# Patient Record
Sex: Male | Born: 1970 | Race: Black or African American | Hispanic: No | Smoking: Former smoker
Health system: Southern US, Community
[De-identification: ages and names within clinical notes are randomized; demographics above are authoritative.]

## PROBLEM LIST (undated history)

## (undated) DIAGNOSIS — D649 Anemia, unspecified: Secondary | ICD-10-CM

## (undated) DIAGNOSIS — E669 Obesity, unspecified: Secondary | ICD-10-CM

## (undated) DIAGNOSIS — Z87442 Personal history of urinary calculi: Secondary | ICD-10-CM

## (undated) DIAGNOSIS — M25559 Pain in unspecified hip: Secondary | ICD-10-CM

## (undated) DIAGNOSIS — K219 Gastro-esophageal reflux disease without esophagitis: Secondary | ICD-10-CM

## (undated) DIAGNOSIS — R7303 Prediabetes: Secondary | ICD-10-CM

## (undated) DIAGNOSIS — I10 Essential (primary) hypertension: Secondary | ICD-10-CM

## (undated) DIAGNOSIS — G4730 Sleep apnea, unspecified: Secondary | ICD-10-CM

## (undated) HISTORY — DX: Essential (primary) hypertension: I10

## (undated) HISTORY — DX: Pain in unspecified hip: M25.559

## (undated) HISTORY — DX: Obesity, unspecified: E66.9

## (undated) HISTORY — DX: Sleep apnea, unspecified: G47.30

## (undated) HISTORY — PX: FOOT SURGERY: SHX648

---

## 1998-06-24 ENCOUNTER — Emergency Department (HOSPITAL_COMMUNITY): Admission: EM | Admit: 1998-06-24 | Discharge: 1998-06-24 | Payer: Self-pay | Admitting: Emergency Medicine

## 1998-06-24 ENCOUNTER — Encounter: Payer: Self-pay | Admitting: Emergency Medicine

## 1998-10-20 ENCOUNTER — Encounter: Payer: Self-pay | Admitting: Internal Medicine

## 1998-10-20 ENCOUNTER — Emergency Department (HOSPITAL_COMMUNITY): Admission: EM | Admit: 1998-10-20 | Discharge: 1998-10-20 | Payer: Self-pay | Admitting: Internal Medicine

## 1999-03-02 ENCOUNTER — Encounter: Payer: Self-pay | Admitting: Emergency Medicine

## 1999-03-02 ENCOUNTER — Emergency Department (HOSPITAL_COMMUNITY): Admission: EM | Admit: 1999-03-02 | Discharge: 1999-03-02 | Payer: Self-pay | Admitting: Emergency Medicine

## 1999-05-17 ENCOUNTER — Emergency Department (HOSPITAL_COMMUNITY): Admission: EM | Admit: 1999-05-17 | Discharge: 1999-05-17 | Payer: Self-pay | Admitting: Emergency Medicine

## 1999-12-29 ENCOUNTER — Emergency Department (HOSPITAL_COMMUNITY): Admission: EM | Admit: 1999-12-29 | Discharge: 1999-12-29 | Payer: Self-pay | Admitting: Emergency Medicine

## 1999-12-29 ENCOUNTER — Encounter: Payer: Self-pay | Admitting: Emergency Medicine

## 2000-08-28 ENCOUNTER — Emergency Department (HOSPITAL_COMMUNITY): Admission: EM | Admit: 2000-08-28 | Discharge: 2000-08-28 | Payer: Self-pay | Admitting: Emergency Medicine

## 2001-04-03 ENCOUNTER — Emergency Department (HOSPITAL_COMMUNITY): Admission: EM | Admit: 2001-04-03 | Discharge: 2001-04-03 | Payer: Self-pay

## 2001-04-09 ENCOUNTER — Encounter: Payer: Self-pay | Admitting: Orthopedic Surgery

## 2001-04-10 ENCOUNTER — Encounter: Payer: Self-pay | Admitting: Orthopedic Surgery

## 2001-04-10 ENCOUNTER — Ambulatory Visit (HOSPITAL_COMMUNITY): Admission: RE | Admit: 2001-04-10 | Discharge: 2001-04-10 | Payer: Self-pay | Admitting: Orthopedic Surgery

## 2001-08-03 ENCOUNTER — Encounter: Payer: Self-pay | Admitting: Emergency Medicine

## 2001-08-03 ENCOUNTER — Emergency Department (HOSPITAL_COMMUNITY): Admission: EM | Admit: 2001-08-03 | Discharge: 2001-08-03 | Payer: Self-pay | Admitting: Emergency Medicine

## 2010-05-04 ENCOUNTER — Emergency Department (HOSPITAL_COMMUNITY): Admission: EM | Admit: 2010-05-04 | Discharge: 2010-05-04 | Payer: Self-pay | Admitting: Emergency Medicine

## 2010-12-02 LAB — CBC
Platelets: 208 10*3/uL (ref 150–400)
RBC: 4.25 MIL/uL (ref 4.22–5.81)
RDW: 13.5 % (ref 11.5–15.5)
WBC: 4.6 10*3/uL (ref 4.0–10.5)

## 2010-12-02 LAB — URINALYSIS, ROUTINE W REFLEX MICROSCOPIC
Bilirubin Urine: NEGATIVE
Glucose, UA: NEGATIVE mg/dL
Hgb urine dipstick: NEGATIVE
Specific Gravity, Urine: 1.021 (ref 1.005–1.030)

## 2010-12-02 LAB — URINE CULTURE

## 2010-12-02 LAB — DIFFERENTIAL
Basophils Relative: 0 % (ref 0–1)
Eosinophils Absolute: 0.1 10*3/uL (ref 0.0–0.7)
Eosinophils Relative: 3 % (ref 0–5)
Monocytes Absolute: 0.6 10*3/uL (ref 0.1–1.0)
Monocytes Relative: 13 % — ABNORMAL HIGH (ref 3–12)
Neutro Abs: 3 10*3/uL (ref 1.7–7.7)

## 2010-12-02 LAB — COMPREHENSIVE METABOLIC PANEL
ALT: 49 U/L (ref 0–53)
AST: 37 U/L (ref 0–37)
Albumin: 3.8 g/dL (ref 3.5–5.2)
Alkaline Phosphatase: 49 U/L (ref 39–117)
Chloride: 111 mEq/L (ref 96–112)
GFR calc Af Amer: >60 mL/min (ref >60)
Potassium: 3.6 mEq/L (ref 3.5–5.1)
Sodium: 142 mEq/L (ref 135–145)
Total Bilirubin: 0.5 mg/dL (ref 0.3–1.2)
Total Protein: 7 g/dL (ref 6.0–8.3)

## 2010-12-02 LAB — URINE MICROSCOPIC-ADD ON

## 2011-02-04 NOTE — Op Note (Signed)
[place]  Patient:    Michael Payne, Michael Payne                       MRN: [id number] Proc. Date: 04/10/01 Adm. Date:  [id number] Disc. Date: [id number] Attending:  Wende Mott                           Operative Report  PREOPERATIVE DIAGNOSIS: Fractured second, third, and fourth metatarsal heads.  POSTOPERATIVE DIAGNOSIS: Fractured second, third, and fourth metatarsal heads.  OPERATION/PROCEDURE: Attempted closed but open reduction and internal fixation of second, third, and fourth metatarsals, right foot.  SURGEON: Kennieth Rad, M.D.  ANESTHESIA: General.  DESCRIPTION OF PROCEDURE: The patient was taken to the operating room after being given adequate preoperative medication and was given general anesthesia and intubated.  The right foot was prepped with DuraPrep and draped in sterile manner.  A tourniquet was used for hemostasis and Bovie.  Manipulative reduction of the metatarsal heads was attempted but stabilizing them was not possible percutaneously due to the patients severely edematous foot plus large size individual.  Open reduction was then done of the second and fourth metatarsals and were held in good position with K-wires.  The third metatarsal maintained its position once the second and fourth were stabilized.  Copious irrigation with antibiotic solution was then done.  Wound closure was then done with skin staples.  A bulky compressive dressing was applied.  The patient was placed back into a Cam walker.  The patient tolerated the procedure quite well and went to the recovery room in stable and satisfactory condition.  The patient was discharged home on Percocet one q.4h p.r.n. pain, ice packs, elevation, nonweightbearing on the right side with the use of crutches, to return to the office in one week.  The patient was discharged in stable and satisfactory condition. DD:  04/26/01 TD:  04/27/01 Job: [id number]

## 2011-02-04 NOTE — H&P (Signed)
[place]  Patient:    Michael Payne, Michael Payne                       MRN: [id number] Adm. Date:  [id number] Disc. Date: [id number] Attending:  Wende Mott                         History and Physical  CHIEF COMPLAINT: Painful right foot.  HISTORY OF PRESENT ILLNESS: This is a 40 year old who had walked into an object several days ago and sustained injury, stumped his right foot.  The patient developed severe pain and discoloration of the right foot.  The patient had x-rays done at [facility]. [facility] and was found to have fractured several metatarsals.  The patient was seen in the office and found to have displaced metatarsal fractures involving the second, third, and fourth toes of the right foot.  PAST MEDICAL HISTORY: No previous operations.  History of high blood pressure.  ALLERGIES: ASPIRIN causes GI upset.  MEDICATIONS:  1. Vicodin.  2. Zestril.  SOCIAL HISTORY: Habits, the patient drinks two beers a day.  Denies smoking.  FAMILY HISTORY: Noncontributory.  REVIEW OF SYSTEMS: The patient has otherwise been healthy.  No cardiac or respiratory, no urinary or bowel symptoms.  PHYSICAL EXAMINATION:  VITAL SIGNS: Temperature 99.6 degrees, pulse 88, respirations 16, blood pressure 139/76.  Height 6 feet 4 inches.  Weight 437 pounds.  HEENT: Normocephalic.  Eyes, conjunctivae and sclerae clear.  NECK: Supple.  CHEST: Clear.  CARDIAC: S1 and S2, regular.  EXTREMITIES: Right foot diffusely swollen, ecchymotic, and tender.  Nail beds pink and blanch.  LABORATORY DATA: X-rays reveal displaced fracture of second, third, and fourth metatarsal heads.  IMPRESSION: Fractured right second, third, and fourth metatarsals of right foot. DD:  04/26/01 TD:  04/27/01 Job: [id number]

## 2012-02-03 ENCOUNTER — Encounter (HOSPITAL_COMMUNITY): Payer: Self-pay | Admitting: Emergency Medicine

## 2012-02-03 ENCOUNTER — Emergency Department (HOSPITAL_COMMUNITY): Payer: Self-pay

## 2012-02-03 ENCOUNTER — Emergency Department (HOSPITAL_COMMUNITY)
Admission: EM | Admit: 2012-02-03 | Discharge: 2012-02-03 | Disposition: A | Discharge status: Home / Self Care | Payer: Self-pay | Attending: Emergency Medicine | Admitting: Emergency Medicine

## 2012-02-03 DIAGNOSIS — S61419A Laceration without foreign body of unspecified hand, initial encounter: Secondary | ICD-10-CM

## 2012-02-03 DIAGNOSIS — S60229A Contusion of unspecified hand, initial encounter: Secondary | ICD-10-CM | POA: Insufficient documentation

## 2012-02-03 DIAGNOSIS — W540XXA Bitten by dog, initial encounter: Secondary | ICD-10-CM | POA: Insufficient documentation

## 2012-02-03 DIAGNOSIS — Y92009 Unspecified place in unspecified non-institutional (private) residence as the place of occurrence of the external cause: Secondary | ICD-10-CM | POA: Insufficient documentation

## 2012-02-03 MED ORDER — AMOXICILLIN-POT CLAVULANATE 875-125 MG PO TABS: 1.0000 | ORAL_TABLET | Freq: Two times a day (BID) | ORAL | Status: AC

## 2012-02-03 MED ORDER — AMOXICILLIN-POT CLAVULANATE 875-125 MG PO TABS
1.0000 | ORAL_TABLET | Freq: Once | ORAL | Status: AC
  Administered 2012-02-03: 1 via ORAL
  Filled 2012-02-03: qty 1

## 2012-02-03 MED ORDER — HYDROCODONE-ACETAMINOPHEN 5-500 MG PO TABS: 1.0000 | ORAL_TABLET | Freq: Four times a day (QID) | ORAL | Status: AC | PRN

## 2012-02-03 MED ORDER — TETANUS-DIPHTH-ACELL PERTUSSIS 5-2.5-18.5 LF-MCG/0.5 IM SUSP
0.5000 mL | Freq: Once | INTRAMUSCULAR | Status: AC
  Administered 2012-02-03: 0.5 mL via INTRAMUSCULAR
  Filled 2012-02-03: qty 0.5

## 2012-02-03 NOTE — ED Provider Notes (Signed)
History     CSN: [id number]  Arrival date & time 02/03/12  1517   First MD Initiated Contact with Patient 02/03/12 1610      Chief Complaint  Patient presents with  . Animal Bite    (Consider location/radiation/quality/duration/timing/severity/associated sxs/prior treatment) Patient is a 41 y.o. male presenting with animal bite. The history is provided by the patient.  Animal Bite  Pertinent negatives include no numbness and no weakness.  pt states this afternoon was bit by his own dog on his right hand. He was playing w animal, playing fetch, animal got excited and bit him. Animal otherwise has been acting at baseline, healthy, alert, playful. The dog has had all of its shots. Pt unsure of his own tetanus. C/o contusion and small < 1 cm lac to right hand. Pain mild, constant, dull, worse w palpation. No numbness/weakness or loss of use or normal function. No fever or chills. No red streaking. No drainage or bleeding from wounds. Pt is right hand dominant.  No other injury or other associated symptoms.   No past medical history on file.  No past surgical history on file.  No family history on file.  History  Substance Use Topics  . Smoking status: Never Smoker   . Smokeless tobacco: Not on file  . Alcohol Use: Yes     Weekends/beer      Review of Systems  Constitutional: Negative for fever and chills.  Skin: Positive for wound.  Neurological: Negative for weakness and numbness.    Allergies  Aspirin  Home Medications   Current Outpatient Rx  Name Route Sig Dispense Refill  . ACETAMINOPHEN 500 MG PO TABS Oral Take 1,000 mg by mouth every 6 (six) hours as needed. pain      BP 156/103  Pulse 77  Temp(Src) 98.6 F (37 C) (Oral)  Resp 20  SpO2 98%  Physical Exam  Nursing note and vitals reviewed. Constitutional: He is oriented to person, place, and time. He appears well-developed and well-nourished. No distress.  HENT:  Head: Atraumatic.  Eyes: Conjunctivae  are normal.  Neck: Neck supple. No tracheal deviation present.  Cardiovascular: Normal rate.   Pulmonary/Chest: Effort normal. No accessory muscle usage. No respiratory distress.  Musculoskeletal: Normal range of motion.       Normal cap refill distally. Radial pulse 2+  Neurological: He is alert and oriented to person, place, and time.       Right hand nvi. Normal rom, normal tendon fx.   Skin: Skin is warm and dry.    ED Course  Procedures (including critical care time)  Dg Hand Complete Right  02/03/2012  *RADIOLOGY REPORT*  Clinical Data: dog bite rule out foreign body  RIGHT HAND - COMPLETE 3+ VIEW  Comparison: None.  Findings: Three views of the right hand submitted.  No acute fracture or subluxation.  No radiopaque foreign body.  IMPRESSION: No acute fracture or subluxation.  No radiopaque foreign body.  Original Report Authenticated By: Natasha Mead, M.D.      MDM  Wound cleaned, sterile dressing.   Confirmed only allergic to asa. augmentin po. Pt verifies md has had its shots/utd. Pt unsure of his last tetanus.   Tetanus im.         Suzi Roots, MD 02/03/12 [phone]

## 2012-02-03 NOTE — Discharge Instructions (Signed)
Elevate hand as much as possible for the next 2-3 days to help with pain/swelling. Icepack/cold to sore area. Keep wounds very clean - wash with soap and water 2x/day. Take antibiotic (augmentin) as prescribed. Take motrin or aleve as need for pain. You may also take vicodin as need for pain. No driving when taking vicodin. Also, do not take tylenol or acetaminophen containing medication when taking vicodin.  Follow up with primary care doctor, or urgent care, in the next 1-2 days for wound check.  Return to ER right away if worse, worsening or severe pain, increased swelling, spreading redness, fevers, pus from wounds, other concern.   Your blood pressure is high today - follow up with primary care doctor for recheck of blood pressure in coming week.       Animal Bite An animal bite can result in a scratch on the skin, deep open cut, puncture of the skin, crush injury, or tearing away of the skin or a body part. Dogs are responsible for most animal bites. Children are bitten more often than adults. An animal bite can range from very mild to more serious. A small bite from your house pet is no cause for alarm. However, some animal bites can become infected or injure a bone or other tissue. You must seek medical care if:  The skin is broken and bleeding does not slow down or stop after 15 minutes.   The puncture is deep and difficult to clean (such as a cat bite).   Pain, warmth, redness, or pus develops around the wound.   The bite is from a stray animal or rodent. There may be a risk of rabies infection.   The bite is from a snake, raccoon, skunk, fox, coyote, or bat. There may be a risk of rabies infection.   The person bitten has a chronic illness such as diabetes, liver disease, or cancer, or the person takes medicine that lowers the immune system.   There is concern about the location and severity of the bite.  It is important to clean and protect an animal bite wound right away to  prevent infection. Follow these steps:  Clean the wound with plenty of water and soap.   Apply an antibiotic cream.   Apply gentle pressure over the wound with a clean towel or gauze to slow or stop bleeding.   Elevate the affected area above the heart to help stop any bleeding.   Seek medical care. Getting medical care within 8 hours of the animal bite leads to the best possible outcome.  DIAGNOSIS  Your caregiver will most likely:  Take a detailed history of the animal and the bite injury.   Perform a wound exam.   Take your medical history.  Blood tests or X-rays may be performed. Sometimes, infected bite wounds are cultured and sent to a lab to identify the infectious bacteria.  TREATMENT  Medical treatment will depend on the location and type of animal bite as well as the patient's medical history. Treatment may include:  Wound care, such as cleaning and flushing the wound with saline solution, bandaging, and elevating the affected area.   Antibiotics.   Tetanus immunization.   Rabies immunization.   Leaving the wound open to heal. This is often done with animal bites, due to the high risk of infection. However, in certain cases, wound closure with stitches, wound adhesive, skin adhesive strips, or staples may be used.  Infected bites that are left untreated may require  intravenous (IV) antibiotics and surgical treatment in the hospital. HOME CARE INSTRUCTIONS  Follow your caregiver's instructions for wound care.   Take all medicines as directed.   If your caregiver prescribes antibiotics, take them as directed. Finish them even if you start to feel better.   Follow up with your caregiver for further exams or immunizations as directed.  You may need a tetanus shot if:  You cannot remember when you had your last tetanus shot.   You have never had a tetanus shot.   The injury broke your skin.  If you get a tetanus shot, your arm may swell, get red, and feel warm  to the touch. This is common and not a problem. If you need a tetanus shot and you choose not to have one, there is a rare chance of getting tetanus. Sickness from tetanus can be serious. SEEK MEDICAL CARE IF:  You notice warmth, redness, soreness, swelling, pus discharge, or a bad smell coming from the wound.   You have a red line on the skin coming from the wound.   You have a fever, chills, or a general ill feeling.   You have nausea or vomiting.   You have continued or worsening pain.   You have trouble moving the injured part.   You have other questions or concerns.  MAKE SURE YOU:  Understand these instructions.   Will watch your condition.   Will get help right away if you are not doing well or get worse.  Document Released: 05/24/2011 Document Revised: 08/25/2011 Document Reviewed: 05/24/2011 [facility] Patient Information 2012 [place], [place].Animal Bite An animal bite can result in a scratch on the skin, deep open cut, puncture of the skin, crush injury, or tearing away of the skin or a body part. Dogs are responsible for most animal bites. Children are bitten more often than adults. An animal bite can range from very mild to more serious. A small bite from your house pet is no cause for alarm. However, some animal bites can become infected or injure a bone or other tissue. You must seek medical care if:  The skin is broken and bleeding does not slow down or stop after 15 minutes.   The puncture is deep and difficult to clean (such as a cat bite).   Pain, warmth, redness, or pus develops around the wound.   The bite is from a stray animal or rodent. There may be a risk of rabies infection.   The bite is from a snake, raccoon, skunk, fox, coyote, or bat. There may be a risk of rabies infection.   The person bitten has a chronic illness such as diabetes, liver disease, or cancer, or the person takes medicine that lowers the immune system.   There is concern about the  location and severity of the bite.  It is important to clean and protect an animal bite wound right away to prevent infection. Follow these steps:  Clean the wound with plenty of water and soap.   Apply an antibiotic cream.   Apply gentle pressure over the wound with a clean towel or gauze to slow or stop bleeding.   Elevate the affected area above the heart to help stop any bleeding.   Seek medical care. Getting medical care within 8 hours of the animal bite leads to the best possible outcome.  DIAGNOSIS  Your caregiver will most likely:  Take a detailed history of the animal and the bite injury.   Perform a  wound exam.   Take your medical history.  Blood tests or X-rays may be performed. Sometimes, infected bite wounds are cultured and sent to a lab to identify the infectious bacteria.  TREATMENT  Medical treatment will depend on the location and type of animal bite as well as the patient's medical history. Treatment may include:  Wound care, such as cleaning and flushing the wound with saline solution, bandaging, and elevating the affected area.   Antibiotics.   Tetanus immunization.   Rabies immunization.   Leaving the wound open to heal. This is often done with animal bites, due to the high risk of infection. However, in certain cases, wound closure with stitches, wound adhesive, skin adhesive strips, or staples may be used.  Infected bites that are left untreated may require intravenous (IV) antibiotics and surgical treatment in the hospital. HOME CARE INSTRUCTIONS  Follow your caregiver's instructions for wound care.   Take all medicines as directed.   If your caregiver prescribes antibiotics, take them as directed. Finish them even if you start to feel better.   Follow up with your caregiver for further exams or immunizations as directed.  You may need a tetanus shot if:  You cannot remember when you had your last tetanus shot.   You have never had a tetanus  shot.   The injury broke your skin.  If you get a tetanus shot, your arm may swell, get red, and feel warm to the touch. This is common and not a problem. If you need a tetanus shot and you choose not to have one, there is a rare chance of getting tetanus. Sickness from tetanus can be serious. SEEK MEDICAL CARE IF:  You notice warmth, redness, soreness, swelling, pus discharge, or a bad smell coming from the wound.   You have a red line on the skin coming from the wound.   You have a fever, chills, or a general ill feeling.   You have nausea or vomiting.   You have continued or worsening pain.   You have trouble moving the injured part.   You have other questions or concerns.  MAKE SURE YOU:  Understand these instructions.   Will watch your condition.   Will get help right away if you are not doing well or get worse.  Document Released: 05/24/2011 Document Revised: 08/25/2011 Document Reviewed: 05/24/2011 [facility] Patient Information 2012 [place], [place].     Laceration Care, Adult A laceration is a cut or lesion that goes through all layers of the skin and into the tissue just beneath the skin. TREATMENT  Some lacerations may not require closure. Some lacerations may not be able to be closed due to an increased risk of infection. It is important to see your caregiver as soon as possible after an injury to minimize the risk of infection and maximize the opportunity for successful closure. If closure is appropriate, pain medicines may be given, if needed. The wound will be cleaned to help prevent infection. Your caregiver will use stitches (sutures), staples, wound glue (adhesive), or skin adhesive strips to repair the laceration. These tools bring the skin edges together to allow for faster healing and a better cosmetic outcome. However, all wounds will heal with a scar. Once the wound has healed, scarring can be minimized by covering the wound with sunscreen during the day for 1  full year. HOME CARE INSTRUCTIONS  For sutures or staples:  Keep the wound clean and dry.   If you were given a  bandage (dressing), you should change it at least once a day. Also, change the dressing if it becomes wet or dirty, or as directed by your caregiver.   Wash the wound with soap and water 2 times a day. Rinse the wound off with water to remove all soap. Pat the wound dry with a clean towel.   After cleaning, apply a thin layer of the antibiotic ointment as recommended by your caregiver. This will help prevent infection and keep the dressing from sticking.   You may shower as usual after the first 24 hours. Do not soak the wound in water until the sutures are removed.   Only take over-the-counter or prescription medicines for pain, discomfort, or fever as directed by your caregiver.   Get your sutures or staples removed as directed by your caregiver.  For skin adhesive strips:  Keep the wound clean and dry.   Do not get the skin adhesive strips wet. You may bathe carefully, using caution to keep the wound dry.   If the wound gets wet, pat it dry with a clean towel.   Skin adhesive strips will fall off on their own. You may trim the strips as the wound heals. Do not remove skin adhesive strips that are still stuck to the wound. They will fall off in time.  For wound adhesive:  You may briefly wet your wound in the shower or bath. Do not soak or scrub the wound. Do not swim. Avoid periods of heavy perspiration until the skin adhesive has fallen off on its own. After showering or bathing, gently pat the wound dry with a clean towel.   Do not apply liquid medicine, cream medicine, or ointment medicine to your wound while the skin adhesive is in place. This may loosen the film before your wound is healed.   If a dressing is placed over the wound, be careful not to apply tape directly over the skin adhesive. This may cause the adhesive to be pulled off before the wound is healed.     Avoid prolonged exposure to sunlight or tanning lamps while the skin adhesive is in place. Exposure to ultraviolet light in the first year will darken the scar.   The skin adhesive will usually remain in place for 5 to 10 days, then naturally fall off the skin. Do not pick at the adhesive film.  You may need a tetanus shot if:  You cannot remember when you had your last tetanus shot.   You have never had a tetanus shot.  If you get a tetanus shot, your arm may swell, get red, and feel warm to the touch. This is common and not a problem. If you need a tetanus shot and you choose not to have one, there is a rare chance of getting tetanus. Sickness from tetanus can be serious. SEEK MEDICAL CARE IF:   You have redness, swelling, or increasing pain in the wound.   You see a red line that goes away from the wound.   You have yellowish-white fluid (pus) coming from the wound.   You have a fever.   You notice a bad smell coming from the wound or dressing.   Your wound breaks open before or after sutures have been removed.   You notice something coming out of the wound such as wood or glass.   Your wound is on your hand or foot and you cannot move a finger or toe.  SEEK IMMEDIATE MEDICAL CARE IF:  Your pain is not controlled with prescribed medicine.   You have severe swelling around the wound causing pain and numbness or a change in color in your arm, hand, leg, or foot.   Your wound splits open and starts bleeding.   You have worsening numbness, weakness, or loss of function of any joint around or beyond the wound.   You develop painful lumps near the wound or on the skin anywhere on your body.  MAKE SURE YOU:   Understand these instructions.   Will watch your condition.   Will get help right away if you are not doing well or get worse.  Document Released: 09/05/2005 Document Revised: 08/25/2011 Document Reviewed: 03/01/2011 [facility] Patient Information 2012 [place],  [place].    Cryotherapy Cryotherapy means treatment with cold. Ice or gel packs can be used to reduce both pain and swelling. Ice is the most helpful within the first 24 to 48 hours after an injury or flareup from overusing a muscle or joint. Sprains, strains, spasms, burning pain, shooting pain, and aches can all be eased with ice. Ice can also be used when recovering from surgery. Ice is effective, has very few side effects, and is safe for most people to use. PRECAUTIONS  Ice is not a safe treatment option for people with:  Raynaud's phenomenon. This is a condition affecting small blood vessels in the extremities. Exposure to cold may cause your problems to return.   Cold hypersensitivity. There are many forms of cold hypersensitivity, including:   Cold urticaria. Red, itchy hives appear on the skin when the tissues begin to warm after being iced.   Cold erythema. This is a red, itchy rash caused by exposure to cold.   Cold hemoglobinuria. Red blood cells break down when the tissues begin to warm after being iced. The hemoglobin that carry oxygen are passed into the urine because they cannot combine with blood proteins fast enough.   Numbness or altered sensitivity in the area being iced.  If you have any of the following conditions, do not use ice until you have discussed cryotherapy with your caregiver:  Heart conditions, such as arrhythmia, angina, or chronic heart disease.   High blood pressure.   Healing wounds or open skin in the area being iced.   Current infections.   Rheumatoid arthritis.   Poor circulation.   Diabetes.  Ice slows the blood flow in the region it is applied. This is beneficial when trying to stop inflamed tissues from spreading irritating chemicals to surrounding tissues. However, if you expose your skin to cold temperatures for too long or without the proper protection, you can damage your skin or nerves. Watch for signs of skin damage due to cold. HOME  CARE INSTRUCTIONS Follow these tips to use ice and cold packs safely.  Place a dry or damp towel between the ice and skin. A damp towel will cool the skin more quickly, so you may need to shorten the time that the ice is used.   For a more rapid response, add gentle compression to the ice.   Ice for no more than 10 to 20 minutes at a time. The bonier the area you are icing, the less time it will take to get the benefits of ice.   Check your skin after 5 minutes to make sure there are no signs of a poor response to cold or skin damage.   Rest 20 minutes or more in between uses.   Once your skin is  numb, you can end your treatment. You can test numbness by very lightly touching your skin. The touch should be so light that you do not see the skin dimple from the pressure of your fingertip. When using ice, most people will feel these normal sensations in this order: cold, burning, aching, and numbness.   Do not use ice on someone who cannot communicate their responses to pain, such as small children or people with dementia.  HOW TO MAKE AN ICE PACK Ice packs are the most common way to use ice therapy. Other methods include ice massage, ice baths, and cryo-sprays. Muscle creams that cause a cold, tingly feeling do not offer the same benefits that ice offers and should not be used as a substitute unless recommended by your caregiver. To make an ice pack, do one of the following:  Place crushed ice or a bag of frozen vegetables in a sealable plastic bag. Squeeze out the excess air. Place this bag inside another plastic bag. Slide the bag into a pillowcase or place a damp towel between your skin and the bag.   Mix 3 parts water with 1 part rubbing alcohol. Freeze the mixture in a sealable plastic bag. When you remove the mixture from the freezer, it will be slushy. Squeeze out the excess air. Place this bag inside another plastic bag. Slide the bag into a pillowcase or place a damp towel between your  skin and the bag.  SEEK MEDICAL CARE IF:  You develop white spots on your skin. This may give the skin a blotchy (mottled) appearance.   Your skin turns blue or pale.   Your skin becomes waxy or hard.   Your swelling gets worse.  MAKE SURE YOU:   Understand these instructions.   Will watch your condition.   Will get help right away if you are not doing well or get worse.  Document Released: 05/02/2011 Document Revised: 08/25/2011 Document Reviewed: 05/02/2011 [facility] Patient Information 2012 [place], [place].     Hypertension As your heart beats, it forces blood through your arteries. This force is your blood pressure. If the pressure is too high, it is called hypertension (HTN) or high blood pressure. HTN is dangerous because you may have it and not know it. High blood pressure may mean that your heart has to work harder to pump blood. Your arteries may be narrow or stiff. The extra work puts you at risk for heart disease, stroke, and other problems.  Blood pressure consists of two numbers, a higher number over a lower, 110/72, for example. It is stated as "110 over 72." The ideal is below 120 for the top number (systolic) and under 80 for the bottom (diastolic). Write down your blood pressure today. You should pay close attention to your blood pressure if you have certain conditions such as:  Heart failure.   Prior heart attack.   Diabetes   Chronic kidney disease.   Prior stroke.   Multiple risk factors for heart disease.  To see if you have HTN, your blood pressure should be measured while you are seated with your arm held at the level of the heart. It should be measured at least twice. A one-time elevated blood pressure reading (especially in the Emergency Department) does not mean that you need treatment. There may be conditions in which the blood pressure is different between your right and left arms. It is important to see your caregiver soon for a recheck. Most people  have essential hypertension  which means that there is not a specific cause. This type of high blood pressure may be lowered by changing lifestyle factors such as:  Stress.   Smoking.   Lack of exercise.   Excessive weight.   Drug/tobacco/alcohol use.   Eating less salt.  Most people do not have symptoms from high blood pressure until it has caused damage to the body. Effective treatment can often prevent, delay or reduce that damage. TREATMENT  When a cause has been identified, treatment for high blood pressure is directed at the cause. There are a large number of medications to treat HTN. These fall into several categories, and your caregiver will help you select the medicines that are best for you. Medications may have side effects. You should review side effects with your caregiver. If your blood pressure stays high after you have made lifestyle changes or started on medicines,   Your medication(s) may need to be changed.   Other problems may need to be addressed.   Be certain you understand your prescriptions, and know how and when to take your medicine.   Be sure to follow up with your caregiver within the time frame advised (usually within two weeks) to have your blood pressure rechecked and to review your medications.   If you are taking more than one medicine to lower your blood pressure, make sure you know how and at what times they should be taken. Taking two medicines at the same time can result in blood pressure that is too low.  SEEK IMMEDIATE MEDICAL CARE IF:  You develop a severe headache, blurred or changing vision, or confusion.   You have unusual weakness or numbness, or a faint feeling.   You have severe chest or abdominal pain, vomiting, or breathing problems.  MAKE SURE YOU:   Understand these instructions.   Will watch your condition.   Will get help right away if you are not doing well or get worse.  Document Released: 09/05/2005 Document Revised:  08/25/2011 Document Reviewed: 04/25/2008 [facility] Patient Information 2012 [place], [place].

## 2012-02-03 NOTE — ED Notes (Signed)
Right hand soaked in betadine soap and water x 10 min

## 2012-02-03 NOTE — ED Notes (Signed)
Bitten by his own dog today on right hand. Swollen and tender now.

## 2012-07-11 ENCOUNTER — Encounter (HOSPITAL_COMMUNITY): Payer: Self-pay | Admitting: Emergency Medicine

## 2012-07-11 ENCOUNTER — Emergency Department (HOSPITAL_COMMUNITY)
Admission: EM | Admit: 2012-07-11 | Discharge: 2012-07-11 | Disposition: A | Discharge status: Home / Self Care | Payer: Self-pay | Attending: Emergency Medicine | Admitting: Emergency Medicine

## 2012-07-11 ENCOUNTER — Emergency Department (HOSPITAL_COMMUNITY): Payer: Self-pay

## 2012-07-11 DIAGNOSIS — Z79899 Other long term (current) drug therapy: Secondary | ICD-10-CM | POA: Insufficient documentation

## 2012-07-11 DIAGNOSIS — R0789 Other chest pain: Secondary | ICD-10-CM | POA: Insufficient documentation

## 2012-07-11 DIAGNOSIS — I10 Essential (primary) hypertension: Secondary | ICD-10-CM | POA: Insufficient documentation

## 2012-07-11 DIAGNOSIS — Z87891 Personal history of nicotine dependence: Secondary | ICD-10-CM | POA: Insufficient documentation

## 2012-07-11 LAB — CBC WITH DIFFERENTIAL/PLATELET
Basophils Relative: 0 % (ref 0–1)
Eosinophils Absolute: 0.3 10*3/uL (ref 0.0–0.7)
Lymphs Abs: 2.3 10*3/uL (ref 0.7–4.0)
MCH: 31.2 pg (ref 26.0–34.0)
Neutro Abs: 4.1 10*3/uL (ref 1.7–7.7)
Neutrophils Relative %: 57 % (ref 43–77)
Platelets: 200 10*3/uL (ref 150–400)
RBC: 4.26 MIL/uL (ref 4.22–5.81)

## 2012-07-11 LAB — POCT I-STAT, CHEM 8
BUN: 13 mg/dL (ref 6–23)
Creatinine, Ser: 1 mg/dL (ref 0.50–1.35)
Glucose, Bld: 95 mg/dL (ref 70–99)
Hemoglobin: 14.3 g/dL (ref 13.0–17.0)
Potassium: 4.3 mEq/L (ref 3.5–5.1)
Sodium: 144 mEq/L (ref 135–145)

## 2012-07-11 LAB — D-DIMER, QUANTITATIVE: D-Dimer, Quant: <0.27 ug/mL-FEU (ref 0.00–0.48)

## 2012-07-11 MED ORDER — MORPHINE SULFATE 4 MG/ML IJ SOLN
6.0000 mg | Freq: Once | INTRAMUSCULAR | Status: AC
  Administered 2012-07-11: 6 mg via INTRAVENOUS
  Filled 2012-07-11: qty 2

## 2012-07-11 MED ORDER — HYDROCODONE-ACETAMINOPHEN 5-325 MG PO TABS: 2.0000 | ORAL_TABLET | ORAL | Status: DC | PRN

## 2012-07-11 MED ORDER — ASPIRIN EC 325 MG PO TBEC
325.0000 mg | DELAYED_RELEASE_TABLET | Freq: Once | ORAL | Status: DC
  Filled 2012-07-11: qty 1

## 2012-07-11 NOTE — ED Provider Notes (Signed)
History     CSN: [id number]  Arrival date & time 07/11/12  7829   First MD Initiated Contact with Patient 07/11/12 [phone]      Chief Complaint  Patient presents with  . Chest Pain    (Consider location/radiation/quality/duration/timing/severity/associated sxs/prior treatment) HPI Complains of left anterior chest pain pleuritic in quality radiating to left arm for one week. Worse with deep inspiration or changing position not by anything. Admits to shortness of breath and sweatiness no nausea no other complaint . No treatment prior to coming here pain moderate to severe present. History reviewed. No pertinent past medical history. Past medical history hypertension History reviewed. No pertinent past surgical history.  History reviewed. No pertinent family history.  History  Substance Use Topics  . Smoking status: Never Smoker   . Smokeless tobacco: Not on file  . Alcohol Use: Yes     Weekends/beer   Social history ex-smoker quit one year ago, denies alcohol admits to marijuana use denies other drug use   Review of Systems  Constitutional: Negative.   HENT: Negative.   Respiratory: Positive for shortness of breath.   Cardiovascular: Positive for chest pain.  Gastrointestinal: Negative.   Musculoskeletal: Negative.   Skin: Negative.   Neurological: Negative.   Hematological: Negative.   Psychiatric/Behavioral: Negative.   All other systems reviewed and are negative.    Allergies  Aspirin  Home Medications   Current Outpatient Rx  Name Route Sig Dispense Refill  . IBUPROFEN 200 MG PO TABS Oral Take 800 mg by mouth every 6 (six) hours as needed. For pain    . LOPERAMIDE HCL 2 MG PO CAPS Oral Take 2-4 mg by mouth 4 (four) times daily as needed.    Marland Kitchen MSM 1000 MG PO TABS Oral Take 1 tablet by mouth daily as needed. For inflammation of the knee      BP 182/116  Pulse 79  Temp 98.1 F (36.7 C) (Oral)  Resp 24  SpO2 100%  Physical Exam  Nursing note and vitals  reviewed. Constitutional: He appears well-developed and well-nourished.  HENT:  Head: Normocephalic and atraumatic.  Eyes: Conjunctivae normal are normal. Pupils are equal, round, and reactive to light.  Neck: Neck supple. No tracheal deviation present. No thyromegaly present.  Cardiovascular: Normal rate and regular rhythm.   No murmur heard. Pulmonary/Chest: Effort normal and breath sounds normal. He exhibits tenderness.       Left chest wall is tender. Pain is  reproducible by forcible abduction of left shoulder  Abdominal: Soft. Bowel sounds are normal. He exhibits no distension. There is no tenderness.       Morbidly obese  Musculoskeletal: Normal range of motion. He exhibits no edema and no tenderness.  Neurological: He is alert. Coordination normal.  Skin: Skin is warm and dry. No rash noted.  Psychiatric: He has a normal mood and affect.    ED Course  Procedures (including critical care time)   Labs Reviewed  D-DIMER, QUANTITATIVE  CBC WITH DIFFERENTIAL   No results found.   No diagnosis found.   Date: 07/11/2012  Rate: 80  Rhythm: normal sinus rhythm  QRS Axis: normal  Intervals: normal  ST/T Wave abnormalities: normal  Conduction Disutrbances:none  Narrative Interpretation:   Old EKG Reviewed: changes noted and PVCs seen on tracing from 04/09/2001 have resolved. No other changes interpreted by me  Chest x-ray reviewed by me 1:20 PM pain much improved after treatment with intravenous morphine Results for orders placed during the hospital  encounter of 07/11/12  D-DIMER, QUANTITATIVE      Component Value Range   D-Dimer, Quant <0.27  0.00 - 0.48 ug/mL-FEU  CBC WITH DIFFERENTIAL      Component Value Range   WBC 7.3  4.0 - 10.5 K/uL   RBC 4.26  4.22 - 5.81 MIL/uL   Hemoglobin 13.3  13.0 - 17.0 g/dL   HCT 62.1  30.8 - 65.7 %   MCV 94.6  78.0 - 100.0 fL   MCH 31.2  26.0 - 34.0 pg   MCHC 33.0  30.0 - 36.0 g/dL   RDW 84.6  96.2 - 95.2 %   Platelets 200  150 -  400 K/uL   Neutrophils Relative 57  43 - 77 %   Neutro Abs 4.1  1.7 - 7.7 K/uL   Lymphocytes Relative 31  12 - 46 %   Lymphs Abs 2.3  0.7 - 4.0 K/uL   Monocytes Relative 8  3 - 12 %   Monocytes Absolute 0.6  0.1 - 1.0 K/uL   Eosinophils Relative 4  0 - 5 %   Eosinophils Absolute 0.3  0.0 - 0.7 K/uL   Basophils Relative 0  0 - 1 %   Basophils Absolute 0.0  0.0 - 0.1 K/uL  POCT I-STAT TROPONIN I      Component Value Range   Troponin i, poc 0.02  0.00 - 0.08 ng/mL   Comment 3           POCT I-STAT, CHEM 8      Component Value Range   Sodium 144  135 - 145 mEq/L   Potassium 4.3  3.5 - 5.1 mEq/L   Chloride 104  96 - 112 mEq/L   BUN 13  6 - 23 mg/dL   Creatinine, Ser 8.41  0.50 - 1.35 mg/dL   Glucose, Bld 95  70 - 99 mg/dL   Calcium, Ion 3.24  4.01 - 1.23 mmol/L   TCO2 24  0 - 100 mmol/L   Hemoglobin 14.3  13.0 - 17.0 g/dL   HCT 02.7  25.3 - 66.4 %  POCT I-STAT TROPONIN I      Component Value Range   Troponin i, poc 0.03  0.00 - 0.08 ng/mL   Comment 3            Dg Chest 2 View  07/11/2012  *RADIOLOGY REPORT*  Clinical Data: Chest pain and short of breath  CHEST - 2 VIEW  Comparison: None.  Findings: Suboptimal study.  The patient is very large and was breathing on the lateral exposure causing blurring.  Mild bibasilar atelectasis.  Negative for pneumonia.  No heart failure or effusion.  IMPRESSION: Mild bibasilar atelectasis.   Original Report Authenticated By: Camelia Phenes, M.D.     MDM  Cardiac risk factors former smoker, family history mother died of MI age 59, hypertension  Pain is felt to be musculoskeletal in etiology based on today's presenting symptoms and exam Patient requires risk stratification Spoke with Dr.Bensihmon Plan prescription Vicodin [facility] will see the patient as an outpatient within the next 2 or 3 days Diagnosis atypical chest pain        Doug Sou, MD 07/11/12 1328

## 2012-07-11 NOTE — ED Notes (Signed)
Pt c/o left sided CP x 1 week intermittently with SOB; pt sts started again this am; pt sts worse with inspiration and movement

## 2012-07-26 ENCOUNTER — Encounter: Payer: Self-pay | Admitting: Cardiovascular Disease

## 2012-07-26 ENCOUNTER — Ambulatory Visit (INDEPENDENT_AMBULATORY_CARE_PROVIDER_SITE_OTHER): Payer: Self-pay | Admitting: Cardiovascular Disease

## 2012-07-26 VITALS — BP 147/93 | HR 77 | Ht 76.0 in | Wt >= 6400 oz

## 2012-07-26 DIAGNOSIS — R079 Chest pain, unspecified: Secondary | ICD-10-CM

## 2012-07-26 MED ORDER — PANTOPRAZOLE SODIUM 40 MG PO TBEC: 40.0000 mg | DELAYED_RELEASE_TABLET | Freq: Every day | ORAL | Status: DC

## 2012-07-26 NOTE — Patient Instructions (Addendum)
Your physician recommends that you schedule a follow-up appointment in:  4 weeks.   Your physician has requested that you have an echocardiogram. Echocardiography is a painless test that uses sound waves to create images of your heart. It provides your doctor with information about the size and shape of your heart and how well your heart's chambers and valves are working. This procedure takes approximately one hour. There are no restrictions for this procedure.   Your physician has recommended you make the following change in your medication:  Start Protonix 40 mg by mouth daily

## 2012-07-26 NOTE — Progress Notes (Signed)
History of Present Illness: 41 yo morbidly obese AAM with history of HTN who is here today for evaluation of chest pain. He was seen in the ED on 07/11/12 for chest pain and had a normal EKG with negative cardiac markers. He tells me that he had a sharp pain across his left chest wall one month ago. This lasts for three to four hours every time he has it. There is associated dizziness with some SOB. No nausea or diaphoresis. This has occurred several times per week for the last month. Never happens during exertion. The chest pain mostly happens at rest. No prior cardiac disease. He did smoke 1-2 ppd for 20 years but stopped smoking in June 2012.  He had been on BP medications in the past but has stopped.   Primary Care Physician: None  Past Medical History  Diagnosis Date  . HTN (hypertension)     Past Surgical History  Procedure Date  . Foot surgery     Current Outpatient Prescriptions  Medication Sig Dispense Refill  . HYDROcodone-acetaminophen (NORCO/VICODIN) 5-325 MG per tablet Take 2 tablets by mouth every 4 (four) hours as needed for pain.  16 tablet  0  . ibuprofen (ADVIL,MOTRIN) 200 MG tablet Take 800 mg by mouth every 6 (six) hours as needed. For pain      . loperamide (IMODIUM) 2 MG capsule Take 2-4 mg by mouth 4 (four) times daily as needed.      . Methylsulfonylmethane (MSM) 1000 MG TABS Take 1 tablet by mouth daily as needed. For inflammation of the knee        Allergies  Allergen Reactions  . Aspirin Other (See Comments)    Upset stomach    History   Social History  . Marital Status: Single    Spouse Name: N/A    Number of Children: 2  . 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 Smoking status: Former Smoker -- 2.0 packs/day for 22 years    Types: Cigarettes    Quit date: 02/24/2011  . Smokeless tobacco: Not on file  . Alcohol Use: Yes     Comment: Weekends/beer  . Drug Use: Yes    Special:  Marijuana  . Sexually Active: Not on file   Other Topics Concern  . Not on file   Social History Narrative  . No narrative on file    Family History  Problem Relation Age of Onset  . Heart attack Mother 65  . Lung cancer Father   . CAD Maternal Grandfather     Review of Systems:  As stated in the HPI and otherwise negative.   BP 147/93  Pulse 77  Ht 6\' 4"  (1.93 m)  Wt 457 lb (207.294 kg)  BMI 55.63 kg/m2  Physical Examination: General: Obese male  NAD HEENT: OP clear, mucus membranes moist SKIN: warm, dry. No rashes. Neuro: No focal deficits Musculoskeletal: Muscle strength 5/5 all ext Psychiatric: Mood and affect normal Neck: No JVD, no carotid bruits, no thyromegaly, no lymphadenopathy. Lungs:Clear bilaterally, no wheezes, rhonci, crackles Cardiovascular: Regular rate and rhythm. No murmurs, gallops or rubs. Abdomen:Soft. Bowel sounds present. Non-tender.  Extremities: No lower extremity edema. Pulses are 2 + in the bilateral DP/PT.  EKG: NSR, non-specific ST and T wave changes.   Assessment and Plan:   1. Chest pain; His chest pain is atypical, occurring only at rest. He does have risk  factors for CAD including tobacco abuse (40 pack years), obesity, FH of CAD, HTN. I will start by getting an echo to assess LV function. Will start Protonix 40 mg po Qdaily and see if this is related to GERD. If he continues to have chest pain over next few weeks, will need to arrange a cardiac cath. He is too large for a stress test to be beneficial. I could perform cath in lab 5 at [facility] which will support 500 lbs.

## 2012-08-03 ENCOUNTER — Ambulatory Visit (HOSPITAL_COMMUNITY): Payer: Self-pay | Attending: Cardiology

## 2012-08-03 DIAGNOSIS — I517 Cardiomegaly: Secondary | ICD-10-CM | POA: Insufficient documentation

## 2012-08-03 DIAGNOSIS — I10 Essential (primary) hypertension: Secondary | ICD-10-CM | POA: Insufficient documentation

## 2012-08-03 DIAGNOSIS — Z87891 Personal history of nicotine dependence: Secondary | ICD-10-CM | POA: Insufficient documentation

## 2012-08-03 DIAGNOSIS — Z8249 Family history of ischemic heart disease and other diseases of the circulatory system: Secondary | ICD-10-CM | POA: Insufficient documentation

## 2012-08-03 DIAGNOSIS — R079 Chest pain, unspecified: Secondary | ICD-10-CM

## 2012-08-03 DIAGNOSIS — R0989 Other specified symptoms and signs involving the circulatory and respiratory systems: Secondary | ICD-10-CM | POA: Insufficient documentation

## 2012-08-03 DIAGNOSIS — R072 Precordial pain: Secondary | ICD-10-CM | POA: Insufficient documentation

## 2012-08-03 DIAGNOSIS — R0609 Other forms of dyspnea: Secondary | ICD-10-CM | POA: Insufficient documentation

## 2012-08-03 NOTE — Progress Notes (Signed)
Echocardiogram performed.  

## 2012-09-06 ENCOUNTER — Ambulatory Visit: Payer: Self-pay | Admitting: Cardiovascular Disease

## 2012-09-27 ENCOUNTER — Ambulatory Visit: Payer: Self-pay | Admitting: Cardiovascular Disease

## 2012-10-19 ENCOUNTER — Encounter: Payer: Self-pay | Admitting: Cardiovascular Disease

## 2012-10-19 ENCOUNTER — Ambulatory Visit (INDEPENDENT_AMBULATORY_CARE_PROVIDER_SITE_OTHER): Payer: Self-pay | Admitting: Cardiovascular Disease

## 2012-10-19 VITALS — BP 116/73 | HR 92 | Ht 76.0 in | Wt >= 6400 oz

## 2012-10-19 DIAGNOSIS — R609 Edema, unspecified: Secondary | ICD-10-CM

## 2012-10-19 DIAGNOSIS — R079 Chest pain, unspecified: Secondary | ICD-10-CM

## 2012-10-19 DIAGNOSIS — R600 Localized edema: Secondary | ICD-10-CM

## 2012-10-19 LAB — BASIC METABOLIC PANEL
CO2: 30 mEq/L (ref 19–32)
Chloride: 103 mEq/L (ref 96–112)
GFR: 104.65 mL/min (ref >60.00)
Glucose, Bld: 122 mg/dL — ABNORMAL HIGH (ref 70–99)
Potassium: 3.4 mEq/L — ABNORMAL LOW (ref 3.5–5.1)
Sodium: 139 mEq/L (ref 135–145)

## 2012-10-19 MED ORDER — FUROSEMIDE 40 MG PO TABS: 40.0000 mg | ORAL_TABLET | Freq: Every day | ORAL | Status: DC | PRN

## 2012-10-19 MED ORDER — FAMOTIDINE 20 MG PO TABS: 20.0000 mg | ORAL_TABLET | Freq: Every day | ORAL | Status: DC

## 2012-10-19 NOTE — Progress Notes (Signed)
History of Present Illness: 42 yo morbidly obese AAM with history of HTN and chest pain who is here today for cardiac follow up. I saw him in November 2013 for evaluation of chest pain. He was seen in the ED on 07/11/12 for chest pain and had a normal EKG with negative cardiac markers. He told me that he had a sharp pain across his left chest wall one month prior to his first visit here. This lasted for three to four hours. There was associated dizziness with some SOB. No nausea or diaphoresis. This occurred several times over the prior week.  Never happens during exertion. The chest pain mostly happens at rest. No prior cardiac disease. He did smoke 1-2 ppd for 20 years but stopped smoking in June 2012. He had been on BP medications in the past but has stopped. I arranged an echo which showed normal LV function with mild dilation of the aortic root. I also asked him to start Protonix for possible GERD.   He is here today for follow up. He did not take the Protonix. His chest pain has mostly resolved. No SOB. Some lower extremity edema at the end of the day.   Primary Care Physician: None  Past Medical History  Diagnosis Date  . HTN (hypertension)     Past Surgical History  Procedure Date  . Foot surgery     Current Outpatient Prescriptions  Medication Sig Dispense Refill  . HYDROcodone-acetaminophen (NORCO/VICODIN) 5-325 MG per tablet Take 2 tablets by mouth every 4 (four) hours as needed for pain.  16 tablet  0  . ibuprofen (ADVIL,MOTRIN) 200 MG tablet Take 800 mg by mouth every 6 (six) hours as needed. For pain      . loperamide (IMODIUM) 2 MG capsule Take 2-4 mg by mouth 4 (four) times daily as needed.      . Methylsulfonylmethane (MSM) 1000 MG TABS Take 1 tablet by mouth daily as needed. For inflammation of the knee      . pantoprazole (PROTONIX) 40 MG tablet Take 1 tablet (40 mg total) by mouth daily.  30 tablet  6    Allergies  Allergen Reactions  . Aspirin Other (See Comments)      Upset stomach    History   Social History  . Marital Status: Single    Spouse Name: N/A    Number of Children: 2  . 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 Smoking status: Former Smoker -- 2.0 packs/day for 22 years    Types: Cigarettes    Quit date: 02/24/2011  . Smokeless tobacco: Not on file  . Alcohol Use: Yes     Comment: Weekends/beer  . Drug Use: Yes    Special: Marijuana  . Sexually Active: Not on file   Other Topics Concern  . Not on file   Social History Narrative  . No narrative on file    Family History  Problem Relation Age of Onset  . Heart attack Mother 48  . Lung cancer Father   . CAD Maternal Grandfather     Review of Systems:  As stated in the HPI and otherwise negative.   BP 116/73  Pulse 92  Ht 6\' 4"  (1.93 m)  Wt 454 lb (205.933 kg)  BMI 55.26 kg/m2  Physical Examination: General: Well developed, well nourished, NAD HEENT: OP clear, mucus membranes moist SKIN: warm, dry. No rashes.  Neuro: No focal deficits Musculoskeletal: Muscle strength 5/5 all ext Psychiatric: Mood and affect normal Neck: No JVD, no carotid bruits, no thyromegaly, no lymphadenopathy. Lungs:Clear bilaterally, no wheezes, rhonci, crackles Cardiovascular: Regular rate and rhythm. No murmurs, gallops or rubs. Abdomen:Soft. Bowel sounds present. Non-tender.  Extremities: Trace bilateral lower extremity edema. Pulses are 2 + in the bilateral DP/PT.  Echo November 2013: Left ventricle: The cavity size was mildly dilated. Wall thickness was increased in a pattern of mild LVH. Systolic function was normal. The estimated ejection fraction was in the range of 55% to 60%. Wall motion was normal; there were no regional wall motion abnormalities. - Aortic root: The aortic root was mildly to moderately dilated. - Ascending aorta: The ascending aorta was mildly dilated. - Left atrium: The atrium was mildly  dilated.  Assessment and Plan:   1. Chest pain:  His chest pain is atypical, occurring only at rest. Chest pain has mostly resolved. He is asked to start Pepcid 20 mg po Qdaily for possible GERD  2. Lower ext edema: Likely dependent edema. Will start Lasix 40 mg po Qdaily as needed per pt request. Check BMET today.   F/U 6 months unless doing well.    will need to arrange a cardiac cath. He is too large for a stress test to be beneficial. I could perform cath in lab 5 at [facility] which will support 500 lbs.

## 2012-10-19 NOTE — Patient Instructions (Addendum)
Your physician wants you to follow-up in:  6 months. You will receive a reminder letter in the mail two months in advance. If you don't receive a letter, please call our office to schedule the follow-up appointment.  Your physician has recommended you make the following change in your medication:  Start Pepcid 20 mg by mouth daily.  You do not need a prescription for this. It can be bought over the counter. Start furosemide 40 mg by mouth daily as needed for swelling.  Eat a banana on the days you take the furosemide.

## 2013-09-23 ENCOUNTER — Ambulatory Visit (INDEPENDENT_AMBULATORY_CARE_PROVIDER_SITE_OTHER): Payer: BC Managed Care – PPO | Admitting: Physician Assistant

## 2013-09-23 VITALS — BP 154/94 | HR 71 | Temp 98.5°F | Resp 18 | Ht 74.5 in | Wt >= 6400 oz

## 2013-09-23 DIAGNOSIS — Z202 Contact with and (suspected) exposure to infections with a predominantly sexual mode of transmission: Secondary | ICD-10-CM

## 2013-09-23 DIAGNOSIS — I10 Essential (primary) hypertension: Secondary | ICD-10-CM

## 2013-09-23 DIAGNOSIS — R631 Polydipsia: Secondary | ICD-10-CM

## 2013-09-23 DIAGNOSIS — Z2089 Contact with and (suspected) exposure to other communicable diseases: Secondary | ICD-10-CM

## 2013-09-23 LAB — COMPREHENSIVE METABOLIC PANEL
ALBUMIN: 4 g/dL (ref 3.5–5.2)
ALT: 30 U/L (ref 0–53)
AST: 20 U/L (ref 0–37)
Alkaline Phosphatase: 60 U/L (ref 39–117)
BUN: 12 mg/dL (ref 6–23)
CALCIUM: 9 mg/dL (ref 8.4–10.5)
CHLORIDE: 101 meq/L (ref 96–112)
CO2: 31 mEq/L (ref 19–32)
CREATININE: 0.87 mg/dL (ref 0.50–1.35)
GLUCOSE: 89 mg/dL (ref 70–99)
POTASSIUM: 4.4 meq/L (ref 3.5–5.3)
Sodium: 139 mEq/L (ref 135–145)
Total Bilirubin: 0.5 mg/dL (ref 0.3–1.2)
Total Protein: 7.2 g/dL (ref 6.0–8.3)

## 2013-09-23 LAB — RPR

## 2013-09-23 LAB — POCT URINALYSIS DIPSTICK
BILIRUBIN UA: NEGATIVE
GLUCOSE UA: NEGATIVE
Ketones, UA: NEGATIVE
Leukocytes, UA: NEGATIVE
NITRITE UA: NEGATIVE
PH UA: 6
Protein, UA: NEGATIVE
SPEC GRAV UA: 1.02
Urobilinogen, UA: 0.2

## 2013-09-23 LAB — POCT CBC
Granulocyte percent: 54.8 %G (ref 37–80)
HEMATOCRIT: 43.2 % — AB (ref 43.5–53.7)
HEMOGLOBIN: 13.1 g/dL — AB (ref 14.1–18.1)
LYMPH, POC: 1.8 (ref 0.6–3.4)
MCH: 30 pg (ref 27–31.2)
MCHC: 30.3 g/dL — AB (ref 31.8–35.4)
MCV: 99.1 fL — AB (ref 80–97)
MID (cbc): 0.5 (ref 0–0.9)
MPV: 8.8 fL (ref 0–99.8)
POC Granulocyte: 2.9 (ref 2–6.9)
POC LYMPH PERCENT: 34.9 %L (ref 10–50)
POC MID %: 10.3 %M (ref 0–12)
Platelet Count, POC: 199 10*3/uL (ref 142–424)
RBC: 4.36 M/uL — AB (ref 4.69–6.13)
RDW, POC: 13.6 %
WBC: 5.3 10*3/uL (ref 4.6–10.2)

## 2013-09-23 LAB — POCT UA - MICROSCOPIC ONLY
BACTERIA, U MICROSCOPIC: NEGATIVE
CASTS, UR, LPF, POC: NEGATIVE
Crystals, Ur, HPF, POC: NEGATIVE
MUCUS UA: NEGATIVE
RBC, urine, microscopic: NEGATIVE
YEAST UA: NEGATIVE

## 2013-09-23 LAB — HIV ANTIBODY (ROUTINE TESTING W REFLEX): HIV: NONREACTIVE

## 2013-09-23 LAB — LIPID PANEL
CHOL/HDL RATIO: 2.9 ratio
Cholesterol: 145 mg/dL (ref 0–200)
HDL: 50 mg/dL (ref >39)
LDL CALC: 73 mg/dL (ref 0–99)
TRIGLYCERIDES: 108 mg/dL (ref <150)
VLDL: 22 mg/dL (ref 0–40)

## 2013-09-23 LAB — GLUCOSE, POCT (MANUAL RESULT ENTRY): POC GLUCOSE: 84 mg/dL (ref 70–99)

## 2013-09-23 LAB — POCT GLYCOSYLATED HEMOGLOBIN (HGB A1C): HEMOGLOBIN A1C: 5.2

## 2013-09-23 MED ORDER — METRONIDAZOLE 500 MG PO TABS: ORAL_TABLET | ORAL | Status: DC

## 2013-09-23 MED ORDER — HYDROCHLOROTHIAZIDE 12.5 MG PO CAPS: 12.5000 mg | ORAL_CAPSULE | ORAL | Status: DC

## 2013-09-23 NOTE — Progress Notes (Signed)
Subjective:    Patient ID: Michael Payne, male    DOB: [birth year], 43 y.o.   MRN: [id number]   HPI 43 year old male here requesting treatment for trichomonas. His wife was diagnosed with this 1 week ago. Patient in stable relationship with her for 6 years and they have been married for 1.5 years. States he has been faithful. States that she has told him that she has been faithful. He was sexually active with a male prior to this relationship and did not always use protection. He has noted some urinary frequency and urinary urgency the past several days. No dysuria. No penile discharge. No lesions along the penis, testicles, or groin. He did have crabs many years ago.   He was previously on antihypertensives around 8 years ago, but was taken off of them when he lost some weight. He has been under increased amounts of stress lately with the above. Some headaches. No chest pain, visual changes, or focal deficits.    PMH: Past Medical History  Diagnosis Date  . HTN (hypertension)    Allergies: Allergies  Allergen Reactions  . Aspirin Other (See Comments)    Upset stomach   History   Social History  . Marital Status: Married    Spouse Name: N/A    Number of Children: 2  . 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 Smoking status: Former Smoker -- 2.00 packs/day for 22 years    Types: Cigarettes    Quit date: 02/24/2011  . Smokeless tobacco: Not on file  . Alcohol Use: Yes     Comment: Weekends/beer  . Drug Use: Yes    Special: Marijuana  . Sexual Activity: Not on file   Other Topics Concern  . Not on file   Social History Narrative  . No narrative on file   Family History  Problem Relation Age of Onset  . Heart attack Mother 35  . Lung cancer Father   . CAD Maternal Grandfather      Review of Systems  Constitutional: Negative for fever and chills.  Cardiovascular: Negative for chest pain.    Gastrointestinal: Negative for nausea, vomiting, abdominal pain, diarrhea and constipation.  Endocrine: Positive for polydipsia and polyuria.       Positive for nocturia.   Genitourinary: Positive for urgency and frequency. Negative for dysuria, hematuria, flank pain, decreased urine volume, discharge, penile swelling, scrotal swelling, enuresis, difficulty urinating, genital sores, penile pain and testicular pain.  Neurological: Positive for headaches.       Objective:   Physical Exam  Physical Exam: Blood pressure 174/118, pulse 71, temperature 98.5 F (36.9 C), temperature source Oral, resp. rate 18, height 6' 2.5" (1.892 m), weight 456 lb (206.84 kg), SpO2 98.00%., Body mass index is 57.78 kg/(m^2). General: Well developed, well nourished, in no acute distress. Head: Normocephalic, atraumatic, eyes without discharge, sclera non-icteric, nares are without discharge. Bilateral auditory canals clear, increased cerumen right side. TM's are without perforation, pearly grey and translucent with reflective cone of light bilaterally. Oral cavity moist, posterior pharynx without exudate, erythema, peritonsillar abscess, or post nasal drip. Uvula midline.   Neck: Supple. No thyromegaly. Full ROM. No lymphadenopathy. Lungs: Clear bilaterally to auscultation without wheezes, rales, or rhonchi. Breathing is unlabored. Heart: RRR with S1 S2. No murmurs, rubs, or gallops appreciated. Abdomen: Soft, non-tender. No CVA TTP bilaterally.  Msk:  Strength and tone normal  for age. Extremities/Skin: Warm and dry. No clubbing or cyanosis. No edema. No rashes or suspicious lesions. Neuro: Alert and oriented X 3. Moves all extremities spontaneously. Gait is normal. CNII-XII grossly in tact. Psych:  Responds to questions appropriately with a normal affect.    Labs: Results for orders placed in visit on 09/23/13  POCT CBC      Result Value Range   WBC 5.3  4.6 - 10.2 K/uL   Lymph, poc 1.8  0.6 - 3.4   POC  LYMPH PERCENT 34.9  10 - 50 %L   MID (cbc) 0.5  0 - 0.9   POC MID % 10.3  0 - 12 %M   POC Granulocyte 2.9  2 - 6.9   Granulocyte percent 54.8  37 - 80 %G   RBC 4.36 (*) 4.69 - 6.13 M/uL   Hemoglobin 13.1 (*) 14.1 - 18.1 g/dL   HCT, POC 66.4 (*) 40.3 - 53.7 %   MCV 99.1 (*) 80 - 97 fL   MCH, POC 30.0  27 - 31.2 pg   MCHC 30.3 (*) 31.8 - 35.4 g/dL   RDW, POC 47.4     Platelet Count, POC 199  142 - 424 K/uL   MPV 8.8  0 - 99.8 fL  POCT UA - MICROSCOPIC ONLY      Result Value Range   WBC, Ur, HPF, POC 0-2     RBC, urine, microscopic neg     Bacteria, U Microscopic neg     Mucus, UA neg     Epithelial cells, urine per micros 0-1     Crystals, Ur, HPF, POC neg     Casts, Ur, LPF, POC neg     Yeast, UA neg    POCT URINALYSIS DIPSTICK      Result Value Range   Color, UA yellow     Clarity, UA clear     Glucose, UA neg     Bilirubin, UA neg     Ketones, UA neg     Spec Grav, UA 1.020     Blood, UA trace-intact     pH, UA 6.0     Protein, UA neg     Urobilinogen, UA 0.2     Nitrite, UA neg     Leukocytes, UA Negative    GLUCOSE, POCT (MANUAL RESULT ENTRY)      Result Value Range   POC Glucose 84  70 - 99 mg/dl  POCT GLYCOSYLATED HEMOGLOBIN (HGB A1C)      Result Value Range   Hemoglobin A1C 5.2     CMP, lipid, TSH, HIV, RPR, HSV 1&2, and uriprobe all pending     Assessment & Plan:  43 year old male with hypertension, obesity, urinary frequency, urinary urgency, and exposure to STD  1) Hypertension -Start HCTZ 12.5 mg 1 po daily #30 RF 1 -Some of this is likely to be stress induced from the above -Healthy diet -Start a walking program -Weight loss -He has a CPE scheduled on 10/14/13  2) Exposure to STD/Urinary frequency/Urinary urgency  -Flagyl 500 mg Take all 4 po at once #4 no RF, do not drink alcohol with this medication  -Discussed trichomonas in detail  -Await remaining labs -Safe sex practices   3) Anemia -Stable -Follow up at CPE   Eula Listen,  PA-C [facility] [address] [place], [place] [address] [phone] 09/23/2013 10:25 AM

## 2013-09-24 LAB — GC/CHLAMYDIA PROBE AMP
CT PROBE, AMP APTIMA: NEGATIVE
GC PROBE AMP APTIMA: NEGATIVE

## 2013-09-24 LAB — TSH: TSH: 2.563 u[IU]/mL (ref 0.350–4.500)

## 2013-09-25 LAB — HSV(HERPES SIMPLEX VRS) I + II AB-IGG
HSV 1 GLYCOPROTEIN G AB, IGG: 11.68 IV — AB
HSV 2 Glycoprotein G Ab, IgG: 0.12 IV

## 2013-10-14 ENCOUNTER — Ambulatory Visit (INDEPENDENT_AMBULATORY_CARE_PROVIDER_SITE_OTHER): Payer: BC Managed Care – PPO | Admitting: Family Medicine

## 2013-10-14 ENCOUNTER — Encounter: Payer: Self-pay | Admitting: Family Medicine

## 2013-10-14 ENCOUNTER — Ambulatory Visit: Payer: BC Managed Care – PPO

## 2013-10-14 DIAGNOSIS — M25561 Pain in right knee: Secondary | ICD-10-CM

## 2013-10-14 DIAGNOSIS — M25569 Pain in unspecified knee: Secondary | ICD-10-CM

## 2013-10-14 DIAGNOSIS — Z125 Encounter for screening for malignant neoplasm of prostate: Secondary | ICD-10-CM

## 2013-10-14 DIAGNOSIS — I10 Essential (primary) hypertension: Secondary | ICD-10-CM

## 2013-10-14 LAB — BASIC METABOLIC PANEL
BUN: 12 mg/dL (ref 6–23)
CALCIUM: 9.3 mg/dL (ref 8.4–10.5)
CHLORIDE: 104 meq/L (ref 96–112)
CO2: 29 mEq/L (ref 19–32)
CREATININE: 1 mg/dL (ref 0.50–1.35)
Glucose, Bld: 94 mg/dL (ref 70–99)
Potassium: 4.6 mEq/L (ref 3.5–5.3)
Sodium: 141 mEq/L (ref 135–145)

## 2013-10-14 LAB — PSA: PSA: 0.44 ng/mL (ref <=4.00)

## 2013-10-14 NOTE — Patient Instructions (Addendum)
Please look into the bariatric program at [facility].  They offer information seminars frequently.  [email or web address] [phone]  We need to treat your blood pressure.  Your high blood pressure and obesity put you at high risk for a heart attack or stroke.  Please try to take your medication at a regular time each day.  Doing so will minimize frequent urination. Please come and see [place] in about 2 weeks to recheck your BP while you are taking your medication.   We will likely need to increase your dose or add a second medication to control your BP.    I will be in touch with the rest of your labs.

## 2013-10-14 NOTE — Progress Notes (Signed)
[facility] [address] [place] [address] [id number] [phone]  Date:  10/14/2013   Name:  Michael Payne   DOB:  [birth year]   MRN:  [id number]  PCP:  No primary provider on file.    Chief Complaint: Annual Exam   History of Present Illness:  STONE SPIRITO Payne is a 43 y.o. very pleasant male patient who presents with the following:  Here today for a CPE.  Seen a couple of weeks ago by Eula Listen, PA-C and started back on BP medication.   He admits he has not been taking his BP medication- has not taken in the last 2 days.  He did not check his BP while he was on the medication.  He states he did not like taking the HCTZ due to increased urination   He was seen about one year ago at [facility] cardiology.  He was supposed to have a cath per Dr. Weldon Picking last note but this never did happen.  Per Sulayman's report he was told that he did not need to have the cath after all.  His mother did die of a "massive heart attack" when she was 40.    He has not had any more CP.   He is aware that his weight is a very big problem.  Deondray has tried diet and exercise.  However so far these measures have not seemed to help. He would be interested in finding out more about bariatric sugery options  Of note he plans to attend truck driving school.  He is aware that he cannot drive until his BP is controlled  A couple of years ago he fell and hurt his right knee.  It has continued to hurt over the last couple of years. He never had an x-ray performed.    Patient Active Problem List   Diagnosis Date Noted  . Morbid obesity 10/14/2013  . HTN (hypertension) 10/14/2013  . Chest pain 07/26/2012    Past Medical History  Diagnosis Date  . HTN (hypertension)     Past Surgical History  Procedure Laterality Date  . Foot surgery      History  Substance Use Topics  . Smoking status: Former Smoker -- 2.00 packs/day for 22 years    Types: Cigarettes    Quit date: 02/24/2011  .  Smokeless tobacco: Not on file  . Alcohol Use: Yes     Comment: Weekends/beer    Family History  Problem Relation Age of Onset  . Heart attack Mother 30  . Lung cancer Father   . CAD Maternal Grandfather     Allergies  Allergen Reactions  . Aspirin Other (See Comments)    Upset stomach    Medication list has been reviewed and updated.  Current Outpatient Prescriptions on File Prior to Visit  Medication Sig Dispense Refill  . hydrochlorothiazide (MICROZIDE) 12.5 MG capsule Take 1 capsule (12.5 mg total) by mouth every morning.  30 capsule  1  . metroNIDAZOLE (FLAGYL) 500 MG tablet Take all 4 tabs at once  4 tablet  0   No current facility-administered medications on file prior to visit.    Review of Systems:  As per HPI- otherwise negative.   Physical Examination: Filed Vitals:   10/14/13 0913  BP: 162/114  Pulse: 74  Temp: 98 F (36.7 C)  Resp: 16   Filed Vitals:   10/14/13 0913  Height: 6' 1.5" (1.867 m)  Weight: 463 lb (210.015 kg)  Body mass index is 60.25 kg/(m^2). Ideal Body Weight: Weight in (lb) to have BMI = 25: 191.7  GEN: WDWN, NAD, Non-toxic, A & O x 3, morbid obesity HEENT: Atraumatic, Normocephalic. Neck supple. No masses, No LAD.  Bilateral TM wnl, oropharynx normal.  PEERL,EOMI.   Ears and Nose: No external deformity. CV: RRR, No M/G/R. No JVD. No thrill. No extra heart sounds. PULM: CTA B, no wheezes, crackles, rhonchi. No retractions. No resp. distress. No accessory muscle use. ABD: S, NT, ND. No rebound. No HSM. EXTR: No c/c/e NEURO Normal gait.  PSYCH: Normally interactive. Conversant. Not depressed or anxious appearing.  Calm demeanor.  Gu: penis hidden by pannus.  No apparent testicular masses.  Normal rectal exam, prostate exam limited by obesity.   Right knee: no apparent effusion, normal flexion and extension. Stable, no heat, swelling or redness  [facility] reading (PRIMARY) by  Dr. Patsy Lager. Right knee: negative  Assessment and  Plan: Morbid obesity  HTN (hypertension) - Plan: Basic metabolic panel  Right knee pain - Plan: DG Knee Complete 4 Views Right  Screening for prostate cancer - Plan: PSA  Explained that his uncontrolled HTN is dangerous to his health.  Encouraged him to take his medication at around the same time every day to reduce increased urination.  Knee pain likely due to obesity.  He does want to lose weight and we hope this will help  See patient instructions for more details.    Signed Abbe Amsterdam, MD

## 2013-11-30 ENCOUNTER — Other Ambulatory Visit: Payer: Self-pay | Admitting: Physician Assistant

## 2014-06-11 ENCOUNTER — Other Ambulatory Visit: Payer: Self-pay | Admitting: Family Medicine

## 2014-07-20 ENCOUNTER — Other Ambulatory Visit: Payer: Self-pay | Admitting: Physician Assistant

## 2014-09-20 ENCOUNTER — Other Ambulatory Visit: Payer: Self-pay | Admitting: Physician Assistant

## 2014-09-22 NOTE — Telephone Encounter (Signed)
Called pt and advised he is overdue for check-up. 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 I told pt that I will give him 2 more weeks RF if he can get in for OV w/in that time, and pt agreed to try to do that.

## 2014-09-30 ENCOUNTER — Emergency Department (HOSPITAL_COMMUNITY): Payer: 59

## 2014-09-30 ENCOUNTER — Emergency Department (HOSPITAL_COMMUNITY)
Admission: EM | Admit: 2014-09-30 | Discharge: 2014-09-30 | Disposition: A | Discharge status: Home / Self Care | Payer: 59 | Attending: Emergency Medicine | Admitting: Emergency Medicine

## 2014-09-30 ENCOUNTER — Encounter (HOSPITAL_COMMUNITY): Payer: Self-pay | Admitting: *Deleted

## 2014-09-30 DIAGNOSIS — Y9289 Other specified places as the place of occurrence of the external cause: Secondary | ICD-10-CM | POA: Insufficient documentation

## 2014-09-30 DIAGNOSIS — X58XXXA Exposure to other specified factors, initial encounter: Secondary | ICD-10-CM | POA: Insufficient documentation

## 2014-09-30 DIAGNOSIS — Y998 Other external cause status: Secondary | ICD-10-CM | POA: Insufficient documentation

## 2014-09-30 DIAGNOSIS — S8992XA Unspecified injury of left lower leg, initial encounter: Secondary | ICD-10-CM | POA: Diagnosis not present

## 2014-09-30 DIAGNOSIS — Z87891 Personal history of nicotine dependence: Secondary | ICD-10-CM | POA: Diagnosis not present

## 2014-09-30 DIAGNOSIS — I10 Essential (primary) hypertension: Secondary | ICD-10-CM | POA: Insufficient documentation

## 2014-09-30 DIAGNOSIS — Y9339 Activity, other involving climbing, rappelling and jumping off: Secondary | ICD-10-CM | POA: Insufficient documentation

## 2014-09-30 DIAGNOSIS — T1490XA Injury, unspecified, initial encounter: Secondary | ICD-10-CM

## 2014-09-30 DIAGNOSIS — M25562 Pain in left knee: Secondary | ICD-10-CM

## 2014-09-30 DIAGNOSIS — Z79899 Other long term (current) drug therapy: Secondary | ICD-10-CM | POA: Insufficient documentation

## 2014-09-30 MED ORDER — HYDROCODONE-ACETAMINOPHEN 5-325 MG PO TABS: 2.0000 | ORAL_TABLET | ORAL | Status: DC | PRN

## 2014-09-30 MED ORDER — HYDROCODONE-ACETAMINOPHEN 5-325 MG PO TABS
2.0000 | ORAL_TABLET | Freq: Once | ORAL | Status: AC
  Administered 2014-09-30: 2 via ORAL
  Filled 2014-09-30: qty 2

## 2014-09-30 NOTE — Progress Notes (Signed)
Orthopedic Tech Progress Note Patient Details:  Michael Payne [birth year] [id number] Applied ACE bandage to Lt. knee.  Pulses, sensation, motion intact before and after application.  Capillary refill less than 2 seconds before and after application. Ortho Devices Type of Ortho Device: Ace wrap Ortho Device/Splint Location: LLE Ortho Device/Splint Interventions: Application   Lesle Chris 09/30/2014, 10:07 PM

## 2014-09-30 NOTE — ED Provider Notes (Signed)
CSN: [id number]     Arrival date & time 09/30/14  1848 History   None    This chart was scribed for non-physician practitioner, Joycie Peek PA-C working with Toy Cookey, MD by Arlan Organ, ED Scribe. This patient was seen in room TR09C/TR09C and the patient's care was started at 8:06 PM.   Chief Complaint  Patient presents with  . Knee Pain   The history is provided by the patient. No language interpreter was used.    HPI Comments: Michael Payne is a 44 y.o. male with a PMHx of HTN who presents to the Emergency Department complaining of constant, moderate, non-radiating L knee pain with associated swelling onset prior to arrival. Pt states he was climbing into a truck when he heard a "pop" in his L knee. Pt states he is unable to bear weight on the L leg secondary to pain. Pain is described as throbbing. Wife also states R leg typically swells at baseline. Discomfort is exacerbated with movement, weight bearing, and ambulation. No alleviating factors at this time. He has not tried any OTC medications or home remedies to help manage symptoms. No recent fever or chills. Pt with known allergy to Aspirin.   Past Medical History  Diagnosis Date  . HTN (hypertension)    Past Surgical History  Procedure Laterality Date  . Foot surgery     Family History  Problem Relation Age of Onset  . Heart attack Mother 40  . Lung cancer Father   . CAD Maternal Grandfather    History  Substance Use Topics  . Smoking status: Former Smoker -- 2.00 packs/day for 22 years    Types: Cigarettes    Quit date: 02/24/2011  . Smokeless tobacco: Not on file  . Alcohol Use: Yes     Comment: Weekends/beer    Review of Systems  Constitutional: Negative for fever and chills.  Cardiovascular: Positive for leg swelling.  Musculoskeletal: Positive for joint swelling and arthralgias.  All other systems reviewed and are negative.     Allergies  Aspirin  Home Medications   Prior to Admission  medications   Medication Sig Start Date End Date Taking? Authorizing Provider  hydrochlorothiazide (MICROZIDE) 12.5 MG capsule Take 1 capsule (12.5 mg total) by mouth daily. NO MORE REFILLS WITHOUT OFFICE VISIT - FINAL NOTICE 09/22/14   Chelle Tessa Lerner, PA-C  HYDROcodone-acetaminophen (NORCO/VICODIN) 5-325 MG per tablet Take 2 tablets by mouth every 4 (four) hours as needed for moderate pain or severe pain. 09/30/14   Sharlene Motts, PA-C  metroNIDAZOLE (FLAGYL) 500 MG tablet Take all 4 tabs at once 09/23/13   Sondra Barges, PA-C   Triage Vitals: BP 144/88 mmHg  Pulse 84  Temp(Src) 98 F (36.7 C)  Resp 18  Ht 6\' 4"  (1.93 m)  Wt 525 lb (238.138 kg)  BMI 63.93 kg/m2  SpO2 92%   Physical Exam  Constitutional: He appears well-developed and well-nourished. No distress.  Morbidly Obese.  HENT:  Head: Normocephalic and atraumatic.  Neck: Neck supple.  Cardiovascular: Normal rate, regular rhythm and normal heart sounds.   Pulmonary/Chest: Effort normal and breath sounds normal.  Musculoskeletal:  Tenderness to L knee medial and lateral joint lines. No tenderness to patella. No overt erythema, warmth or edema to L knee. Maintains ROM of L hip and ankle without discomfort.. Able to move large joints of lower L extremity. DP intact.   Neurological: He is alert.  Able to flex and extend lower extremity against  resistance.  Skin: He is not diaphoretic.  Nursing note and vitals reviewed.   ED Course  Procedures (including critical care time)  DIAGNOSTIC STUDIES: Oxygen Saturation is 92% on RA, adequate by my interpretation.    COORDINATION OF CARE: 8:11 PM-Discussed treatment plan with pt at bedside and pt agreed to plan.     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  FINDINGS: Four views of the  left knee submitted. No acute fracture or subluxation. Minimal spurring of patella. Small joint effusion.  IMPRESSION: No acute fracture or subluxation.  Small joint effusion.   Electronically Signed   By: Natasha Mead M.D.   On: 09/30/2014 20:09     EKG Interpretation None     Meds given in ED:  Medications  HYDROcodone-acetaminophen (NORCO/VICODIN) 5-325 MG per tablet 2 tablet (2 tablets Oral Given 09/30/14 2130)    Discharge Medication List as of 09/30/2014  8:58 PM    START taking these medications   Details  HYDROcodone-acetaminophen (NORCO/VICODIN) 5-325 MG per tablet Take 2 tablets by mouth every 4 (four) hours as needed for moderate pain or severe pain., Starting 09/30/2014, Until Discontinued, Print       Filed Vitals:   09/30/14 1904 09/30/14 2114  BP: 144/88 162/106  Pulse: 84 78  Temp: 98 F (36.7 C) 98 F (36.7 C)  TempSrc:  Oral  Resp: 18 20  Height: 6\' 4"  (1.93 m)   Weight: 525 lb (238.138 kg)   SpO2: 92% 96%    MDM  Vitals stable - WNL -afebrile Pt resting comfortably in ED. PE --physical exam limited by patient discomfort and body habitus. However, patient is able to actively move all large joints in his lower extremity. Distal pulses are intact. Imaging--x-ray of left knee shows no acute fractures, dislocations or other osseous abnormalities. There is a mild effusion  DDX--given mechanism of twisting, subsequent pop, swelling with knee effusion, patient may have a torn meniscus. Low concern for other fracture, dislocation or vascular pathology. Referral given for orthopedics, Ace wraps applied to knee pain medicine for home, instructions for ice therapy given, resource guide given Will DC with Ace wraps for knee as knee immobilizer will not fit.  I discussed all relevant lab findings and imaging results with pt and they verbalized understanding. Discussed f/u with PCP within 48 hrs and return precautions, pt very amenable to plan.  Final diagnoses:  Left  knee pain      I personally performed the services described in this documentation, which was scribed in my presence. The recorded information has been reviewed and is accurate.    [place], PA-C 10/01/14 1127  10/03/14, MD 10/01/14 1231

## 2014-09-30 NOTE — ED Notes (Signed)
Per ortho knee immobilizer will not fit pt.  PA aware.  Ortho at chairside to put ace wrap x 3 on left knee,

## 2014-09-30 NOTE — Discharge Instructions (Signed)
Arthralgia °Your caregiver has diagnosed you as suffering from an arthralgia. Arthralgia means there is pain in a joint. This can come from many reasons including: °· Bruising the joint which causes soreness (inflammation) in the joint. °· Wear and tear on the joints which occur as we grow older (osteoarthritis). °· Overusing the joint. °· Various forms of arthritis. °· Infections of the joint. °Regardless of the cause of pain in your joint, most of these different pains respond to anti-inflammatory drugs and rest. The exception to this is when a joint is infected, and these cases are treated with antibiotics, if it is a bacterial infection. °HOME CARE INSTRUCTIONS  °· Rest the injured area for as long as directed by your caregiver. Then slowly start using the joint as directed by your caregiver and as the pain allows. Crutches as directed may be useful if the ankles, knees or hips are involved. If the knee was splinted or casted, continue use and care as directed. If an stretchy or elastic wrapping bandage has been applied today, it should be removed and re-applied every 3 to 4 hours. It should not be applied tightly, but firmly enough to keep swelling down. Watch toes and feet for swelling, bluish discoloration, coldness, numbness or excessive pain. If any of these problems (symptoms) occur, remove the ace bandage and re-apply more loosely. If these symptoms persist, contact your caregiver or return to this location. °· For the first 24 hours, keep the injured extremity elevated on pillows while lying down. °· Apply ice for 15-20 minutes to the sore joint every couple hours while awake for the first half day. Then 03-04 times per day for the first 48 hours. Put the ice in a plastic bag and place a towel between the bag of ice and your skin. °· Wear any splinting, casting, elastic bandage applications, or slings as instructed. °· Only take over-the-counter or prescription medicines for pain, discomfort, or fever as  directed by your caregiver. Do not use aspirin immediately after the injury unless instructed by your physician. Aspirin can cause increased bleeding and bruising of the tissues. °· If you were given crutches, continue to use them as instructed and do not resume weight bearing on the sore joint until instructed. °Persistent pain and inability to use the sore joint as directed for more than 2 to 3 days are warning signs indicating that you should see a caregiver for a follow-up visit as soon as possible. Initially, a hairline fracture (break in bone) may not be evident on X-rays. Persistent pain and swelling indicate that further evaluation, non-weight bearing or use of the joint (use of crutches or slings as instructed), or further X-rays are indicated. X-rays may sometimes not show a small fracture until a week or 10 days later. Make a follow-up appointment with your own caregiver or one to whom we have referred you. A radiologist (specialist in reading X-rays) may read your X-rays. Make sure you know how you are to obtain your X-ray results. Do not assume everything is normal if you do not hear from us. °SEEK MEDICAL CARE IF: °Bruising, swelling, or pain increases. °SEEK IMMEDIATE MEDICAL CARE IF:  °· Your fingers or toes are numb or blue. °· The pain is not responding to medications and continues to stay the same or get worse. °· The pain in your joint becomes severe. °· You develop a fever over 102° F (38.9° C). °· It becomes impossible to move or use the joint. °MAKE SURE YOU:  °·   Understand these instructions.  Will watch your condition.  Will get help right away if you are not doing well or get worse. Document Released: 09/05/2005 Document Revised: 11/28/2011 Document Reviewed: 04/23/2008 [facility] Patient Information 2015 [place], [place]. This information is not intended to replace advice given to you by your health care provider. Make sure you discuss any questions you have with your health care  provider.   Emergency Department Resource Guide 1) Find a Doctor and Pay Out of Pocket Although you won't have to find out who is covered by your insurance plan, it is a good idea to ask around and get recommendations. You will then need to call the office and see if the doctor you have chosen will accept you as a new patient and what types of options they offer for patients who are self-pay. Some doctors offer discounts or will set up payment plans for their patients who do not have insurance, but you will need to ask so you aren't surprised when you get to your appointment.  2) Contact Your Local Health Department Not all health departments have doctors that can see patients for sick visits, but many do, so it is worth a call to see if yours does. If you don't know where your local health department is, you can check in your phone book. The CDC also has a tool to help you locate your state's health department, and many state websites also have listings of all of their local health departments.  3) Find a Walk-in Clinic If your illness is not likely to be very severe or complicated, you may want to try a walk in clinic. These are popping up all over the country in pharmacies, drugstores, and shopping centers. They're usually staffed by nurse practitioners or physician assistants that have been trained to treat common illnesses and complaints. They're usually fairly quick and inexpensive. However, if you have serious medical issues or chronic medical problems, these are probably not your best option.  No Primary Care Doctor: - Call Health Connect at  [phone] - they can help you locate a primary care doctor that  accepts your insurance, provides certain services, etc. - Physician Referral Service- [phone]  Chronic Pain Problems: Organization         Address  Phone   Notes  [facility]  [phone] Patients need to be referred by their primary care doctor.    Medication Assistance: Organization         Address  Phone   Notes  [facility] Medication [facility] [address] [place], [place] [address] [phone] --Must be a resident of [facility] -- Must have NO insurance coverage whatsoever (no Medicaid/ Medicare, etc.) -- The pt. MUST have a primary care doctor that directs their care regularly and follows them in the community   [facility]  [phone]   [employer]  [phone]    Agencies that provide inexpensive medical care: Organization         Address  Phone   Notes  [facility]  [phone]   [facility]    [phone]   [facility] [address] [place], [place] [address] [phone]   [facility] [address], [place] [phone]   Planned Parenthood    [phone]   [facility]    [phone]   [facility]  [address], [place] Phone:  ([phone], Fax:  [phone] Hours of Operation:  9 am - 6 pm, M-F.  Also accepts Medicaid/Medicare and self-pay.  °[facility] ° [address], [place] Phone: [phone], Fax: [phone]. Hours of Operation:  8:30 am - 5:30 pm, M-F.  Also accepts Medicaid and self-pay.  °[facility] [place] [address], [place] Phone: ([phone]) [phone]   °[facility] [address], [place], [place] (336)[phone], Ext. 123 Mondays & Thursdays: 7-9 AM.  First 15 patients are seen on a first come, first serve basis. °  ° °Medicaid-accepting [place] Providers: ° °Organization         Address  Phone   Notes  °[facility] [address], [place] ([phone]) [phone] Also accepts self-pay patients.  °[facility] [address], [place] ° (336) [phone]   °[facility] [address], [place] ([phone]) [phone]   °[facility] [address] [place] [address], [place] ([phone]) [phone]   °Veita Bland [address], [place]  ° ([phone]) [phone] Only accepts [place] Access Medicaid patients after they have their name applied to their card.  ° °Self-Pay (no insurance) in [place]: ° °Organization         Address  Phone   Notes  °Sickle Cell Patients, [facility] [address], [place] ([phone]) [phone]   °[facility] [address], [place] ([phone]) [phone]   °[facility] ° [address], [place] (336) [phone]   °[facility]/Dr. Osei-Bonsu ° [address] [place] [address], [place] or [address], [place] ([phone]) [phone] Phone number for both [place] and [place] locations is the same.  °[facility] [address], [place] ([phone]) [phone]   °[facility] [address] [place] [address], [place] or [address] ([phone]) [phone] °(336) [phone]   °[facility] [address], [place] (336) [phone], phone; ([phone]) [phone], fax Sees patients 1st and 3rd Saturday of every month.  Must not qualify for public or private insurance (i.e. Medicaid, Medicare, [place] Health Choice, Veterans' Benefits) • Household income should be no more than 200% of the poverty level •The clinic cannot treat you if you are pregnant or think you are pregnant • Sexually transmitted diseases are not treated at the clinic.  ° ° °Dental Care: °Organization         Address  Phone  Notes  °[place] [facility] [address], [place] ([phone]) [phone] Accepts children up to age 21 who are enrolled in Medicaid or [place] Health Choice; pregnant women with a Medicaid card; and children who have applied for Medicaid or [place] Health Choice, but were declined, whose parents can pay a reduced fee at time of service.    °[place] [facility] [place]  [address], [place] (336) [phone] Accepts children up to age 21 who are enrolled in Medicaid or [place] Health Choice; pregnant women with a Medicaid card; and children who have applied for Medicaid or [place] Health Choice, but were declined, whose parents can pay a reduced fee at time of service.  °[facility] ° [address], [place] ([phone])   [phone] Patients are seen by appointment only. Walk-ins are not accepted. [facility] will see patients 18 years of age and older. °Monday - Tuesday (8am-5pm) °Most Wednesdays (8:30-5pm) °$30 per visit, cash only  °[facility] ° [address], [place] ([phone]) [phone] Patients are seen by appointment only. Walk-ins are not accepted. [facility] will see patients 18 years of age and older. °One Wednesday Evening (Monthly: Volunteer Based).  $30 per visit, cash only  °[facility]  ([phone]) [phone] for adults; Children under age 4, call Graduate Pediatric Dentistry at ([phone]. Children aged 4-14, please call [phone] to request a pediatric application. ° Dental services are provided in all areas of dental care including fillings, crowns and bridges, complete and partial dentures, implants, gum treatment, root canals, and extractions. Preventive care is also provided. Treatment is provided to both adults and children. °Patients are selected via a lottery and there is often a waiting list. °  °[facility] [address], °[place] ° (336) 763-8833 [email or web address] °  °[facility] [address], [place], [place] (336)723-1848, Ext. 123 Second and Fourth Thursday of each month, opens at 6:30 AM; Clinic ends at 9 AM.  Patients are seen on a first-come first-served basis, and a limited number are seen during each clinic.  ° °[facility] ° [address], [place], [place] (336)  [phone]   Eligibility Requirements °You must have lived in [place], [place], or [place] counties for at least the last three months. °  You cannot be eligible for state or federal sponsored healthcare insurance, including Veterans Administration, Medicaid, or Medicare. °  You generally cannot be eligible for healthcare insurance through your employer.  °  How to apply: °Eligibility screenings are held every Tuesday and Wednesday afternoon from 1:00 pm until 4:00 pm. You do not need an appointment for the interview!  °[facility] [address], [place], [place] 336-631-2330   °[facility]  336-342-8273   °[facility]  336-703-3100   ° [facility]  336-570-6415   ° °Behavioral Health Resources in the Community: °Intensive Outpatient Programs °Organization         Address  Phone  Notes  °[facility] [address], [place], [place] 336-878-6098   °[facility] [address], [place], [place] 336-832-9800   °[facility] [address], [place], [place] ° 336-882-2125   °[facility] [address],  °[place], [place] 1-800-853-5163 or 336-641-4981   °Substance Abuse Resources °Organization         Address  Phone  Notes  °[facility]  336-882-2125   °[facility]  336-784-9470   °[facility] [place] [facility]  336-285-9073   °[facility]  336-845-3988   °Residential & Outpatient Substance Abuse Program  1-800-659-3381   °Psychological Services °Organization         Address  Phone  Notes  °[facility]  336- 832-9600   °[facility]  336- 378-7881   °[facility] [address], [place] 1-800-853-5163 or 336-641-4981   ° °Mobile Crisis Teams °Organization         Address  Phone  Notes  °[facility], Mobile Crisis Care Unit  1-877-626-1772   °Assertive °[facility] ° [address], [place] 336-834-9664   °Sharon DeEsch [address] [place] [phone]    Self-Help/Support Groups Organization         Address  Phone             Notes  [facility] - variety of support groups  336- I7437963 Call for more information  Narcotics Anonymous (NA), [facility] [address], [employer] [place]  2 meetings at this location   Statistician         Address  Phone  Notes  [facility] [address],    [place]  [phone]   [facility]  [address] [place] [address], [place], [place] [phone]   [facility] [address] [place], [place] [phone] Admissions: 8am-3pm M-F  [facility] [address],    [place], [place] [phone]   [facility] [address] [place], [place], [place] [phone]   [facility] [address]  [place], [place] [phone]   [facility] - Intensive Outpatient [address], [place], [place] [phone]   [facility] ([facility] [address] [place]  [place], [place] 7-342-876-8115 or ([phone])[phone]   [facility] ([facility] [address] [place], [place] 416-384-5364 Accepts Medicaid  [facility] [address],  [place] 6-803-212-2482 Substance Abuse/Addiction Treatment   [facility] Organization         Address  Phone  Notes  [facility]  860-373-2557   Angie Fava, PhD [address] [place], [place]   5070982595 or (734) [phone]   [facility]   [address] [place], [place] (317)[phone]   [facility] [address] [place], [place] [phone] Insurance/Medicaid/sponsorship through [name] and Families [address]                                    [place], [place] (513)[phone]  Therapy/tele-psych/case  [facility] [address] [place], [place] [phone]    Dr. Lolly Mustache  [phone]   [facility]  [employer] [facility] Dept. 1) [address] [place] 2) [address] [place] 3)  [address], [place] [phone]  513-174-2743   [facility] Child Abuse Hotline 661 392 7928 or 810-294-7900 (After Hours)         It is important for you to follow-up with orthopedics for further evaluation and management of your symptoms. Your evaluation in the ED today did not show any acute broken bones, dislocations or vessel injury. Use your knee immobilizer when you are walking, but take it off during sleep. Return to ED for worsening symptoms. Please take your pain medicine as directed

## 2014-09-30 NOTE — ED Notes (Signed)
lef knee pain; states, "swelling." hurt knee when climbing into a truck.

## 2014-10-08 ENCOUNTER — Encounter: Payer: Self-pay | Admitting: Family Medicine

## 2014-10-08 ENCOUNTER — Ambulatory Visit (INDEPENDENT_AMBULATORY_CARE_PROVIDER_SITE_OTHER): Payer: 59 | Admitting: Family Medicine

## 2014-10-08 VITALS — BP 140/110 | HR 97 | Temp 98.3°F | Resp 16 | Ht 76.0 in | Wt >= 6400 oz

## 2014-10-08 DIAGNOSIS — M25562 Pain in left knee: Secondary | ICD-10-CM

## 2014-10-08 DIAGNOSIS — I10 Essential (primary) hypertension: Secondary | ICD-10-CM

## 2014-10-08 DIAGNOSIS — R631 Polydipsia: Secondary | ICD-10-CM

## 2014-10-08 LAB — COMPREHENSIVE METABOLIC PANEL
ALBUMIN: 4.1 g/dL (ref 3.5–5.2)
ALT: 51 U/L (ref 0–53)
AST: 30 U/L (ref 0–37)
Alkaline Phosphatase: 59 U/L (ref 39–117)
BILIRUBIN TOTAL: 0.4 mg/dL (ref 0.2–1.2)
BUN: 13 mg/dL (ref 6–23)
CO2: 30 meq/L (ref 19–32)
Calcium: 9.5 mg/dL (ref 8.4–10.5)
Chloride: 100 mEq/L (ref 96–112)
Creat: 0.98 mg/dL (ref 0.50–1.35)
GLUCOSE: 87 mg/dL (ref 70–99)
POTASSIUM: 4.3 meq/L (ref 3.5–5.3)
Sodium: 141 mEq/L (ref 135–145)
TOTAL PROTEIN: 7.7 g/dL (ref 6.0–8.3)

## 2014-10-08 MED ORDER — MELOXICAM 15 MG PO TABS: 15.0000 mg | ORAL_TABLET | Freq: Every day | ORAL | Status: DC

## 2014-10-08 NOTE — Patient Instructions (Signed)
Avoid all juice, limit starchy foods- potatoes, bread, rice, pasta, crackers, desserts Eat only at the table Fill up on vegetables (cooked or raw), lean protein (chicken, meat, fish, eggs), nuts/peanut butter in small quantity- check the serving size   Type 2 Diabetes Mellitus Type 2 diabetes mellitus, often simply referred to as type 2 diabetes, is a long-lasting (chronic) disease. In type 2 diabetes, the pancreas does not make enough insulin (a hormone), the cells are less responsive to the insulin that is made (insulin resistance), or both. Normally, insulin moves sugars from food into the tissue cells. The tissue cells use the sugars for energy. The lack of insulin or the lack of normal response to insulin causes excess sugars to build up in the blood instead of going into the tissue cells. As a result, high blood sugar (hyperglycemia) develops. The effect of high sugar (glucose) levels can cause many complications. Type 2 diabetes was also previously called adult-onset diabetes, but it can occur at any age.  RISK FACTORS  A person is predisposed to developing type 2 diabetes if someone in the family has the disease and also has one or more of the following primary risk factors:  Overweight.  An inactive lifestyle.  A history of consistently eating high-calorie foods. Maintaining a normal weight and regular physical activity can reduce the chance of developing type 2 diabetes. SYMPTOMS  A person with type 2 diabetes may not show symptoms initially. The symptoms of type 2 diabetes appear slowly. The symptoms include:  Increased thirst (polydipsia).  Increased urination (polyuria).  Increased urination during the night (nocturia).  Weight loss. This weight loss may be rapid.  Frequent, recurring infections.  Tiredness (fatigue).  Weakness.  Vision changes, such as blurred vision.  Fruity smell to your breath.  Abdominal pain.  Nausea or vomiting.  Cuts or bruises which  are slow to heal.  Tingling or numbness in the hands or feet. DIAGNOSIS Type 2 diabetes is frequently not diagnosed until complications of diabetes are present. Type 2 diabetes is diagnosed when symptoms or complications are present and when blood glucose levels are increased. Your blood glucose level may be checked by one or more of the following blood tests:  A fasting blood glucose test. You will not be allowed to eat for at least 8 hours before a blood sample is taken.  A random blood glucose test. Your blood glucose is checked at any time of the day regardless of when you ate.  A hemoglobin A1c blood glucose test. A hemoglobin A1c test provides information about blood glucose control over the previous 3 months.  An oral glucose tolerance test (OGTT). Your blood glucose is measured after you have not eaten (fasted) for 2 hours and then after you drink a glucose-containing beverage. TREATMENT   You may need to take insulin or diabetes medicine daily to keep blood glucose levels in the desired range.  If you use insulin, you may need to adjust the dosage depending on the carbohydrates that you eat with each meal or snack. The treatment goal is to maintain the before meal blood sugar (preprandial glucose) level at 70-130 mg/dL. HOME CARE INSTRUCTIONS   Have your hemoglobin A1c level checked twice a year.  Perform daily blood glucose monitoring as directed by your health care provider.  Monitor urine ketones when you are ill and as directed by your health care provider.  Take your diabetes medicine or insulin as directed by your health care provider to maintain your blood  glucose levels in the desired range.  Never run out of diabetes medicine or insulin. It is needed every day.  If you are using insulin, you may need to adjust the amount of insulin given based on your intake of carbohydrates. Carbohydrates can raise blood glucose levels but need to be included in your diet.  Carbohydrates provide vitamins, minerals, and fiber which are an essential part of a healthy diet. Carbohydrates are found in fruits, vegetables, whole grains, dairy products, legumes, and foods containing added sugars.  Eat healthy foods. You should make an appointment to see a registered dietitian to help you create an eating plan that is right for you.  Lose weight if you are overweight.  Carry a medical alert card or wear your medical alert jewelry.  Carry a 15-gram carbohydrate snack with you at all times to treat low blood glucose (hypoglycemia). Some examples of 15-gram carbohydrate snacks include:  Glucose tablets, 3 or 4.  Glucose gel, 15-gram tube.  Raisins, 2 tablespoons (24 grams).  Jelly beans, 6.  Animal crackers, 8.  Regular pop, 4 ounces (120 mL).  Gummy treats, 9.  Recognize hypoglycemia. Hypoglycemia occurs with blood glucose levels of 70 mg/dL and below. The risk for hypoglycemia increases when fasting or skipping meals, during or after intense exercise, and during sleep. Hypoglycemia symptoms can include:  Tremors or shakes.  Decreased ability to concentrate.  Sweating.  Increased heart rate.  Headache.  Dry mouth.  Hunger.  Irritability.  Anxiety.  Restless sleep.  Altered speech or coordination.  Confusion.  Treat hypoglycemia promptly. If you are alert and able to safely swallow, follow the 15:15 rule:  Take 15-20 grams of rapid-acting glucose or carbohydrate. Rapid-acting options include glucose gel, glucose tablets, or 4 ounces (120 mL) of fruit juice, regular soda, or low-fat milk.  Check your blood glucose level 15 minutes after taking the glucose.  Take 15-20 grams more of glucose if the repeat blood glucose level is still 70 mg/dL or below.  Eat a meal or snack within 1 hour once blood glucose levels return to normal.  Be alert to feeling very thirsty and urinating more frequently than usual, which are early signs of  hyperglycemia. An early awareness of hyperglycemia allows for prompt treatment. Treat hyperglycemia as directed by your health care provider.  Engage in at least 150 minutes of moderate-intensity physical activity a week, spread over at least 3 days of the week or as directed by your health care provider. In addition, you should engage in resistance exercise at least 2 times a week or as directed by your health care provider. Try to spend no more than 90 minutes at one time inactive.  Adjust your medicine and food intake as needed if you start a new exercise or sport.  Follow your sick-day plan anytime you are unable to eat or drink as usual.  Do not use any tobacco products including cigarettes, chewing tobacco, or electronic cigarettes. If you need help quitting, ask your health care provider.  Limit alcohol intake to no more than 1 drink per day for nonpregnant women and 2 drinks per day for men. You should drink alcohol only when you are also eating food. Talk with your health care provider whether alcohol is safe for you. Tell your health care provider if you drink alcohol several times a week.  Keep all follow-up visits as directed by your health care provider. This is important.  Schedule an eye exam soon after the diagnosis of type  2 diabetes and then annually.  Perform daily skin and foot care. Examine your skin and feet daily for cuts, bruises, redness, nail problems, bleeding, blisters, or sores. A foot exam by a health care provider should be done annually.  Brush your teeth and gums at least twice a day and floss at least once a day. Follow up with your dentist regularly.  Share your diabetes management plan with your workplace or school.  Stay up-to-date with immunizations. It is recommended that people with diabetes who are over 63 years old get the pneumonia vaccine. In some cases, two separate shots may be given. Ask your health care provider if your pneumonia vaccination is  up-to-date.  Learn to manage stress.  Obtain ongoing diabetes education and support as needed.  Participate in or seek rehabilitation as needed to maintain or improve independence and quality of life. Request a physical or occupational therapy referral if you are having foot or hand numbness, or difficulties with grooming, dressing, eating, or physical activity. SEEK MEDICAL CARE IF:   You are unable to eat food or drink fluids for more than 6 hours.  You have nausea and vomiting for more than 6 hours.  Your blood glucose level is over 240 mg/dL.  There is a change in mental status.  You develop an additional serious illness.  You have diarrhea for more than 6 hours.  You have been sick or have had a fever for a couple of days and are not getting better.  You have pain during any physical activity.  SEEK IMMEDIATE MEDICAL CARE IF:  You have difficulty breathing.  You have moderate to large ketone levels. MAKE SURE YOU:  Understand these instructions.  Will watch your condition.  Will get help right away if you are not doing well or get worse. Document Released: 09/05/2005 Document Revised: 01/20/2014 Document Reviewed: 04/03/2012 [facility] Patient Information 2015 [place], [place]. This information is not intended to replace advice given to you by your health care provider. Make sure you discuss any questions you have with your health care provider.

## 2014-10-08 NOTE — Progress Notes (Signed)
Subjective:    Patient ID: Michael Payne, male    DOB: 1971/07/12, 44 y.o.   MRN: [id number]  HPI This is a very pleasant 44 yo male who is accompanied by his wife. The patient presents today for follow up of left knee pain and referral to orthopedics. He was seen in the ER 09/30/2014 following climbing into a semi-truck and hearing a popping sound, then experiencing left knee pain. Xray showed small joint effusion and minimal spurring without acute fracture or subluxation. His knee was wrapped in an ace bandage as his leg is too large for an immobilizer. He was referred to ortho, but requires PCP referral.Has been taking ibuprofen 800 mg every 4 hours with little relief. He was given a small quantity of vicodin which helped some. He reports that he is very sensitive to medication.  He has been out of work since the injury. His pain is worse with ambulation and after he sits for long periods of time.   The patient's wife is very concerned about his obesity. 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 He eats a lot of fast food and drinks approximately 32 oz of fruit juice daily. It is not unusual for him to consume an entire bag of potato chips in one sitting. He is interested in bariatric surgery referral. He desires to lose weigh to be around for his son, who is 73 months old.   Review of Systems  Constitutional: Negative for fever and chills.  Respiratory: Positive for shortness of breath (with activity). Negative for cough and wheezing.   Cardiovascular: Positive for leg swelling. Negative for chest pain and palpitations.  Endocrine: Positive for polydipsia, polyphagia and polyuria.  Genitourinary: Negative for dysuria.  Musculoskeletal: Positive for arthralgias.  Neurological: Negative for headaches.      Objective:   Physical Exam  Constitutional: He is oriented  to person, place, and time. He appears well-developed and well-nourished.  Morbidly obese   Cardiovascular: Normal rate, regular rhythm and normal heart sounds.   Pulmonary/Chest: Effort normal and breath sounds normal.  Musculoskeletal: He exhibits edema (trace pretibial ).  Neurological: He is alert and oriented to person, place, and time.  Skin: Skin is warm and dry.  Psychiatric: He has a normal mood and affect. His behavior is normal. Judgment and thought content normal.  Vitals reviewed.   BP 140/110 mmHg  Pulse 97  Temp(Src) 98.3 F (36.8 C) (Oral)  Resp 16  Ht 6\' 4"  (1.93 m)  Wt 505 lb (229.066 kg)  BMI 61.50 kg/m2  SpO2 96% Wt Readings from Last 3 Encounters:  10/08/14 505 lb (229.066 kg)  09/30/14 525 lb (238.138 kg)  10/14/13 463 lb (210.015 kg)       Assessment & Plan:  1. Left knee pain - Ambulatory referral to Orthopedic Surgery - meloxicam (MOBIC) 15 MG tablet; Take 1 tablet (15 mg total) by mouth daily.  Dispense: 30 tablet; Refill: 0 - continue ace wrap for comfort - OOW until cleared by ortho  2. Morbid obesity - Discussed consequences of his obesity, provided guidance regarding decreased intake, avoiding bread/pasta/potatoes/sweets/sugary beverages/juice. Increase lean proteins, vegetables. - Ambulatory referral to General Surgery- bariatric surgery - Comprehensive metabolic panel - TSH - Hemoglobin A1C  3. Essential hypertension - will await lab results before adjusting medication - CBC - Comprehensive metabolic  panel  4. Polydipsia - Comprehensive metabolic panel - Hemoglobin A1C   Emi Belfast, FNP-BC  [facility], [facility]  10/11/2014 11:40 AM

## 2014-10-09 ENCOUNTER — Other Ambulatory Visit: Payer: Self-pay | Admitting: Family Medicine

## 2014-10-09 DIAGNOSIS — I10 Essential (primary) hypertension: Secondary | ICD-10-CM

## 2014-10-09 LAB — CBC
HCT: 41.7 % (ref 39.0–52.0)
HEMOGLOBIN: 13.6 g/dL (ref 13.0–17.0)
MCH: 30 pg (ref 26.0–34.0)
MCHC: 32.6 g/dL (ref 30.0–36.0)
MCV: 92.1 fL (ref 78.0–100.0)
MPV: 10.7 fL (ref 8.6–12.4)
PLATELETS: 239 10*3/uL (ref 150–400)
RBC: 4.53 MIL/uL (ref 4.22–5.81)
RDW: 13.9 % (ref 11.5–15.5)
WBC: 5.9 10*3/uL (ref 4.0–10.5)

## 2014-10-09 LAB — HEMOGLOBIN A1C
Hgb A1c MFr Bld: 5.7 % — ABNORMAL HIGH (ref <5.7)
Mean Plasma Glucose: 117 mg/dL — ABNORMAL HIGH (ref <117)

## 2014-10-09 LAB — TSH: TSH: 1.805 u[IU]/mL (ref 0.350–4.500)

## 2014-10-09 MED ORDER — LISINOPRIL 5 MG PO TABS: 5.0000 mg | ORAL_TABLET | Freq: Every day | ORAL | Status: DC

## 2014-10-11 ENCOUNTER — Other Ambulatory Visit: Payer: Self-pay | Admitting: Physician Assistant

## 2014-10-30 ENCOUNTER — Telehealth: Payer: Self-pay | Admitting: Family Medicine

## 2014-10-30 NOTE — Telephone Encounter (Signed)
I had a telephone conversation with the patient who asked for something different for pain in his knee. Meloxicam seemed to work for several days, then has not been as effective. Ortho gave him Ultram without significant relief. I have suggested that the patient seek pain management from ortho since they are actively treating him for his torn meniscus.

## 2014-11-04 ENCOUNTER — Telehealth: Payer: Self-pay | Admitting: Family Medicine

## 2014-11-04 NOTE — Telephone Encounter (Signed)
Called patient who had a question regarding the requirement of medically supervised weight loss for potential bariatric surgery. I spoke with the nurse coordinator at [facility] who said that the patient needs to return his paperwork and be seen by nutrition- they can make the referral. The patient can then have regular follow up with me to monitor weight loss. Discussed this with the patient who states that he has paperwork that needs completion by me. Encouraged him to bring it by for me to complete.  Patient has appointment for MRI in [facility] 11/12/14 for knee.

## 2014-11-12 ENCOUNTER — Ambulatory Visit: Payer: Self-pay | Admitting: Sports Medicine

## 2014-11-12 ENCOUNTER — Encounter: Payer: Self-pay | Admitting: Family Medicine

## 2014-11-12 ENCOUNTER — Ambulatory Visit (INDEPENDENT_AMBULATORY_CARE_PROVIDER_SITE_OTHER): Payer: 59 | Admitting: Family Medicine

## 2014-11-12 VITALS — BP 139/84 | HR 95 | Temp 98.5°F | Resp 18

## 2014-11-12 DIAGNOSIS — I10 Essential (primary) hypertension: Secondary | ICD-10-CM

## 2014-11-12 NOTE — Progress Notes (Signed)
   Subjective:    Patient ID: Michael Payne, male    DOB: 1971-06-23, 44 y.o.   MRN: [id number]  HPI Patient presents today for follow up of HTN and obesity. Patient brings in paperwork for possible bariatric surgery. He is accompanied by his wife and toddler son. The patient reports he has been watching his diet more carefully and has not had a soda for at least 3 weeks. He reports that his clothes feel looser and his wife thinks he has lost weight. He has decreased junk food and fast food consumption. He eats 1-2 times a day in front of the television and has erratic sleep and eating schedules. This preceded his inability to work. 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   Patient continues to be unable to work due to left knee injury 1/16. He had MRI done earlier today and is waiting to get results from ortho and for next step to be determined.   He is doing well on lisinopril and HCTZ. Denies side effects.   Review of Systems No chest pain, no SOB, no edema, no falls    Objective:   Physical Exam  Constitutional: He is oriented to person, place, and time. He appears well-developed and well-nourished.  Morbidly obese.   HENT:  Head: Normocephalic and atraumatic.  Eyes: Conjunctivae are normal.  Neck: Normal range of motion. Neck supple.  Cardiovascular: Normal rate.   Pulmonary/Chest: Effort normal.  Neurological: He is alert and oriented to person, place, and time.  Psychiatric: He has a normal mood and affect. His behavior is normal. Judgment and thought content normal.  Vitals reviewed. Unable to obtain weight due to limits of office scale. He was 525 pounds 09/30/14 in the ER. He is 6'4". Bmi 63.9  BP 139/84 mmHg  Pulse 95  Temp(Src) 98.5 F (36.9 C) (Oral)  Resp 18  Wt   SpO2 94%     Assessment & Plan:  1. Essential hypertension -Continue current meds  2. Morbid obesity -Patient is  currently undergoing exploration process for bariatric surgery. He has attended an information session and is in the process of completing his paperwork. I have spoken to the nurse coordinator and will help from a PCP standpoint.  - Reviewed patient's dietary changes since his last visit and discussed portion control, continuing to decrease junk foods such as potato chips, eating meals and snacks at regular intervals, avoiding eating in front of the television.  -The patient will be going for nutrition counseling as part of his bariatric surgery work up -Follow up in 6 weeks- patient to keep log for 1 week of everything he eats and drinks   Emi Belfast, FNP-BC  [facility], [facility]  11/13/2014 9:28 PM

## 2014-11-17 ENCOUNTER — Telehealth: Payer: Self-pay | Admitting: Family Medicine

## 2014-11-17 NOTE — Telephone Encounter (Signed)
Called patient regarding letter of medical necessity for consideration of bariatric surgery. I spoke with Ola Spurr who is the Bariatric Office Coordinator, she said it was better to wait on the letter until the patient was further along in the process. Left patient a message regarding this and encouraged him to complete and return his paper work.

## 2014-11-22 ENCOUNTER — Other Ambulatory Visit: Payer: Self-pay | Admitting: Physician Assistant

## 2014-11-25 ENCOUNTER — Other Ambulatory Visit: Payer: Self-pay | Admitting: Physician Assistant

## 2014-12-08 ENCOUNTER — Encounter (HOSPITAL_COMMUNITY)
Admission: RE | Admit: 2014-12-08 | Discharge: 2014-12-08 | Disposition: A | Discharge status: Home / Self Care | Payer: Worker's Compensation | Source: Ambulatory Visit | Attending: Orthopedic Surgery | Admitting: Orthopedic Surgery

## 2014-12-08 ENCOUNTER — Encounter (HOSPITAL_COMMUNITY): Payer: Self-pay

## 2014-12-08 DIAGNOSIS — M2242 Chondromalacia patellae, left knee: Secondary | ICD-10-CM | POA: Diagnosis not present

## 2014-12-08 DIAGNOSIS — I10 Essential (primary) hypertension: Secondary | ICD-10-CM | POA: Diagnosis not present

## 2014-12-08 DIAGNOSIS — D649 Anemia, unspecified: Secondary | ICD-10-CM | POA: Diagnosis not present

## 2014-12-08 DIAGNOSIS — E669 Obesity, unspecified: Secondary | ICD-10-CM | POA: Diagnosis not present

## 2014-12-08 DIAGNOSIS — K219 Gastro-esophageal reflux disease without esophagitis: Secondary | ICD-10-CM | POA: Diagnosis not present

## 2014-12-08 DIAGNOSIS — M23212 Derangement of anterior horn of medial meniscus due to old tear or injury, left knee: Secondary | ICD-10-CM | POA: Diagnosis present

## 2014-12-08 DIAGNOSIS — M23262 Derangement of other lateral meniscus due to old tear or injury, left knee: Secondary | ICD-10-CM | POA: Diagnosis not present

## 2014-12-08 DIAGNOSIS — Z87891 Personal history of nicotine dependence: Secondary | ICD-10-CM | POA: Diagnosis not present

## 2014-12-08 HISTORY — DX: Anemia, unspecified: D64.9

## 2014-12-08 HISTORY — DX: Gastro-esophageal reflux disease without esophagitis: K21.9

## 2014-12-08 LAB — BASIC METABOLIC PANEL
Anion gap: 4 — ABNORMAL LOW (ref 5–15)
BUN: 9 mg/dL (ref 6–23)
CHLORIDE: 108 mmol/L (ref 96–112)
CO2: 30 mmol/L (ref 19–32)
Calcium: 8.8 mg/dL (ref 8.4–10.5)
Creatinine, Ser: 0.87 mg/dL (ref 0.50–1.35)
Glucose, Bld: 123 mg/dL — ABNORMAL HIGH (ref 70–99)
Potassium: 3.8 mmol/L (ref 3.5–5.1)
SODIUM: 142 mmol/L (ref 135–145)

## 2014-12-08 LAB — CBC
HEMATOCRIT: 41.4 % (ref 39.0–52.0)
Hemoglobin: 13.2 g/dL (ref 13.0–17.0)
MCH: 29.9 pg (ref 26.0–34.0)
MCHC: 31.9 g/dL (ref 30.0–36.0)
MCV: 93.7 fL (ref 78.0–100.0)
PLATELETS: 211 10*3/uL (ref 150–400)
RBC: 4.42 MIL/uL (ref 4.22–5.81)
RDW: 13.1 % (ref 11.5–15.5)
WBC: 5.2 10*3/uL (ref 4.0–10.5)

## 2014-12-08 NOTE — Progress Notes (Signed)
   12/08/14 1314  OBSTRUCTIVE SLEEP APNEA  Have you ever been diagnosed with sleep apnea through a sleep study? No  Do you snore loudly (loud enough to be heard through closed doors)?  1  Do you often feel tired, fatigued, or sleepy during the daytime? 0  Has anyone observed you stop breathing during your sleep? 0  Do you have, or are you being treated for high blood pressure? 1  BMI more than 35 kg/m2? 1  Age over 43 years old? 0  Neck circumference greater than 40 cm/16 inches? 1  Gender: 1

## 2014-12-08 NOTE — Progress Notes (Addendum)
Pt states he went to ER for ?chest pains.  Had echo done in 2013 and saw Dr. Sanjuana Kava.  He states he was told its not a cardiac issue.   DA  Has not been back there since. (repeated bp while @ [facility]--  160/94)   Have also instructed him to take his BP med prior to his arrival DOS.  DA

## 2014-12-08 NOTE — H&P (Signed)
  [facility] [address]   [address] [place], [place] [address] [phone] A Division of [facility]  Loreta Ave, M.D.   Robert A. Thurston Hole, M.D.   Burnell Blanks, M.D.   Eulas Post, M.D.   Lunette Stands, M.D Jewel Baize. Eulah Pont, M.D.  Buford Dresser, M.D.  Estell Harpin, M.D.    Melina Fiddler, M.D. Janalee Dane, PA- C  Mary L. Dub Mikes, PA-C  Kirstin A. Shepperson, PA-C  Josh Chualar, PA-C  [place], [place]   RE: Michael, Payne                                [id number]      DOB: 01-Oct-1970 PROGRESS NOTE: 11-17-14 SUBJECTIVE: Michael Payne returns in follow up.  I had a lengthy discussion with he and his wife as to the results of the MRI, left knee.  Unfortunately it does show a large significant medial meniscus tear.  A paucity of underlying degenerative changes.  His medical history is significant for obesity.  He continues to be profoundly symptomatic with his left knee.  Popping and catching with intermittent swelling.  He is here to discuss MRI results and delineate further therapeutic options available to him based on these MRI findings.   OBJECTIVE: Exam of the left knee again shows a trace effusion.  Discreet tenderness over the medial joint line.  Positive medial McMurray.  Full motion.  Stable knee.  He is otherwise neurovascularly intact distally.  Calf is soft.    X-RAYS: None repeated.  MRI as noted above.    ASSESSMENT: Left knee medial meniscus tear.  PLAN: I had a lengthy discussion with he and his wife as to these MRI findings.  With his continued symptoms and his ongoing level of functional disability, my recommendation is to proceed with left knee arthroscopy.  Surgical consultation with Dr. Eulah Pont.  Diagnosis confirmed.  Left knee arthroscopy will be arranged at his earliest convenience.  Risks, benefits, potential complications, post-op care, recovery and rehab all discussed at length  with him.  He understands.  Post-op follow up with Dr. Eulah Pont.  In the interim he is out of work and will be cleared to return to work by Dr. Eulah Pont.  Estell Harpin, M.D.   Electronically verified by Estell Harpin, M.D. JSK:[id number] Cc: Worker's Comp. D 11-18-14 T 11-19-14

## 2014-12-08 NOTE — Pre-Procedure Instructions (Signed)
Michael Payne  12/08/2014   Your procedure is scheduled on: Tuesday, March 29th   Report to [facility] at 8:15 AM.   Call this number if you have problems the morning of surgery: [phone]   Remember:   Do not eat food or drink liquids after midnight Monday.   Take these medicines the morning of surgery with A SIP OF WATER: pain medication   Do not wear jewelry - no rings or watches.  Do not wear lotions or colognes.  You may NOT wear deodorant the day of surgery.   Men may shave face and neck.   Do not bring valuables to the hospital.  [facility] is not responsible for any belongings or valuables.               Contacts, dentures or bridgework may not be worn into surgery.  Leave suitcase in the car. After surgery it may be brought to your room.  For patients admitted to the hospital, discharge time is determined by your treatment team.               Patients discharged the day of surgery will not be allowed to drive home, and will need someone to stay with you for the first 24 hrs after surgery.   Name and phone number of your driver:    Special Instructions: "Preparing for Surgery' instruction sheet.   Please read over the following fact sheets that you were given: Pain Booklet, Coughing and Deep Breathing and Surgical Site Infection Prevention

## 2014-12-08 NOTE — Progress Notes (Signed)
Anesthesia PAT Evaluation:  Patient is a 44 year old male scheduled for left knee arthroscopy with medial menisectomy on 12/16/14 by Dr. Margarita Rana.  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  Recently he heard a pop in his knee when he was climbing into his truck.    History includes HTN, former smoker, GERD, anemia, foot surgery's '90's, morbid obesity. PCP is Olean Ree, FNP with [facility] (notes in Epic).    He is not routinely followed by cardiology but saw Dr. Clifton James in late 2013 for atypical chest pain. According to  07/26/12 notes, "I will start by getting an echo to assess LV function. Will start Protonix 40 mg po Qdaily and see if this is related to GERD. If he continues to have chest pain over next few weeks, will need to arrange a cardiac cath. He is too large for a stress test to be beneficial. I could perform cath in lab 5 at [facility] which will support 500 lbs."He did an echo (see below) and saw patient in follow-up on 10/19/12 and note indicates that his chest pain has mostly resolved on Protonix.  Six month follow-up was planned. Based on his discharge instructions from that date, I do not see that cardiac cath was planned (only discussed as a possibility on 07/26/12 note if symptoms didn't resolve).  Today, patient denied any further chest pain.  No SOB or edema.  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  No regular exercise.  He has started to initial process at [facility] for bariatric surgery evaluation.  He does have a family history of early CAD in his mother (deceased of MI at age 83; he is not aware of any of her pre-existing risk factors).  His father died of cancer.   Meds: Norco, lisinopril 5 mg daily.  PAT VITALS: BP 171/107 (recheck 160/94; patient reported he had not taken lisinopril yet this morning), RR 22, O2 99%, HR 80. WT 237.231 kg. I told RN to go ahead and have patient take  his lisinopril on the morning of surgery.   12/08/14 EKG: NSR, cannot rule out inferior infarct (age undetermined).  Overall, I think his EKG is stable when compared to 07/11/12.   08/03/12 Echo: - Left ventricle: The cavity size was mildly dilated. Wall thickness was increased in a pattern of mild LVH. Systolic function was normal. The estimated ejection fraction was in the range of 55% to 60%. Wall motion was normal; there were no regional wall motion abnormalities. - Aortic root: The aortic root was mildly to moderately dilated (45 mm). - Ascending aorta: The ascending aorta was mildly dilated. - Left atrium: The atrium was mildly dilated.  Preoperative labs noted.   I discussed with anesthesiologist Dr. Chaney Malling. He reviewed both of Dr. Gibson Ramp notes as well and patient instructions from his 10/19/12 visit that do not indicate any recommendation to proceed with cath.  Patient denies any further chest pain since he last saw cardiology. If no acute changes then it is anticipated that he can proceed as planned.  Patient reports that Dr. Eulah Pont is planning for surgery to be out-patient. He denied OSA history and reports he lives at home with his wife.  I did discuss that depending on how he did in recovery would ultimately determine if he would need an overnight stay.  Shonna Chock, PA-C [facility] Short Stay  Center/Anesthesiology Phone [phone] 12/08/2014 5:15 PM

## 2014-12-15 MED ORDER — DEXTROSE 5 % IV SOLN
3.0000 g | INTRAVENOUS | Status: AC
  Administered 2014-12-16: 3 g via INTRAVENOUS
  Filled 2014-12-15: qty 3000

## 2014-12-15 MED ORDER — CHLORHEXIDINE GLUCONATE 4 % EX LIQD
60.0000 mL | Freq: Once | CUTANEOUS | Status: DC
Start: 2014-12-15 — End: 2014-12-16
  Filled 2014-12-15: qty 60

## 2014-12-15 MED ORDER — ACETAMINOPHEN 500 MG PO TABS: 1000.0000 mg | ORAL_TABLET | Freq: Once | ORAL | Status: DC

## 2014-12-15 MED ORDER — POTASSIUM CHLORIDE IN NACL 20-0.45 MEQ/L-% IV SOLN
INTRAVENOUS | Status: DC
Start: 2014-12-15 — End: 2014-12-16
  Filled 2014-12-15 (×3): qty 1000

## 2014-12-16 ENCOUNTER — Ambulatory Visit (HOSPITAL_COMMUNITY): Payer: Worker's Compensation | Admitting: Vascular Surgery

## 2014-12-16 ENCOUNTER — Ambulatory Visit (HOSPITAL_COMMUNITY): Payer: Worker's Compensation | Admitting: Certified Registered Nurse Anesthetist

## 2014-12-16 ENCOUNTER — Ambulatory Visit (HOSPITAL_COMMUNITY)
Admission: RE | Admit: 2014-12-16 | Discharge: 2014-12-16 | Disposition: A | Discharge status: Home / Self Care | Payer: Worker's Compensation | Source: Ambulatory Visit | Attending: Orthopedic Surgery | Admitting: Orthopedic Surgery

## 2014-12-16 ENCOUNTER — Encounter (HOSPITAL_COMMUNITY)
Admission: RE | Disposition: A | Discharge status: Home / Self Care | Payer: Self-pay | Source: Ambulatory Visit | Attending: Orthopedic Surgery

## 2014-12-16 ENCOUNTER — Encounter (HOSPITAL_COMMUNITY): Payer: Self-pay | Admitting: Certified Registered Nurse Anesthetist

## 2014-12-16 DIAGNOSIS — Z87891 Personal history of nicotine dependence: Secondary | ICD-10-CM | POA: Diagnosis not present

## 2014-12-16 DIAGNOSIS — K219 Gastro-esophageal reflux disease without esophagitis: Secondary | ICD-10-CM | POA: Insufficient documentation

## 2014-12-16 DIAGNOSIS — M23262 Derangement of other lateral meniscus due to old tear or injury, left knee: Secondary | ICD-10-CM | POA: Diagnosis not present

## 2014-12-16 DIAGNOSIS — M2242 Chondromalacia patellae, left knee: Secondary | ICD-10-CM | POA: Insufficient documentation

## 2014-12-16 DIAGNOSIS — M23212 Derangement of anterior horn of medial meniscus due to old tear or injury, left knee: Secondary | ICD-10-CM | POA: Insufficient documentation

## 2014-12-16 DIAGNOSIS — Z9889 Other specified postprocedural states: Secondary | ICD-10-CM

## 2014-12-16 DIAGNOSIS — D649 Anemia, unspecified: Secondary | ICD-10-CM | POA: Insufficient documentation

## 2014-12-16 DIAGNOSIS — E669 Obesity, unspecified: Secondary | ICD-10-CM | POA: Insufficient documentation

## 2014-12-16 DIAGNOSIS — I10 Essential (primary) hypertension: Secondary | ICD-10-CM | POA: Insufficient documentation

## 2014-12-16 HISTORY — PX: KNEE ARTHROSCOPY WITH MEDIAL MENISECTOMY: SHX5651

## 2014-12-16 SURGERY — ARTHROSCOPY, KNEE, WITH MEDIAL MENISCECTOMY
Anesthesia: General | Laterality: Left

## 2014-12-16 MED ORDER — ONDANSETRON HCL 4 MG/2ML IJ SOLN
INTRAMUSCULAR | Status: DC | PRN
  Administered 2014-12-16: 4 mg via INTRAVENOUS

## 2014-12-16 MED ORDER — PROPOFOL 10 MG/ML IV BOLUS
INTRAVENOUS | Status: AC
  Filled 2014-12-16: qty 20

## 2014-12-16 MED ORDER — OXYMETAZOLINE HCL 0.05 % NA SOLN
NASAL | Status: DC | PRN
  Administered 2014-12-16: 2 via NASAL

## 2014-12-16 MED ORDER — BUPIVACAINE-EPINEPHRINE 0.5% -1:200000 IJ SOLN: INTRAMUSCULAR | Status: DC | PRN

## 2014-12-16 MED ORDER — LIDOCAINE HCL (CARDIAC) 20 MG/ML IV SOLN
INTRAVENOUS | Status: DC | PRN
  Administered 2014-12-16: 100 mg via INTRAVENOUS

## 2014-12-16 MED ORDER — MIDAZOLAM HCL 5 MG/5ML IJ SOLN
INTRAMUSCULAR | Status: DC | PRN
  Administered 2014-12-16: 2 mg via INTRAVENOUS

## 2014-12-16 MED ORDER — ARTIFICIAL TEARS OP OINT
TOPICAL_OINTMENT | OPHTHALMIC | Status: DC | PRN
  Administered 2014-12-16: 1 via OPHTHALMIC

## 2014-12-16 MED ORDER — PROPOFOL 10 MG/ML IV BOLUS
INTRAVENOUS | Status: DC | PRN
  Administered 2014-12-16: 50 mg via INTRAVENOUS
  Administered 2014-12-16: 400 mg via INTRAVENOUS

## 2014-12-16 MED ORDER — ONDANSETRON HCL 4 MG/2ML IJ SOLN: 4.0000 mg | Freq: Once | INTRAMUSCULAR | Status: DC | PRN

## 2014-12-16 MED ORDER — LIDOCAINE HCL (CARDIAC) 20 MG/ML IV SOLN
INTRAVENOUS | Status: AC
  Filled 2014-12-16: qty 5

## 2014-12-16 MED ORDER — SODIUM CHLORIDE 0.9 % IR SOLN
Status: DC | PRN
  Administered 2014-12-16 (×2): 3000 mL

## 2014-12-16 MED ORDER — HYDROMORPHONE HCL 1 MG/ML IJ SOLN
0.2500 mg | INTRAMUSCULAR | Status: DC | PRN
  Administered 2014-12-16: 0.5 mg via INTRAVENOUS

## 2014-12-16 MED ORDER — ONDANSETRON HCL 4 MG/2ML IJ SOLN
INTRAMUSCULAR | Status: AC
  Filled 2014-12-16: qty 2

## 2014-12-16 MED ORDER — METHYLPREDNISOLONE ACETATE 80 MG/ML IJ SUSP
INTRAMUSCULAR | Status: AC
  Filled 2014-12-16: qty 1

## 2014-12-16 MED ORDER — FENTANYL CITRATE 0.05 MG/ML IJ SOLN
INTRAMUSCULAR | Status: DC | PRN
  Administered 2014-12-16 (×3): 50 ug via INTRAVENOUS
  Administered 2014-12-16: 100 ug via INTRAVENOUS

## 2014-12-16 MED ORDER — GLYCOPYRROLATE 0.2 MG/ML IJ SOLN
INTRAMUSCULAR | Status: AC
  Filled 2014-12-16: qty 3

## 2014-12-16 MED ORDER — LACTATED RINGERS IV SOLN
INTRAVENOUS | Status: DC | PRN
  Administered 2014-12-16 (×2): via INTRAVENOUS

## 2014-12-16 MED ORDER — ROCURONIUM BROMIDE 50 MG/5ML IV SOLN
INTRAVENOUS | Status: AC
  Filled 2014-12-16: qty 1

## 2014-12-16 MED ORDER — ARTIFICIAL TEARS OP OINT
TOPICAL_OINTMENT | OPHTHALMIC | Status: AC
  Filled 2014-12-16: qty 3.5

## 2014-12-16 MED ORDER — HYDROMORPHONE HCL 1 MG/ML IJ SOLN
INTRAMUSCULAR | Status: AC
  Filled 2014-12-16: qty 1

## 2014-12-16 MED ORDER — DOCUSATE SODIUM 100 MG PO CAPS: 100.0000 mg | ORAL_CAPSULE | Freq: Two times a day (BID) | ORAL | Status: DC

## 2014-12-16 MED ORDER — LACTATED RINGERS IV SOLN
INTRAVENOUS | Status: DC
  Administered 2014-12-16: 09:00:00 via INTRAVENOUS

## 2014-12-16 MED ORDER — ONDANSETRON HCL 4 MG PO TABS: 4.0000 mg | ORAL_TABLET | Freq: Three times a day (TID) | ORAL | Status: DC | PRN

## 2014-12-16 MED ORDER — OXYCODONE-ACETAMINOPHEN 5-325 MG PO TABS: 1.0000 | ORAL_TABLET | ORAL | Status: DC | PRN

## 2014-12-16 MED ORDER — SUCCINYLCHOLINE CHLORIDE 20 MG/ML IJ SOLN
INTRAMUSCULAR | Status: DC | PRN
  Administered 2014-12-16: 140 mg via INTRAVENOUS

## 2014-12-16 MED ORDER — LIDOCAINE HCL 4 % MT SOLN
OROMUCOSAL | Status: DC | PRN
  Administered 2014-12-16: 4 mL via TOPICAL

## 2014-12-16 MED ORDER — METHYLPREDNISOLONE ACETATE 80 MG/ML IJ SUSP: INTRAMUSCULAR | Status: DC | PRN

## 2014-12-16 MED ORDER — ASPIRIN 325 MG PO TABS: 325.0000 mg | ORAL_TABLET | Freq: Every day | ORAL | Status: DC

## 2014-12-16 MED ORDER — FENTANYL CITRATE 0.05 MG/ML IJ SOLN
INTRAMUSCULAR | Status: AC
  Filled 2014-12-16: qty 5

## 2014-12-16 MED ORDER — OXYMETAZOLINE HCL 0.05 % NA SOLN
NASAL | Status: AC
  Filled 2014-12-16: qty 15

## 2014-12-16 MED ORDER — NEOSTIGMINE METHYLSULFATE 10 MG/10ML IV SOLN
INTRAVENOUS | Status: AC
  Filled 2014-12-16: qty 1

## 2014-12-16 MED ORDER — MIDAZOLAM HCL 2 MG/2ML IJ SOLN
INTRAMUSCULAR | Status: AC
  Filled 2014-12-16: qty 2

## 2014-12-16 MED ORDER — 0.9 % SODIUM CHLORIDE (POUR BTL) OPTIME
TOPICAL | Status: DC | PRN
  Administered 2014-12-16: 1000 mL

## 2014-12-16 SURGICAL SUPPLY — 45 items
BANDAGE ELASTIC 6 VELCRO ST LF (GAUZE/BANDAGES/DRESSINGS) IMPLANT
BANDAGE ESMARK 6X9 LF (GAUZE/BANDAGES/DRESSINGS) IMPLANT
BLADE CUTTER GATOR 3.5 (BLADE) ×3 IMPLANT
BLADE SURG 11 STRL SS (BLADE) ×3 IMPLANT
BLADE SURG ROTATE 9660 (MISCELLANEOUS) IMPLANT
BNDG ELASTIC 6X15 VLCR STRL LF (GAUZE/BANDAGES/DRESSINGS) ×3 IMPLANT
BNDG ESMARK 6X9 LF (GAUZE/BANDAGES/DRESSINGS)
BOOTCOVER CLEANROOM LRG (PROTECTIVE WEAR) IMPLANT
COVER SURGICAL LIGHT HANDLE (MISCELLANEOUS) ×3 IMPLANT
CUFF TOURNIQUET SINGLE 34IN LL (TOURNIQUET CUFF) IMPLANT
CUTTER MENISCUS 3.5MM 6/BX (BLADE) IMPLANT
DRAPE ARTHROSCOPY W/POUCH 114 (DRAPES) ×3 IMPLANT
DRAPE U-SHAPE 47X51 STRL (DRAPES) ×3 IMPLANT
DRSG ADAPTIC 3X8 NADH LF (GAUZE/BANDAGES/DRESSINGS) ×3 IMPLANT
DRSG PAD ABDOMINAL 8X10 ST (GAUZE/BANDAGES/DRESSINGS) ×3 IMPLANT
FACESHIELD WRAPAROUND (MASK) IMPLANT
GAUZE SPONGE 4X4 12PLY STRL (GAUZE/BANDAGES/DRESSINGS) ×3 IMPLANT
GAUZE XEROFORM 1X8 LF (GAUZE/BANDAGES/DRESSINGS) ×3 IMPLANT
GLOVE BIO SURGEON STRL SZ7.5 (GLOVE) ×3 IMPLANT
GLOVE BIOGEL PI IND STRL 8 (GLOVE) ×1 IMPLANT
GLOVE BIOGEL PI INDICATOR 8 (GLOVE) ×2
GOWN STRL REUS W/ TWL LRG LVL3 (GOWN DISPOSABLE) ×3 IMPLANT
GOWN STRL REUS W/ TWL XL LVL3 (GOWN DISPOSABLE) ×1 IMPLANT
GOWN STRL REUS W/TWL 2XL LVL3 (GOWN DISPOSABLE) IMPLANT
GOWN STRL REUS W/TWL LRG LVL3 (GOWN DISPOSABLE) ×6
GOWN STRL REUS W/TWL XL LVL3 (GOWN DISPOSABLE) ×2
IMMOBILIZER KNEE 24 THIGH 36 (MISCELLANEOUS) ×1 IMPLANT
IMMOBILIZER KNEE 24 UNIV (MISCELLANEOUS) ×3
KIT ROOM TURNOVER OR (KITS) ×3 IMPLANT
MANIFOLD NEPTUNE II (INSTRUMENTS) IMPLANT
NEEDLE 22X1 1/2 (OR ONLY) (NEEDLE) IMPLANT
NEEDLE SPNL 18GX3.5 QUINCKE PK (NEEDLE) IMPLANT
NS IRRIG 1000ML POUR BTL (IV SOLUTION) ×3 IMPLANT
PACK ARTHROSCOPY DSU (CUSTOM PROCEDURE TRAY) ×3 IMPLANT
PAD ARMBOARD 7.5X6 YLW CONV (MISCELLANEOUS) ×6 IMPLANT
PADDING CAST COTTON 6X4 STRL (CAST SUPPLIES) IMPLANT
SET ARTHROSCOPY TUBING (MISCELLANEOUS) ×2
SET ARTHROSCOPY TUBING LN (MISCELLANEOUS) ×1 IMPLANT
SPONGE LAP 4X18 X RAY DECT (DISPOSABLE) ×3 IMPLANT
SUT ETHILON 2 0 FS 18 (SUTURE) ×3 IMPLANT
TOWEL OR 17X24 6PK STRL BLUE (TOWEL DISPOSABLE) ×3 IMPLANT
TOWEL OR 17X26 10 PK STRL BLUE (TOWEL DISPOSABLE) ×3 IMPLANT
TUBE CONNECTING 12'X1/4 (SUCTIONS) ×1
TUBE CONNECTING 12X1/4 (SUCTIONS) ×2 IMPLANT
WATER STERILE IRR 1000ML POUR (IV SOLUTION) IMPLANT

## 2014-12-16 NOTE — Progress Notes (Signed)
Per [name] case mgr, pt needs to get home crutches or walker from his workman comp. Case Mgr. Per this being a workmans comp case.  Michael Payne has contacted the [employer] comp case mgr. and Is waiting for a call back regarding the crutches or walker. Due to this, the Crutches or walker will be delivered to patients home. Wife and patient verbalize understanding of this. They have no further questions. Hospital case managers number was given to the patient in case any questions or concerns come up once they get home. And patient's information was given to [place] to directly contact them when equipment is approved.

## 2014-12-16 NOTE — Anesthesia Procedure Notes (Signed)
Procedure Name: Intubation Date/Time: 12/16/2014 10:24 AM Performed by: Willeen Cass P Pre-anesthesia Checklist: Patient identified, Timeout performed, Emergency Drugs available, Suction available and Patient being monitored Patient Re-evaluated:Patient Re-evaluated prior to inductionOxygen Delivery Method: Circle system utilized Preoxygenation: Pre-oxygenation with 100% oxygen Intubation Type: IV induction and Rapid sequence Laryngoscope Size: Mac and 4 Grade View: Grade I Tube type: Oral Tube size: 7.5 mm Number of attempts: 1 Airway Equipment and Method: Stylet,  LTA kit utilized and Bite block Placement Confirmation: ETT inserted through vocal cords under direct vision,  breath sounds checked- equal and bilateral and positive ETCO2 Secured at: 23 cm Tube secured with: Tape Dental Injury: Teeth and Oropharynx as per pre-operative assessment  Comments: 10 degrees Rev. Trendelenburg position for intubation.

## 2014-12-16 NOTE — Transfer of Care (Signed)
Immediate Anesthesia Transfer of Care Note  Patient: Michael Payne  Procedure(s) Performed: Procedure(s): KNEE ARTHROSCOPY WITH MEDIAL MENISECTOMY (Left)  Patient Location: PACU  Anesthesia Type:General  Level of Consciousness: awake, alert , oriented and patient cooperative  Airway & Oxygen Therapy: Patient Spontanous Breathing and Patient connected to nasal cannula oxygen  Post-op Assessment: Report given to RN, Post -op Vital signs reviewed and stable and Patient moving all extremities  Post vital signs: Reviewed and stable  Last Vitals:  Filed Vitals:   12/16/14 0823  BP: 170/95  Pulse: 72  Temp: 36.8 C  Resp: 20    Complications: No apparent anesthesia complications

## 2014-12-16 NOTE — Discharge Instructions (Signed)
DVT prophylaxis: continue ASA 325mg  daily for 3 weeks  Dressing:  You may change your dressing 3-5 days after surgery.  Then change the dressing daily with sterile gauze dressing.    Shower:  May shower but keep the wounds dry, use an occlusive plastic wrap, NO SOAKING IN TUB.  If the bandage gets wet, change with a clean dry gauze.  Weight Bearing: weight bearing as tolerated in the left leg, in knee immobilizer  Activity:  Increase activity slowly as tolerated, but follow the weight bearing instructions above.   To prevent constipation: you may use a stool softener such as :  Colace (over the counter) 100 mg by mouth twice a day  Drink plenty of fluids (prune juice may be helpful) and high fiber foods Miralax (over the counter) for constipation as needed.    Precautions:  If you experience chest pain or shortness of breath - call 911 immediately for transfer to the hospital emergency department!!  If you develop a fever greater that 101 F, purulent drainage from wound, increased redness or drainage from wound, or calf pain -- Call the office at [phone]                                                Follow- Up Appointment:  Please call for an appointment to be seen in 2 weeks [place] - [phone]

## 2014-12-16 NOTE — Anesthesia Preprocedure Evaluation (Addendum)
Anesthesia Evaluation  Patient identified by MRN, date of birth, ID band Patient awake    Reviewed: Allergy & Precautions, NPO status , Patient's Chart, lab work & pertinent test results  History of Anesthesia Complications Negative for: history of anesthetic complications  Airway Mallampati: II  TM Distance: >3 FB Neck ROM: Full    Dental  (+) Teeth Intact, Dental Advisory Given, Missing   Pulmonary former smoker,    Pulmonary exam normal       Cardiovascular hypertension, Pt. on medications Rhythm:Regular Rate:Normal     Neuro/Psych    GI/Hepatic negative GI ROS, Neg liver ROS, GERD-  ,  Endo/Other  negative endocrine ROSMorbid obesity  Renal/GU negative Renal ROS     Musculoskeletal   Abdominal   Peds  Hematology negative hematology ROS (+) anemia ,   Anesthesia Other Findings   Reproductive/Obstetrics                          Anesthesia Physical Anesthesia Plan  ASA: III  Anesthesia Plan: General   Post-op Pain Management:    Induction: Intravenous  Airway Management Planned: Oral ETT  Additional Equipment:   Intra-op Plan:   Post-operative Plan: Extubation in OR  Informed Consent: I have reviewed the patients History and Physical, chart, labs and discussed the procedure including the risks, benefits and alternatives for the proposed anesthesia with the patient or authorized representative who has indicated his/her understanding and acceptance.   Dental advisory given  Plan Discussed with: CRNA, Anesthesiologist and Surgeon  Anesthesia Plan Comments:        Anesthesia Quick Evaluation

## 2014-12-16 NOTE — Anesthesia Postprocedure Evaluation (Signed)
Anesthesia Post Note  Patient: Michael Payne  Procedure(s) Performed: Procedure(s) (LRB): KNEE ARTHROSCOPY WITH MEDIAL MENISECTOMY (Left)  Anesthesia type: General  Patient location: PACU  Post pain: Pain level controlled  Post assessment: Post-op Vital signs reviewed  Last Vitals: BP 166/94 mmHg  Pulse 64  Temp(Src) 36.7 C (Oral)  Resp 18  Wt 523 lb (237.231 kg)  SpO2 93%  Post vital signs: Reviewed  Level of consciousness: sedated  Complications: No apparent anesthesia complications

## 2014-12-16 NOTE — Interval H&P Note (Signed)
History and Physical Interval Note:  12/16/2014 7:15 AM  Michael Payne  has presented today for surgery, with the diagnosis of MEDIAL MENISCUS TEAR  The various methods of treatment have been discussed with the patient and family. After consideration of risks, benefits and other options for treatment, the patient has consented to  Procedure(s): KNEE ARTHROSCOPY WITH MEDIAL MENISECTOMY (Left) as a surgical intervention .  The patient's history has been reviewed, patient examined, no change in status, stable for surgery.  I have reviewed the patient's chart and labs.  Questions were answered to the patient's satisfaction.     Parrie Rasco D

## 2014-12-16 NOTE — Op Note (Signed)
12/16/2014  11:07 AM  PATIENT:  Michael Payne    PRE-OPERATIVE DIAGNOSIS:  MEDIAL MENISCUS TEAR  POST-OPERATIVE DIAGNOSIS:  Same  PROCEDURE:  KNEE ARTHROSCOPY WITH MEDIAL MENISECTOMY  SURGEON:  MURPHY, Jewel Baize, MD  ASSISTANT: Janalee Dane, PA-C, She was present and scrubbed throughout the case, critical for completion in a timely fashion, and for retraction, instrumentation, and closure.   ANESTHESIA:   General  BLOOD LOSS: min  COMPLICATIONS: None   PREOPERATIVE INDICATIONS:  [name] is a  44 y.o. male with a diagnosis of MEDIAL MENISCUS TEAR who failed conservative measures and elected for surgical management.    The risks benefits and alternatives were discussed with the patient preoperatively including but not limited to the risks of infection, bleeding, nerve injury, cardiopulmonary complications, the need for revision surgery, among others, and the patient was willing to proceed.  OPERATIVE IMPLANTS: none  OPERATIVE FINDINGS: Examination under anesthesia: stable knee ligament exam Diagnostic Arthroscopy:  articular cartilage:Grade 2 chondromalacia PF, lat and medial Medial meniscus: posterior radial tear with longitudenal component Lateral meniscus:small radial tear Anterior cruciate ligament/PCL: intact Loose bodies: none    OPERATIVE PROCEDURE:  Patient was identified in the preoperative holding area and site was marked by me male was transported to the operating theater and placed on the table in supine position taking care to pad all bony prominences. After a preincinduction time out anesthesia was induced.  male received ancef for preoperative antibiotics. The left lower extremity was prepped and draped in normal sterile fashion and a pre-incision timeout was performed.   A small stab incision was made in the anterolateral portal position. The arthroscope was introduced in the joint. A medial portal was then established under direct visualization just  above the anterior horn of the medial meniscus. Diagnostic arthroscopy was then carried out with findings as described above.  I performed a chondroplasty of the patellofemoral space primarily on the patella.  I turned my attention to the lateral joint space where I performed a chondroplasty here as well and debrided small fraying of the lateral meniscus.  I then turned my attention medially chondroplasty was performed of the tibial plateau cartilage. At the posterior root there was a radial tear with a longitudinal component I debrided all loose and thus it was not displaceable into the not into the joint. The root was stable. Once I debrided it back to a stable rim.  I then removed all arthroscopic equipment expressed the fluid  The arthroscopic equipment was removed from the joint and the portals were closed with 3-0 nylon in an interrupted fashion. The knee was infiltrated with depomedrol 80mg  and 10cc of 1/2 percent marcain.  Sterile dressings were then applied including Xeroform 4 x 4's ABDs an ACE bandage.  The patient was then allowed to awaken from general anesthesia, transferred to the stretcher and taken to the recovery room in stable condition.  POSTOPERATIVE PLAN: The patient will be discharged home today and will followup in one week for suture removal and wound check.  VTE prophylaxis: ambulation and chemical px   This note was generated using a template and dragon dictation system. In light of that, I have reviewed the note and all aspects of it are applicable to this case. Any dictation errors are due to the computerized dictation system.

## 2014-12-16 NOTE — Discharge Planning (Addendum)
NCM consulted to obtain bariatric walker or crutches for pt.  Pt has workman's [employer] and has to go through [employer] representing his claim.   Pt CM for workman's comp: Daisy Lazar [phone]; claim [phone] NCM attempted to reach Birdie Hopes out of office until tomorrow; rerouted to [place] [phone] and left message for her to call me back.  Pt wife, [name] [phone]) aware of awaiting return call from workman's comp CM. Will inform pt of delivery of DME asap.

## 2014-12-16 NOTE — OR Nursing (Signed)
Late entry:  Dressing was applied to operative site before 0.5% Marcaine w/ Epi and 80 mg Depo-Medrol was given.  Medication to be given on floor per Lindwood Qua, PA-C.

## 2014-12-17 ENCOUNTER — Telehealth: Payer: Self-pay | Admitting: *Deleted

## 2014-12-17 ENCOUNTER — Encounter (HOSPITAL_COMMUNITY): Payer: Self-pay | Admitting: Orthopedic Surgery

## 2014-12-17 NOTE — Telephone Encounter (Signed)
NCM obtained number from Workerman's Comp CM Isaiah Serge) to order DME for pt.  Isaiah Serge provided [phone] ([employer]).  NCM called number and spoke with Francisco.  Francisco suggested Workman's Comp CM call because pt case was unclear.  NCM called Sondra back to relay message.  NCM also called pt wife, [name] to update her on progress of case.

## 2014-12-22 ENCOUNTER — Telehealth: Payer: Self-pay | Admitting: *Deleted

## 2014-12-22 NOTE — Telephone Encounter (Signed)
Faxed MD order, ICD code and facesheet to Worker's comp as requested. [phone] Ref [id number]

## 2014-12-22 NOTE — Telephone Encounter (Signed)
Request for physical address and phone number of patient.

## 2014-12-23 ENCOUNTER — Ambulatory Visit: Payer: 59 | Admitting: Family Medicine

## 2014-12-28 ENCOUNTER — Other Ambulatory Visit: Payer: Self-pay | Admitting: Physician Assistant

## 2015-02-18 ENCOUNTER — Encounter: Payer: Self-pay | Admitting: Family Medicine

## 2015-02-18 ENCOUNTER — Ambulatory Visit (INDEPENDENT_AMBULATORY_CARE_PROVIDER_SITE_OTHER): Payer: 59 | Admitting: Family Medicine

## 2015-02-18 VITALS — BP 135/95 | HR 83 | Temp 98.2°F | Resp 18 | Ht 74.0 in | Wt >= 6400 oz

## 2015-02-18 DIAGNOSIS — I10 Essential (primary) hypertension: Secondary | ICD-10-CM | POA: Diagnosis not present

## 2015-02-18 DIAGNOSIS — Z125 Encounter for screening for malignant neoplasm of prostate: Secondary | ICD-10-CM

## 2015-02-18 DIAGNOSIS — Z0000 Encounter for general adult medical examination without abnormal findings: Secondary | ICD-10-CM

## 2015-02-18 LAB — LIPID PANEL
CHOL/HDL RATIO: 3.7 ratio
Cholesterol: 144 mg/dL (ref 0–200)
HDL: 39 mg/dL — AB (ref >=40)
LDL Cholesterol: 87 mg/dL (ref 0–99)
Triglycerides: 90 mg/dL (ref <150)
VLDL: 18 mg/dL (ref 0–40)

## 2015-02-18 LAB — HEMOGLOBIN A1C
Hgb A1c MFr Bld: 5.6 % (ref <5.7)
Mean Plasma Glucose: 114 mg/dL (ref <117)

## 2015-02-18 MED ORDER — LISINOPRIL 5 MG PO TABS: 5.0000 mg | ORAL_TABLET | Freq: Every day | ORAL | Status: DC

## 2015-02-18 NOTE — Progress Notes (Signed)
Subjective:    Patient ID: Michael Payne, male    DOB: 05/29/1971, 44 y.o.   MRN: [id number]  HPI Patient presents today for CPE. He had knee surgery 2 months ago and is still in PT. He is out of work for 4 more weeks. He has been watching his diet, avoiding fried foods, juicing with vegetables and fruit and estimates he has lost 15 pounds. He intends [facility]. Has been busy with knee PT. His wife [name] is pregnant. They also have a 2 yo son.   Last CPE- several years PSA- 1/15 Colonoscopy- NA  Tdap- 5/13 Dental- not for a long time Eye- annual Exercise- some indoor walking along with PT  Past Medical History  Diagnosis Date  . HTN (hypertension)   . GERD (gastroesophageal reflux disease)     lactose intolerance  . Anemia     borderline anemia, was instructed to eat more iron filled foods   Past Surgical History  Procedure Laterality Date  . Foot surgery    . Knee arthroscopy with medial menisectomy Left 12/16/2014    Procedure: KNEE ARTHROSCOPY WITH MEDIAL MENISECTOMY;  Surgeon: Sheral Apley, MD;  Location: [facility] OR;  Service: Orthopedics;  Laterality: Left;   Family History  Problem Relation Age of Onset  . Heart attack Mother 70  . Lung cancer Father   . CAD Maternal Grandfather    History  Substance Use Topics  . Smoking status: Former Smoker -- 2.00 packs/day for 22 years    Types: Cigarettes    Quit date: 02/24/2011  . Smokeless tobacco: Not on file  . Alcohol Use: Yes     Comment: Weekends/beer-- has slowed down  alot   Review of Systems  Constitutional: Negative.   HENT: Negative.   Eyes: Negative.   Respiratory: Negative.   Cardiovascular: Negative.   Gastrointestinal: Negative.   Endocrine: Negative.   Genitourinary: Negative.   Musculoskeletal: Negative.   Skin: Negative.   Allergic/Immunologic: Negative.   Neurological: Negative.   Hematological: Negative.   Psychiatric/Behavioral: Negative.         Objective:   Physical Exam  Constitutional: He is oriented to person, place, and time. He appears well-developed and well-nourished. No distress.  Morbidly obese.   HENT:  Head: Normocephalic and atraumatic.  Right Ear: External ear normal.  Left Ear: External ear normal.  Nose: Nose normal.  Mouth/Throat: Oropharynx is clear and moist. No oropharyngeal exudate.  Eyes: Conjunctivae are normal. Pupils are equal, round, and reactive to light. Right eye exhibits no discharge. Left eye exhibits no discharge. No scleral icterus.  Neck: Normal range of motion. Neck supple.  Cardiovascular: Normal rate, regular rhythm, normal heart sounds and intact distal pulses.   Pulmonary/Chest: Effort normal and breath sounds normal.  Abdominal: Soft. Bowel sounds are normal.  obese  Genitourinary: Rectum normal and penis normal.  Due to size, difficult to reach prostate.   Musculoskeletal: Normal range of motion.  Lymphadenopathy:    He has no cervical adenopathy.  Neurological: He is alert and oriented to person, place, and time.  Skin: Skin is warm and dry. He is not diaphoretic.  Psychiatric: He has a normal mood and affect. His behavior is normal. Judgment and thought content normal.  Vitals reviewed.  BP 135/95 mmHg  Pulse 83  Temp(Src) 98.2 F (36.8 C) (Oral)  Resp 18  Ht 6\' 2"  (1.88 m)  Wt 515 lb (233.602 kg)  BMI 66.09 kg/m2  SpO2 93%     Assessment & Plan:  1. Annual physical exam  2. Morbid obesity - Lipid panel - Hemoglobin A1c - reviewed his intake and discussed recommendations to improve weight loss including avoiding all juice, soda, sweet tea, limiting portions, avoiding fast food, eating regular meals and snacks to avoid becoming overly hungry and over eating.  - encouraged increased exercise with daily walking now that his knee is on the mend.  - He is going to follow up with clinical [facility] 3. Essential hypertension - Lipid panel -  lisinopril (PRINIVIL,ZESTRIL) 5 MG tablet; Take 1 tablet (5 mg total) by mouth daily.  Dispense: 90 tablet; Refill: 1  4. Screening for prostate cancer - PSA   Olean Ree, FNP-BC  [facility], [facility]  02/18/2015 10:52 AM

## 2015-02-19 LAB — PSA: PSA: 0.44 ng/mL (ref <=4.00)

## 2015-03-12 ENCOUNTER — Other Ambulatory Visit: Payer: Self-pay | Admitting: Family Medicine

## 2015-03-16 ENCOUNTER — Ambulatory Visit: Payer: 59 | Admitting: Family Medicine

## 2015-05-27 ENCOUNTER — Encounter: Payer: Self-pay | Admitting: Family Medicine

## 2015-05-27 ENCOUNTER — Ambulatory Visit (INDEPENDENT_AMBULATORY_CARE_PROVIDER_SITE_OTHER): Payer: 59 | Admitting: Family Medicine

## 2015-05-27 VITALS — BP 150/100 | HR 88 | Temp 98.3°F | Resp 18 | Ht 74.25 in

## 2015-05-27 DIAGNOSIS — R39198 Other difficulties with micturition: Secondary | ICD-10-CM

## 2015-05-27 DIAGNOSIS — R3919 Other difficulties with micturition: Secondary | ICD-10-CM

## 2015-05-27 DIAGNOSIS — M25561 Pain in right knee: Secondary | ICD-10-CM

## 2015-05-27 DIAGNOSIS — I10 Essential (primary) hypertension: Secondary | ICD-10-CM

## 2015-05-27 DIAGNOSIS — M25562 Pain in left knee: Secondary | ICD-10-CM | POA: Diagnosis not present

## 2015-05-27 DIAGNOSIS — R350 Frequency of micturition: Secondary | ICD-10-CM

## 2015-05-27 LAB — BASIC METABOLIC PANEL
BUN: 14 mg/dL (ref 7–25)
CHLORIDE: 104 mmol/L (ref 98–110)
CO2: 29 mmol/L (ref 20–31)
Calcium: 8.9 mg/dL (ref 8.6–10.3)
Creat: 0.79 mg/dL (ref 0.60–1.35)
Glucose, Bld: 83 mg/dL (ref 65–99)
Potassium: 4.3 mmol/L (ref 3.5–5.3)
SODIUM: 143 mmol/L (ref 135–146)

## 2015-05-27 MED ORDER — AMLODIPINE BESYLATE 5 MG PO TABS: 5.0000 mg | ORAL_TABLET | Freq: Every day | ORAL | Status: DC

## 2015-05-27 NOTE — Progress Notes (Signed)
   Subjective:    Patient ID: Michael Payne, male    DOB: Aug 18, 1971, 44 y.o.   MRN: [id number]  HPI This is a pleasant 44 yo male who presents today for follow up of HTN. He is accompanied by his wife. He stopped his lisinopril because it caused wheezing. He stopped his HCTZ because of the diuretic effect. He denies chest pain, palpitations or SOB. 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   Sleeps poorly at night. Urinates every 2 hours through the night. Does not feel as though he has excessive urination throughout the day. Denies dysuria, hematuria, flank pain, penile itching/burning/dicharge. He reports several year history of weak urine stream. Labs from 02/18/15- PSA 0.44, HgbA1C 5.6.   He has bilateral knee pain, no relief with tylenol arthritis. He has not been doing his exercises from PT that he was given following his left knee arthroscopy. No falls, no instability.  He attended a seminar on bariatric surgery and started the process with [facility]. He reports that he has called them several times and has not received a return call. He is interested in seeing nutrition services. He and his wife report that it has been more difficult to eat well and stay on track with her pregnancy cravings. He also cites an irregular schedule of work and sleep.   Review of Systems Per HPI    Objective:   Physical Exam  Constitutional: He is oriented to person, place, and time. No distress.  Morbidly obese.   HENT:  Head: Normocephalic and atraumatic.  Eyes: Conjunctivae are normal.  Neck: Normal range of motion. Neck supple.  Cardiovascular: Normal rate and regular rhythm.   Pulmonary/Chest: Effort normal and breath sounds normal.  Musculoskeletal: Normal range of motion. He exhibits no edema or tenderness.    Neurological: He is alert and oriented to person, place, and time.  Skin: Skin is warm and dry. He is not diaphoretic.  Psychiatric: He has a normal mood and affect. His behavior is normal. Judgment and thought content normal.  Vitals reviewed.  Wt Readings from Last 3 Encounters: Unable to obtain weight  02/18/15 515 lb (233.602 kg)  12/16/14 523 lb (237.231 kg)  12/08/14 523 lb (237.231 kg)  BP 150/100 mmHg  Pulse 88  Temp(Src) 98.3 F (36.8 C) (Oral)  Resp 18  Ht 6' 2.25" (1.886 m)  Wt     Assessment & Plan:  1. Decreased urine stream - Ambulatory referral to Urology  2. Morbid obesity - Amb ref to Medical Nutrition Therapy-MNT - Hemoglobin A1c  3. Frequent urination - Hemoglobin A1c  4. Essential hypertension - Basic metabolic panel - amLODipine (NORVASC) 5 MG tablet; Take 1 tablet (5 mg total) by mouth daily.  Dispense: 30 tablet; Refill: 1 - He will come in for BP check in 1-2 weeks  5. Arthralgia of both knees - Encouraged him to resume PT exercises, use slip on braces for support, discussed Tylenol dosage. Can also use ice after being on his feet for long periods of time.  - Discussed effect of his weight on his knees and stressed need for weight loss.   - follow up in 3 months Olean Ree, FNP-BC  [facility], [facility]  05/29/2015 3:08 PM

## 2015-05-27 NOTE — Patient Instructions (Signed)
We will call you about a referral to urology and to a dietician   Amlodipine tablets What is this medicine? AMLODIPINE (am LOE di peen) is a calcium-channel blocker. It affects the amount of calcium found in your heart and muscle cells. This relaxes your blood vessels, which can reduce the amount of work the heart has to do. This medicine is used to lower high blood pressure. It is also used to prevent chest pain. This medicine may be used for other purposes; ask your health care provider or pharmacist if you have questions. COMMON BRAND NAME(S): Norvasc What should I tell my health care provider before I take this medicine? They need to know if you have any of these conditions: -heart problems like heart failure or aortic stenosis -liver disease -an unusual or allergic reaction to amlodipine, other medicines, foods, dyes, or preservatives -pregnant or trying to get pregnant -breast-feeding How should I use this medicine? Take this medicine by mouth with a glass of water. Follow the directions on the prescription label. Take your medicine at regular intervals. Do not take more medicine than directed. Talk to your pediatrician regarding the use of this medicine in children. Special care may be needed. This medicine has been used in children as young as 6. Persons over 56 years old may have a stronger reaction to this medicine and need smaller doses. Overdosage: If you think you have taken too much of this medicine contact a poison control center or emergency room at once. NOTE: This medicine is only for you. Do not share this medicine with others. What if I miss a dose? If you miss a dose, take it as soon as you can. If it is almost time for your next dose, take only that dose. Do not take double or extra doses. What may interact with this medicine? -herbal or dietary supplements -local or general anesthetics -medicines for high blood pressure -medicines for prostate  problems -rifampin This list may not describe all possible interactions. Give your health care provider a list of all the medicines, herbs, non-prescription drugs, or dietary supplements you use. Also tell them if you smoke, drink alcohol, or use illegal drugs. Some items may interact with your medicine. What should I watch for while using this medicine? Visit your doctor or health care professional for regular check ups. Check your blood pressure and pulse rate regularly. Ask your health care professional what your blood pressure and pulse rate should be, and when you should contact him or her. This medicine may make you feel confused, dizzy or lightheaded. Do not drive, use machinery, or do anything that needs mental alertness until you know how this medicine affects you. To reduce the risk of dizzy or fainting spells, do not sit or stand up quickly, especially if you are an older patient. Avoid alcoholic drinks; they can make you more dizzy. Do not suddenly stop taking amlodipine. Ask your doctor or health care professional how you can gradually reduce the dose. What side effects may I notice from receiving this medicine? Side effects that you should report to your doctor or health care professional as soon as possible: -allergic reactions like skin rash, itching or hives, swelling of the face, lips, or tongue -breathing problems -changes in vision or hearing -chest pain -fast, irregular heartbeat -swelling of legs or ankles Side effects that usually do not require medical attention (report to your doctor or health care professional if they continue or are bothersome): -dry mouth -facial flushing -nausea, vomiting -  stomach gas, pain -tired, weak -trouble sleeping This list may not describe all possible side effects. Call your doctor for medical advice about side effects. You may report side effects to FDA at 1-800-FDA-1088. Where should I keep my medicine? Keep out of the reach of  children. Store at room temperature between 59 and 86 degrees F (15 and 30 degrees C). Protect from light. Keep container tightly closed. Throw away any unused medicine after the expiration date. NOTE: This sheet is a summary. It may not cover all possible information. If you have questions about this medicine, talk to your doctor, pharmacist, or health care provider.  2015, Elsevier/Gold Standard. (2012-08-03 11:40:58)

## 2015-05-28 LAB — HEMOGLOBIN A1C
Hgb A1c MFr Bld: 5.8 % — ABNORMAL HIGH (ref <5.7)
Mean Plasma Glucose: 120 mg/dL — ABNORMAL HIGH (ref <117)

## 2015-07-07 ENCOUNTER — Ambulatory Visit: Payer: Self-pay | Admitting: Dietician

## 2015-07-24 ENCOUNTER — Emergency Department (HOSPITAL_COMMUNITY)
Admission: EM | Admit: 2015-07-24 | Discharge: 2015-07-24 | Disposition: A | Discharge status: Home / Self Care | Payer: 59 | Attending: Emergency Medicine | Admitting: Emergency Medicine

## 2015-07-24 ENCOUNTER — Emergency Department (HOSPITAL_COMMUNITY): Payer: 59

## 2015-07-24 ENCOUNTER — Encounter (HOSPITAL_COMMUNITY): Payer: Self-pay | Admitting: Emergency Medicine

## 2015-07-24 ENCOUNTER — Emergency Department (HOSPITAL_BASED_OUTPATIENT_CLINIC_OR_DEPARTMENT_OTHER): Admit: 2015-07-24 | Discharge: 2015-07-24 | Disposition: A | Discharge status: Home / Self Care | Payer: 59

## 2015-07-24 DIAGNOSIS — I10 Essential (primary) hypertension: Secondary | ICD-10-CM | POA: Insufficient documentation

## 2015-07-24 DIAGNOSIS — M79609 Pain in unspecified limb: Secondary | ICD-10-CM | POA: Diagnosis not present

## 2015-07-24 DIAGNOSIS — Z8719 Personal history of other diseases of the digestive system: Secondary | ICD-10-CM | POA: Insufficient documentation

## 2015-07-24 DIAGNOSIS — Z87891 Personal history of nicotine dependence: Secondary | ICD-10-CM | POA: Insufficient documentation

## 2015-07-24 DIAGNOSIS — M7989 Other specified soft tissue disorders: Secondary | ICD-10-CM | POA: Diagnosis not present

## 2015-07-24 DIAGNOSIS — Z79899 Other long term (current) drug therapy: Secondary | ICD-10-CM | POA: Insufficient documentation

## 2015-07-24 DIAGNOSIS — Z862 Personal history of diseases of the blood and blood-forming organs and certain disorders involving the immune mechanism: Secondary | ICD-10-CM | POA: Insufficient documentation

## 2015-07-24 DIAGNOSIS — R091 Pleurisy: Secondary | ICD-10-CM | POA: Insufficient documentation

## 2015-07-24 LAB — BASIC METABOLIC PANEL
Anion gap: 13 (ref 5–15)
BUN: 11 mg/dL (ref 6–20)
CALCIUM: 9.5 mg/dL (ref 8.9–10.3)
CHLORIDE: 103 mmol/L (ref 101–111)
CO2: 28 mmol/L (ref 22–32)
CREATININE: 1.17 mg/dL (ref 0.61–1.24)
GFR calc Af Amer: >60 mL/min (ref >60)
GFR calc non Af Amer: >60 mL/min (ref >60)
Glucose, Bld: 136 mg/dL — ABNORMAL HIGH (ref 65–99)
Potassium: 4.2 mmol/L (ref 3.5–5.1)
SODIUM: 144 mmol/L (ref 135–145)

## 2015-07-24 LAB — D-DIMER, QUANTITATIVE: D-Dimer, Quant: 0.4 ug/mL-FEU (ref 0.00–0.48)

## 2015-07-24 LAB — I-STAT TROPONIN, ED: TROPONIN I, POC: 0 ng/mL (ref 0.00–0.08)

## 2015-07-24 LAB — CBC
HCT: 43 % (ref 39.0–52.0)
Hemoglobin: 13.5 g/dL (ref 13.0–17.0)
MCH: 30.1 pg (ref 26.0–34.0)
MCHC: 31.4 g/dL (ref 30.0–36.0)
MCV: 96 fL (ref 78.0–100.0)
PLATELETS: 218 10*3/uL (ref 150–400)
RBC: 4.48 MIL/uL (ref 4.22–5.81)
RDW: 13.6 % (ref 11.5–15.5)
WBC: 6 10*3/uL (ref 4.0–10.5)

## 2015-07-24 MED ORDER — ONDANSETRON 4 MG PO TBDP
4.0000 mg | ORAL_TABLET | Freq: Once | ORAL | Status: AC
  Administered 2015-07-24: 4 mg via ORAL
  Filled 2015-07-24: qty 1

## 2015-07-24 MED ORDER — OXYCODONE-ACETAMINOPHEN 5-325 MG PO TABS
2.0000 | ORAL_TABLET | Freq: Once | ORAL | Status: AC
  Administered 2015-07-24: 2 via ORAL
  Filled 2015-07-24: qty 2

## 2015-07-24 MED ORDER — TRAMADOL HCL 50 MG PO TABS: 50.0000 mg | ORAL_TABLET | Freq: Four times a day (QID) | ORAL | Status: DC | PRN

## 2015-07-24 NOTE — ED Provider Notes (Signed)
CSN: [id number]     Arrival date & time 07/24/15  1512 History   First MD Initiated Contact with Patient 07/24/15 1649     Chief Complaint  Patient presents with  . Chest Pain      HPI  Patient presents for anterior right-sided chest pain. States he's had symptoms for about one week and was admitted first, and now constant. Sharp versus take a deep breath. Does not feel short of breath now. No fevers. Occasional cough. Does not offer spontaneously. However when asked he does state his left leg is sore. No GI complaints. No history of heart disease. Current nonsmoker. No family history of heart disease.  No prolonged immobilization cast splints fractures surgeries malignancies or other DVT or PE risks.  Past Medical History  Diagnosis Date  . HTN (hypertension)   . GERD (gastroesophageal reflux disease)     lactose intolerance  . Anemia     borderline anemia, was instructed to eat more iron filled foods   Past Surgical History  Procedure Laterality Date  . Foot surgery    . Knee arthroscopy with medial menisectomy Left 12/16/2014    Procedure: KNEE ARTHROSCOPY WITH MEDIAL MENISECTOMY;  Surgeon: Sheral Apley, MD;  Location: [facility] OR;  Service: Orthopedics;  Laterality: Left;   Family History  Problem Relation Age of Onset  . Heart attack Mother 23  . Lung cancer Father   . CAD Maternal Grandfather    Social History  Substance Use Topics  . Smoking status: Former Smoker -- 2.00 packs/day for 22 years    Types: Cigarettes    Quit date: 02/24/2011  . Smokeless tobacco: None  . Alcohol Use: Yes     Comment: Weekends/beer-- has slowed down  alot    Review of Systems  Constitutional: Negative for fever, chills, diaphoresis, appetite change and fatigue.  HENT: Negative for mouth sores, sore throat and trouble swallowing.   Eyes: Negative for visual disturbance.  Respiratory: Negative for cough, chest tightness, shortness of breath and wheezing.   Cardiovascular: Positive for  chest pain.  Gastrointestinal: Negative for nausea, vomiting, abdominal pain, diarrhea and abdominal distention.  Endocrine: Negative for polydipsia, polyphagia and polyuria.  Genitourinary: Negative for dysuria, frequency and hematuria.  Musculoskeletal: Negative for gait problem.       Left leg pain  Skin: Negative for color change, pallor and rash.  Neurological: Negative for dizziness, syncope, light-headedness and headaches.  Hematological: Does not bruise/bleed easily.  Psychiatric/Behavioral: Negative for behavioral problems and confusion.      Allergies  Aspirin  Home Medications   Prior to Admission medications   Medication Sig Start Date End Date Taking? Authorizing Provider  amLODipine (NORVASC) 5 MG tablet Take 1 tablet (5 mg total) by mouth daily. 05/27/15  Yes Emi Belfast, FNP  ibuprofen (ADVIL,MOTRIN) 200 MG tablet Take 800 mg by mouth every 6 (six) hours as needed (pain).   Yes Historical Provider, MD  traMADol (ULTRAM) 50 MG tablet Take 1 tablet (50 mg total) by mouth every 6 (six) hours as needed. 07/24/15   Rolland Porter, MD   BP 147/76 mmHg  Pulse 82  Temp(Src) 98.8 F (37.1 C) (Oral)  Resp 20  SpO2 96% Physical Exam  Constitutional: He is oriented to person, place, and time. He appears well-developed and well-nourished. No distress.  Morbid obesity, 562 lbs.   HENT:  Head: Normocephalic.  Eyes: Conjunctivae are normal. Pupils are equal, round, and reactive to light. No scleral icterus.  Neck: Normal  range of motion. Neck supple. No thyromegaly present.  Cardiovascular: Normal rate and regular rhythm.  Exam reveals no gallop and no friction rub.   No murmur heard. Pulmonary/Chest: Effort normal and breath sounds normal. No respiratory distress. He has no wheezes. He has no rales.  Abdominal: Soft. Bowel sounds are normal. He exhibits no distension. There is no tenderness. There is no rebound.  Musculoskeletal: Normal range of motion.  Neurological: He  is alert and oriented to person, place, and time.  Skin: Skin is warm and dry. No rash noted.  Psychiatric: He has a normal mood and affect. His behavior is normal.    ED Course  Procedures (including critical care time) Labs Review Labs Reviewed  BASIC METABOLIC PANEL - Abnormal; Notable for the following:    Glucose, Bld 136 (*)    All other components within normal limits  CBC  D-DIMER, QUANTITATIVE (NOT AT [facility])  Rosezena Sensor, ED    Imaging Review Dg Chest 2 View  07/24/2015  CLINICAL DATA:  Chest pain and shortness of breath EXAM: CHEST  2 VIEW COMPARISON:  07/11/2012 FINDINGS: The heart size and mediastinal contours are within normal limits. Both lungs are clear. The visualized skeletal structures are unremarkable. IMPRESSION: No active cardiopulmonary disease. Electronically Signed   By: Judie Petit.  Shick M.D.   On: 07/24/2015 15:50   I have personally reviewed and evaluated these images and lab results as part of my medical decision-making.   EKG Interpretation   Date/Time:  Friday July 24 2015 15:13:50 EDT Ventricular Rate:  101 PR Interval:  170 QRS Duration: 88 QT Interval:  346 QTC Calculation: 448 R Axis:   68 Text Interpretation:  Sinus tachycardia Otherwise normal ECG Confirmed by  Fayrene Fearing  MD, Lindsay Soulliere ([id number]) on 07/24/2015 3:19:44 PM      MDM   Final diagnoses:  Pleurisy    Normal EKG. Normal CXR. Normal exam.  Doppler without clot.  Normal D dimer.  Plan tx with Pain meds for pleurisy.    Rolland Porter, MD 07/24/15 [phone]

## 2015-07-24 NOTE — Discharge Instructions (Signed)
Pleurisy  Pleurisy is an inflammation and swelling of the lining of the lungs (pleura). Because of this inflammation, it hurts to breathe. It can be aggravated by coughing, laughing, or deep breathing. Pleurisy is often caused by an underlying infection or disease.   HOME CARE INSTRUCTIONS   Monitor your pleurisy for any changes. The following actions may help to alleviate any discomfort you are experiencing:  · Medicine may help with pain. Only take over-the-counter or prescription medicines for pain, discomfort, or fever as directed by your health care provider.  · Only take antibiotic medicine as directed. Make sure to finish it even if you start to feel better.  SEEK MEDICAL CARE IF:   · Your pain is not controlled with medicine or is increasing.  · You have an increase in pus-like (purulent) secretions brought up with coughing.  SEEK IMMEDIATE MEDICAL CARE IF:   · You have blue or dark lips, fingernails, or toenails.  · You are coughing up blood.  · You have increased difficulty breathing.  · You have continuing pain unrelieved by medicine or pain lasting more than 1 week.  · You have pain that radiates into your neck, arms, or jaw.  · You develop increased shortness of breath or wheezing.  · You develop a fever, rash, vomiting, fainting, or other serious symptoms.  MAKE SURE YOU:  · Understand these instructions.    · Will watch your condition.    · Will get help right away if you are not doing well or get worse.        This information is not intended to replace advice given to you by your health care provider. Make sure you discuss any questions you have with your health care provider.     Document Released: 09/05/2005 Document Revised: 05/08/2013 Document Reviewed: 02/17/2013  Elsevier Interactive Patient Education ©2016 Elsevier Inc.

## 2015-07-24 NOTE — Progress Notes (Signed)
*  PRELIMINARY RESULTS* Vascular Ultrasound Left lower extremity venous duplex has been completed.  Preliminary findings: No evidence of DVT or baker's cyst.   Farrel Demark, RDMS, RVT  07/24/2015, 5:30 PM

## 2015-07-24 NOTE — ED Notes (Signed)
Pt sts generalized CP worse with inspiration x 8 days; pt labored at present

## 2015-08-25 ENCOUNTER — Ambulatory Visit: Payer: 59 | Admitting: Family Medicine

## 2015-08-26 ENCOUNTER — Ambulatory Visit: Payer: 59 | Admitting: Family Medicine

## 2015-10-07 DIAGNOSIS — Z0271 Encounter for disability determination: Secondary | ICD-10-CM

## 2015-10-20 ENCOUNTER — Other Ambulatory Visit: Payer: Self-pay | Admitting: Family Medicine

## 2015-11-09 ENCOUNTER — Ambulatory Visit (INDEPENDENT_AMBULATORY_CARE_PROVIDER_SITE_OTHER): Payer: Self-pay | Admitting: Family Medicine

## 2015-11-09 ENCOUNTER — Encounter: Payer: Self-pay | Admitting: Family Medicine

## 2015-11-09 VITALS — BP 140/98 | HR 77 | Temp 98.3°F | Resp 18 | Ht 76.0 in

## 2015-11-09 DIAGNOSIS — I10 Essential (primary) hypertension: Secondary | ICD-10-CM

## 2015-11-09 DIAGNOSIS — Z024 Encounter for examination for driving license: Secondary | ICD-10-CM

## 2015-11-09 DIAGNOSIS — Z021 Encounter for pre-employment examination: Secondary | ICD-10-CM

## 2015-11-09 MED ORDER — AMLODIPINE BESYLATE 10 MG PO TABS: 10.0000 mg | ORAL_TABLET | Freq: Every day | ORAL | Status: DC

## 2015-11-09 MED ORDER — LISINOPRIL-HYDROCHLOROTHIAZIDE 10-12.5 MG PO TABS
1.0000 | ORAL_TABLET | Freq: Every day | ORAL | Status: DC
Start: 2015-11-09 — End: 2018-04-11

## 2015-11-09 NOTE — Progress Notes (Signed)
Patient ID: Michael Payne, male    DOB: 06/09/1971  Age: 45 y.o. MRN: [id number]  Chief Complaint  Patient presents with  . Employment Physical    DOT    Subjective:   Patient is here for his DOT physical examination. Last year he had knee surgery, and is just now getting back to work. He was sedentary at home and cannot wait. He got up to what he says was about 570, has been working at it and is lost about 35 pounds. He is still too large to weigh on our scales. He otherwise is been healthy with no major complaints except for his activity still limited by bad knees and his obesity.  Past medical history: Medical illnesses: History of hypertension Surgeries: Knee arthroscopy; pins in right foot Medications: Amlodipine 5 mg daily Medication allergies: None  Social history: Is married has 7 children, including 2 young ones. Is a truck driver by trade.  Review of systems: Essentially unremarkable except for aches and pains    Current allergies, medications, problem list, past/family and social histories reviewed.  Objective:  BP 158/106 mmHg  Pulse 77  Temp(Src) 98.3 F (36.8 C)  Resp 18  Ht 6\' 4"  (1.93 m)  Wt   SpO2 95%  Physical exam: Morbidly obese man alert and oriented in no acute distress. He wears glasses. HEENT normal with TMs normal. Throat clear. Neck supple without nodes. Eyes PERRLA. No cord paresis. Chest clear. Heart rate without murmurs. Himself without mass or tenderness. Normal male external genitalia with no hernias. Extremities unremarkable. Skin unremarkable.  Assessment & Plan:   Assessment: No diagnosis found.    Plan: 3 month card pending better control of his blood pressure. Talked to him about the need to get very very serious with the weight loss.  No orders of the defined types were placed in this encounter.    No orders of the defined types were placed in this encounter.         There are no Patient Instructions on file for this  visit.   No Follow-up on file.   HOPPER,DAVID, MD 11/09/2015

## 2015-11-09 NOTE — Patient Instructions (Signed)
Continue amlodipine but increase to 10 mg daily  Add lisinopril HCT 10/12.5  Return in less than 90 days for follow-up check. I would recommend you return in about 6 weeks rather than waiting until the last minute in case her pressure is not well enough controlled yet.  Work hard on weight loss Cut out snacks Drink water Decrease meal portions Desserts on special occasions only Be extremely cautious if eating out Get your body moving

## 2016-12-13 IMAGING — MR MRI OF THE LEFT KNEE WITHOUT CONTRAST
1 series · 19 of 38 positions shown · non-contrast
Comparison: Plain films left knee 09/30/2014.

CLINICAL DATA: Left knee injury 09/30/2014 when climbing into a
truck. The patient's left knee popped.

EXAM:
MRI OF THE LEFT KNEE WITHOUT CONTRAST
TECHNIQUE: Multiplanar, multisequence MR imaging of the knee was performed. No
intravenous contrast was administered.

[Series 5: PD fat-sat · axial · 3.0mm · 0.31mm/px · z∈[-13,+102]mm · 19 of 38 slices shown]
[im 1/38]
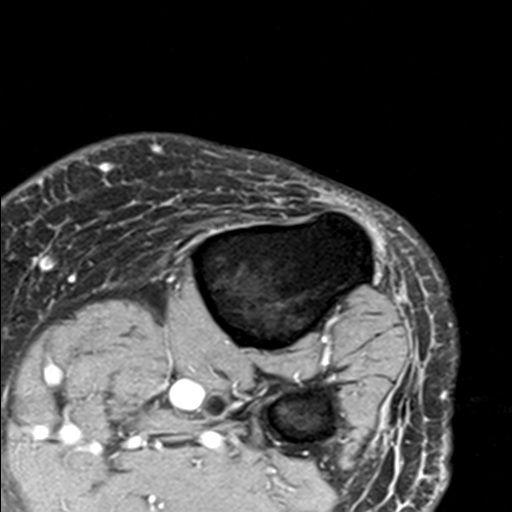
[im 2/38]
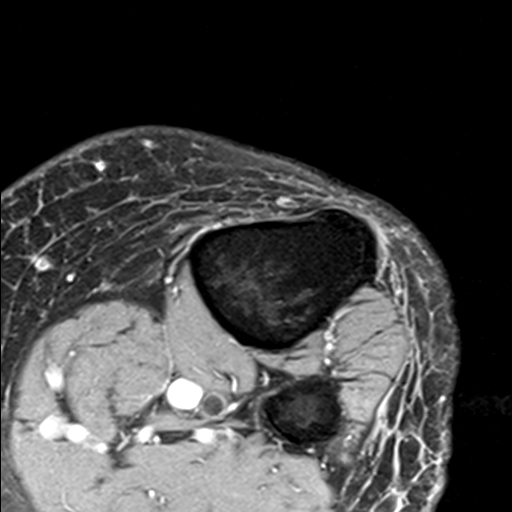
[im 3/38]
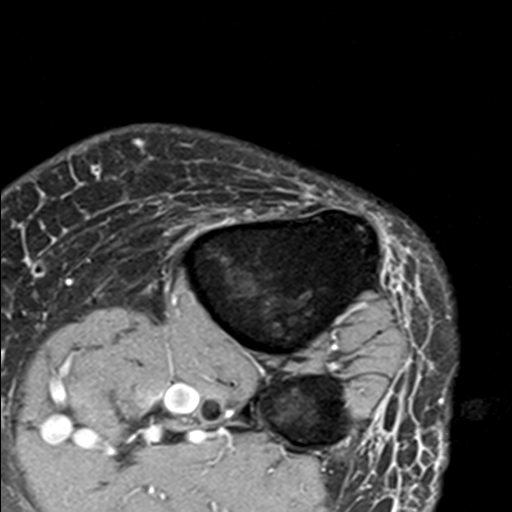
[im 4/38]
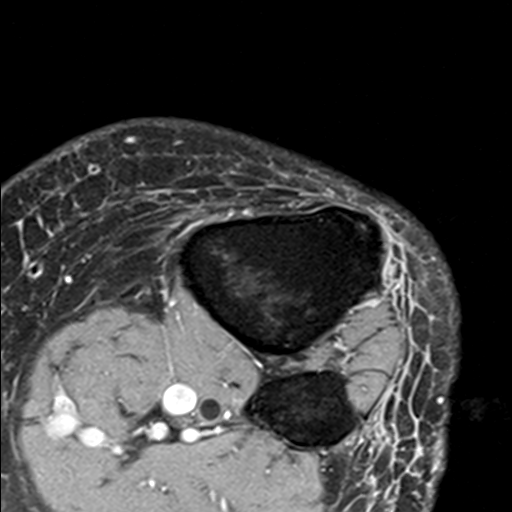
[im 5/38]
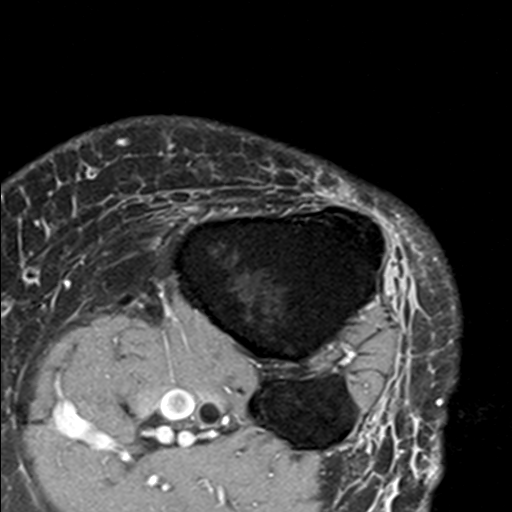
[im 6/38]
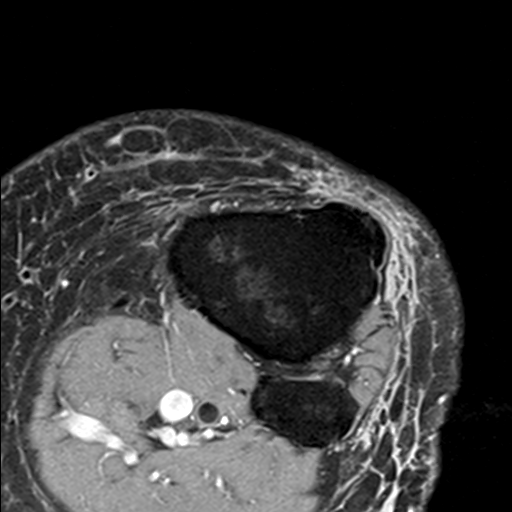
[im 7/38]
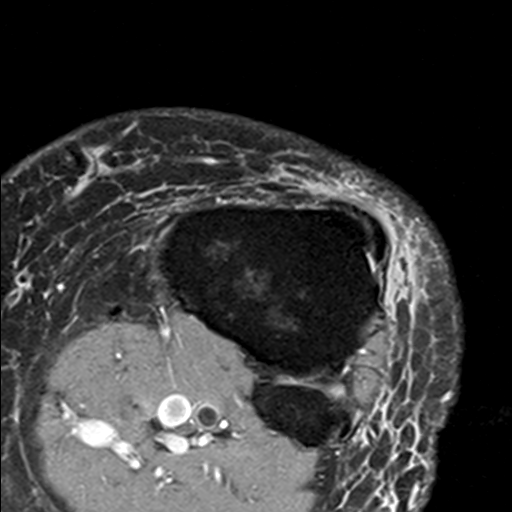
[im 8/38]
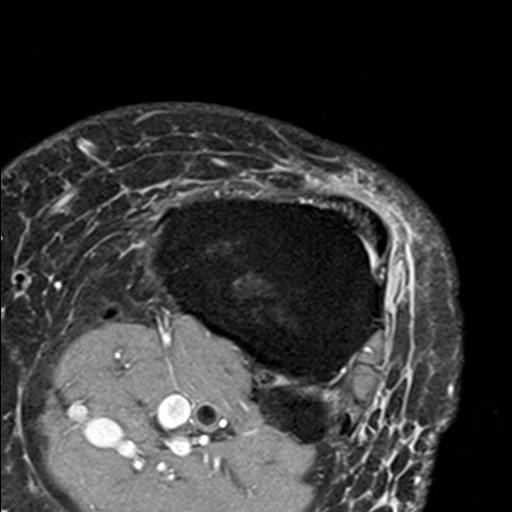
[im 9/38]
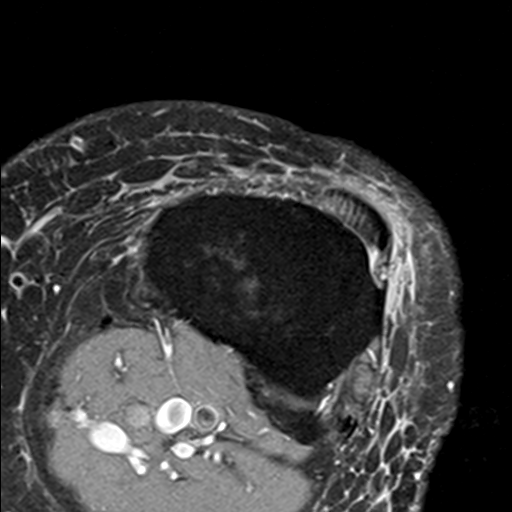
[im 10/38]
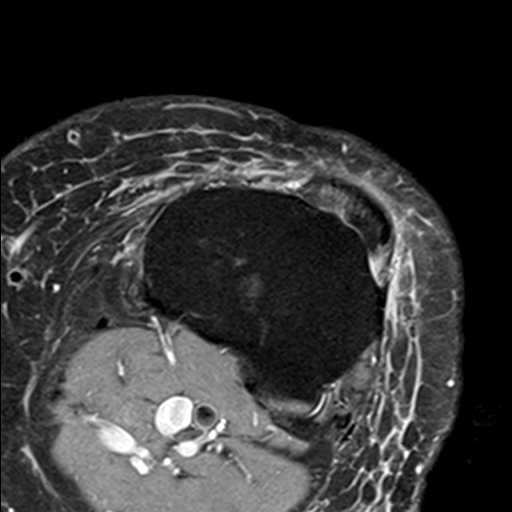
[im 11/38]
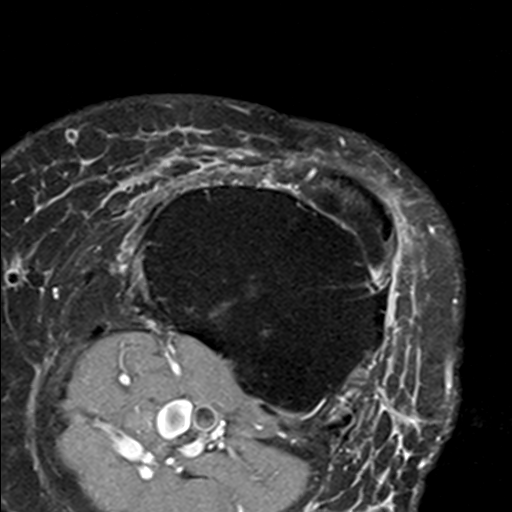
[im 12/38]
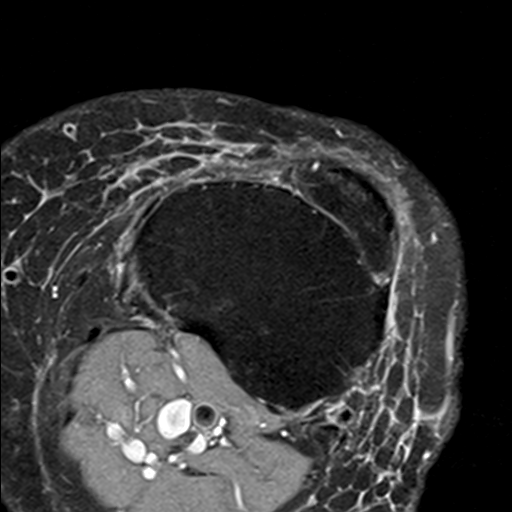
[im 17/38]
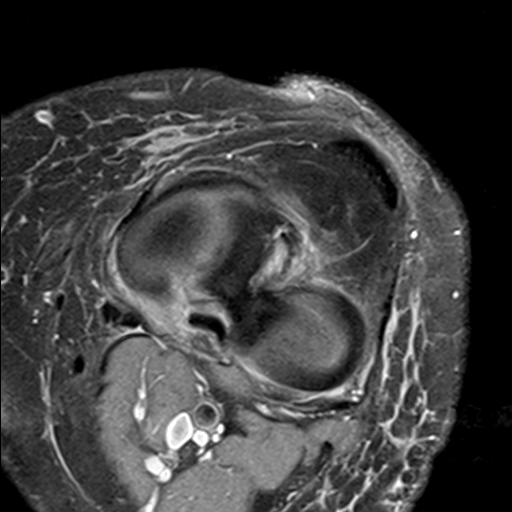
[im 20/38]
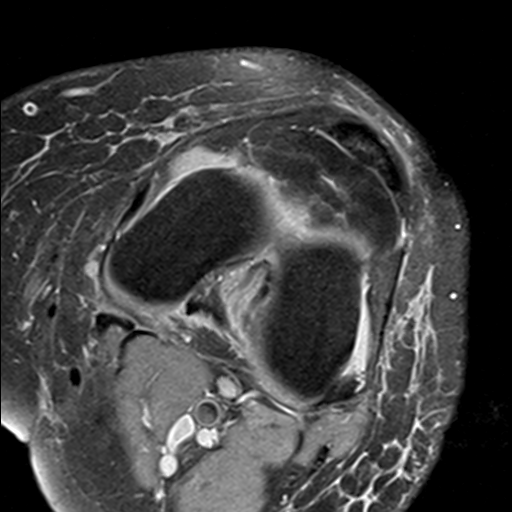
[im 22/38]
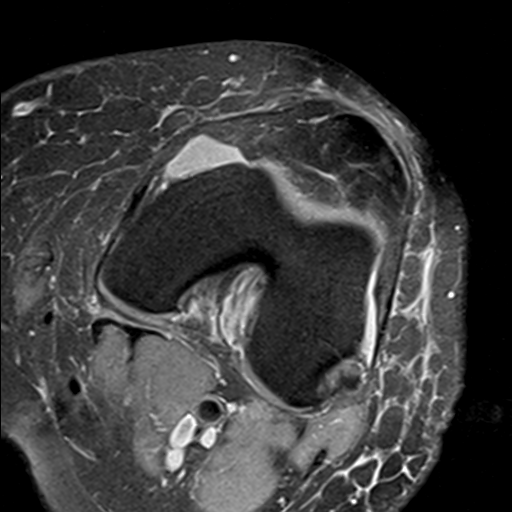
[im 27/38]
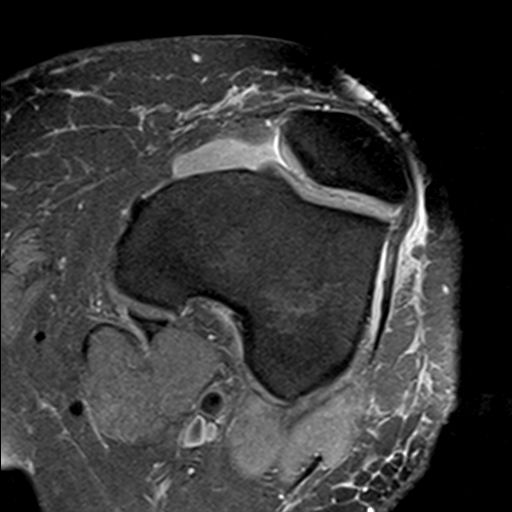
[im 31/38]
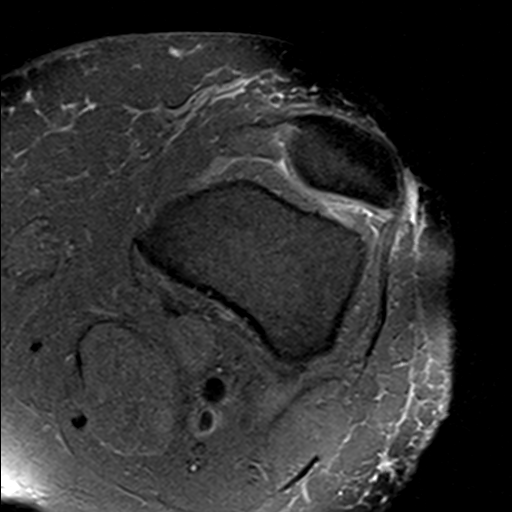
[im 32/38]
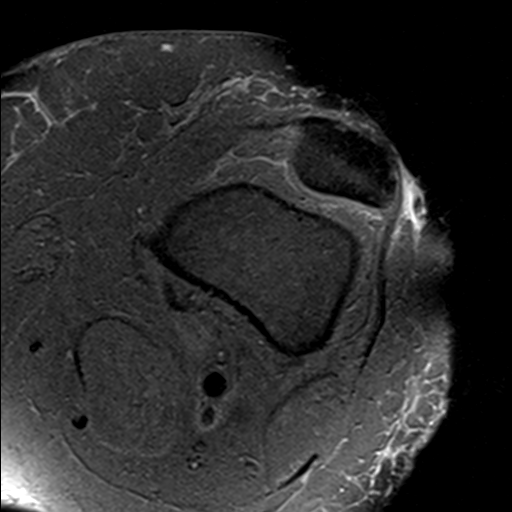
[im 36/38]
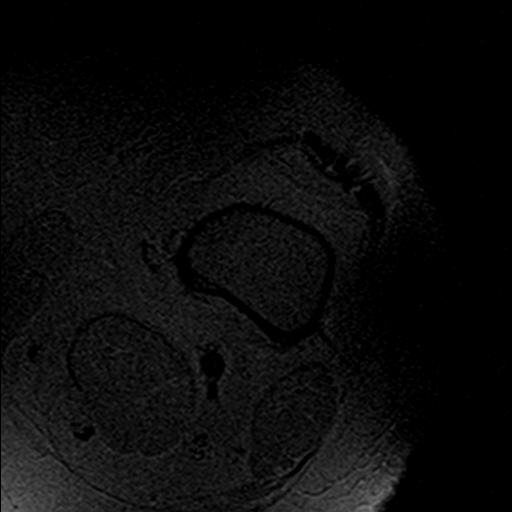

[19 of 38 positions shown; findings below may reference images not displayed]

FINDINGS: MENISCI

Medial meniscus: There is a large radial tear at and peripheral
through the root of the posterior horn of the medial meniscus. The
body of the medial meniscus is displaced peripherally is somewhat.
No centrally displaced fragment.

Lateral meniscus:  Intact.

LIGAMENTS

Cruciates: Intact. There is marked mucoid degeneration of the
anterior cruciate ligament which is thickened with intrasubstance
increased T2 signal.

Collaterals:  Intact.

CARTILAGE

Patellofemoral: Thinning and irregularity of hyaline cartilage about
the patella is most notable along the lateral facet. No
full-thickness defect or underlying reactive marrow signal change is
seen.

Medial:  Intact.

Lateral:  Intact.

Joint:  Small joint effusion is seen.

Popliteal Fossa:  No Baker's cyst.

Extensor Mechanism:  Intact.

Bones: Normal marrow signal throughout. Small osteophytes are
present about the medial compartment.
IMPRESSION: Large radial tear at and peripheral to the root of the posterior
horn of the medial meniscus.

Marked mucoid degeneration of the anterior cruciate ligament without
tear.

Chondromalacia patella.

## 2017-04-24 ENCOUNTER — Ambulatory Visit: Payer: Self-pay | Admitting: Registered"

## 2017-05-08 ENCOUNTER — Ambulatory Visit: Payer: Self-pay | Admitting: Registered"

## 2017-08-24 DIAGNOSIS — E8881 Metabolic syndrome: Secondary | ICD-10-CM | POA: Insufficient documentation

## 2017-08-24 DIAGNOSIS — E786 Lipoprotein deficiency: Secondary | ICD-10-CM | POA: Insufficient documentation

## 2017-08-24 DIAGNOSIS — E781 Pure hyperglyceridemia: Secondary | ICD-10-CM | POA: Insufficient documentation

## 2018-02-22 DIAGNOSIS — G4733 Obstructive sleep apnea (adult) (pediatric): Secondary | ICD-10-CM | POA: Insufficient documentation

## 2018-03-27 DIAGNOSIS — Z0100 Encounter for examination of eyes and vision without abnormal findings: Secondary | ICD-10-CM | POA: Diagnosis not present

## 2018-03-27 DIAGNOSIS — H524 Presbyopia: Secondary | ICD-10-CM | POA: Diagnosis not present

## 2018-04-02 DIAGNOSIS — M25559 Pain in unspecified hip: Secondary | ICD-10-CM | POA: Diagnosis not present

## 2018-04-02 DIAGNOSIS — G894 Chronic pain syndrome: Secondary | ICD-10-CM | POA: Diagnosis not present

## 2018-04-02 DIAGNOSIS — M25569 Pain in unspecified knee: Secondary | ICD-10-CM | POA: Diagnosis not present

## 2018-04-06 DIAGNOSIS — G4733 Obstructive sleep apnea (adult) (pediatric): Secondary | ICD-10-CM | POA: Diagnosis not present

## 2018-04-10 DIAGNOSIS — G4733 Obstructive sleep apnea (adult) (pediatric): Secondary | ICD-10-CM | POA: Diagnosis not present

## 2018-04-10 DIAGNOSIS — Z6841 Body Mass Index (BMI) 40.0 and over, adult: Secondary | ICD-10-CM | POA: Diagnosis not present

## 2018-04-10 DIAGNOSIS — Z9989 Dependence on other enabling machines and devices: Secondary | ICD-10-CM | POA: Diagnosis not present

## 2018-04-10 DIAGNOSIS — I10 Essential (primary) hypertension: Secondary | ICD-10-CM | POA: Diagnosis not present

## 2018-04-11 ENCOUNTER — Encounter: Payer: Self-pay | Admitting: Family Medicine

## 2018-04-11 ENCOUNTER — Ambulatory Visit (INDEPENDENT_AMBULATORY_CARE_PROVIDER_SITE_OTHER): Payer: Medicare HMO | Admitting: Family Medicine

## 2018-04-11 VITALS — BP 126/78 | HR 84 | Temp 98.4°F | Ht 76.0 in | Wt >= 6400 oz

## 2018-04-11 DIAGNOSIS — G4733 Obstructive sleep apnea (adult) (pediatric): Secondary | ICD-10-CM

## 2018-04-11 DIAGNOSIS — Z9989 Dependence on other enabling machines and devices: Secondary | ICD-10-CM

## 2018-04-11 DIAGNOSIS — I10 Essential (primary) hypertension: Secondary | ICD-10-CM | POA: Diagnosis not present

## 2018-04-11 DIAGNOSIS — Z7689 Persons encountering health services in other specified circumstances: Secondary | ICD-10-CM

## 2018-04-11 DIAGNOSIS — M1A09X0 Idiopathic chronic gout, multiple sites, without tophus (tophi): Secondary | ICD-10-CM | POA: Diagnosis not present

## 2018-04-11 MED ORDER — ALLOPURINOL 300 MG PO TABS: 300.0000 mg | ORAL_TABLET | Freq: Two times a day (BID) | ORAL | 1 refills | Status: DC | PRN

## 2018-04-11 NOTE — Progress Notes (Signed)
Subjective:    Patient ID: Michael Payne, male    DOB: 1971-05-14, 47 y.o.   MRN: [id number]  HPI This is a 47 yo male who presents today to reestablish care. Has not been seen in over 3 years by me. Is current going through [facility] program and is trying to loose enough weight to get sleeve. Continues to have multiple joint pain, hoping that weight loss will help and he can avoid surgery. Has lost 80 pounds. Has older children and 15,2 yo. Driving to [place] tomorrow to try to reconcile with his 34 yo daughter who he hasn't seen in 3 years. 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    Last CPE- 02/18/15 Colonoscopy- never Tdap- 02/03/12 Flu- not usuallly Eye- within last year Exercise- doing water aerobics 6 days a week Mood- up and down, generally improved   OSA- using CPAP  Gout- occasionally uses Allopurinol, requests refill today. Mostly avoids his triggers. Has stopped drinking.  Pain management- sees Dr. [name]    Past Medical History:  Diagnosis Date  . Anemia    borderline anemia, was instructed to eat more iron filled foods  . GERD (gastroesophageal reflux disease)    lactose intolerance  . HTN (hypertension)    Past Surgical History:  Procedure Laterality Date  . FOOT SURGERY    . KNEE ARTHROSCOPY WITH MEDIAL MENISECTOMY Left 12/16/2014   Procedure: KNEE ARTHROSCOPY WITH MEDIAL MENISECTOMY;  Surgeon: Sheral Apley, MD;  Location: [facility] OR;  Service: Orthopedics;  Laterality: Left;   Family History  Problem Relation Age of Onset  . Heart attack Mother 61  . Lung cancer Father   . Cancer Father   . CAD Maternal Grandfather    Social History   Tobacco Use  . Smoking status: Former Smoker    Packs/day: 2.00    Years: 22.00    Pack years: 44.00    Types: Cigarettes    Last attempt to quit: 02/24/2011    Years since quitting: 7.1  . Smokeless tobacco: Never Used  Substance Use Topics  . Alcohol use: Yes    Comment: Weekends/beer-- has slowed down   alot  . Drug use: Not Currently    Types: Marijuana    Comment: former -  quit in 2012      Review of Systems Per HPI    Objective:   Physical Exam Physical Exam  Constitutional: Oriented to person, place, and time. He appears well-developed and well-nourished. Morbidly obese.  HENT:  Head: Normocephalic and atraumatic.  Eyes: Conjunctivae are normal.  Neck:  Neck supple.  Cardiovascular: Normal rate, regular rhythm and normal heart sounds.   Pulmonary/Chest: Effort normal and breath sounds normal.  Musculoskeletal: Trace pretibial edema  Neurological: Alert and oriented to person, place, and time.  Skin: Skin is warm and dry.  Psychiatric: Normal mood and affect. Behavior is normal. Judgment and thought content normal.  Vitals reviewed.  BP 126/78 (BP Location: Right Arm, Patient Position: Sitting, Cuff Size: Large)   Pulse 84   Temp 98.4 F (36.9 C) (Oral)   Ht 6\' 4"  (1.93 m)   Wt (!) 512 lb (232.2 kg)   SpO2 94%   BMI 62.32 kg/m  Wt Readings from Last 3 Encounters:  04/11/18 (!) 512 lb (232.2 kg)  02/18/15 (!) 515 lb (233.6 kg)  12/16/14 (!) 523 lb (237.2 kg)       Assessment & Plan:  1. Encounter to establish care - Discussed  and encouraged healthy lifestyle choices- adequate sleep, regular exercise, stress management and healthy food choices.  - follow up for CPE in 6 months  2. Idiopathic chronic gout of multiple sites without tophus - allopurinol (ZYLOPRIM) 300 MG tablet; Take 1 tablet (300 mg total) by mouth 2 (two) times daily as needed.  Dispense: 60 tablet; Refill: 1  3. Morbid obesity (HCC) - continued follow up at [facility] bariatric center - encouraged patient's efforts to make healthy food choices and exercise  4. Essential hypertension - well controlled  5. OSA on CPAP - continue cpap   Olean Ree, FNP-BC  [facility], [facility]  04/11/2018 5:29 PM

## 2018-04-11 NOTE — Patient Instructions (Addendum)
Please follow up in 6 months for complete physical exam  Good to see you toay!

## 2018-04-26 DIAGNOSIS — G4733 Obstructive sleep apnea (adult) (pediatric): Secondary | ICD-10-CM | POA: Diagnosis not present

## 2018-04-30 DIAGNOSIS — G894 Chronic pain syndrome: Secondary | ICD-10-CM | POA: Diagnosis not present

## 2018-04-30 DIAGNOSIS — M25569 Pain in unspecified knee: Secondary | ICD-10-CM | POA: Diagnosis not present

## 2018-04-30 DIAGNOSIS — M25559 Pain in unspecified hip: Secondary | ICD-10-CM | POA: Diagnosis not present

## 2018-05-01 DIAGNOSIS — Z9989 Dependence on other enabling machines and devices: Secondary | ICD-10-CM | POA: Diagnosis not present

## 2018-05-01 DIAGNOSIS — G4733 Obstructive sleep apnea (adult) (pediatric): Secondary | ICD-10-CM | POA: Diagnosis not present

## 2018-05-01 DIAGNOSIS — Z6841 Body Mass Index (BMI) 40.0 and over, adult: Secondary | ICD-10-CM | POA: Diagnosis not present

## 2018-05-01 DIAGNOSIS — I10 Essential (primary) hypertension: Secondary | ICD-10-CM | POA: Diagnosis not present

## 2018-05-07 DIAGNOSIS — G4733 Obstructive sleep apnea (adult) (pediatric): Secondary | ICD-10-CM | POA: Diagnosis not present

## 2018-05-30 DIAGNOSIS — G894 Chronic pain syndrome: Secondary | ICD-10-CM | POA: Diagnosis not present

## 2018-05-30 DIAGNOSIS — M25559 Pain in unspecified hip: Secondary | ICD-10-CM | POA: Diagnosis not present

## 2018-05-30 DIAGNOSIS — M25569 Pain in unspecified knee: Secondary | ICD-10-CM | POA: Diagnosis not present

## 2018-06-07 DIAGNOSIS — G4733 Obstructive sleep apnea (adult) (pediatric): Secondary | ICD-10-CM | POA: Diagnosis not present

## 2018-06-11 ENCOUNTER — Ambulatory Visit (INDEPENDENT_AMBULATORY_CARE_PROVIDER_SITE_OTHER): Payer: Medicare HMO | Admitting: Family Medicine

## 2018-06-11 ENCOUNTER — Telehealth: Payer: Self-pay

## 2018-06-11 VITALS — HR 94 | Temp 98.6°F | Ht 76.0 in | Wt >= 6400 oz

## 2018-06-11 DIAGNOSIS — J069 Acute upper respiratory infection, unspecified: Secondary | ICD-10-CM

## 2018-06-11 DIAGNOSIS — L608 Other nail disorders: Secondary | ICD-10-CM | POA: Diagnosis not present

## 2018-06-11 DIAGNOSIS — M25551 Pain in right hip: Secondary | ICD-10-CM | POA: Diagnosis not present

## 2018-06-11 MED ORDER — CELECOXIB 100 MG PO CAPS: 100.0000 mg | ORAL_CAPSULE | Freq: Two times a day (BID) | ORAL | 1 refills | Status: DC

## 2018-06-11 NOTE — Telephone Encounter (Signed)
PLEASE NOTE: All timestamps contained within this report are represented as [place] Standard Time. CONFIDENTIALTY NOTICE: This fax transmission is intended only for the addressee. It contains information that is legally privileged, confidential or otherwise protected from use or disclosure. If you are not the intended recipient, you are strictly prohibited from reviewing, disclosing, copying using or disseminating any of this information or taking any action in reliance on or regarding this information. If you have received this fax in error, please notify us immediately by telephone so that we can arrange for its return to [place]. Phone: [phone], Toll-Free: [phone], Fax: [phone] Page: 1 of 2 Call Id: [id number] [facility] Night - Client TELEPHONE ADVICE RECORD [facility] Medical Call Center Patient Name: Michael Payne Gender: Male DOB: April 11, 1971 Age: 47 Y 8 M 19 D Return Phone Number: [phone] (Primary) Address: City/State/Zip: [facility] [place] [address] Client [facility] Night - Client Client Site [facility] - Night Contact Type Call Who Is Calling Patient / Member / Family / Caregiver Call Type Triage / Clinical Caller Name Syrus Nakama Relationship To Patient Self Return Phone Number [phone] (Primary) Chief Complaint Hip pain Reason for Call Symptomatic / Request for Health Information Initial Comment Caller states, he ran out of one of his prescription. He is currently out of town. His hip pain artheritis/ Translation No Nurse Assessment Nurse: Mayford Knife, RN, Whitney Date/Time (Eastern Time): 06/09/2018 11:39:28 AM Please select the assessment type ---Refill Additional Documentation ---states he ran out a week ago, thought he could make it until next week, but cant. Does the patient have enough medication to last until the office opens? ---No Additional Documentation ---[facility] [phone] Nurse:  Mayford Knife, RN, Whitney Date/Time (Eastern Time): 06/09/2018 11:45:51 AM Confirm and document reason for call. If symptomatic, describe symptoms. ---having hip pain for about 3 days. Does the patient have any new or worsening symptoms? ---Yes Will a triage be completed? ---Yes Related visit to physician within the last 2 weeks? ---No Does the PT have any chronic conditions? (i.e. diabetes, asthma, this includes High risk factors for pregnancy, etc.) ---No Is this a behavioral health or substance abuse call? ---No Guidelines Guideline Title Affirmed Question Affirmed Notes Nurse Date/Time (Eastern Time) Hip Pain [1] SEVERE pain (e.g., excruciating, unable to do any normal activities) AND [2] not improved after 2 hours of pain medicine Mayford Knife, RN, Whitney 06/09/2018 11:46:03 AM PLEASE NOTE: All timestamps contained within this report are represented as [place] Standard Time. CONFIDENTIALTY NOTICE: This fax transmission is intended only for the addressee. It contains information that is legally privileged, confidential or otherwise protected from use or disclosure. If you are not the intended recipient, you are strictly prohibited from reviewing, disclosing, copying using or disseminating any of this information or taking any action in reliance on or regarding this information. If you have received this fax in error, please notify us immediately by telephone so that we can arrange for its return to [place]. Phone: [phone], Toll-Free: [phone], Fax: [phone] Page: 2 of 2 Call Id: [id number] Disp. Time Lamount Cohen Time) Disposition Final User 06/09/2018 11:44:38 AM Pharmacy Call Mayford Knife, RN, Whitney Reason: Pharmacist states that he has not had celbrex filled since july and it was only a one month supply 06/09/2018 11:48:53 AM See HCP within 4 Hours (or PCP triage) Yes Mayford Knife, RN, Alphonzo Lemmings Caller Disagree/Comply Disagree Caller Understands Yes PreDisposition  InappropriateToAsk Care Advice Given Per Guideline SEE HCP WITHIN 4 HOURS (OR PCP TRIAGE): * IF OFFICE  WILL BE CLOSED AND NO PCP (PRIMARY CARE PROVIDER) SECOND-LEVEL TRIAGE: You need to be seen within the next 3 or 4 hours. A nearby Urgent Care Center [facility]) is often a good source of care. Another choice is to go to the ED. Go sooner if you become worse. PAIN MEDICINES: IBUPROFEN (E.G., MOTRIN, ADVIL): * Take 400 mg (two 200 mg pills) by mouth every 6 hours as needed. * Another choice is to take 600 mg (three 200 mg pills) by mouth every 8 hours as needed. Referrals GO TO FACILITY REFUSED

## 2018-06-11 NOTE — Telephone Encounter (Signed)
Noted  

## 2018-06-11 NOTE — Progress Notes (Signed)
Subjective:    Patient ID: Michael Payne, male    DOB: 04-Jul-1971, 47 y.o.   MRN: [id number]  HPI This is a 47 yo male who presents today with right hip pain. Started 4 days ago, a day after riding to [place]. Took ibuprofen 800 mg with little relief. No weakness or falls. Has had similar pain in past, good relief with celecoxib. Requests RX for heavy duty cane. Can't find his.   Has had some cold symptoms x 2 days, stuffy nose, nasal congestion. No fever. Running through the family.   Morbid obesity- continues to follow up with bariatrics, trying to get weight under 500 pounds to qualify for surgery. Seems to have plateaued. Admits to overeating on recent vacation. Asking about seat belt use. Very uncomfortable and constricting to use, requesting note to allow him to not wear. Mobility very difficult, requesting handicap placard.   Needs referral for podiatry, thickened toenails, he is unable to cut.     Past Medical History:  Diagnosis Date  . Anemia    borderline anemia, was instructed to eat more iron filled foods  . GERD (gastroesophageal reflux disease)    lactose intolerance  . HTN (hypertension)    Past Surgical History:  Procedure Laterality Date  . FOOT SURGERY    . KNEE ARTHROSCOPY WITH MEDIAL MENISECTOMY Left 12/16/2014   Procedure: KNEE ARTHROSCOPY WITH MEDIAL MENISECTOMY;  Surgeon: Sheral Apley, MD;  Location: [facility] OR;  Service: Orthopedics;  Laterality: Left;   Family History  Problem Relation Age of Onset  . Heart attack Mother 2  . Lung cancer Father   . Cancer Father   . CAD Maternal Grandfather    Social History   Tobacco Use  . Smoking status: Former Smoker    Packs/day: 2.00    Years: 22.00    Pack years: 44.00    Types: Cigarettes    Last attempt to quit: 02/24/2011    Years since quitting: 7.2  . Smokeless tobacco: Never Used  Substance Use Topics  . Alcohol use: Yes    Comment: Weekends/beer-- has slowed down  alot  . Drug use: Not  Currently    Types: Marijuana    Comment: former -  quit in 2012      Review of Systems Per HPI    Objective:   Physical Exam  Constitutional: He is oriented to person, place, and time. He appears well-developed and well-nourished.  HENT:  Head: Normocephalic and atraumatic.  Right Ear: External ear normal.  Left Ear: External ear normal.  Nose: Mucosal edema and rhinorrhea present.  Mouth/Throat: Oropharynx is clear and moist.  Eyes: Conjunctivae are normal.  Cardiovascular: Normal rate, regular rhythm and normal heart sounds.  Pulmonary/Chest: Effort normal. No respiratory distress.  Lung sounds distant.   Musculoskeletal: He exhibits no edema (no pedal).       Right hip: He exhibits decreased range of motion (decreased flexion) and tenderness (right glute). He exhibits normal strength and no bony tenderness.  Neurological: He is alert and oriented to person, place, and time.  Skin: Skin is warm and dry.  Psychiatric: He has a normal mood and affect. His behavior is normal. Judgment and thought content normal.  Vitals reviewed.    Pulse 94   Temp 98.6 F (37 C) (Oral)   Ht 6\' 4"  (1.93 m)   Wt (!) 513 lb 12.8 oz (233.1 kg)   SpO2 96%   BMI 62.54 kg/m  Wt Readings from Last  3 Encounters:  06/11/18 (!) 513 lb 12.8 oz (233.1 kg)  04/11/18 (!) 512 lb (232.2 kg)  02/18/15 (!) 515 lb (233.6 kg)        Assessment & Plan:  1. Acute right hip pain - heat, stretches, get back to water exercises - celecoxib (CELEBREX) 100 MG capsule; Take 1 capsule (100 mg total) by mouth 2 (two) times daily.  Dispense: 60 capsule; Refill: 1  2. Acquired dysmorphic toenail - Ambulatory referral to Podiatry  3. Viral URI - continue symptomatic treatment, RTC precautions reviewed  4. Morbid obesity (HCC) - written prescription for heavy duty cane provided - discussed seatbelt extender and showed him pictures- don't want him not wearing seat belt for safety reasons - handicap placard  for 6 months- discussed that this is temporary measure until weight loss surgery recovery and improved mobilitiy   Olean Ree, FNP-BC  [facility] [place] [facility], [facility]  06/12/2018 8:32 AM

## 2018-06-11 NOTE — Patient Instructions (Signed)
Good to see you today  I have sent some generic Celebrex to your pharmacy for your hip. You can take 1 to 2 tablets daily as needed  Do hip stretches daily  Keep up the exercising and watching diet- I know you can reach your goal to get your surgery!

## 2018-06-11 NOTE — Telephone Encounter (Signed)
I spoke with pt; pt request med for rt hip; no injury just had hip pain for awhile. Pt said had taken hydrocodone without relief. Pt scheduled appt with Harlin Heys FNP 06/11/18 at 4PM.

## 2018-06-12 ENCOUNTER — Encounter: Payer: Self-pay | Admitting: Family Medicine

## 2018-06-26 ENCOUNTER — Ambulatory Visit: Payer: Medicare HMO | Admitting: Sports Medicine

## 2018-07-04 ENCOUNTER — Other Ambulatory Visit: Payer: Self-pay | Admitting: Family Medicine

## 2018-07-04 ENCOUNTER — Telehealth: Payer: Self-pay | Admitting: Family Medicine

## 2018-07-04 DIAGNOSIS — M25551 Pain in right hip: Secondary | ICD-10-CM

## 2018-07-04 MED ORDER — CELECOXIB 200 MG PO CAPS: 200.0000 mg | ORAL_CAPSULE | Freq: Two times a day (BID) | ORAL | 1 refills | Status: DC | PRN

## 2018-07-04 NOTE — Telephone Encounter (Signed)
Copied from CRM [id number]. Topic: Quick Communication - See Telephone Encounter >> Jul 04, 2018 10:45 AM Luanna Cole wrote: CRM for notification. See Telephone encounter for: 07/04/18. Pt called and stated that he has been taking 400 mg of celecoxib (CELEBREX) 100 MG capsule [[id number]] . Pt states that the RX should be 200 mg twice a day. Pt states that he has no more medication left. Please advise CB# [phone]

## 2018-07-04 NOTE — Telephone Encounter (Signed)
Pt last seen 06/11/18.Please advise.  

## 2018-07-04 NOTE — Telephone Encounter (Signed)
Chart reviewed and 200 mg bid prn dose sent to patient's pharmacy.

## 2018-07-07 DIAGNOSIS — G4733 Obstructive sleep apnea (adult) (pediatric): Secondary | ICD-10-CM | POA: Diagnosis not present

## 2018-07-10 ENCOUNTER — Ambulatory Visit (INDEPENDENT_AMBULATORY_CARE_PROVIDER_SITE_OTHER): Payer: Medicare HMO | Admitting: Sports Medicine

## 2018-07-10 ENCOUNTER — Encounter: Payer: Self-pay | Admitting: Sports Medicine

## 2018-07-10 VITALS — BP 142/88 | HR 80 | Resp 16 | Ht 76.0 in | Wt >= 6400 oz

## 2018-07-10 DIAGNOSIS — M79675 Pain in left toe(s): Secondary | ICD-10-CM | POA: Diagnosis not present

## 2018-07-10 DIAGNOSIS — E8881 Metabolic syndrome: Secondary | ICD-10-CM

## 2018-07-10 DIAGNOSIS — M79674 Pain in right toe(s): Secondary | ICD-10-CM | POA: Diagnosis not present

## 2018-07-10 DIAGNOSIS — B351 Tinea unguium: Secondary | ICD-10-CM

## 2018-07-10 DIAGNOSIS — L608 Other nail disorders: Secondary | ICD-10-CM | POA: Diagnosis not present

## 2018-07-10 NOTE — Progress Notes (Signed)
Subjective: Michael Payne is a 47 y.o. male patient seen today in office with complaint of mildly painful thickened and discolored nails however most pain that increases when his toenails grows out to the skin on both big toes which makes the pain more moderate. Patient is desiring treatment for nail changes; has tried OTC topicals/Medication and pedicures in the past with no improvement. Reports that nails are becoming difficult to manage because of the thickness and how the first toes are growing. Patient has no other pedal complaints at this time.   Review of Systems  Skin:       Nail pain  All other systems reviewed and are negative.    Patient Active Problem List   Diagnosis Date Noted  . Severe obstructive sleep apnea 02/22/2018  . Hypertriglyceridemia 08/24/2017  . Low HDL (under 40) 08/24/2017  . Metabolic syndrome 08/24/2017  . Morbid obesity (HCC) 10/14/2013  . HTN (hypertension) 10/14/2013  . Chest pain 07/26/2012    Current Outpatient Medications on File Prior to Visit  Medication Sig Dispense Refill  . allopurinol (ZYLOPRIM) 300 MG tablet Take 1 tablet (300 mg total) by mouth 2 (two) times daily as needed. 60 tablet 1  . AMITIZA 24 MCG capsule TAKE 1 CAPSULE BY MOUTH TWICE A DAY WITH FOOD AND WATER  5  . amLODipine (NORVASC) 5 MG tablet TAKE 1 TABLET BY MOUTH EVERY DAY    . celecoxib (CELEBREX) 200 MG capsule Take 1 capsule (200 mg total) by mouth 2 (two) times daily as needed for moderate pain. 60 capsule 1  . ibuprofen (ADVIL,MOTRIN) 200 MG tablet Take 800 mg by mouth every 6 (six) hours as needed (pain).    . Melatonin 5 MG TABS Take by mouth.    . triamterene-hydrochlorothiazide (MAXZIDE-25) 37.5-25 MG tablet Take 1 tablet by mouth daily.  4   No current facility-administered medications on file prior to visit.     Allergies  Allergen Reactions  . Aspirin Other (See Comments)    Upset stomach    Objective: Physical Exam  General: Well developed,  nourished, no acute distress, awake, alert and oriented x 3 obese habitus  Vascular: Dorsalis pedis artery 2/4 bilateral, Posterior tibial artery 1/4 bilateral, skin temperature warm to warm proximal to distal bilateral lower extremities, no varicosities, pedal hair present bilateral.  Neurological: Gross sensation present via light touch bilateral.   Dermatological: Skin is warm, dry, and supple bilateral, Nails 1-10 are tender, short thick, and discolored with mild subungal debris and pincer deformity bilateral hallux, no webspace macerations present bilateral, no open lesions present bilateral, no callus/corns/hyperkeratotic tissue present bilateral. No signs of infection bilateral.  Musculoskeletal: Asymptomatic hammertoe boney deformities noted bilateral. Muscular strength within normal limits without painon range of motion. No pain with calf compression bilateral.  Assessment and Plan:  Problem List Items Addressed This Visit      Other   Morbid obesity (HCC)   Metabolic syndrome    Other Visit Diagnoses    Pain due to onychomycosis of toenails of both feet    -  Primary   Nail fungus       Relevant Orders   Culture, fungus without smear     -Examined patient -Discussed treatment options for painful dystrophic nails  -Fungal culture was obtained by removing a portion of the hard nail itself from each of the involved toenails using a sterile nail nipper and sent to [facility] lab. Patient tolerated the biopsy procedure well without discomfort or need  for anesthesia.  -Patient to return in 4 weeks for follow up evaluation and discussion of fungal culture results or sooner if symptoms worsen.  Asencion Islam, DPM

## 2018-07-24 DIAGNOSIS — M25561 Pain in right knee: Secondary | ICD-10-CM | POA: Diagnosis not present

## 2018-07-24 DIAGNOSIS — G894 Chronic pain syndrome: Secondary | ICD-10-CM | POA: Diagnosis not present

## 2018-07-24 DIAGNOSIS — G4733 Obstructive sleep apnea (adult) (pediatric): Secondary | ICD-10-CM | POA: Diagnosis not present

## 2018-07-24 DIAGNOSIS — M25562 Pain in left knee: Secondary | ICD-10-CM | POA: Diagnosis not present

## 2018-07-27 ENCOUNTER — Ambulatory Visit: Payer: Medicare HMO | Admitting: Family Medicine

## 2018-07-27 ENCOUNTER — Ambulatory Visit: Payer: Self-pay | Admitting: Family Medicine

## 2018-07-27 DIAGNOSIS — Z0289 Encounter for other administrative examinations: Secondary | ICD-10-CM

## 2018-07-28 ENCOUNTER — Other Ambulatory Visit: Payer: Self-pay | Admitting: Family Medicine

## 2018-07-28 DIAGNOSIS — M25551 Pain in right hip: Secondary | ICD-10-CM

## 2018-08-01 DIAGNOSIS — G4733 Obstructive sleep apnea (adult) (pediatric): Secondary | ICD-10-CM | POA: Diagnosis not present

## 2018-08-07 DIAGNOSIS — G4733 Obstructive sleep apnea (adult) (pediatric): Secondary | ICD-10-CM | POA: Diagnosis not present

## 2018-08-13 ENCOUNTER — Other Ambulatory Visit: Payer: Self-pay | Admitting: Family Medicine

## 2018-08-13 DIAGNOSIS — M25551 Pain in right hip: Secondary | ICD-10-CM

## 2018-08-14 ENCOUNTER — Other Ambulatory Visit: Payer: Self-pay | Admitting: Family Medicine

## 2018-08-14 ENCOUNTER — Encounter: Payer: Self-pay | Admitting: Sports Medicine

## 2018-08-14 ENCOUNTER — Ambulatory Visit (INDEPENDENT_AMBULATORY_CARE_PROVIDER_SITE_OTHER): Payer: Medicare HMO | Admitting: Sports Medicine

## 2018-08-14 DIAGNOSIS — M25551 Pain in right hip: Secondary | ICD-10-CM

## 2018-08-14 DIAGNOSIS — B351 Tinea unguium: Secondary | ICD-10-CM

## 2018-08-14 DIAGNOSIS — E8881 Metabolic syndrome: Secondary | ICD-10-CM | POA: Diagnosis not present

## 2018-08-14 DIAGNOSIS — M79674 Pain in right toe(s): Secondary | ICD-10-CM

## 2018-08-14 DIAGNOSIS — M79675 Pain in left toe(s): Secondary | ICD-10-CM

## 2018-08-14 NOTE — Progress Notes (Signed)
Subjective: Michael Payne is a 47 y.o. male patient seen today in office for fungal culture results. Patient has no other pedal complaints at this time.   Patient Active Problem List   Diagnosis Date Noted  . Severe obstructive sleep apnea 02/22/2018  . Hypertriglyceridemia 08/24/2017  . Low HDL (under 40) 08/24/2017  . Metabolic syndrome 08/24/2017  . Morbid obesity (HCC) 10/14/2013  . HTN (hypertension) 10/14/2013  . Chest pain 07/26/2012    Current Outpatient Medications on File Prior to Visit  Medication Sig Dispense Refill  . Acetaminophen 500 MG coapsule     . allopurinol (ZYLOPRIM) 300 MG tablet Take 1 tablet (300 mg total) by mouth 2 (two) times daily as needed. 60 tablet 1  . AMITIZA 24 MCG capsule TAKE 1 CAPSULE BY MOUTH TWICE A DAY WITH FOOD AND WATER  5  . amLODipine (NORVASC) 5 MG tablet TAKE 1 TABLET BY MOUTH EVERY DAY    . benzocaine-menthol (CHLORAEPTIC) 6-10 MG lozenge     . Calcium-Vitamin D-Vitamin K (CHEWABLE CALCIUM PO)     . CALCIUM/VITAMIN D3/ADULT GUMMY 250-100-500 MG-MG-UNIT CHEW     . celecoxib (CELEBREX) 200 MG capsule TAKE 1 CAPSULE (200 MG TOTAL) BY MOUTH 2 (TWO) TIMES DAILY AS NEEDED FOR MODERATE PAIN. 60 capsule 1  . ibuprofen (ADVIL,MOTRIN) 200 MG tablet Take 800 mg by mouth every 6 (six) hours as needed (pain).    . Melatonin 5 MG TABS Take by mouth.    . Melatonin 5 MG TABS     . tiZANidine (ZANAFLEX) 2 MG tablet Take 2 mg by mouth 3 (three) times daily as needed.  1  . triamterene-hydrochlorothiazide (MAXZIDE-25) 37.5-25 MG tablet Take 1 tablet by mouth daily.  4   No current facility-administered medications on file prior to visit.     Allergies  Allergen Reactions  . Aspirin Other (See Comments)    Upset stomach    Objective: Physical Exam  General: Well developed, nourished, no acute distress, awake, alert and oriented x 3  Vascular: Dorsalis pedis artery 2/4 bilateral, Posterior tibial artery 1/4 bilateral, skin temperature warm to  warm proximal to distal bilateral lower extremities, no varicosities, pedal hair present bilateral.  Neurological: Gross sensation present via light touch bilateral.   Dermatological: Skin is warm, dry, and supple bilateral, Nails 1-10 are tender, short thick, and discolored with mild subungal debris, no webspace macerations present bilateral, no open lesions present bilateral, no callus/corns/hyperkeratotic tissue present bilateral. No signs of infection bilateral.  Musculoskeletal: Asymptomatic hammertoe deformities noted bilateral. Muscular strength within normal limits without painon range of motion. No pain with calf compression bilateral.  Fungal culture suggestive of microtrauma  Assessment and Plan:  Problem List Items Addressed This Visit      Other   Metabolic syndrome    Other Visit Diagnoses    Pain due to onychomycosis of toenails of both feet    -  Primary      -Examined patient -Discussed treatment options for painful mycotic nails which fungal culture is negative and suggestive of microtrauma -Advised patient to refrain from wearing shoes that are ill fitting and to try to treat or to help nourish the nails -Advised good hygiene habits -Patient to return as needed or sooner if symptoms worsen.  Asencion Islam, DPM

## 2018-08-27 ENCOUNTER — Encounter: Payer: Self-pay | Admitting: Family Medicine

## 2018-08-27 ENCOUNTER — Ambulatory Visit (INDEPENDENT_AMBULATORY_CARE_PROVIDER_SITE_OTHER): Payer: Medicare HMO | Admitting: Family Medicine

## 2018-08-27 VITALS — BP 147/107 | HR 87 | Temp 98.7°F | Ht 76.0 in | Wt >= 6400 oz

## 2018-08-27 DIAGNOSIS — M25551 Pain in right hip: Secondary | ICD-10-CM | POA: Diagnosis not present

## 2018-08-27 MED ORDER — HYDROCODONE-ACETAMINOPHEN 10-325 MG PO TABS: 1.0000 | ORAL_TABLET | Freq: Three times a day (TID) | ORAL | 0 refills | Status: AC | PRN

## 2018-08-27 NOTE — Progress Notes (Signed)
Subjective:    Patient ID: Michael Payne, male    DOB: 1971/03/28, 47 y.o.   MRN: [id number]  HPI This is a 47 yo male who presents today with continued right hip pain. Was doing better until he fell on it about 3 weeks ago. Has had to use his cane. No bruising, some swelling, improved with ice. Pain all day and some at night depending on how he lays on it. Pain is aching and sharp, not bad in the afternoon. Can't work out due to pain.  Currently taking celebrex 200 mg twice a day and acetaminophen without relief. Was seeing pain management in [place] Roselie Awkward, MD), last visit 2 months ago. Was getting hydrocodone 10 mg to take prn. Had problems with his insurance coverage and has not been able to go back to Dr. Tollie Eth, but would prefer to. Went to new provider but was told he wouldn't prescribe opioids due to weight and gave him tizanidine which only makes him sleepy.  Weight has plateaued, hasn't been very compliant with diet. Increased stress at home, wife had recent surgery.   Past Medical History:  Diagnosis Date  . Anemia    borderline anemia, was instructed to eat more iron filled foods  . GERD (gastroesophageal reflux disease)    lactose intolerance  . HTN (hypertension)    Past Surgical History:  Procedure Laterality Date  . FOOT SURGERY    . KNEE ARTHROSCOPY WITH MEDIAL MENISECTOMY Left 12/16/2014   Procedure: KNEE ARTHROSCOPY WITH MEDIAL MENISECTOMY;  Surgeon: Sheral Apley, MD;  Location: [facility] OR;  Service: Orthopedics;  Laterality: Left;   Family History  Problem Relation Age of Onset  . Heart attack Mother 63  . Lung cancer Father   . Cancer Father   . CAD Maternal Grandfather    Social History   Tobacco Use  . Smoking status: Former Smoker    Packs/day: 2.00    Years: 22.00    Pack years: 44.00    Types: Cigarettes    Last attempt to quit: 02/24/2011    Years since quitting: 7.5  . Smokeless tobacco: Never Used  Substance Use Topics  . Alcohol use:  Yes    Comment: Weekends/beer-- has slowed down  alot  . Drug use: Not Currently    Types: Marijuana    Comment: former -  quit in 2012      Review of Systems Per HPI    Objective:   Physical Exam  Constitutional: He is oriented to person, place, and time. He appears well-developed and well-nourished. No distress.  Morbidly obese.   HENT:  Head: Normocephalic and atraumatic.  Cardiovascular: Normal rate.  Pulmonary/Chest: Effort normal.  Musculoskeletal:  Gait stable. ROM evaluation limited by body habitus- unable to fully exam on table. Right hip with tenderness over gluteus. Pain with flexion.   Neurological: He is alert and oriented to person, place, and time.  Skin: Skin is warm and dry. He is not diaphoretic.  Psychiatric: He has a normal mood and affect. His behavior is normal. Judgment and thought content normal.      BP (!) 147/107 (BP Location: Right Arm, Patient Position: Sitting, Cuff Size: Large)   Pulse 87   Temp 98.7 F (37.1 C) (Oral)   Ht 6\' 4"  (1.93 m)   Wt (!) 513 lb 1.9 oz (232.7 kg)   SpO2 96%   BMI 62.46 kg/m  Wt Readings from Last 3 Encounters:  08/27/18 (!) 513 lb 1.9 oz (  232.7 kg)  07/10/18 (!) 513 lb (232.7 kg)  06/11/18 (!) 513 lb 12.8 oz (233.1 kg)   BP Readings from Last 3 Encounters:  08/27/18 (!) 147/107  07/10/18 (!) 142/88  04/11/18 126/78       Assessment & Plan:  1. Acute right hip pain - acute on chronic pain - I have given him a 5 day supply of hydrocodone acetaminophen after reviewing the NCCSD and finding no red flags.  - Encouraged him to schedule with pain management - HYDROcodone-acetaminophen (NORCO) 10-325 MG tablet; Take 1 tablet by mouth every 8 (eight) hours as needed for up to 5 days.  Dispense: 15 tablet; Refill: 0  2. Morbid obesity (HCC) - he has had some barriers to continued weight loss and we discussed these. I encouraged him to follow up with bariatrics, work on healthy food choices, continue to do what he  can at the gym   Olean Ree, FNP-BC  [place] [facility], [facility]  08/27/2018 1:36 PM

## 2018-08-27 NOTE — Patient Instructions (Signed)
I have sent in a 5 day supply of hydrocodone-acetaminophen 10-325 mg to your pharmacy  This is a short term supply to get you through until you can get back in with Dr. Tollie Eth  Continue to stretch, walk in pool, do arm exercises

## 2018-09-04 ENCOUNTER — Other Ambulatory Visit: Payer: Self-pay

## 2018-09-04 DIAGNOSIS — M25551 Pain in right hip: Secondary | ICD-10-CM

## 2018-09-04 NOTE — Telephone Encounter (Signed)
Name of Medication: Hydrocodone apap 10-325 Name of Pharmacy: [facility]. Last [place] or Written Date and Quantity: # 15 on 08/27/18 Last Office Visit and Type: 08/27/18 hip pain Next Office Visit and Type: 10/19/18 annual exam Last Controlled Substance Agreement Date: none Last QAS:TMHD  Pt said the first available appt could get 09/26/17. Pt is out of pain med and request refill of Hydrocodone apap until pt can see pain mgt. In 21 days.Please advise. Pt is aware D Leone Payor FNP is out of office today.

## 2018-09-05 NOTE — Telephone Encounter (Signed)
Please call patient and tell him that I am unable to refill his pain medication. I only provided a one time prescription. He did not have any pain medication for several months before I gave him the last prescription.

## 2018-09-06 DIAGNOSIS — G4733 Obstructive sleep apnea (adult) (pediatric): Secondary | ICD-10-CM | POA: Diagnosis not present

## 2018-09-06 NOTE — Telephone Encounter (Signed)
Pt called and notified as instructed and pt voiced understanding.. Pt said he could not be seen at pain mgt any sooner than 09/26/17. Pt said he might try UC or ED for pain med.

## 2018-09-25 DIAGNOSIS — Z6841 Body Mass Index (BMI) 40.0 and over, adult: Secondary | ICD-10-CM | POA: Diagnosis not present

## 2018-09-25 DIAGNOSIS — I10 Essential (primary) hypertension: Secondary | ICD-10-CM | POA: Diagnosis not present

## 2018-09-25 DIAGNOSIS — R635 Abnormal weight gain: Secondary | ICD-10-CM | POA: Diagnosis not present

## 2018-09-25 DIAGNOSIS — E669 Obesity, unspecified: Secondary | ICD-10-CM | POA: Diagnosis not present

## 2018-09-25 DIAGNOSIS — E785 Hyperlipidemia, unspecified: Secondary | ICD-10-CM | POA: Diagnosis not present

## 2018-09-25 DIAGNOSIS — R799 Abnormal finding of blood chemistry, unspecified: Secondary | ICD-10-CM | POA: Diagnosis not present

## 2018-09-26 DIAGNOSIS — G894 Chronic pain syndrome: Secondary | ICD-10-CM | POA: Diagnosis not present

## 2018-09-26 DIAGNOSIS — M25559 Pain in unspecified hip: Secondary | ICD-10-CM | POA: Diagnosis not present

## 2018-09-26 DIAGNOSIS — M25569 Pain in unspecified knee: Secondary | ICD-10-CM | POA: Diagnosis not present

## 2018-10-03 DIAGNOSIS — Z6841 Body Mass Index (BMI) 40.0 and over, adult: Secondary | ICD-10-CM | POA: Diagnosis not present

## 2018-10-03 DIAGNOSIS — G4733 Obstructive sleep apnea (adult) (pediatric): Secondary | ICD-10-CM | POA: Diagnosis not present

## 2018-10-03 DIAGNOSIS — Z9989 Dependence on other enabling machines and devices: Secondary | ICD-10-CM | POA: Diagnosis not present

## 2018-10-03 DIAGNOSIS — I10 Essential (primary) hypertension: Secondary | ICD-10-CM | POA: Diagnosis not present

## 2018-10-07 DIAGNOSIS — G4733 Obstructive sleep apnea (adult) (pediatric): Secondary | ICD-10-CM | POA: Diagnosis not present

## 2018-10-10 ENCOUNTER — Ambulatory Visit: Payer: Self-pay

## 2018-10-12 ENCOUNTER — Other Ambulatory Visit: Payer: Self-pay | Admitting: Primary Care

## 2018-10-12 DIAGNOSIS — M1A9XX0 Chronic gout, unspecified, without tophus (tophi): Secondary | ICD-10-CM

## 2018-10-12 DIAGNOSIS — E781 Pure hyperglyceridemia: Secondary | ICD-10-CM

## 2018-10-12 DIAGNOSIS — I10 Essential (primary) hypertension: Secondary | ICD-10-CM

## 2018-10-12 DIAGNOSIS — R7303 Prediabetes: Secondary | ICD-10-CM

## 2018-10-13 ENCOUNTER — Other Ambulatory Visit: Payer: Self-pay | Admitting: Family Medicine

## 2018-10-13 DIAGNOSIS — M25551 Pain in right hip: Secondary | ICD-10-CM

## 2018-10-15 NOTE — Telephone Encounter (Signed)
Electronic refill request Celebrex Last office visit 08/27/18 Last refill 07/30/18 #60/1 See allergy/contraindication

## 2018-10-16 ENCOUNTER — Telehealth: Payer: Self-pay | Admitting: *Deleted

## 2018-10-16 MED ORDER — COLCHICINE 0.6 MG PO TABS: 0.6000 mg | ORAL_TABLET | Freq: Every day | ORAL | 0 refills | Status: DC | PRN

## 2018-10-16 NOTE — Telephone Encounter (Signed)
Patient advised.

## 2018-10-16 NOTE — Telephone Encounter (Signed)
Sent.  Don't take with other nsaids.  He has f/u labs pending.

## 2018-10-16 NOTE — Telephone Encounter (Signed)
Take colchicine daily as needed for flare.  If feeling worse then needs recheck, potentially prior to scheduled OV.  Thanks.

## 2018-10-16 NOTE — Telephone Encounter (Signed)
Patient phoned in saying that he is having a flare up with his gout and the Allopurinol is not helping.  Patient asks if something could be sent in to help relieve the pain.  Patient has lab work scheduled 10/17/2018 and a CPE on Friday 10/19/2018 but requests medication before that time.

## 2018-10-16 NOTE — Addendum Note (Signed)
Addended by: Joaquim NamUNCAN, Arkie Tagliaferro S on: 10/16/2018 02:03 PM   Modules accepted: Orders

## 2018-10-17 ENCOUNTER — Encounter: Payer: Self-pay | Admitting: Family Medicine

## 2018-10-17 ENCOUNTER — Ambulatory Visit: Payer: Self-pay

## 2018-10-19 ENCOUNTER — Encounter: Payer: Self-pay | Admitting: Family Medicine

## 2018-10-24 DIAGNOSIS — M25559 Pain in unspecified hip: Secondary | ICD-10-CM | POA: Diagnosis not present

## 2018-10-24 DIAGNOSIS — M25569 Pain in unspecified knee: Secondary | ICD-10-CM | POA: Diagnosis not present

## 2018-10-24 DIAGNOSIS — G894 Chronic pain syndrome: Secondary | ICD-10-CM | POA: Diagnosis not present

## 2018-10-24 DIAGNOSIS — Z79891 Long term (current) use of opiate analgesic: Secondary | ICD-10-CM | POA: Diagnosis not present

## 2018-10-29 ENCOUNTER — Ambulatory Visit (INDEPENDENT_AMBULATORY_CARE_PROVIDER_SITE_OTHER): Payer: Medicare HMO

## 2018-10-29 DIAGNOSIS — I10 Essential (primary) hypertension: Secondary | ICD-10-CM

## 2018-10-29 DIAGNOSIS — E781 Pure hyperglyceridemia: Secondary | ICD-10-CM

## 2018-10-29 DIAGNOSIS — R7303 Prediabetes: Secondary | ICD-10-CM | POA: Diagnosis not present

## 2018-10-29 DIAGNOSIS — M1A9XX0 Chronic gout, unspecified, without tophus (tophi): Secondary | ICD-10-CM | POA: Diagnosis not present

## 2018-10-29 LAB — LIPID PANEL
Cholesterol: 139 mg/dL (ref 0–200)
HDL: 35.6 mg/dL — ABNORMAL LOW (ref >39.00)
NonHDL: 103.55
Total CHOL/HDL Ratio: 4
Triglycerides: 225 mg/dL — ABNORMAL HIGH (ref 0.0–149.0)
VLDL: 45 mg/dL — ABNORMAL HIGH (ref 0.0–40.0)

## 2018-10-29 LAB — COMPREHENSIVE METABOLIC PANEL
ALT: 23 U/L (ref 0–53)
AST: 17 U/L (ref 0–37)
Albumin: 4.1 g/dL (ref 3.5–5.2)
Alkaline Phosphatase: 66 U/L (ref 39–117)
BUN: 18 mg/dL (ref 6–23)
CALCIUM: 10.1 mg/dL (ref 8.4–10.5)
CO2: 32 meq/L (ref 19–32)
Chloride: 102 mEq/L (ref 96–112)
Creatinine, Ser: 1.02 mg/dL (ref 0.40–1.50)
GFR: 94.68 mL/min (ref >60.00)
Glucose, Bld: 89 mg/dL (ref 70–99)
Potassium: 4.9 mEq/L (ref 3.5–5.1)
Sodium: 140 mEq/L (ref 135–145)
Total Bilirubin: 0.3 mg/dL (ref 0.2–1.2)
Total Protein: 7.7 g/dL (ref 6.0–8.3)

## 2018-10-29 LAB — HEMOGLOBIN A1C: Hgb A1c MFr Bld: 5.6 % (ref 4.6–6.5)

## 2018-10-29 LAB — LDL CHOLESTEROL, DIRECT: Direct LDL: 76 mg/dL

## 2018-10-29 LAB — URIC ACID: Uric Acid, Serum: 5.4 mg/dL (ref 4.0–7.8)

## 2018-10-29 NOTE — Progress Notes (Signed)
Labs only visit. No charge for AWV. Medicare Part B benefits began 02/2018 per insurance card.

## 2018-10-30 DIAGNOSIS — G4733 Obstructive sleep apnea (adult) (pediatric): Secondary | ICD-10-CM | POA: Diagnosis not present

## 2018-10-30 DIAGNOSIS — I10 Essential (primary) hypertension: Secondary | ICD-10-CM | POA: Diagnosis not present

## 2018-10-30 DIAGNOSIS — Z9989 Dependence on other enabling machines and devices: Secondary | ICD-10-CM | POA: Diagnosis not present

## 2018-10-30 DIAGNOSIS — E8881 Metabolic syndrome: Secondary | ICD-10-CM | POA: Diagnosis not present

## 2018-10-30 DIAGNOSIS — Z713 Dietary counseling and surveillance: Secondary | ICD-10-CM | POA: Diagnosis not present

## 2018-10-30 DIAGNOSIS — E781 Pure hyperglyceridemia: Secondary | ICD-10-CM | POA: Diagnosis not present

## 2018-10-30 DIAGNOSIS — Z6841 Body Mass Index (BMI) 40.0 and over, adult: Secondary | ICD-10-CM | POA: Diagnosis not present

## 2018-10-30 DIAGNOSIS — E786 Lipoprotein deficiency: Secondary | ICD-10-CM | POA: Diagnosis not present

## 2018-11-05 ENCOUNTER — Ambulatory Visit (INDEPENDENT_AMBULATORY_CARE_PROVIDER_SITE_OTHER): Payer: Medicare HMO | Admitting: Family Medicine

## 2018-11-05 DIAGNOSIS — M2550 Pain in unspecified joint: Secondary | ICD-10-CM | POA: Diagnosis not present

## 2018-11-05 DIAGNOSIS — I10 Essential (primary) hypertension: Secondary | ICD-10-CM

## 2018-11-05 NOTE — Progress Notes (Signed)
Pre visit review using our clinic review tool, if applicable. No additional management support is needed unless otherwise documented below in the visit note. 

## 2018-11-05 NOTE — Progress Notes (Deleted)
Subjective:   Michael Payne is a 48 y.o. male who presents for a Welcome to Medicare exam.   Review of Systems: Dry cough, occasional phlegm production. Thought it was his medication but lingered after he stopped it.  Joint pain continues, sees ortho Some weight loss, has re-established with Bariatrics for surgery consideration this year       Objective:      Medications Outpatient Encounter Medications as of 11/05/2018  Medication Sig  . Acetaminophen 500 MG coapsule   . allopurinol (ZYLOPRIM) 300 MG tablet Take 1 tablet (300 mg total) by mouth 2 (two) times daily as needed.  . AMITIZA 24 MCG capsule TAKE 1 CAPSULE BY MOUTH TWICE A DAY WITH FOOD AND WATER  . amLODipine (NORVASC) 5 MG tablet TAKE 1 TABLET BY MOUTH EVERY DAY  . benzocaine-menthol (CHLORAEPTIC) 6-10 MG lozenge   . Calcium-Vitamin D-Vitamin K (CHEWABLE CALCIUM PO)   . CALCIUM/VITAMIN D3/ADULT GUMMY 250-100-500 MG-MG-UNIT CHEW   . celecoxib (CELEBREX) 200 MG capsule TAKE 1 CAPSULE (200 MG TOTAL) BY MOUTH 2 (TWO) TIMES DAILY AS NEEDED FOR MODERATE PAIN.  Marland Kitchen colchicine 0.6 MG tablet Take 1 tablet (0.6 mg total) by mouth daily as needed (okay to fill with colchicine/colcrys/mitigare).  . Melatonin 5 MG TABS Take by mouth.  . Melatonin 5 MG TABS   . tiZANidine (ZANAFLEX) 2 MG tablet Take 2 mg by mouth 3 (three) times daily as needed.  . triamterene-hydrochlorothiazide (MAXZIDE-25) 37.5-25 MG tablet Take 1 tablet by mouth daily.   No facility-administered encounter medications on file as of 11/05/2018.      History: Past Medical History:  Diagnosis Date  . Anemia    borderline anemia, was instructed to eat more iron filled foods  . GERD (gastroesophageal reflux disease)    lactose intolerance  . HTN (hypertension)    Past Surgical History:  Procedure Laterality Date  . FOOT SURGERY    . KNEE ARTHROSCOPY WITH MEDIAL MENISECTOMY Left 12/16/2014   Procedure: KNEE ARTHROSCOPY WITH MEDIAL MENISECTOMY;  Surgeon:  Sheral Apley, MD;  Location: [facility];  Service: Orthopedics;  Laterality: Left;    Family History  Problem Relation Age of Onset  . Heart attack Mother 86  . Lung cancer Father   . Cancer Father   . CAD Maternal Grandfather    Social History   Occupational History  . Occupation: Disabled    Employer:   Tobacco Use  . Smoking status: Former Smoker    Packs/day: 2.00    Years: 22.00    Pack years: 44.00    Types: Cigarettes    Last attempt to quit: 02/24/2011    Years since quitting: 7.7  . Smokeless tobacco: Never Used  Substance and Sexual Activity  . Alcohol use: Yes    Comment: Weekends/beer-- has slowed down  alot  . Drug use: Not Currently    Types: Marijuana    Comment: former -  quit in 2012  . Sexual activity: Yes    Partners: Female   Tobacco Counseling Counseling given: Not Answered   Immunizations and Health Maintenance Immunization History  Administered Date(s) Administered  . Tdap 02/03/2012   There are no preventive care reminders to display for this patient.  Activities of Daily Living No flowsheet data found.  Physical Exam   Physical Exam  Constitutional: Oriented to person, place, and time. He appears well-developed and well-nourished. Morbidly obese.  HENT:  Head: Normocephalic and atraumatic.  Eyes: Conjunctivae are normal.  Neck: Normal  range of motion. Neck supple.  Cardiovascular: Normal rate, regular rhythm and normal heart sounds.   Pulmonary/Chest: Effort normal and breath sounds normal.  Musculoskeletal: Normal range of motion.  Neurological: Alert and oriented to person, place, and time.  Skin: Skin is warm and dry.  Psychiatric: Normal mood and affect. Behavior is normal. Judgment and thought content normal.  Vitals reviewed.   Advanced Directives:      Assessment:    This is a routine wellness  examination for this patient .  Vision/Hearing screen  Dietary issues and exercise activities discussed:   Continue water  exercise at least 3 times a week and continue supervised weight loss with [facility]  Goals   None     Depression Screen Depression screen [facility] 2/9 05/27/2015 02/18/2015 11/12/2014  Decreased Interest 0 0 0  Down, Depressed, Hopeless 0 0 0  PHQ - 2 Score 0 0 0      Fall Risk Fall Risk  05/27/2015  Falls in the past year? No    Cognitive Function        Patient Care Team: Emi Belfast, FNP as PCP - General (Nurse Practitioner)     Plan:   ***  I have personally reviewed and noted the following in the patient's chart:   . Medical and social history . Use of alcohol, tobacco or illicit drugs  . Current medications and supplements . Functional ability and status . Nutritional status . Physical activity . Advanced directives . List of other physicians . Hospitalizations, surgeries, and ER visits in previous 12 months . Vitals . Screenings to include cognitive, depression, and falls . Referrals and appointments  In addition, I have reviewed and discussed with patient certain preventive protocols, quality metrics, and best practice recommendations. A written personalized care plan for preventive services as well as general preventive health recommendations were provided to patient.    Emi Belfast, FNP 11/05/2018

## 2018-11-05 NOTE — Patient Instructions (Signed)
Good to see you today   Health Maintenance, Male A healthy lifestyle and preventive care is important for your health and wellness. Ask your health care provider about what schedule of regular examinations is right for you. What should I know about weight and diet? Eat a Healthy Diet  Eat plenty of vegetables, fruits, whole grains, low-fat dairy products, and lean protein.  Do not eat a lot of foods high in solid fats, added sugars, or salt.  Maintain a Healthy Weight Regular exercise can help you achieve or maintain a healthy weight. You should:  Do at least 150 minutes of exercise each week. The exercise should increase your heart rate and make you sweat (moderate-intensity exercise).  Do strength-training exercises at least twice a week. Watch Your Levels of Cholesterol and Blood Lipids  Have your blood tested for lipids and cholesterol every 5 years starting at 47 years of age. If you are at high risk for heart disease, you should start having your blood tested when you are 47 years old. You may need to have your cholesterol levels checked more often if: ? Your lipid or cholesterol levels are high. ? You are older than 47 years of age. ? You are at high risk for heart disease. What should I know about cancer screening? Many types of cancers can be detected early and may often be prevented. Lung Cancer  You should be screened every year for lung cancer if: ? You are a current smoker who has smoked for at least 30 years. ? You are a former smoker who has quit within the past 15 years.  Talk to your health care provider about your screening options, when you should start screening, and how often you should be screened. Colorectal Cancer  Routine colorectal cancer screening usually begins at 47 years of age and should be repeated every 5-10 years until you are 47 years old. You may need to be screened more often if early forms of precancerous polyps or small growths are found. Your  health care provider may recommend screening at an earlier age if you have risk factors for colon cancer.  Your health care provider may recommend using home test kits to check for hidden blood in the stool.  A small camera at the end of a tube can be used to examine your colon (sigmoidoscopy or colonoscopy). This checks for the earliest forms of colorectal cancer. Prostate and Testicular Cancer  Depending on your age and overall health, your health care provider may do certain tests to screen for prostate and testicular cancer.  Talk to your health care provider about any symptoms or concerns you have about testicular or prostate cancer. Skin Cancer  Check your skin from head to toe regularly.  Tell your health care provider about any new moles or changes in moles, especially if: ? There is a change in a mole's size, shape, or color. ? You have a mole that is larger than a pencil eraser.  Always use sunscreen. Apply sunscreen liberally and repeat throughout the day.  Protect yourself by wearing long sleeves, pants, a wide-brimmed hat, and sunglasses when outside. What should I know about heart disease, diabetes, and high blood pressure?  If you are 82-26 years of age, have your blood pressure checked every 3-5 years. If you are 36 years of age or older, have your blood pressure checked every year. You should have your blood pressure measured twice-once when you are at a hospital or clinic, and once  when you are not at a hospital or clinic. Record the average of the two measurements. To check your blood pressure when you are not at a hospital or clinic, you can use: ? An automated blood pressure machine at a pharmacy. ? A home blood pressure monitor.  Talk to your health care provider about your target blood pressure.  If you are between 41-41 years old, ask your health care provider if you should take aspirin to prevent heart disease.  Have regular diabetes screenings by checking your  fasting blood sugar level. ? If you are at a normal weight and have a low risk for diabetes, have this test once every three years after the age of 103. ? If you are overweight and have a high risk for diabetes, consider being tested at a younger age or more often.  A one-time screening for abdominal aortic aneurysm (AAA) by ultrasound is recommended for men aged 65-75 years who are current or former smokers. What should I know about preventing infection? Hepatitis B If you have a higher risk for hepatitis B, you should be screened for this virus. Talk with your health care provider to find out if you are at risk for hepatitis B infection. Hepatitis C Blood testing is recommended for:  Everyone born from [birth year] through 1964.  Anyone with known risk factors for hepatitis C. Sexually Transmitted Diseases (STDs)  You should be screened each year for STDs including gonorrhea and chlamydia if: ? You are sexually active and are younger than 47 years of age. ? You are older than 47 years of age and your health care provider tells you that you are at risk for this type of infection. ? Your sexual activity has changed since you were last screened and you are at an increased risk for chlamydia or gonorrhea. Ask your health care provider if you are at risk.  Talk with your health care provider about whether you are at high risk of being infected with HIV. Your health care provider may recommend a prescription medicine to help prevent HIV infection. What else can I do?  Schedule regular health, dental, and eye exams.  Stay current with your vaccines (immunizations).  Do not use any tobacco products, such as cigarettes, chewing tobacco, and e-cigarettes. If you need help quitting, ask your health care provider.  Limit alcohol intake to no more than 2 drinks per day. One drink equals 12 ounces of beer, 5 ounces of wine, or 1 ounces of hard liquor.  Do not use street drugs.  Do not share  needles.  Ask your health care provider for help if you need support or information about quitting drugs.  Tell your health care provider if you often feel depressed.  Tell your health care provider if you have ever been abused or do not feel safe at home. This information is not intended to replace advice given to you by your health care provider. Make sure you discuss any questions you have with your health care provider. Document Released: 03/03/2008 Document Revised: 05/04/2016 Document Reviewed: 06/09/2015 Elsevier Interactive Patient Education  2019 [employer].

## 2018-11-07 ENCOUNTER — Other Ambulatory Visit: Payer: Self-pay | Admitting: Family Medicine

## 2018-11-07 DIAGNOSIS — G4733 Obstructive sleep apnea (adult) (pediatric): Secondary | ICD-10-CM | POA: Diagnosis not present

## 2018-11-09 ENCOUNTER — Other Ambulatory Visit: Payer: Self-pay | Admitting: Family Medicine

## 2018-11-09 DIAGNOSIS — M25551 Pain in right hip: Secondary | ICD-10-CM

## 2018-11-09 DIAGNOSIS — G4733 Obstructive sleep apnea (adult) (pediatric): Secondary | ICD-10-CM | POA: Diagnosis not present

## 2018-11-09 NOTE — Telephone Encounter (Signed)
Electronic refill request. Celebrex 200 mg Last office visit:   11/05/2018 Last Filled:    60 capsule 0 10/16/2018  Please advise.

## 2018-11-15 ENCOUNTER — Encounter: Payer: Self-pay | Admitting: Family Medicine

## 2018-11-21 DIAGNOSIS — Z79891 Long term (current) use of opiate analgesic: Secondary | ICD-10-CM | POA: Diagnosis not present

## 2018-11-21 DIAGNOSIS — G894 Chronic pain syndrome: Secondary | ICD-10-CM | POA: Diagnosis not present

## 2018-11-21 DIAGNOSIS — M25559 Pain in unspecified hip: Secondary | ICD-10-CM | POA: Diagnosis not present

## 2018-11-21 DIAGNOSIS — M25569 Pain in unspecified knee: Secondary | ICD-10-CM | POA: Diagnosis not present

## 2018-12-01 ENCOUNTER — Other Ambulatory Visit: Payer: Self-pay | Admitting: Family Medicine

## 2018-12-01 DIAGNOSIS — M1A09X0 Idiopathic chronic gout, multiple sites, without tophus (tophi): Secondary | ICD-10-CM

## 2018-12-03 ENCOUNTER — Other Ambulatory Visit: Payer: Self-pay

## 2018-12-03 NOTE — Telephone Encounter (Signed)
Debbie, am I ok to take this off pt's med list as it looks like you may have switched him to colcrys

## 2018-12-03 NOTE — Telephone Encounter (Signed)
Yes, thanks

## 2018-12-05 ENCOUNTER — Encounter: Payer: Self-pay | Admitting: Family Medicine

## 2018-12-05 NOTE — Progress Notes (Signed)
Subjective:    Patient ID: Michael Payne, male    DOB: 07/23/1971, 48 y.o.   MRN: [id number]  HPI This is a 48 yo male who presents today for follow up of chronic medical conditions.   Joint pain- continues to have right hip, left knee, occasional back pain. He is seeing orthopedics. Hoping that future weight loss surgery will help with his joint pain. Has been going to water aerobics a couple of times a week. Wife had recent surgery so he has been off his routine some.   Morbid obesity- has re-established with [facility] bariatrics. Hoping to be able to meet weight requirements for surgery later this year.   Dry cough- stopped ACE, no improvement. Rarely productive. ? Post nasal drainage.   Past Medical History:  Diagnosis Date  . Anemia    borderline anemia, was instructed to eat more iron filled foods  . GERD (gastroesophageal reflux disease)    lactose intolerance  . HTN (hypertension)    Past Surgical History:  Procedure Laterality Date  . FOOT SURGERY    . KNEE ARTHROSCOPY WITH MEDIAL MENISECTOMY Left 12/16/2014   Procedure: KNEE ARTHROSCOPY WITH MEDIAL MENISECTOMY;  Surgeon: Sheral Apley, MD;  Location: [facility] OR;  Service: Orthopedics;  Laterality: Left;   Family History  Problem Relation Age of Onset  . Heart attack Mother 93  . Lung cancer Father   . Cancer Father   . CAD Maternal Grandfather    Social History   Tobacco Use  . Smoking status: Former Smoker    Packs/day: 2.00    Years: 22.00    Pack years: 44.00    Types: Cigarettes    Last attempt to quit: 02/24/2011    Years since quitting: 7.7  . Smokeless tobacco: Never Used  Substance Use Topics  . Alcohol use: Yes    Comment: Weekends/beer-- has slowed down  alot  . Drug use: Not Currently    Types: Marijuana    Comment: former -  quit in 2012      Review of Systems Per HPI    Objective:   Physical Exam Constitutional:      General: He is not in acute distress.    Appearance: Normal appearance. He  is obese. He is not ill-appearing, toxic-appearing or diaphoretic.  HENT:     Head: Normocephalic and atraumatic.     Mouth/Throat:     Mouth: Mucous membranes are moist.     Pharynx: Oropharynx is clear.  Eyes:     Conjunctiva/sclera: Conjunctivae normal.  Cardiovascular:     Rate and Rhythm: Normal rate and regular rhythm.  Pulmonary:     Effort: Pulmonary effort is normal.     Breath sounds: Normal breath sounds.  Musculoskeletal:     Right lower leg: No edema.     Left lower leg: No edema.  Skin:    General: Skin is warm and dry.  Neurological:     Mental Status: He is alert and oriented to person, place, and time.  Psychiatric:        Mood and Affect: Mood normal.        Behavior: Behavior normal.        Thought Content: Thought content normal.        Judgment: Judgment normal.       BP 126/72   Pulse 99   Temp 98.6 F (37 C) (Oral)   Ht 6\' 4"  (1.93 m)   Wt (!) 507 lb 1.6 oz (  230 kg)   SpO2 93%   BMI 61.73 kg/m  Wt Readings from Last 3 Encounters:  11/05/18 (!) 507 lb 1.6 oz (230 kg)  08/27/18 (!) 513 lb 1.9 oz (232.7 kg)  07/10/18 (!) 513 lb (232.7 kg)       Assessment & Plan:  1. Morbid obesity (HCC) - encouraged continued efforts of weight loss and follow up with [facility]/bariatrics  2. Essential hypertension - well controlled on current medicaitons  3. Arthralgia, unspecified joint - multiple joints with pain, continue water aerobics as much as possible, daily stretching, acetaminophen prn, weigh loss - follow up in 6 months   Olean Ree, FNP-BC  [facility], [facility]  12/05/2018 9:39 AM

## 2018-12-06 DIAGNOSIS — G4733 Obstructive sleep apnea (adult) (pediatric): Secondary | ICD-10-CM | POA: Diagnosis not present

## 2018-12-13 ENCOUNTER — Other Ambulatory Visit: Payer: Self-pay | Admitting: Family Medicine

## 2018-12-13 DIAGNOSIS — M25551 Pain in right hip: Secondary | ICD-10-CM

## 2018-12-14 ENCOUNTER — Other Ambulatory Visit: Payer: Self-pay | Admitting: Family Medicine

## 2018-12-14 DIAGNOSIS — M1A09X0 Idiopathic chronic gout, multiple sites, without tophus (tophi): Secondary | ICD-10-CM

## 2018-12-19 DIAGNOSIS — M25569 Pain in unspecified knee: Secondary | ICD-10-CM | POA: Diagnosis not present

## 2018-12-19 DIAGNOSIS — G894 Chronic pain syndrome: Secondary | ICD-10-CM | POA: Diagnosis not present

## 2018-12-19 DIAGNOSIS — Z79891 Long term (current) use of opiate analgesic: Secondary | ICD-10-CM | POA: Diagnosis not present

## 2018-12-19 DIAGNOSIS — M25559 Pain in unspecified hip: Secondary | ICD-10-CM | POA: Diagnosis not present

## 2019-01-06 DIAGNOSIS — G4733 Obstructive sleep apnea (adult) (pediatric): Secondary | ICD-10-CM | POA: Diagnosis not present

## 2019-01-07 ENCOUNTER — Other Ambulatory Visit: Payer: Self-pay | Admitting: Family Medicine

## 2019-01-07 DIAGNOSIS — M25551 Pain in right hip: Secondary | ICD-10-CM

## 2019-01-16 DIAGNOSIS — Z79891 Long term (current) use of opiate analgesic: Secondary | ICD-10-CM | POA: Diagnosis not present

## 2019-01-16 DIAGNOSIS — M25569 Pain in unspecified knee: Secondary | ICD-10-CM | POA: Diagnosis not present

## 2019-01-16 DIAGNOSIS — G894 Chronic pain syndrome: Secondary | ICD-10-CM | POA: Diagnosis not present

## 2019-01-16 DIAGNOSIS — M25559 Pain in unspecified hip: Secondary | ICD-10-CM | POA: Diagnosis not present

## 2019-02-05 DIAGNOSIS — G4733 Obstructive sleep apnea (adult) (pediatric): Secondary | ICD-10-CM | POA: Diagnosis not present

## 2019-02-08 ENCOUNTER — Other Ambulatory Visit: Payer: Self-pay | Admitting: Family Medicine

## 2019-02-08 DIAGNOSIS — M1A09X0 Idiopathic chronic gout, multiple sites, without tophus (tophi): Secondary | ICD-10-CM

## 2019-02-08 MED ORDER — ALLOPURINOL 300 MG PO TABS: 300.0000 mg | ORAL_TABLET | Freq: Two times a day (BID) | ORAL | 1 refills | Status: DC

## 2019-02-08 NOTE — Telephone Encounter (Signed)
Refill request for Allopurinol came in. Last refilled on 12/14/2018 for 60 tablets with 0 refill. Patient states he takes Colchicine as needed but takes Allopurinol 300 mg 1 tablet twice daily. OK to refill? [facility] would like [facility] to [facility] on [address] instead of [address].

## 2019-02-13 DIAGNOSIS — M25559 Pain in unspecified hip: Secondary | ICD-10-CM | POA: Diagnosis not present

## 2019-02-13 DIAGNOSIS — G894 Chronic pain syndrome: Secondary | ICD-10-CM | POA: Diagnosis not present

## 2019-02-13 DIAGNOSIS — Z79891 Long term (current) use of opiate analgesic: Secondary | ICD-10-CM | POA: Diagnosis not present

## 2019-02-13 DIAGNOSIS — M25569 Pain in unspecified knee: Secondary | ICD-10-CM | POA: Diagnosis not present

## 2019-02-15 DIAGNOSIS — G4733 Obstructive sleep apnea (adult) (pediatric): Secondary | ICD-10-CM | POA: Diagnosis not present

## 2019-03-08 DIAGNOSIS — G4733 Obstructive sleep apnea (adult) (pediatric): Secondary | ICD-10-CM | POA: Diagnosis not present

## 2019-03-11 DIAGNOSIS — G894 Chronic pain syndrome: Secondary | ICD-10-CM | POA: Diagnosis not present

## 2019-03-11 DIAGNOSIS — M25569 Pain in unspecified knee: Secondary | ICD-10-CM | POA: Diagnosis not present

## 2019-03-11 DIAGNOSIS — Z79891 Long term (current) use of opiate analgesic: Secondary | ICD-10-CM | POA: Diagnosis not present

## 2019-03-11 DIAGNOSIS — M25559 Pain in unspecified hip: Secondary | ICD-10-CM | POA: Diagnosis not present

## 2019-03-20 ENCOUNTER — Other Ambulatory Visit: Payer: Self-pay | Admitting: Family Medicine

## 2019-04-07 DIAGNOSIS — G4733 Obstructive sleep apnea (adult) (pediatric): Secondary | ICD-10-CM | POA: Diagnosis not present

## 2019-04-08 DIAGNOSIS — G894 Chronic pain syndrome: Secondary | ICD-10-CM | POA: Diagnosis not present

## 2019-04-08 DIAGNOSIS — Z79891 Long term (current) use of opiate analgesic: Secondary | ICD-10-CM | POA: Diagnosis not present

## 2019-04-08 DIAGNOSIS — M25569 Pain in unspecified knee: Secondary | ICD-10-CM | POA: Diagnosis not present

## 2019-04-08 DIAGNOSIS — M25559 Pain in unspecified hip: Secondary | ICD-10-CM | POA: Diagnosis not present

## 2019-05-05 ENCOUNTER — Other Ambulatory Visit: Payer: Self-pay | Admitting: Family Medicine

## 2019-05-05 DIAGNOSIS — M25551 Pain in right hip: Secondary | ICD-10-CM

## 2019-05-06 ENCOUNTER — Encounter: Payer: Self-pay | Admitting: Family Medicine

## 2019-05-06 ENCOUNTER — Other Ambulatory Visit: Payer: Self-pay

## 2019-05-06 ENCOUNTER — Ambulatory Visit (INDEPENDENT_AMBULATORY_CARE_PROVIDER_SITE_OTHER): Payer: Medicare HMO | Admitting: Family Medicine

## 2019-05-06 VITALS — BP 142/96 | HR 105 | Temp 98.2°F | Ht 76.0 in | Wt >= 6400 oz

## 2019-05-06 DIAGNOSIS — K429 Umbilical hernia without obstruction or gangrene: Secondary | ICD-10-CM

## 2019-05-06 DIAGNOSIS — M25559 Pain in unspecified hip: Secondary | ICD-10-CM | POA: Diagnosis not present

## 2019-05-06 DIAGNOSIS — Z79891 Long term (current) use of opiate analgesic: Secondary | ICD-10-CM | POA: Diagnosis not present

## 2019-05-06 DIAGNOSIS — G894 Chronic pain syndrome: Secondary | ICD-10-CM | POA: Diagnosis not present

## 2019-05-06 DIAGNOSIS — E8881 Metabolic syndrome: Secondary | ICD-10-CM | POA: Diagnosis not present

## 2019-05-06 DIAGNOSIS — M25569 Pain in unspecified knee: Secondary | ICD-10-CM | POA: Diagnosis not present

## 2019-05-06 LAB — POCT GLYCOSYLATED HEMOGLOBIN (HGB A1C): Hemoglobin A1C: 5.6 % (ref 4.0–5.6)

## 2019-05-06 NOTE — Progress Notes (Signed)
Subjective:    Patient ID: Michael Payne, male    DOB: 1971-06-30, 48 y.o.   MRN: [id number]  HPI This is a 48 yo male who presents today for routine follow up of chronic medical conditions.  Chronic pain- worse with gym being closed, has recently resumed swimming. Having to use cane again.   Morbid obesity- no longer being seen at [facility]. Would like referral to [facility]. Per patient, they stopped follow up due to [facility] virus. Per notes, patient was non compliant, missed appointments and did not follow through with drug screen in allotted amount of time. The patient reports that he was recently very non compliant with eating plan and that along with inability to exercise resulted in significant weight gain. He and his wife have resumed healthy eating and stopped buying junk food and soda and has lost some weight. He is interested in pursing weight loss surgery.   Hernia- he has abdominal hernia that is getting bigger. This was supposed to be repaired at same time as bariatric surgery.   Chronic hip and knee pain- seeing pain management and had to have increase in frequency of pain medication. He reports that this "takes the edge off," Some improvement with increased ability to exercise in the pool.   ROS- denies chest pain, SOB, no recent gout flares, occasional LE edema.    Past Medical History:  Diagnosis Date  . Anemia    borderline anemia, was instructed to eat more iron filled foods  . GERD (gastroesophageal reflux disease)    lactose intolerance  . HTN (hypertension)    Past Surgical History:  Procedure Laterality Date  . FOOT SURGERY    . KNEE ARTHROSCOPY WITH MEDIAL MENISECTOMY Left 12/16/2014   Procedure: KNEE ARTHROSCOPY WITH MEDIAL MENISECTOMY;  Surgeon: Sheral Apley, MD;  Location: [facility] OR;  Service: Orthopedics;  Laterality: Left;   Family History  Problem Relation Age of Onset  . Heart attack Mother 77  . Lung cancer Father   . Cancer Father   . CAD  Maternal Grandfather    Social History   Tobacco Use  . Smoking status: Former Smoker    Packs/day: 2.00    Years: 22.00    Pack years: 44.00    Types: Cigarettes    Quit date: 02/24/2011    Years since quitting: 8.2  . Smokeless tobacco: Never Used  Substance Use Topics  . Alcohol use: Yes    Comment: Weekends/beer-- has slowed down  alot  . Drug use: Not Currently    Types: Marijuana    Comment: former -  quit in 2012      Review of Systems Per HPI    Objective:   Physical Exam Vitals signs reviewed.  Constitutional:      General: He is not in acute distress.    Appearance: Normal appearance. He is obese. He is not ill-appearing, toxic-appearing or diaphoretic.  HENT:     Head: Normocephalic and atraumatic.  Cardiovascular:     Rate and Rhythm: Normal rate and regular rhythm.     Heart sounds: Normal heart sounds.     Comments: HR on auscultation 88 Pulmonary:     Effort: Pulmonary effort is normal.     Breath sounds: Normal breath sounds.  Abdominal:     General: There is no distension.     Palpations: Abdomen is soft.     Tenderness: There is no abdominal tenderness. There is no guarding.     Hernia:  A hernia (umbilical, soft, reducible. ) is present.  Musculoskeletal:     Right lower leg: No edema.     Left lower leg: No edema.     Comments: Ataxic gate.   Neurological:     Mental Status: He is alert and oriented to person, place, and time.  Psychiatric:        Mood and Affect: Mood normal.        Behavior: Behavior normal.        Thought Content: Thought content normal.        Judgment: Judgment normal.       BP (!) 152/104   Pulse (!) 105   Temp 98.2 F (36.8 C) (Temporal)   Ht 6\' 4"  (1.93 m)   Wt (!) 518 lb 12 oz (235.3 kg)   SpO2 95%   BMI 63.14 kg/m  Wt Readings from Last 3 Encounters:  05/06/19 (!) 518 lb 12 oz (235.3 kg)  11/05/18 (!) 507 lb 1.6 oz (230 kg)  08/27/18 (!) 513 lb 1.9 oz (232.7 kg)   BP Readings from Last 3  Encounters:  05/06/19 (!) 152/104  11/05/18 126/72  08/27/18 (!) 147/107  BP recheck-  BP: (!) 142/96        Assessment & Plan:  1. Metabolic syndrome - discussed referral to [facility] for consideration of bariatric surgery and hernia repair - patient provided information regarding online information that needs to be completed - Amb Referral to Bariatric Surgery - POCT glycosylated hemoglobin (Hb A1C) - encouraged him to eat balanced diet, including adequate protein (25-30 grams) and fiber at each meal (3-4 times a day).   2. Morbid obesity ([place]) - Amb Referral to Bariatric Surgery - POCT glycosylated hemoglobin (Hb V3X)  3. Umbilical hernia without obstruction and without gangrene - Reviewed warning signs- unable to reduce, pain - Ambulatory referral to General Surgery  - follow up in 6 months for CPE Clarene Reamer, FNP-BC  [facility], [place]  05/07/2019 6:53 AM

## 2019-05-06 NOTE — Patient Instructions (Addendum)
Bariatric Surgery Information Do one of the following- Attend an in person seminar by calling [phone] to find out schedule Go online for seminar at [email or web address] [facility] will send you a packet)   Follow up in 6 months for complete physical and labs

## 2019-05-06 NOTE — Telephone Encounter (Signed)
Electronic refill request. Celebrex Last office visit:   05/06/2019 Last Filled:    60 capsule 3 01/07/2019  Please advise.

## 2019-05-07 ENCOUNTER — Encounter: Payer: Self-pay | Admitting: Family Medicine

## 2019-05-13 ENCOUNTER — Telehealth: Payer: Self-pay | Admitting: *Deleted

## 2019-05-13 NOTE — Telephone Encounter (Signed)
Please call patient's pharmacy and get PA number and follow up with patient to let him know that it was submitted. Patient with long standing, chronic gout.

## 2019-05-13 NOTE — Telephone Encounter (Signed)
Marketing executive with [facility] left a voicemail that patient was given Colchicine 0.6 mg previously for gout. Cruty stated that a 30 day supply cost patient $141 and he can not afford that. Cruty is requesting that a PA be done to advise why the patient needs this medicaiton or a generic sent in. Please contact patient to let him know what is being done or call Cruty back if you have any questions.

## 2019-05-16 DIAGNOSIS — G4733 Obstructive sleep apnea (adult) (pediatric): Secondary | ICD-10-CM | POA: Diagnosis not present

## 2019-05-16 NOTE — Telephone Encounter (Signed)
[id number] [address], [place] [phone] to initiate a PA for Colchicine 0.6 mg. Pt has been on this long term for chronic gout (Dx M10.9).  First prescribed 11/08/2018 #30 x 5 refills. Pt tolerates well and has controlled symptoms.  Medication is $294/mo (30 day supply)  Medication is covered by insurance it is just a higher copay - no alternatives listed since medication is "covered"  LM for Cruty, RN to return call for next steps/options for getting lower copay or alternative medication.

## 2019-05-23 NOTE — Telephone Encounter (Signed)
Best number [phone] Cherie Ouch returned your call

## 2019-05-24 NOTE — Telephone Encounter (Signed)
Called [facility], left message to return call

## 2019-05-29 ENCOUNTER — Other Ambulatory Visit: Payer: Self-pay | Admitting: Family Medicine

## 2019-05-31 NOTE — Telephone Encounter (Signed)
Left detailed message on Krutis confidential voicemail number about this and relayed information from Asht[place] listed below.

## 2019-06-03 DIAGNOSIS — M25559 Pain in unspecified hip: Secondary | ICD-10-CM | POA: Diagnosis not present

## 2019-06-03 DIAGNOSIS — M25569 Pain in unspecified knee: Secondary | ICD-10-CM | POA: Diagnosis not present

## 2019-06-03 DIAGNOSIS — G894 Chronic pain syndrome: Secondary | ICD-10-CM | POA: Diagnosis not present

## 2019-06-03 DIAGNOSIS — Z79891 Long term (current) use of opiate analgesic: Secondary | ICD-10-CM | POA: Diagnosis not present

## 2019-06-07 NOTE — Telephone Encounter (Signed)
Called and LM another message for [facility] Will close this encounter. If anything further needed a new TE can be created when she calls back

## 2019-07-01 ENCOUNTER — Other Ambulatory Visit: Payer: Self-pay | Admitting: Family Medicine

## 2019-07-01 DIAGNOSIS — G894 Chronic pain syndrome: Secondary | ICD-10-CM | POA: Diagnosis not present

## 2019-07-01 DIAGNOSIS — Z79891 Long term (current) use of opiate analgesic: Secondary | ICD-10-CM | POA: Diagnosis not present

## 2019-07-01 DIAGNOSIS — M25559 Pain in unspecified hip: Secondary | ICD-10-CM | POA: Diagnosis not present

## 2019-07-01 DIAGNOSIS — M25569 Pain in unspecified knee: Secondary | ICD-10-CM | POA: Diagnosis not present

## 2019-07-19 ENCOUNTER — Other Ambulatory Visit: Payer: Self-pay

## 2019-07-19 DIAGNOSIS — M25551 Pain in right hip: Secondary | ICD-10-CM

## 2019-07-19 NOTE — Telephone Encounter (Signed)
Left message for patient to call back. Receiving refill requests for patient's medications from [facility]. [facility] and want to verify that patient does want this switched.

## 2019-07-23 NOTE — Telephone Encounter (Signed)
Spoke with patient and he does want to use [facility]. Sending refill requests to PCP

## 2019-07-24 MED ORDER — CELECOXIB 200 MG PO CAPS: ORAL_CAPSULE | ORAL | 0 refills | Status: DC

## 2019-07-24 MED ORDER — ALLOPURINOL 300 MG PO TABS: 300.0000 mg | ORAL_TABLET | Freq: Two times a day (BID) | ORAL | 3 refills | Status: DC

## 2019-07-24 MED ORDER — AMLODIPINE BESYLATE 10 MG PO TABS: 10.0000 mg | ORAL_TABLET | Freq: Every day | ORAL | 3 refills | Status: DC

## 2019-07-29 DIAGNOSIS — M25569 Pain in unspecified knee: Secondary | ICD-10-CM | POA: Diagnosis not present

## 2019-07-29 DIAGNOSIS — M25559 Pain in unspecified hip: Secondary | ICD-10-CM | POA: Diagnosis not present

## 2019-07-29 DIAGNOSIS — G894 Chronic pain syndrome: Secondary | ICD-10-CM | POA: Diagnosis not present

## 2019-07-29 DIAGNOSIS — Z79891 Long term (current) use of opiate analgesic: Secondary | ICD-10-CM | POA: Diagnosis not present

## 2019-08-21 DIAGNOSIS — G4733 Obstructive sleep apnea (adult) (pediatric): Secondary | ICD-10-CM | POA: Diagnosis not present

## 2019-08-26 DIAGNOSIS — M25569 Pain in unspecified knee: Secondary | ICD-10-CM | POA: Diagnosis not present

## 2019-08-26 DIAGNOSIS — M069 Rheumatoid arthritis, unspecified: Secondary | ICD-10-CM | POA: Diagnosis not present

## 2019-08-26 DIAGNOSIS — Z79891 Long term (current) use of opiate analgesic: Secondary | ICD-10-CM | POA: Diagnosis not present

## 2019-08-26 DIAGNOSIS — G894 Chronic pain syndrome: Secondary | ICD-10-CM | POA: Diagnosis not present

## 2019-08-26 DIAGNOSIS — M25559 Pain in unspecified hip: Secondary | ICD-10-CM | POA: Diagnosis not present

## 2019-09-15 ENCOUNTER — Other Ambulatory Visit: Payer: Self-pay | Admitting: Family Medicine

## 2019-09-15 DIAGNOSIS — M25551 Pain in right hip: Secondary | ICD-10-CM

## 2019-09-25 DIAGNOSIS — G894 Chronic pain syndrome: Secondary | ICD-10-CM | POA: Diagnosis not present

## 2019-09-25 DIAGNOSIS — M25559 Pain in unspecified hip: Secondary | ICD-10-CM | POA: Diagnosis not present

## 2019-09-25 DIAGNOSIS — Z79891 Long term (current) use of opiate analgesic: Secondary | ICD-10-CM | POA: Diagnosis not present

## 2019-09-25 DIAGNOSIS — M25569 Pain in unspecified knee: Secondary | ICD-10-CM | POA: Diagnosis not present

## 2019-09-30 DIAGNOSIS — M25511 Pain in right shoulder: Secondary | ICD-10-CM | POA: Diagnosis not present

## 2019-09-30 DIAGNOSIS — M25512 Pain in left shoulder: Secondary | ICD-10-CM | POA: Diagnosis not present

## 2019-09-30 DIAGNOSIS — M79604 Pain in right leg: Secondary | ICD-10-CM | POA: Diagnosis not present

## 2019-09-30 DIAGNOSIS — M79605 Pain in left leg: Secondary | ICD-10-CM | POA: Diagnosis not present

## 2019-10-03 DIAGNOSIS — M79604 Pain in right leg: Secondary | ICD-10-CM | POA: Diagnosis not present

## 2019-10-03 DIAGNOSIS — M25512 Pain in left shoulder: Secondary | ICD-10-CM | POA: Diagnosis not present

## 2019-10-03 DIAGNOSIS — M79605 Pain in left leg: Secondary | ICD-10-CM | POA: Diagnosis not present

## 2019-10-03 DIAGNOSIS — M25511 Pain in right shoulder: Secondary | ICD-10-CM | POA: Diagnosis not present

## 2019-10-07 DIAGNOSIS — M25512 Pain in left shoulder: Secondary | ICD-10-CM | POA: Diagnosis not present

## 2019-10-07 DIAGNOSIS — M25511 Pain in right shoulder: Secondary | ICD-10-CM | POA: Diagnosis not present

## 2019-10-07 DIAGNOSIS — M79605 Pain in left leg: Secondary | ICD-10-CM | POA: Diagnosis not present

## 2019-10-07 DIAGNOSIS — M79604 Pain in right leg: Secondary | ICD-10-CM | POA: Diagnosis not present

## 2019-10-10 ENCOUNTER — Other Ambulatory Visit: Payer: Self-pay

## 2019-10-10 ENCOUNTER — Ambulatory Visit (INDEPENDENT_AMBULATORY_CARE_PROVIDER_SITE_OTHER): Payer: Medicare HMO | Admitting: Internal Medicine

## 2019-10-10 ENCOUNTER — Encounter: Payer: Self-pay | Admitting: Internal Medicine

## 2019-10-10 VITALS — BP 148/100 | HR 99 | Temp 97.8°F | Wt >= 6400 oz

## 2019-10-10 DIAGNOSIS — M25561 Pain in right knee: Secondary | ICD-10-CM

## 2019-10-10 DIAGNOSIS — M79604 Pain in right leg: Secondary | ICD-10-CM | POA: Diagnosis not present

## 2019-10-10 DIAGNOSIS — G8929 Other chronic pain: Secondary | ICD-10-CM

## 2019-10-10 DIAGNOSIS — M25551 Pain in right hip: Secondary | ICD-10-CM | POA: Diagnosis not present

## 2019-10-10 DIAGNOSIS — M25512 Pain in left shoulder: Secondary | ICD-10-CM | POA: Diagnosis not present

## 2019-10-10 DIAGNOSIS — M25511 Pain in right shoulder: Secondary | ICD-10-CM

## 2019-10-10 DIAGNOSIS — M79605 Pain in left leg: Secondary | ICD-10-CM | POA: Diagnosis not present

## 2019-10-10 NOTE — Progress Notes (Signed)
Subjective:    Patient ID: Michael Payne, male    DOB: [birth year], 49 y.o.   MRN: [id number]  HPI  Pt presents to the clinic today with c/o right shoulder, right hip and right knee pain. This has been an ongoing issue since a fall at the [facility] in October. He describes the pain as achy and throbbing with intermittent sharp pains. He reports the shoulder pain radiates into his right hand. He denies numbness, tingling or weakness in his right upper or lower extremity. He has seen pain management that has him on Celebrex and Oxycodone which has not provided any relief. He has been referred to PT, and has gone to sessions already. He would like xrays as no one has gotten xrays at this point.   Review of Systems  Past Medical History:  Diagnosis Date  . Anemia    borderline anemia, was instructed to eat more iron filled foods  . GERD (gastroesophageal reflux disease)    lactose intolerance  . HTN (hypertension)     Current Outpatient Medications  Medication Sig Dispense Refill  . Acetaminophen 500 MG coapsule     . allopurinol (ZYLOPRIM) 300 MG tablet Take 1 tablet (300 mg total) by mouth 2 (two) times daily. 180 tablet 3  . AMITIZA 24 MCG capsule TAKE 1 CAPSULE BY MOUTH TWICE A DAY WITH FOOD AND WATER  5  . amLODipine (NORVASC) 10 MG tablet Take 1 tablet (10 mg total) by mouth daily. 90 tablet 3  . benzocaine-menthol (CHLORAEPTIC) 6-10 MG lozenge     . Calcium-Vitamin D-Vitamin K (CHEWABLE CALCIUM PO)     . CALCIUM/VITAMIN D3/ADULT GUMMY 250-100-500 MG-MG-UNIT CHEW     . celecoxib (CELEBREX) 200 MG capsule TAKE 1 CAPSULE BY MOUTH 2 TIMES DAILY AS NEEDED FOR MODERATE PAIN. 180 capsule 0  . COLCRYS 0.6 MG tablet TAKE 1 TABLET BY MOUTH DAILY AS NEEDED 30 tablet 5  . Melatonin 5 MG TABS Take by mouth.    . Melatonin 5 MG TABS     . tiZANidine (ZANAFLEX) 2 MG tablet Take 2 mg by mouth 3 (three) times daily as needed.  1  . triamterene-hydrochlorothiazide (MAXZIDE-25) 37.5-25 MG tablet Take 1  tablet by mouth daily.  4   No current facility-administered medications for this visit.    Allergies  Allergen Reactions  . Aspirin Other (See Comments)    Upset stomach    Family History  Problem Relation Age of Onset  . Heart attack Mother 29  . Lung cancer Father   . Cancer Father   . CAD Maternal Grandfather     Social History   Socioeconomic History  . Marital status: Married    Spouse name: Not on file  . Number of children: 2  . Years of education: Not on file  . Highest education level: Not on file  Occupational History  . Occupation: disabled  Tobacco Use  . Smoking status: Former Smoker    Packs/day: 2.00    Years: 22.00    Pack years: 44.00    Types: Cigarettes    Quit date: 02/24/2011    Years since quitting: 8.6  . Smokeless tobacco: Never Used  Substance and Sexual Activity  . Alcohol use: Yes    Comment: Weekends/beer-- has slowed down  alot  . Drug use: Not Currently    Types: Marijuana    Comment: former -  quit in 2012  . Sexual activity: Yes    Partners: Female  Other  Topics Concern  . Not on file  Social History Narrative  . Not on file   Social Determinants of Health   Financial Resource Strain:   . Difficulty of Paying Living Expenses: Not on file  Food Insecurity:   . Worried About Programme researcher, broadcasting/film/video in the Last Year: Not on file  . Ran Out of Food in the Last Year: Not on file  Transportation Needs:   . Lack of Transportation (Medical): Not on file  . Lack of Transportation (Non-Medical): Not on file  Physical Activity:   . Days of Exercise per Week: Not on file  . Minutes of Exercise per Session: Not on file  Stress:   . Feeling of Stress : Not on file  Social Connections:   . Frequency of Communication with Friends and Family: Not on file  . Frequency of Social Gatherings with Friends and Family: Not on file  . Attends Religious Services: Not on file  . Active Member of Clubs or Organizations: Not on file  . Attends Occupational hygienist Meetings: Not on file  . Marital Status: Not on file  Intimate Partner Violence:   . Fear of Current or Ex-Partner: Not on file  . Emotionally Abused: Not on file  . Physically Abused: Not on file  . Sexually Abused: Not on file     Constitutional: Denies fever, malaise, fatigue, headache or abrupt weight changes.  Respiratory: Denies difficulty breathing, shortness of breath, cough or sputum production.   Cardiovascular: Denies chest pain, chest tightness, palpitations or swelling in the hands or feet.  Musculoskeletal: Pt reports right shoulder, right hip and right knee pain. Denies decrease in range of motion, difficulty with gait, muscle pain or joint swelling.  Neurological: Denies numbness, tingling, weakness or problems with balance and coordination.    No other specific complaints in a complete review of systems (except as listed in HPI above).     Objective:   Physical Exam   BP (!) 148/100   Pulse 99   Temp 97.8 F (36.6 C) (Temporal)   Wt (!) 508 lb (230.4 kg)   SpO2 96%   BMI 61.84 kg/m   Wt Readings from Last 3 Encounters:  05/06/19 (!) 506 lb (229.5 kg)  11/05/18 (!) 507 lb 1.6 oz (230 kg)  08/27/18 (!) 513 lb 1.9 oz (232.7 kg)    General: Appears his stated age, obese, in NAD. Cardiovascular: Normal rate and rhythm.  Pulmonary/Chest: Normal effort and positive vesicular breath sounds. No respiratory distress. No wheezes, rales or ronchi noted.  Musculoskeletal: Normal flexion, extension and rotation of the cervical spine. No bony tenderness noted over the spine but pain with palpation of the right paracervical muscles. Normal abduction, adduction, internal and external rotation of the right shoulder shoulder. No pain with palpation of the shoulder. Strength 5/5 BUE, hand grips equal. Normal abduction, adduction and internal rotation of the right hip. No pain with palpation of the right hip. Normal flexion and extension of the right knee.  Crepitus noted with ROM of the right knee. No joint swelling noted. Using cane for assistance with gait. Neurological: Alert and oriented. Coordination normal.    BMET    Component Value Date/Time   NA 140 10/29/2018 1249   K 4.9 10/29/2018 1249   CL 102 10/29/2018 1249   CO2 32 10/29/2018 1249   GLUCOSE 89 10/29/2018 1249   BUN 18 10/29/2018 1249   CREATININE 1.02 10/29/2018 1249   CREATININE 0.79 05/27/2015 1246  CALCIUM 10.1 10/29/2018 1249   GFRNONAA >60 07/24/2015 1532   GFRAA >60 07/24/2015 1532    Lipid Panel     Component Value Date/Time   CHOL 139 10/29/2018 1249   TRIG 225.0 (H) 10/29/2018 1249   HDL 35.60 (L) 10/29/2018 1249   CHOLHDL 4 10/29/2018 1249   VLDL 45.0 (H) 10/29/2018 1249   LDLCALC 87 02/18/2015 1106    CBC    Component Value Date/Time   WBC 6.0 07/24/2015 1532   RBC 4.48 07/24/2015 1532   HGB 13.5 07/24/2015 1532   HCT 43.0 07/24/2015 1532   PLT 218 07/24/2015 1532   MCV 96.0 07/24/2015 1532   MCV 99.1 (A) 09/23/2013 1116   MCH 30.1 07/24/2015 1532   MCHC 31.4 07/24/2015 1532   RDW 13.6 07/24/2015 1532   LYMPHSABS 2.3 07/11/2012 0858   MONOABS 0.6 07/11/2012 0858   EOSABS 0.3 07/11/2012 0858   BASOSABS 0.0 07/11/2012 0858    Hgb A1C Lab Results  Component Value Date   HGBA1C 5.6 05/06/2019           Assessment & Plan:   Chronic Right Shoulder, Right Hip and Right Knee Pain, s/p Fall:  Will obtain xray of right shoulder, right hip and right knee Encouraged stretching Continue to work with PT Continue Celebrex and Oxycodone per pain management  Will follow up after xrays, return precautions discussed  Nicki Reaper, NP This visit occurred during the SARS-CoV-2 public health emergency.  Safety protocols were in place, including screening questions prior to the visit, additional usage of staff PPE, and extensive cleaning of exam room while observing appropriate contact time as indicated for disinfecting solutions.

## 2019-10-13 ENCOUNTER — Encounter: Payer: Self-pay | Admitting: Internal Medicine

## 2019-10-13 NOTE — Patient Instructions (Signed)
Hip Exercises Ask your health care provider which exercises are safe for you. Do exercises exactly as told by your health care provider and adjust them as directed. It is normal to feel mild stretching, pulling, tightness, or discomfort as you do these exercises. Stop right away if you feel sudden pain or your pain gets worse. Do not begin these exercises until told by your health care provider. Stretching and range-of-motion exercises These exercises warm up your muscles and joints and improve the movement and flexibility of your hip. These exercises also help to relieve pain, numbness, and tingling. You may be asked to limit your range of motion if you had a hip replacement. Talk to your health care provider about these restrictions. Hamstrings, supine  1. Lie on your back (supine position). 2. Loop a belt or towel over the ball of your left / right foot. The ball of your foot is on the walking surface, right under your toes. 3. Straighten your left / right knee and slowly pull on the belt or towel to raise your leg until you feel a gentle stretch behind your knee (hamstring). ? Do not let your knee bend while you do this. ? Keep your other leg flat on the floor. 4. Hold this position for __________ seconds. 5. Slowly return your leg to the starting position. Repeat __________ times. Complete this exercise __________ times a day. Hip rotation  1. Lie on your back on a firm surface. 2. With your left / right hand, gently pull your left / right knee toward the shoulder that is on the same side of the body. Stop when your knee is pointing toward the ceiling. 3. Hold your left / right ankle with your other hand. 4. Keeping your knee steady, gently pull your left / right ankle toward your other shoulder until you feel a stretch in your buttocks. ? Keep your hips and shoulders firmly planted while you do this stretch. 5. Hold this position for __________ seconds. Repeat __________ times. Complete  this exercise __________ times a day. Seated stretch This exercise is sometimes called hamstrings and adductors stretch. 1. Sit on the floor with your legs stretched wide. Keep your knees straight during this exercise. 2. Keeping your head and back in a straight line, bend at your waist to reach for your left foot (position A). You should feel a stretch in your right inner thigh (adductors). 3. Hold this position for __________ seconds. Then slowly return to the upright position. 4. Keeping your head and back in a straight line, bend at your waist to reach forward (position B). You should feel a stretch behind both of your thighs and knees (hamstrings). 5. Hold this position for __________ seconds. Then slowly return to the upright position. 6. Keeping your head and back in a straight line, bend at your waist to reach for your right foot (position C). You should feel a stretch in your left inner thigh (adductors). 7. Hold this position for __________ seconds. Then slowly return to the upright position. Repeat __________ times. Complete this exercise __________ times a day. Lunge This exercise stretches the muscles of the hip (hip flexors). 1. Place your left / right knee on the floor and bend your other knee so that is directly over your ankle. You should be half-kneeling. 2. Keep good posture with your head over your shoulders. 3. Tighten your buttocks to point your tailbone downward. This will prevent your back from arching too much. 4. You should feel a   gentle stretch in the front of your left / right thigh and hip. If you do not feel a stretch, slide your other foot forward slightly and then slowly lunge forward with your chest up until your knee once again lines up over your ankle. ? Make sure your tailbone continues to point downward. 5. Hold this position for __________ seconds. 6. Slowly return to the starting position. Repeat __________ times. Complete this exercise __________ times a  day. Strengthening exercises These exercises build strength and endurance in your hip. Endurance is the ability to use your muscles for a long time, even after they get tired. Bridge This exercise strengthens the muscles of your hip (hip extensors). 1. Lie on your back on a firm surface with your knees bent and your feet flat on the floor. 2. Tighten your buttocks muscles and lift your bottom off the floor until the trunk of your body and your hips are level with your thighs. ? Do not arch your back. ? You should feel the muscles working in your buttocks and the back of your thighs. If you do not feel these muscles, slide your feet 1-2 inches (2.5-5 cm) farther away from your buttocks. 3. Hold this position for __________ seconds. 4. Slowly lower your hips to the starting position. 5. Let your muscles relax completely between repetitions. Repeat __________ times. Complete this exercise __________ times a day. Straight leg raises, side-lying This exercise strengthens the muscles that move the hip joint away from the center of the body (hip abductors). 1. Lie on your side with your left / right leg in the top position. Lie so your head, shoulder, hip, and knee line up. You may bend your bottom knee slightly to help you balance. 2. Roll your hips slightly forward, so your hips are stacked directly over each other and your left / right knee is facing forward. 3. Leading with your heel, lift your top leg 4-6 inches (10-15 cm). You should feel the muscles in your top hip lifting. ? Do not let your foot drift forward. ? Do not let your knee roll toward the ceiling. 4. Hold this position for __________ seconds. 5. Slowly return to the starting position. 6. Let your muscles relax completely between repetitions. Repeat __________ times. Complete this exercise __________ times a day. Straight leg raises, side-lying This exercise strengthens the muscles that move the hip joint toward the center of the  body (hip adductors). 1. Lie on your side with your left / right leg in the bottom position. Lie so your head, shoulder, hip, and knee line up. You may place your upper foot in front to help you balance. 2. Roll your hips slightly forward, so your hips are stacked directly over each other and your left / right knee is facing forward. 3. Tense the muscles in your inner thigh and lift your bottom leg 4-6 inches (10-15 cm). 4. Hold this position for __________ seconds. 5. Slowly return to the starting position. 6. Let your muscles relax completely between repetitions. Repeat __________ times. Complete this exercise __________ times a day. Straight leg raises, supine This exercise strengthens the muscles in the front of your thigh (quadriceps). 1. Lie on your back (supine position) with your left / right leg extended and your other knee bent. 2. Tense the muscles in the front of your left / right thigh. You should see your kneecap slide up or see increased dimpling just above your knee. 3. Keep these muscles tight as you raise your   leg 4-6 inches (10-15 cm) off the floor. Do not let your knee bend. 4. Hold this position for __________ seconds. 5. Keep these muscles tense as you lower your leg. 6. Relax the muscles slowly and completely between repetitions. Repeat __________ times. Complete this exercise __________ times a day. Hip abductors, standing This exercise strengthens the muscles that move the leg and hip joint away from the center of the body (hip abductors). 1. Tie one end of a rubber exercise band or tubing to a secure surface, such as a chair, table, or pole. 2. Loop the other end of the band or tubing around your left / right ankle. 3. Keeping your ankle with the band or tubing directly opposite the secured end, step away until there is tension in the tubing or band. Hold on to a chair, table, or pole as needed for balance. 4. Lift your left / right leg out to your side. While you do  this: ? Keep your back upright. ? Keep your shoulders over your hips. ? Keep your toes pointing forward. ? Make sure to use your hip muscles to slowly lift your leg. Do not tip your body or forcefully lift your leg. 5. Hold this position for __________ seconds. 6. Slowly return to the starting position. Repeat __________ times. Complete this exercise __________ times a day. Squats This exercise strengthens the muscles in the front of your thigh (quadriceps). 1. Stand in a door frame so your feet and knees are in line with the frame. You may place your hands on the frame for balance. 2. Slowly bend your knees and lower your hips like you are going to sit in a chair. ? Keep your lower legs in a straight-up-and-down position. ? Do not let your hips go lower than your knees. ? Do not bend your knees lower than told by your health care provider. ? If your hip pain increases, do not bend as low. 3. Hold this position for ___________ seconds. 4. Slowly push with your legs to return to standing. Do not use your hands to pull yourself to standing. Repeat __________ times. Complete this exercise __________ times a day. This information is not intended to replace advice given to you by your health care provider. Make sure you discuss any questions you have with your health care provider. Document Revised: 04/11/2019 Document Reviewed: 07/17/2018 Elsevier Patient Education  2020 [employer]  

## 2019-10-14 DIAGNOSIS — M25511 Pain in right shoulder: Secondary | ICD-10-CM | POA: Diagnosis not present

## 2019-10-14 DIAGNOSIS — M25512 Pain in left shoulder: Secondary | ICD-10-CM | POA: Diagnosis not present

## 2019-10-14 DIAGNOSIS — M79605 Pain in left leg: Secondary | ICD-10-CM | POA: Diagnosis not present

## 2019-10-14 DIAGNOSIS — M79604 Pain in right leg: Secondary | ICD-10-CM | POA: Diagnosis not present

## 2019-10-16 DIAGNOSIS — M79604 Pain in right leg: Secondary | ICD-10-CM | POA: Diagnosis not present

## 2019-10-16 DIAGNOSIS — M25511 Pain in right shoulder: Secondary | ICD-10-CM | POA: Diagnosis not present

## 2019-10-16 DIAGNOSIS — M25512 Pain in left shoulder: Secondary | ICD-10-CM | POA: Diagnosis not present

## 2019-10-16 DIAGNOSIS — M79605 Pain in left leg: Secondary | ICD-10-CM | POA: Diagnosis not present

## 2019-10-21 DIAGNOSIS — M25511 Pain in right shoulder: Secondary | ICD-10-CM | POA: Diagnosis not present

## 2019-10-21 DIAGNOSIS — M79604 Pain in right leg: Secondary | ICD-10-CM | POA: Diagnosis not present

## 2019-10-21 DIAGNOSIS — M25512 Pain in left shoulder: Secondary | ICD-10-CM | POA: Diagnosis not present

## 2019-10-21 DIAGNOSIS — M79605 Pain in left leg: Secondary | ICD-10-CM | POA: Diagnosis not present

## 2019-10-25 DIAGNOSIS — M25559 Pain in unspecified hip: Secondary | ICD-10-CM | POA: Diagnosis not present

## 2019-10-25 DIAGNOSIS — G894 Chronic pain syndrome: Secondary | ICD-10-CM | POA: Diagnosis not present

## 2019-10-25 DIAGNOSIS — M25569 Pain in unspecified knee: Secondary | ICD-10-CM | POA: Diagnosis not present

## 2019-10-25 DIAGNOSIS — Z79891 Long term (current) use of opiate analgesic: Secondary | ICD-10-CM | POA: Diagnosis not present

## 2019-10-28 DIAGNOSIS — M25512 Pain in left shoulder: Secondary | ICD-10-CM | POA: Diagnosis not present

## 2019-10-28 DIAGNOSIS — M79605 Pain in left leg: Secondary | ICD-10-CM | POA: Diagnosis not present

## 2019-10-28 DIAGNOSIS — M79604 Pain in right leg: Secondary | ICD-10-CM | POA: Diagnosis not present

## 2019-10-28 DIAGNOSIS — M25511 Pain in right shoulder: Secondary | ICD-10-CM | POA: Diagnosis not present

## 2019-10-31 DIAGNOSIS — M79605 Pain in left leg: Secondary | ICD-10-CM | POA: Diagnosis not present

## 2019-10-31 DIAGNOSIS — M25512 Pain in left shoulder: Secondary | ICD-10-CM | POA: Diagnosis not present

## 2019-10-31 DIAGNOSIS — M25511 Pain in right shoulder: Secondary | ICD-10-CM | POA: Diagnosis not present

## 2019-10-31 DIAGNOSIS — M79604 Pain in right leg: Secondary | ICD-10-CM | POA: Diagnosis not present

## 2019-11-22 DIAGNOSIS — G894 Chronic pain syndrome: Secondary | ICD-10-CM | POA: Diagnosis not present

## 2019-11-22 DIAGNOSIS — Z79891 Long term (current) use of opiate analgesic: Secondary | ICD-10-CM | POA: Diagnosis not present

## 2019-11-22 DIAGNOSIS — M25559 Pain in unspecified hip: Secondary | ICD-10-CM | POA: Diagnosis not present

## 2019-11-22 DIAGNOSIS — M25569 Pain in unspecified knee: Secondary | ICD-10-CM | POA: Diagnosis not present

## 2019-12-16 ENCOUNTER — Telehealth: Payer: Self-pay | Admitting: Family Medicine

## 2019-12-16 NOTE — Telephone Encounter (Signed)
Please call patient and let him know that I need to see him since I have not seen him since 8/20. Also, need to get records from pain management and PT to evaluate next steps.

## 2019-12-16 NOTE — Telephone Encounter (Signed)
Tried calling pt mail box full 

## 2019-12-16 NOTE — Telephone Encounter (Signed)
Patient scheduled appointment on 12/18/19.

## 2019-12-16 NOTE — Telephone Encounter (Signed)
Debbie, please advise.  

## 2019-12-16 NOTE — Telephone Encounter (Signed)
Pt called wanting to see if he could get a referral for right  hip and knee pain.  He stated he talked to you about this at 1/21 appointment.  Offered appointment pt declined wanted to see if he could just get referral   Pt would like to go to [facility]

## 2019-12-18 ENCOUNTER — Other Ambulatory Visit: Payer: Self-pay

## 2019-12-18 ENCOUNTER — Ambulatory Visit (INDEPENDENT_AMBULATORY_CARE_PROVIDER_SITE_OTHER): Payer: Medicare HMO | Admitting: Family Medicine

## 2019-12-18 ENCOUNTER — Encounter: Payer: Self-pay | Admitting: Family Medicine

## 2019-12-18 DIAGNOSIS — M25551 Pain in right hip: Secondary | ICD-10-CM | POA: Diagnosis not present

## 2019-12-18 DIAGNOSIS — M25561 Pain in right knee: Secondary | ICD-10-CM | POA: Diagnosis not present

## 2019-12-18 DIAGNOSIS — I10 Essential (primary) hypertension: Secondary | ICD-10-CM | POA: Diagnosis not present

## 2019-12-18 DIAGNOSIS — N3946 Mixed incontinence: Secondary | ICD-10-CM | POA: Diagnosis not present

## 2019-12-18 DIAGNOSIS — G8929 Other chronic pain: Secondary | ICD-10-CM | POA: Diagnosis not present

## 2019-12-18 DIAGNOSIS — E781 Pure hyperglyceridemia: Secondary | ICD-10-CM | POA: Diagnosis not present

## 2019-12-18 LAB — POC URINALSYSI DIPSTICK (AUTOMATED)
Bilirubin, UA: NEGATIVE
Blood, UA: NEGATIVE
Glucose, UA: NEGATIVE
Ketones, UA: NEGATIVE
Leukocytes, UA: NEGATIVE
Nitrite, UA: NEGATIVE
Protein, UA: POSITIVE — AB
Spec Grav, UA: 1.025 (ref 1.010–1.025)
Urobilinogen, UA: 0.2 E.U./dL
pH, UA: 6 (ref 5.0–8.0)

## 2019-12-18 NOTE — Progress Notes (Signed)
Subjective:    Patient ID: Michael Payne, male    DOB: [birth year], 49 y.o.   MRN: [id number]  HPI Chief Complaint  Patient presents with  . Leg Pain    right    This is a 49 yo male, accompanied by his wife, who presents today for cc of right leg pain.   Had been seeing pain medication in [place], was going to PT for a couple of sessions until February 2021. He had a fall at home and did not go back to PT. Had difficulty with wife's work schedule. He can no longer get in and out of car without assistance, therefore can not drive. Has had chronic right hip pain, has gotten worse since 10/20. Using walker at home, last fall February.Chronic right hip pain and right knee pain.  Pain is constant, worse at night. Sometimes crying with pain. Has tried heat, ice, oxy 5/10, celebrex, ibuprofen-without significant relief. Marijuana 2 x 2x/ day, last smoked about 38 days ago.  Was seeing pain management who had prescribed physical therapy.  He has not continued with home exercises, feels very limited in what he is able to do.  His wife reports that she has tried to help him with his physical therapy exercises but they are too painful.  He was seen a couple of months ago for chronic shoulder pain and x-rays were ordered.  He was instructed to have those done at [facility] where he could be accommodated.  He did not get these done because he did not know where to go.     Morbid obesity- Tried vegetarian diet for 3 months- was eating vegetables, pasta, bread, etc. Some diarrhea when he reintroduced meats. Not currently following any eating plan. Was working with [facility] bariatric clinic and lost an insignificant amount of weight in an attempt to prepare for bariatric surgery.  As he got closer to being able to have surgery, unfortunately his wife required surgery and he was unable to continue follow-up.  He had requested referral to [facility] surgery last year for consideration for bariatric surgery with them  as well as for an abdominal hernia.  The referral was placed and unfortunately there was a lack of follow-through on patient's part.  He is still interested in the services.  According to the patient's wife, it is not uncommon for him to be very hungry including in the middle of the night and have large meals.  Patient reports that he primarily eats seafood and vegetables with occasional splurges.  He says he was given information through [facility] regarding what to eat and counting macros and that he "knows what to do."    Urinary frequency, frequent nocturia. Stress incontinence, urge incontinence- chronic problems.   [facility]- has not had follow up with neurology, can't get new supplies, has been in touch with them regarding follow up.   Review of Systems No chest pain, dyspnea on exertion unchanged.  Per HPI    Objective:   Physical Exam Vitals reviewed.  Constitutional:      General: He is not in acute distress.    Appearance: He is obese. He is not ill-appearing, toxic-appearing or diaphoretic.  HENT:     Head: Normocephalic and atraumatic.  Eyes:     Conjunctiva/sclera: Conjunctivae normal.  Cardiovascular:     Rate and Rhythm: Normal rate and regular rhythm.  Pulmonary:     Effort: Pulmonary effort is normal.     Breath sounds: Normal breath sounds.  Musculoskeletal:     Right lower leg: Edema (trace) present.     Left lower leg: Edema (trace) present.     Comments: Physical exam limited due to patient's inability to move to exam table.  No obvious swelling of right knee.  Unable to examine right hip.  He has a very short gait when using his walker, right foot turned out slightly.  Skin:    General: Skin is warm and dry.  Neurological:     Mental Status: He is alert and oriented to person, place, and time.  Psychiatric:        Mood and Affect: Mood normal.        Behavior: Behavior normal.        Thought Content: Thought content normal.        Judgment: Judgment normal.        BP 132/82 (BP Location: Left Arm, Patient Position: Sitting, Cuff Size: Normal) Comment: Unable to weigh  Pulse 82   Temp 98.4 F (36.9 C) (Temporal)   SpO2 94%  Wt Readings from Last 3 Encounters:  12/18/19 (!) 501 lb (227.3 kg)  10/10/19 (!) 508 lb (230.4 kg)  05/06/19 (!) 506 lb (229.5 kg)   Results for orders placed or performed in visit on 12/18/19  POCT Urinalysis Dipstick (Automated)  Result Value Ref Range   Color, UA Yellow    Clarity, UA Clear    Glucose, UA Negative Negative   Bilirubin, UA Neg    Ketones, UA Neg    Spec Grav, UA 1.025 1.010 - 1.025   Blood, UA Neg    pH, UA 6.0 5.0 - 8.0   Protein, UA Positive (A) Negative   Urobilinogen, UA 0.2 0.2 or 1.0 E.U./dL   Nitrite, UA Neg    Leukocytes, UA Negative Negative       Assessment & Plan:  1. Morbid obesity ([place]) -Patient at the same weight as he was when I met him 5 years ago.  Discussed this with him and next steps.  Provided him some information and resources.  His referral is still good to [facility] surgery and he was given the number to call and schedule an appointment with them. - TSH - Vitamin D, 25-hydroxy - CBC with Differential - Comprehensive metabolic panel  2. Essential hypertension -Well-controlled on current medications - TSH - Vitamin D, 25-hydroxy - CBC with Differential - Comprehensive metabolic panel  3. Hypertriglyceridemia - Lipid Panel  4. Mixed stress and urge urinary incontinence -Urinalysis unremarkable other than some protein, consider referral to urology if persistent symptoms - POCT Urinalysis Dipstick (Automated)  5. Chronic pain of right knee -Discussed necessity of weight loss, increasing activity as tolerated including exercises while supine and sitting in chair. - AMB referral to orthopedics  6. Right hip pain -See #5 - AMB referral to orthopedics -Follow-up for complete physical exam in August  This visit occurred during the SARS-CoV-2 public health  emergency.  Safety protocols were in place, including screening questions prior to the visit, additional usage of staff PPE, and extensive cleaning of exam room while observing appropriate contact time as indicated for disinfecting solutions.   Over 45 minutes were spent face-to-face with the patient during this encounter and >50% of that time was spent on counseling and coordination of care   Clarene Reamer, FNP-BC  [facility] at [facility], [place]  12/18/2019 6:04 PM

## 2019-12-18 NOTE — Patient Instructions (Signed)
I have put in a referral to [facility], you should get a call soon  Please call [facility] at [phone]  for an appointment for your hernia/ bariatric surgery appointment.   I will call you about your lab results   Please schedule your complete physical for 6 months  Please look at Dr. Shanda Howells, Dr. Wylene Simmer, Dr. Gery Pray- all on You tube

## 2019-12-19 ENCOUNTER — Other Ambulatory Visit: Payer: Self-pay | Admitting: Family Medicine

## 2019-12-19 DIAGNOSIS — M25551 Pain in right hip: Secondary | ICD-10-CM

## 2019-12-19 LAB — COMPREHENSIVE METABOLIC PANEL
ALT: 34 U/L (ref 0–53)
AST: 26 U/L (ref 0–37)
Albumin: 4.2 g/dL (ref 3.5–5.2)
Alkaline Phosphatase: 67 U/L (ref 39–117)
BUN: 14 mg/dL (ref 6–23)
CO2: 31 mEq/L (ref 19–32)
Calcium: 9.2 mg/dL (ref 8.4–10.5)
Chloride: 103 mEq/L (ref 96–112)
Creatinine, Ser: 0.85 mg/dL (ref 0.40–1.50)
GFR: 116.29 mL/min (ref >60.00)
Glucose, Bld: 86 mg/dL (ref 70–99)
Potassium: 4.6 mEq/L (ref 3.5–5.1)
Sodium: 141 mEq/L (ref 135–145)
Total Bilirubin: 0.3 mg/dL (ref 0.2–1.2)
Total Protein: 7.5 g/dL (ref 6.0–8.3)

## 2019-12-19 LAB — LIPID PANEL
Cholesterol: 153 mg/dL (ref 0–200)
HDL: 36.9 mg/dL — ABNORMAL LOW (ref >39.00)
LDL Cholesterol: 83 mg/dL (ref 0–99)
NonHDL: 116.27
Total CHOL/HDL Ratio: 4
Triglycerides: 168 mg/dL — ABNORMAL HIGH (ref 0.0–149.0)
VLDL: 33.6 mg/dL (ref 0.0–40.0)

## 2019-12-19 LAB — CBC WITH DIFFERENTIAL/PLATELET
Basophils Absolute: 0.1 10*3/uL (ref 0.0–0.1)
Basophils Relative: 1 % (ref 0.0–3.0)
Eosinophils Absolute: 0.2 10*3/uL (ref 0.0–0.7)
Eosinophils Relative: 3.1 % (ref 0.0–5.0)
HCT: 39.3 % (ref 39.0–52.0)
Hemoglobin: 12.9 g/dL — ABNORMAL LOW (ref 13.0–17.0)
Lymphocytes Relative: 24.2 % (ref 12.0–46.0)
Lymphs Abs: 1.6 10*3/uL (ref 0.7–4.0)
MCHC: 32.8 g/dL (ref 30.0–36.0)
MCV: 94 fl (ref 78.0–100.0)
Monocytes Absolute: 0.6 10*3/uL (ref 0.1–1.0)
Monocytes Relative: 9.7 % (ref 3.0–12.0)
Neutro Abs: 4.1 10*3/uL (ref 1.4–7.7)
Neutrophils Relative %: 62 % (ref 43.0–77.0)
Platelets: 258 10*3/uL (ref 150.0–400.0)
RBC: 4.18 Mil/uL — ABNORMAL LOW (ref 4.22–5.81)
RDW: 14 % (ref 11.5–15.5)
WBC: 6.7 10*3/uL (ref 4.0–10.5)

## 2019-12-19 LAB — VITAMIN D 25 HYDROXY (VIT D DEFICIENCY, FRACTURES): VITD: 10.08 ng/mL — ABNORMAL LOW (ref 30.00–100.00)

## 2019-12-19 LAB — TSH: TSH: 2.63 u[IU]/mL (ref 0.35–4.50)

## 2019-12-23 DIAGNOSIS — G894 Chronic pain syndrome: Secondary | ICD-10-CM | POA: Diagnosis not present

## 2019-12-23 DIAGNOSIS — M069 Rheumatoid arthritis, unspecified: Secondary | ICD-10-CM | POA: Diagnosis not present

## 2019-12-23 DIAGNOSIS — Z79891 Long term (current) use of opiate analgesic: Secondary | ICD-10-CM | POA: Diagnosis not present

## 2019-12-23 DIAGNOSIS — M25569 Pain in unspecified knee: Secondary | ICD-10-CM | POA: Diagnosis not present

## 2019-12-23 DIAGNOSIS — M25559 Pain in unspecified hip: Secondary | ICD-10-CM | POA: Diagnosis not present

## 2019-12-27 ENCOUNTER — Emergency Department (HOSPITAL_COMMUNITY): Payer: Medicare HMO

## 2019-12-27 ENCOUNTER — Inpatient Hospital Stay (HOSPITAL_COMMUNITY)
Admission: EM | Admit: 2019-12-27 | Discharge: 2020-01-03 | DRG: 554 | Disposition: A | Discharge status: Skilled Nursing Facility | Payer: Medicare HMO | Attending: Internal Medicine | Admitting: Internal Medicine

## 2019-12-27 ENCOUNTER — Other Ambulatory Visit: Payer: Self-pay

## 2019-12-27 DIAGNOSIS — Z20822 Contact with and (suspected) exposure to covid-19: Secondary | ICD-10-CM | POA: Diagnosis not present

## 2019-12-27 DIAGNOSIS — M62838 Other muscle spasm: Secondary | ICD-10-CM | POA: Diagnosis not present

## 2019-12-27 DIAGNOSIS — K219 Gastro-esophageal reflux disease without esophagitis: Secondary | ICD-10-CM | POA: Diagnosis present

## 2019-12-27 DIAGNOSIS — M2550 Pain in unspecified joint: Secondary | ICD-10-CM | POA: Diagnosis not present

## 2019-12-27 DIAGNOSIS — G4733 Obstructive sleep apnea (adult) (pediatric): Secondary | ICD-10-CM | POA: Diagnosis not present

## 2019-12-27 DIAGNOSIS — R202 Paresthesia of skin: Secondary | ICD-10-CM | POA: Diagnosis not present

## 2019-12-27 DIAGNOSIS — I10 Essential (primary) hypertension: Secondary | ICD-10-CM | POA: Diagnosis not present

## 2019-12-27 DIAGNOSIS — K5900 Constipation, unspecified: Secondary | ICD-10-CM | POA: Diagnosis not present

## 2019-12-27 DIAGNOSIS — Z886 Allergy status to analgesic agent status: Secondary | ICD-10-CM | POA: Diagnosis not present

## 2019-12-27 DIAGNOSIS — W19XXXA Unspecified fall, initial encounter: Secondary | ICD-10-CM | POA: Diagnosis not present

## 2019-12-27 DIAGNOSIS — Z6841 Body Mass Index (BMI) 40.0 and over, adult: Secondary | ICD-10-CM | POA: Diagnosis not present

## 2019-12-27 DIAGNOSIS — M1990 Unspecified osteoarthritis, unspecified site: Secondary | ICD-10-CM | POA: Diagnosis not present

## 2019-12-27 DIAGNOSIS — M25551 Pain in right hip: Secondary | ICD-10-CM | POA: Diagnosis not present

## 2019-12-27 DIAGNOSIS — R5381 Other malaise: Secondary | ICD-10-CM | POA: Diagnosis not present

## 2019-12-27 DIAGNOSIS — M1610 Unilateral primary osteoarthritis, unspecified hip: Secondary | ICD-10-CM

## 2019-12-27 DIAGNOSIS — R739 Hyperglycemia, unspecified: Secondary | ICD-10-CM | POA: Diagnosis not present

## 2019-12-27 DIAGNOSIS — Z7401 Bed confinement status: Secondary | ICD-10-CM | POA: Diagnosis not present

## 2019-12-27 DIAGNOSIS — E781 Pure hyperglyceridemia: Secondary | ICD-10-CM | POA: Diagnosis present

## 2019-12-27 DIAGNOSIS — R262 Difficulty in walking, not elsewhere classified: Secondary | ICD-10-CM | POA: Diagnosis not present

## 2019-12-27 DIAGNOSIS — M6281 Muscle weakness (generalized): Secondary | ICD-10-CM | POA: Diagnosis not present

## 2019-12-27 DIAGNOSIS — M25561 Pain in right knee: Secondary | ICD-10-CM | POA: Diagnosis not present

## 2019-12-27 DIAGNOSIS — Z801 Family history of malignant neoplasm of trachea, bronchus and lung: Secondary | ICD-10-CM | POA: Diagnosis not present

## 2019-12-27 DIAGNOSIS — Z8249 Family history of ischemic heart disease and other diseases of the circulatory system: Secondary | ICD-10-CM | POA: Diagnosis not present

## 2019-12-27 DIAGNOSIS — R52 Pain, unspecified: Secondary | ICD-10-CM

## 2019-12-27 DIAGNOSIS — M79604 Pain in right leg: Secondary | ICD-10-CM | POA: Diagnosis not present

## 2019-12-27 DIAGNOSIS — S79911A Unspecified injury of right hip, initial encounter: Secondary | ICD-10-CM | POA: Diagnosis not present

## 2019-12-27 DIAGNOSIS — Z87891 Personal history of nicotine dependence: Secondary | ICD-10-CM | POA: Diagnosis not present

## 2019-12-27 DIAGNOSIS — R2689 Other abnormalities of gait and mobility: Secondary | ICD-10-CM | POA: Diagnosis not present

## 2019-12-27 DIAGNOSIS — M1611 Unilateral primary osteoarthritis, right hip: Secondary | ICD-10-CM | POA: Diagnosis not present

## 2019-12-27 DIAGNOSIS — R2681 Unsteadiness on feet: Secondary | ICD-10-CM | POA: Diagnosis not present

## 2019-12-27 LAB — URINALYSIS, ROUTINE W REFLEX MICROSCOPIC
Bilirubin Urine: NEGATIVE
Glucose, UA: NEGATIVE mg/dL
Hgb urine dipstick: NEGATIVE
Ketones, ur: NEGATIVE mg/dL
Nitrite: NEGATIVE
Protein, ur: NEGATIVE mg/dL
Specific Gravity, Urine: 1.025 (ref 1.005–1.030)
pH: 5 (ref 5.0–8.0)

## 2019-12-27 LAB — COMPREHENSIVE METABOLIC PANEL
ALT: 42 U/L (ref 0–44)
AST: 32 U/L (ref 15–41)
Albumin: 3.7 g/dL (ref 3.5–5.0)
Alkaline Phosphatase: 67 U/L (ref 38–126)
Anion gap: 11 (ref 5–15)
BUN: 11 mg/dL (ref 6–20)
CO2: 25 mmol/L (ref 22–32)
Calcium: 9.2 mg/dL (ref 8.9–10.3)
Chloride: 104 mmol/L (ref 98–111)
Creatinine, Ser: 0.82 mg/dL (ref 0.61–1.24)
GFR calc Af Amer: >60 mL/min (ref >60)
GFR calc non Af Amer: >60 mL/min (ref >60)
Glucose, Bld: 119 mg/dL — ABNORMAL HIGH (ref 70–99)
Potassium: 4 mmol/L (ref 3.5–5.1)
Sodium: 140 mmol/L (ref 135–145)
Total Bilirubin: 0.4 mg/dL (ref 0.3–1.2)
Total Protein: 7.6 g/dL (ref 6.5–8.1)

## 2019-12-27 LAB — CBC WITH DIFFERENTIAL/PLATELET
Abs Immature Granulocytes: 0.03 10*3/uL (ref 0.00–0.07)
Basophils Absolute: 0 10*3/uL (ref 0.0–0.1)
Basophils Relative: 1 %
Eosinophils Absolute: 0.2 10*3/uL (ref 0.0–0.5)
Eosinophils Relative: 3 %
HCT: 44.1 % (ref 39.0–52.0)
Hemoglobin: 13.9 g/dL (ref 13.0–17.0)
Immature Granulocytes: 0 %
Lymphocytes Relative: 24 %
Lymphs Abs: 1.9 10*3/uL (ref 0.7–4.0)
MCH: 30.5 pg (ref 26.0–34.0)
MCHC: 31.5 g/dL (ref 30.0–36.0)
MCV: 96.9 fL (ref 80.0–100.0)
Monocytes Absolute: 0.7 10*3/uL (ref 0.1–1.0)
Monocytes Relative: 8 %
Neutro Abs: 5.1 10*3/uL (ref 1.7–7.7)
Neutrophils Relative %: 64 %
Platelets: 248 10*3/uL (ref 150–400)
RBC: 4.55 MIL/uL (ref 4.22–5.81)
RDW: 13.2 % (ref 11.5–15.5)
WBC: 8 10*3/uL (ref 4.0–10.5)
nRBC: 0 % (ref 0.0–0.2)

## 2019-12-27 MED ORDER — KETOROLAC TROMETHAMINE 30 MG/ML IJ SOLN: 30.0000 mg | Freq: Four times a day (QID) | INTRAMUSCULAR | Status: DC | PRN

## 2019-12-27 MED ORDER — MORPHINE SULFATE (PF) 4 MG/ML IV SOLN
4.0000 mg | Freq: Once | INTRAVENOUS | Status: AC
  Administered 2019-12-27: 4 mg via INTRAMUSCULAR
  Filled 2019-12-27: qty 1

## 2019-12-27 MED ORDER — ALLOPURINOL 300 MG PO TABS
300.0000 mg | ORAL_TABLET | Freq: Two times a day (BID) | ORAL | Status: DC
  Administered 2019-12-28 – 2020-01-03 (×14): 300 mg via ORAL
  Filled 2019-12-27 (×13): qty 1

## 2019-12-27 MED ORDER — VITAMIN D 25 MCG (1000 UNIT) PO TABS
1000.0000 [IU] | ORAL_TABLET | Freq: Every day | ORAL | Status: DC
  Administered 2019-12-28 – 2020-01-03 (×7): 1000 [IU] via ORAL
  Filled 2019-12-27 (×7): qty 1

## 2019-12-27 MED ORDER — OXYCODONE-ACETAMINOPHEN 10-325 MG PO TABS: 1.0000 | ORAL_TABLET | ORAL | Status: DC | PRN

## 2019-12-27 MED ORDER — SODIUM CHLORIDE 0.9 % IV SOLN: INTRAVENOUS | Status: DC

## 2019-12-27 MED ORDER — OXYCODONE HCL 5 MG PO TABS
5.0000 mg | ORAL_TABLET | ORAL | Status: DC | PRN
  Administered 2019-12-28 – 2020-01-02 (×11): 5 mg via ORAL
  Filled 2019-12-27 (×11): qty 1

## 2019-12-27 MED ORDER — MORPHINE SULFATE (PF) 4 MG/ML IV SOLN
4.0000 mg | INTRAVENOUS | Status: DC | PRN
  Administered 2019-12-27 – 2019-12-30 (×3): 4 mg via INTRAVENOUS
  Filled 2019-12-27 (×3): qty 1

## 2019-12-27 MED ORDER — OXYCODONE-ACETAMINOPHEN 5-325 MG PO TABS
1.0000 | ORAL_TABLET | ORAL | Status: DC | PRN
  Administered 2019-12-28 – 2020-01-02 (×14): 1 via ORAL
  Filled 2019-12-27 (×14): qty 1

## 2019-12-27 MED ORDER — TIZANIDINE HCL 2 MG PO TABS
2.0000 mg | ORAL_TABLET | Freq: Three times a day (TID) | ORAL | Status: DC | PRN
  Administered 2019-12-28 – 2020-01-02 (×11): 2 mg via ORAL
  Filled 2019-12-27 (×11): qty 1

## 2019-12-27 MED ORDER — ONDANSETRON HCL 4 MG/2ML IJ SOLN: 4.0000 mg | Freq: Four times a day (QID) | INTRAMUSCULAR | Status: DC | PRN

## 2019-12-27 MED ORDER — COLCHICINE 0.6 MG PO TABS
0.6000 mg | ORAL_TABLET | Freq: Two times a day (BID) | ORAL | Status: DC
  Administered 2019-12-28 – 2020-01-03 (×14): 0.6 mg via ORAL
  Filled 2019-12-27 (×13): qty 1

## 2019-12-27 MED ORDER — ENOXAPARIN SODIUM 120 MG/0.8ML ~~LOC~~ SOLN
110.0000 mg | Freq: Every day | SUBCUTANEOUS | Status: AC
  Administered 2019-12-28 – 2019-12-29 (×2): 110 mg via SUBCUTANEOUS
  Filled 2019-12-27 (×2): qty 0.73

## 2019-12-27 MED ORDER — AMLODIPINE BESYLATE 10 MG PO TABS
10.0000 mg | ORAL_TABLET | Freq: Every day | ORAL | Status: DC
  Administered 2019-12-28 – 2020-01-03 (×7): 10 mg via ORAL
  Filled 2019-12-27 (×7): qty 1

## 2019-12-27 MED ORDER — ONDANSETRON HCL 4 MG PO TABS: 4.0000 mg | ORAL_TABLET | Freq: Four times a day (QID) | ORAL | Status: DC | PRN

## 2019-12-27 NOTE — ED Provider Notes (Signed)
[facility] EMERGENCY DEPARTMENT Provider Note   CSN: [id number] Arrival date & time: 12/27/19  1428     History No chief complaint on file.   Michael Payne is a 49 y.o. male.  HPI Patient reports he fell 4 days ago.  He describes a mechanical fall.  He at baseline uses a walker.  He reports he lost his balance and fell backwards landing on his right hip and lower back.  He could not get up by himself.  He called EMS and they helped him get back up.  He reports he used his walker to walk back to his bed and has not been able to get up out of bed since then.  Patient reports he also fell about 2 weeks earlier.  He reports since that time he has been experiencing some numbness in his legs.  He reports the numbness is greater in the right leg.  He describes it is moving up and down the leg.  He reports most of his pain seems to be around the right hip and groin region.  He also however experiences pain down into the knee.  Patient is not on any anticoagulants.  He has distant histories of a left knee surgery and a right ankle surgery.    Past Medical History:  Diagnosis Date  . Anemia    borderline anemia, was instructed to eat more iron filled foods  . GERD (gastroesophageal reflux disease)    lactose intolerance  . HTN (hypertension)     Patient Active Problem List   Diagnosis Date Noted  . Fall 12/27/2019  . Severe obstructive sleep apnea 02/22/2018  . Hypertriglyceridemia 08/24/2017  . Low HDL (under 40) 08/24/2017  . Metabolic syndrome 72/53/6644  . Morbid obesity ([place]) 10/14/2013  . HTN (hypertension) 10/14/2013  . Chest pain 07/26/2012    Past Surgical History:  Procedure Laterality Date  . FOOT SURGERY    . KNEE ARTHROSCOPY WITH MEDIAL MENISECTOMY Left 12/16/2014   Procedure: KNEE ARTHROSCOPY WITH MEDIAL MENISECTOMY;  Surgeon: Renette Butters, MD;  Location: [facility];  Service: Orthopedics;  Laterality: Left;       Family History  Problem Relation  Age of Onset  . Heart attack Mother 71  . Lung cancer Father   . Cancer Father   . CAD Maternal Grandfather     Social History   Tobacco Use  . Smoking status: Former Smoker    Packs/day: 2.00    Years: 22.00    Pack years: 44.00    Types: Cigarettes    Quit date: 02/24/2011    Years since quitting: 8.8  . Smokeless tobacco: Never Used  Substance Use Topics  . Alcohol use: Yes    Comment: Weekends/beer-- has slowed down  alot  . Drug use: Not Currently    Types: Marijuana    Comment: former -  quit in 2012    Home Medications Prior to Admission medications   Medication Sig Start Date End Date Taking? Authorizing Provider  Acetaminophen 500 MG coapsule  07/24/18   [provider]  allopurinol (ZYLOPRIM) 300 MG tablet Take 1 tablet (300 mg total) by mouth 2 (two) times daily. 07/24/19   Elby Beck, FNP  AMITIZA 24 MCG capsule TAKE 1 CAPSULE BY MOUTH TWICE A DAY WITH FOOD AND WATER 03/26/18   [provider]  amLODipine (NORVASC) 10 MG tablet Take 1 tablet (10 mg total) by mouth daily. 07/24/19   Elby Beck, FNP  benzocaine-menthol (CHLORAEPTIC) 6-10 MG lozenge  07/24/18   [provider]  Calcium-Vitamin D-Vitamin K (CHEWABLE CALCIUM PO)  07/24/18   [provider]  CALCIUM/VITAMIN D3/ADULT GUMMY 250-100-500 MG-MG-UNIT CHEW  07/24/18   [provider]  celecoxib (CELEBREX) 200 MG capsule TAKE 1 CAPSULE BY MOUTH 2 TIMES DAILY AS NEEDED FOR MODERATE PAIN. 12/20/19   Emi Belfast, FNP  COLCRYS 0.6 MG tablet TAKE 1 TABLET BY MOUTH DAILY AS NEEDED 11/08/18   Emi Belfast, FNP  Melatonin 5 MG TABS Take by mouth. 04/25/18   [provider]  Melatonin 5 MG TABS  07/24/18   [provider]  oxyCODONE-acetaminophen (PERCOCET) 10-325 MG tablet Take 1 tablet by mouth 4 (four) times daily as needed. 09/25/19   [provider]  oxyCODONE-acetaminophen (PERCOCET/ROXICET) 5-325 MG tablet  11/22/19   [provider]  tiZANidine (ZANAFLEX) 2 MG tablet Take 2 mg by mouth 3 (three) times daily as needed. 07/24/18   [provider]  triamterene-hydrochlorothiazide (MAXZIDE-25) 37.5-25 MG tablet Take 1 tablet by mouth daily. 03/26/18   [provider]    Allergies    Aspirin  Review of Systems   Review of Systems 10 Systems reviewed and are negative for acute change except as noted in the HPI.  Physical Exam Updated Vital Signs BP (!) 149/80   Pulse 78   Temp 98.3 F (36.8 C) (Oral)   Resp 18   Ht 6\' 4"  (1.93 m)   Wt (!) 227.3 kg   SpO2 99%   BMI 60.98 kg/m   Physical Exam Constitutional:      Comments: Patient is alert nontoxic.  No respiratory distress.  Status clear.  Morbid obesity.  Patient is uncomfortable and winces in pain with certain movements of the legs.  HENT:     Head: Normocephalic and atraumatic.  Eyes:     Extraocular Movements: Extraocular movements intact.  Cardiovascular:     Rate and Rhythm: Normal rate and regular rhythm.  Pulmonary:     Effort: Pulmonary effort is normal.     Breath sounds: Normal breath sounds.  Abdominal:     General: There is no distension.     Palpations: Abdomen is soft.     Tenderness: There is no abdominal tenderness. There is no guarding.  Musculoskeletal:     Comments: Bilateral lower extremities are symmetric.  Patient does not have any peripheral edema.  Calves are soft and pliable.  Patient has morbid central obesity but the lower extremities are in good condition without severe obesity from the knee down.  Dorsalis pedis pulses are 2+ strong and symmetric.  Feet and ankles are in good condition.  Knees do not have any effusion or deformity.  No erythema.  Patient does have significant obesity of the lower abdomen and around the hips.  He does endorse some discomfort with significant pressure over the greater trochanter region on the right hip.  He also experiences pain with movement of the right knee and hip.   He identifies that is concentrating more towards the lateral right hip and groin region.  He has intact neurologic function patient has good function to move his toes and feet.  Skin:    General: Skin is warm and dry.  Neurological:     General: No focal deficit present.     Mental Status: He is oriented to person, place, and time.  Psychiatric:        Mood and Affect: Mood normal.  ED Results / Procedures / Treatments   Labs (all labs ordered are listed, but only abnormal results are displayed) Labs Reviewed  COMPREHENSIVE METABOLIC PANEL - Abnormal; Notable for the following components:      Result Value   Glucose, Bld 119 (*)    All other components within normal limits  URINALYSIS, ROUTINE W REFLEX MICROSCOPIC - Abnormal; Notable for the following components:   Leukocytes,Ua TRACE (*)    Bacteria, UA RARE (*)    All other components within normal limits  SARS CORONAVIRUS 2 (TAT 6-24 HRS)  CBC WITH DIFFERENTIAL/PLATELET    EKG None  Radiology DG Knee 2 Views Right  Result Date: 12/27/2019 CLINICAL DATA:  Right hip pain for 6 months. EXAM: RIGHT KNEE - 1-2 VIEW COMPARISON:  10/14/2013 FINDINGS: AP and lateral views of the right knee without signs of fracture or dislocation. Tricompartmental degenerative changes. Possible small joint effusion. Suggestion of edema about the calf. IMPRESSION: Mild tricompartmental degenerative changes with possible small joint effusion. Question lower extremity edema. Electronically Signed   By: Donzetta Kohut M.D.   On: 12/27/2019 16:34   CT Hip Right Wo Contrast  Result Date: 12/27/2019 CLINICAL DATA:  Hip trauma, fracture suspected. Right leg pain for 6 months, with inability to bear weight for 2 days. EXAM: CT OF THE RIGHT HIP WITHOUT CONTRAST TECHNIQUE: Multidetector CT imaging of the right hip was performed according to the standard protocol. Multiplanar CT image reconstructions were also generated. COMPARISON:  Radiographs 12/27/2019  FINDINGS: Bones/Joint/Cartilage Severe right hip osteoarthritis with joint space narrowing, osteophytes, subchondral sclerosis and cyst formation. No evidence of acute fracture or dislocation. No large joint effusion seen. Additional degenerative changes are present within the lower lumbar spine and right sacroiliac joint. Ligaments Suboptimally assessed by CT. Muscles and Tendons No focal muscular abnormality. Soft tissues No focal periarticular hematoma or other fluid collection. The visualized internal pelvic contents are unremarkable. IMPRESSION: Severe right hip osteoarthritis. No evidence of acute fracture or dislocation. Electronically Signed   By: Carey Bullocks M.D.   On: 12/27/2019 18:57   DG Hip Unilat With Pelvis 2-3 Views Right  Result Date: 12/27/2019 CLINICAL DATA:  Fall. RIGHT hip pain for 6 months. EXAM: DG HIP (WITH OR WITHOUT PELVIS) 2-3V RIGHT COMPARISON:  None. FINDINGS: There are significant degenerative changes in the RIGHT hip. The LATERAL view is nondiagnostic secondary to technique/patient body habitus. Although no displaced fracture is identified, minimally displaced fracture would be difficult to exclude given the technique. Degenerative changes are seen in the LEFT hip, to a lesser degree. Visualized portion of the pelvis is unremarkable. IMPRESSION: 1. Significant degenerative changes in the RIGHT hip. 2. No evidence for acute abnormality. 3. Consider further imaging if there is strong clinical concern for fracture. Electronically Signed   By: Norva Pavlov M.D.   On: 12/27/2019 16:32    Procedures Procedures (including critical care time)  Medications Ordered in ED Medications  morphine 4 MG/ML injection 4 mg (has no administration in time range)  morphine 4 MG/ML injection 4 mg (4 mg Intramuscular Given 12/27/19 1531)    ED Course  I have reviewed the triage vital signs and the nursing notes.  Pertinent labs & imaging results that were available during my care of  the patient were reviewed by me and considered in my medical decision making (see chart for details).  Clinical Course as of Dec 26 2140  Fri Dec 27, 2019  2025 Consult: Reviewed with Dr. Eulah Pont.  Advises patient can  be admitted to medical service for PT OT and he will consult in the morning to evaluate for intra-articular hip injection to try to facilitate ambulation.   [MP]  2141 Consult: Dr. Mikeal Hawthorne for admission   [MP]    Clinical Course User Index [MP] Arby Barrette, MD   MDM Rules/Calculators/A&P                     Patient presents as outlined above.  He had a fall 3 days ago and has not been able to weight-bear since.  CT does not show occult fracture but severe degenerative and osteophyte destruction of the right hip.  Patient has been bedbound for 3 days due to pain.  He is taking Percocet 10 mg tablets at home with no relief.  His wife has had to help him to toilet in the bed and care for him completely in bed.  He is unable to ambulate to his bathroom which is only a few feet away in their bedroom.  At this time patient will be admitted for ambulatory dysfunction with severe degenerative disease of the right hip and morbid obesity.  Dr. Eulah Pont consulted.  Will try intra-articular injections.  Patient may require inpatient rehab therapy if he cannot be ambulatory with pain control. Final Clinical Impression(s) / ED Diagnoses Final diagnoses:  Osteoarthritis of right hip, unspecified osteoarthritis type  Ambulatory dysfunction  Morbid obesity [facility])    Rx / DC Orders ED Discharge Orders    None       Arby Barrette, MD 12/27/19 2144

## 2019-12-27 NOTE — H&P (Signed)
History and Physical   Michael Payne:[id number] DOB: [birth year] DOA: 12/27/2019  Referring MD/NP/PA: Dr. Donnald Garre  PCP: Michael Belfast, FNP   Outpatient Specialists: Dr. Eulah Pont orthopedics  Patient coming from: Home  Chief Complaint: Fall with right hip pain  HPI: Michael Payne is a 49 y.o. male with medical history significant of morbid obesity, hypertension, obstructive sleep apnea, severe osteoarthritis of the right hip who apparently had been trying to get to orthopedics and scheduled for outpatient visit due to severe right hip osteoarthritis.  Patient sustained a fall about a week ago and since then has been unable to walk on his right lower extremity.  He has literally been bedbound with his wife who is primary caregiver his condition is deteriorating due to pain at 10 out of 10 in the hip.  No other site of injury.  Patient has obstructive sleep apnea uses CPAP.  Due to worsening symptoms he was brought to the ER where he was evaluated.  Orthopedics consulted and Dr. Eulah Pont recommends admission to the hospital for pain management and he will consider steroid injections in the hips followed by physical therapy and outpatient therapy.  Patient has had mild relief from IV pain medications in the ER..  ED Course: Temperature is 98.3 blood pressure 149/80 pulse 93 respirate 23 oxygen sat 98% on room air.  Patient's weight is 227.  CBC and chemistry appear to all be within normal.  CT of the right hip shows severe osteoarthritis of the right hip joint.  X-ray of the right hip shows similar findings.  EKG showed no significant changes.  Patient has been treated with pain medicine.  Orthopedic consult and he is being admitted to the hospital for treatment.  Review of Systems: As per HPI otherwise 10 point review of systems negative.    Past Medical History:  Diagnosis Date  . Anemia    borderline anemia, was instructed to eat more iron filled foods  . GERD (gastroesophageal reflux  disease)    lactose intolerance  . HTN (hypertension)     Past Surgical History:  Procedure Laterality Date  . FOOT SURGERY    . KNEE ARTHROSCOPY WITH MEDIAL MENISECTOMY Left 12/16/2014   Procedure: KNEE ARTHROSCOPY WITH MEDIAL MENISECTOMY;  Surgeon: Michael Apley, MD;  Location: [facility] OR;  Service: Orthopedics;  Laterality: Left;     reports that he quit smoking about 8 years ago. His smoking use included cigarettes. He has a 44.00 pack-year smoking history. He has never used smokeless tobacco. He reports current alcohol use. He reports previous drug use. Drug: Marijuana.  Allergies  Allergen Reactions  . Aspirin Other (See Comments)    Upset stomach    Family History  Problem Relation Age of Onset  . Heart attack Mother 48  . Lung cancer Father   . Cancer Father   . CAD Maternal Grandfather      Prior to Admission medications   Medication Sig Start Date End Date Taking? Authorizing Provider  Acetaminophen 500 MG coapsule  07/24/18   [provider]  allopurinol (ZYLOPRIM) 300 MG tablet Take 1 tablet (300 mg total) by mouth 2 (two) times daily. 07/24/19   Michael Belfast, FNP  AMITIZA 24 MCG capsule TAKE 1 CAPSULE BY MOUTH TWICE A DAY WITH FOOD AND WATER 03/26/18   [provider]  amLODipine (NORVASC) 10 MG tablet Take 1 tablet (10 mg total) by mouth daily. 07/24/19   Michael Belfast, FNP  benzocaine-menthol (CHLORAEPTIC) 6-10  MG lozenge  07/24/18   [provider]  Calcium-Vitamin D-Vitamin K (CHEWABLE CALCIUM PO)  07/24/18   [provider]  CALCIUM/VITAMIN D3/ADULT GUMMY 250-100-500 MG-MG-UNIT CHEW  07/24/18   [provider]  celecoxib (CELEBREX) 200 MG capsule TAKE 1 CAPSULE BY MOUTH 2 TIMES DAILY AS NEEDED FOR MODERATE PAIN. 12/20/19   Elby Beck, FNP  COLCRYS 0.6 MG tablet TAKE 1 TABLET BY MOUTH DAILY AS NEEDED 11/08/18   Elby Beck, FNP  Melatonin 5 MG TABS Take by mouth. 04/25/18   [provider]    Melatonin 5 MG TABS  07/24/18   [provider]  oxyCODONE-acetaminophen (PERCOCET) 10-325 MG tablet Take 1 tablet by mouth 4 (four) times daily as needed. 09/25/19   [provider]  oxyCODONE-acetaminophen (PERCOCET/ROXICET) 5-325 MG tablet  11/22/19   [provider]  tiZANidine (ZANAFLEX) 2 MG tablet Take 2 mg by mouth 3 (three) times daily as needed. 07/24/18   [provider]  triamterene-hydrochlorothiazide (MAXZIDE-25) 37.5-25 MG tablet Take 1 tablet by mouth daily. 03/26/18   [provider]    Physical Exam: Vitals:   12/27/19 1920 12/27/19 1930 12/27/19 1945 12/27/19 2000  BP: 122/62 130/75 136/65 (!) 149/80  Pulse: 76 72 74 78  Resp: 18 19 18 18   Temp:      TempSrc:      SpO2: 99% 98% 99% 99%  Weight:      Height:          Constitutional: Morbidly obese, in obvious distress due to pain Vitals:   12/27/19 1920 12/27/19 1930 12/27/19 1945 12/27/19 2000  BP: 122/62 130/75 136/65 (!) 149/80  Pulse: 76 72 74 78  Resp: 18 19 18 18   Temp:      TempSrc:      SpO2: 99% 98% 99% 99%  Weight:      Height:       Eyes: PERRL, lids and conjunctivae normal ENMT: Mucous membranes are moist. Posterior pharynx clear of any exudate or lesions.Normal dentition.  Neck: normal, supple, no masses, no thyromegaly Respiratory: clear to auscultation bilaterally, no wheezing, no crackles. Normal respiratory effort. No accessory muscle use.  Cardiovascular: Regular rate and rhythm, no murmurs / rubs / gallops. No extremity edema. 2+ pedal pulses. No carotid bruits.  Abdomen: Obese, no tenderness, no masses palpated. No hepatosplenomegaly. Bowel sounds positive.  Musculoskeletal: no clubbing / cyanosis. No joint deformity upper and lower extremities.  Tender with motion and decreased range of motion on the right, no contractures. Normal muscle tone.  Skin: no rashes, lesions, ulcers. No induration Neurologic: CN 2-12 grossly intact. Sensation intact, DTR  normal. Strength 5/5 in all 4.  Psychiatric: Normal judgment and insight. Alert and oriented x 3. Normal mood.     Labs on Admission: I have personally reviewed following labs and imaging studies  CBC: Recent Labs  Lab 12/27/19 2026  WBC 8.0  NEUTROABS 5.1  HGB 13.9  HCT 44.1  MCV 96.9  PLT 678   Basic Metabolic Panel: Recent Labs  Lab 12/27/19 2026  NA 140  K 4.0  CL 104  CO2 25  GLUCOSE 119*  BUN 11  CREATININE 0.82  CALCIUM 9.2   GFR: Estimated Creatinine Clearance: 222.8 mL/min (by C-G formula based on SCr of 0.82 mg/dL). Liver Function Tests: Recent Labs  Lab 12/27/19 2026  AST 32  ALT 42  ALKPHOS 67  BILITOT 0.4  PROT 7.6  ALBUMIN 3.7   No results for input(s):  LIPASE, AMYLASE in the last 168 hours. No results for input(s): AMMONIA in the last 168 hours. Coagulation Profile: No results for input(s): INR, PROTIME in the last 168 hours. Cardiac Enzymes: No results for input(s): CKTOTAL, CKMB, CKMBINDEX, TROPONINI in the last 168 hours. BNP (last 3 results) No results for input(s): PROBNP in the last 8760 hours. HbA1C: No results for input(s): HGBA1C in the last 72 hours. CBG: No results for input(s): GLUCAP in the last 168 hours. Lipid Profile: No results for input(s): CHOL, HDL, LDLCALC, TRIG, CHOLHDL, LDLDIRECT in the last 72 hours. Thyroid Function Tests: No results for input(s): TSH, T4TOTAL, FREET4, T3FREE, THYROIDAB in the last 72 hours. Anemia Panel: No results for input(s): VITAMINB12, FOLATE, FERRITIN, TIBC, IRON, RETICCTPCT in the last 72 hours. Urine analysis:    Component Value Date/Time   COLORURINE YELLOW 12/27/2019 2026   APPEARANCEUR CLEAR 12/27/2019 2026   LABSPEC 1.025 12/27/2019 2026   PHURINE 5.0 12/27/2019 2026   GLUCOSEU NEGATIVE 12/27/2019 2026   HGBUR NEGATIVE 12/27/2019 2026   BILIRUBINUR NEGATIVE 12/27/2019 2026   BILIRUBINUR Neg 12/18/2019 1728   KETONESUR NEGATIVE 12/27/2019 2026   PROTEINUR NEGATIVE 12/27/2019  2026   UROBILINOGEN 0.2 12/18/2019 1728   UROBILINOGEN 0.2 05/04/2010 1412   NITRITE NEGATIVE 12/27/2019 2026   LEUKOCYTESUR TRACE (A) 12/27/2019 2026   Sepsis Labs: @LABRCNTIP (procalcitonin:4,lacticidven:4) )No results found for this or any previous visit (from the past 240 hour(s)).   Radiological Exams on Admission: DG Knee 2 Views Right  Result Date: 12/27/2019 CLINICAL DATA:  Right hip pain for 6 months. EXAM: RIGHT KNEE - 1-2 VIEW COMPARISON:  10/14/2013 FINDINGS: AP and lateral views of the right knee without signs of fracture or dislocation. Tricompartmental degenerative changes. Possible small joint effusion. Suggestion of edema about the calf. IMPRESSION: Mild tricompartmental degenerative changes with possible small joint effusion. Question lower extremity edema. Electronically Signed   By: 10/16/2013 M.D.   On: 12/27/2019 16:34   CT Hip Right Wo Contrast  Result Date: 12/27/2019 CLINICAL DATA:  Hip trauma, fracture suspected. Right leg pain for 6 months, with inability to bear weight for 2 days. EXAM: CT OF THE RIGHT HIP WITHOUT CONTRAST TECHNIQUE: Multidetector CT imaging of the right hip was performed according to the standard protocol. Multiplanar CT image reconstructions were also generated. COMPARISON:  Radiographs 12/27/2019 FINDINGS: Bones/Joint/Cartilage Severe right hip osteoarthritis with joint space narrowing, osteophytes, subchondral sclerosis and cyst formation. No evidence of acute fracture or dislocation. No large joint effusion seen. Additional degenerative changes are present within the lower lumbar spine and right sacroiliac joint. Ligaments Suboptimally assessed by CT. Muscles and Tendons No focal muscular abnormality. Soft tissues No focal periarticular hematoma or other fluid collection. The visualized internal pelvic contents are unremarkable. IMPRESSION: Severe right hip osteoarthritis. No evidence of acute fracture or dislocation. Electronically Signed   By:  02/26/2020 M.D.   On: 12/27/2019 18:57   DG Hip Unilat With Pelvis 2-3 Views Right  Result Date: 12/27/2019 CLINICAL DATA:  Fall. RIGHT hip pain for 6 months. EXAM: DG HIP (WITH OR WITHOUT PELVIS) 2-3V RIGHT COMPARISON:  None. FINDINGS: There are significant degenerative changes in the RIGHT hip. The LATERAL view is nondiagnostic secondary to technique/patient body habitus. Although no displaced fracture is identified, minimally displaced fracture would be difficult to exclude given the technique. Degenerative changes are seen in the LEFT hip, to a lesser degree. Visualized portion of the pelvis is unremarkable. IMPRESSION: 1. Significant degenerative changes in the RIGHT hip. 2.  No evidence for acute abnormality. 3. Consider further imaging if there is strong clinical concern for fracture. Electronically Signed   By: Norva Pavlov M.D.   On: 12/27/2019 16:32    EKG: Independently reviewed.  Normal sinus rhythm.  No significant ST changes  Assessment/Plan Principal Problem:   Fall Active Problems:   Morbid obesity (HCC)   HTN (hypertension)   Hypertriglyceridemia   Severe obstructive sleep apnea   Unilateral osteoarthritis of hip, right     #1 status post fall: Mechanical in nature.  May have been worsened due to patient's osteoarthritis.  Patient will be admitted and initiated on physical therapy once his pain is controlled.  Also when orthopedics recommendation is obtained.  If no improvement may require skilled nursing placement.  #2 right hip osteoarthritis with decreased mobility: Secondary to progressive worsening of his osteoarthritis.  Probably made worse by his body habitus with high BMI.  Will defer to orthopedics for evaluation and treatment.  Currently being considered for possible steroid injections.  #3 morbid obesity: Dietary counseling.  #4 obstructive sleep apnea: On CPAP at home.  Resume  #5 hypertension: Resume home regimen.   DVT prophylaxis: Lovenox Code  Status: Full code Family Communication: Wife at bedside Disposition Plan: To be determined Consults called: Dr. Truddie Crumble orthopedics Admission status: Inpatient  Severity of Illness: The appropriate patient status for this patient is INPATIENT. Inpatient status is judged to be reasonable and necessary in order to provide the required intensity of service to ensure the patient's safety. The patient's presenting symptoms, physical exam findings, and initial radiographic and laboratory data in the context of their chronic comorbidities is felt to place them at high risk for further clinical deterioration. Furthermore, it is not anticipated that the patient will be medically stable for discharge from the hospital within 2 midnights of admission. The following factors support the patient status of inpatient.   " The patient's presenting symptoms include fall and pain in the right hip. " The worrisome physical exam findings include decreased range of motion the right hip. " The initial radiographic and laboratory data are worrisome because of x-ray and CT showing severe osteoarthritis. " The chronic co-morbidities include morbid obesity and hypertension.   * I certify that at the point of admission it is my clinical judgment that the patient will require inpatient hospital care spanning beyond 2 midnights from the point of admission due to high intensity of service, high risk for further deterioration and high frequency of surveillance required.Lonia Blood MD [facility] Pager [phone]  If 7PM-7AM, please contact night-coverage [email or web address] Password [facility]  12/27/2019, 9:43 PM

## 2019-12-27 NOTE — ED Notes (Signed)
Pt hollering out in pain, want to be rotated off his hip. Pt given pain medication as staff members assisted pt in rotating off of right hip. Pt verbalized he felt more comfortable

## 2019-12-27 NOTE — ED Triage Notes (Signed)
Pt arrives via EMS with complaints of right leg pain X6 months. Last 2 days pt unable to bare any weight. Strong Pulse noted in foot during triage.  154/115 P: 92 98% RA 18 RR

## 2019-12-27 NOTE — ED Notes (Signed)
Attempted report 

## 2019-12-28 LAB — COMPREHENSIVE METABOLIC PANEL
ALT: 40 U/L (ref 0–44)
AST: 27 U/L (ref 15–41)
Albumin: 3.4 g/dL — ABNORMAL LOW (ref 3.5–5.0)
Alkaline Phosphatase: 67 U/L (ref 38–126)
Anion gap: 9 (ref 5–15)
BUN: 11 mg/dL (ref 6–20)
CO2: 28 mmol/L (ref 22–32)
Calcium: 8.9 mg/dL (ref 8.9–10.3)
Chloride: 102 mmol/L (ref 98–111)
Creatinine, Ser: 0.78 mg/dL (ref 0.61–1.24)
GFR calc Af Amer: >60 mL/min (ref >60)
GFR calc non Af Amer: >60 mL/min (ref >60)
Glucose, Bld: 106 mg/dL — ABNORMAL HIGH (ref 70–99)
Potassium: 3.8 mmol/L (ref 3.5–5.1)
Sodium: 139 mmol/L (ref 135–145)
Total Bilirubin: 0.7 mg/dL (ref 0.3–1.2)
Total Protein: 6.9 g/dL (ref 6.5–8.1)

## 2019-12-28 LAB — CBC
HCT: 42.2 % (ref 39.0–52.0)
Hemoglobin: 13.2 g/dL (ref 13.0–17.0)
MCH: 30.3 pg (ref 26.0–34.0)
MCHC: 31.3 g/dL (ref 30.0–36.0)
MCV: 97 fL (ref 80.0–100.0)
Platelets: 210 10*3/uL (ref 150–400)
RBC: 4.35 MIL/uL (ref 4.22–5.81)
RDW: 13.2 % (ref 11.5–15.5)
WBC: 7.6 10*3/uL (ref 4.0–10.5)
nRBC: 0 % (ref 0.0–0.2)

## 2019-12-28 LAB — SARS CORONAVIRUS 2 (TAT 6-24 HRS): SARS Coronavirus 2: NEGATIVE

## 2019-12-28 LAB — HIV ANTIBODY (ROUTINE TESTING W REFLEX): HIV Screen 4th Generation wRfx: NONREACTIVE

## 2019-12-28 MED ORDER — KETOROLAC TROMETHAMINE 30 MG/ML IJ SOLN
30.0000 mg | Freq: Four times a day (QID) | INTRAMUSCULAR | Status: AC
  Administered 2019-12-28 – 2019-12-29 (×5): 30 mg via INTRAVENOUS
  Filled 2019-12-28 (×5): qty 1

## 2019-12-28 NOTE — Progress Notes (Addendum)
Team requesting a right hip steroid injection. Due to patient habitus procedure will need to be performed in IR.  Case has been reviewed and procedure approved by Dr. Deanne Coffer.  Patient tentatively scheduled for 4.12.21.  Team instructed to: Hold prophylactic anticoagulation 24 hours prior to scheduled procedure.   IR will call patient when ready.  Risks and benefits discussed with the patient including bleeding, infection, damage to adjacent structures,   All of the patient's questions were answered, patient is agreeable to proceed.  Consent signed and in chart.

## 2019-12-28 NOTE — Evaluation (Signed)
Occupational Therapy Evaluation Patient Details Name: Michael Payne MRN: [id number] DOB: Sep 06, 1971 Today's Date: 12/28/2019    History of Present Illness Michael Payne is a 49 y.o. male with medical history significant of morbid obesity, hypertension, obstructive sleep apnea, severe osteoarthritis of the right hip who apparently had been trying to get to orthopedics and scheduled for outpatient visit due to severe right hip osteoarthritis.  Patient sustained a fall about a week ago and since then has been unable to walk on his right lower extremity. CT and x-ray positive for advanced OA R hip but no fx.    Clinical Impression   Pt admitted with the above diagnoses and presents with below problem list. Pt will benefit from continued acute OT to address the below listed deficits and maximize independence with basic ADLs prior to d/c to venue below. At baseline, pt is independent to mod I with basic ADLs. Pt very motivated to work with OT/PT. Completed 3x sit<>stands with min A +2. Pt is currently min A with UB ADLs, mod A +2 with LB ADLs. Currently pt will need dependent lift equipment for OOB transfer. Sat EOB to eat lunch then returned to supine with min A +2.      Follow Up Recommendations  SNF    Equipment Recommendations  Other (comment)(defer to next venue)    Recommendations for Other Services       Precautions / Restrictions Precautions Precautions: Fall Restrictions Weight Bearing Restrictions: No      Mobility Bed Mobility Overal bed mobility: Needs Assistance Bed Mobility: Supine to Sit;Sit to Supine     Supine to sit: Mod assist;HOB elevated;+2 for safety/equipment;+2 for physical assistance Sit to supine: Mod assist;+2 for safety/equipment   General bed mobility comments: some assist to advance BLE across mattress. assist to powerup trunk. Pt on air mattress which created a bit of a roadblock in bed mobility "I'm stuck in a hole."  Transfers Overall transfer  level: Needs assistance Equipment used: Rolling walker (2 wheeled) Transfers: Sit to/from Stand Sit to Stand: Min assist;+2 physical assistance         General transfer comment: to/from elevated bed height. assist to stabilize rw and to steady.     Balance Overall balance assessment: Needs assistance Sitting-balance support: Bilateral upper extremity supported;Feet supported Sitting balance-Leahy Scale: Poor Sitting balance - Comments: difficulty initially with sitting EOB due to nature of air mattress "I'm stuck in a hole." Initially needing mod to max A but progressed to close min guard once mattress at the right amount of firmness/support.    Standing balance support: Bilateral upper extremity supported Standing balance-Leahy Scale: Poor Standing balance comment: briefly stood 3x with min A +2 and rw.                            ADL either performed or assessed with clinical judgement   ADL Overall ADL's : Needs assistance/impaired Eating/Feeding: Set up;Sitting;Supervision/ safety Eating/Feeding Details (indicate cue type and reason): sat EOB long enough to eat lunch then fatigued and needing to return to supine Grooming: Sitting;Min guard   Upper Body Bathing: Minimal assistance;Sitting   Lower Body Bathing: Sitting/lateral leans;Sit to/from stand;Moderate assistance;+2 for safety/equipment   Upper Body Dressing : Minimal assistance;Sitting   Lower Body Dressing: Moderate assistance;+2 for safety/equipment;Sit to/from stand;Sitting/lateral leans                 General ADL Comments: Pt completed bed mobility and  3x sit<>stands. Pt really wanting to transfer to chair but ultimately limited by R hip pain, fatigue, generalized weakness, and decreased activity tolerance     Vision         Perception     Praxis      Pertinent Vitals/Pain Pain Assessment: Faces Faces Pain Scale: Hurts even more Pain Location: R hip Pain Descriptors / Indicators:  Grimacing;Constant Pain Intervention(s): Limited activity within patient's tolerance;Monitored during session;Repositioned     Hand Dominance     Extremity/Trunk Assessment Upper Extremity Assessment Upper Extremity Assessment: Generalized weakness;Overall [facility] for tasks assessed   Lower Extremity Assessment Lower Extremity Assessment: Defer to PT evaluation   Cervical / Trunk Assessment Cervical / Trunk Assessment: (large body habitus)   Communication Communication Communication: No difficulties   Cognition Arousal/Alertness: Awake/alert Behavior During Therapy: WFL for tasks assessed/performed Overall Cognitive Status: Within Functional Limits for tasks assessed                                     General Comments       Exercises     Shoulder Instructions      Home Living Family/patient expects to be discharged to:: Private residence Living Arrangements: Spouse/significant other;Children Available Help at Discharge: Family Type of Home: House Home Access: Stairs to enter Secretary/administrator of Steps: 4 Entrance Stairs-Rails: Left("it's loose") Home Layout: One level     Bathroom Shower/Tub: Walk-in shower;Tub/shower unit   Bathroom Toilet: Standard                Prior Functioning/Environment Level of Independence: Independent;Independent with assistive device(s)        Comments: recently started using cane vs walker due to falls        OT Problem List: Decreased strength;Decreased activity tolerance;Impaired balance (sitting and/or standing);Decreased knowledge of use of DME or AE;Decreased knowledge of precautions;Obesity;Pain      OT Treatment/Interventions: Self-care/ADL training;Therapeutic exercise;Energy conservation;DME and/or AE instruction;Therapeutic activities;Patient/family education;Balance training    OT Goals(Current goals can be found in the care plan section) Acute Rehab OT Goals Patient Stated Goal: get out of  bed and sit up in the chair OT Goal Formulation: With patient/family Time For Goal Achievement: 01/11/20 Potential to Achieve Goals: Good ADL Goals Pt Will Perform Grooming: with set-up;sitting Pt Will Perform Upper Body Bathing: with set-up;sitting Pt Will Perform Lower Body Bathing: sit to/from stand;with mod assist Pt Will Transfer to Toilet: with mod assist;ambulating;stand pivot transfer Pt Will Perform Toileting - Clothing Manipulation and hygiene: with modified independence;sitting/lateral leans;sit to/from stand Pt/caregiver will Perform Home Exercise Program: Increased strength;Both right and left upper extremity;With theraband;With written HEP provided;Independently Additional ADL Goal #1: Pt will complete bed mobility at min guard level to prepare for OOB/EOB ADLs  OT Frequency: Min 2X/week   Barriers to D/C:    currently needs +2 assist       Co-evaluation PT/OT/SLP Co-Evaluation/Treatment: Yes Reason for Co-Treatment: For patient/therapist safety;To address functional/ADL transfers   OT goals addressed during session: ADL's and self-care      AM-PAC OT "6 Clicks" Daily Activity     Outcome Measure Help from another person eating meals?: None Help from another person taking care of personal grooming?: A Little Help from another person toileting, which includes using toliet, bedpan, or urinal?: Total Help from another person bathing (including washing, rinsing, drying)?: Total Help from another person to put on and taking off regular  upper body clothing?: A Little Help from another person to put on and taking off regular lower body clothing?: A Lot 6 Click Score: 14   End of Session Equipment Utilized During Treatment: Rolling walker Nurse Communication: Mobility status;Need for lift equipment  Activity Tolerance: Patient tolerated treatment well;Patient limited by pain;Patient limited by fatigue Patient left: in bed;with call bell/phone within reach;with  family/visitor present  OT Visit Diagnosis: Unsteadiness on feet (R26.81);Other abnormalities of gait and mobility (R26.89);Muscle weakness (generalized) (M62.81);Pain                Time: 5146-0479 OT Time Calculation (min): 44 min Charges:  OT General Charges $OT Visit: 1 Visit OT Evaluation $OT Eval Moderate Complexity: 1 Mod  Raynald Kemp, OT Acute Rehabilitation Services Pager: [phone] Office: [phone]   Pilar Grammes 12/28/2019, 2:48 PM

## 2019-12-28 NOTE — Evaluation (Signed)
Physical Therapy Evaluation Patient Details Name: Michael Payne MRN: [id number] DOB: Aug 03, 1971 Today's Date: 12/28/2019   History of Present Illness  Michael Payne is a 49 y.o. male with medical history significant of morbid obesity, hypertension, obstructive sleep apnea, severe osteoarthritis of the right hip who apparently had been trying to get to orthopedics and scheduled for outpatient visit due to severe right hip osteoarthritis.  Patient sustained a fall about a week ago and since then has been unable to walk on his right lower extremity. CT and x-ray positive for advanced OA R hip but no fx.   Clinical Impression  Pt admitted with above diagnosis. Pt able to come to EOB with +2 min A. Posterior lean with initial sitting and pt limited by body habitus. Stood EOB 3x with RW and +2 assist. Pt really wanted to get to chair but unable to step feet and after 3 stands, LE's were shaking and very weak. Pt would be unable to get into home at this point and wife works. Recommend SNF at d/c, pt and wife agreeable. Pt currently with functional limitations due to the deficits listed below (see PT Problem List). Pt will benefit from skilled PT to increase their independence and safety with mobility to allow discharge to the venue listed below.       Follow Up Recommendations SNF;Supervision for mobility/OOB    Equipment Recommendations  None recommended by PT    Recommendations for Other Services       Precautions / Restrictions Precautions Precautions: Fall Precaution Comments: has fallen 3x Restrictions Weight Bearing Restrictions: No      Mobility  Bed Mobility Overal bed mobility: Needs Assistance Bed Mobility: Supine to Sit;Sit to Supine     Supine to sit: Mod assist;HOB elevated;+2 for safety/equipment;+2 for physical assistance Sit to supine: Mod assist;+2 for safety/equipment   General bed mobility comments: some assist to advance BLE across mattress. assist to powerup trunk.  Pt on air mattress which created a bit of a roadblock in bed mobility "I'm stuck in a hole."  Transfers Overall transfer level: Needs assistance Equipment used: Rolling walker (2 wheeled) Transfers: Sit to/from Stand Sit to Stand: Min assist;+2 physical assistance         General transfer comment: to/from elevated bed height. assist to stabilize rw and to steady. Pt really wanted to get into chair but after 3 sit<>stand with inability to step feet, pt's LE's both fatigued and unsafe to continue to chair  Ambulation/Gait             General Gait Details: unable  Stairs            Wheelchair Mobility    Modified Rankin (Stroke Patients Only)       Balance Overall balance assessment: Needs assistance Sitting-balance support: Bilateral upper extremity supported;Feet supported Sitting balance-Leahy Scale: Poor Sitting balance - Comments: difficulty initially with sitting EOB due to nature of air mattress "I'm stuck in a hole." Initially needing mod to max A but progressed to close min guard once mattress at the right amount of firmness/support.  Postural control: Posterior lean Standing balance support: Bilateral upper extremity supported Standing balance-Leahy Scale: Poor Standing balance comment: briefly stood 3x with min A +2 and rw.                              Pertinent Vitals/Pain Pain Assessment: Faces Faces Pain Scale: Hurts even more Pain Location: R  hip Pain Descriptors / Indicators: Grimacing;Constant Pain Intervention(s): Limited activity within patient's tolerance;Monitored during session;Premedicated before session    Home Living Family/patient expects to be discharged to:: Private residence Living Arrangements: Spouse/significant other;Children Available Help at Discharge: Family Type of Home: House Home Access: Stairs to enter Entrance Stairs-Rails: Left Entrance Stairs-Number of Steps: 4 Home Layout: One level Home Equipment:  Walker - 2 wheels Additional Comments: pt reports railing on home is loose. He has fallen 3x due to LE's giving out.     Prior Function Level of Independence: Independent with assistive device(s)         Comments: recently began using AD     Hand Dominance        Extremity/Trunk Assessment   Upper Extremity Assessment Upper Extremity Assessment: Defer to OT evaluation    Lower Extremity Assessment Lower Extremity Assessment: RLE deficits/detail RLE Deficits / Details: hip flex 2-/5, pt with limited knee ROM since falling in 6/20 and experiences knee buckling since that time. Quad weakness noted.  RLE: Unable to fully assess due to pain RLE Sensation: WNL RLE Coordination: WNL    Cervical / Trunk Assessment Cervical / Trunk Assessment: Other exceptions Cervical / Trunk Exceptions: morbidly obese  Communication   Communication: No difficulties  Cognition Arousal/Alertness: Awake/alert Behavior During Therapy: WFL for tasks assessed/performed Overall Cognitive Status: Within Functional Limits for tasks assessed                                        General Comments General comments (skin integrity, edema, etc.): discussed options with wife and she is agreeable to SNF and used to work at [employer] - Lower Extremity Ankle Circles/Pumps: AROM;Both;10 reps;Supine Quad Sets: AROM;Right;10 reps;Supine Gluteal Sets: AROM;10 reps;Supine   Assessment/Plan    PT Assessment Patient needs continued PT services  PT Problem List Decreased strength;Decreased activity tolerance;Decreased range of motion;Decreased balance;Decreased mobility;Decreased knowledge of use of DME;Decreased knowledge of precautions;Pain;Obesity       PT Treatment Interventions DME instruction;Gait training;Stair training;Functional mobility training;Therapeutic activities;Therapeutic exercise;Balance training;Neuromuscular re-education;Patient/family  education    PT Goals (Current goals can be found in the Care Plan section)  Acute Rehab PT Goals Patient Stated Goal: get out of bed and sit up in the chair PT Goal Formulation: With patient Time For Goal Achievement: 01/11/20 Potential to Achieve Goals: Good    Frequency Min 3X/week   Barriers to discharge Decreased caregiver support;Inaccessible home environment STE home and wife works, 2 small children at home    Co-evaluation PT/OT/SLP Co-Evaluation/Treatment: Yes Reason for Co-Treatment: For patient/therapist safety PT goals addressed during session: Mobility/safety with mobility;Balance;Proper use of DME OT goals addressed during session: ADL's and self-care       AM-PAC PT "6 Clicks" Mobility  Outcome Measure Help needed turning from your back to your side while in a flat bed without using bedrails?: None Help needed moving from lying on your back to sitting on the side of a flat bed without using bedrails?: A Little Help needed moving to and from a bed to a chair (including a wheelchair)?: A Lot Help needed standing up from a chair using your arms (e.g., wheelchair or bedside chair)?: A Lot Help needed to walk in hospital room?: Total Help needed climbing 3-5 steps with a railing? : Total 6 Click Score: 13    End of Session   Activity  Tolerance: Patient limited by pain Patient left: in bed;with call bell/phone within reach;with family/visitor present Nurse Communication: Mobility status PT Visit Diagnosis: Unsteadiness on feet (R26.81);Muscle weakness (generalized) (M62.81);Difficulty in walking, not elsewhere classified (R26.2);History of falling (Z91.81)    Time: 4967-5916 PT Time Calculation (min) (ACUTE ONLY): 54 min   Charges:   PT Evaluation $PT Eval Moderate Complexity: 1 Mod PT Treatments $Therapeutic Activity: 8-22 mins        Leighton Roach, [place]  Pager [phone] Office [place] 12/28/2019,  2:57 PM

## 2019-12-28 NOTE — Plan of Care (Signed)
  Problem: Education: Goal: Knowledge of General Education information will improve Description: Including pain rating scale, medication(s)/side effects and non-pharmacologic comfort measures Outcome: Progressing   Problem: Clinical Measurements: Goal: Ability to maintain clinical measurements within normal limits will improve Outcome: Progressing Goal: Will remain free from infection Outcome: Progressing Goal: Diagnostic test results will improve Outcome: Progressing Goal: Respiratory complications will improve Outcome: Progressing Goal: Cardiovascular complication will be avoided Outcome: Progressing   Problem: Nutrition: Goal: Adequate nutrition will be maintained Outcome: Progressing   Problem: Coping: Goal: Level of anxiety will decrease Outcome: Progressing   Problem: Elimination: Goal: Will not experience complications related to bowel motility Outcome: Progressing Goal: Will not experience complications related to urinary retention Outcome: Progressing   

## 2019-12-28 NOTE — Consult Note (Signed)
ORTHOPAEDIC CONSULTATION  REQUESTING PHYSICIAN: Lavina Hamman, MD  Chief Complaint: Fall, right hip pain, known severe hip OA  HPI: Michael Payne is a 49 y.o. male who complains of increased pain and decreased ability to ambulate after a fall 3 days ago.  He has known severe right hip arthritis.  He is severely morbidly obese which has prevented definitive treatment/hip replacement.  He has been in touch with [facility] bariatric team and was in discussions about surgery, but states he has not been in touch with them for a year or so due to Covid.  Prior to his fall 3 days ago he reports being able to ambulate with the aid of a walker or cane.  He presented to the emergency department where x-rays and CT scan showed no obvious acute osseous abnormality.  He has severe right hip O.A.   Past Medical History:  Diagnosis Date  . Anemia    borderline anemia, was instructed to eat more iron filled foods  . GERD (gastroesophageal reflux disease)    lactose intolerance  . HTN (hypertension)    Past Surgical History:  Procedure Laterality Date  . FOOT SURGERY    . KNEE ARTHROSCOPY WITH MEDIAL MENISECTOMY Left 12/16/2014   Procedure: KNEE ARTHROSCOPY WITH MEDIAL MENISECTOMY;  Surgeon: Renette Butters, MD;  Location: [facility];  Service: Orthopedics;  Laterality: Left;   Social History   Socioeconomic History  . Marital status: Married    Spouse name: Not on file  . Number of children: 2  . Years of education: Not on file  . Highest education level: Not on file  Occupational History  . Occupation: disabled  Tobacco Use  . Smoking status: Former Smoker    Packs/day: 2.00    Years: 22.00    Pack years: 44.00    Types: Cigarettes    Quit date: 02/24/2011    Years since quitting: 8.8  . Smokeless tobacco: Never Used  Substance and Sexual Activity  . Alcohol use: Yes    Comment: Weekends/beer-- has slowed down  alot  . Drug use: Not Currently    Types: Marijuana    Comment:  former -  quit in 2012  . Sexual activity: Yes    Partners: Female  Other Topics Concern  . Not on file  Social History Narrative  . Not on file   Social Determinants of Health   Financial Resource Strain:   . Difficulty of Paying Living Expenses:   Food Insecurity:   . Worried About Charity fundraiser in the Last Year:   . Arboriculturist in the Last Year:   Transportation Needs:   . Film/video editor (Medical):   Marland Kitchen Lack of Transportation (Non-Medical):   Physical Activity:   . Days of Exercise per Week:   . Minutes of Exercise per Session:   Stress:   . Feeling of Stress :   Social Connections:   . Frequency of Communication with Friends and Family:   . Frequency of Social Gatherings with Friends and Family:   . Attends Religious Services:   . Active Member of Clubs or Organizations:   . Attends Archivist Meetings:   Marland Kitchen Marital Status:    Family History  Problem Relation Age of Onset  . Heart attack Mother 58  . Lung cancer Father   . Cancer Father   . CAD Maternal Grandfather    Allergies  Allergen Reactions  . Aspirin Other (  See Comments)    Upset stomach  . Lactose Intolerance (Gi)    Prior to Admission medications   Medication Sig Start Date End Date Taking? Authorizing Provider  acetaminophen (TYLENOL) 500 MG tablet Take 1,000 mg by mouth daily as needed for moderate pain.   Yes [provider]  allopurinol (ZYLOPRIM) 300 MG tablet Take 1 tablet (300 mg total) by mouth 2 (two) times daily. 07/24/19  Yes Elby Beck, FNP  amLODipine (NORVASC) 10 MG tablet Take 1 tablet (10 mg total) by mouth daily. 07/24/19  Yes Elby Beck, FNP  celecoxib (CELEBREX) 200 MG capsule TAKE 1 CAPSULE BY MOUTH 2 TIMES DAILY AS NEEDED FOR MODERATE PAIN. Patient taking differently: Take 200 mg by mouth 2 (two) times daily as needed for moderate pain.  12/20/19  Yes Elby Beck, FNP  Cholecalciferol (VITAMIN D3 PO) Take 1 tablet by mouth  daily.   Yes [provider]  oxyCODONE-acetaminophen (PERCOCET) 10-325 MG tablet Take 1 tablet by mouth 4 (four) times daily as needed for pain.  09/25/19  Yes [provider]  tiZANidine (ZANAFLEX) 2 MG tablet Take 2 mg by mouth 3 (three) times daily as needed for muscle spasms.  07/24/18  Yes [provider]  COLCRYS 0.6 MG tablet TAKE 1 TABLET BY MOUTH DAILY AS NEEDED Patient not taking: Reported on 12/27/2019 11/08/18   Elby Beck, FNP   DG Knee 2 Views Right  Result Date: 12/27/2019 CLINICAL DATA:  Right hip pain for 6 months. EXAM: RIGHT KNEE - 1-2 VIEW COMPARISON:  10/14/2013 FINDINGS: AP and lateral views of the right knee without signs of fracture or dislocation. Tricompartmental degenerative changes. Possible small joint effusion. Suggestion of edema about the calf. IMPRESSION: Mild tricompartmental degenerative changes with possible small joint effusion. Question lower extremity edema. Electronically Signed   By: Zetta Bills M.D.   On: 12/27/2019 16:34   CT Hip Right Wo Contrast  Result Date: 12/27/2019 CLINICAL DATA:  Hip trauma, fracture suspected. Right leg pain for 6 months, with inability to bear weight for 2 days. EXAM: CT OF THE RIGHT HIP WITHOUT CONTRAST TECHNIQUE: Multidetector CT imaging of the right hip was performed according to the standard protocol. Multiplanar CT image reconstructions were also generated. COMPARISON:  Radiographs 12/27/2019 FINDINGS: Bones/Joint/Cartilage Severe right hip osteoarthritis with joint space narrowing, osteophytes, subchondral sclerosis and cyst formation. No evidence of acute fracture or dislocation. No large joint effusion seen. Additional degenerative changes are present within the lower lumbar spine and right sacroiliac joint. Ligaments Suboptimally assessed by CT. Muscles and Tendons No focal muscular abnormality. Soft tissues No focal periarticular hematoma or other fluid collection. The visualized internal pelvic  contents are unremarkable. IMPRESSION: Severe right hip osteoarthritis. No evidence of acute fracture or dislocation. Electronically Signed   By: Richardean Sale M.D.   On: 12/27/2019 18:57   DG Hip Unilat With Pelvis 2-3 Views Right  Result Date: 12/27/2019 CLINICAL DATA:  Fall. RIGHT hip pain for 6 months. EXAM: DG HIP (WITH OR WITHOUT PELVIS) 2-3V RIGHT COMPARISON:  None. FINDINGS: There are significant degenerative changes in the RIGHT hip. The LATERAL view is nondiagnostic secondary to technique/patient body habitus. Although no displaced fracture is identified, minimally displaced fracture would be difficult to exclude given the technique. Degenerative changes are seen in the LEFT hip, to a lesser degree. Visualized portion of the pelvis is unremarkable. IMPRESSION: 1. Significant degenerative changes in the RIGHT hip. 2. No evidence for acute abnormality. 3. Consider further  imaging if there is strong clinical concern for fracture. Electronically Signed   By: Nolon Nations M.D.   On: 12/27/2019 16:32    Positive ROS: All other systems have been reviewed and were otherwise negative with the exception of those mentioned in the HPI and as above.  Objective: Labs cbc Recent Labs    12/27/19 2026 12/28/19 0403  WBC 8.0 7.6  HGB 13.9 13.2  HCT 44.1 42.2  PLT 248 210    Labs inflam No results for input(s): CRP in the last 72 hours.  Invalid input(s): ESR  Labs coag No results for input(s): INR, PTT in the last 72 hours.  Invalid input(s): PT  Recent Labs    12/27/19 2026 12/28/19 0403  NA 140 139  K 4.0 3.8  CL 104 102  CO2 25 28  GLUCOSE 119* 106*  BUN 11 11  CREATININE 0.82 0.78  CALCIUM 9.2 8.9    Physical Exam: Vitals:   12/28/19 0233 12/28/19 0631  BP: (!) 145/85 (!) 142/98  Pulse: 82 79  Resp: 17 16  Temp: 97.8 F (36.6 C) 97.7 F (36.5 C)  SpO2: 98% 97%   General: Alert, no acute distress.  Supine in air bed.  Calm, conversant. Mental status: Alert  and Oriented x3 Neurologic: Speech Clear and organized, no gross focal findings or movement disorder appreciated. Respiratory: No cyanosis, no use of accessory musculature Cardiovascular: No pedal edema GI: Abdomen is protuberant soft and non-tender. Skin: Warm and dry.  No lesions in the area of chief complaint. Extremities: Warm and well perfused w/o edema Psychiatric: Patient is competent for consent with normal mood and affect  MUSCULOSKELETAL:  Pain with active motion right hip.  No pain to palpation at the knee.  Dorsiflexion, plantarflexion EHL, FHL intact distally.  Foot warm.  Sensation intact distally. Other extremities are atraumatic with painless ROM and NVI.  Assessment / Plan: Principal Problem:   Fall Active Problems:   Morbid obesity (HCC)   HTN (hypertension)   Hypertriglyceridemia   Severe obstructive sleep apnea   Unilateral osteoarthritis of hip, right   Severe right hip OA in a severely morbidly obese 49 year old male This is a difficult situation.  Definitive treatment/joint replacement placement not possible due to his body habitus.  I discussed IR guided right hip cortisone injection and he is amenable to this.  Also recommend PT/OT.    IR guided intra-articular right hip injection PT OT evaluations/mobilization Long term definitive treatment will depend on his success reducing his peri-operative risk factors - primarily weight loss. Please call with questions  Contact information:  Edmonia Lynch MD, Roxan Hockey PA-C  Prudencio Burly III PA-C 12/28/2019 6:53 AM

## 2019-12-28 NOTE — Plan of Care (Signed)

## 2019-12-28 NOTE — Progress Notes (Signed)
[facility] Progress Note  Patient: Michael Payne    XBD:[id number]  DOA: 12/27/2019     Date of Service: the patient was seen and examined on 12/28/2019  Chief complaint Hip pain.  Brief hospital course: Morbid obesity, HTN, OSA, osteoarthritis of right hip presents with severe right hip pain and a fall.  Work-up was negative for any acute fracture.  Orthopedic was consulted who felt that the patient does have severe osteoarthritis but currently limited in option given his morbid obesity for surgical intervention.  Recommended IR guided intervention for pain control which is currently scheduled on 12/30/2019. Patient lives at home with his wife does not have any resources to take care of himself and is at risk for frequent fall.  With his OSA at risk for respiratory suppression with opioids therefore currently unsafe to go home until pain is controlled with IR injection.  PT recommends SNF.  Currently further plan is continue pain control with Toradol and monitor response.  Assessment and Plan: #1 status post fall: Mechanical in nature.  May have been worsened due to patient's osteoarthritis.  Patient will be admitted and initiated on physical therapy once his pain is controlled.  Also when orthopedics recommendation is obtained.  If no improvement may require skilled nursing placement.  #2 right hip osteoarthritis with decreased mobility: Secondary to progressive worsening of his osteoarthritis.  Probably made worse by his body habitus with high BMI.  Will defer to orthopedics for evaluation and treatment.  Currently being considered for possible steroid injections.  #3 morbid obesity: Dietary counseling. Body mass index is 61 kg/m.   #4 obstructive sleep apnea: On CPAP at home.  Resume  #5 hypertension: Resume home regimen.  Diet: Cardiac diet DVT Prophylaxis: Subcutaneous Lovenox stop Lovenox 24 hours before intervention that is tomorrow  Advance goals of care discussion:  Full code  Family Communication: no family was present at bedside, at the time of interview.   Disposition:  Pt is from home, admitted with fall, still has uncontrolled hip pain and unsafe discharge plan, which precludes a safe discharge. Discharge to SNF, when medically stable.  Subjective: Reports pain 10 out of 10, in her right hip.  No nausea no vomiting.  Current pain control is not adequate.  No fever no chills.  Unable to sit or stand in the bed due to pain.  Physical Exam: General:  alert oriented to time, place, and person.  Appear in moderate distress, affect appropriate Eyes: PERRL ENT: Oral Mucosa Clear, moist  Neck: difficult to assess  JVD,  Cardiovascular: S1 and S2 Present, no Murmur,  Respiratory: good respiratory effort, Bilateral Air entry equal and Decreased, no Crackles, no wheezes Abdomen: Bowel Sound present, Soft and no tenderness,  Skin: no rash Extremities: no Pedal edema, no calf tenderness Neurologic: without any new focal findings Gait not checked due to patient safety concerns  Vitals:   12/27/19 2329 12/28/19 0233 12/28/19 0631 12/28/19 1700  BP: (!) 150/82 (!) 145/85 (!) 142/98 114/81  Pulse: 87 82 79 80  Resp: 18 17 16 17   Temp: 98 F (36.7 C) 97.8 F (36.6 C) 97.7 F (36.5 C) 98.2 F (36.8 C)  TempSrc: Oral Oral Oral Oral  SpO2: 98% 98% 97% 99%  Weight: (!) 227.3 kg     Height: 6\' 4"  (1.93 m)       Intake/Output Summary (Last 24 hours) at 12/28/2019 1923 Last data filed at 12/28/2019 1756 Gross per 24 hour  Intake 1369.49 ml  Output 1000 ml  Net 369.49 ml   Filed Weights   12/27/19 1432 12/27/19 2329  Weight: (!) 227.3 kg (!) 227.3 kg    Data Reviewed: I have personally reviewed and interpreted daily labs, tele strips, imagings as discussed above. I reviewed all nursing notes, pharmacy notes, vitals, pertinent old records I have discussed plan of care as described above with RN and patient/family.  CBC: Recent Labs  Lab  12/27/19 2026 12/28/19 0403  WBC 8.0 7.6  NEUTROABS 5.1  --   HGB 13.9 13.2  HCT 44.1 42.2  MCV 96.9 97.0  PLT 248 233   Basic Metabolic Panel: Recent Labs  Lab 12/27/19 2026 12/28/19 0403  NA 140 139  K 4.0 3.8  CL 104 102  CO2 25 28  GLUCOSE 119* 106*  BUN 11 11  CREATININE 0.82 0.78  CALCIUM 9.2 8.9    Studies: No results found.  Scheduled Meds: . allopurinol  300 mg Oral BID  . amLODipine  10 mg Oral Daily  . cholecalciferol  1,000 Units Oral Daily  . colchicine  0.6 mg Oral BID  . enoxaparin (LOVENOX) injection  110 mg Subcutaneous Daily  . ketorolac  30 mg Intravenous Q6H   Continuous Infusions: PRN Meds: morphine injection, ondansetron **OR** ondansetron (ZOFRAN) IV, oxyCODONE-acetaminophen **AND** oxyCODONE, tiZANidine  Time spent: 35 minutes  Author: Berle Mull, MD [facility] 12/28/2019 7:23 PM  To reach On-call, see care teams to locate the attending and reach out to them via [email or web address]. If 7PM-7AM, please contact night-coverage If you still have difficulty reaching the attending provider, please page the [facility] (Director on Call) for [facility] on amion for assistance.

## 2019-12-29 ENCOUNTER — Encounter (HOSPITAL_COMMUNITY): Payer: Self-pay | Admitting: Internal Medicine

## 2019-12-29 MED ORDER — POLYETHYLENE GLYCOL 3350 17 G PO PACK
17.0000 g | PACK | Freq: Every day | ORAL | Status: DC
  Administered 2019-12-29: 17 g via ORAL
  Filled 2019-12-29 (×5): qty 1

## 2019-12-29 MED ORDER — SENNOSIDES-DOCUSATE SODIUM 8.6-50 MG PO TABS
1.0000 | ORAL_TABLET | Freq: Two times a day (BID) | ORAL | Status: DC
  Administered 2019-12-29 – 2020-01-02 (×3): 1 via ORAL
  Filled 2019-12-29 (×10): qty 1

## 2019-12-29 MED ORDER — ACETAMINOPHEN 325 MG PO TABS
650.0000 mg | ORAL_TABLET | Freq: Four times a day (QID) | ORAL | Status: DC | PRN
  Administered 2020-01-02 – 2020-01-03 (×2): 650 mg via ORAL
  Filled 2019-12-29 (×3): qty 2

## 2019-12-29 NOTE — Progress Notes (Signed)
     ORTHOPAEDIC CONSULTATION  REQUESTING PHYSICIAN: Rolly Salter, MD  Chief Complaint: Fall, right hip pain, known severe hip OA  S: Pain a little better today.  IR Guided injection planned for tomorrow.     Objective: Labs cbc Recent Labs    12/27/19 2026 12/28/19 0403  WBC 8.0 7.6  HGB 13.9 13.2  HCT 44.1 42.2  PLT 248 210    Recent Labs    12/27/19 2026 12/28/19 0403  NA 140 139  K 4.0 3.8  CL 104 102  CO2 25 28  GLUCOSE 119* 106*  BUN 11 11  CREATININE 0.82 0.78  CALCIUM 9.2 8.9    Physical Exam: Vitals:   12/28/19 2054 12/29/19 0545  BP: 124/71 137/78  Pulse: 77 67  Resp: 16 18  Temp: 98 F (36.7 C) 97.8 F (36.6 C)  SpO2: 98% 99%   General: Alert, no acute distress.  Supine in air bed.  Calm, conversant.  Wife at bedside.  MUSCULOSKELETAL:  Pain with active motion right hip. Dorsiflexion, plantarflexion EHL, FHL intact distally.  Foot warm.   Other extremities are atraumatic with painless ROM and NVI.  Assessment / Plan: Principal Problem:   Fall Active Problems:   Morbid obesity (HCC)   HTN (hypertension)   Hypertriglyceridemia   Severe obstructive sleep apnea   Unilateral osteoarthritis of hip, right   Severe right hip OA in a severely morbidly obese 49 year old male This is a difficult situation.  Definitive treatment/joint replacement placement not possible due to his body habitus.    IR guided intra-articular right hip injection  PT OT evaluations/mobilization  Long term definitive treatment will depend on his success reducing his peri-operative risk factors - primarily weight loss.  He will work to re-establish connection to [facility].  Short term dispo will depend on ability to mobilize after injection.  May need SNF.  Please call with questions  Contact information:  Margarita Rana MD, Aquilla Hacker PA-C  Albina Billet III PA-C 12/29/2019 3:04 PM

## 2019-12-29 NOTE — Progress Notes (Signed)
[facility] Progress Note  Patient: Michael Payne    KDX:[id number]  DOA: 12/27/2019     Date of Service: the patient was seen and examined on 12/29/2019  Chief complaint Hip pain.  Brief hospital course: Morbid obesity, HTN, OSA, osteoarthritis of right hip presents with severe right hip pain and a fall.  Work-up was negative for any acute fracture.  Orthopedic was consulted who felt that the patient does have severe osteoarthritis but currently limited in option given his morbid obesity for surgical intervention.  Recommended IR guided intervention for pain control which is currently scheduled on 12/30/2019. Patient lives at home with his wife does not have any resources to take care of himself and is at risk for frequent fall.  With his OSA at risk for respiratory suppression with opioids therefore currently unsafe to go home until pain is controlled with IR injection.  PT recommends SNF.  Currently further plan is continue pain control with Toradol and monitor response.  Assessment and Plan: 1 status post fall: Mechanical in nature.   May have been worsened due to patient's osteoarthritis.   Also when orthopedics recommendation is obtained.   If no improvement may require skilled nursing placement.  2 right hip osteoarthritis with decreased mobility:  Secondary to progressive worsening of his osteoarthritis.   Probably made worse by his body habitus with high BMI.   Will defer to orthopedics for evaluation and treatment.   Currently being considered for possible steroid injections.  3 morbid obesity:  Dietary counseling. Body mass index is 61 kg/m.   4 obstructive sleep apnea:  On CPAP at home.  Resume  5 hypertension:  Resume home regimen  Diet: Cardiac diet DVT Prophylaxis: SCD  Advance goals of care discussion: Full code  Family Communication: no family was present at bedside, at the time of interview.   Disposition:  Pt is from home, admitted with fall,  still has uncontrolled hip pain and unsafe discharge plan, which precludes a safe discharge. Discharge to SNF, when medically stable.  Subjective: Reports intermittent pain. No nausea no vomiting. No fever no chills. Unable to ambulate at all secondary to pain.  Physical Exam: General:  alert oriented to time, place, and person.  Appear in moderate distress, affect appropriate Eyes: PERRL ENT: Oral Mucosa Clear, moist  Neck: difficult to assess  JVD,  Cardiovascular: S1 and S2 Present, no Murmur,  Respiratory: good respiratory effort, Bilateral Air entry equal and Decreased, no Crackles, no wheezes Abdomen: Bowel Sound present, Soft and no tenderness,  Skin: no rash Extremities: no Pedal edema, no calf tenderness Neurologic: without any new focal findings Gait not checked due to patient safety concerns  Vitals:   12/28/19 0631 12/28/19 1700 12/28/19 2054 12/29/19 0545  BP: (!) 142/98 114/81 124/71 137/78  Pulse: 79 80 77 67  Resp: 16 17 16 18   Temp: 97.7 F (36.5 C) 98.2 F (36.8 C) 98 F (36.7 C) 97.8 F (36.6 C)  TempSrc: Oral Oral Oral Oral  SpO2: 97% 99% 98% 99%  Weight:      Height:        Intake/Output Summary (Last 24 hours) at 12/29/2019 1814 Last data filed at 12/29/2019 1620 Gross per 24 hour  Intake 1180 ml  Output 1600 ml  Net -420 ml   Filed Weights   12/27/19 1432 12/27/19 2329  Weight: (!) 227.3 kg (!) 227.3 kg    Data Reviewed: I have personally reviewed and interpreted daily labs, tele strips, imagings as  discussed above. I reviewed all nursing notes, pharmacy notes, vitals, pertinent old records I have discussed plan of care as described above with RN and patient/family.  CBC: Recent Labs  Lab 12/27/19 2026 12/28/19 0403  WBC 8.0 7.6  NEUTROABS 5.1  --   HGB 13.9 13.2  HCT 44.1 42.2  MCV 96.9 97.0  PLT 248 681   Basic Metabolic Panel: Recent Labs  Lab 12/27/19 2026 12/28/19 0403  NA 140 139  K 4.0 3.8  CL 104 102  CO2 25 28    GLUCOSE 119* 106*  BUN 11 11  CREATININE 0.82 0.78  CALCIUM 9.2 8.9    Studies: No results found.  Scheduled Meds: . allopurinol  300 mg Oral BID  . amLODipine  10 mg Oral Daily  . cholecalciferol  1,000 Units Oral Daily  . colchicine  0.6 mg Oral BID  . polyethylene glycol  17 g Oral Daily  . senna-docusate  1 tablet Oral BID   Continuous Infusions: PRN Meds: acetaminophen, morphine injection, ondansetron **OR** ondansetron (ZOFRAN) IV, oxyCODONE-acetaminophen **AND** oxyCODONE, tiZANidine  Time spent: 35 minutes  Author: Berle Mull, MD [facility] 12/29/2019 6:14 PM  To reach On-call, see care teams to locate the attending and reach out to them via [email or web address]. If 7PM-7AM, please contact night-coverage If you still have difficulty reaching the attending provider, please page the [facility] (Director on Call) for [facility] on amion for assistance.

## 2019-12-29 NOTE — Progress Notes (Signed)
Pt states he does not want to wear CPAP tonight. I encouraged pt if he changes his mind to call and I would help assist with placing the CPAP on.

## 2019-12-30 ENCOUNTER — Inpatient Hospital Stay (HOSPITAL_COMMUNITY): Payer: Medicare HMO

## 2019-12-30 LAB — BASIC METABOLIC PANEL
Anion gap: 12 (ref 5–15)
BUN: 12 mg/dL (ref 6–20)
CO2: 26 mmol/L (ref 22–32)
Calcium: 8.9 mg/dL (ref 8.9–10.3)
Chloride: 102 mmol/L (ref 98–111)
Creatinine, Ser: 0.79 mg/dL (ref 0.61–1.24)
GFR calc Af Amer: >60 mL/min (ref >60)
GFR calc non Af Amer: >60 mL/min (ref >60)
Glucose, Bld: 101 mg/dL — ABNORMAL HIGH (ref 70–99)
Potassium: 4.4 mmol/L (ref 3.5–5.1)
Sodium: 140 mmol/L (ref 135–145)

## 2019-12-30 MED ORDER — METHYLPREDNISOLONE ACETATE 80 MG/ML IJ SUSP
INTRAMUSCULAR | Status: AC
  Filled 2019-12-30: qty 1

## 2019-12-30 MED ORDER — KETOROLAC TROMETHAMINE 30 MG/ML IJ SOLN
30.0000 mg | Freq: Four times a day (QID) | INTRAMUSCULAR | Status: DC
  Administered 2019-12-30 – 2020-01-03 (×15): 30 mg via INTRAVENOUS
  Filled 2019-12-30 (×15): qty 1

## 2019-12-30 MED ORDER — BUPIVACAINE HCL (PF) 0.5 % IJ SOLN
INTRAMUSCULAR | Status: AC
  Filled 2019-12-30: qty 30

## 2019-12-30 MED ORDER — LIDOCAINE HCL 1 % IJ SOLN
INTRAMUSCULAR | Status: AC
  Filled 2019-12-30: qty 20

## 2019-12-30 MED ORDER — METHYLPREDNISOLONE ACETATE 40 MG/ML IJ SUSP
INTRAMUSCULAR | Status: AC
  Filled 2019-12-30: qty 1

## 2019-12-30 NOTE — Progress Notes (Signed)
Physical Therapy Treatment Patient Details Name: Michael Payne MRN: [id number] DOB: 1970/11/22 Today's Date: 12/30/2019    History of Present Illness Michael Payne is a 49 y.o. Payne with medical history significant of morbid obesity, hypertension, obstructive sleep apnea, severe osteoarthritis of the right hip who apparently had been trying to get to orthopedics and scheduled for outpatient visit due to severe right hip osteoarthritis.  Patient sustained a fall about a week ago and since then has been unable to walk on his right lower extremity. CT and x-ray positive for advanced OA R hip but no fx.     PT Comments    Pt improving slowly. Tolerated maximove lift to recliner yesterday and today was able to pivot to recliner from bed with RW and +2 mod A. Took 4 standing attempts before pt able to shift wt to L to get into chair and was unable to step feet in standing due to pain and weakness. Continue to recommend SNF as pt will be unable to get up 3 steps into home or mobilize within home at this point. PT will continue to follow.    Follow Up Recommendations  SNF;Supervision for mobility/OOB     Equipment Recommendations  Other (comment)(bariatric RW)    Recommendations for Other Services       Precautions / Restrictions Precautions Precautions: Fall Precaution Comments: fell 3x PTA Restrictions Weight Bearing Restrictions: No    Mobility  Bed Mobility Overal bed mobility: Needs Assistance Bed Mobility: Supine to Sit     Supine to sit: Mod assist;+2 for safety/equipment;+2 for physical assistance     General bed mobility comments: pt able to move self laterally in bed and get LE's off bed without assist. Mod HHA and from behind for sitting straight up.   Transfers Overall transfer level: Needs assistance Equipment used: Rolling walker (2 wheeled) Transfers: Sit to/from [facility] Sit to Stand: +2 physical assistance;Mod assist;+2 safety/equipment Stand  pivot transfers: Mod assist;+2 physical assistance;+2 safety/equipment       General transfer comment: pt stood from elevated bed 4x in preparation for transfer to chair. Transferred L with use of RW, mod A to stabilize it. Decreased control with sit to chair.   Ambulation/Gait             General Gait Details: unable due to pain and LE weakness   Stairs             Wheelchair Mobility    Modified Rankin (Stroke Patients Only)       Balance Overall balance assessment: Needs assistance Sitting-balance support: Bilateral upper extremity supported;Feet supported Sitting balance-Leahy Scale: Fair Sitting balance - Comments: able to wt shift fwd today and sit EOB with supervision Postural control: Posterior lean Standing balance support: Bilateral upper extremity supported Standing balance-Leahy Scale: Poor Standing balance comment: UE support and external assist needed to achieve and maintain standing                            Cognition Arousal/Alertness: Awake/alert Behavior During Therapy: WFL for tasks assessed/performed Overall Cognitive Status: Within Functional Limits for tasks assessed                                 General Comments: pt very frustrated this morning because he was taken down to CT for injection but had to use bathroom and procedure was aborted  and now he is in increased pain from ordeal and hasn't had injection      Exercises General Exercises - Lower Extremity Ankle Circles/Pumps: AROM;Both;10 reps;Seated Quad Sets: AROM;Right;10 reps;Seated Gluteal Sets: AROM;10 reps;Seated Long Arc Quad: AROM;Right;10 reps;Seated    General Comments General comments (skin integrity, edema, etc.): lift to be used for return to bed      Pertinent Vitals/Pain Pain Assessment: Faces Faces Pain Scale: Hurts whole lot Pain Location: R hip Pain Descriptors / Indicators: Grimacing;Constant Pain Intervention(s): Limited  activity within patient's tolerance;Monitored during session;Premedicated before session    Home Living                      Prior Function            PT Goals (current goals can now be found in the care plan section) Acute Rehab PT Goals Patient Stated Goal: get out of bed and sit up in the chair PT Goal Formulation: With patient Time For Goal Achievement: 01/11/20 Potential to Achieve Goals: Good Progress towards PT goals: Progressing toward goals    Frequency    Min 3X/week      PT Plan Current plan remains appropriate    Co-evaluation              AM-PAC PT "6 Clicks" Mobility   Outcome Measure  Help needed turning from your back to your side while in a flat bed without using bedrails?: None Help needed moving from lying on your back to sitting on the side of a flat bed without using bedrails?: A Little Help needed moving to and from a bed to a chair (including a wheelchair)?: A Lot Help needed standing up from a chair using your arms (e.g., wheelchair or bedside chair)?: A Lot Help needed to walk in hospital room?: Total Help needed climbing 3-5 steps with a railing? : Total 6 Click Score: 13    End of Session   Activity Tolerance: Patient limited by pain Patient left: with call bell/phone within reach;with family/visitor present;in chair Nurse Communication: Mobility status;Need for lift equipment PT Visit Diagnosis: Unsteadiness on feet (R26.81);Muscle weakness (generalized) (M62.81);Difficulty in walking, not elsewhere classified (R26.2);History of falling (Z91.81)     Time: 7654-6503 PT Time Calculation (min) (ACUTE ONLY): 33 min  Charges:  $Therapeutic Activity: 23-37 mins                     Lyanne Co, PT  Acute Rehab Services  Pager [phone] Office [phone]    Michael Payne 12/30/2019, 1:23 PM

## 2019-12-30 NOTE — TOC Initial Note (Addendum)
Transition of Care [facility]) - Initial/Assessment Note    Patient Details  Name: Michael Payne MRN: [id number] Date of Birth: 1971-08-13  Transition of Care [facility]) CM/SW Contact:    Marilu Favre, RN Phone Number: 12/30/2019, 11:36 AM  Clinical Narrative:   Update [facility] offered a bed however they did not realize patient's weight , their weight limit is 375 pounds. [facility] also offered a bed however they did not realize patient's weight their limit is 450 pounds. NCM discussed with patient. NCM will extend bed search beyond [place]. Patient will discuss with his wife.  [place] with [facility] returned call, their DON will be in office tomorrow and can confirm if they can accept or not .  [facility] and [facility] weight limit is 400 pounds                  Patient from home with wife and children ( 9 and 48 year old).   Wife had back surgery a year ago. Patient has been using wife's walker which is to small for him. Patient has an elevated commode seat at home but "it does not work".   Patient has CPAP at home.   Patient wanting SNF for short term rehab.  Explained SNF process, FL2 completed and faxed, may be difficult to place due to weight and so far unable to tolerate knee injection.   Will continue to follow.  Expected Discharge Plan: Skilled Nursing Facility Barriers to Discharge: Continued Medical Work up   Patient Goals and CMS Choice Patient states their goals for this hospitalization and ongoing recovery are:: to get stronger CMS Medicare.gov Compare Post Acute Care list provided to:: Patient Choice offered to / list presented to : Patient  Expected Discharge Plan and Services Expected Discharge Plan: [place]   Discharge Planning Services: CM Consult   Living arrangements for the past 2 months: Single Family Home                 DME Arranged: N/A         HH Arranged: NA           Prior Living Arrangements/Services Living arrangements for the past 2 months: Single Family Home Lives with:: Spouse Patient language and need for interpreter reviewed:: Yes Do you feel safe going back to the place where you live?: Yes(after rehab)      Need for Family Participation in Patient Care: Yes (Comment) Care giver support system in place?: Yes (comment) Current home services: DME Criminal Activity/Legal Involvement Pertinent to Current Situation/Hospitalization: No - Comment as needed  Activities of Daily Living Home Assistive Devices/Equipment: Gilford Rile (specify type) ADL Screening (condition at time of admission) Patient's cognitive ability adequate to safely complete daily activities?: No Is the patient deaf or have difficulty hearing?: No Does the patient have difficulty seeing, even when wearing glasses/contacts?: No Does the patient have difficulty concentrating, remembering, or making decisions?: No Patient able to express need for assistance with ADLs?: Yes Does the patient have difficulty dressing or bathing?: No Independently performs ADLs?: Yes (appropriate for developmental age) Does the patient have difficulty walking or climbing stairs?: Yes Weakness of Legs: Right Weakness of Arms/Hands: None  Permission Sought/Granted   Permission granted to share information with : No              Emotional Assessment Appearance:: Appears stated age Attitude/Demeanor/Rapport: Engaged Affect (typically observed): Accepting Orientation: : Oriented to Self,  Oriented to Place, Oriented to  Time, Oriented to Situation Alcohol / Substance Use: Not Applicable Psych Involvement: No (comment)  Admission diagnosis:  Morbid obesity (HCC) [E66.01] Fall [W19.XXXA] Ambulatory dysfunction [R26.2] Osteoarthritis of right hip, unspecified osteoarthritis type [M16.11] Patient Active Problem List   Diagnosis Date Noted  . Fall 12/27/2019  . Unilateral osteoarthritis of hip,  right 12/27/2019  . Severe obstructive sleep apnea 02/22/2018  . Hypertriglyceridemia 08/24/2017  . Low HDL (under 40) 08/24/2017  . Metabolic syndrome 08/24/2017  . Morbid obesity (HCC) 10/14/2013  . HTN (hypertension) 10/14/2013  . Chest pain 07/26/2012   PCP:  Emi Belfast, FNP Pharmacy:   [facility] [address], [place] - [address] [place] [address] Phone: [phone] Fax: [phone]  [facility] - [place], [place] - [address] [place] [address] Phone: [phone] Fax: [phone]     Social Determinants of Health (SDOH) Interventions    Readmission Risk Interventions No flowsheet data found.

## 2019-12-30 NOTE — [place] FL2 (Signed)
[place] MEDICAID FL2 LEVEL OF CARE SCREENING TOOL     IDENTIFICATION  Patient Name: Michael Payne Birthdate: 03-Dec-1970 Sex: male Admission Date (Current Location): 12/27/2019  [facility] and [place] Number:  Herbalist and Address:  [facility], [place] [address], [place], [place] [address]      Provider Number: [id number]  Attending Physician Name and Address:  Lavina Hamman, MD  Relative Name and Phone Number:  Dago Jungwirth [phone]    Current Level of Care: Hospital Recommended Level of Care: [place] Prior Approval Number:    Date Approved/Denied:   PASRR Number: [id number]  Discharge Plan: SNF    Current Diagnoses: Patient Active Problem List   Diagnosis Date Noted  . Fall 12/27/2019  . Unilateral osteoarthritis of hip, right 12/27/2019  . Severe obstructive sleep apnea 02/22/2018  . Hypertriglyceridemia 08/24/2017  . Low HDL (under 40) 08/24/2017  . Metabolic syndrome 64/40/3474  . Morbid obesity ([place]) 10/14/2013  . HTN (hypertension) 10/14/2013  . Chest pain 07/26/2012    Orientation RESPIRATION BLADDER Height & Weight     Self, Time, Situation  Normal Continent Weight: (!) 227.3 kg Height:  6\' 4"  (193 cm)  BEHAVIORAL SYMPTOMS/MOOD NEUROLOGICAL BOWEL NUTRITION STATUS      Continent Diet  AMBULATORY STATUS COMMUNICATION OF NEEDS Skin   Extensive Assist Verbally Normal                       Personal Care Assistance Level of Assistance  Bathing, Feeding, Dressing Bathing Assistance: Limited assistance Feeding assistance: Limited assistance Dressing Assistance: Limited assistance     Functional Limitations Info             SPECIAL CARE FACTORS FREQUENCY  PT (By licensed PT), OT (By licensed OT)     PT Frequency: five times a week OT Frequency: five times a week            Contractures Contractures Info: Not present    Additional Factors Info  Code Status, Allergies Code  Status Info: full Allergies Info: Lactose intolerance , aspirin           Current Medications (12/30/2019):  This is the current hospital active medication list Current Facility-Administered Medications  Medication Dose Route Frequency Provider Last Rate Last Admin  . acetaminophen (TYLENOL) tablet 650 mg  650 mg Oral Q6H PRN Lavina Hamman, MD      . allopurinol (ZYLOPRIM) tablet 300 mg  300 mg Oral BID Gala Romney L, MD   300 mg at 12/30/19 1114  . amLODipine (NORVASC) tablet 10 mg  10 mg Oral Daily Elwyn Reach, MD   10 mg at 12/30/19 1115  . cholecalciferol (VITAMIN D3) tablet 1,000 Units  1,000 Units Oral Daily Elwyn Reach, MD   1,000 Units at 12/30/19 1114  . colchicine tablet 0.6 mg  0.6 mg Oral BID Gala Romney L, MD   0.6 mg at 12/30/19 1114  . lidocaine (XYLOCAINE) 1 % (with pres) injection           . methylPREDNISolone acetate (DEPO-MEDROL) 80 MG/ML injection           . morphine 4 MG/ML injection 4 mg  4 mg Intravenous Q3H PRN Charlesetta Shanks, MD   4 mg at 12/30/19 1115  . ondansetron (ZOFRAN) tablet 4 mg  4 mg Oral Q6H PRN Elwyn Reach, MD       Or  .  ondansetron (ZOFRAN) injection 4 mg  4 mg Intravenous Q6H PRN Earlie Lou L, MD      . oxyCODONE-acetaminophen (PERCOCET/ROXICET) 5-325 MG per tablet 1 tablet  1 tablet Oral Q4H PRN Rometta Emery, MD   1 tablet at 12/30/19 0804   And  . oxyCODONE (Oxy IR/ROXICODONE) immediate release tablet 5 mg  5 mg Oral Q4H PRN Rometta Emery, MD   5 mg at 12/30/19 0804  . polyethylene glycol (MIRALAX / GLYCOLAX) packet 17 g  17 g Oral Daily Rolly Salter, MD   17 g at 12/29/19 1204  . senna-docusate (Senokot-S) tablet 1 tablet  1 tablet Oral BID Rolly Salter, MD   1 tablet at 12/29/19 1204  . tiZANidine (ZANAFLEX) tablet 2 mg  2 mg Oral TID PRN Rometta Emery, MD   2 mg at 12/30/19 0804     Discharge Medications: Please see discharge summary for a list of discharge medications.  Relevant  Imaging Results:  Relevant Lab Results:   Additional Information SSI [id number] , uses CPAP at night , Right hip osteoarthritis, wt 227.3 kg, ht 6\' 4" , obstructive sleep apnea  , RN

## 2019-12-30 NOTE — Plan of Care (Signed)

## 2019-12-30 NOTE — Progress Notes (Signed)
Patient ID: Michael Payne, male   DOB: 1971-04-02, 49 y.o.   MRN: [id number]    Pt was scheduled for right hip steroid injection today in IR  Unfortunately, pt was on table and unable to withstand lying on table any longer He refused procedure and wanted to be taken back to room.  He was returned to room  IR can re attempt procedure maybe tomorrow if pt is agreeable.  We will call pts RN in am

## 2019-12-30 NOTE — Progress Notes (Signed)
[facility] Progress Note  Patient: Michael Payne    TKZ:[id number]  DOA: 12/27/2019     Date of Service: the patient was seen and examined on 12/30/2019  Chief complaint Hip pain.  Brief hospital course: Morbid obesity, HTN, OSA, osteoarthritis of right hip presents with severe right hip pain and a fall.  Work-up was negative for any acute fracture.  Orthopedic was consulted who felt that the patient does have severe osteoarthritis but currently limited in option given his morbid obesity for surgical intervention.  Recommended IR guided intervention for pain control which is currently scheduled on 12/30/2019. Patient lives at home with his wife does not have any resources to take care of himself and is at risk for frequent fall.  With his OSA at risk for respiratory suppression with opioids therefore currently unsafe to go home until pain is controlled with IR injection.  PT recommends SNF.  Currently further plan is continue pain control with Toradol and monitor response.  Assessment and Plan: 1 status post fall: Mechanical in nature.   May have been worsened due to patient's osteoarthritis.   Also when orthopedics recommendation is obtained.   If no improvement may require skilled nursing placement.  2 right hip osteoarthritis with decreased mobility:  Secondary to progressive worsening of his osteoarthritis.   Probably made worse by his body habitus with high BMI.   Will defer to orthopedics for evaluation and treatment.   Currently being considered for steroid injections. Unable to tolerate the injection on 12/30/2019 due to pain.  Reattempt tomorrow.  3 morbid obesity:  Dietary counseling. Body mass index is 61 kg/m.   4 obstructive sleep apnea:  On CPAP at home.  Resume  5 hypertension:  Resume home regimen  6.  Constipation. Resolved.  Diet: Cardiac diet DVT Prophylaxis: SCD  Advance goals of care discussion: Full code  Family Communication: family was  present at bedside, at the time of interview.   Disposition:  Pt is from home, admitted with fall, still has uncontrolled hip pain and unsafe discharge plan, which precludes a safe discharge. Discharge to SNF, when medically stable.  Subjective: Unable to lay on the bed for IR after staying there for 30 minutes.  No nausea no vomiting.  No fever no chills.  No chest pain.  Constipation resolved.  Physical Exam: General:  alert oriented to time, place, and person.  Appear in moderate distress, affect appropriate Eyes: PERRL ENT: Oral Mucosa Clear, moist  Neck: difficult to assess  JVD,  Cardiovascular: S1 and S2 Present, no Murmur,  Respiratory: good respiratory effort, Bilateral Air entry equal and Decreased, no Crackles, no wheezes Abdomen: Bowel Sound present, Soft and no tenderness,  Skin: no rash Extremities: no Pedal edema, no calf tenderness Neurologic: without any new focal findings Gait not checked due to patient safety concerns  Vitals:   12/29/19 1853 12/29/19 2102 12/30/19 0449 12/30/19 1119  BP: 114/69 128/86 126/72 (!) 148/91  Pulse: 74 77 77 80  Resp: 16 18 16 17   Temp: 98.2 F (36.8 C) 98.4 F (36.9 C) 98 F (36.7 C) 98.4 F (36.9 C)  TempSrc: Axillary Oral Oral Oral  SpO2: 95% 100% 93% 94%  Weight:      Height:        Intake/Output Summary (Last 24 hours) at 12/30/2019 2018 Last data filed at 12/30/2019 0036 Gross per 24 hour  Intake --  Output 300 ml  Net -300 ml   Filed Weights   12/27/19 1432  12/27/19 2329  Weight: (!) 227.3 kg (!) 227.3 kg    Data Reviewed: I have personally reviewed and interpreted daily labs, tele strips, imagings as discussed above. I reviewed all nursing notes, pharmacy notes, vitals, pertinent old records I have discussed plan of care as described above with RN and patient/family.  CBC: Recent Labs  Lab 12/27/19 2026 12/28/19 0403  WBC 8.0 7.6  NEUTROABS 5.1  --   HGB 13.9 13.2  HCT 44.1 42.2  MCV 96.9 97.0  PLT  248 037   Basic Metabolic Panel: Recent Labs  Lab 12/27/19 2026 12/28/19 0403 12/30/19 0723  NA 140 139 140  K 4.0 3.8 4.4  CL 104 102 102  CO2 25 28 26   GLUCOSE 119* 106* 101*  BUN 11 11 12   CREATININE 0.82 0.78 0.79  CALCIUM 9.2 8.9 8.9    Studies: No results found.  Scheduled Meds: . allopurinol  300 mg Oral BID  . amLODipine  10 mg Oral Daily  . cholecalciferol  1,000 Units Oral Daily  . colchicine  0.6 mg Oral BID  . ketorolac  30 mg Intravenous Q6H  . lidocaine      . methylPREDNISolone acetate      . polyethylene glycol  17 g Oral Daily  . senna-docusate  1 tablet Oral BID   Continuous Infusions: PRN Meds: acetaminophen, morphine injection, ondansetron **OR** ondansetron (ZOFRAN) IV, oxyCODONE-acetaminophen **AND** oxyCODONE, tiZANidine  Time spent: 35 minutes  Author: Berle Mull, MD [facility] 12/30/2019 8:18 PM  To reach On-call, see care teams to locate the attending and reach out to them via [email or web address]. If 7PM-7AM, please contact night-coverage If you still have difficulty reaching the attending provider, please page the [facility] (Director on Call) for [facility] on amion for assistance.

## 2019-12-31 NOTE — TOC Progression Note (Signed)
Transition of Care [facility]) - Progression Note    Patient Details  Name: Michael Payne MRN: [id number] Date of Birth: 1971/09/10  Transition of Care [facility] CM/SW Contact  Doy Hutching, [place] Phone Number: 12/31/2019, 12:35 PM  Clinical Narrative:    Pt currently without any confirmed SNF offers. This is due to barriers posed by insurance coverage (SNFs must be in network), mobility limitations and weight. All facilities that [facility] team has f/u with are maxed out at 450lbs for their staffing/equipment.   TOC team RNCM Heather had expressed this to pt yesterday. Continue to follow and arrange disposition as able given limitations.    Expected Discharge Plan: Skilled Nursing Facility Barriers to Discharge: Continued Medical Work up  Expected Discharge Plan and Services Expected Discharge Plan: Skilled Nursing Facility Discharge Planning Services: CM Consult Living arrangements for the past 2 months: Single Family Home             DME Arranged: N/A HH Arranged: NA  Readmission Risk Interventions No flowsheet data found.

## 2019-12-31 NOTE — Progress Notes (Signed)
Occupational Therapy Treatment Patient Details Name: Michael Payne MRN: [id number] DOB: Nov 17, 1970 Today's Date: 12/31/2019    History of present illness Michael Payne is a 49 y.o. male with medical history significant of morbid obesity, hypertension, obstructive sleep apnea, severe osteoarthritis of the right hip who apparently had been trying to get to orthopedics and scheduled for outpatient visit due to severe right hip osteoarthritis.  Patient sustained a fall about a week ago and since then has been unable to walk on his right lower extremity. CT and x-ray positive for advanced OA R hip but no fx.    OT comments  Patient progressing slowly towards goals.  Bed level toileting, total assist after +BM, total assist for LB ADLs.  He transitioned from supine to EOB with min assist +2 for safety, several attempts for sit to stand from EOB with min assist +2 for support/safety before pivoting to recliner using RW with cueing for safety, technique, RW mgmt; noted poor control with descend into chair. Remains limited by weakness and pain. Educated on UE "chair pushup" exercises to increase strength for transfers and ADLs.  Will follow, SNF remains appropriate.    Follow Up Recommendations  SNF    Equipment Recommendations  Other (comment)(defer to next venue of care)    Recommendations for Other Services      Precautions / Restrictions Precautions Precautions: Fall Precaution Comments: fell 3x PTA Restrictions Weight Bearing Restrictions: No       Mobility Bed Mobility Overal bed mobility: Needs Assistance Bed Mobility: Supine to Sit     Supine to sit: Min assist;+2 for physical assistance;+2 for safety/equipment;HOB elevated     General bed mobility comments: patient managing transition from supine to EOB with min assist +2 for safety, increased time and effort   Transfers Overall transfer level: Needs assistance Equipment used: Rolling walker (2 wheeled) Transfers: Sit  to/from [facility] Sit to Stand: Min assist;+2 physical assistance;+2 safety/equipment Stand pivot transfers: Min assist;+2 physical assistance;+2 safety/equipment       General transfer comment: pt stood from elevated EOB, several attempts to "feel ready"; once standing pivoted to recliner using RW with min assist +2 for stabilization of walker; poor control with descend into chair     Balance Overall balance assessment: Needs assistance Sitting-balance support: Feet supported;Single extremity supported Sitting balance-Leahy Scale: Fair Sitting balance - Comments: min guard to safety    Standing balance support: Bilateral upper extremity supported;During functional activity Standing balance-Leahy Scale: Poor Standing balance comment: relaint on BUE and external support for safety                            ADL either performed or assessed with clinical judgement   ADL Overall ADL's : Needs assistance/impaired                     Lower Body Dressing: Total assistance;Sit to/from stand Lower Body Dressing Details (indicate cue type and reason): assist to don socks, min assist +2 to stand but heavy reliance on BUE support  Toilet Transfer: +2 for physical assistance;+2 for safety/equipment;RW;Minimal assistance Toilet Transfer Details (indicate cue type and reason): simulated to recliner  Toileting- Clothing Manipulation and Hygiene: Total assistance;Bed level Toileting - Clothing Manipulation Details (indicate cue type and reason): + BM, total assist for hygiene bed level      Functional mobility during ADLs: Minimal assistance;+2 for physical assistance;+2 for safety/equipment;Rolling walker;Cueing for safety  Vision       Perception     Praxis      Cognition Arousal/Alertness: Awake/alert Behavior During Therapy: WFL for tasks assessed/performed Overall Cognitive Status: Within Functional Limits for tasks assessed                                           Exercises Exercises: Other exercises Other Exercises Other Exercises: chair pushups x 10 reps, 2-3 second hold in extension, for UB strength and transfers   Shoulder Instructions       General Comments lift pad under chair to return to bed    Pertinent Vitals/ Pain       Pain Assessment: Faces Faces Pain Scale: Hurts even more Pain Location: R hip Pain Descriptors / Indicators: Grimacing;Constant Pain Intervention(s): Monitored during session;Repositioned;Limited activity within patient's tolerance  Home Living                                          Prior Functioning/Environment              Frequency  Min 2X/week        Progress Toward Goals  OT Goals(current goals can now be found in the care plan section)  Progress towards OT goals: Progressing toward goals  Acute Rehab OT Goals Patient Stated Goal: get out of bed and sit up in the chair OT Goal Formulation: With patient  Plan Discharge plan remains appropriate;Frequency remains appropriate    Co-evaluation                 AM-PAC OT "6 Clicks" Daily Activity     Outcome Measure   Help from another person eating meals?: None Help from another person taking care of personal grooming?: A Little Help from another person toileting, which includes using toliet, bedpan, or urinal?: Total Help from another person bathing (including washing, rinsing, drying)?: A Lot Help from another person to put on and taking off regular upper body clothing?: A Little Help from another person to put on and taking off regular lower body clothing?: Total 6 Click Score: 14    End of Session Equipment Utilized During Treatment: Gait belt;Rolling walker  OT Visit Diagnosis: Unsteadiness on feet (R26.81);Other abnormalities of gait and mobility (R26.89);Muscle weakness (generalized) (M62.81);Pain Pain - Right/Left: Right Pain - part of body: Hip   Activity  Tolerance Patient tolerated treatment well;Patient limited by pain;Patient limited by fatigue   Patient Left in chair;with call bell/phone within reach   Nurse Communication Mobility status;Need for lift equipment        Time: 1610-9604 OT Time Calculation (min): 39 min  Charges: OT General Charges $OT Visit: 1 Visit OT Treatments $Self Care/Home Management : 38-52 mins  Barry Brunner, OT Acute Rehabilitation Services Pager [phone] Office [phone]    Chancy Milroy 12/31/2019, 3:33 PM

## 2019-12-31 NOTE — Progress Notes (Signed)
[facility] Progress Note  Patient: Michael Payne    JKK:[id number]  DOA: 12/27/2019     Date of Service: the patient was seen and examined on 12/31/2019  Chief complaint Hip pain.  Brief hospital course: Morbid obesity, HTN, OSA, osteoarthritis of right hip presents with severe right hip pain and a fall.  Work-up was negative for any acute fracture.  Orthopedic was consulted who felt that the patient does have severe osteoarthritis but currently limited in option given his morbid obesity for surgical intervention.  Recommended IR guided intervention for pain control which is currently scheduled on 12/30/2019. Patient lives at home with his wife does not have any resources to take care of himself and is at risk for frequent fall.  With his OSA at risk for respiratory suppression with opioids therefore currently unsafe to go home until pain is controlled with IR injection.  PT recommends SNF.  Currently further plan is continue pain control with Toradol and monitor response and also arrange for CT-guided needle placement for hip injection  Assessment and Plan: 1 status post fall: Mechanical in nature.   May have been worsened due to patient's osteoarthritis.   Orthopedic consulted. Recommend conservative measures and pain control.  2 right hip osteoarthritis with decreased mobility:  Secondary to progressive worsening of his osteoarthritis.   Probably made worse by his body habitus with high BMI.   Orthopedic recommends IR guided injection.  Patient unable to tolerate the procedure on 01/07/2020.  Currently awaiting reattempt by IR with CT-guided placement. Also pain control. Patient's mobility is significantly limited due to pain and body habitus and unsafe to go home.  3 morbid obesity:  Dietary counseling. Body mass index is 61 kg/m.  Limiting significant patient's options for placement.  4 obstructive sleep apnea:  On CPAP at home.  Resume  5 hypertension:  Resume home  regimen  6.  Constipation. Resolved.  Diet: Cardiac diet DVT Prophylaxis: SCD  Advance goals of care discussion: Full code  Family Communication: family was present at bedside, at the time of interview.   Disposition:  Pt is from home, admitted with fall, still has uncontrolled hip pain and unsafe discharge plan, which precludes a safe discharge. Discharge to SNF, when medically stable.  Subjective: No nausea no vomiting no fever no chills.  No chest pain abdominal pain.  Physical Exam: General:  alert oriented to time, place, and person.  Appear in moderate distress, affect appropriate Eyes: PERRL ENT: Oral Mucosa Clear, moist  Neck: difficult to assess  JVD,  Cardiovascular: S1 and S2 Present, no Murmur,  Respiratory: good respiratory effort, Bilateral Air entry equal and Decreased, no Crackles, no wheezes Abdomen: Bowel Sound present, Soft and no tenderness,  Skin: no rash Extremities: no Pedal edema, no calf tenderness Neurologic: without any new focal findings Gait not checked due to patient safety concerns  Vitals:   12/30/19 0449 12/30/19 1119 12/30/19 2216 12/31/19 0431  BP: 126/72 (!) 148/91 135/76 110/81  Pulse: 77 80 100 90  Resp: 16 17 18 18   Temp: 98 F (36.7 C) 98.4 F (36.9 C) 97.8 F (36.6 C) 97.6 F (36.4 C)  TempSrc: Oral Oral Oral Oral  SpO2: 93% 94% 98% 92%  Weight:      Height:        Intake/Output Summary (Last 24 hours) at 12/31/2019 1803 Last data filed at 12/31/2019 0819 Gross per 24 hour  Intake 240 ml  Output 1500 ml  Net -1260 ml   Danley Danker  Weights   12/27/19 1432 12/27/19 2329  Weight: (!) 227.3 kg (!) 227.3 kg    Data Reviewed: I have personally reviewed and interpreted daily labs, tele strips, imagings as discussed above. I reviewed all nursing notes, pharmacy notes, vitals, pertinent old records I have discussed plan of care as described above with RN and patient/family.  CBC: Recent Labs  Lab 12/27/19 2026 12/28/19 0403   WBC 8.0 7.6  NEUTROABS 5.1  --   HGB 13.9 13.2  HCT 44.1 42.2  MCV 96.9 97.0  PLT 248 210   Basic Metabolic Panel: Recent Labs  Lab 12/27/19 2026 12/28/19 0403 12/30/19 0723  NA 140 139 140  K 4.0 3.8 4.4  CL 104 102 102  CO2 25 28 26   GLUCOSE 119* 106* 101*  BUN 11 11 12   CREATININE 0.82 0.78 0.79  CALCIUM 9.2 8.9 8.9    Studies: No results found.  Scheduled Meds: . allopurinol  300 mg Oral BID  . amLODipine  10 mg Oral Daily  . cholecalciferol  1,000 Units Oral Daily  . colchicine  0.6 mg Oral BID  . ketorolac  30 mg Intravenous Q6H  . polyethylene glycol  17 g Oral Daily  . senna-docusate  1 tablet Oral BID   Continuous Infusions: PRN Meds: acetaminophen, morphine injection, ondansetron **OR** ondansetron (ZOFRAN) IV, oxyCODONE-acetaminophen **AND** oxyCODONE, tiZANidine  Time spent: 35 minutes  Author: , MD [facility] 12/31/2019 6:03 PM  To reach On-call, see care teams to locate the attending and reach out to them via [email or web address]. If 7PM-7AM, please contact night-coverage If you still have difficulty reaching the attending provider, please page the [facility] (Director on Call) for [facility] on amion for assistance.

## 2019-12-31 NOTE — Progress Notes (Signed)
Patient refuse CPAP for the night. Patient does not wish to wear it

## 2019-12-31 NOTE — Care Management (Addendum)
Called Sierra at [facility] to follow up, left voicemail awaiting call back.   [facility] declined patient.  Updated patient regarding no bed offers.   Ronny Flurry RN

## 2020-01-01 ENCOUNTER — Inpatient Hospital Stay (HOSPITAL_COMMUNITY): Payer: Medicare HMO

## 2020-01-01 LAB — COMPREHENSIVE METABOLIC PANEL
ALT: 60 U/L — ABNORMAL HIGH (ref 0–44)
AST: 43 U/L — ABNORMAL HIGH (ref 15–41)
Albumin: 3.3 g/dL — ABNORMAL LOW (ref 3.5–5.0)
Alkaline Phosphatase: 60 U/L (ref 38–126)
Anion gap: 9 (ref 5–15)
BUN: 13 mg/dL (ref 6–20)
CO2: 29 mmol/L (ref 22–32)
Calcium: 8.9 mg/dL (ref 8.9–10.3)
Chloride: 100 mmol/L (ref 98–111)
Creatinine, Ser: 0.89 mg/dL (ref 0.61–1.24)
GFR calc Af Amer: >60 mL/min (ref >60)
GFR calc non Af Amer: >60 mL/min (ref >60)
Glucose, Bld: 122 mg/dL — ABNORMAL HIGH (ref 70–99)
Potassium: 4 mmol/L (ref 3.5–5.1)
Sodium: 138 mmol/L (ref 135–145)
Total Bilirubin: 0.6 mg/dL (ref 0.3–1.2)
Total Protein: 7.1 g/dL (ref 6.5–8.1)

## 2020-01-01 LAB — CBC WITH DIFFERENTIAL/PLATELET
Abs Immature Granulocytes: 0.02 10*3/uL (ref 0.00–0.07)
Basophils Absolute: 0.1 10*3/uL (ref 0.0–0.1)
Basophils Relative: 1 %
Eosinophils Absolute: 0.5 10*3/uL (ref 0.0–0.5)
Eosinophils Relative: 7 %
HCT: 43.4 % (ref 39.0–52.0)
Hemoglobin: 13.6 g/dL (ref 13.0–17.0)
Immature Granulocytes: 0 %
Lymphocytes Relative: 32 %
Lymphs Abs: 2.1 10*3/uL (ref 0.7–4.0)
MCH: 30.3 pg (ref 26.0–34.0)
MCHC: 31.3 g/dL (ref 30.0–36.0)
MCV: 96.7 fL (ref 80.0–100.0)
Monocytes Absolute: 0.7 10*3/uL (ref 0.1–1.0)
Monocytes Relative: 10 %
Neutro Abs: 3.3 10*3/uL (ref 1.7–7.7)
Neutrophils Relative %: 50 %
Platelets: 246 10*3/uL (ref 150–400)
RBC: 4.49 MIL/uL (ref 4.22–5.81)
RDW: 13 % (ref 11.5–15.5)
WBC: 6.6 10*3/uL (ref 4.0–10.5)
nRBC: 0 % (ref 0.0–0.2)

## 2020-01-01 LAB — MAGNESIUM: Magnesium: 2.1 mg/dL (ref 1.7–2.4)

## 2020-01-01 MED ORDER — BUPIVACAINE HCL (PF) 0.5 % IJ SOLN
INTRAMUSCULAR | Status: AC
  Filled 2020-01-01: qty 30

## 2020-01-01 MED ORDER — DIAZEPAM 2 MG PO TABS
20.0000 mg | ORAL_TABLET | ORAL | Status: AC
  Administered 2020-01-01: 20 mg via ORAL
  Filled 2020-01-01: qty 10

## 2020-01-01 MED ORDER — FENTANYL CITRATE (PF) 100 MCG/2ML IJ SOLN: 50.0000 ug | Freq: Once | INTRAMUSCULAR | Status: AC

## 2020-01-01 MED ORDER — METHYLPREDNISOLONE ACETATE 80 MG/ML IJ SUSP
INTRAMUSCULAR | Status: AC
  Filled 2020-01-01: qty 1

## 2020-01-01 MED ORDER — FENTANYL CITRATE (PF) 100 MCG/2ML IJ SOLN
INTRAMUSCULAR | Status: AC
  Administered 2020-01-01: 15:00:00 50 ug via INTRAVENOUS
  Filled 2020-01-01: qty 2

## 2020-01-01 MED ORDER — METHYLPREDNISOLONE ACETATE 40 MG/ML IJ SUSP
INTRAMUSCULAR | Status: AC
  Filled 2020-01-01: qty 1

## 2020-01-01 NOTE — Progress Notes (Signed)
Called for pt to come down to radiology for procedure. Nurse states that he is ready. Called transporter to pick up pt at 1322. Transporter called me back at 1340 and states that pt is not able to come down at this time, she has been waiting approx. 10 mins for pt. I asked the transporter to have RN call me when pt is ready to come down to radiology.

## 2020-01-01 NOTE — Procedures (Signed)
  Procedure: CT guided R hip injection 120mg  depomedrol, 20ml bupivicaine 0.5% EBL:   minimal Complications:  none immediate  See full dictation in 4m.  [employer] MD Main # [phone] Pager  [phone]

## 2020-01-01 NOTE — Progress Notes (Signed)
Verified with PA that Valium 20 mg to be given pre procedure.

## 2020-01-01 NOTE — Progress Notes (Signed)
Physical Therapy Treatment Patient Details Name: Michael Payne MRN: [id number] DOB: 1971-01-22 Today's Date: 01/01/2020    History of Present Illness Michael Payne is a 49 y.o. male with medical history significant of morbid obesity, hypertension, obstructive sleep apnea, severe osteoarthritis of the right hip who apparently had been trying to get to orthopedics and scheduled for outpatient visit due to severe right hip osteoarthritis.  Patient sustained a fall about a week ago and since then has been unable to walk on his right lower extremity. CT and x-ray positive for advanced OA R hip but no fx. CT guided R hip injection 4/14.    PT Comments    Pt tolerated treatment well. Pt with improved ROM and use of RLE, denies pain after injection today. PT continues to endorse weakness and does continue to require physical assistance to steady during all OOB mobility this session. PT recommends continued use of lift for transfers with nursing staff at this time due to high falls risk. Pt will continue to benefit from aggressive PT POC to improve mobility and reduce falls risk. PT increasing PT POC to aide in improvement of mobility.  Follow Up Recommendations  SNF;Supervision for mobility/OOB     Equipment Recommendations  (bariatric RW, mechanical lift (500+lbs))    Recommendations for Other Services       Precautions / Restrictions Precautions Precautions: Fall Precaution Comments: fell 3x PTA Restrictions Weight Bearing Restrictions: No    Mobility  Bed Mobility Overal bed mobility: Needs Assistance Bed Mobility: Supine to Sit     Supine to sit: Min assist;HOB elevated        Transfers Overall transfer level: Needs assistance Equipment used: (bariatric RW) Transfers: Sit to/from [facility] Sit to Stand: Min assist;From elevated surface Stand pivot transfers: Min assist;From elevated surface       General transfer comment: pt performs 4 sit to stands  from elevated bed with use of RW, Stand Pivot with BUE support of PT  Ambulation/Gait Ambulation/Gait assistance: Mod assist Gait Distance (Feet): 2 Feet Assistive device: 1 person hand held assist Gait Pattern/deviations: Decreased step length - right;Decreased step length - left Gait velocity: reduced Gait velocity interpretation: <1.31 ft/sec, indicative of household ambulator General Gait Details: pt with short steps to turn from bed to recliner. PT supporting anteriorly to allow for forward lean over LE's and maintain balance as pt reaching for armrest of recliner for support   Stairs             Wheelchair Mobility    Modified Rankin (Stroke Patients Only)       Balance Overall balance assessment: Needs assistance Sitting-balance support: Single extremity supported;Feet supported Sitting balance-Leahy Scale: Fair Sitting balance - Comments: minG at edge of bed   Standing balance support: Bilateral upper extremity supported Standing balance-Leahy Scale: Fair Standing balance comment: minG with BUE of bariatric RW                            Cognition Arousal/Alertness: Awake/alert Behavior During Therapy: WFL for tasks assessed/performed Overall Cognitive Status: Within Functional Limits for tasks assessed                                 General Comments: pt making jokes, reports he feels high from valium and fentanyl he received before procedure      Exercises General Exercises - Lower  Extremity Long Arc Quad: AROM;Both;10 reps    General Comments General comments (skin integrity, edema, etc.): lift pad left under patient for use of mechanical lift to transfer back to bed      Pertinent Vitals/Pain Pain Assessment: No/denies pain    Home Living                      Prior Function            PT Goals (current goals can now be found in the care plan section) Acute Rehab PT Goals Patient Stated Goal: get out of bed  and sit up in the chair Progress towards PT goals: Progressing toward goals    Frequency    Min 3X/week      PT Plan Current plan remains appropriate    Co-evaluation              AM-PAC PT "6 Clicks" Mobility   Outcome Measure  Help needed turning from your back to your side while in a flat bed without using bedrails?: None Help needed moving from lying on your back to sitting on the side of a flat bed without using bedrails?: A Little Help needed moving to and from a bed to a chair (including a wheelchair)?: A Lot Help needed standing up from a chair using your arms (e.g., wheelchair or bedside chair)?: A Little Help needed to walk in hospital room?: Total Help needed climbing 3-5 steps with a railing? : Total 6 Click Score: 14    End of Session   Activity Tolerance: Patient tolerated treatment well Patient left: in chair;with call bell/phone within reach Nurse Communication: Mobility status;Need for lift equipment PT Visit Diagnosis: Unsteadiness on feet (R26.81);Muscle weakness (generalized) (M62.81);Difficulty in walking, not elsewhere classified (R26.2);History of falling (Z91.81)     Time: 1962-2297 PT Time Calculation (min) (ACUTE ONLY): 25 min  Charges:  $Therapeutic Activity: 23-37 mins                     Arlyss Gandy, PT, DPT Acute Rehabilitation Pager: [phone]    Arlyss Gandy 01/01/2020, 4:58 PM

## 2020-01-01 NOTE — Progress Notes (Signed)
Patient refused CPAP HS. Patient stated he does not want to wear it tonight

## 2020-01-01 NOTE — Progress Notes (Signed)
[facility] Progress Note  Patient: Michael Payne    WER:[id number]  DOA: 12/27/2019     Date of Service: the patient was seen and examined on 01/01/2020  Chief complaint Hip pain.  Brief hospital course: Morbid obesity, HTN, OSA, osteoarthritis of right hip presents with severe right hip pain and a fall.  Work-up was negative for any acute fracture.  Orthopedic was consulted who felt that the patient does have severe osteoarthritis but currently limited in option given his morbid obesity for surgical intervention.  Recommended IR guided intervention for pain control. Patient lives at home with his wife does not have any resources to take care of himself and is at risk for frequent fall.  With his OSA at risk for respiratory suppression with opioids therefore currently unsafe to go home until pain is controlled with IR injection.  PT recommends SNF.  Assessment and Plan: status post fall: Mechanical in nature.   May have been worsened due to patient's osteoarthritis.   Orthopedic consulted. Recommend conservative measures and pain control.  right hip severe osteoarthritis with decreased mobility:  Secondary to progressive worsening of his osteoarthritis.   Probably made worse by his body habitus with high BMI.   Orthopedic recommends IR guided injection.  Patient unable to tolerate the procedure on 12/30/2019.  Underwent Ct guided joint injection today with bupivacaine and steroids.  Continue adequate pain control.  Continue attempted mobility.   morbid obesity:  Dietary counseling. Body mass index is 61 kg/m.  Limiting significant patient's options for placement.  obstructive sleep apnea:  On CPAP at home.  Resumed  hypertension:  Resumed home regimen.  Stable.   Diet: Cardiac diet DVT Prophylaxis: SCD  Advance goals of care discussion: Full code  Family Communication: Patient.  Disposition:  Pt is from home, admitted with fall, still has uncontrolled hip pain and  unsafe discharge plan, which precludes a safe discharge. Discharge to SNF, when medically stable.  Subjective: Patient seen and examined.  Going for procedure in the morning.  Somehow control of the right hip pain.  Physical Exam: General:  alert oriented to time, place, and person.  Appear in moderate distress, affect appropriate Eyes: PERRL ENT: Oral Mucosa Clear, moist  Neck: difficult to assess  JVD,  Cardiovascular: S1 and S2 Present, no Murmur,  Respiratory: good respiratory effort, Bilateral Air entry equal and Decreased, no Crackles, no wheezes Abdomen: Bowel Sound present, Soft and no tenderness, obese and pendulous. Skin: no rash Extremities: no Pedal edema, no calf tenderness Neurologic: without any new focal findings   Vitals:   12/31/19 0431 12/31/19 2113 01/01/20 0409 01/01/20 1410  BP: 110/81 109/80 (!) 145/93 118/81  Pulse: 90 76 60 90  Resp: 18 20 18 16   Temp: 97.6 F (36.4 C) 97.8 F (36.6 C) 97.7 F (36.5 C) 98.1 F (36.7 C)  TempSrc: Oral Oral Oral Oral  SpO2: 92% 99% 98% 100%  Weight:      Height:        Intake/Output Summary (Last 24 hours) at 01/01/2020 1657 Last data filed at 01/01/2020 1300 Gross per 24 hour  Intake 472 ml  Output 1000 ml  Net -528 ml   Filed Weights   12/27/19 1432 12/27/19 2329  Weight: (!) 227.3 kg (!) 227.3 kg    Data Reviewed: I have personally reviewed and interpreted daily labs, tele strips, imagings as discussed above. I reviewed all nursing notes, pharmacy notes, vitals, pertinent old records I have discussed plan of care as  described above with RN and patient/family.  CBC: Recent Labs  Lab 12/27/19 2026 12/28/19 0403 01/01/20 0402  WBC 8.0 7.6 6.6  NEUTROABS 5.1  --  3.3  HGB 13.9 13.2 13.6  HCT 44.1 42.2 43.4  MCV 96.9 97.0 96.7  PLT 248 210 400   Basic Metabolic Panel: Recent Labs  Lab 12/27/19 2026 12/28/19 0403 12/30/19 0723 01/01/20 0402  NA 140 139 140 138  K 4.0 3.8 4.4 4.0  CL 104 102  102 100  CO2 25 28 26 29   GLUCOSE 119* 106* 101* 122*  BUN 11 11 12 13   CREATININE 0.82 0.78 0.79 0.89  CALCIUM 9.2 8.9 8.9 8.9  MG  --   --   --  2.1    Studies: CT GUIDED NEEDLE PLACEMENT  Result Date: 01/01/2020 CLINICAL DATA:  Severe right hip osteoarthritis and pain. Injection requested. EXAM: CT GUIDED RIGHT HIP INJECTION PROCEDURE: After a thorough discussion of risks and benefits of the procedure, including bleeding, infection, injury to nerves, blood vessels, and adjacent structures, verbal and written consent was obtained. Specific risks of the procedure included nontherapeutic injection and non target injection. The patient was placed supine on the CT table and localization was performed . Target site marked on the skin. The skin was prepped and draped in the usual sterile fashion using chlorhexidine. After local anesthesia with 1% lidocaine without epinephrine , a 5 inch 20 gauge spinal needle was advanced into right hip joint under intermittent CT guidance. Once the needle was in satisfactory position, representative image was captured with the needle demonstrated in the anterior aspect right hip joint. Subsequently, 158mL Depo-Medrol and 5 mL bupivacaine 0.5% was injected into the right hip joint. Needle removed and a sterile dressing applied. No complications were observed. IMPRESSION: Successful CT-guided right hip joint injection. Electronically Signed   By: Lucrezia Europe M.D.   On: 01/01/2020 16:03    Scheduled Meds:  allopurinol  300 mg Oral BID   amLODipine  10 mg Oral Daily   cholecalciferol  1,000 Units Oral Daily   colchicine  0.6 mg Oral BID   ketorolac  30 mg Intravenous Q6H   polyethylene glycol  17 g Oral Daily   senna-docusate  1 tablet Oral BID   Continuous Infusions: PRN Meds: acetaminophen, morphine injection, ondansetron **OR** ondansetron (ZOFRAN) IV, oxyCODONE-acetaminophen **AND** oxyCODONE, tiZANidine  Time spent: 25 minutes

## 2020-01-01 NOTE — Progress Notes (Signed)
Interventional Radiology Brief Note:  Patient brought to CT Monday for attempt at right hip injection.  He was unable to undergo procedure due to anxiety with prolonged position on CT table.  Reattempt planned today with 20mg  valium per Dr. .   Discussed with patient who is agreeable.  Consent signed and in chart.   RN aware to give valium on call to radiology.   Deanne Coffer, MS RD PA-C

## 2020-01-02 NOTE — Progress Notes (Signed)
[facility] Progress Note  Patient: Michael Payne    QQI:[id number]  DOA: 12/27/2019     Date of Service: the patient was seen and examined on 01/02/2020  Chief complaint Hip pain.  Brief hospital course: Morbid obesity, HTN, OSA, osteoarthritis of right hip presents with severe right hip pain and a fall.  Work-up was negative for any acute fracture.  Orthopedic was consulted who felt that the patient does have severe osteoarthritis but currently limited in option given his morbid obesity for surgical intervention.  Recommended IR guided intervention for pain control. Patient lives at home with his wife does not have any resources to take care of himself and is at risk for frequent fall.  With his OSA at risk for respiratory suppression with opioids therefore currently unsafe to go home until pain is controlled with IR injection.  PT recommends SNF.  Unable to find a SNF beds due to unavailability of bariatric support.  Assessment and Plan: status post fall: Mechanical in nature.   May have been worsened due to patient's osteoarthritis.   Orthopedic consulted. Recommend conservative measures and pain control.  right hip severe osteoarthritis with decreased mobility:  Secondary to progressive worsening of his osteoarthritis.   Probably made worse by his body habitus with high BMI.   Orthopedic recommends IR guided injection.  Patient unable to tolerate the procedure on 12/30/2019.  Underwent Ct guided joint injection 4/14 with bupivacaine and steroids.  Continue adequate pain control.  Continue attempted mobility.  Patient has better pain control now.  morbid obesity:  Dietary counseling. Body mass index is 61 kg/m.  Limiting significant patient's options for placement.  obstructive sleep apnea:  On CPAP at home.  Resumed.  Wears some times.  hypertension:  Resumed home regimen.  Stable.   Diet: Cardiac diet DVT Prophylaxis: SCD  Advance goals of care discussion: Full  code  Family Communication: Patient.  Disposition:  Pt is from home, admitted with fall, unable to ambulate safely to go home.   Medically stable, skilled nursing facility unavailable due to unavailability of bariatric care bed.    Subjective: Patient seen and examined.  His hip pain is better, however he still not confident that he can walk without falling.  Waiting to work with PT.  Physical Exam  Constitutional: He is well-developed, well-nourished, and in no distress.  Obese gentleman not in any distress.  On room air.  HENT:  Head: Normocephalic.  Eyes: Pupils are equal, round, and reactive to light.  Cardiovascular: Normal rate.  Pulmonary/Chest: Effort normal.  Musculoskeletal:     Comments: No localized tenderness.  Some limited mobility of the right hip.     Vitals:   01/01/20 1410 01/01/20 2027 01/02/20 0527 01/02/20 0800  BP: 118/81 122/89 (!) 141/95 128/72  Pulse: 90 84 84 72  Resp: 16 18 18    Temp: 98.1 F (36.7 C) 98 F (36.7 C) (!) 97.5 F (36.4 C) 98 F (36.7 C)  TempSrc: Oral Oral Oral Oral  SpO2: 100% 96% 97% 98%  Weight:      Height:        Intake/Output Summary (Last 24 hours) at 01/02/2020 1157 Last data filed at 01/02/2020 0500 Gross per 24 hour  Intake 268 ml  Output 1500 ml  Net -1232 ml   Filed Weights   12/27/19 1432 12/27/19 2329  Weight: (!) 227.3 kg (!) 227.3 kg    Data Reviewed: I have personally reviewed and interpreted daily labs, tele strips, imagings as  discussed above. I reviewed all nursing notes, pharmacy notes, vitals, pertinent old records I have discussed plan of care as described above with RN and patient/family.  CBC: Recent Labs  Lab 12/27/19 2026 12/28/19 0403 01/01/20 0402  WBC 8.0 7.6 6.6  NEUTROABS 5.1  --  3.3  HGB 13.9 13.2 13.6  HCT 44.1 42.2 43.4  MCV 96.9 97.0 96.7  PLT 248 210 246   Basic Metabolic Panel: Recent Labs  Lab 12/27/19 2026 12/28/19 0403 12/30/19 0723 01/01/20 0402  NA 140 139  140 138  K 4.0 3.8 4.4 4.0  CL 104 102 102 100  CO2 25 28 26 29   GLUCOSE 119* 106* 101* 122*  BUN 11 11 12 13   CREATININE 0.82 0.78 0.79 0.89  CALCIUM 9.2 8.9 8.9 8.9  MG  --   --   --  2.1    Studies: CT GUIDED NEEDLE PLACEMENT  Result Date: 01/01/2020 CLINICAL DATA:  Severe right hip osteoarthritis and pain. Injection requested. EXAM: CT GUIDED RIGHT HIP INJECTION PROCEDURE: After a thorough discussion of risks and benefits of the procedure, including bleeding, infection, injury to nerves, blood vessels, and adjacent structures, verbal and written consent was obtained. Specific risks of the procedure included nontherapeutic injection and non target injection. The patient was placed supine on the CT table and localization was performed . Target site marked on the skin. The skin was prepped and draped in the usual sterile fashion using chlorhexidine. After local anesthesia with 1% lidocaine without epinephrine , a 5 inch 20 gauge spinal needle was advanced into right hip joint under intermittent CT guidance. Once the needle was in satisfactory position, representative image was captured with the needle demonstrated in the anterior aspect right hip joint. Subsequently, Depo-Medrol and 5 mL bupivacaine 0.5% was injected into the right hip joint. Needle removed and a sterile dressing applied. No complications were observed. IMPRESSION: Successful CT-guided right hip joint injection. Electronically Signed   By: 01/03/2020 M.D.   On: 01/01/2020 16:03    Scheduled Meds: . allopurinol  300 mg Oral BID  . amLODipine  10 mg Oral Daily  . cholecalciferol  1,000 Units Oral Daily  . colchicine  0.6 mg Oral BID  . ketorolac  30 mg Intravenous Q6H  . polyethylene glycol  17 g Oral Daily  . senna-docusate  1 tablet Oral BID   Continuous Infusions: PRN Meds: acetaminophen, morphine injection, ondansetron **OR** ondansetron (ZOFRAN) IV, oxyCODONE-acetaminophen **AND** oxyCODONE, tiZANidine  Time  spent: 25 minutes

## 2020-01-02 NOTE — TOC Progression Note (Addendum)
Transition of Care [facility]) - Progression Note    Patient Details  Name: Michael Payne MRN: [id number] Date of Birth: 1971-03-04  Transition [facility] CM/SW Contact  Nadene Rubins Adria Devon, RN Phone Number: 01/02/2020, 3:38 PM  Clinical Narrative:     Patient has received a bed offer from [facility] at [facility] [address] [place] [address]   Patient and wife [name] ( at bedside) has accepted bed offer.   Messaged MD for new covid   Confirmed with admissions Christine Owsley [phone] cell [phone] Fax [phone] . Faxed clinicals to [facility].  Called [employer] [phone] to  start insurance authorization .   [facility] referred NCM to [facility] for insurance authorization, then NCM referred to [facility] [phone]. [facility] will not be handling authorization.   [employer] is handling authorization .  Phone number [phone], fax [phone] , case number [id number] Expected Discharge Plan: Skilled Nursing Facility Barriers to Discharge: Continued Medical Work up  Expected Discharge Plan and Services Expected Discharge Plan: Skilled Nursing Facility   Discharge Planning Services: CM Consult Post Acute Care Choice: Skilled Nursing Facility Living arrangements for the past 2 months: Single Family Home                 DME Arranged: N/A DME Agency: NA       HH Arranged: NA           Social Determinants of Health (SDOH) Interventions    Readmission Risk Interventions No flowsheet data found.

## 2020-01-02 NOTE — Progress Notes (Signed)
Physical Therapy Treatment Patient Details Name: Michael Payne MRN: [id number] DOB: [birth year] Today's Date: 01/02/2020    History of Present Illness Michael Payne is a 49 y.o. male with medical history significant of morbid obesity, hypertension, obstructive sleep apnea, severe osteoarthritis of the right hip who apparently had been trying to get to orthopedics and scheduled for outpatient visit due to severe right hip osteoarthritis.  Patient sustained a fall about a week ago and since then has been unable to walk on his right lower extremity. CT and x-ray positive for advanced OA R hip but no fx. CT guided R hip injection 4/14.    PT Comments    Pt tolerated treatment well with some improvement noted in ability to take steps. Gait does remain with short shuffling steps and with significantly reduced tolerance, however pt is able to obtain some foot clearance bilaterally during functional transfers. Pt remains at a high falls risk due to hip pain and significant LE weakness. PT encouraging LE exercise to improve R knee ROM in hopes of increasing knee flexion ROM and strength to aide in standing from lower surfaces. PT continues to recommend use of lift to return to bed at this time as pt unable to stand from low surface of recliner.  Follow Up Recommendations  SNF;Supervision for mobility/OOB     Equipment Recommendations  (bariatric RW)    Recommendations for Other Services       Precautions / Restrictions Precautions Precautions: Fall Precaution Comments: fell 3x PTA Restrictions Weight Bearing Restrictions: No    Mobility  Bed Mobility Overal bed mobility: Needs Assistance Bed Mobility: Supine to Sit     Supine to sit: Supervision;HOB elevated     General bed mobility comments: considerable use of bed rails  Transfers Overall transfer level: Needs assistance Equipment used: (bariatric RW) Transfers: Sit to/from Stand Sit to Stand: Min guard;From elevated  surface Stand pivot transfers: Min assist;From elevated surface       General transfer comment: pt performs 4 sit to stands at edge of bed during session  Ambulation/Gait Ambulation/Gait assistance: Min assist Gait Distance (Feet): 3 Feet Assistive device: (bariatric RW) Gait Pattern/deviations: Decreased step length - right;Decreased step length - left;Shuffle;Trunk flexed Gait velocity: reduced Gait velocity interpretation: <1.31 ft/sec, indicative of household ambulator General Gait Details: pt with short shuffling steps from bed to recliner, forward flexed posture over RW, reduced foot clearance and single leg stance time bilaterally   Stairs             Wheelchair Mobility    Modified Rankin (Stroke Patients Only)       Balance Overall balance assessment: Needs assistance Sitting-balance support: Single extremity supported;Feet supported Sitting balance-Leahy Scale: Good Sitting balance - Comments: supervision   Standing balance support: Bilateral upper extremity supported Standing balance-Leahy Scale: Fair Standing balance comment: minG with BUE support of bariatric RW                            Cognition Arousal/Alertness: Awake/alert Behavior During Therapy: WFL for tasks assessed/performed Overall Cognitive Status: Within Functional Limits for tasks assessed                                        Exercises Other Exercises Other Exercises: press ups in chair for pressure relief, UE and core strengthening Other Exercises: Heels slides- PT  encouraging heel slides in chair and in bed to improve ROM in R knee    General Comments General comments (skin integrity, edema, etc.): lift pad left under patient for transfer back to bed      Pertinent Vitals/Pain Pain Assessment: Faces Faces Pain Scale: Hurts even more Pain Location: R hip Pain Descriptors / Indicators: Grimacing Pain Intervention(s): Monitored during session     Home Living                      Prior Function            PT Goals (current goals can now be found in the care plan section) Acute Rehab PT Goals Patient Stated Goal: get out of bed and sit up in the chair Progress towards PT goals: Progressing toward goals(slowly)    Frequency    Min 3X/week      PT Plan Current plan remains appropriate    Co-evaluation              AM-PAC PT "6 Clicks" Mobility   Outcome Measure  Help needed turning from your back to your side while in a flat bed without using bedrails?: None Help needed moving from lying on your back to sitting on the side of a flat bed without using bedrails?: A Little Help needed moving to and from a bed to a chair (including a wheelchair)?: A Little Help needed standing up from a chair using your arms (e.g., wheelchair or bedside chair)?: A Little Help needed to walk in hospital room?: Total Help needed climbing 3-5 steps with a railing? : Total 6 Click Score: 15    End of Session   Activity Tolerance: Patient tolerated treatment well Patient left: in chair;with call bell/phone within reach;with nursing/sitter in room Nurse Communication: Mobility status;Need for lift equipment PT Visit Diagnosis: Unsteadiness on feet (R26.81);Muscle weakness (generalized) (M62.81);Difficulty in walking, not elsewhere classified (R26.2);History of falling (Z91.81)     Time: 9147-8295 PT Time Calculation (min) (ACUTE ONLY): 23 min  Charges:  $Therapeutic Activity: 23-37 mins                     Zenaida Niece, PT, DPT Acute Rehabilitation Pager: [phone]    Zenaida Niece 01/02/2020, 5:45 PM

## 2020-01-02 NOTE — Progress Notes (Signed)
Pt continues to refuse CPAP QHS. Pt states our machine isn't comfortable for him. Machine removed from room per pt request, and order discontinued. Pt aware that he can notify for RT if he decides he wants to try use machine again.

## 2020-01-02 NOTE — Care Management (Addendum)
TOC team unable to find SNF bed for patient. SNF max weight 450 LBS for staff and equipment. NCM called Rhonda in PT, PT will try to see patient frequently to increase strength to go home.   Explained above to patient. Patient still hoping for a SNF. Again unfortunately, TOC unable to find a SNF that can accommodate patient.   Patient agreeable to work with PT.  Patient wanted NCM to call [facility] and ask if they know of a SNF for bariatric patients. NCM called same and was told they do not know of SNF for patient greater than 500 lbs. Patient aware.   NCM discussed home health with patient. Explained can arrange HHPT/OT , however they will not visit daily or for long periods of time. Patient will discuss with his wife.   1220 Allison from [facility] coming this afternoon to see patient for possible admission to one of her [facility]F's . Patient aware and agrees.   Ronny Flurry RN

## 2020-01-03 DIAGNOSIS — W19XXXA Unspecified fall, initial encounter: Secondary | ICD-10-CM | POA: Diagnosis not present

## 2020-01-03 DIAGNOSIS — K529 Noninfective gastroenteritis and colitis, unspecified: Secondary | ICD-10-CM | POA: Diagnosis not present

## 2020-01-03 DIAGNOSIS — R531 Weakness: Secondary | ICD-10-CM | POA: Diagnosis not present

## 2020-01-03 DIAGNOSIS — M109 Gout, unspecified: Secondary | ICD-10-CM | POA: Diagnosis not present

## 2020-01-03 DIAGNOSIS — R457 State of emotional shock and stress, unspecified: Secondary | ICD-10-CM | POA: Diagnosis not present

## 2020-01-03 DIAGNOSIS — R2681 Unsteadiness on feet: Secondary | ICD-10-CM | POA: Diagnosis not present

## 2020-01-03 DIAGNOSIS — K5901 Slow transit constipation: Secondary | ICD-10-CM | POA: Diagnosis not present

## 2020-01-03 DIAGNOSIS — E559 Vitamin D deficiency, unspecified: Secondary | ICD-10-CM | POA: Diagnosis not present

## 2020-01-03 DIAGNOSIS — M25551 Pain in right hip: Secondary | ICD-10-CM | POA: Diagnosis not present

## 2020-01-03 DIAGNOSIS — K5909 Other constipation: Secondary | ICD-10-CM | POA: Diagnosis not present

## 2020-01-03 DIAGNOSIS — G4733 Obstructive sleep apnea (adult) (pediatric): Secondary | ICD-10-CM

## 2020-01-03 DIAGNOSIS — D649 Anemia, unspecified: Secondary | ICD-10-CM | POA: Diagnosis not present

## 2020-01-03 DIAGNOSIS — R2689 Other abnormalities of gait and mobility: Secondary | ICD-10-CM | POA: Diagnosis not present

## 2020-01-03 DIAGNOSIS — M160 Bilateral primary osteoarthritis of hip: Secondary | ICD-10-CM | POA: Diagnosis not present

## 2020-01-03 DIAGNOSIS — S30810A Abrasion of lower back and pelvis, initial encounter: Secondary | ICD-10-CM | POA: Diagnosis not present

## 2020-01-03 DIAGNOSIS — I10 Essential (primary) hypertension: Secondary | ICD-10-CM | POA: Diagnosis not present

## 2020-01-03 DIAGNOSIS — R52 Pain, unspecified: Secondary | ICD-10-CM | POA: Diagnosis not present

## 2020-01-03 DIAGNOSIS — Z7401 Bed confinement status: Secondary | ICD-10-CM | POA: Diagnosis not present

## 2020-01-03 DIAGNOSIS — D519 Vitamin B12 deficiency anemia, unspecified: Secondary | ICD-10-CM | POA: Diagnosis not present

## 2020-01-03 DIAGNOSIS — M170 Bilateral primary osteoarthritis of knee: Secondary | ICD-10-CM | POA: Diagnosis not present

## 2020-01-03 DIAGNOSIS — M6281 Muscle weakness (generalized): Secondary | ICD-10-CM | POA: Diagnosis not present

## 2020-01-03 DIAGNOSIS — M2550 Pain in unspecified joint: Secondary | ICD-10-CM | POA: Diagnosis not present

## 2020-01-03 DIAGNOSIS — Z743 Need for continuous supervision: Secondary | ICD-10-CM | POA: Diagnosis not present

## 2020-01-03 DIAGNOSIS — K219 Gastro-esophageal reflux disease without esophagitis: Secondary | ICD-10-CM | POA: Diagnosis not present

## 2020-01-03 DIAGNOSIS — R0602 Shortness of breath: Secondary | ICD-10-CM | POA: Diagnosis not present

## 2020-01-03 DIAGNOSIS — Z79899 Other long term (current) drug therapy: Secondary | ICD-10-CM | POA: Diagnosis not present

## 2020-01-03 DIAGNOSIS — Z0000 Encounter for general adult medical examination without abnormal findings: Secondary | ICD-10-CM | POA: Diagnosis not present

## 2020-01-03 DIAGNOSIS — R279 Unspecified lack of coordination: Secondary | ICD-10-CM | POA: Diagnosis not present

## 2020-01-03 DIAGNOSIS — R262 Difficulty in walking, not elsewhere classified: Secondary | ICD-10-CM | POA: Diagnosis not present

## 2020-01-03 DIAGNOSIS — M1990 Unspecified osteoarthritis, unspecified site: Secondary | ICD-10-CM | POA: Diagnosis not present

## 2020-01-03 DIAGNOSIS — Y998 Other external cause status: Secondary | ICD-10-CM | POA: Diagnosis not present

## 2020-01-03 DIAGNOSIS — K5900 Constipation, unspecified: Secondary | ICD-10-CM | POA: Diagnosis not present

## 2020-01-03 DIAGNOSIS — R739 Hyperglycemia, unspecified: Secondary | ICD-10-CM | POA: Diagnosis not present

## 2020-01-03 DIAGNOSIS — R0902 Hypoxemia: Secondary | ICD-10-CM | POA: Diagnosis not present

## 2020-01-03 DIAGNOSIS — M62838 Other muscle spasm: Secondary | ICD-10-CM | POA: Diagnosis not present

## 2020-01-03 DIAGNOSIS — R069 Unspecified abnormalities of breathing: Secondary | ICD-10-CM | POA: Diagnosis not present

## 2020-01-03 DIAGNOSIS — R5381 Other malaise: Secondary | ICD-10-CM | POA: Diagnosis not present

## 2020-01-03 DIAGNOSIS — M25569 Pain in unspecified knee: Secondary | ICD-10-CM | POA: Diagnosis not present

## 2020-01-03 DIAGNOSIS — M1611 Unilateral primary osteoarthritis, right hip: Secondary | ICD-10-CM | POA: Diagnosis not present

## 2020-01-03 LAB — SARS CORONAVIRUS 2 (TAT 6-24 HRS): SARS Coronavirus 2: NEGATIVE

## 2020-01-03 LAB — CREATININE, SERUM
Creatinine, Ser: 0.9 mg/dL (ref 0.61–1.24)
GFR calc Af Amer: >60 mL/min (ref >60)
GFR calc non Af Amer: >60 mL/min (ref >60)

## 2020-01-03 MED ORDER — POLYETHYLENE GLYCOL 3350 17 G PO PACK: 17.0000 g | PACK | Freq: Every day | ORAL | 0 refills | Status: DC

## 2020-01-03 MED ORDER — OXYCODONE-ACETAMINOPHEN 10-325 MG PO TABS: 1.0000 | ORAL_TABLET | Freq: Four times a day (QID) | ORAL | 0 refills | Status: AC | PRN

## 2020-01-03 MED ORDER — ACETAMINOPHEN 325 MG PO TABS: 650.0000 mg | ORAL_TABLET | Freq: Four times a day (QID) | ORAL | Status: DC | PRN

## 2020-01-03 MED ORDER — SENNOSIDES-DOCUSATE SODIUM 8.6-50 MG PO TABS: 1.0000 | ORAL_TABLET | Freq: Two times a day (BID) | ORAL | Status: DC

## 2020-01-03 NOTE — Care Management Important Message (Signed)
Important Message  Patient Details  Name: Michael Payne MRN: [id number] Date of Birth: 1971/04/11   Medicare Important Message Given:  Yes     Kacy Hegna Stefan Church 01/03/2020, 4:23 PM

## 2020-01-03 NOTE — Progress Notes (Signed)
Pt is discharged to SNF [facility].  Attempted to give report x 2.  Discharge instructions given to patient.

## 2020-01-03 NOTE — Discharge Summary (Signed)
Physician Discharge Summary  Michael Payne IWP:[id number] DOB: 01/18/1971 DOA: 12/27/2019  PCP: Emi Belfast, FNP  Admit date: 12/27/2019 Discharge date: 01/03/2020  Admitted From: Home Disposition: Skilled nursing rehab  Recommendations for Outpatient Follow-up:  1. To plan after discharge from rehab.   Discharge Condition: Stable CODE STATUS: Full code Diet recommendation: Low-salt diet, low carbohydrate.  Discharge summary: Morbid obesity, HTN, OSA, osteoarthritis of right hip presents with severe right hip pain and a fall. Work-up was negative for any acute fracture.  Orthopedic was consulted who felt that the patient does have severe osteoarthritis but currently limited in option given his morbid obesity for surgical intervention.  Recommended IR guided intervention for pain control. Patient lives at home with his wife does not have any resources to take care of himself and is at risk for frequent falls.  # right hip severe osteoarthritis with decreased mobility: Secondary to progressive worsening of his osteoarthritis that is Probably made worse by his body habitus with high BMI.  Orthopedic recommends IR guided injection.   Underwent Ct guided joint injection 4/14 with bupivacaine and steroids.  Continue adequate pain control.  Continue attempted mobility.  Patient is under pain management and has been prescribed Percocet 10/325 every 4 hours by his pain management provider.  Records were reviewed.  He is going to rehab and will need prescriptions.  Short-term prescriptions are provided.  Advised to use less and less opiate medications.  Also use laxatives and stool softener along with opiates.   # Obstructive sleep apnea: On CPAP at home. Resumed.  Wears some times.  He can bring his own CPAP.  # hypertension: Resumed home regimen.  Stable.  #Morbid obesity, BMI more than 60.  He will definitely benefit with weight loss programs.  Encouraged him to follow-up  outpatient.  Patient is medically stable.  He will benefit with continue to work with PT OT and is able to transfer to skilled level of care today.   Discharge Diagnoses:  Principal Problem:   Fall Active Problems:   Morbid obesity (HCC)   HTN (hypertension)   Hypertriglyceridemia   Severe obstructive sleep apnea   Unilateral osteoarthritis of hip, right    Discharge Instructions  Discharge Instructions    Diet - low sodium heart healthy   Complete by: As directed    Increase activity slowly   Complete by: As directed      Allergies as of 01/03/2020      Reactions   Aspirin Other (See Comments)   Upset stomach   Lactose Intolerance (gi)       Medication List    TAKE these medications   acetaminophen 325 MG tablet Commonly known as: TYLENOL Take 2 tablets (650 mg total) by mouth every 6 (six) hours as needed for mild pain, fever or headache. What changed:   medication strength  how much to take  when to take this  reasons to take this   allopurinol 300 MG tablet Commonly known as: ZYLOPRIM Take 1 tablet (300 mg total) by mouth 2 (two) times daily.   amLODipine 10 MG tablet Commonly known as: NORVASC Take 1 tablet (10 mg total) by mouth daily.   celecoxib 200 MG capsule Commonly known as: CELEBREX TAKE 1 CAPSULE BY MOUTH 2 TIMES DAILY AS NEEDED FOR MODERATE PAIN. What changed: See the new instructions.   Colcrys 0.6 MG tablet Generic drug: colchicine TAKE 1 TABLET BY MOUTH DAILY AS NEEDED   oxyCODONE-acetaminophen 10-325 MG tablet Commonly known  as: PERCOCET Take 1 tablet by mouth every 6 (six) hours as needed for up to 5 days for pain. What changed: when to take this   polyethylene glycol 17 g packet Commonly known as: MIRALAX / GLYCOLAX Take 17 g by mouth daily. Start taking on: January 04, 2020   senna-docusate 8.6-50 MG tablet Commonly known as: Senokot-S Take 1 tablet by mouth 2 (two) times daily.   tiZANidine 2 MG tablet Commonly  known as: ZANAFLEX Take 2 mg by mouth 3 (three) times daily as needed for muscle spasms.   VITAMIN D3 PO Take 1 tablet by mouth daily.       Allergies  Allergen Reactions  . Aspirin Other (See Comments)    Upset stomach  . Lactose Intolerance (Gi)     Consultations:  Orthopedics  Interventional radiology   Procedures/Studies: DG Knee 2 Views Right  Result Date: 12/27/2019 CLINICAL DATA:  Right hip pain for 6 months. EXAM: RIGHT KNEE - 1-2 VIEW COMPARISON:  10/14/2013 FINDINGS: AP and lateral views of the right knee without signs of fracture or dislocation. Tricompartmental degenerative changes. Possible small joint effusion. Suggestion of edema about the calf. IMPRESSION: Mild tricompartmental degenerative changes with possible small joint effusion. Question lower extremity edema. Electronically Signed   By: Donzetta Kohut M.D.   On: 12/27/2019 16:34   CT GUIDED NEEDLE PLACEMENT  Result Date: 01/01/2020 CLINICAL DATA:  Severe right hip osteoarthritis and pain. Injection requested. EXAM: CT GUIDED RIGHT HIP INJECTION PROCEDURE: After a thorough discussion of risks and benefits of the procedure, including bleeding, infection, injury to nerves, blood vessels, and adjacent structures, verbal and written consent was obtained. Specific risks of the procedure included nontherapeutic injection and non target injection. The patient was placed supine on the CT table and localization was performed . Target site marked on the skin. The skin was prepped and draped in the usual sterile fashion using chlorhexidine. After local anesthesia with 1% lidocaine without epinephrine , a 5 inch 20 gauge spinal needle was advanced into right hip joint under intermittent CT guidance. Once the needle was in satisfactory position, representative image was captured with the needle demonstrated in the anterior aspect right hip joint. Subsequently, Depo-Medrol and 5 mL bupivacaine 0.5% was injected into the  right hip joint. Needle removed and a sterile dressing applied. No complications were observed. IMPRESSION: Successful CT-guided right hip joint injection. Electronically Signed   By: Corlis Leak M.D.   On: 01/01/2020 16:03   CT Hip Right Wo Contrast  Result Date: 12/27/2019 CLINICAL DATA:  Hip trauma, fracture suspected. Right leg pain for 6 months, with inability to bear weight for 2 days. EXAM: CT OF THE RIGHT HIP WITHOUT CONTRAST TECHNIQUE: Multidetector CT imaging of the right hip was performed according to the standard protocol. Multiplanar CT image reconstructions were also generated. COMPARISON:  Radiographs 12/27/2019 FINDINGS: Bones/Joint/Cartilage Severe right hip osteoarthritis with joint space narrowing, osteophytes, subchondral sclerosis and cyst formation. No evidence of acute fracture or dislocation. No large joint effusion seen. Additional degenerative changes are present within the lower lumbar spine and right sacroiliac joint. Ligaments Suboptimally assessed by CT. Muscles and Tendons No focal muscular abnormality. Soft tissues No focal periarticular hematoma or other fluid collection. The visualized internal pelvic contents are unremarkable. IMPRESSION: Severe right hip osteoarthritis. No evidence of acute fracture or dislocation. Electronically Signed   By: Carey Bullocks M.D.   On: 12/27/2019 18:57   DG Hip Unilat With Pelvis 2-3 Views Right  Result  Date: 12/27/2019 CLINICAL DATA:  Fall. RIGHT hip pain for 6 months. EXAM: DG HIP (WITH OR WITHOUT PELVIS) 2-3V RIGHT COMPARISON:  None. FINDINGS: There are significant degenerative changes in the RIGHT hip. The LATERAL view is nondiagnostic secondary to technique/patient body habitus. Although no displaced fracture is identified, minimally displaced fracture would be difficult to exclude given the technique. Degenerative changes are seen in the LEFT hip, to a lesser degree. Visualized portion of the pelvis is unremarkable. IMPRESSION: 1.  Significant degenerative changes in the RIGHT hip. 2. No evidence for acute abnormality. 3. Consider further imaging if there is strong clinical concern for fracture. Electronically Signed   By: Norva Pavlov M.D.   On: 12/27/2019 16:32     Subjective: Patient was seen and examined.  No overnight events.  Not much pain on the right hip while sleeping.  Excited to go to rehab and getting his strength and balance.   Discharge Exam: Vitals:   01/02/20 2248 01/03/20 0506  BP: (!) 148/80 (!) 145/87  Pulse: 84 73  Resp: 18 19  Temp: 98.6 F (37 C) 97.6 F (36.4 C)  SpO2: 100% 96%   Vitals:   01/02/20 1341 01/02/20 1400 01/02/20 2248 01/03/20 0506  BP: (!) 162/99 (!) 151/82 (!) 148/80 (!) 145/87  Pulse: 87  84 73  Resp:   18 19  Temp:   98.6 F (37 C) 97.6 F (36.4 C)  TempSrc:   Oral Oral  SpO2: 100%  100% 96%  Weight:      Height:        General: Pt is alert, awake, not in acute distress Cardiovascular: RRR, S1/S2 +, no rubs, no gallops Respiratory: CTA bilaterally, no wheezing, no rhonchi Abdominal: Soft, NT, ND, bowel sounds +, obese and pendulous. Extremities: no edema, no cyanosis, no localized tenderness.    The results of significant diagnostics from this hospitalization (including imaging, microbiology, ancillary and laboratory) are listed below for reference.     Microbiology: Recent Results (from the past 240 hour(s))  SARS CORONAVIRUS 2 (TAT 6-24 HRS) Nasopharyngeal Nasopharyngeal Swab     Status: None   Collection Time: 12/27/19  8:26 PM   Specimen: Nasopharyngeal Swab  Result Value Ref Range Status   SARS Coronavirus 2 NEGATIVE NEGATIVE Final    Comment: (NOTE) SARS-CoV-2 target nucleic acids are NOT DETECTED. The SARS-CoV-2 RNA is generally detectable in upper and lower respiratory specimens during the acute phase of infection. Negative results do not preclude SARS-CoV-2 infection, do not rule out co-infections with other pathogens, and should not be  used as the sole basis for treatment or other patient management decisions. Negative results must be combined with clinical observations, patient history, and epidemiological information. The expected result is Negative. Fact Sheet for Patients: [employer]Slick[employer]no Fact Sheet for Healthcare Providers: quierodirigir.com This test is not yet approved or cleared by the [place] FDA and  has been authorized for detection and/or diagnosis of SARS-CoV-2 by FDA under an Emergency Use Authorization (EUA). This EUA will remain  in effect (meaning this test can be used) for the duration of the COVID-19 declaration under Section 56 4(b)(1) of the Act, 21 U.S.C. section 360bbb-3(b)(1), unless the authorization is terminated or revoked sooner. Performed at [facility], [address], [place], [place] [address]      Labs: BNP (last 3 results) No results for input(s): BNP in the last 8760 hours. Basic Metabolic Panel: Recent Labs  Lab 12/27/19 2026 12/28/19 0403 12/30/19 8469 01/01/20 0402  01/03/20 0233  NA 140 139 140 138  --   K 4.0 3.8 4.4 4.0  --   CL 104 102 102 100  --   CO2 25 28 26 29   --   GLUCOSE 119* 106* 101* 122*  --   BUN 11 11 12 13   --   CREATININE 0.82 0.78 0.79 0.89 0.90  CALCIUM 9.2 8.9 8.9 8.9  --   MG  --   --   --  2.1  --    Liver Function Tests: Recent Labs  Lab 12/27/19 2026 12/28/19 0403 01/01/20 0402  AST 32 27 43*  ALT 42 40 60*  ALKPHOS 67 67 60  BILITOT 0.4 0.7 0.6  PROT 7.6 6.9 7.1  ALBUMIN 3.7 3.4* 3.3*   No results for input(s): LIPASE, AMYLASE in the last 168 hours. No results for input(s): AMMONIA in the last 168 hours. CBC: Recent Labs  Lab 12/27/19 2026 12/28/19 0403 01/01/20 0402  WBC 8.0 7.6 6.6  NEUTROABS 5.1  --  3.3  HGB 13.9 13.2 13.6  HCT 44.1 42.2 43.4  MCV 96.9 97.0 96.7  PLT 248 210 246   Cardiac Enzymes: No results for input(s): CKTOTAL, CKMB, CKMBINDEX,  TROPONINI in the last 168 hours. BNP: Invalid input(s): POCBNP CBG: No results for input(s): GLUCAP in the last 168 hours. D-Dimer No results for input(s): DDIMER in the last 72 hours. Hgb A1c No results for input(s): HGBA1C in the last 72 hours. Lipid Profile No results for input(s): CHOL, HDL, LDLCALC, TRIG, CHOLHDL, LDLDIRECT in the last 72 hours. Thyroid function studies No results for input(s): TSH, T4TOTAL, T3FREE, THYROIDAB in the last 72 hours.  Invalid input(s): FREET3 Anemia work up No results for input(s): VITAMINB12, FOLATE, FERRITIN, TIBC, IRON, RETICCTPCT in the last 72 hours. Urinalysis    Component Value Date/Time   COLORURINE YELLOW 12/27/2019 2026   APPEARANCEUR CLEAR 12/27/2019 2026   LABSPEC 1.025 12/27/2019 2026   PHURINE 5.0 12/27/2019 2026   GLUCOSEU NEGATIVE 12/27/2019 2026   HGBUR NEGATIVE 12/27/2019 2026   BILIRUBINUR NEGATIVE 12/27/2019 2026   BILIRUBINUR Neg 12/18/2019 1728   KETONESUR NEGATIVE 12/27/2019 2026   PROTEINUR NEGATIVE 12/27/2019 2026   UROBILINOGEN 0.2 12/18/2019 1728   UROBILINOGEN 0.2 05/04/2010 1412   NITRITE NEGATIVE 12/27/2019 2026   LEUKOCYTESUR TRACE (A) 12/27/2019 2026   Sepsis Labs Invalid input(s): PROCALCITONIN,  WBC,  LACTICIDVEN Microbiology Recent Results (from the past 240 hour(s))  SARS CORONAVIRUS 2 (TAT 6-24 HRS) Nasopharyngeal Nasopharyngeal Swab     Status: None   Collection Time: 12/27/19  8:26 PM   Specimen: Nasopharyngeal Swab  Result Value Ref Range Status   SARS Coronavirus 2 NEGATIVE NEGATIVE Final    Comment: (NOTE) SARS-CoV-2 target nucleic acids are NOT DETECTED. The SARS-CoV-2 RNA is generally detectable in upper and lower respiratory specimens during the acute phase of infection. Negative results do not preclude SARS-CoV-2 infection, do not rule out co-infections with other pathogens, and should not be used as the sole basis for treatment or other patient management decisions. Negative results  must be combined with clinical observations, patient history, and epidemiological information. The expected result is Negative. Fact Sheet for Patients: 2027 Fact Sheet for Healthcare Providers: 02/26/20 This test is not yet approved or cleared by the [employer] FDA and  has been authorized for detection and/or diagnosis of SARS-CoV-2 by FDA under an Emergency Use Authorization (EUA). This EUA will remain  in effect (meaning this test can be used) for  the duration of the COVID-19 declaration under Section 56 4(b)(1) of the Act, 21 U.S.C. section 360bbb-3(b)(1), unless the authorization is terminated or revoked sooner. Performed at [facility], [place] [address], [place], [place] [address]      Time coordinating discharge:  32 minutes  SIGNED:   Barb Merino, MD  [facility] 01/03/2020, 11:16 AM

## 2020-01-03 NOTE — Care Management (Addendum)
1210 PTAR arranged for 2 pm. Patient aware, Christine at [facility] aware .   1130 Discharge summary faxed to Christine at [facility]. Patient room number will be 118 , number to call report is [phone].   1100 Auth information given to [place] at [facility]. Covid test not back. [facility] can still accept patient today. They will do a rapid covid test when patient arrives. Patient aware. 1055 update : Approved for SNF auth [id number], for 5 days continued clinicals need to be faxed to  [phone] by five pm April 21 , progress notes MD and PT, OT notes   Contact persom at [facility] for SNF is Amedeo Kinsman 1 [phone] ext [phone]  Spoke to [place] with [employer]. She has received the request and clinicals. Determination has not been made as of yet. Bradly Chris will review and call NCM back.   Rene [phone] ext [phone].  Ronny Flurry RN

## 2020-01-08 ENCOUNTER — Telehealth: Payer: Self-pay

## 2020-01-08 ENCOUNTER — Ambulatory Visit: Payer: Medicare HMO

## 2020-01-08 NOTE — Telephone Encounter (Signed)
Called patient to complete his Medicare visit and he stated that he was about to go to physical therapy in about 5 minutes. Appointment was cancelled. He states he will call back and reschedule.

## 2020-01-17 ENCOUNTER — Telehealth: Payer: Self-pay | Admitting: Family Medicine

## 2020-01-17 NOTE — Telephone Encounter (Signed)
Patient was recently in hospital and discharged to skilled nursing facility. Please call patient and check on him. Is he still in SNF? Schedule him for follow up if he is agreeable.

## 2020-01-17 NOTE — Telephone Encounter (Signed)
Patient states that he is not happy at all with the care he is receiving at the SNF - He is at [facility] in [place]. He states that the workers there are very rude and he doesn't feel like he is getting adequate care, but he states he is hoping to be discharged next week. Patient states he has been unable to walk for 20 days, but he is still at the SNF and they have had him up walking for the past 2 days, and had him doing leg exercises -  and he states he feels like he is making progress slowly but surely. Patient states he has been feeling better day by day, but is still not at 100%.

## 2020-01-17 NOTE — Telephone Encounter (Signed)
I left message for patient to return phone call.   

## 2020-01-20 ENCOUNTER — Telehealth: Payer: Self-pay | Admitting: Family Medicine

## 2020-01-20 NOTE — Telephone Encounter (Signed)
Noted  

## 2020-01-20 NOTE — Telephone Encounter (Signed)
Pt called in to see if there is a way to find out his blood type. I told him that I know if he donates, he can find out but I was unsure if there was another way. Is there another way to find out his blood type?

## 2020-01-21 NOTE — Telephone Encounter (Signed)
Spoke with patient, he states that he has donated blood in the past, he has not reached out to the [employer] yet to see if they still have record of this. He will call back if no answers calling them.   Nothing further needed.

## 2020-01-24 DIAGNOSIS — G4733 Obstructive sleep apnea (adult) (pediatric): Secondary | ICD-10-CM | POA: Diagnosis not present

## 2020-01-24 DIAGNOSIS — I10 Essential (primary) hypertension: Secondary | ICD-10-CM | POA: Diagnosis not present

## 2020-01-24 DIAGNOSIS — M1611 Unilateral primary osteoarthritis, right hip: Secondary | ICD-10-CM | POA: Diagnosis not present

## 2020-01-24 DIAGNOSIS — M62838 Other muscle spasm: Secondary | ICD-10-CM | POA: Diagnosis not present

## 2020-01-28 DIAGNOSIS — I10 Essential (primary) hypertension: Secondary | ICD-10-CM | POA: Diagnosis not present

## 2020-01-28 DIAGNOSIS — M1611 Unilateral primary osteoarthritis, right hip: Secondary | ICD-10-CM | POA: Diagnosis not present

## 2020-01-28 DIAGNOSIS — G4733 Obstructive sleep apnea (adult) (pediatric): Secondary | ICD-10-CM | POA: Diagnosis not present

## 2020-02-03 DIAGNOSIS — K529 Noninfective gastroenteritis and colitis, unspecified: Secondary | ICD-10-CM | POA: Diagnosis not present

## 2020-02-03 DIAGNOSIS — K5900 Constipation, unspecified: Secondary | ICD-10-CM | POA: Diagnosis not present

## 2020-02-03 DIAGNOSIS — M1611 Unilateral primary osteoarthritis, right hip: Secondary | ICD-10-CM | POA: Diagnosis not present

## 2020-02-07 DIAGNOSIS — K5901 Slow transit constipation: Secondary | ICD-10-CM | POA: Diagnosis not present

## 2020-02-07 DIAGNOSIS — I10 Essential (primary) hypertension: Secondary | ICD-10-CM | POA: Diagnosis not present

## 2020-02-07 DIAGNOSIS — M109 Gout, unspecified: Secondary | ICD-10-CM | POA: Diagnosis not present

## 2020-02-19 DIAGNOSIS — I10 Essential (primary) hypertension: Secondary | ICD-10-CM | POA: Diagnosis not present

## 2020-02-19 DIAGNOSIS — S30810A Abrasion of lower back and pelvis, initial encounter: Secondary | ICD-10-CM | POA: Diagnosis not present

## 2020-02-19 DIAGNOSIS — M25569 Pain in unspecified knee: Secondary | ICD-10-CM | POA: Diagnosis not present

## 2020-02-19 DIAGNOSIS — M109 Gout, unspecified: Secondary | ICD-10-CM | POA: Diagnosis not present

## 2020-02-21 DIAGNOSIS — M170 Bilateral primary osteoarthritis of knee: Secondary | ICD-10-CM | POA: Diagnosis not present

## 2020-02-21 DIAGNOSIS — I10 Essential (primary) hypertension: Secondary | ICD-10-CM | POA: Diagnosis not present

## 2020-02-24 DIAGNOSIS — M25569 Pain in unspecified knee: Secondary | ICD-10-CM | POA: Diagnosis not present

## 2020-02-26 DIAGNOSIS — Z79899 Other long term (current) drug therapy: Secondary | ICD-10-CM | POA: Diagnosis not present

## 2020-02-26 DIAGNOSIS — M1611 Unilateral primary osteoarthritis, right hip: Secondary | ICD-10-CM | POA: Diagnosis not present

## 2020-02-28 NOTE — Telephone Encounter (Signed)
Pt left v/m that pt has not been properly cared for at [facility] in [place]. Pt said " he has been there 2 months and is worse than when he first got there. Pt wants to know what is the next thing to do." I called pt back and he said he was on the phone with [employer] trying to figure out what pt can do.I did not want to keep pt on phone to get more info since he was talking with [employer]. I advised pt to talk with [facility] and if still needed call [facility] back. Pt voiced understanding. FYI to Harlin Heys FNP.

## 2020-03-01 NOTE — Telephone Encounter (Signed)
Noted  

## 2020-03-08 DIAGNOSIS — M170 Bilateral primary osteoarthritis of knee: Secondary | ICD-10-CM | POA: Diagnosis not present

## 2020-03-09 ENCOUNTER — Telehealth: Payer: Self-pay

## 2020-03-09 ENCOUNTER — Other Ambulatory Visit: Payer: Self-pay

## 2020-03-09 ENCOUNTER — Ambulatory Visit: Payer: Medicare HMO

## 2020-03-09 DIAGNOSIS — M1611 Unilateral primary osteoarthritis, right hip: Secondary | ICD-10-CM | POA: Diagnosis not present

## 2020-03-09 DIAGNOSIS — M160 Bilateral primary osteoarthritis of hip: Secondary | ICD-10-CM | POA: Diagnosis not present

## 2020-03-09 DIAGNOSIS — M25551 Pain in right hip: Secondary | ICD-10-CM | POA: Diagnosis not present

## 2020-03-09 DIAGNOSIS — Y998 Other external cause status: Secondary | ICD-10-CM | POA: Diagnosis not present

## 2020-03-09 DIAGNOSIS — W19XXXA Unspecified fall, initial encounter: Secondary | ICD-10-CM | POA: Diagnosis not present

## 2020-03-09 NOTE — Telephone Encounter (Signed)
Called patient 3 times trying to complete his Medicare visit. Patient never answered. Left message on voicemail notifying patient that appointment was cancelled and to call and reschedule at earliest convenience.

## 2020-03-19 DIAGNOSIS — K5909 Other constipation: Secondary | ICD-10-CM | POA: Diagnosis not present

## 2020-03-19 DIAGNOSIS — M1990 Unspecified osteoarthritis, unspecified site: Secondary | ICD-10-CM | POA: Diagnosis not present

## 2020-03-19 DIAGNOSIS — I10 Essential (primary) hypertension: Secondary | ICD-10-CM | POA: Diagnosis not present

## 2020-03-24 ENCOUNTER — Ambulatory Visit (INDEPENDENT_AMBULATORY_CARE_PROVIDER_SITE_OTHER): Payer: Medicare HMO

## 2020-03-24 ENCOUNTER — Other Ambulatory Visit: Payer: Self-pay

## 2020-03-24 DIAGNOSIS — Z0000 Encounter for general adult medical examination without abnormal findings: Secondary | ICD-10-CM

## 2020-03-24 NOTE — Progress Notes (Signed)
Subjective:   Michael Payne is a 49 y.o. male who presents for an Initial Medicare Annual Wellness Visit.  Review of Systems: N/A      I connected with the patient today by telephone and verified that I am speaking with the correct person using two identifiers. Location patient: home Location nurse: work Persons participating in the virtual visit: patient, Engineer, civil (consulting).   I discussed the limitations, risks, security and privacy concerns of performing an evaluation and management service by telephone and the availability of in person appointments. I also discussed with the patient that there may be a patient responsible charge related to this service. The patient expressed understanding and verbally consented to this telephonic visit.    Interactive audio and video telecommunications were attempted between this nurse and patient, however failed, due to patient having technical difficulties OR patient did not have access to video capability.  We continued and completed visit with audio only.     Cardiac Risk Factors include: advanced age (>34men, >73 women);male gender;hypertension     Objective:    Today's Vitals   03/24/20 2010  PainSc: 10-Worst pain ever   There is no height or weight on file to calculate BMI.  Advanced Directives 03/24/2020 12/27/2019 07/24/2015 12/08/2014 09/30/2014  Does Patient Have a Medical Advance Directive? No No No No No  Would patient like information on creating a medical advance directive? No - Patient declined No - Patient declined No - patient declined information Yes - Educational materials given -    Current Medications (verified) Outpatient Encounter Medications as of 03/24/2020  Medication Sig  . acetaminophen (TYLENOL) 325 MG tablet Take 2 tablets (650 mg total) by mouth every 6 (six) hours as needed for mild pain, fever or headache.  . allopurinol (ZYLOPRIM) 300 MG tablet Take 1 tablet (300 mg total) by mouth 2 (two) times daily.  Marland Kitchen amLODipine (NORVASC)  10 MG tablet Take 1 tablet (10 mg total) by mouth daily.  . celecoxib (CELEBREX) 200 MG capsule TAKE 1 CAPSULE BY MOUTH 2 TIMES DAILY AS NEEDED FOR MODERATE PAIN. (Patient taking differently: Take 200 mg by mouth 2 (two) times daily as needed for moderate pain. )  . Cholecalciferol (VITAMIN D3 PO) Take 1 tablet by mouth daily.  Marland Kitchen COLCRYS 0.6 MG tablet TAKE 1 TABLET BY MOUTH DAILY AS NEEDED  . polyethylene glycol (MIRALAX / GLYCOLAX) 17 g packet Take 17 g by mouth daily.  Marland Kitchen senna-docusate (SENOKOT-S) 8.6-50 MG tablet Take 1 tablet by mouth 2 (two) times daily.  Marland Kitchen tiZANidine (ZANAFLEX) 2 MG tablet Take 2 mg by mouth 3 (three) times daily as needed for muscle spasms.    No facility-administered encounter medications on file as of 03/24/2020.    Allergies (verified) Aspirin and Lactose intolerance (gi)   History: Past Medical History:  Diagnosis Date  . Anemia    borderline anemia, was instructed to eat more iron filled foods  . GERD (gastroesophageal reflux disease)    lactose intolerance  . HTN (hypertension)    Past Surgical History:  Procedure Laterality Date  . FOOT SURGERY    . KNEE ARTHROSCOPY WITH MEDIAL MENISECTOMY Left 12/16/2014   Procedure: KNEE ARTHROSCOPY WITH MEDIAL MENISECTOMY;  Surgeon: Sheral Apley, MD;  Location: [facility] OR;  Service: Orthopedics;  Laterality: Left;   Family History  Problem Relation Age of Onset  . Heart attack Mother 68  . Lung cancer Father   . Cancer Father   . CAD Maternal Grandfather  Social History   Socioeconomic History  . Marital status: Married    Spouse name: Not on file  . Number of children: 2  . Years of education: Not on file  . Highest education level: Not on file  Occupational History  . Occupation: disabled  Tobacco Use  . Smoking status: Former Smoker    Packs/day: 2.00    Years: 22.00    Pack years: 44.00    Types: Cigarettes    Quit date: 02/24/2011    Years since quitting: 9.0  . Smokeless tobacco: Never Used    Substance and Sexual Activity  . Alcohol use: Not Currently    Comment: Weekends/beer-- has slowed down  alot  . Drug use: Not Currently    Types: Marijuana    Comment: former -  quit in 2012  . Sexual activity: Yes    Partners: Female  Other Topics Concern  . Not on file  Social History Narrative  . Not on file   Social Determinants of Health   Financial Resource Strain: Low Risk   . Difficulty of Paying Living Expenses: Not hard at all  Food Insecurity: No Food Insecurity  . Worried About Programme researcher, broadcasting/film/video in the Last Year: Never true  . Ran Out of Food in the Last Year: Never true  Transportation Needs: No Transportation Needs  . Lack of Transportation (Medical): No  . Lack of Transportation (Non-Medical): No  Physical Activity: Inactive  . Days of Exercise per Week: 0 days  . Minutes of Exercise per Session: 0 min  Stress: No Stress Concern Present  . Feeling of Stress : Not at all  Social Connections:   . Frequency of Communication with Friends and Family:   . Frequency of Social Gatherings with Friends and Family:   . Attends Religious Services:   . Active Member of Clubs or Organizations:   . Attends Banker Meetings:   Marland Kitchen Marital Status:     Tobacco Counseling Counseling given: Not Answered   Clinical Intake:  Pre-visit preparation completed: Yes  Pain : 0-10 Pain Score: 10-Worst pain ever Pain Type: Chronic pain Pain Location: Hip Pain Orientation: Right Pain Descriptors / Indicators: Aching Pain Onset: More than a month ago Pain Frequency: Intermittent     Nutritional Risks: None Diabetes: No  How often do you need to have someone help you when you read instructions, pamphlets, or other written materials from your doctor or pharmacy?: 1 - Never What is the last grade level you completed in school?: 12th  Diabetic: No Nutrition Risk Assessment:  Has the patient had any N/V/D within the last 2 months?  No  Does the patient have  any non-healing wounds?  No  Has the patient had any unintentional weight loss or weight gain?  No   Diabetes:  Is the patient diabetic?  No  If diabetic, was a CBG obtained today?  No  Did the patient bring in their glucometer from home?  No  How often do you monitor your CBG's? N/A.   Financial Strains and Diabetes Management:  Are you having any financial strains with the device, your supplies or your medication? No .  Does the patient want to be seen by Chronic Care Management for management of their diabetes?  No  Would the patient like to be referred to a Nutritionist or for Diabetic Management?  No      Interpreter Needed?: No  Information entered by :: CJohnson, LPN   Activities  of Daily Living In your present state of health, do you have any difficulty performing the following activities: 03/24/2020 12/27/2019  Hearing? N N  Vision? N N  Difficulty concentrating or making decisions? N N  Walking or climbing stairs? Y Y  Dressing or bathing? Y N  Doing errands, shopping? Y N  Preparing Food and eating ? Y -  Using the Toilet? Y -  In the past six months, have you accidently leaked urine? N -  Do you have problems with loss of bowel control? N -  Managing your Medications? Y -  Managing your Finances? Y -  Housekeeping or managing your Housekeeping? Y -  Some recent data might be hidden    Patient Care Team: Emi Belfast, FNP as PCP - General (Nurse Practitioner)  Indicate any recent Medical Services you may have received from other than [facility] providers in the past year (date may be approximate).     Assessment:   This is a routine wellness examination for [place].  Hearing/Vision screen  Hearing Screening   125Hz  250Hz  500Hz  1000Hz  2000Hz  3000Hz  4000Hz  6000Hz  8000Hz   Right ear:           Left ear:           Vision Screening Comments: Patient gets annual eye exams  Dietary issues and exercise activities discussed: Current Exercise Habits: The patient  does not participate in regular exercise at present, Exercise limited by: None identified  Goals    . Patient Stated     03/24/2020, I will maintain and continue medications as prescribed.       Depression Screen PHQ 2/9 Scores 03/24/2020 05/27/2015 02/18/2015 11/12/2014  PHQ - 2 Score 0 0 0 0  PHQ- 9 Score 0 - - -    Fall Risk Fall Risk  03/24/2020 11/09/2018 05/27/2015 11/12/2014  Falls in the past year? 1 0 No No  Number falls in past yr: 1 - - -  Injury with Fall? 0 - - -  Risk for fall due to : History of fall(s);Impaired balance/gait;Medication side effect;Impaired mobility - - -  Follow up Falls evaluation completed;Falls prevention discussed - - -    Any stairs in or around the home? Yes  If so, are there any without handrails? No  Home free of loose throw rugs in walkways, pet beds, electrical cords, etc? Yes  Adequate lighting in your home to reduce risk of falls? Yes   ASSISTIVE DEVICES UTILIZED TO PREVENT FALLS:  Life alert? No  Use of a cane, walker or w/c? yes Grab bars in the bathroom? No  Shower chair or bench in shower? No  Elevated toilet seat or a handicapped toilet? No   TIMED UP AND GO:  Was the test performed? N/A, telephonic visit.    Cognitive Function: MMSE - Mini Mental State Exam 03/24/2020  Not completed: Refused       Mini Cog  Mini-Cog screen was not completed. Patient refused. Maximum score is 22. A value of 0 denotes this part of the MMSE was not completed or the patient failed this part of the Mini-Cog screening.  Immunizations Immunization History  Administered Date(s) Administered  . Tdap 02/03/2012    TDAP status: Up to date Flu Vaccine status: due 04/2020 Pneumococcal vaccine status: N/A, due at age 17 Covid-19 vaccine status: Declined, Education has been provided regarding the importance of this vaccine but patient still declined. Advised may receive this vaccine at local pharmacy or Health Dept.or vaccine clinic.  Aware to provide a copy  of the vaccination record if obtained from local pharmacy or Health Dept. Verbalized acceptance and understanding.  Qualifies for Shingles Vaccine? No   Zostavax completed No   Shingrix Completed?: No   Screening Tests Health Maintenance  Topic Date Due  . Hepatitis C Screening  Never done  . COVID-19 Vaccine (1) 04/09/2020 (Originally 09/21/1982)  . INFLUENZA VACCINE  04/19/2020  . TETANUS/TDAP  02/02/2022  . HIV Screening  Completed    Health Maintenance  Health Maintenance Due  Topic Date Due  . Hepatitis C Screening  Never done    Colorectal cancer screening: N/A, due at age 5   Lung Cancer Screening: (Low Dose CT Chest recommended if Age 39-80 years, 30 pack-year currently smoking OR have quit w/in 15years.) does not qualify.     Additional Screening:  Hepatitis C Screening: does qualify; Completed due  Vision Screening: Recommended annual ophthalmology exams for early detection of glaucoma and other disorders of the eye. Is the patient up to date with their annual eye exam?  Yes  Who is the provider or what is the name of the office in which the patient attends annual eye exams? Doesn't remember If pt is not established with a provider, would they like to be referred to a provider to establish care? No .   Dental Screening: Recommended annual dental exams for proper oral hygiene  Community Resource Referral / Chronic Care Management: CRR required this visit?  No   CCM required this visit?  No      Plan:     I have personally reviewed and noted the following in the patient's chart:   . Medical and social history . Use of alcohol, tobacco or illicit drugs  . Current medications and supplements . Functional ability and status . Nutritional status . Physical activity . Advanced directives . List of other physicians . Hospitalizations, surgeries, and ER visits in previous 12 months . Vitals . Screenings to include cognitive, depression, and  falls . Referrals and appointments  In addition, I have reviewed and discussed with patient certain preventive protocols, quality metrics, and best practice recommendations. A written personalized care plan for preventive services as well as general preventive health recommendations were provided to patient.   Due to this being a telephonic visit, the after visit summary with patients personalized plan was offered to patient via mail or my-chart. Patient preferred to pick up at office at next visit.   Janalyn Shy, LPN   0/03/1218

## 2020-03-24 NOTE — Progress Notes (Signed)
PCP notes:  Health Maintenance: COVID- declined   Abnormal Screenings: none   Patient concerns: None   Nurse concerns: none   Next PCP appt.: none

## 2020-03-24 NOTE — Patient Instructions (Signed)
[name] , Thank you for taking time to come for your Medicare Wellness Visit. I appreciate your ongoing commitment to your health goals. Please review the following plan we discussed and let me know if I can assist you in the future.   Screening recommendations/referrals: Colonoscopy: at ge 50 Recommended yearly ophthalmology/optometry visit for glaucoma screening and checkup Recommended yearly dental visit for hygiene and checkup  Vaccinations: Influenza vaccine: due 04/2020 Pneumococcal vaccine: at age 24 Tdap vaccine: Up to date, completed 02/03/2012, due 01/2022 Shingles vaccine: at age 4   COVID vaccine: declined   Advanced directives: Advance directive discussed with you today. Even though you declined this today please call our office should you change your mind and we can give you the proper paperwork for you to fill out.   Conditions/risks identified: hypertension  Next appointment: schedule next AWV in 1 year   Preventive Care 40-64 Years, Male Preventive care refers to lifestyle choices and visits with your health care provider that can promote health and wellness. What does preventive care include?  A yearly physical exam. This is also called an annual well check.  Dental exams once or twice a year.  Routine eye exams. Ask your health care provider how often you should have your eyes checked.  Personal lifestyle choices, including:  Daily care of your teeth and gums.  Regular physical activity.  Eating a healthy diet.  Avoiding tobacco and drug use.  Limiting alcohol use.  Practicing safe sex.  Taking low-dose aspirin every day starting at age 29. What happens during an annual well check? The services and screenings done by your health care provider during your annual well check will depend on your age, overall health, lifestyle risk factors, and family history of disease. Counseling  Your health care provider may ask you questions about your:  Alcohol  use.  Tobacco use.  Drug use.  Emotional well-being.  Home and relationship well-being.  Sexual activity.  Eating habits.  Work and work Astronomer. Screening  You may have the following tests or measurements:  Height, weight, and BMI.  Blood pressure.  Lipid and cholesterol levels. These may be checked every 5 years, or more frequently if you are over 71 years old.  Skin check.  Lung cancer screening. You may have this screening every year starting at age 67 if you have a 30-pack-year history of smoking and currently smoke or have quit within the past 15 years.  Fecal occult blood test (FOBT) of the stool. You may have this test every year starting at age 73.  Flexible sigmoidoscopy or colonoscopy. You may have a sigmoidoscopy every 5 years or a colonoscopy every 10 years starting at age 22.  Prostate cancer screening. Recommendations will vary depending on your family history and other risks.  Hepatitis C blood test.  Hepatitis B blood test.  Sexually transmitted disease (STD) testing.  Diabetes screening. This is done by checking your blood sugar (glucose) after you have not eaten for a while (fasting). You may have this done every 1-3 years. Discuss your test results, treatment options, and if necessary, the need for more tests with your health care provider. Vaccines  Your health care provider may recommend certain vaccines, such as:  Influenza vaccine. This is recommended every year.  Tetanus, diphtheria, and acellular pertussis (Tdap, Td) vaccine. You may need a Td booster every 10 years.  Zoster vaccine. You may need this after age 50.  Pneumococcal 13-valent conjugate (PCV13) vaccine. You may need this  if you have certain conditions and have not been vaccinated.  Pneumococcal polysaccharide (PPSV23) vaccine. You may need one or two doses if you smoke cigarettes or if you have certain conditions. Talk to your health care provider about which screenings  and vaccines you need and how often you need them. This information is not intended to replace advice given to you by your health care provider. Make sure you discuss any questions you have with your health care provider. Document Released: 10/02/2015 Document Revised: 05/25/2016 Document Reviewed: 07/07/2015 Elsevier Interactive Patient Education  2017 [employer].  Fall Prevention in the Home Falls can cause injuries. They can happen to people of all ages. There are many things you can do to make your home safe and to help prevent falls. What can I do on the outside of my home?  Regularly fix the edges of walkways and driveways and fix any cracks.  Remove anything that might make you trip as you walk through a door, such as a raised step or threshold.  Trim any bushes or trees on the path to your home.  Use bright outdoor lighting.  Clear any walking paths of anything that might make someone trip, such as rocks or tools.  Regularly check to see if handrails are loose or broken. Make sure that both sides of any steps have handrails.  Any raised decks and porches should have guardrails on the edges.  Have any leaves, snow, or ice cleared regularly.  Use sand or salt on walking paths during winter.  Clean up any spills in your garage right away. This includes oil or grease spills. What can I do in the bathroom?  Use night lights.  Install grab bars by the toilet and in the tub and shower. Do not use towel bars as grab bars.  Use non-skid mats or decals in the tub or shower.  If you need to sit down in the shower, use a plastic, non-slip stool.  Keep the floor dry. Clean up any water that spills on the floor as soon as it happens.  Remove soap buildup in the tub or shower regularly.  Attach bath mats securely with double-sided non-slip rug tape.  Do not have throw rugs and other things on the floor that can make you trip. What can I do in the bedroom?  Use night  lights.  Make sure that you have a light by your bed that is easy to reach.  Do not use any sheets or blankets that are too big for your bed. They should not hang down onto the floor.  Have a firm chair that has side arms. You can use this for support while you get dressed.  Do not have throw rugs and other things on the floor that can make you trip. What can I do in the kitchen?  Clean up any spills right away.  Avoid walking on wet floors.  Keep items that you use a lot in easy-to-reach places.  If you need to reach something above you, use a strong step stool that has a grab bar.  Keep electrical cords out of the way.  Do not use floor polish or wax that makes floors slippery. If you must use wax, use non-skid floor wax.  Do not have throw rugs and other things on the floor that can make you trip. What can I do with my stairs?  Do not leave any items on the stairs.  Make sure that there are handrails on  both sides of the stairs and use them. Fix handrails that are broken or loose. Make sure that handrails are as long as the stairways.  Check any carpeting to make sure that it is firmly attached to the stairs. Fix any carpet that is loose or worn.  Avoid having throw rugs at the top or bottom of the stairs. If you do have throw rugs, attach them to the floor with carpet tape.  Make sure that you have a light switch at the top of the stairs and the bottom of the stairs. If you do not have them, ask someone to add them for you. What else can I do to help prevent falls?  Wear shoes that:  Do not have high heels.  Have rubber bottoms.  Are comfortable and fit you well.  Are closed at the toe. Do not wear sandals.  If you use a stepladder:  Make sure that it is fully opened. Do not climb a closed stepladder.  Make sure that both sides of the stepladder are locked into place.  Ask someone to hold it for you, if possible.  Clearly mark and make sure that you can  see:  Any grab bars or handrails.  First and last steps.  Where the edge of each step is.  Use tools that help you move around (mobility aids) if they are needed. These include:  Canes.  Walkers.  Scooters.  Crutches.  Turn on the lights when you go into a dark area. Replace any light bulbs as soon as they burn out.  Set up your furniture so you have a clear path. Avoid moving your furniture around.  If any of your floors are uneven, fix them.  If there are any pets around you, be aware of where they are.  Review your medicines with your doctor. Some medicines can make you feel dizzy. This can increase your chance of falling. Ask your doctor what other things that you can do to help prevent falls. This information is not intended to replace advice given to you by your health care provider. Make sure you discuss any questions you have with your health care provider. Document Released: 07/02/2009 Document Revised: 02/11/2016 Document Reviewed: 10/10/2014 Elsevier Interactive Patient Education  2017 [employer].

## 2020-03-25 DIAGNOSIS — R0602 Shortness of breath: Secondary | ICD-10-CM | POA: Diagnosis not present

## 2020-03-25 DIAGNOSIS — M25551 Pain in right hip: Secondary | ICD-10-CM | POA: Diagnosis not present

## 2020-03-25 DIAGNOSIS — M2550 Pain in unspecified joint: Secondary | ICD-10-CM | POA: Diagnosis not present

## 2020-03-25 DIAGNOSIS — R531 Weakness: Secondary | ICD-10-CM | POA: Diagnosis not present

## 2020-03-25 DIAGNOSIS — Z7401 Bed confinement status: Secondary | ICD-10-CM | POA: Diagnosis not present

## 2020-04-06 DIAGNOSIS — K219 Gastro-esophageal reflux disease without esophagitis: Secondary | ICD-10-CM | POA: Diagnosis not present

## 2020-04-06 DIAGNOSIS — I10 Essential (primary) hypertension: Secondary | ICD-10-CM | POA: Diagnosis not present

## 2020-04-06 DIAGNOSIS — M1611 Unilateral primary osteoarthritis, right hip: Secondary | ICD-10-CM | POA: Diagnosis not present

## 2020-04-06 DIAGNOSIS — D649 Anemia, unspecified: Secondary | ICD-10-CM | POA: Diagnosis not present

## 2020-04-08 DIAGNOSIS — M1611 Unilateral primary osteoarthritis, right hip: Secondary | ICD-10-CM | POA: Diagnosis not present

## 2020-04-08 DIAGNOSIS — D649 Anemia, unspecified: Secondary | ICD-10-CM | POA: Diagnosis not present

## 2020-04-09 ENCOUNTER — Other Ambulatory Visit: Payer: Self-pay | Admitting: Family Medicine

## 2020-04-09 DIAGNOSIS — M62838 Other muscle spasm: Secondary | ICD-10-CM | POA: Diagnosis not present

## 2020-04-09 DIAGNOSIS — M1611 Unilateral primary osteoarthritis, right hip: Secondary | ICD-10-CM | POA: Diagnosis not present

## 2020-04-09 DIAGNOSIS — G4733 Obstructive sleep apnea (adult) (pediatric): Secondary | ICD-10-CM | POA: Diagnosis not present

## 2020-04-09 DIAGNOSIS — R5381 Other malaise: Secondary | ICD-10-CM | POA: Diagnosis not present

## 2020-04-10 DIAGNOSIS — D649 Anemia, unspecified: Secondary | ICD-10-CM | POA: Diagnosis not present

## 2020-04-10 DIAGNOSIS — M150 Primary generalized (osteo)arthritis: Secondary | ICD-10-CM | POA: Diagnosis not present

## 2020-04-10 DIAGNOSIS — G4733 Obstructive sleep apnea (adult) (pediatric): Secondary | ICD-10-CM | POA: Diagnosis not present

## 2020-04-10 DIAGNOSIS — K219 Gastro-esophageal reflux disease without esophagitis: Secondary | ICD-10-CM | POA: Diagnosis not present

## 2020-04-10 DIAGNOSIS — Z9181 History of falling: Secondary | ICD-10-CM | POA: Diagnosis not present

## 2020-04-10 DIAGNOSIS — Z6841 Body Mass Index (BMI) 40.0 and over, adult: Secondary | ICD-10-CM | POA: Diagnosis not present

## 2020-04-10 DIAGNOSIS — M6281 Muscle weakness (generalized): Secondary | ICD-10-CM | POA: Diagnosis not present

## 2020-04-10 DIAGNOSIS — I10 Essential (primary) hypertension: Secondary | ICD-10-CM | POA: Diagnosis not present

## 2020-04-10 DIAGNOSIS — Z87891 Personal history of nicotine dependence: Secondary | ICD-10-CM | POA: Diagnosis not present

## 2020-04-13 ENCOUNTER — Telehealth: Payer: Self-pay | Admitting: Family Medicine

## 2020-04-13 NOTE — Telephone Encounter (Signed)
Okay to give verbal orders?  ?

## 2020-04-13 NOTE — Telephone Encounter (Signed)
Flor @ [facility] PT Wanted to get verbal order  1 x week for 4 weeks  PT  [place] aid service 1 x week for 4 weeks   Referral to their social work for eval

## 2020-04-14 NOTE — Telephone Encounter (Signed)
Ok for verbal orders for PT.

## 2020-04-14 NOTE — Telephone Encounter (Signed)
Verbal orders given to [facility] via telephone for  PT 1 x week for 4 weeks   Heath aid service 1 x week for 4 weeks   Referral to their social work for evaluation per Dr. Selena Batten.

## 2020-04-16 DIAGNOSIS — G4733 Obstructive sleep apnea (adult) (pediatric): Secondary | ICD-10-CM | POA: Diagnosis not present

## 2020-04-16 DIAGNOSIS — D649 Anemia, unspecified: Secondary | ICD-10-CM | POA: Diagnosis not present

## 2020-04-16 DIAGNOSIS — M150 Primary generalized (osteo)arthritis: Secondary | ICD-10-CM | POA: Diagnosis not present

## 2020-04-16 DIAGNOSIS — Z6841 Body Mass Index (BMI) 40.0 and over, adult: Secondary | ICD-10-CM | POA: Diagnosis not present

## 2020-04-16 DIAGNOSIS — I10 Essential (primary) hypertension: Secondary | ICD-10-CM | POA: Diagnosis not present

## 2020-04-16 DIAGNOSIS — Z87891 Personal history of nicotine dependence: Secondary | ICD-10-CM | POA: Diagnosis not present

## 2020-04-16 DIAGNOSIS — K219 Gastro-esophageal reflux disease without esophagitis: Secondary | ICD-10-CM | POA: Diagnosis not present

## 2020-04-16 DIAGNOSIS — Z9181 History of falling: Secondary | ICD-10-CM | POA: Diagnosis not present

## 2020-04-17 ENCOUNTER — Telehealth: Payer: Self-pay

## 2020-04-17 DIAGNOSIS — K219 Gastro-esophageal reflux disease without esophagitis: Secondary | ICD-10-CM | POA: Diagnosis not present

## 2020-04-17 DIAGNOSIS — M150 Primary generalized (osteo)arthritis: Secondary | ICD-10-CM | POA: Diagnosis not present

## 2020-04-17 DIAGNOSIS — G4733 Obstructive sleep apnea (adult) (pediatric): Secondary | ICD-10-CM | POA: Diagnosis not present

## 2020-04-17 DIAGNOSIS — I10 Essential (primary) hypertension: Secondary | ICD-10-CM | POA: Diagnosis not present

## 2020-04-17 DIAGNOSIS — Z87891 Personal history of nicotine dependence: Secondary | ICD-10-CM | POA: Diagnosis not present

## 2020-04-17 DIAGNOSIS — Z9181 History of falling: Secondary | ICD-10-CM | POA: Diagnosis not present

## 2020-04-17 DIAGNOSIS — D649 Anemia, unspecified: Secondary | ICD-10-CM | POA: Diagnosis not present

## 2020-04-17 DIAGNOSIS — Z6841 Body Mass Index (BMI) 40.0 and over, adult: Secondary | ICD-10-CM | POA: Diagnosis not present

## 2020-04-17 NOTE — Telephone Encounter (Signed)
Gar Gibbon Trammel social worker with [facility] left v/m as Lorain Childes that the social worker eval will be delayed until mid next wk. FYI only and no cb needed.

## 2020-04-18 ENCOUNTER — Other Ambulatory Visit: Payer: Self-pay | Admitting: Family Medicine

## 2020-04-18 DIAGNOSIS — M25551 Pain in right hip: Secondary | ICD-10-CM

## 2020-04-20 DIAGNOSIS — K219 Gastro-esophageal reflux disease without esophagitis: Secondary | ICD-10-CM | POA: Diagnosis not present

## 2020-04-20 DIAGNOSIS — Z87891 Personal history of nicotine dependence: Secondary | ICD-10-CM | POA: Diagnosis not present

## 2020-04-20 DIAGNOSIS — M150 Primary generalized (osteo)arthritis: Secondary | ICD-10-CM | POA: Diagnosis not present

## 2020-04-20 DIAGNOSIS — D649 Anemia, unspecified: Secondary | ICD-10-CM | POA: Diagnosis not present

## 2020-04-20 DIAGNOSIS — Z9181 History of falling: Secondary | ICD-10-CM | POA: Diagnosis not present

## 2020-04-20 DIAGNOSIS — Z6841 Body Mass Index (BMI) 40.0 and over, adult: Secondary | ICD-10-CM | POA: Diagnosis not present

## 2020-04-20 DIAGNOSIS — I10 Essential (primary) hypertension: Secondary | ICD-10-CM | POA: Diagnosis not present

## 2020-04-20 DIAGNOSIS — G4733 Obstructive sleep apnea (adult) (pediatric): Secondary | ICD-10-CM | POA: Diagnosis not present

## 2020-04-20 NOTE — Telephone Encounter (Signed)
Noted  

## 2020-04-21 DIAGNOSIS — G4733 Obstructive sleep apnea (adult) (pediatric): Secondary | ICD-10-CM | POA: Diagnosis not present

## 2020-04-21 DIAGNOSIS — K219 Gastro-esophageal reflux disease without esophagitis: Secondary | ICD-10-CM | POA: Diagnosis not present

## 2020-04-21 DIAGNOSIS — Z87891 Personal history of nicotine dependence: Secondary | ICD-10-CM | POA: Diagnosis not present

## 2020-04-21 DIAGNOSIS — Z9181 History of falling: Secondary | ICD-10-CM | POA: Diagnosis not present

## 2020-04-21 DIAGNOSIS — Z6841 Body Mass Index (BMI) 40.0 and over, adult: Secondary | ICD-10-CM | POA: Diagnosis not present

## 2020-04-21 DIAGNOSIS — I10 Essential (primary) hypertension: Secondary | ICD-10-CM | POA: Diagnosis not present

## 2020-04-21 DIAGNOSIS — D649 Anemia, unspecified: Secondary | ICD-10-CM | POA: Diagnosis not present

## 2020-04-21 DIAGNOSIS — M150 Primary generalized (osteo)arthritis: Secondary | ICD-10-CM | POA: Diagnosis not present

## 2020-04-22 ENCOUNTER — Other Ambulatory Visit: Payer: Self-pay

## 2020-04-22 ENCOUNTER — Telehealth: Payer: Self-pay | Admitting: *Deleted

## 2020-04-22 ENCOUNTER — Telehealth (INDEPENDENT_AMBULATORY_CARE_PROVIDER_SITE_OTHER): Payer: Medicare HMO | Admitting: Family Medicine

## 2020-04-22 ENCOUNTER — Telehealth: Payer: Self-pay | Admitting: Family Medicine

## 2020-04-22 ENCOUNTER — Encounter: Payer: Self-pay | Admitting: Family Medicine

## 2020-04-22 VITALS — Ht 76.0 in | Wt >= 6400 oz

## 2020-04-22 DIAGNOSIS — Z7401 Bed confinement status: Secondary | ICD-10-CM

## 2020-04-22 DIAGNOSIS — G8929 Other chronic pain: Secondary | ICD-10-CM

## 2020-04-22 DIAGNOSIS — M25551 Pain in right hip: Secondary | ICD-10-CM

## 2020-04-22 DIAGNOSIS — M150 Primary generalized (osteo)arthritis: Secondary | ICD-10-CM | POA: Diagnosis not present

## 2020-04-22 DIAGNOSIS — Z6841 Body Mass Index (BMI) 40.0 and over, adult: Secondary | ICD-10-CM | POA: Diagnosis not present

## 2020-04-22 DIAGNOSIS — D649 Anemia, unspecified: Secondary | ICD-10-CM | POA: Diagnosis not present

## 2020-04-22 DIAGNOSIS — Z9181 History of falling: Secondary | ICD-10-CM | POA: Diagnosis not present

## 2020-04-22 DIAGNOSIS — K219 Gastro-esophageal reflux disease without esophagitis: Secondary | ICD-10-CM | POA: Diagnosis not present

## 2020-04-22 DIAGNOSIS — Z87891 Personal history of nicotine dependence: Secondary | ICD-10-CM | POA: Diagnosis not present

## 2020-04-22 DIAGNOSIS — G4733 Obstructive sleep apnea (adult) (pediatric): Secondary | ICD-10-CM | POA: Diagnosis not present

## 2020-04-22 DIAGNOSIS — I10 Essential (primary) hypertension: Secondary | ICD-10-CM | POA: Diagnosis not present

## 2020-04-22 MED ORDER — DICLOFENAC SODIUM 75 MG PO TBEC: 75.0000 mg | DELAYED_RELEASE_TABLET | Freq: Two times a day (BID) | ORAL | 1 refills | Status: DC | PRN

## 2020-04-22 MED ORDER — LIDOCAINE 5 % EX PTCH: 1.0000 | MEDICATED_PATCH | CUTANEOUS | 2 refills | Status: DC

## 2020-04-22 NOTE — Telephone Encounter (Signed)
Called and spoke with [facility] social worker Kandace Blitz at [phone] regarding patient.  I had virtual visit with patient earlier today and to there were some concerns.  Discussed with Annmarie and she is currently trying to coordinate ambulance transportation for patient to go to appointments.  She will also reach out to PT and OT regarding visits allowed.  She is working to get a care aide in to help with ADLs.  She did not know of any place where he would be able to do intensive inpatient rehabilitation as she thinks that those can only be accessed while in the hospital.  She is also going to discuss his hospital bed with occupational therapist.

## 2020-04-22 NOTE — Telephone Encounter (Signed)
Received fax from [facility] requesting PA for Lidocaine 5% patches.  PA completed on CoverMyMeds and sent to [facility] for review.  Can take up to 72 hours for a decision.

## 2020-04-22 NOTE — Progress Notes (Signed)
Virtual Visit via Video Note  I connected with Michael Payne on 04/22/20 at 11:00 AM EDT by a video enabled telemedicine application and verified that I am speaking with the correct person using two identifiers.  Location: Patient: In his home Provider: [facility] Persons participating in virtual visit: Tirth Cothron, his wife Foday [facility] and provider, Olean Ree.   I discussed the limitations of evaluation and management by telemedicine and the availability of in person appointments. The patient expressed understanding and agreed to proceed.  History of Present Illness: Chief Complaint  Patient presents with  . Follow-up    2 week    This is a 49 year old male who presents today for video visit for follow-up of discharge from skilled nursing facility.  The patient was admitted to the hospital from 12/27/2019 through 01/03/2020.  He was then discharged charge to skilled nursing facility where he stayed until 04/08/2020.  He reports that when he went into the facility currently, he was able to walk about 60 feet with his walker.  His mobility has decreased significantly and he is currently bedbound, unable to get out of bed.  While he was in the hospital, he was seen by orthopedist and found to have severe osteoarthritis.  His morbid obesity prohibits him from having surgery.  He had an injection with some improvement of pain.  Prior to his hospital admission, he was being seen at a pain clinic for management of his chronic hip and knee pain. He is currently having [facility] services [facility].  Physical therapy and Occupational Therapy are seeing him daily and he has occasional visits from CNA to help with ADLs.  The social worker on his case, Mirian Capuchin, is working to get more assistance and to help with ADLs. The patient reports that he is having a lot of muscle spasms.  Pain controlled for short period of time on oxycodone 10 mg tablet that he is able to take every 6  hours.  He is also been taking Celebrex twice a day.  He does not think this helps with his hip pain but does think it helps with his chronic back and knee pain.  He does not feel like he is getting relief from his muscle spasms with the tizanidine.  Morbid obesity-there is no recent weight available.  He reports that he ate very little while in the skilled nursing facility, eating oatmeal for breakfast and salads for dinner and lunch.  He reports that currently he is eating instant oatmeal in the morning, chicken, vegetables and salads for lunch and dinner without snacking.  He has been through [facility] health bariatric program in the past but was never able to get his weight low enough for surgery.   Observations/Objective: The patient is lying in bed.  He is alert and answers questions appropriately.  Visible skin is unremarkable.  Respirations are even and unlabored.  Mood and affect are appropriate.  Assessment and Plan: 1. Chronic right hip pain - unfortunately, there are not many options with patient in current state- he has an appointment with orthopedics later this month. I discussed with HH social worker who is working on getting him transportation - lidocaine (LIDODERM) 5 %; Place 1 patch onto the skin daily. Remove & Discard patch within 12 hours  Dispense: 30 patch; Refill: 2 - diclofenac (VOLTAREN) 75 MG EC tablet; Take 1 tablet (75 mg total) by mouth 2 (two) times daily as needed. Take instead of  celecoxib  Dispense: 60 tablet; Refill: 1  2. Bedbound - having problems with his bed, can't raise head, will have OT/PT take a look, important for him to be able to raise his head adequately for position changes and to facilitate sitting up on side of bed and eventually getting out of bed - discussed with social worker regarding need for more intensive PT and she is looking into this with care team - there do not seem to be more options as far as inpatient rehab since he can not  get admitted from home and would have to be out of SNF 60 days prior to readmission  3. Morbid obesity (HCC) - per 2/20 notes from bariatrics at [facility], patient was permanently dismissed from practice following refusal to have random drug testing.  - he continues to consume foods not conducive to weight loss, discussed diet with he and his wife, no calories consumed in beverages unless protein drinks, no refined carbs, encouraged proteins, non starchy vegetables  - follow up in 8 weeks  Over 40 minutes were spent face-to-face with the patient during this encounter and >50% of that time was spent on counseling and coordination of care  Olean Ree, FNP-BC  [facility], [facility]  04/22/2020 5:29 PM   Follow Up Instructions:    I discussed the assessment and treatment plan with the patient. The patient was provided an opportunity to questions and all were answered. The patient agreed with the plan and demonstrated an understanding of the instructions.   The patient was advised to call back or seek an in-person evaluation if the symptoms worsen or if the condition fails to improve as anticipated.   Emi Belfast, FNP

## 2020-04-23 DIAGNOSIS — G4733 Obstructive sleep apnea (adult) (pediatric): Secondary | ICD-10-CM | POA: Diagnosis not present

## 2020-04-23 DIAGNOSIS — D649 Anemia, unspecified: Secondary | ICD-10-CM | POA: Diagnosis not present

## 2020-04-23 DIAGNOSIS — Z87891 Personal history of nicotine dependence: Secondary | ICD-10-CM | POA: Diagnosis not present

## 2020-04-23 DIAGNOSIS — Z9181 History of falling: Secondary | ICD-10-CM | POA: Diagnosis not present

## 2020-04-23 DIAGNOSIS — M150 Primary generalized (osteo)arthritis: Secondary | ICD-10-CM | POA: Diagnosis not present

## 2020-04-23 DIAGNOSIS — K219 Gastro-esophageal reflux disease without esophagitis: Secondary | ICD-10-CM | POA: Diagnosis not present

## 2020-04-23 DIAGNOSIS — Z6841 Body Mass Index (BMI) 40.0 and over, adult: Secondary | ICD-10-CM | POA: Diagnosis not present

## 2020-04-23 DIAGNOSIS — I10 Essential (primary) hypertension: Secondary | ICD-10-CM | POA: Diagnosis not present

## 2020-04-23 NOTE — Telephone Encounter (Signed)
Fax from [facility] reports PA for Lidocaine 5% patch has been approved and is good until 09/18/2020.  Fax has been placed in the scan folder.

## 2020-04-27 ENCOUNTER — Telehealth: Payer: Self-pay | Admitting: *Deleted

## 2020-04-27 NOTE — Telephone Encounter (Signed)
Please call patient and find out what medication he is needing to be refilled? I am not going to take over pain medication. If he needs a referral to new pain management provider, let me know. Please ask him if he was able to get his hospital bed fixed?

## 2020-04-27 NOTE — Telephone Encounter (Signed)
Patient left a voicemail wanting to know if Deboraha Sprang NP is going to be refilling all of his medications for him.

## 2020-04-27 NOTE — Telephone Encounter (Signed)
Please call patient and tell him that I do not see a record of him receiving tramadol. I recommend that he call his prior pain provider, Dr. Tollie Eth at [phone] as he is still open in [place].  Ask him if he knows the company who delivered his hospital bed?

## 2020-04-27 NOTE — Telephone Encounter (Signed)
Patient contacted the office and states that he needs a refill on his Oxycodone and Tramadol. I did advise that we would need to place a referral to a new pain management provider, and he verbalized understanding.   Patient states that he has not has his hospital bed fixed. He states that the facility he was just discharged from (he could not recall the name of the facility) set something up for someone to come in and fix it for him, but no one has showed up.  He does not know who to reach out to to help him with this. Please advise.

## 2020-04-28 DIAGNOSIS — Z87891 Personal history of nicotine dependence: Secondary | ICD-10-CM | POA: Diagnosis not present

## 2020-04-28 DIAGNOSIS — Z9181 History of falling: Secondary | ICD-10-CM | POA: Diagnosis not present

## 2020-04-28 DIAGNOSIS — K219 Gastro-esophageal reflux disease without esophagitis: Secondary | ICD-10-CM | POA: Diagnosis not present

## 2020-04-28 DIAGNOSIS — D649 Anemia, unspecified: Secondary | ICD-10-CM | POA: Diagnosis not present

## 2020-04-28 DIAGNOSIS — G4733 Obstructive sleep apnea (adult) (pediatric): Secondary | ICD-10-CM | POA: Diagnosis not present

## 2020-04-28 DIAGNOSIS — Z6841 Body Mass Index (BMI) 40.0 and over, adult: Secondary | ICD-10-CM | POA: Diagnosis not present

## 2020-04-28 DIAGNOSIS — I10 Essential (primary) hypertension: Secondary | ICD-10-CM | POA: Diagnosis not present

## 2020-04-28 DIAGNOSIS — M150 Primary generalized (osteo)arthritis: Secondary | ICD-10-CM | POA: Diagnosis not present

## 2020-04-28 NOTE — Telephone Encounter (Signed)
I left VM for patient to return phone call.   

## 2020-04-29 ENCOUNTER — Other Ambulatory Visit: Payer: Self-pay | Admitting: Family Medicine

## 2020-04-29 DIAGNOSIS — D649 Anemia, unspecified: Secondary | ICD-10-CM | POA: Diagnosis not present

## 2020-04-29 DIAGNOSIS — Z9181 History of falling: Secondary | ICD-10-CM | POA: Diagnosis not present

## 2020-04-29 DIAGNOSIS — K219 Gastro-esophageal reflux disease without esophagitis: Secondary | ICD-10-CM | POA: Diagnosis not present

## 2020-04-29 DIAGNOSIS — Z87891 Personal history of nicotine dependence: Secondary | ICD-10-CM | POA: Diagnosis not present

## 2020-04-29 DIAGNOSIS — Z6841 Body Mass Index (BMI) 40.0 and over, adult: Secondary | ICD-10-CM | POA: Diagnosis not present

## 2020-04-29 DIAGNOSIS — M150 Primary generalized (osteo)arthritis: Secondary | ICD-10-CM | POA: Diagnosis not present

## 2020-04-29 DIAGNOSIS — I10 Essential (primary) hypertension: Secondary | ICD-10-CM | POA: Diagnosis not present

## 2020-04-29 DIAGNOSIS — G4733 Obstructive sleep apnea (adult) (pediatric): Secondary | ICD-10-CM | POA: Diagnosis not present

## 2020-04-29 MED ORDER — TIZANIDINE HCL 2 MG PO TABS: 2.0000 mg | ORAL_TABLET | Freq: Two times a day (BID) | ORAL | 1 refills | Status: DC | PRN

## 2020-04-29 NOTE — Telephone Encounter (Signed)
Please call patient and let him know that I have sent in a refill of his tizanidine to his pharmacy. I will not take over his narcotic pain medication prescribing. Dr. Tollie Eth is still open and his number is [phone]. Has the occupational or physical therapist looked at his bed? I am not sure how to help him with this. He needs to try to figure out where it came from or see if home health is able to help him.

## 2020-04-29 NOTE — Telephone Encounter (Signed)
Notified pt Rx Tizanidine been sent to the pharmacy  Pt stated --will try to call Dr. Tollie Eth [phone] and tell his wife to find the paper information for the bed and will call the office back.

## 2020-04-29 NOTE — Telephone Encounter (Signed)
Pt stated-- Dr. Tollie Eth office closed down and cannot remember company who delivered his hospital bed. Pt requesting Rx Tizanadine and Oxycodone 10 mg. Please advise

## 2020-04-30 ENCOUNTER — Other Ambulatory Visit: Payer: Self-pay | Admitting: Family Medicine

## 2020-04-30 DIAGNOSIS — M1611 Unilateral primary osteoarthritis, right hip: Secondary | ICD-10-CM

## 2020-04-30 DIAGNOSIS — M25551 Pain in right hip: Secondary | ICD-10-CM

## 2020-04-30 DIAGNOSIS — F4321 Adjustment disorder with depressed mood: Secondary | ICD-10-CM | POA: Diagnosis not present

## 2020-04-30 DIAGNOSIS — G8929 Other chronic pain: Secondary | ICD-10-CM

## 2020-04-30 MED ORDER — OXYCODONE-ACETAMINOPHEN 10-325 MG PO TABS: 1.0000 | ORAL_TABLET | Freq: Four times a day (QID) | ORAL | 0 refills | Status: DC | PRN

## 2020-04-30 NOTE — Telephone Encounter (Signed)
Pt said that he had contacted Dr Plummer's office and they are no longer taking care of pain mgt pts. Pt has one more day of oxycodone 10 mg left/ pt is requesting a referral to another pain mgt clinic; pt request refill of oxycodone 10 mg and pt taking one tab q 6 h prn for pain. Pt had video visit on 04/22/20. Pt request cb after review from Harlin Heys FNP. [facility] [address]

## 2020-04-30 NOTE — Telephone Encounter (Signed)
Please call patient and tell him that I did verify that Dr. Tollie Eth is no longer doing pain management.  I have sent him in a 1 month supply of pain medication to his pharmacy and put in a referral to pain management.  I would like him to continue to alternate medications and work to decrease use of oxycodone from 4 times a day to 3 times a day.  Please ask him if physical therapy is still coming out?  Has he made any progress on getting his bed fixed?

## 2020-05-01 DIAGNOSIS — Z6841 Body Mass Index (BMI) 40.0 and over, adult: Secondary | ICD-10-CM | POA: Diagnosis not present

## 2020-05-01 DIAGNOSIS — Z87891 Personal history of nicotine dependence: Secondary | ICD-10-CM | POA: Diagnosis not present

## 2020-05-01 DIAGNOSIS — K219 Gastro-esophageal reflux disease without esophagitis: Secondary | ICD-10-CM | POA: Diagnosis not present

## 2020-05-01 DIAGNOSIS — Z9181 History of falling: Secondary | ICD-10-CM | POA: Diagnosis not present

## 2020-05-01 DIAGNOSIS — M150 Primary generalized (osteo)arthritis: Secondary | ICD-10-CM | POA: Diagnosis not present

## 2020-05-01 DIAGNOSIS — D649 Anemia, unspecified: Secondary | ICD-10-CM | POA: Diagnosis not present

## 2020-05-01 DIAGNOSIS — I10 Essential (primary) hypertension: Secondary | ICD-10-CM | POA: Diagnosis not present

## 2020-05-01 DIAGNOSIS — G4733 Obstructive sleep apnea (adult) (pediatric): Secondary | ICD-10-CM | POA: Diagnosis not present

## 2020-05-01 NOTE — Telephone Encounter (Signed)
NOted

## 2020-05-01 NOTE — Telephone Encounter (Signed)
Notified pt ---Oxycodone been sent to the pharmacy with the  instructions. Pt mention Physical therapy come to his home 1x a week and pt bed is not fix yet but willing to look for the paper work and will call the office back with the info.

## 2020-05-04 ENCOUNTER — Telehealth: Payer: Self-pay | Admitting: *Deleted

## 2020-05-04 DIAGNOSIS — M150 Primary generalized (osteo)arthritis: Secondary | ICD-10-CM | POA: Diagnosis not present

## 2020-05-04 DIAGNOSIS — Z6841 Body Mass Index (BMI) 40.0 and over, adult: Secondary | ICD-10-CM | POA: Diagnosis not present

## 2020-05-04 DIAGNOSIS — I10 Essential (primary) hypertension: Secondary | ICD-10-CM | POA: Diagnosis not present

## 2020-05-04 DIAGNOSIS — Z9181 History of falling: Secondary | ICD-10-CM | POA: Diagnosis not present

## 2020-05-04 DIAGNOSIS — D649 Anemia, unspecified: Secondary | ICD-10-CM | POA: Diagnosis not present

## 2020-05-04 DIAGNOSIS — Z87891 Personal history of nicotine dependence: Secondary | ICD-10-CM | POA: Diagnosis not present

## 2020-05-04 DIAGNOSIS — G4733 Obstructive sleep apnea (adult) (pediatric): Secondary | ICD-10-CM | POA: Diagnosis not present

## 2020-05-04 DIAGNOSIS — K219 Gastro-esophageal reflux disease without esophagitis: Secondary | ICD-10-CM | POA: Diagnosis not present

## 2020-05-04 NOTE — Telephone Encounter (Signed)
Pended prescription cancelled.

## 2020-05-04 NOTE — Telephone Encounter (Signed)
Michael Payne with [facility] left a voicemail requesting to extend PT for once a week for 5 weeks. Orders can be left on her secured voicemail.

## 2020-05-04 NOTE — Telephone Encounter (Signed)
Called home health physical therapist and left okay for verbal orders for extension of physical therapy.  I have also asked her to give me a call to discuss patient's progress and left her my cell phone number.

## 2020-05-04 NOTE — Telephone Encounter (Signed)
Please address the pending prescription. If script has already been sent to the pharmacy it will need to be refused and signed.

## 2020-05-05 DIAGNOSIS — Z9181 History of falling: Secondary | ICD-10-CM | POA: Diagnosis not present

## 2020-05-05 DIAGNOSIS — G4733 Obstructive sleep apnea (adult) (pediatric): Secondary | ICD-10-CM | POA: Diagnosis not present

## 2020-05-05 DIAGNOSIS — M150 Primary generalized (osteo)arthritis: Secondary | ICD-10-CM | POA: Diagnosis not present

## 2020-05-05 DIAGNOSIS — Z87891 Personal history of nicotine dependence: Secondary | ICD-10-CM | POA: Diagnosis not present

## 2020-05-05 DIAGNOSIS — D649 Anemia, unspecified: Secondary | ICD-10-CM | POA: Diagnosis not present

## 2020-05-05 DIAGNOSIS — Z6841 Body Mass Index (BMI) 40.0 and over, adult: Secondary | ICD-10-CM | POA: Diagnosis not present

## 2020-05-05 DIAGNOSIS — I10 Essential (primary) hypertension: Secondary | ICD-10-CM | POA: Diagnosis not present

## 2020-05-05 DIAGNOSIS — K219 Gastro-esophageal reflux disease without esophagitis: Secondary | ICD-10-CM | POA: Diagnosis not present

## 2020-05-05 NOTE — Telephone Encounter (Signed)
Spoke with home health physical therapist, Jae Dire.  She reports that patient has been able to sit on the edge of the bed with physical therapy assistant.  She has some concerned about difficulties with his right leg and inability to move it very much.  The plan is for physical therapy as well as self exercise over the next 4 weeks to see if he gains any strength.  We discussed home equipment including wheelchair, not sure if he will fit in it or if it will fit through the door.  Also awaiting sliding board but this will require at least 2 people to use.  The same is true for a whole year or standing lift.  Discussed also moving his bed to another room as of right now, he cannot be transported in a stretcher through the door.

## 2020-05-06 DIAGNOSIS — K219 Gastro-esophageal reflux disease without esophagitis: Secondary | ICD-10-CM | POA: Diagnosis not present

## 2020-05-06 DIAGNOSIS — M150 Primary generalized (osteo)arthritis: Secondary | ICD-10-CM | POA: Diagnosis not present

## 2020-05-06 DIAGNOSIS — Z6841 Body Mass Index (BMI) 40.0 and over, adult: Secondary | ICD-10-CM | POA: Diagnosis not present

## 2020-05-06 DIAGNOSIS — I10 Essential (primary) hypertension: Secondary | ICD-10-CM | POA: Diagnosis not present

## 2020-05-06 DIAGNOSIS — G4733 Obstructive sleep apnea (adult) (pediatric): Secondary | ICD-10-CM | POA: Diagnosis not present

## 2020-05-06 DIAGNOSIS — Z9181 History of falling: Secondary | ICD-10-CM | POA: Diagnosis not present

## 2020-05-06 DIAGNOSIS — Z87891 Personal history of nicotine dependence: Secondary | ICD-10-CM | POA: Diagnosis not present

## 2020-05-06 DIAGNOSIS — D649 Anemia, unspecified: Secondary | ICD-10-CM | POA: Diagnosis not present

## 2020-05-10 DIAGNOSIS — M150 Primary generalized (osteo)arthritis: Secondary | ICD-10-CM | POA: Diagnosis not present

## 2020-05-10 DIAGNOSIS — Z6841 Body Mass Index (BMI) 40.0 and over, adult: Secondary | ICD-10-CM | POA: Diagnosis not present

## 2020-05-10 DIAGNOSIS — I10 Essential (primary) hypertension: Secondary | ICD-10-CM | POA: Diagnosis not present

## 2020-05-10 DIAGNOSIS — D649 Anemia, unspecified: Secondary | ICD-10-CM | POA: Diagnosis not present

## 2020-05-10 DIAGNOSIS — Z9181 History of falling: Secondary | ICD-10-CM | POA: Diagnosis not present

## 2020-05-10 DIAGNOSIS — K219 Gastro-esophageal reflux disease without esophagitis: Secondary | ICD-10-CM | POA: Diagnosis not present

## 2020-05-10 DIAGNOSIS — G4733 Obstructive sleep apnea (adult) (pediatric): Secondary | ICD-10-CM | POA: Diagnosis not present

## 2020-05-10 DIAGNOSIS — Z87891 Personal history of nicotine dependence: Secondary | ICD-10-CM | POA: Diagnosis not present

## 2020-05-11 DIAGNOSIS — M6281 Muscle weakness (generalized): Secondary | ICD-10-CM | POA: Diagnosis not present

## 2020-05-13 DIAGNOSIS — Z87891 Personal history of nicotine dependence: Secondary | ICD-10-CM | POA: Diagnosis not present

## 2020-05-13 DIAGNOSIS — Z9181 History of falling: Secondary | ICD-10-CM | POA: Diagnosis not present

## 2020-05-13 DIAGNOSIS — M150 Primary generalized (osteo)arthritis: Secondary | ICD-10-CM | POA: Diagnosis not present

## 2020-05-13 DIAGNOSIS — D649 Anemia, unspecified: Secondary | ICD-10-CM | POA: Diagnosis not present

## 2020-05-13 DIAGNOSIS — G4733 Obstructive sleep apnea (adult) (pediatric): Secondary | ICD-10-CM | POA: Diagnosis not present

## 2020-05-13 DIAGNOSIS — K219 Gastro-esophageal reflux disease without esophagitis: Secondary | ICD-10-CM | POA: Diagnosis not present

## 2020-05-13 DIAGNOSIS — I10 Essential (primary) hypertension: Secondary | ICD-10-CM | POA: Diagnosis not present

## 2020-05-13 DIAGNOSIS — Z6841 Body Mass Index (BMI) 40.0 and over, adult: Secondary | ICD-10-CM | POA: Diagnosis not present

## 2020-05-14 ENCOUNTER — Telehealth: Payer: Self-pay

## 2020-05-14 DIAGNOSIS — Z9181 History of falling: Secondary | ICD-10-CM | POA: Diagnosis not present

## 2020-05-14 DIAGNOSIS — Z6841 Body Mass Index (BMI) 40.0 and over, adult: Secondary | ICD-10-CM | POA: Diagnosis not present

## 2020-05-14 DIAGNOSIS — Z87891 Personal history of nicotine dependence: Secondary | ICD-10-CM | POA: Diagnosis not present

## 2020-05-14 DIAGNOSIS — D649 Anemia, unspecified: Secondary | ICD-10-CM | POA: Diagnosis not present

## 2020-05-14 DIAGNOSIS — M150 Primary generalized (osteo)arthritis: Secondary | ICD-10-CM | POA: Diagnosis not present

## 2020-05-14 DIAGNOSIS — K219 Gastro-esophageal reflux disease without esophagitis: Secondary | ICD-10-CM | POA: Diagnosis not present

## 2020-05-14 DIAGNOSIS — I10 Essential (primary) hypertension: Secondary | ICD-10-CM | POA: Diagnosis not present

## 2020-05-14 DIAGNOSIS — G4733 Obstructive sleep apnea (adult) (pediatric): Secondary | ICD-10-CM | POA: Diagnosis not present

## 2020-05-14 NOTE — Telephone Encounter (Signed)
Harriett Sine, an OT with [facility]... she wanted to let you know that before starting pt's visit, his BP was checked and BP was 161/110, current rule is to notify you if pt has BP above 14/90... Harriett Sine states pt's BP has been 140s/80s.... This is as an Financial planner as pt has an upcoming appt but also to make sure that you do not want to make changes to the criteria they need to follow in reference to when they should call you with update on BP

## 2020-05-15 ENCOUNTER — Encounter: Payer: Self-pay | Admitting: Family Medicine

## 2020-05-15 ENCOUNTER — Telehealth (INDEPENDENT_AMBULATORY_CARE_PROVIDER_SITE_OTHER): Payer: Medicare HMO | Admitting: Family Medicine

## 2020-05-15 ENCOUNTER — Other Ambulatory Visit: Payer: Self-pay

## 2020-05-15 VITALS — Ht 76.0 in

## 2020-05-15 DIAGNOSIS — Z7401 Bed confinement status: Secondary | ICD-10-CM

## 2020-05-15 DIAGNOSIS — M1611 Unilateral primary osteoarthritis, right hip: Secondary | ICD-10-CM

## 2020-05-15 DIAGNOSIS — R030 Elevated blood-pressure reading, without diagnosis of hypertension: Secondary | ICD-10-CM | POA: Diagnosis not present

## 2020-05-15 NOTE — Progress Notes (Signed)
Virtual Visit via Video Note  I connected with Michael Payne on 05/15/20 at 11:00 AM EDT by a video enabled telemedicine application and verified that I am speaking with the correct person using two identifiers.  Location: Patient: In his home Provider: [facility] Persons participating in virtual visit: Patient, provider, patient's wife Michael Payne   I discussed the limitations of evaluation and management by telemedicine and the availability of in person appointments. The patient expressed understanding and agreed to proceed.  History of Present Illness: Chief Complaint  Patient presents with  . Follow-up   This is a 49 year old male who presents today for virtual visit for follow-up of right hip pain, bilateral knee pain, morbid obesity.  Pain-he has constant right hip and bilateral knee pain.  He was previously managed by pain management on oxycodone acetaminophen 10-325 mg every 6 hours regimen.  His pain management provider is no longer providing pain management.  I have provided recent prescription for patient to continue current medication.  He reports short-term relief with current regimen.  He is also having increased spasms of his right hip and leg.  He has a prescription for tizanidine which he takes with little relief and increased lethargy.  He tried the lidocaine 5% patch but this caused increased spasms.  He has been on gabapentin in the past but it caused stomach upset.  He is getting weekly visits from PT and OT.  He reports that with assistance of 2 persons he is able to sit on the side of the bed.  He is unable to do this on his own yet.  He has a bariatric bed the head of which she does not go to a full 90 degrees.  I spoke with his occupational therapist who reports that they are waiting on a sliding board which has to be made due to his size.  She does not think that the head of the bed elevates any further.  She does not think that a trapeze would be helpful.  They  have discussed using a bad letter.  Occupational therapist reports that he continues to have little strength in his right leg.  He has significant pain even with sitting on side of bed.  I had previously signed orders for patient to have home health assistance several times a week to help with bathing and personal care as the patient's wife works outside of the home.  According to the patient, this assistance has not materialized.  I have left a message for the social worker to check on this.   Observations/Objective: Patient is alert and answers questions appropriately.  Visible skin is unremarkable.  Respirations are even and unlabored. Ht 6\' 4"  (1.93 m)   BMI 60.38 kg/m    Assessment and Plan: 1. Unilateral osteoarthritis of hip, right -Discussed pain management with patient.  Ideally, he would not need to take oxycodone- acetaminophen 4 times a day due to potential side effects.  Not getting much relief from spasm with tizanidine.  I am going to reach out to physical medicine and rehabilitation and see if anyone in their office may be able to work with him virtually. -Needs a hip replacement but this cannot occur until he has lost significant weight.  2. Morbid obesity (HCC) -This is significantly limiting his mobility and he and his wife report that he is being very careful about what is he is eating.  We have discussed in the past avoiding sugars and carbs.  Eating adequate protein  for healing.  3. Bedbound -Left a message for social worker regarding aide to help with bathing and personal care.  4. Elevated blood pressure reading -Reviewed notification parameters with occupational therapist.  She reports that elevated reading was during a time patient was having significant pain   Michael Ree, FNP-BC  [facility], [facility]  05/18/2020 6:23 PM   Follow Up Instructions:    I discussed the assessment and treatment plan with the  patient. The patient was provided an opportunity to ask questions and all were answered. The patient agreed with the plan and demonstrated an understanding of the instructions.   The patient was advised to call back or seek an in-person evaluation if the symptoms worsen or if the condition fails to improve as anticipated.   Michael Belfast, FNP

## 2020-05-15 NOTE — Telephone Encounter (Signed)
Attempted to call OT. No answer, will try again later today.

## 2020-05-15 NOTE — Telephone Encounter (Signed)
Left message for OT with my cell number.

## 2020-05-15 NOTE — Telephone Encounter (Signed)
Spoke with Michael Payne, OT with [facility].  Patient has been able to sit on side of the bed with minimal assistance.  She is not sure why his head of bed does not elevate more, she thinks that this is related to it being a bariatric bed. I have sent a community message to [employer]. Michael Payne did not think trapeze would be beneficial. Patient would benefit from more intensive therapy but can only have PT/ OT q week due to insurance. New parameters for reporting blood pressure given, >150/100.

## 2020-05-18 ENCOUNTER — Encounter: Payer: Self-pay | Admitting: Family Medicine

## 2020-05-19 DIAGNOSIS — Z87891 Personal history of nicotine dependence: Secondary | ICD-10-CM | POA: Diagnosis not present

## 2020-05-19 DIAGNOSIS — Z6841 Body Mass Index (BMI) 40.0 and over, adult: Secondary | ICD-10-CM | POA: Diagnosis not present

## 2020-05-19 DIAGNOSIS — Z9181 History of falling: Secondary | ICD-10-CM | POA: Diagnosis not present

## 2020-05-19 DIAGNOSIS — D649 Anemia, unspecified: Secondary | ICD-10-CM | POA: Diagnosis not present

## 2020-05-19 DIAGNOSIS — M150 Primary generalized (osteo)arthritis: Secondary | ICD-10-CM | POA: Diagnosis not present

## 2020-05-19 DIAGNOSIS — G4733 Obstructive sleep apnea (adult) (pediatric): Secondary | ICD-10-CM | POA: Diagnosis not present

## 2020-05-19 DIAGNOSIS — K219 Gastro-esophageal reflux disease without esophagitis: Secondary | ICD-10-CM | POA: Diagnosis not present

## 2020-05-19 DIAGNOSIS — I10 Essential (primary) hypertension: Secondary | ICD-10-CM | POA: Diagnosis not present

## 2020-05-20 ENCOUNTER — Other Ambulatory Visit: Payer: Self-pay | Admitting: Family Medicine

## 2020-05-20 ENCOUNTER — Telehealth: Payer: Self-pay | Admitting: Family Medicine

## 2020-05-20 DIAGNOSIS — G8929 Other chronic pain: Secondary | ICD-10-CM

## 2020-05-20 DIAGNOSIS — D649 Anemia, unspecified: Secondary | ICD-10-CM | POA: Diagnosis not present

## 2020-05-20 DIAGNOSIS — M1611 Unilateral primary osteoarthritis, right hip: Secondary | ICD-10-CM

## 2020-05-20 DIAGNOSIS — Z9181 History of falling: Secondary | ICD-10-CM | POA: Diagnosis not present

## 2020-05-20 DIAGNOSIS — M25562 Pain in left knee: Secondary | ICD-10-CM

## 2020-05-20 DIAGNOSIS — Z7401 Bed confinement status: Secondary | ICD-10-CM

## 2020-05-20 DIAGNOSIS — K219 Gastro-esophageal reflux disease without esophagitis: Secondary | ICD-10-CM | POA: Diagnosis not present

## 2020-05-20 DIAGNOSIS — M150 Primary generalized (osteo)arthritis: Secondary | ICD-10-CM | POA: Diagnosis not present

## 2020-05-20 DIAGNOSIS — Z6841 Body Mass Index (BMI) 40.0 and over, adult: Secondary | ICD-10-CM | POA: Diagnosis not present

## 2020-05-20 DIAGNOSIS — G4733 Obstructive sleep apnea (adult) (pediatric): Secondary | ICD-10-CM | POA: Diagnosis not present

## 2020-05-20 DIAGNOSIS — I10 Essential (primary) hypertension: Secondary | ICD-10-CM | POA: Diagnosis not present

## 2020-05-20 DIAGNOSIS — Z87891 Personal history of nicotine dependence: Secondary | ICD-10-CM | POA: Diagnosis not present

## 2020-05-20 NOTE — Telephone Encounter (Signed)
Please call patient and let him know that I have been in touch with one of the physical medicine and rehabilitation physicians affiliated with [facility].  I have put in a referral and they will contact him to do a virtual visit.

## 2020-05-20 NOTE — Progress Notes (Signed)
I contacted Harold Hedge, MD, physical medicine and rehabilitation regarding this patient.  He said that someone in his office could do a virtual visit with the patient.  Order placed in telephone encounter created to notify patient.

## 2020-05-22 ENCOUNTER — Encounter: Payer: Self-pay | Admitting: Family Medicine

## 2020-05-22 ENCOUNTER — Telehealth: Payer: Self-pay

## 2020-05-22 DIAGNOSIS — Z7401 Bed confinement status: Secondary | ICD-10-CM | POA: Insufficient documentation

## 2020-05-22 NOTE — Telephone Encounter (Signed)
Called and spoke with Michael Payne, informed him of message from Deboraha Sprang NP.  Pt verbalized understanding..  Pt wants to know if we received the paper work from his Child psychotherapist.  I didn't see paper work in [employer] and front office is closed for the day.  I advised pt that I will contact him on Tuesday,  I will check back with front office.

## 2020-05-22 NOTE — Telephone Encounter (Signed)
Form received. Placed on Robin's desk. Please stamp, copy and fax. Place copy in my box and send copy to scan.

## 2020-05-22 NOTE — Telephone Encounter (Signed)
Redmond Baseman social worker with [facility] left v/m that they are trying to get pt home health aide services thru medicaid with [facility] who is in charge of this program and [facility] has not received referral form yet. Gar Gibbon request to refax the referral form to [facility] (Fax # is on form) see 04/22/20 phone note. FYI to Harlin Heys FNP and Asht[facility]  [facility].

## 2020-05-22 NOTE — Telephone Encounter (Signed)
Unable to locate form.  Not in scanned media documents.  I remember filling it out.  I have asked Annmarie to resend.  She is going to send it to me today and I will complete as soon as I can.

## 2020-05-26 DIAGNOSIS — Z87891 Personal history of nicotine dependence: Secondary | ICD-10-CM | POA: Diagnosis not present

## 2020-05-26 DIAGNOSIS — I10 Essential (primary) hypertension: Secondary | ICD-10-CM | POA: Diagnosis not present

## 2020-05-26 DIAGNOSIS — D649 Anemia, unspecified: Secondary | ICD-10-CM | POA: Diagnosis not present

## 2020-05-26 DIAGNOSIS — Z6841 Body Mass Index (BMI) 40.0 and over, adult: Secondary | ICD-10-CM | POA: Diagnosis not present

## 2020-05-26 DIAGNOSIS — G4733 Obstructive sleep apnea (adult) (pediatric): Secondary | ICD-10-CM | POA: Diagnosis not present

## 2020-05-26 DIAGNOSIS — K219 Gastro-esophageal reflux disease without esophagitis: Secondary | ICD-10-CM | POA: Diagnosis not present

## 2020-05-26 DIAGNOSIS — M150 Primary generalized (osteo)arthritis: Secondary | ICD-10-CM | POA: Diagnosis not present

## 2020-05-26 DIAGNOSIS — Z9181 History of falling: Secondary | ICD-10-CM | POA: Diagnosis not present

## 2020-05-26 NOTE — Telephone Encounter (Signed)
Paperwork faxed Copy in debbies in box

## 2020-05-27 DIAGNOSIS — D649 Anemia, unspecified: Secondary | ICD-10-CM | POA: Diagnosis not present

## 2020-05-27 DIAGNOSIS — Z6841 Body Mass Index (BMI) 40.0 and over, adult: Secondary | ICD-10-CM | POA: Diagnosis not present

## 2020-05-27 DIAGNOSIS — G4733 Obstructive sleep apnea (adult) (pediatric): Secondary | ICD-10-CM | POA: Diagnosis not present

## 2020-05-27 DIAGNOSIS — I10 Essential (primary) hypertension: Secondary | ICD-10-CM | POA: Diagnosis not present

## 2020-05-27 DIAGNOSIS — K219 Gastro-esophageal reflux disease without esophagitis: Secondary | ICD-10-CM | POA: Diagnosis not present

## 2020-05-27 DIAGNOSIS — M150 Primary generalized (osteo)arthritis: Secondary | ICD-10-CM | POA: Diagnosis not present

## 2020-05-27 DIAGNOSIS — Z87891 Personal history of nicotine dependence: Secondary | ICD-10-CM | POA: Diagnosis not present

## 2020-05-27 DIAGNOSIS — Z9181 History of falling: Secondary | ICD-10-CM | POA: Diagnosis not present

## 2020-05-30 ENCOUNTER — Other Ambulatory Visit: Payer: Self-pay | Admitting: Family Medicine

## 2020-06-01 NOTE — Telephone Encounter (Signed)
Last office visit 05/15/2020 for follow up.  Last refilled 04/29/2020 for #60 with 1 refill.  No future appointments with PCP.

## 2020-06-02 ENCOUNTER — Encounter: Payer: Self-pay | Admitting: Physical Medicine & Rehabilitation

## 2020-06-04 DIAGNOSIS — Z6841 Body Mass Index (BMI) 40.0 and over, adult: Secondary | ICD-10-CM | POA: Diagnosis not present

## 2020-06-04 DIAGNOSIS — M150 Primary generalized (osteo)arthritis: Secondary | ICD-10-CM | POA: Diagnosis not present

## 2020-06-04 DIAGNOSIS — Z9181 History of falling: Secondary | ICD-10-CM | POA: Diagnosis not present

## 2020-06-04 DIAGNOSIS — I10 Essential (primary) hypertension: Secondary | ICD-10-CM | POA: Diagnosis not present

## 2020-06-04 DIAGNOSIS — G4733 Obstructive sleep apnea (adult) (pediatric): Secondary | ICD-10-CM | POA: Diagnosis not present

## 2020-06-04 DIAGNOSIS — K219 Gastro-esophageal reflux disease without esophagitis: Secondary | ICD-10-CM | POA: Diagnosis not present

## 2020-06-04 DIAGNOSIS — Z87891 Personal history of nicotine dependence: Secondary | ICD-10-CM | POA: Diagnosis not present

## 2020-06-04 DIAGNOSIS — D649 Anemia, unspecified: Secondary | ICD-10-CM | POA: Diagnosis not present

## 2020-06-05 ENCOUNTER — Telehealth: Payer: Self-pay

## 2020-06-05 ENCOUNTER — Other Ambulatory Visit: Payer: Self-pay | Admitting: Family Medicine

## 2020-06-05 DIAGNOSIS — M150 Primary generalized (osteo)arthritis: Secondary | ICD-10-CM | POA: Diagnosis not present

## 2020-06-05 DIAGNOSIS — Z7401 Bed confinement status: Secondary | ICD-10-CM

## 2020-06-05 DIAGNOSIS — D649 Anemia, unspecified: Secondary | ICD-10-CM | POA: Diagnosis not present

## 2020-06-05 DIAGNOSIS — I10 Essential (primary) hypertension: Secondary | ICD-10-CM | POA: Diagnosis not present

## 2020-06-05 DIAGNOSIS — Z9181 History of falling: Secondary | ICD-10-CM | POA: Diagnosis not present

## 2020-06-05 DIAGNOSIS — G4733 Obstructive sleep apnea (adult) (pediatric): Secondary | ICD-10-CM | POA: Diagnosis not present

## 2020-06-05 DIAGNOSIS — Z87891 Personal history of nicotine dependence: Secondary | ICD-10-CM | POA: Diagnosis not present

## 2020-06-05 DIAGNOSIS — G8929 Other chronic pain: Secondary | ICD-10-CM

## 2020-06-05 DIAGNOSIS — K219 Gastro-esophageal reflux disease without esophagitis: Secondary | ICD-10-CM | POA: Diagnosis not present

## 2020-06-05 DIAGNOSIS — M1611 Unilateral primary osteoarthritis, right hip: Secondary | ICD-10-CM

## 2020-06-05 DIAGNOSIS — Z6841 Body Mass Index (BMI) 40.0 and over, adult: Secondary | ICD-10-CM | POA: Diagnosis not present

## 2020-06-05 NOTE — Telephone Encounter (Signed)
Please call and give verbal orders for OT. I have placed order for trapeze bar and sent message to [employer].

## 2020-06-05 NOTE — Telephone Encounter (Signed)
Michael Payne PT with [facility] needs verbal PT orders  1 x week for 9 weeks. Call orders to [phone].

## 2020-06-05 NOTE — Telephone Encounter (Signed)
Please call with ok for verbal orders for PT weekly x 9 weeks.

## 2020-06-05 NOTE — Telephone Encounter (Signed)
Left message on vmail for Harriett Sine giving verbal orders per Deboraha Sprang, FNP

## 2020-06-05 NOTE — Telephone Encounter (Signed)
Requesting verbal order OT 1 x week for 9 weeks for upper body strengthening to work toward out of bed activities. Call orders to [phone]  Talked about getting trapeze bar attachment. Would like to try it. Please send rx to [employer]. She said we should have the fax for that.

## 2020-06-08 NOTE — Telephone Encounter (Signed)
Called pt with verbal orders per Deboraha Sprang, FNP

## 2020-06-09 DIAGNOSIS — M1611 Unilateral primary osteoarthritis, right hip: Secondary | ICD-10-CM | POA: Diagnosis not present

## 2020-06-09 DIAGNOSIS — K219 Gastro-esophageal reflux disease without esophagitis: Secondary | ICD-10-CM | POA: Diagnosis not present

## 2020-06-09 DIAGNOSIS — Z6841 Body Mass Index (BMI) 40.0 and over, adult: Secondary | ICD-10-CM | POA: Diagnosis not present

## 2020-06-09 DIAGNOSIS — G4733 Obstructive sleep apnea (adult) (pediatric): Secondary | ICD-10-CM | POA: Diagnosis not present

## 2020-06-09 DIAGNOSIS — Z87891 Personal history of nicotine dependence: Secondary | ICD-10-CM | POA: Diagnosis not present

## 2020-06-09 DIAGNOSIS — D649 Anemia, unspecified: Secondary | ICD-10-CM | POA: Diagnosis not present

## 2020-06-09 DIAGNOSIS — Z9181 History of falling: Secondary | ICD-10-CM | POA: Diagnosis not present

## 2020-06-09 DIAGNOSIS — I10 Essential (primary) hypertension: Secondary | ICD-10-CM | POA: Diagnosis not present

## 2020-06-09 DIAGNOSIS — M150 Primary generalized (osteo)arthritis: Secondary | ICD-10-CM | POA: Diagnosis not present

## 2020-06-10 DIAGNOSIS — I10 Essential (primary) hypertension: Secondary | ICD-10-CM | POA: Diagnosis not present

## 2020-06-10 DIAGNOSIS — G4733 Obstructive sleep apnea (adult) (pediatric): Secondary | ICD-10-CM | POA: Diagnosis not present

## 2020-06-10 DIAGNOSIS — Z87891 Personal history of nicotine dependence: Secondary | ICD-10-CM | POA: Diagnosis not present

## 2020-06-10 DIAGNOSIS — D649 Anemia, unspecified: Secondary | ICD-10-CM | POA: Diagnosis not present

## 2020-06-10 DIAGNOSIS — K219 Gastro-esophageal reflux disease without esophagitis: Secondary | ICD-10-CM | POA: Diagnosis not present

## 2020-06-10 DIAGNOSIS — M150 Primary generalized (osteo)arthritis: Secondary | ICD-10-CM | POA: Diagnosis not present

## 2020-06-10 DIAGNOSIS — Z6841 Body Mass Index (BMI) 40.0 and over, adult: Secondary | ICD-10-CM | POA: Diagnosis not present

## 2020-06-10 DIAGNOSIS — Z9181 History of falling: Secondary | ICD-10-CM | POA: Diagnosis not present

## 2020-06-11 DIAGNOSIS — M6281 Muscle weakness (generalized): Secondary | ICD-10-CM | POA: Diagnosis not present

## 2020-06-17 ENCOUNTER — Telehealth: Payer: Self-pay | Admitting: Family Medicine

## 2020-06-17 DIAGNOSIS — Z9181 History of falling: Secondary | ICD-10-CM | POA: Diagnosis not present

## 2020-06-17 DIAGNOSIS — Z87891 Personal history of nicotine dependence: Secondary | ICD-10-CM | POA: Diagnosis not present

## 2020-06-17 DIAGNOSIS — K219 Gastro-esophageal reflux disease without esophagitis: Secondary | ICD-10-CM | POA: Diagnosis not present

## 2020-06-17 DIAGNOSIS — G4733 Obstructive sleep apnea (adult) (pediatric): Secondary | ICD-10-CM | POA: Diagnosis not present

## 2020-06-17 DIAGNOSIS — I10 Essential (primary) hypertension: Secondary | ICD-10-CM | POA: Diagnosis not present

## 2020-06-17 DIAGNOSIS — D649 Anemia, unspecified: Secondary | ICD-10-CM | POA: Diagnosis not present

## 2020-06-17 DIAGNOSIS — Z6841 Body Mass Index (BMI) 40.0 and over, adult: Secondary | ICD-10-CM | POA: Diagnosis not present

## 2020-06-17 DIAGNOSIS — M150 Primary generalized (osteo)arthritis: Secondary | ICD-10-CM | POA: Diagnosis not present

## 2020-06-17 NOTE — Telephone Encounter (Signed)
Called and spoke to patient regarding referral. He states that surgeon was suggested to him by PT because he will operate on bariatric patients. I plan to discuss with PT. Want to ensure as much as possible that orthopedic surgeon is good fit for patient prior to referral. Want to see where he operates since patient is in [place]. I will try to talk with PT tomorrow.

## 2020-06-17 NOTE — Telephone Encounter (Signed)
Pt called wanting to get a referral to dr Domenic Schwab  Regarding hip pain pt stated md Rosita Kea is a IT trainer. [phone] phone  Pt stated debbie is aware of problem

## 2020-06-19 NOTE — Telephone Encounter (Signed)
Called [facility] and left message for PT to return my call.

## 2020-06-21 ENCOUNTER — Other Ambulatory Visit: Payer: Self-pay | Admitting: Family Medicine

## 2020-06-21 DIAGNOSIS — G8929 Other chronic pain: Secondary | ICD-10-CM

## 2020-06-22 ENCOUNTER — Other Ambulatory Visit: Payer: Self-pay | Admitting: Family Medicine

## 2020-06-22 DIAGNOSIS — M1611 Unilateral primary osteoarthritis, right hip: Secondary | ICD-10-CM

## 2020-06-22 NOTE — Telephone Encounter (Signed)
Last office visit 05/15/2020.  Last  Refill 05/10/2020. Next upcoming appt 04/09/2021

## 2020-06-23 NOTE — Telephone Encounter (Signed)
Spoke with [facility] PT, Thomas Hoff on 06/22/2020. Discussed patient's progress, upcoming appointment with physical medicine and rehabilitation. Patient interested in consult with orthopedic surgeon, Dr. Rosita Kea. Referral placed.

## 2020-06-24 DIAGNOSIS — Z6841 Body Mass Index (BMI) 40.0 and over, adult: Secondary | ICD-10-CM | POA: Diagnosis not present

## 2020-06-24 DIAGNOSIS — K219 Gastro-esophageal reflux disease without esophagitis: Secondary | ICD-10-CM | POA: Diagnosis not present

## 2020-06-24 DIAGNOSIS — I10 Essential (primary) hypertension: Secondary | ICD-10-CM | POA: Diagnosis not present

## 2020-06-24 DIAGNOSIS — D649 Anemia, unspecified: Secondary | ICD-10-CM | POA: Diagnosis not present

## 2020-06-24 DIAGNOSIS — M150 Primary generalized (osteo)arthritis: Secondary | ICD-10-CM | POA: Diagnosis not present

## 2020-06-24 DIAGNOSIS — Z87891 Personal history of nicotine dependence: Secondary | ICD-10-CM | POA: Diagnosis not present

## 2020-06-24 DIAGNOSIS — G4733 Obstructive sleep apnea (adult) (pediatric): Secondary | ICD-10-CM | POA: Diagnosis not present

## 2020-06-24 DIAGNOSIS — Z9181 History of falling: Secondary | ICD-10-CM | POA: Diagnosis not present

## 2020-06-24 NOTE — Telephone Encounter (Signed)
Pt said he still haven't heard anything regarding home health aide. He would like to know what he needs to do?

## 2020-06-24 NOTE — Telephone Encounter (Signed)
Pt is calling back and said social worker Ammi said please send fax [phone] or [phone]. He would like a call when this is sent.

## 2020-06-24 NOTE — Telephone Encounter (Addendum)
PCS forms refaxed to [phone] as requested.  Mr. Tellefsen notified by telephone.

## 2020-06-25 ENCOUNTER — Other Ambulatory Visit: Payer: Self-pay

## 2020-06-25 ENCOUNTER — Other Ambulatory Visit: Payer: Self-pay | Admitting: Family Medicine

## 2020-06-25 DIAGNOSIS — M25551 Pain in right hip: Secondary | ICD-10-CM

## 2020-06-25 MED ORDER — OXYCODONE-ACETAMINOPHEN 10-325 MG PO TABS: 1.0000 | ORAL_TABLET | Freq: Four times a day (QID) | ORAL | 0 refills | Status: DC | PRN

## 2020-06-25 NOTE — Telephone Encounter (Signed)
Pt called for refill on Oxycodone   LAST APPOINTMENT DATE: 05/15/2020  NEXT APPOINTMENT DATE: Visit date not found    LAST REFILL: 05/09/2020 QTY:#120 no rf

## 2020-06-27 ENCOUNTER — Encounter: Payer: Self-pay | Admitting: Family Medicine

## 2020-06-27 ENCOUNTER — Telehealth: Payer: Self-pay

## 2020-06-27 ENCOUNTER — Telehealth (INDEPENDENT_AMBULATORY_CARE_PROVIDER_SITE_OTHER): Payer: Medicare HMO | Admitting: Family Medicine

## 2020-06-27 VITALS — BP 144/83

## 2020-06-27 DIAGNOSIS — Z7401 Bed confinement status: Secondary | ICD-10-CM

## 2020-06-27 DIAGNOSIS — M1611 Unilateral primary osteoarthritis, right hip: Secondary | ICD-10-CM

## 2020-06-27 DIAGNOSIS — K149 Disease of tongue, unspecified: Secondary | ICD-10-CM

## 2020-06-27 DIAGNOSIS — E559 Vitamin D deficiency, unspecified: Secondary | ICD-10-CM | POA: Diagnosis not present

## 2020-06-27 MED ORDER — DICLOFENAC SODIUM 75 MG PO TBEC: 75.0000 mg | DELAYED_RELEASE_TABLET | Freq: Two times a day (BID) | ORAL | 2 refills | Status: DC | PRN

## 2020-06-27 NOTE — Progress Notes (Signed)
Virtual Visit via Video Note  I connected with Michael Payne on 06/27/20 at  9:00 AM EDT by a video enabled telemedicine application and verified that I am speaking with the correct person using two identifiers.  Location: Patient: In his home Provider: [facility] Persons participating in virtual visit: Patient, provider   I discussed the limitations of evaluation and management by telemedicine and the availability of in person appointments. The patient expressed understanding and agreed to proceed.  History of Present Illness: Chief Complaint  Patient presents with  . Medication Refill    pharmacy denied diclofenac. Also concern about discoloration on tongue.    This is a 49 year old male who presents today for acute visit with a couple of questions.  He is currently bedbound with severe right hip osteoarthritis and morbid obesity.  He is getting home health OT, PT and social work services.  He has an appointment with orthopedics in about 3 weeks and is working to obtain transportation to his appointment.  Social worker is helping him to coordinate this.  He does not feel like he is making very significant progress with his mobility.  He reports increased upper extremity strength but is disappointed in his mobility.  Severe right hip pain which is currently managed with oxycodone-acetaminophen 10-3 25 every 6 hours as needed, diclofenac 75 mg twice daily as needed.  He reports that pharmacy has denied diclofenac, needing prior authorization.  Earlier this week he had a bright red tongue with some sensitivity to sauces.  No known trigger.  The redness has improved and sensitivity has improved.   Vitamin D deficiency-he has a history of low vitamin D and is currently prescribed high-dose weekly supplement.  He reports that he has not been taking it as it makes him urinate more.  Observations/Objective: Patient is supine in bed.  He is alert and answers questions appropriately.   Visible skin is unremarkable.  I do not see any gross abnormalities of tongue on the video screen.  Respirations are even and unlabored without increased work of breathing with conversation.  No audible wheeze or witnessed cough.  Mood and affect are appropriate.  Assessment and Plan: 1. Unilateral osteoarthritis of hip, right -He has upcoming appointment with orthopedic surgeon.  He also has an appointment with physical medicine and rehabilitation next month.  I have encouraged him to call their office to get on a cancellation list to see if he could get a sooner appointment.  He agrees to do this on Monday. - diclofenac (VOLTAREN) 75 MG EC tablet; Take 1 tablet (75 mg total) by mouth 2 (two) times daily as needed.  Dispense: 60 tablet; Refill: 2  2. Bedbound -Continue work with home OT, PT, social work  3. Red tongue -Unclear etiology, seems to be improving.  Follow-up if worsening symptoms  4. Vitamin D deficiency -Discussed importance of supplementation given his bone health and severe osteoarthritis   Olean Ree, FNP-BC  [facility], [facility]  06/27/2020 9:49 AM   Follow Up Instructions:    I discussed the assessment and treatment plan with the patient. The patient was provided an opportunity to ask questions and all were answered. The patient agreed with the plan and demonstrated an understanding of the instructions.   The patient was advised to call back or seek an in-person evaluation if the symptoms worsen or if the condition fails to improve as anticipated.   Emi Belfast, FNP

## 2020-06-27 NOTE — Telephone Encounter (Signed)
[facility]ed [facility] about pt's medication diclofenac which he said was denied by pharmacy. Spoke with pharmacy rep Jonn Shingles and she stated that the medication was covered by medicare part D and it will be ready in 30 minutes.  Called pt and left message on vmail.

## 2020-07-01 ENCOUNTER — Telehealth: Payer: Self-pay

## 2020-07-01 DIAGNOSIS — I10 Essential (primary) hypertension: Secondary | ICD-10-CM | POA: Diagnosis not present

## 2020-07-01 DIAGNOSIS — Z6841 Body Mass Index (BMI) 40.0 and over, adult: Secondary | ICD-10-CM | POA: Diagnosis not present

## 2020-07-01 DIAGNOSIS — Z9181 History of falling: Secondary | ICD-10-CM | POA: Diagnosis not present

## 2020-07-01 DIAGNOSIS — K219 Gastro-esophageal reflux disease without esophagitis: Secondary | ICD-10-CM | POA: Diagnosis not present

## 2020-07-01 DIAGNOSIS — D649 Anemia, unspecified: Secondary | ICD-10-CM | POA: Diagnosis not present

## 2020-07-01 DIAGNOSIS — Z87891 Personal history of nicotine dependence: Secondary | ICD-10-CM | POA: Diagnosis not present

## 2020-07-01 DIAGNOSIS — G4733 Obstructive sleep apnea (adult) (pediatric): Secondary | ICD-10-CM | POA: Diagnosis not present

## 2020-07-01 DIAGNOSIS — M150 Primary generalized (osteo)arthritis: Secondary | ICD-10-CM | POA: Diagnosis not present

## 2020-07-01 NOTE — Telephone Encounter (Signed)
Michael Payne OT with [facility] left v/m requesting a prescription for a bariatric w/c cushion be faxed to [facility](no fax # left). The trapeze that was previously ordered is helping but the slide board previously ordered and received is not working yet due to the w/c is lower than the bed and that is the need for the bariatric w/c cushion.

## 2020-07-02 ENCOUNTER — Telehealth: Payer: Self-pay

## 2020-07-02 NOTE — Telephone Encounter (Signed)
Received vm from [place], [place] with [facility], requesting a call back at [phone] from [place].  States she wants to update Debbie concerning the home visit yesterday.  Attempted to call Harriett Sine back but got vm.

## 2020-07-02 NOTE — Telephone Encounter (Signed)
Left message for OT, Harriett Sine to let her know it has been ordered.

## 2020-07-02 NOTE — Telephone Encounter (Signed)
Request sent through [employer].

## 2020-07-03 ENCOUNTER — Other Ambulatory Visit: Payer: Self-pay | Admitting: Family Medicine

## 2020-07-03 DIAGNOSIS — M1611 Unilateral primary osteoarthritis, right hip: Secondary | ICD-10-CM

## 2020-07-03 DIAGNOSIS — Z7401 Bed confinement status: Secondary | ICD-10-CM

## 2020-07-08 DIAGNOSIS — Z87891 Personal history of nicotine dependence: Secondary | ICD-10-CM | POA: Diagnosis not present

## 2020-07-08 DIAGNOSIS — K219 Gastro-esophageal reflux disease without esophagitis: Secondary | ICD-10-CM | POA: Diagnosis not present

## 2020-07-08 DIAGNOSIS — D649 Anemia, unspecified: Secondary | ICD-10-CM | POA: Diagnosis not present

## 2020-07-08 DIAGNOSIS — G4733 Obstructive sleep apnea (adult) (pediatric): Secondary | ICD-10-CM | POA: Diagnosis not present

## 2020-07-08 DIAGNOSIS — Z6841 Body Mass Index (BMI) 40.0 and over, adult: Secondary | ICD-10-CM | POA: Diagnosis not present

## 2020-07-08 DIAGNOSIS — I10 Essential (primary) hypertension: Secondary | ICD-10-CM | POA: Diagnosis not present

## 2020-07-08 DIAGNOSIS — M150 Primary generalized (osteo)arthritis: Secondary | ICD-10-CM | POA: Diagnosis not present

## 2020-07-08 DIAGNOSIS — Z9181 History of falling: Secondary | ICD-10-CM | POA: Diagnosis not present

## 2020-07-09 DIAGNOSIS — Z6841 Body Mass Index (BMI) 40.0 and over, adult: Secondary | ICD-10-CM | POA: Diagnosis not present

## 2020-07-09 DIAGNOSIS — K219 Gastro-esophageal reflux disease without esophagitis: Secondary | ICD-10-CM | POA: Diagnosis not present

## 2020-07-09 DIAGNOSIS — I10 Essential (primary) hypertension: Secondary | ICD-10-CM | POA: Diagnosis not present

## 2020-07-09 DIAGNOSIS — M150 Primary generalized (osteo)arthritis: Secondary | ICD-10-CM | POA: Diagnosis not present

## 2020-07-09 DIAGNOSIS — G4733 Obstructive sleep apnea (adult) (pediatric): Secondary | ICD-10-CM | POA: Diagnosis not present

## 2020-07-09 DIAGNOSIS — D649 Anemia, unspecified: Secondary | ICD-10-CM | POA: Diagnosis not present

## 2020-07-09 DIAGNOSIS — Z9181 History of falling: Secondary | ICD-10-CM | POA: Diagnosis not present

## 2020-07-09 DIAGNOSIS — Z87891 Personal history of nicotine dependence: Secondary | ICD-10-CM | POA: Diagnosis not present

## 2020-07-09 NOTE — Telephone Encounter (Signed)
Called and left message for OT. I am out of office today but left her my cell phone number for her to reach me.

## 2020-07-11 DIAGNOSIS — M6281 Muscle weakness (generalized): Secondary | ICD-10-CM | POA: Diagnosis not present

## 2020-07-14 ENCOUNTER — Other Ambulatory Visit: Payer: Self-pay | Admitting: Family Medicine

## 2020-07-16 DIAGNOSIS — D649 Anemia, unspecified: Secondary | ICD-10-CM | POA: Diagnosis not present

## 2020-07-16 DIAGNOSIS — I10 Essential (primary) hypertension: Secondary | ICD-10-CM | POA: Diagnosis not present

## 2020-07-16 DIAGNOSIS — Z87891 Personal history of nicotine dependence: Secondary | ICD-10-CM | POA: Diagnosis not present

## 2020-07-16 DIAGNOSIS — G4733 Obstructive sleep apnea (adult) (pediatric): Secondary | ICD-10-CM | POA: Diagnosis not present

## 2020-07-16 DIAGNOSIS — M150 Primary generalized (osteo)arthritis: Secondary | ICD-10-CM | POA: Diagnosis not present

## 2020-07-16 DIAGNOSIS — K219 Gastro-esophageal reflux disease without esophagitis: Secondary | ICD-10-CM | POA: Diagnosis not present

## 2020-07-16 DIAGNOSIS — Z6841 Body Mass Index (BMI) 40.0 and over, adult: Secondary | ICD-10-CM | POA: Diagnosis not present

## 2020-07-16 DIAGNOSIS — Z9181 History of falling: Secondary | ICD-10-CM | POA: Diagnosis not present

## 2020-07-19 ENCOUNTER — Other Ambulatory Visit: Payer: Self-pay | Admitting: Family Medicine

## 2020-07-19 DIAGNOSIS — M25551 Pain in right hip: Secondary | ICD-10-CM

## 2020-07-19 DIAGNOSIS — G8929 Other chronic pain: Secondary | ICD-10-CM

## 2020-07-20 DIAGNOSIS — M1611 Unilateral primary osteoarthritis, right hip: Secondary | ICD-10-CM | POA: Diagnosis not present

## 2020-07-20 NOTE — Telephone Encounter (Signed)
Refill request Lidocaine patch Last office visit 06/27/20 video Medication is no longer on list

## 2020-07-22 DIAGNOSIS — Z6841 Body Mass Index (BMI) 40.0 and over, adult: Secondary | ICD-10-CM | POA: Diagnosis not present

## 2020-07-22 DIAGNOSIS — M150 Primary generalized (osteo)arthritis: Secondary | ICD-10-CM | POA: Diagnosis not present

## 2020-07-22 DIAGNOSIS — Z9181 History of falling: Secondary | ICD-10-CM | POA: Diagnosis not present

## 2020-07-22 DIAGNOSIS — I10 Essential (primary) hypertension: Secondary | ICD-10-CM | POA: Diagnosis not present

## 2020-07-22 DIAGNOSIS — K219 Gastro-esophageal reflux disease without esophagitis: Secondary | ICD-10-CM | POA: Diagnosis not present

## 2020-07-22 DIAGNOSIS — Z87891 Personal history of nicotine dependence: Secondary | ICD-10-CM | POA: Diagnosis not present

## 2020-07-22 DIAGNOSIS — D649 Anemia, unspecified: Secondary | ICD-10-CM | POA: Diagnosis not present

## 2020-07-22 DIAGNOSIS — G4733 Obstructive sleep apnea (adult) (pediatric): Secondary | ICD-10-CM | POA: Diagnosis not present

## 2020-07-23 ENCOUNTER — Other Ambulatory Visit: Payer: Self-pay | Admitting: Family Medicine

## 2020-07-23 NOTE — Telephone Encounter (Signed)
Pt called needing to get a refill on  Oxycodone  Pt will be out in next couple days  [facility]  [address]

## 2020-07-24 ENCOUNTER — Other Ambulatory Visit: Payer: Self-pay | Admitting: Family Medicine

## 2020-07-24 DIAGNOSIS — M1611 Unilateral primary osteoarthritis, right hip: Secondary | ICD-10-CM

## 2020-07-24 MED ORDER — OXYCODONE-ACETAMINOPHEN 10-325 MG PO TABS: 1.0000 | ORAL_TABLET | Freq: Four times a day (QID) | ORAL | 0 refills | Status: DC | PRN

## 2020-07-24 NOTE — Telephone Encounter (Signed)
Pt called checking on rx He will be out of meds on Sunday  Please advise when this has been sent in

## 2020-07-24 NOTE — Telephone Encounter (Signed)
Advised pt. Pt verbalized understanding.

## 2020-07-24 NOTE — Telephone Encounter (Signed)
Please call patient and let him know that his medication has been sent to his pharmacy.

## 2020-07-29 ENCOUNTER — Other Ambulatory Visit: Payer: Self-pay

## 2020-07-29 ENCOUNTER — Encounter: Payer: Self-pay | Admitting: Physical Medicine & Rehabilitation

## 2020-07-29 ENCOUNTER — Encounter: Payer: Medicare HMO | Attending: Physical Medicine & Rehabilitation | Admitting: Physical Medicine & Rehabilitation

## 2020-07-29 VITALS — Ht 76.0 in | Wt >= 6400 oz

## 2020-07-29 DIAGNOSIS — M62838 Other muscle spasm: Secondary | ICD-10-CM | POA: Diagnosis not present

## 2020-07-29 DIAGNOSIS — M1611 Unilateral primary osteoarthritis, right hip: Secondary | ICD-10-CM | POA: Diagnosis not present

## 2020-07-29 DIAGNOSIS — G4733 Obstructive sleep apnea (adult) (pediatric): Secondary | ICD-10-CM | POA: Diagnosis not present

## 2020-07-29 DIAGNOSIS — Z6841 Body Mass Index (BMI) 40.0 and over, adult: Secondary | ICD-10-CM | POA: Diagnosis not present

## 2020-07-29 DIAGNOSIS — I10 Essential (primary) hypertension: Secondary | ICD-10-CM | POA: Diagnosis not present

## 2020-07-29 DIAGNOSIS — K219 Gastro-esophageal reflux disease without esophagitis: Secondary | ICD-10-CM | POA: Diagnosis not present

## 2020-07-29 DIAGNOSIS — M150 Primary generalized (osteo)arthritis: Secondary | ICD-10-CM | POA: Diagnosis not present

## 2020-07-29 DIAGNOSIS — D649 Anemia, unspecified: Secondary | ICD-10-CM | POA: Diagnosis not present

## 2020-07-29 DIAGNOSIS — Z9181 History of falling: Secondary | ICD-10-CM | POA: Diagnosis not present

## 2020-07-29 DIAGNOSIS — Z87891 Personal history of nicotine dependence: Secondary | ICD-10-CM | POA: Diagnosis not present

## 2020-07-29 MED ORDER — TIZANIDINE HCL 4 MG PO TABS: 4.0000 mg | ORAL_TABLET | Freq: Three times a day (TID) | ORAL | 3 refills | Status: DC | PRN

## 2020-07-29 NOTE — Progress Notes (Signed)
Subjective:    Patient ID: Michael Payne, male    DOB: 10/26/1970, 49 y.o.   MRN: [id number]  HPI   The patient has consented to a tele-health/telephone encounter with [facility]. The patient is at home and the provider is at the office. Two separate patient identifiers were utilized to confirm the identity of the patient.    This is an initial visit with [name].  We are meeting via televisit today as he is unable physically to come to this office.  He is a 49 year old African-American male with morbid obesity,( most recent weight on record is 225 kg).  He has been increasingly degenerative right hip.  Most recent hip CT is from December 27, 2019 which demonstrates severe right hip osteoarthritis with joint space narrowing, osteophytes, subchondral sclerosis and cyst formation.  No fracture was seen.  Due to his increased pain he is unable to get out of bed very much.  Currently he is receiving physical therapy once per week.  They were there yesterday.  I asked him what they were doing, and they were addressing bed mobility and range of motion exercises for his lower extremities.  He states that when he is assisted to the edge of the bed he can maintain seated position.  I asked him how he feels after moving his legs or working with therapy and he states that "I typically pay for it afterwards for a few days".  He has been followed chronically by nurse practitioner Olean Ree at  [facility].  Currently he is using Percocet 10/325 1 every 6 hours.  He takes it at 6 and 12 regularly.  He also takes tizanidine 2 mg twice daily.  Uses lidocaine patch over his hip daily.  He is also on diclofenac 75 mg twice daily as needed and Tylenol 650 mg every 6 hours as needed.  He tells me that the tizanidine does help with his spasms.  The Percocet gives him about 4 hours of relief after which she will take a Tylenol to get through to the next dose of Percocet.  I  asked him if he did use any long-acting medications in the past he did recall using a fentanyl patch at some point.  He does not recall the dose.  Patient does understand that he needs to see orthopedics ultimately to assess him for surgical intervention but has difficulty due to transportation and his size.  Pain Inventory Average Pain 8 Pain Right Now 8 My pain is sharp, dull, stabbing and tingling  In the last 24 hours, has pain interfered with the following? General activity 1 Relation with others 1 Enjoyment of life 0 What TIME of day is your pain at its worst? night Sleep (in general) Poor  Pain is worse with: sitting and inactivity Pain improves with: rest and therapy/exercise Relief from Meds: 6  how many minutes can you walk? 0 ability to climb steps?  no do you drive?  no  disabled: date disabled 6 years I need assistance with the following:  dressing, bathing, toileting, meal prep, household duties and shopping  numbness  Any changes since last visit?  no  Primary care Olean Ree FNP    Family History  Problem Relation Age of Onset  . Heart attack Mother 67  . Lung cancer Father   . Cancer Father   . CAD Maternal Grandfather    Social History   Socioeconomic History  . Marital status: Married  Spouse name: Not on file  . Number of children: 2  . Years of education: Not on file  . Highest education level: Not on file  Occupational History  . Occupation: disabled  Tobacco Use  . Smoking status: Former Smoker    Packs/day: 2.00    Years: 22.00    Pack years: 44.00    Types: Cigarettes    Quit date: 02/24/2011    Years since quitting: 9.4  . Smokeless tobacco: Never Used  Substance and Sexual Activity  . Alcohol use: Not Currently    Comment: Weekends/beer-- has slowed down  alot  . Drug use: Not Currently    Types: Marijuana    Comment: former -  quit in 2012  . Sexual activity: Yes    Partners: Female  Other Topics Concern  . Not on  file  Social History Narrative  . Not on file   Social Determinants of Health   Financial Resource Strain: Low Risk   . Difficulty of Paying Living Expenses: Not hard at all  Food Insecurity: No Food Insecurity  . Worried About Programme researcher, broadcasting/film/video in the Last Year: Never true  . Ran Out of Food in the Last Year: Never true  Transportation Needs: No Transportation Needs  . Lack of Transportation (Medical): No  . Lack of Transportation (Non-Medical): No  Physical Activity: Inactive  . Days of Exercise per Week: 0 days  . Minutes of Exercise per Session: 0 min  Stress: No Stress Concern Present  . Feeling of Stress : Not at all  Social Connections:   . Frequency of Communication with Friends and Family: Not on file  . Frequency of Social Gatherings with Friends and Family: Not on file  . Attends Religious Services: Not on file  . Active Member of Clubs or Organizations: Not on file  . Attends Banker Meetings: Not on file  . Marital Status: Not on file   Past Surgical History:  Procedure Laterality Date  . FOOT SURGERY    . KNEE ARTHROSCOPY WITH MEDIAL MENISECTOMY Left 12/16/2014   Procedure: KNEE ARTHROSCOPY WITH MEDIAL MENISECTOMY;  Surgeon: Sheral Apley, MD;  Location: [facility] OR;  Service: Orthopedics;  Laterality: Left;   Past Medical History:  Diagnosis Date  . Anemia    borderline anemia, was instructed to eat more iron filled foods  . GERD (gastroesophageal reflux disease)    lactose intolerance  . HTN (hypertension)    Ht 6\' 4"  (1.93 m) Comment: last recorded  Wt (!) 496 lb (225 kg) Comment: last recorded -pt is bedbound  BMI 60.38 kg/m   Opioid Risk Score:   Fall Risk Score:  `1  Depression screen PHQ 2/9  Depression screen [facility] 2/9 07/29/2020 03/24/2020 05/27/2015 02/18/2015 11/12/2014  Decreased Interest 2 0 0 0 0  Down, Depressed, Hopeless 0 0 0 0 0  PHQ - 2 Score 2 0 0 0 0  Altered sleeping 3 0 - - -  Tired, decreased energy 1 0 - - -  Change in  appetite 0 0 - - -  Feeling bad or failure about yourself  0 0 - - -  Trouble concentrating 0 0 - - -  Moving slowly or fidgety/restless 0 0 - - -  Suicidal thoughts 0 0 - - -  PHQ-9 Score 6 0 - - -  Difficult doing work/chores - Not difficult at all - - -   Review of Systems  Constitutional: Negative.   HENT: Negative.  Eyes: Negative.   Respiratory: Negative.   Cardiovascular: Negative.   Gastrointestinal: Negative.   Endocrine: Negative.   Genitourinary: Negative.   Musculoskeletal: Positive for arthralgias and gait problem.  Skin: Negative.   Allergic/Immunologic: Negative.   Neurological: Positive for numbness.  Hematological: Negative.   Psychiatric/Behavioral: Positive for dysphoric mood.       Mild  All other systems reviewed and are negative.      Objective:   Physical Exam n/a       Assessment & Plan:  1.  End-stage osteoarthritis of the right hip 2.  Morbid obesity    Plan: 1.  Increased tizanidine to 4 mg every 8 hours as needed as this may help a bit more with spasm around the hip joint. 2.  Encouraged him to stay as active as possible even on his own at home even if he is a bit sore after activity. 3 continue Voltaren and lidocaine as prescribed. 4.  I think he might benefit from another trial of a long-acting opioid.  Since he does well with the oxycodone we might want to try Xtampza 13.5 mg every 12 hours to start.  He could then attempt to reduce the as needed Percocet to every 8 hours.  I did not prescribe this medication as his nurse practitioner has been his controlled substance provider to this point.  I have reached out to Olean Ree regarding these recommendations. 5.  Ultimately a surgical intervention would be the most pain relieving solution for him.  However he is likely not a surgical candidate given his size.  He would ultimately need to discuss surgical outcome and safety considerations with any surgeon who would consider his  case.  Otherwise it was a pleasure to meet Mr. Cotterman.  I hope our meeting provided him some benefit.  I did not schedule a follow-up at this time.  I will review the case further with his nurse practitioner Olean Ree first.  25 minutes of face to face patient care time were spent during this visit. All questions were encouraged and answered.

## 2020-08-03 ENCOUNTER — Other Ambulatory Visit: Payer: Self-pay | Admitting: Family Medicine

## 2020-08-03 DIAGNOSIS — Z9181 History of falling: Secondary | ICD-10-CM | POA: Diagnosis not present

## 2020-08-03 DIAGNOSIS — M150 Primary generalized (osteo)arthritis: Secondary | ICD-10-CM | POA: Diagnosis not present

## 2020-08-03 DIAGNOSIS — I10 Essential (primary) hypertension: Secondary | ICD-10-CM | POA: Diagnosis not present

## 2020-08-03 DIAGNOSIS — D649 Anemia, unspecified: Secondary | ICD-10-CM | POA: Diagnosis not present

## 2020-08-03 DIAGNOSIS — K219 Gastro-esophageal reflux disease without esophagitis: Secondary | ICD-10-CM | POA: Diagnosis not present

## 2020-08-03 DIAGNOSIS — G4733 Obstructive sleep apnea (adult) (pediatric): Secondary | ICD-10-CM | POA: Diagnosis not present

## 2020-08-03 DIAGNOSIS — Z87891 Personal history of nicotine dependence: Secondary | ICD-10-CM | POA: Diagnosis not present

## 2020-08-03 DIAGNOSIS — Z6841 Body Mass Index (BMI) 40.0 and over, adult: Secondary | ICD-10-CM | POA: Diagnosis not present

## 2020-08-04 ENCOUNTER — Telehealth: Payer: Self-pay

## 2020-08-04 NOTE — Telephone Encounter (Signed)
Michael Payne PT with [facility] left v/m requesting verbal orders to continue [name] PT 1 x a wk for 8 wks to continue focusing on strength so pt can get out of bed; pts is making slow progress.

## 2020-08-04 NOTE — Telephone Encounter (Signed)
Called and left message to [place] request for extending PT.

## 2020-08-06 DIAGNOSIS — G4733 Obstructive sleep apnea (adult) (pediatric): Secondary | ICD-10-CM | POA: Diagnosis not present

## 2020-08-06 DIAGNOSIS — D649 Anemia, unspecified: Secondary | ICD-10-CM | POA: Diagnosis not present

## 2020-08-06 DIAGNOSIS — I10 Essential (primary) hypertension: Secondary | ICD-10-CM | POA: Diagnosis not present

## 2020-08-06 DIAGNOSIS — Z6841 Body Mass Index (BMI) 40.0 and over, adult: Secondary | ICD-10-CM | POA: Diagnosis not present

## 2020-08-06 DIAGNOSIS — Z9181 History of falling: Secondary | ICD-10-CM | POA: Diagnosis not present

## 2020-08-06 DIAGNOSIS — K219 Gastro-esophageal reflux disease without esophagitis: Secondary | ICD-10-CM | POA: Diagnosis not present

## 2020-08-06 DIAGNOSIS — M150 Primary generalized (osteo)arthritis: Secondary | ICD-10-CM | POA: Diagnosis not present

## 2020-08-06 DIAGNOSIS — Z87891 Personal history of nicotine dependence: Secondary | ICD-10-CM | POA: Diagnosis not present

## 2020-08-07 ENCOUNTER — Telehealth: Payer: Self-pay

## 2020-08-07 NOTE — Telephone Encounter (Signed)
[facility] with [facility] left v/m requesting verbal orders for [facility] OT 1 x a wk for 8 wks;  Due to over compensation of upper body due to pt being off of rt hip HH OT is going to be focusing on pain mgt for Lt shoulder.

## 2020-08-08 DIAGNOSIS — D649 Anemia, unspecified: Secondary | ICD-10-CM | POA: Diagnosis not present

## 2020-08-08 DIAGNOSIS — K219 Gastro-esophageal reflux disease without esophagitis: Secondary | ICD-10-CM | POA: Diagnosis not present

## 2020-08-08 DIAGNOSIS — I10 Essential (primary) hypertension: Secondary | ICD-10-CM | POA: Diagnosis not present

## 2020-08-08 DIAGNOSIS — Z6841 Body Mass Index (BMI) 40.0 and over, adult: Secondary | ICD-10-CM | POA: Diagnosis not present

## 2020-08-08 DIAGNOSIS — Z9181 History of falling: Secondary | ICD-10-CM | POA: Diagnosis not present

## 2020-08-08 DIAGNOSIS — M150 Primary generalized (osteo)arthritis: Secondary | ICD-10-CM | POA: Diagnosis not present

## 2020-08-08 DIAGNOSIS — Z87891 Personal history of nicotine dependence: Secondary | ICD-10-CM | POA: Diagnosis not present

## 2020-08-08 DIAGNOSIS — G4733 Obstructive sleep apnea (adult) (pediatric): Secondary | ICD-10-CM | POA: Diagnosis not present

## 2020-08-10 NOTE — Telephone Encounter (Signed)
Called and spoke with Harriett Sine, provided verbal orders. Received update on patient's condition. He is awaiting telephone consultation with Dr. Rosita Kea (orthopedic surgery).

## 2020-08-11 DIAGNOSIS — M6281 Muscle weakness (generalized): Secondary | ICD-10-CM | POA: Diagnosis not present

## 2020-08-12 DIAGNOSIS — D649 Anemia, unspecified: Secondary | ICD-10-CM | POA: Diagnosis not present

## 2020-08-12 DIAGNOSIS — M150 Primary generalized (osteo)arthritis: Secondary | ICD-10-CM | POA: Diagnosis not present

## 2020-08-12 DIAGNOSIS — Z6841 Body Mass Index (BMI) 40.0 and over, adult: Secondary | ICD-10-CM | POA: Diagnosis not present

## 2020-08-12 DIAGNOSIS — Z87891 Personal history of nicotine dependence: Secondary | ICD-10-CM | POA: Diagnosis not present

## 2020-08-12 DIAGNOSIS — I10 Essential (primary) hypertension: Secondary | ICD-10-CM | POA: Diagnosis not present

## 2020-08-12 DIAGNOSIS — K219 Gastro-esophageal reflux disease without esophagitis: Secondary | ICD-10-CM | POA: Diagnosis not present

## 2020-08-12 DIAGNOSIS — Z9181 History of falling: Secondary | ICD-10-CM | POA: Diagnosis not present

## 2020-08-12 DIAGNOSIS — G4733 Obstructive sleep apnea (adult) (pediatric): Secondary | ICD-10-CM | POA: Diagnosis not present

## 2020-08-14 DIAGNOSIS — K219 Gastro-esophageal reflux disease without esophagitis: Secondary | ICD-10-CM | POA: Diagnosis not present

## 2020-08-14 DIAGNOSIS — Z9181 History of falling: Secondary | ICD-10-CM | POA: Diagnosis not present

## 2020-08-14 DIAGNOSIS — Z6841 Body Mass Index (BMI) 40.0 and over, adult: Secondary | ICD-10-CM | POA: Diagnosis not present

## 2020-08-14 DIAGNOSIS — M150 Primary generalized (osteo)arthritis: Secondary | ICD-10-CM | POA: Diagnosis not present

## 2020-08-14 DIAGNOSIS — Z87891 Personal history of nicotine dependence: Secondary | ICD-10-CM | POA: Diagnosis not present

## 2020-08-14 DIAGNOSIS — G4733 Obstructive sleep apnea (adult) (pediatric): Secondary | ICD-10-CM | POA: Diagnosis not present

## 2020-08-14 DIAGNOSIS — D649 Anemia, unspecified: Secondary | ICD-10-CM | POA: Diagnosis not present

## 2020-08-14 DIAGNOSIS — I10 Essential (primary) hypertension: Secondary | ICD-10-CM | POA: Diagnosis not present

## 2020-08-18 DIAGNOSIS — I10 Essential (primary) hypertension: Secondary | ICD-10-CM | POA: Diagnosis not present

## 2020-08-18 DIAGNOSIS — Z87891 Personal history of nicotine dependence: Secondary | ICD-10-CM | POA: Diagnosis not present

## 2020-08-18 DIAGNOSIS — M150 Primary generalized (osteo)arthritis: Secondary | ICD-10-CM | POA: Diagnosis not present

## 2020-08-18 DIAGNOSIS — K219 Gastro-esophageal reflux disease without esophagitis: Secondary | ICD-10-CM | POA: Diagnosis not present

## 2020-08-18 DIAGNOSIS — G4733 Obstructive sleep apnea (adult) (pediatric): Secondary | ICD-10-CM | POA: Diagnosis not present

## 2020-08-18 DIAGNOSIS — Z9181 History of falling: Secondary | ICD-10-CM | POA: Diagnosis not present

## 2020-08-18 DIAGNOSIS — D649 Anemia, unspecified: Secondary | ICD-10-CM | POA: Diagnosis not present

## 2020-08-18 DIAGNOSIS — Z6841 Body Mass Index (BMI) 40.0 and over, adult: Secondary | ICD-10-CM | POA: Diagnosis not present

## 2020-08-20 ENCOUNTER — Telehealth: Payer: Self-pay | Admitting: Family Medicine

## 2020-08-20 NOTE — Telephone Encounter (Signed)
Pt called in wanted to get a refill oin his oxycodone.  [facility] [address] [place], [place], [place] [address]  Contact number [phone]

## 2020-08-21 ENCOUNTER — Other Ambulatory Visit: Payer: Self-pay | Admitting: Family Medicine

## 2020-08-21 ENCOUNTER — Telehealth: Payer: Self-pay | Admitting: *Deleted

## 2020-08-21 DIAGNOSIS — G4733 Obstructive sleep apnea (adult) (pediatric): Secondary | ICD-10-CM | POA: Diagnosis not present

## 2020-08-21 DIAGNOSIS — D649 Anemia, unspecified: Secondary | ICD-10-CM | POA: Diagnosis not present

## 2020-08-21 DIAGNOSIS — K219 Gastro-esophageal reflux disease without esophagitis: Secondary | ICD-10-CM | POA: Diagnosis not present

## 2020-08-21 DIAGNOSIS — Z87891 Personal history of nicotine dependence: Secondary | ICD-10-CM | POA: Diagnosis not present

## 2020-08-21 DIAGNOSIS — Z9181 History of falling: Secondary | ICD-10-CM | POA: Diagnosis not present

## 2020-08-21 DIAGNOSIS — M150 Primary generalized (osteo)arthritis: Secondary | ICD-10-CM | POA: Diagnosis not present

## 2020-08-21 DIAGNOSIS — Z6841 Body Mass Index (BMI) 40.0 and over, adult: Secondary | ICD-10-CM | POA: Diagnosis not present

## 2020-08-21 DIAGNOSIS — I10 Essential (primary) hypertension: Secondary | ICD-10-CM | POA: Diagnosis not present

## 2020-08-21 DIAGNOSIS — M1611 Unilateral primary osteoarthritis, right hip: Secondary | ICD-10-CM

## 2020-08-21 NOTE — Telephone Encounter (Signed)
I spoke to his NP, Deboraha Sprang about my medication recommendations after his evaluation. She should still be writing his pain medications.

## 2020-08-21 NOTE — Telephone Encounter (Signed)
The best contact number is [phone]

## 2020-08-21 NOTE — Telephone Encounter (Signed)
Michael Payne called about his oxycodone.  It was not clear who will be prescribing by his last visit note.

## 2020-08-22 ENCOUNTER — Other Ambulatory Visit: Payer: Self-pay | Admitting: Family Medicine

## 2020-08-22 ENCOUNTER — Encounter: Payer: Self-pay | Admitting: Family Medicine

## 2020-08-22 DIAGNOSIS — M1611 Unilateral primary osteoarthritis, right hip: Secondary | ICD-10-CM

## 2020-08-22 MED ORDER — OXYCODONE-ACETAMINOPHEN 10-325 MG PO TABS
1.0000 | ORAL_TABLET | Freq: Four times a day (QID) | ORAL | 0 refills | Status: DC | PRN
Start: 2020-08-22 — End: 2020-09-02

## 2020-08-22 NOTE — Telephone Encounter (Signed)
Refill sent to patient's pharmacy and a mychart message was sent and he was instructed to call and schedule a follow-up appointment for pain management prior to my last day.

## 2020-08-24 NOTE — Telephone Encounter (Signed)
[facility] Night - Client TELEPHONE ADVICE RECORD [facility] Patient Name: Michael Payne Gender: Male DOB: [birth year] Age: 49 Y 11 M 2 D Return Phone Number: [phone] (Primary) Address: City/State/Zip: [place] [address] Client [facility] Night - Client Client Site [facility] - Night Physician Deboraha Sprang- NP Contact Type Call Who Is Calling Patient / Member / Family / Caregiver Call Type Triage / Clinical Relationship To Patient Self Return Phone Number [phone] (Primary) Chief Complaint Prescription Refill or Medication Request (non symptomatic) Reason for Call Medication Question / Request Initial Comment Caller states called on thursday for a refill, and has not received a call back. Translation No Nurse Assessment Nurse: Lucretia Roers, RN, Fuller Song Date/Time Lamount Cohen Time): 08/22/2020 12:07:25 PM Confirm and document reason for call. If symptomatic, describe symptoms. ---Caller states called on Thursday for a refill, and had not received a call back. During return call, patient stated that someone had just called him back and taken care of it. Does the patient have any new or worsening symptoms? ---No Please document clinical information provided and list any resource used. ---Caller stated that it had already been taken care of. Disp. Time Lamount Cohen Time) Disposition Final User 08/22/2020 12:09:03 PM Clinical Call Yes Lucretia Roers, RN, Fuller Song

## 2020-08-25 DIAGNOSIS — K219 Gastro-esophageal reflux disease without esophagitis: Secondary | ICD-10-CM | POA: Diagnosis not present

## 2020-08-25 DIAGNOSIS — I10 Essential (primary) hypertension: Secondary | ICD-10-CM | POA: Diagnosis not present

## 2020-08-25 DIAGNOSIS — Z6841 Body Mass Index (BMI) 40.0 and over, adult: Secondary | ICD-10-CM | POA: Diagnosis not present

## 2020-08-25 DIAGNOSIS — G4733 Obstructive sleep apnea (adult) (pediatric): Secondary | ICD-10-CM | POA: Diagnosis not present

## 2020-08-25 DIAGNOSIS — Z9181 History of falling: Secondary | ICD-10-CM | POA: Diagnosis not present

## 2020-08-25 DIAGNOSIS — Z87891 Personal history of nicotine dependence: Secondary | ICD-10-CM | POA: Diagnosis not present

## 2020-08-25 DIAGNOSIS — M150 Primary generalized (osteo)arthritis: Secondary | ICD-10-CM | POA: Diagnosis not present

## 2020-08-25 DIAGNOSIS — D649 Anemia, unspecified: Secondary | ICD-10-CM | POA: Diagnosis not present

## 2020-08-26 DIAGNOSIS — D649 Anemia, unspecified: Secondary | ICD-10-CM | POA: Diagnosis not present

## 2020-08-26 DIAGNOSIS — M150 Primary generalized (osteo)arthritis: Secondary | ICD-10-CM | POA: Diagnosis not present

## 2020-08-26 DIAGNOSIS — G4733 Obstructive sleep apnea (adult) (pediatric): Secondary | ICD-10-CM | POA: Diagnosis not present

## 2020-08-26 DIAGNOSIS — Z6841 Body Mass Index (BMI) 40.0 and over, adult: Secondary | ICD-10-CM | POA: Diagnosis not present

## 2020-08-26 DIAGNOSIS — I10 Essential (primary) hypertension: Secondary | ICD-10-CM | POA: Diagnosis not present

## 2020-08-26 DIAGNOSIS — Z9181 History of falling: Secondary | ICD-10-CM | POA: Diagnosis not present

## 2020-08-26 DIAGNOSIS — K219 Gastro-esophageal reflux disease without esophagitis: Secondary | ICD-10-CM | POA: Diagnosis not present

## 2020-08-26 DIAGNOSIS — Z87891 Personal history of nicotine dependence: Secondary | ICD-10-CM | POA: Diagnosis not present

## 2020-08-27 DIAGNOSIS — K219 Gastro-esophageal reflux disease without esophagitis: Secondary | ICD-10-CM | POA: Diagnosis not present

## 2020-08-27 DIAGNOSIS — I10 Essential (primary) hypertension: Secondary | ICD-10-CM | POA: Diagnosis not present

## 2020-08-27 DIAGNOSIS — Z6841 Body Mass Index (BMI) 40.0 and over, adult: Secondary | ICD-10-CM | POA: Diagnosis not present

## 2020-08-27 DIAGNOSIS — M150 Primary generalized (osteo)arthritis: Secondary | ICD-10-CM | POA: Diagnosis not present

## 2020-08-27 DIAGNOSIS — G4733 Obstructive sleep apnea (adult) (pediatric): Secondary | ICD-10-CM | POA: Diagnosis not present

## 2020-08-27 DIAGNOSIS — Z9181 History of falling: Secondary | ICD-10-CM | POA: Diagnosis not present

## 2020-08-27 DIAGNOSIS — D649 Anemia, unspecified: Secondary | ICD-10-CM | POA: Diagnosis not present

## 2020-08-27 DIAGNOSIS — Z87891 Personal history of nicotine dependence: Secondary | ICD-10-CM | POA: Diagnosis not present

## 2020-09-01 DIAGNOSIS — Z87891 Personal history of nicotine dependence: Secondary | ICD-10-CM | POA: Diagnosis not present

## 2020-09-01 DIAGNOSIS — G4733 Obstructive sleep apnea (adult) (pediatric): Secondary | ICD-10-CM | POA: Diagnosis not present

## 2020-09-01 DIAGNOSIS — D649 Anemia, unspecified: Secondary | ICD-10-CM | POA: Diagnosis not present

## 2020-09-01 DIAGNOSIS — Z9181 History of falling: Secondary | ICD-10-CM | POA: Diagnosis not present

## 2020-09-01 DIAGNOSIS — Z6841 Body Mass Index (BMI) 40.0 and over, adult: Secondary | ICD-10-CM | POA: Diagnosis not present

## 2020-09-01 DIAGNOSIS — K219 Gastro-esophageal reflux disease without esophagitis: Secondary | ICD-10-CM | POA: Diagnosis not present

## 2020-09-01 DIAGNOSIS — M150 Primary generalized (osteo)arthritis: Secondary | ICD-10-CM | POA: Diagnosis not present

## 2020-09-01 DIAGNOSIS — I10 Essential (primary) hypertension: Secondary | ICD-10-CM | POA: Diagnosis not present

## 2020-09-02 ENCOUNTER — Encounter: Payer: Self-pay | Admitting: Family Medicine

## 2020-09-02 ENCOUNTER — Other Ambulatory Visit: Payer: Self-pay

## 2020-09-02 ENCOUNTER — Telehealth (INDEPENDENT_AMBULATORY_CARE_PROVIDER_SITE_OTHER): Payer: Medicare HMO | Admitting: Family Medicine

## 2020-09-02 VITALS — Ht 76.0 in | Wt >= 6400 oz

## 2020-09-02 DIAGNOSIS — Z6841 Body Mass Index (BMI) 40.0 and over, adult: Secondary | ICD-10-CM | POA: Diagnosis not present

## 2020-09-02 DIAGNOSIS — F1190 Opioid use, unspecified, uncomplicated: Secondary | ICD-10-CM | POA: Diagnosis not present

## 2020-09-02 DIAGNOSIS — D649 Anemia, unspecified: Secondary | ICD-10-CM | POA: Diagnosis not present

## 2020-09-02 DIAGNOSIS — R262 Difficulty in walking, not elsewhere classified: Secondary | ICD-10-CM

## 2020-09-02 DIAGNOSIS — Z9181 History of falling: Secondary | ICD-10-CM | POA: Diagnosis not present

## 2020-09-02 DIAGNOSIS — M1611 Unilateral primary osteoarthritis, right hip: Secondary | ICD-10-CM

## 2020-09-02 DIAGNOSIS — K219 Gastro-esophageal reflux disease without esophagitis: Secondary | ICD-10-CM | POA: Diagnosis not present

## 2020-09-02 DIAGNOSIS — I10 Essential (primary) hypertension: Secondary | ICD-10-CM | POA: Diagnosis not present

## 2020-09-02 DIAGNOSIS — G4733 Obstructive sleep apnea (adult) (pediatric): Secondary | ICD-10-CM | POA: Diagnosis not present

## 2020-09-02 DIAGNOSIS — M62838 Other muscle spasm: Secondary | ICD-10-CM

## 2020-09-02 DIAGNOSIS — M150 Primary generalized (osteo)arthritis: Secondary | ICD-10-CM | POA: Diagnosis not present

## 2020-09-02 DIAGNOSIS — Z87891 Personal history of nicotine dependence: Secondary | ICD-10-CM | POA: Diagnosis not present

## 2020-09-02 MED ORDER — NALOXONE HCL 4 MG/0.1ML NA LIQD: NASAL | 1 refills | Status: AC

## 2020-09-02 MED ORDER — OXYCODONE-ACETAMINOPHEN 5-325 MG PO TABS: 1.0000 | ORAL_TABLET | Freq: Two times a day (BID) | ORAL | 0 refills | Status: DC | PRN

## 2020-09-02 MED ORDER — OXYCODONE ER 13.5 MG PO C12A
13.5000 mg | EXTENDED_RELEASE_CAPSULE | Freq: Two times a day (BID) | ORAL | 0 refills | Status: DC
Start: 2020-09-02 — End: 2020-11-05

## 2020-09-02 MED ORDER — OXYCODONE-ACETAMINOPHEN 5-325 MG PO TABS
1.0000 | ORAL_TABLET | Freq: Two times a day (BID) | ORAL | 0 refills | Status: DC | PRN
Start: 2020-09-02 — End: 2020-11-05

## 2020-09-02 MED ORDER — TIZANIDINE HCL 4 MG PO TABS: 4.0000 mg | ORAL_TABLET | Freq: Three times a day (TID) | ORAL | 3 refills | Status: DC | PRN

## 2020-09-02 NOTE — Patient Instructions (Addendum)
Naloxone nasal spray What is this medicine? NALOXONE (nal OX one) is a narcotic blocker. It is used to treat narcotic drug overdose. This medicine may be used for other purposes; ask your health care provider or pharmacist if you have questions. COMMON BRAND NAME(S): Narcan What should I tell my health care provider before I take this medicine? They need to know if you have any of these conditions:  drug abuse or addiction  heart disease  an unusual or allergic reaction to naloxone, other medicines, foods, dyes, or preservatives  pregnant or trying to get pregnant  breast-feeding How should I use this medicine? This medicine is for use in the nose. Lay the person on their back. Support their neck with your hand and allow the head to tilt back before giving the medicine. The nasal spray should be given into 1 nostril. After giving the medicine, move the person onto their side. Do not remove or test the nasal spray until ready to use. Get emergency medical help right away after giving the first dose of this medicine, even if the person wakes up. You should be familiar with how to recognize the signs and symptoms of a narcotic overdose. If more doses are needed, give the additional dose in the other nostril. Talk to your pediatrician regarding the use of this medicine in children. While this drug may be prescribed for children as young as newborns for selected conditions, precautions do apply. Overdosage: If you think you have taken too much of this medicine contact a poison control center or emergency room at once. NOTE: This medicine is only for you. Do not share this medicine with others. What if I miss a dose? This does not apply. What may interact with this medicine? This medicine is only used during an emergency. No interactions are expected during emergency use. This list may not describe all possible interactions. Give your health care provider a list of all the medicines, herbs,  non-prescription drugs, or dietary supplements you use. Also tell them if you smoke, drink alcohol, or use illegal drugs. Some items may interact with your medicine. What should I watch for while using this medicine? Keep this medicine ready for use in the case of a narcotic overdose. Make sure that you have the phone number of your doctor or health care professional and local hospital ready. You may need to have additional doses of this medicine. Each nasal spray contains a single dose. Some emergencies may require additional doses. After use, bring the treated person to the nearest hospital or call 911. Make sure the treating health care professional knows that the person has received an injection of this medicine. You will receive additional instructions on what to do during and after use of this medicine before an emergency occurs. What side effects may I notice from receiving this medicine? Side effects that you should report to your doctor or health care professional as soon as possible:  allergic reactions like skin rash, itching or hives, swelling of the face, lips, or tongue  breathing problems  fast, irregular heartbeat  high blood pressure  pain that was controlled by narcotic pain medicine  seizures Side effects that usually do not require medical attention (report to your doctor or health care professional if they continue or are bothersome):  anxious  chills  diarrhea  fever  headache  muscle pain  nausea, vomiting  nose irritation like dryness, congestion, and swelling  sweating This list may not describe all possible side  effects. Call your doctor for medical advice about side effects. You may report side effects to FDA at 1-800-FDA-1088. Where should I keep my medicine? Keep out of the reach of children. Store between 4 and 40 degrees C (39 and 104 degrees F). Do not freeze. Throw away any unused medicine after the expiration date. Keep in original box until  ready to use. NOTE: This sheet is a summary. It may not cover all possible information. If you have questions about this medicine, talk to your doctor, pharmacist, or health care provider.  2020 Elsevier/Gold Standard (2015-10-09 13:55:10)

## 2020-09-02 NOTE — Progress Notes (Signed)
Virtual Visit via Video Note  I connected with Michael Payne on 09/02/20 at 10:15 AM EST by a video enabled telemedicine application and verified that I am speaking with the correct person using two identifiers.  Due to technical difficulties, we were unable to establish a video connection and visit was completed with audio only.  Location: Patient: In his home Provider: [facility] Persons participating in virtual visit: Patient, provider   I discussed the limitations of evaluation and management by telemedicine and the availability of in person appointments. The patient expressed understanding and agreed to proceed.  History of Present Illness: Chief Complaint  Patient presents with  . Pain Management   This is a 49 yo male who presents today for virtual visit for chronic medical conditions.   Chronic hip pain-he has severe right hip osteoarthritis.  He is currently taking Percocet 10-3 25 every 6 hours.  He did a virtual visit with physical medicine rehabilitation specialist, Dr. Harold Hedge who made some recommendations regarding changing him to a long-acting opioid with as needed Percocet.  He is also taking tizanidine 4 mg every 8 hours as needed.  The patient reports that he has been out of his tizanidine although there is an active prescription from Dr. Hermelinda Medicus from last month.  The patient does feel that it provides him with some relief.  He also uses lidocaine patches.  The patient denies constipation, reporting that he is having a daily bowel movement.  The goal is for him to lose enough weight to be able to have a hip replacement.  He requests prescription for new bariatric walker.  He reports that his was lost during hospitalization.  Immobility-patient was admitted to a skilled nursing facility following a fall earlier this year.  Since that time he has been immobile due to severe pain and weakness.  He is currently being followed by home health PT/OT/personal care.   The patient feels like he is making good progress.  He is able to sit on the side of the bed twice a week in the PT and OT aids are able to assist him.  He feels that he is getting a little bit of increased leg mobility.  He has an over the bed trapeze which is helping him with upper extremity strength.  Mood-the patient reports that his mood is okay that he is very hopeful and motivated that with continued work he will be able to become mobile again.   Observations/Objective: Patient is alert and answers questions appropriately.  He is normally conversive without increased work of breathing.  Mood and affect are appropriate. Ht 6\' 4"  (1.93 m)   Wt (!) 490 lb (222.3 kg)   BMI 59.64 kg/m  Wt Readings from Last 3 Encounters:  09/02/20 (!) 490 lb (222.3 kg)  07/29/20 (!) 496 lb (225 kg)  04/22/20 (!) 496 lb (225 kg)    Assessment and Plan: .1. Unilateral osteoarthritis of hip, right -Discussed with Dr. 06/22/20 who will be taking over patient's care. -Discussed change in pain medication with the patient, will be going to long acting oxycodone that he is to take every 12 hours and will decrease his as needed short acting oxycodone for approximate equivalent milligram, 37 mg/day begin (prior 40 mg) - prescription for Narcan sent for patient to have in the home and I have mailed him information regarding use and advised him to share with his wife - oxyCODONE ER 13.5 MG C12A; Take 13.5 mg by mouth  every 12 (twelve) hours.  Dispense: 60 capsule; Refill: 0 - oxyCODONE ER 13.5 MG C12A; Take 13.5 mg by mouth every 12 (twelve) hours.  Dispense: 60 capsule; Refill: 0 - oxyCODONE ER 13.5 MG C12A; Take 13.5 mg by mouth every 12 (twelve) hours.  Dispense: 60 capsule; Refill: 0 - oxyCODONE-acetaminophen (PERCOCET/ROXICET) 5-325 MG tablet; Take 1 tablet by mouth 2 (two) times daily as needed for severe pain.  Dispense: 60 tablet; Refill: 0 - oxyCODONE-acetaminophen (PERCOCET/ROXICET) 5-325 MG tablet;  Take 1 tablet by mouth 2 (two) times daily as needed for severe pain.  Dispense: 60 tablet; Refill: 0 - oxyCODONE-acetaminophen (PERCOCET/ROXICET) 5-325 MG tablet; Take 1 tablet by mouth 2 (two) times daily as needed for severe pain.  Dispense: 60 tablet; Refill: 0 - For home use only [facility] - tiZANidine (ZANAFLEX) 4 MG tablet; Take 1 tablet (4 mg total) by mouth every 8 (eight) hours as needed for muscle spasms.  Dispense: 90 tablet; Refill: 3 -Follow-up in 3 months with Dr. Para March  2. Chronic, continuous use of opioids - naloxone (NARCAN) nasal spray 4 mg/0.1 mL; Spray nostril if needed for opioid overuse- decreased consciousness, decreased respirations  Dispense: 2 each; Refill: 1  3. Morbid obesity (HCC) -He is going to be seen by home health nutritionist later this month.  He reports that he is very motivated to make changes in his diet - For home use only [facility]ME [facility]  4. Impaired ambulation - For home use only [facility]ME [facility]  5. Muscle spasms of both lower extremities - tiZANidine (ZANAFLEX) 4 MG tablet; Take 1 tablet (4 mg total) by mouth every 8 (eight) hours as needed for muscle spasms.  Dispense: 90 tablet; Refill: 3   Olean Ree, FNP-BC  [facility], [facility]  09/05/2020 8:26 AM   Follow Up Instructions:    I discussed the assessment and treatment plan with the patient. The patient was provided an opportunity to ask questions and all were answered. The patient agreed with the plan and demonstrated an understanding of the instructions.   The patient was advised to call back or seek an in-person evaluation if the symptoms worsen or if the condition fails to improve as anticipated.    Emi Belfast, FNP

## 2020-09-07 DIAGNOSIS — Z87891 Personal history of nicotine dependence: Secondary | ICD-10-CM | POA: Diagnosis not present

## 2020-09-07 DIAGNOSIS — D649 Anemia, unspecified: Secondary | ICD-10-CM | POA: Diagnosis not present

## 2020-09-07 DIAGNOSIS — Z6841 Body Mass Index (BMI) 40.0 and over, adult: Secondary | ICD-10-CM | POA: Diagnosis not present

## 2020-09-07 DIAGNOSIS — K219 Gastro-esophageal reflux disease without esophagitis: Secondary | ICD-10-CM | POA: Diagnosis not present

## 2020-09-07 DIAGNOSIS — M150 Primary generalized (osteo)arthritis: Secondary | ICD-10-CM | POA: Diagnosis not present

## 2020-09-07 DIAGNOSIS — G4733 Obstructive sleep apnea (adult) (pediatric): Secondary | ICD-10-CM | POA: Diagnosis not present

## 2020-09-07 DIAGNOSIS — Z9181 History of falling: Secondary | ICD-10-CM | POA: Diagnosis not present

## 2020-09-07 DIAGNOSIS — I10 Essential (primary) hypertension: Secondary | ICD-10-CM | POA: Diagnosis not present

## 2020-09-08 DIAGNOSIS — G4733 Obstructive sleep apnea (adult) (pediatric): Secondary | ICD-10-CM | POA: Diagnosis not present

## 2020-09-08 DIAGNOSIS — M1611 Unilateral primary osteoarthritis, right hip: Secondary | ICD-10-CM | POA: Diagnosis not present

## 2020-09-08 DIAGNOSIS — K219 Gastro-esophageal reflux disease without esophagitis: Secondary | ICD-10-CM | POA: Diagnosis not present

## 2020-09-08 DIAGNOSIS — I10 Essential (primary) hypertension: Secondary | ICD-10-CM | POA: Diagnosis not present

## 2020-09-08 DIAGNOSIS — Z87891 Personal history of nicotine dependence: Secondary | ICD-10-CM | POA: Diagnosis not present

## 2020-09-08 DIAGNOSIS — Z6841 Body Mass Index (BMI) 40.0 and over, adult: Secondary | ICD-10-CM | POA: Diagnosis not present

## 2020-09-08 DIAGNOSIS — D649 Anemia, unspecified: Secondary | ICD-10-CM | POA: Diagnosis not present

## 2020-09-08 DIAGNOSIS — M150 Primary generalized (osteo)arthritis: Secondary | ICD-10-CM | POA: Diagnosis not present

## 2020-09-08 DIAGNOSIS — Z9181 History of falling: Secondary | ICD-10-CM | POA: Diagnosis not present

## 2020-09-09 ENCOUNTER — Other Ambulatory Visit: Payer: Self-pay | Admitting: Family Medicine

## 2020-09-09 ENCOUNTER — Telehealth: Payer: Self-pay

## 2020-09-09 DIAGNOSIS — M1611 Unilateral primary osteoarthritis, right hip: Secondary | ICD-10-CM

## 2020-09-09 DIAGNOSIS — R262 Difficulty in walking, not elsewhere classified: Secondary | ICD-10-CM

## 2020-09-09 NOTE — Progress Notes (Signed)
RX for walker

## 2020-09-09 NOTE — Telephone Encounter (Signed)
Spoke with Michael Payne and patient. Patient needs a bariatric rolling walker. [employer] [facility] did not have this available. I called several places in [place] and [place]. Sending RX order for walker, office note, demographics/insurance information to [employer] in [place] and they will process request and see if insurance will approve. Their fax # [phone]. Patient aware of the process and that it is not a guarantee that insurance would pay at 100%.

## 2020-09-10 DIAGNOSIS — M6281 Muscle weakness (generalized): Secondary | ICD-10-CM | POA: Diagnosis not present

## 2020-09-10 NOTE — Telephone Encounter (Signed)
Last office visit 09/02/2020 for pain management.  Last refilled 06/27/2020 for #60 with 2 refills.

## 2020-09-14 DIAGNOSIS — I10 Essential (primary) hypertension: Secondary | ICD-10-CM | POA: Diagnosis not present

## 2020-09-14 DIAGNOSIS — G4733 Obstructive sleep apnea (adult) (pediatric): Secondary | ICD-10-CM | POA: Diagnosis not present

## 2020-09-14 DIAGNOSIS — Z6841 Body Mass Index (BMI) 40.0 and over, adult: Secondary | ICD-10-CM | POA: Diagnosis not present

## 2020-09-14 DIAGNOSIS — D649 Anemia, unspecified: Secondary | ICD-10-CM | POA: Diagnosis not present

## 2020-09-14 DIAGNOSIS — Z87891 Personal history of nicotine dependence: Secondary | ICD-10-CM | POA: Diagnosis not present

## 2020-09-14 DIAGNOSIS — K219 Gastro-esophageal reflux disease without esophagitis: Secondary | ICD-10-CM | POA: Diagnosis not present

## 2020-09-14 DIAGNOSIS — M150 Primary generalized (osteo)arthritis: Secondary | ICD-10-CM | POA: Diagnosis not present

## 2020-09-14 DIAGNOSIS — Z9181 History of falling: Secondary | ICD-10-CM | POA: Diagnosis not present

## 2020-09-15 ENCOUNTER — Telehealth: Payer: Self-pay | Admitting: General Practice

## 2020-09-15 ENCOUNTER — Telehealth: Payer: Self-pay

## 2020-09-15 DIAGNOSIS — I10 Essential (primary) hypertension: Secondary | ICD-10-CM | POA: Diagnosis not present

## 2020-09-15 DIAGNOSIS — Z6841 Body Mass Index (BMI) 40.0 and over, adult: Secondary | ICD-10-CM | POA: Diagnosis not present

## 2020-09-15 DIAGNOSIS — Z9181 History of falling: Secondary | ICD-10-CM | POA: Diagnosis not present

## 2020-09-15 DIAGNOSIS — Z87891 Personal history of nicotine dependence: Secondary | ICD-10-CM | POA: Diagnosis not present

## 2020-09-15 DIAGNOSIS — K219 Gastro-esophageal reflux disease without esophagitis: Secondary | ICD-10-CM | POA: Diagnosis not present

## 2020-09-15 DIAGNOSIS — M150 Primary generalized (osteo)arthritis: Secondary | ICD-10-CM | POA: Diagnosis not present

## 2020-09-15 DIAGNOSIS — D649 Anemia, unspecified: Secondary | ICD-10-CM | POA: Diagnosis not present

## 2020-09-15 DIAGNOSIS — G4733 Obstructive sleep apnea (adult) (pediatric): Secondary | ICD-10-CM | POA: Diagnosis not present

## 2020-09-15 NOTE — Telephone Encounter (Signed)
Pt called in wanted to know about getting a new company for a  new aide to come out to help him due to the one he is with does not have people to come out to help him .

## 2020-09-15 NOTE — Telephone Encounter (Signed)
Appreciate support finding care providers at home.

## 2020-09-15 NOTE — Telephone Encounter (Signed)
Spoke with patient. Patient does have PT HH with [facility] and for home aid he has been using [facility] services but they have been short on CNA staff and wanted to see if there is another agency that can help. Called [employer] and they do not have availablilty right now. Called [facility] and spoke with Dale Marion she took down the information and will call me back to let me know if theu can help. Patient was getting 7 days a week and 2 hours and 15 to 30 minutes a day of CNA assist- 73 hours a month.

## 2020-09-15 NOTE — Telephone Encounter (Signed)
Michael Payne received an email from the OT assistant stating the wife voiced several concerns about the patient to him today: 1. She wants a Child psychotherapist to help with options for care moving forward. 2. He has swelling in his left lower leg. Wants to rule out a blood clot and have blood work done if possible. Would like nursing added.  Wife said she is afraid he is going to die in bed waiting for the next step.  [name] is asking for verbal orders for skilled nursing and social work or does he need to come to the office to be evaluated. Please call her at [phone].

## 2020-09-16 NOTE — Telephone Encounter (Signed)
Agree. Thanks

## 2020-09-16 NOTE — Telephone Encounter (Signed)
Ok for skilled nursing and social work as requested.   Will also route to Dr. Para March. Pt will need an appointment to evaluate the leg.

## 2020-09-16 NOTE — Telephone Encounter (Signed)
Does not look like that this got routed yesterday.

## 2020-09-16 NOTE — Telephone Encounter (Signed)
Followed up with Michael Payne at [facility] and called [facility] in [place], they do not have the staffing right now either but will keep patient on their list to keep trying and will update me if they have something open up. I spoke with patient and let him know I am trying but so far not able to find someone, I asked patient to let me know if he hears from an agency or from [facility] and gets CNA help so I can let these agencies know not to look for help.

## 2020-09-16 NOTE — Telephone Encounter (Signed)
Agreed.  Please give the orders and he needs eval sooner rather than later for his leg.  That could be at our clinic or at [facility].

## 2020-09-16 NOTE — Telephone Encounter (Signed)
Darlene clinical mgr [facility] notified as instructed giving VO for [facility] skilled nursing and social work. [name] will let family know he needs to get lt lower leg cked at [facility] or ED. Darlene does not know any other symptoms than lt lower leg swelling. I called pt because no DPR signed.I spoke with pt; advised pt per Dr Lianne Bushy order and pt voiced understanding. Pt said he did not know his lt leg was swelling and no pain in lt lower leg; pt said no redness and leg does not feel warm. Pt is not having CP or SOB, pt said he would have his wife look at his leg just to verify what he is saying. UC & ED precautions given and pt voiced understanding.I also notified Darlene clinic mgr at [facility]. [name] is not sure when pt will have visit from skilled nursing visits but is going to try to get nurse out to see pt this weekend. Sending note to Dr Para March.

## 2020-09-17 DIAGNOSIS — R269 Unspecified abnormalities of gait and mobility: Secondary | ICD-10-CM | POA: Diagnosis not present

## 2020-09-17 DIAGNOSIS — M1631 Unilateral osteoarthritis resulting from hip dysplasia, right hip: Secondary | ICD-10-CM | POA: Diagnosis not present

## 2020-09-20 DIAGNOSIS — K219 Gastro-esophageal reflux disease without esophagitis: Secondary | ICD-10-CM | POA: Diagnosis not present

## 2020-09-20 DIAGNOSIS — Z87891 Personal history of nicotine dependence: Secondary | ICD-10-CM | POA: Diagnosis not present

## 2020-09-20 DIAGNOSIS — I10 Essential (primary) hypertension: Secondary | ICD-10-CM | POA: Diagnosis not present

## 2020-09-20 DIAGNOSIS — Z9181 History of falling: Secondary | ICD-10-CM | POA: Diagnosis not present

## 2020-09-20 DIAGNOSIS — G4733 Obstructive sleep apnea (adult) (pediatric): Secondary | ICD-10-CM | POA: Diagnosis not present

## 2020-09-20 DIAGNOSIS — M150 Primary generalized (osteo)arthritis: Secondary | ICD-10-CM | POA: Diagnosis not present

## 2020-09-20 DIAGNOSIS — Z6841 Body Mass Index (BMI) 40.0 and over, adult: Secondary | ICD-10-CM | POA: Diagnosis not present

## 2020-09-20 DIAGNOSIS — D649 Anemia, unspecified: Secondary | ICD-10-CM | POA: Diagnosis not present

## 2020-09-21 ENCOUNTER — Telehealth: Payer: Self-pay | Admitting: Family Medicine

## 2020-09-21 NOTE — Telephone Encounter (Signed)
-----   Message from Joaquim Nam, MD sent at 09/16/2020  8:37 PM EST ----- Please call patient in January and schedule f/u with me in Feb 2022.  Routine pain med f/u and to est with me.  Thanks.

## 2020-09-21 NOTE — Telephone Encounter (Signed)
Left a message to call office

## 2020-09-22 ENCOUNTER — Telehealth: Payer: Self-pay | Admitting: Family Medicine

## 2020-09-22 NOTE — Telephone Encounter (Signed)
Tonya from [facility] is calling in stating they need orders for In Home Nursing for once a week for two weeks for Disease Management. Please Advise EM

## 2020-09-22 NOTE — Telephone Encounter (Signed)
They need a order for a Incentive spirometer Can they use it for pneumonia prevention

## 2020-09-23 DIAGNOSIS — G4733 Obstructive sleep apnea (adult) (pediatric): Secondary | ICD-10-CM | POA: Diagnosis not present

## 2020-09-23 DIAGNOSIS — Z87891 Personal history of nicotine dependence: Secondary | ICD-10-CM | POA: Diagnosis not present

## 2020-09-23 DIAGNOSIS — I10 Essential (primary) hypertension: Secondary | ICD-10-CM | POA: Diagnosis not present

## 2020-09-23 DIAGNOSIS — Z6841 Body Mass Index (BMI) 40.0 and over, adult: Secondary | ICD-10-CM | POA: Diagnosis not present

## 2020-09-23 DIAGNOSIS — D649 Anemia, unspecified: Secondary | ICD-10-CM | POA: Diagnosis not present

## 2020-09-23 DIAGNOSIS — K219 Gastro-esophageal reflux disease without esophagitis: Secondary | ICD-10-CM | POA: Diagnosis not present

## 2020-09-23 DIAGNOSIS — Z9181 History of falling: Secondary | ICD-10-CM | POA: Diagnosis not present

## 2020-09-23 DIAGNOSIS — M150 Primary generalized (osteo)arthritis: Secondary | ICD-10-CM | POA: Diagnosis not present

## 2020-09-23 NOTE — Telephone Encounter (Signed)
Approved in home nursing and for incentive spirometry use for PNA prevention.

## 2020-09-25 NOTE — Telephone Encounter (Signed)
Called Archie Patten with [facility] to give verbal orders per Mayra Reel, NP. Per Archie Patten, patient also tested positive for Covid this week. Attempted to call patient to get an update on his current status. Left voicemail for patient to return call to office.

## 2020-09-25 NOTE — Telephone Encounter (Signed)
Left message for Darlene with [facility] to see if they have been able to help patient find a CNA, not sure if patient has spoken with them about this. I am not able to find anyone to help with places I called.

## 2020-09-28 ENCOUNTER — Telehealth: Payer: Self-pay | Admitting: Family Medicine

## 2020-09-28 NOTE — Telephone Encounter (Signed)
Please call patient and have him schedule f/u visit with new pcp.   SW delay noted

## 2020-09-28 NOTE — Telephone Encounter (Signed)
SW with [facility] called to notify PCP that there will be delay in social work visit as patient has covid.

## 2020-10-02 ENCOUNTER — Telehealth: Payer: Self-pay

## 2020-10-02 NOTE — Telephone Encounter (Signed)
Michael Payne with [facility] called requesting verbal orders giving permission to move home visit next week as patient is still in quarantine following positive covid...Marland Kitchen please advise

## 2020-10-02 NOTE — Telephone Encounter (Signed)
Called patient to schedule TOC with new PCP now that Debbie left. LVM to call back.

## 2020-10-02 NOTE — Telephone Encounter (Signed)
Ok to move to next week. Also strongly encourage that pt schedule TOC with new provider

## 2020-10-06 DIAGNOSIS — M150 Primary generalized (osteo)arthritis: Secondary | ICD-10-CM | POA: Diagnosis not present

## 2020-10-06 DIAGNOSIS — K219 Gastro-esophageal reflux disease without esophagitis: Secondary | ICD-10-CM | POA: Diagnosis not present

## 2020-10-06 DIAGNOSIS — D649 Anemia, unspecified: Secondary | ICD-10-CM | POA: Diagnosis not present

## 2020-10-06 DIAGNOSIS — G4733 Obstructive sleep apnea (adult) (pediatric): Secondary | ICD-10-CM | POA: Diagnosis not present

## 2020-10-06 DIAGNOSIS — I10 Essential (primary) hypertension: Secondary | ICD-10-CM | POA: Diagnosis not present

## 2020-10-06 DIAGNOSIS — Z87891 Personal history of nicotine dependence: Secondary | ICD-10-CM | POA: Diagnosis not present

## 2020-10-06 DIAGNOSIS — Z6841 Body Mass Index (BMI) 40.0 and over, adult: Secondary | ICD-10-CM | POA: Diagnosis not present

## 2020-10-06 DIAGNOSIS — Z9181 History of falling: Secondary | ICD-10-CM | POA: Diagnosis not present

## 2020-10-06 NOTE — Telephone Encounter (Signed)
Cara left a voicemail requesting a call back today because patient's date for re certification will expire today. Called Cara back and gave her the message from Dr. Selena Batten and she verbalized understanding

## 2020-10-07 DIAGNOSIS — K219 Gastro-esophageal reflux disease without esophagitis: Secondary | ICD-10-CM | POA: Diagnosis not present

## 2020-10-07 DIAGNOSIS — Z87891 Personal history of nicotine dependence: Secondary | ICD-10-CM | POA: Diagnosis not present

## 2020-10-07 DIAGNOSIS — D649 Anemia, unspecified: Secondary | ICD-10-CM | POA: Diagnosis not present

## 2020-10-07 DIAGNOSIS — M150 Primary generalized (osteo)arthritis: Secondary | ICD-10-CM | POA: Diagnosis not present

## 2020-10-07 DIAGNOSIS — Z9181 History of falling: Secondary | ICD-10-CM | POA: Diagnosis not present

## 2020-10-07 DIAGNOSIS — G4733 Obstructive sleep apnea (adult) (pediatric): Secondary | ICD-10-CM | POA: Diagnosis not present

## 2020-10-07 DIAGNOSIS — Z6841 Body Mass Index (BMI) 40.0 and over, adult: Secondary | ICD-10-CM | POA: Diagnosis not present

## 2020-10-07 DIAGNOSIS — I10 Essential (primary) hypertension: Secondary | ICD-10-CM | POA: Diagnosis not present

## 2020-10-07 NOTE — Telephone Encounter (Signed)
See other telephone note. Patient has social work assessment through [facility] scheduled but has been delayed due to patient is positive for COVID

## 2020-10-09 DIAGNOSIS — M1611 Unilateral primary osteoarthritis, right hip: Secondary | ICD-10-CM | POA: Diagnosis not present

## 2020-10-13 DIAGNOSIS — M150 Primary generalized (osteo)arthritis: Secondary | ICD-10-CM | POA: Diagnosis not present

## 2020-10-13 DIAGNOSIS — I10 Essential (primary) hypertension: Secondary | ICD-10-CM | POA: Diagnosis not present

## 2020-10-13 DIAGNOSIS — K219 Gastro-esophageal reflux disease without esophagitis: Secondary | ICD-10-CM | POA: Diagnosis not present

## 2020-10-13 DIAGNOSIS — Z9181 History of falling: Secondary | ICD-10-CM | POA: Diagnosis not present

## 2020-10-13 DIAGNOSIS — G4733 Obstructive sleep apnea (adult) (pediatric): Secondary | ICD-10-CM | POA: Diagnosis not present

## 2020-10-13 DIAGNOSIS — Z6841 Body Mass Index (BMI) 40.0 and over, adult: Secondary | ICD-10-CM | POA: Diagnosis not present

## 2020-10-13 DIAGNOSIS — Z87891 Personal history of nicotine dependence: Secondary | ICD-10-CM | POA: Diagnosis not present

## 2020-10-13 DIAGNOSIS — D649 Anemia, unspecified: Secondary | ICD-10-CM | POA: Diagnosis not present

## 2020-10-15 ENCOUNTER — Telehealth: Payer: Self-pay

## 2020-10-15 DIAGNOSIS — K219 Gastro-esophageal reflux disease without esophagitis: Secondary | ICD-10-CM | POA: Diagnosis not present

## 2020-10-15 DIAGNOSIS — Z6841 Body Mass Index (BMI) 40.0 and over, adult: Secondary | ICD-10-CM | POA: Diagnosis not present

## 2020-10-15 DIAGNOSIS — Z87891 Personal history of nicotine dependence: Secondary | ICD-10-CM | POA: Diagnosis not present

## 2020-10-15 DIAGNOSIS — M150 Primary generalized (osteo)arthritis: Secondary | ICD-10-CM | POA: Diagnosis not present

## 2020-10-15 DIAGNOSIS — Z9181 History of falling: Secondary | ICD-10-CM | POA: Diagnosis not present

## 2020-10-15 DIAGNOSIS — G4733 Obstructive sleep apnea (adult) (pediatric): Secondary | ICD-10-CM | POA: Diagnosis not present

## 2020-10-15 DIAGNOSIS — I10 Essential (primary) hypertension: Secondary | ICD-10-CM | POA: Diagnosis not present

## 2020-10-15 DIAGNOSIS — D649 Anemia, unspecified: Secondary | ICD-10-CM | POA: Diagnosis not present

## 2020-10-15 MED ORDER — AMLODIPINE BESYLATE 10 MG PO TABS: 10.0000 mg | ORAL_TABLET | Freq: Every day | ORAL | 1 refills | Status: DC

## 2020-10-15 NOTE — Telephone Encounter (Signed)
Pharmacy requests refill on: Amlodipine 10 mg   LAST REFILL: 04/10/2020 (Q-90, R-1) LAST OV: 09/02/2020 NEXT OV: 10/30/2020 PHARMACY: [facility]

## 2020-10-16 DIAGNOSIS — K219 Gastro-esophageal reflux disease without esophagitis: Secondary | ICD-10-CM | POA: Diagnosis not present

## 2020-10-16 DIAGNOSIS — D649 Anemia, unspecified: Secondary | ICD-10-CM | POA: Diagnosis not present

## 2020-10-16 DIAGNOSIS — I10 Essential (primary) hypertension: Secondary | ICD-10-CM | POA: Diagnosis not present

## 2020-10-16 DIAGNOSIS — Z87891 Personal history of nicotine dependence: Secondary | ICD-10-CM | POA: Diagnosis not present

## 2020-10-16 DIAGNOSIS — Z6841 Body Mass Index (BMI) 40.0 and over, adult: Secondary | ICD-10-CM | POA: Diagnosis not present

## 2020-10-16 DIAGNOSIS — G4733 Obstructive sleep apnea (adult) (pediatric): Secondary | ICD-10-CM | POA: Diagnosis not present

## 2020-10-16 DIAGNOSIS — M150 Primary generalized (osteo)arthritis: Secondary | ICD-10-CM | POA: Diagnosis not present

## 2020-10-16 DIAGNOSIS — Z9181 History of falling: Secondary | ICD-10-CM | POA: Diagnosis not present

## 2020-10-18 DIAGNOSIS — M1631 Unilateral osteoarthritis resulting from hip dysplasia, right hip: Secondary | ICD-10-CM | POA: Diagnosis not present

## 2020-10-18 DIAGNOSIS — R269 Unspecified abnormalities of gait and mobility: Secondary | ICD-10-CM | POA: Diagnosis not present

## 2020-10-19 ENCOUNTER — Telehealth: Payer: Self-pay | Admitting: *Deleted

## 2020-10-19 NOTE — Telephone Encounter (Signed)
Jae Dire PT with [facility] left a voicemail stating that they need additional orders for PT. Jae Dire is requesting additional PT orders for once a week for 8 weeks.

## 2020-10-20 ENCOUNTER — Telehealth: Payer: Self-pay | Admitting: Family Medicine

## 2020-10-20 DIAGNOSIS — Z87891 Personal history of nicotine dependence: Secondary | ICD-10-CM | POA: Diagnosis not present

## 2020-10-20 DIAGNOSIS — G4733 Obstructive sleep apnea (adult) (pediatric): Secondary | ICD-10-CM | POA: Diagnosis not present

## 2020-10-20 DIAGNOSIS — K219 Gastro-esophageal reflux disease without esophagitis: Secondary | ICD-10-CM | POA: Diagnosis not present

## 2020-10-20 DIAGNOSIS — D649 Anemia, unspecified: Secondary | ICD-10-CM | POA: Diagnosis not present

## 2020-10-20 DIAGNOSIS — Z9181 History of falling: Secondary | ICD-10-CM | POA: Diagnosis not present

## 2020-10-20 DIAGNOSIS — Z6841 Body Mass Index (BMI) 40.0 and over, adult: Secondary | ICD-10-CM | POA: Diagnosis not present

## 2020-10-20 DIAGNOSIS — M150 Primary generalized (osteo)arthritis: Secondary | ICD-10-CM | POA: Diagnosis not present

## 2020-10-20 DIAGNOSIS — I10 Essential (primary) hypertension: Secondary | ICD-10-CM | POA: Diagnosis not present

## 2020-10-20 NOTE — Telephone Encounter (Signed)
Called pt to reschedule it rung and then hung up

## 2020-10-20 NOTE — Telephone Encounter (Signed)
Patient called in stating that he is need of a refill on the following: Oxycodone 5-325 mg  Amlodipine  [facility] [address]

## 2020-10-20 NOTE — Telephone Encounter (Signed)
Please give the order.  Thanks.   

## 2020-10-21 MED ORDER — AMLODIPINE BESYLATE 10 MG PO TABS: 10.0000 mg | ORAL_TABLET | Freq: Every day | ORAL | 1 refills | Status: DC

## 2020-10-21 NOTE — Telephone Encounter (Signed)
Name of Medication: oxycodone     [facility] [address] Last Fill or Written Date and Quantity: #60, BID 09/02/20 Last Office Visit and Type: 05/15/20, VV osteoarthritis Next Office Visit and Type: 10/30/20, TOC  Last Controlled Substance Agreement Date: unable to find Last [id number] to find  Amlodipine was sent on 10/15/20 but sent to [facility]

## 2020-10-21 NOTE — Telephone Encounter (Signed)
Please check with pharmacy.  He should have an oxycodone prescription on file to fill for this month.  Thanks.

## 2020-10-21 NOTE — Addendum Note (Signed)
Addended by: Sherrie George on: 10/21/2020 07:40 AM   Modules accepted: Orders

## 2020-10-21 NOTE — Telephone Encounter (Signed)
Left message on voicemail for Kate/PT to call the office back.

## 2020-10-21 NOTE — Telephone Encounter (Signed)
Contacted pharmacy and they report pt does still have a refill for both oxycodone scripts.   Tried to contact the pt 3 times but each time someone answered the phone and hung up without saying anything. When pt calls back let him know he already has refills at the pharmacy.

## 2020-10-22 NOTE — Telephone Encounter (Signed)
Michael Payne was given verbal orders for patients PT

## 2020-10-27 DIAGNOSIS — Z9181 History of falling: Secondary | ICD-10-CM | POA: Diagnosis not present

## 2020-10-27 DIAGNOSIS — M150 Primary generalized (osteo)arthritis: Secondary | ICD-10-CM | POA: Diagnosis not present

## 2020-10-27 DIAGNOSIS — K219 Gastro-esophageal reflux disease without esophagitis: Secondary | ICD-10-CM | POA: Diagnosis not present

## 2020-10-27 DIAGNOSIS — I10 Essential (primary) hypertension: Secondary | ICD-10-CM | POA: Diagnosis not present

## 2020-10-27 DIAGNOSIS — D649 Anemia, unspecified: Secondary | ICD-10-CM | POA: Diagnosis not present

## 2020-10-27 DIAGNOSIS — Z6841 Body Mass Index (BMI) 40.0 and over, adult: Secondary | ICD-10-CM | POA: Diagnosis not present

## 2020-10-27 DIAGNOSIS — G4733 Obstructive sleep apnea (adult) (pediatric): Secondary | ICD-10-CM | POA: Diagnosis not present

## 2020-10-27 DIAGNOSIS — Z87891 Personal history of nicotine dependence: Secondary | ICD-10-CM | POA: Diagnosis not present

## 2020-10-30 ENCOUNTER — Telehealth: Payer: Medicare HMO | Admitting: Family Medicine

## 2020-10-30 ENCOUNTER — Other Ambulatory Visit: Payer: Self-pay

## 2020-10-30 DIAGNOSIS — K219 Gastro-esophageal reflux disease without esophagitis: Secondary | ICD-10-CM | POA: Diagnosis not present

## 2020-10-30 DIAGNOSIS — M150 Primary generalized (osteo)arthritis: Secondary | ICD-10-CM | POA: Diagnosis not present

## 2020-10-30 DIAGNOSIS — Z6841 Body Mass Index (BMI) 40.0 and over, adult: Secondary | ICD-10-CM | POA: Diagnosis not present

## 2020-10-30 DIAGNOSIS — Z9181 History of falling: Secondary | ICD-10-CM | POA: Diagnosis not present

## 2020-10-30 DIAGNOSIS — I10 Essential (primary) hypertension: Secondary | ICD-10-CM | POA: Diagnosis not present

## 2020-10-30 DIAGNOSIS — Z87891 Personal history of nicotine dependence: Secondary | ICD-10-CM | POA: Diagnosis not present

## 2020-10-30 DIAGNOSIS — G4733 Obstructive sleep apnea (adult) (pediatric): Secondary | ICD-10-CM | POA: Diagnosis not present

## 2020-10-30 DIAGNOSIS — D649 Anemia, unspecified: Secondary | ICD-10-CM | POA: Diagnosis not present

## 2020-10-30 NOTE — Telephone Encounter (Signed)
Called patient to rsc appt. Rsc'd for asap due to patient stating he is needing to have approval for PT as he has not had it in a week+. Patient is asking if new PCP would be able to approve and sign off.

## 2020-10-30 NOTE — Telephone Encounter (Signed)
Don't know what happened here with patients PT; Jae Dire from [facility] called 10/19/20 asking for verbal orders for PT once a week x 8 weeks which was approved. We were finally able to speak with Jae Dire on 10/22/20 and gave the verbal orders for PT. I spoke with patient and advised him they do have the orders for PT and to have them call if there is something else they are needing from our office. He is going to call to get his PT set back up.

## 2020-10-30 NOTE — Telephone Encounter (Signed)
Please give the verbal order, especially since he is rescheduled for the near future.  Thanks.

## 2020-11-02 ENCOUNTER — Telehealth: Payer: Self-pay

## 2020-11-02 DIAGNOSIS — M150 Primary generalized (osteo)arthritis: Secondary | ICD-10-CM | POA: Diagnosis not present

## 2020-11-02 DIAGNOSIS — I10 Essential (primary) hypertension: Secondary | ICD-10-CM | POA: Diagnosis not present

## 2020-11-02 DIAGNOSIS — Z87891 Personal history of nicotine dependence: Secondary | ICD-10-CM | POA: Diagnosis not present

## 2020-11-02 DIAGNOSIS — Z6841 Body Mass Index (BMI) 40.0 and over, adult: Secondary | ICD-10-CM | POA: Diagnosis not present

## 2020-11-02 DIAGNOSIS — Z9181 History of falling: Secondary | ICD-10-CM | POA: Diagnosis not present

## 2020-11-02 DIAGNOSIS — D649 Anemia, unspecified: Secondary | ICD-10-CM | POA: Diagnosis not present

## 2020-11-02 DIAGNOSIS — G4733 Obstructive sleep apnea (adult) (pediatric): Secondary | ICD-10-CM | POA: Diagnosis not present

## 2020-11-02 DIAGNOSIS — K219 Gastro-esophageal reflux disease without esophagitis: Secondary | ICD-10-CM | POA: Diagnosis not present

## 2020-11-02 NOTE — Telephone Encounter (Signed)
Harriett Sine at [facility] called requesting verbal orders for the social worker to come out to the house. They would like to trying to discuss getting patient into skilled nursing if possible.   Callback number for nancy is [phone]

## 2020-11-03 NOTE — Telephone Encounter (Signed)
Please give the order.  Thanks.   

## 2020-11-03 NOTE — Telephone Encounter (Signed)
Left message for Michael Payne to okay social worker to come in to see patient.

## 2020-11-04 DIAGNOSIS — Z6841 Body Mass Index (BMI) 40.0 and over, adult: Secondary | ICD-10-CM | POA: Diagnosis not present

## 2020-11-04 DIAGNOSIS — D649 Anemia, unspecified: Secondary | ICD-10-CM | POA: Diagnosis not present

## 2020-11-04 DIAGNOSIS — Z9181 History of falling: Secondary | ICD-10-CM | POA: Diagnosis not present

## 2020-11-04 DIAGNOSIS — G4733 Obstructive sleep apnea (adult) (pediatric): Secondary | ICD-10-CM | POA: Diagnosis not present

## 2020-11-04 DIAGNOSIS — I10 Essential (primary) hypertension: Secondary | ICD-10-CM | POA: Diagnosis not present

## 2020-11-04 DIAGNOSIS — M150 Primary generalized (osteo)arthritis: Secondary | ICD-10-CM | POA: Diagnosis not present

## 2020-11-04 DIAGNOSIS — K219 Gastro-esophageal reflux disease without esophagitis: Secondary | ICD-10-CM | POA: Diagnosis not present

## 2020-11-04 DIAGNOSIS — Z87891 Personal history of nicotine dependence: Secondary | ICD-10-CM | POA: Diagnosis not present

## 2020-11-05 ENCOUNTER — Other Ambulatory Visit: Payer: Self-pay

## 2020-11-05 ENCOUNTER — Encounter: Payer: Self-pay | Admitting: Family Medicine

## 2020-11-05 ENCOUNTER — Telehealth (INDEPENDENT_AMBULATORY_CARE_PROVIDER_SITE_OTHER): Payer: Medicare HMO | Admitting: Family Medicine

## 2020-11-05 VITALS — BP 127/86 | HR 97 | Ht 76.0 in | Wt >= 6400 oz

## 2020-11-05 DIAGNOSIS — Z87891 Personal history of nicotine dependence: Secondary | ICD-10-CM | POA: Diagnosis not present

## 2020-11-05 DIAGNOSIS — Z9181 History of falling: Secondary | ICD-10-CM | POA: Diagnosis not present

## 2020-11-05 DIAGNOSIS — Z7401 Bed confinement status: Secondary | ICD-10-CM

## 2020-11-05 DIAGNOSIS — D649 Anemia, unspecified: Secondary | ICD-10-CM | POA: Diagnosis not present

## 2020-11-05 DIAGNOSIS — M1611 Unilateral primary osteoarthritis, right hip: Secondary | ICD-10-CM

## 2020-11-05 DIAGNOSIS — I10 Essential (primary) hypertension: Secondary | ICD-10-CM | POA: Diagnosis not present

## 2020-11-05 DIAGNOSIS — K219 Gastro-esophageal reflux disease without esophagitis: Secondary | ICD-10-CM | POA: Diagnosis not present

## 2020-11-05 DIAGNOSIS — G4733 Obstructive sleep apnea (adult) (pediatric): Secondary | ICD-10-CM

## 2020-11-05 DIAGNOSIS — Z6841 Body Mass Index (BMI) 40.0 and over, adult: Secondary | ICD-10-CM | POA: Diagnosis not present

## 2020-11-05 DIAGNOSIS — M150 Primary generalized (osteo)arthritis: Secondary | ICD-10-CM | POA: Diagnosis not present

## 2020-11-05 NOTE — Progress Notes (Signed)
Interactive audio and video telecommunications were attempted between this provider and patient, however failed, due to patient having technical difficulties OR patient did not have access to video capability.  We continued and completed visit with audio only.   Virtual Visit via Telephone Note  I connected with patient on 11/05/20  at 330 pm  by telephone and verified that I am speaking with the correct person using two identifiers.  Location of patient:  Home  Location of MD: [facility] Name of referring provider (if blank then none associated): Names per persons and role in encounter:  MD: Ferd Hibbs, Patient: name listed above.    I discussed the limitations, risks, security and privacy concerns of performing an evaluation and management service by telephone and the availability of in person appointments. I also discussed with the patient that there may be a patient responsible charge related to this service. The patient expressed understanding and agreed to proceed.  CC: Transfer care  History of Present Illness:   Hip pain, right.  Currently taking xtampza BID and 5mg  oxycodone BID.  Pain is all day, worse with weather changes.  Clearly worse on waking.   He wanted to go back to 10mg  oxycodone.  That seemed to have better effect for the patient.  He reported having more sedation with the change to xtampza.   He is still bedbound, at home since 03/2020.  He is asking if you be able to get extra help from a home health aide.    He is off CPAP, and he may need to be set up with nasal cpap.  Would need APAP if possible to account for pressure leaks.  He has not had lab set up/done recently.  We may be able to get that done via home health.  Meds reviewed.  No recent gout flares.  He is homebound due to hip pain and obesity.  He is bedbound.  My understanding is that he is not a candidate for orthopedic surgery for his hip given his weight.  He does not have transport to central  surgery for consideration of bariatric surgery.  I told him I will check on options.  Observations/Objective: No apparent distress Speech normal.  Assessment and Plan:  Chronic pain, not well controlled on xtampza.  I will update Dr. 04/2020 prior to changing his medications.  He has had some mild sedation but no emergent symptoms.  The main issue is that his pain control is not as good with the current regimen.  I will check on possible bariatric options given that he is not currently a candidate for orthopedic surgery.  We will see about getting labs set up via home health and I will check on options regarding CPAP versus APAP settings/supplies.  Follow Up Instructions: see above.    I discussed the assessment and treatment plan with the patient. The patient was provided an opportunity to ask questions and all were answered. The patient agreed with the plan and demonstrated an understanding of the instructions.   The patient was advised to call back or seek an in-person evaluation if the symptoms worsen or if the condition fails to improve as anticipated.  I provided 24 minutes of non-face-to-face time during this encounter.  [place], MD

## 2020-11-05 NOTE — Progress Notes (Signed)
Patient is transferring care to Dr. Para March from [facility]. Patient has no concerns to discuss in today visit.

## 2020-11-06 ENCOUNTER — Telehealth: Payer: Self-pay

## 2020-11-06 DIAGNOSIS — Z6841 Body Mass Index (BMI) 40.0 and over, adult: Secondary | ICD-10-CM | POA: Diagnosis not present

## 2020-11-06 DIAGNOSIS — G4733 Obstructive sleep apnea (adult) (pediatric): Secondary | ICD-10-CM | POA: Diagnosis not present

## 2020-11-06 DIAGNOSIS — Z87891 Personal history of nicotine dependence: Secondary | ICD-10-CM | POA: Diagnosis not present

## 2020-11-06 DIAGNOSIS — D649 Anemia, unspecified: Secondary | ICD-10-CM | POA: Diagnosis not present

## 2020-11-06 DIAGNOSIS — K219 Gastro-esophageal reflux disease without esophagitis: Secondary | ICD-10-CM | POA: Diagnosis not present

## 2020-11-06 DIAGNOSIS — I10 Essential (primary) hypertension: Secondary | ICD-10-CM | POA: Diagnosis not present

## 2020-11-06 DIAGNOSIS — Z9181 History of falling: Secondary | ICD-10-CM | POA: Diagnosis not present

## 2020-11-06 DIAGNOSIS — M150 Primary generalized (osteo)arthritis: Secondary | ICD-10-CM | POA: Diagnosis not present

## 2020-11-06 NOTE — Telephone Encounter (Signed)
Darlene, Consulting civil engineer with [facility] left message on triage VM stating he is having left side skin irritation and breakdown. He does not have a pressure reducing mattress. Wanted to see if Dr Para March would be willing to order one for him. Fax order to [facility] [phone]. You can call Darlene at [phone] with any questions.

## 2020-11-08 ENCOUNTER — Encounter: Payer: Self-pay | Admitting: Family Medicine

## 2020-11-08 ENCOUNTER — Other Ambulatory Visit: Payer: Self-pay | Admitting: Family Medicine

## 2020-11-08 DIAGNOSIS — M1611 Unilateral primary osteoarthritis, right hip: Secondary | ICD-10-CM

## 2020-11-08 MED ORDER — OXYCODONE ER 13.5 MG PO C12A: 13.5000 mg | EXTENDED_RELEASE_CAPSULE | Freq: Two times a day (BID) | ORAL | Status: DC

## 2020-11-08 NOTE — Assessment & Plan Note (Signed)
See above

## 2020-11-08 NOTE — Telephone Encounter (Signed)
Please give the order for pressure reducing mattress.  Other issues to address:   I will update Dr. [name] about his pain medications and situation in general.  He found out that he could get extra help from an aide.  Please check with home health to see what else we need to do about that, if they need an extra order.  Needs labs to be done at home via home health.  Please send an order for c-Met/CBC/TSH/lipid/A1c.  Dx. R 73.9, E 78.5.  Please have them fax the results to me.  Please call central [place] surgery and see what options are for a patient potentially needing bariatric surgery who is currently bedbound and does not have transport to the clinic.  Please contact [facility] at [phone].  He is not using CPAP currently and he may need repeat titration versus AutoPap set up.  Please see what details you can get.  Thanks.

## 2020-11-08 NOTE — Telephone Encounter (Signed)
This patient transferred care to me after his previous nurse practitioner transferred out of our clinic.  I wanted to update you about his situation.  We are trying to see if he can get bariatric surgery evaluation.  We are working on getting labs done via home health and trying to get him extra help with a home health aide.  In the meantime we may need to change his pain medication back to oxycodone 10 mg 4 times daily.  He had some mild sedation with longer acting oxycodone but more importantly his pain control was not as good.  I did not change his medications yet since I wanted to update you first.  I would like your thoughts and I did not want to duplicate any intervention.  Many thanks.

## 2020-11-09 DIAGNOSIS — M1611 Unilateral primary osteoarthritis, right hip: Secondary | ICD-10-CM | POA: Diagnosis not present

## 2020-11-09 NOTE — Telephone Encounter (Signed)
Hey there. Here's a bullet from the A/P from our one televisit last year:   "4.  I think he might benefit from another trial of a long-acting opioid.  Since he does well with the oxycodone we might want to try Xtampza 13.5 mg every 12 hours to start.  He could then attempt to reduce the as needed Percocet to every 8 hours.  I did not prescribe this medication as his nurse practitioner has been his controlled substance provider to this point.  I have reached out to Olean Ree regarding these recommendations."  So from reviewing his current MAR, he's not really on what I had suggested above. Perhaps the 10mg  Oxy IR was tried but not tolerated with Xtampza? That would explain why his pain hasn't been controlled as well.   If you're going back to the oxycodone IR and he feels more comfortable with that, consider increasing to 15mg  IR 4 x daily in the short term.  Hope that helps,  Sincerely,  Zach.

## 2020-11-10 DIAGNOSIS — K219 Gastro-esophageal reflux disease without esophagitis: Secondary | ICD-10-CM | POA: Diagnosis not present

## 2020-11-10 DIAGNOSIS — I10 Essential (primary) hypertension: Secondary | ICD-10-CM | POA: Diagnosis not present

## 2020-11-10 DIAGNOSIS — Z9181 History of falling: Secondary | ICD-10-CM | POA: Diagnosis not present

## 2020-11-10 DIAGNOSIS — G4733 Obstructive sleep apnea (adult) (pediatric): Secondary | ICD-10-CM | POA: Diagnosis not present

## 2020-11-10 DIAGNOSIS — Z87891 Personal history of nicotine dependence: Secondary | ICD-10-CM | POA: Diagnosis not present

## 2020-11-10 DIAGNOSIS — M150 Primary generalized (osteo)arthritis: Secondary | ICD-10-CM | POA: Diagnosis not present

## 2020-11-10 DIAGNOSIS — D649 Anemia, unspecified: Secondary | ICD-10-CM | POA: Diagnosis not present

## 2020-11-10 DIAGNOSIS — Z6841 Body Mass Index (BMI) 40.0 and over, adult: Secondary | ICD-10-CM | POA: Diagnosis not present

## 2020-11-11 ENCOUNTER — Other Ambulatory Visit: Payer: Self-pay

## 2020-11-11 DIAGNOSIS — G8929 Other chronic pain: Secondary | ICD-10-CM

## 2020-11-11 DIAGNOSIS — M25551 Pain in right hip: Secondary | ICD-10-CM

## 2020-11-11 NOTE — Telephone Encounter (Signed)
Pharmacy requests refill on: Lidocaine 5% Patch   LAST REFILL: 07/20/2020 (Q-30, R-2) LAST OV: 11/05/2020 NEXT OV: Not Scheduled  [id number] [place] [place]

## 2020-11-12 DIAGNOSIS — D649 Anemia, unspecified: Secondary | ICD-10-CM | POA: Diagnosis not present

## 2020-11-12 DIAGNOSIS — Z9181 History of falling: Secondary | ICD-10-CM | POA: Diagnosis not present

## 2020-11-12 DIAGNOSIS — Z6841 Body Mass Index (BMI) 40.0 and over, adult: Secondary | ICD-10-CM | POA: Diagnosis not present

## 2020-11-12 DIAGNOSIS — Z87891 Personal history of nicotine dependence: Secondary | ICD-10-CM | POA: Diagnosis not present

## 2020-11-12 DIAGNOSIS — G4733 Obstructive sleep apnea (adult) (pediatric): Secondary | ICD-10-CM | POA: Diagnosis not present

## 2020-11-12 DIAGNOSIS — M150 Primary generalized (osteo)arthritis: Secondary | ICD-10-CM | POA: Diagnosis not present

## 2020-11-12 DIAGNOSIS — K219 Gastro-esophageal reflux disease without esophagitis: Secondary | ICD-10-CM | POA: Diagnosis not present

## 2020-11-12 DIAGNOSIS — I10 Essential (primary) hypertension: Secondary | ICD-10-CM | POA: Diagnosis not present

## 2020-11-12 MED ORDER — LIDOCAINE 5 % EX PTCH: 1.0000 | MEDICATED_PATCH | CUTANEOUS | 2 refills | Status: DC

## 2020-11-12 NOTE — Addendum Note (Signed)
Addended by: Karenann Cai on: 11/12/2020 03:12 PM   Modules accepted: Orders

## 2020-11-12 NOTE — Telephone Encounter (Signed)
Spoke with patient who stated that he is ok with trying that medication regimen. Patient also requested a refill on Diclofenac 75 mg [facility] [address] Please advise.   Pharmacy requests refill on: Diclofenac 75 mg   LAST REFILL: 09/10/2020 (Q-60, R-1) LAST OV: 11/05/2020 NEXT OV: Not Scheduled  [facility] [address]

## 2020-11-12 NOTE — Telephone Encounter (Signed)
Sent. Thanks.   

## 2020-11-12 NOTE — Telephone Encounter (Signed)
Dr. Riley Kill- Thank you for your input.  Since he wasn't getting good pain relief and he was apparently more tired on the longer acting oxycodone/xtampza, I am going to suggest to him to go back on the 10mg  tabs.  I get your point about the transition with 15mg  dosing initially but I will ask patient to limit that to limit sedation possibility.    - please check with patient.  See above and below.  I think it makes sense to transition back to 10mg  oxycodone/325 tylenol 4 times a day.  If he is will to try that, then let me know.  Thanks.

## 2020-11-12 NOTE — Telephone Encounter (Signed)
Called and LVM for patient to call office back.  

## 2020-11-13 MED ORDER — DICLOFENAC SODIUM 75 MG PO TBEC
DELAYED_RELEASE_TABLET | ORAL | 1 refills | Status: DC
Start: 2020-11-13 — End: 2021-01-06

## 2020-11-13 MED ORDER — OXYCODONE-ACETAMINOPHEN 10-325 MG PO TABS: 1.0000 | ORAL_TABLET | Freq: Four times a day (QID) | ORAL | 0 refills | Status: DC | PRN

## 2020-11-13 NOTE — Addendum Note (Signed)
Addended by: Joaquim Nam on: 11/13/2020 05:50 AM   Modules accepted: Orders

## 2020-11-13 NOTE — Telephone Encounter (Signed)
Called and LVM for Darlene, RN with [facility] to call office back at speak with Ileene Rubens, RN. Awaiting call back.   As far as [facility], I spoke with nurse who is in charge of bariatric surgery and she stated that patient would be required to make multiple in office visits. D/T patients bed bound status and lack of transportation, this would make it difficult. Nurse with [facility] stated that she has not run into this situation before and she wasn't exactly sure what to do.  [facility]: LVM for [place] Newcomer [phone], to return call to office.

## 2020-11-13 NOTE — Telephone Encounter (Signed)
I sent the prescription for oxycodone/acetaminophen 10/325 mg.  He can start taking the prescription the first morning after he gets it filled.  He will need to stop other oxycodone/Xtampza ER pills when he starts the new prescription.  Please have him update me in a few days about his overall pain control.  Thanks.

## 2020-11-13 NOTE — Telephone Encounter (Signed)
Spoke with Melissa with [employer] [facility] who states that unfortunately patient will have to start the process over again and will need to repeat the titration.

## 2020-11-15 DIAGNOSIS — K219 Gastro-esophageal reflux disease without esophagitis: Secondary | ICD-10-CM | POA: Diagnosis not present

## 2020-11-15 DIAGNOSIS — Z9181 History of falling: Secondary | ICD-10-CM | POA: Diagnosis not present

## 2020-11-15 DIAGNOSIS — I10 Essential (primary) hypertension: Secondary | ICD-10-CM | POA: Diagnosis not present

## 2020-11-15 DIAGNOSIS — D649 Anemia, unspecified: Secondary | ICD-10-CM | POA: Diagnosis not present

## 2020-11-15 DIAGNOSIS — M150 Primary generalized (osteo)arthritis: Secondary | ICD-10-CM | POA: Diagnosis not present

## 2020-11-15 DIAGNOSIS — Z87891 Personal history of nicotine dependence: Secondary | ICD-10-CM | POA: Diagnosis not present

## 2020-11-15 DIAGNOSIS — Z6841 Body Mass Index (BMI) 40.0 and over, adult: Secondary | ICD-10-CM | POA: Diagnosis not present

## 2020-11-15 DIAGNOSIS — G4733 Obstructive sleep apnea (adult) (pediatric): Secondary | ICD-10-CM | POA: Diagnosis not present

## 2020-11-15 NOTE — Telephone Encounter (Addendum)
Please let patient know about the situation with [facility].  What we need to do to restart the titration process for CPAP?

## 2020-11-16 DIAGNOSIS — M1631 Unilateral osteoarthritis resulting from hip dysplasia, right hip: Secondary | ICD-10-CM | POA: Diagnosis not present

## 2020-11-16 DIAGNOSIS — R269 Unspecified abnormalities of gait and mobility: Secondary | ICD-10-CM | POA: Diagnosis not present

## 2020-11-16 NOTE — Telephone Encounter (Signed)
[facility] Night - Client Nonclinical Telephone Record  [facility] Client [facility] Night - Client Client Site [facility] - Night Physician Raechel Ache - MD Contact Type Call Who Is Calling Patient / Member / Family / Caregiver Caller Name Zayvian Mcmurtry Caller Phone Number [phone] Call Type Message Only Information Provided Reason for Call Returning a Call from the Office Initial Comment Darlene with [facility] is returning a call from the office. Disp. Time Disposition Final User 11/13/2020 5:02:58 PM General Information Provided Yes Wisdom, Melynda Call Closed By: Rodena Piety Transaction Date/Time: 11/13/2020 5:01:00 PM (ET)

## 2020-11-16 NOTE — Telephone Encounter (Signed)
Pt will need a referral placed for a titration sleep study but not sure how this will be done if pt is bed bound in his home. May need to get EMS to transport him to the sleep center for the study. There is no way to do a titration sleep study at home. Maybe place the referral and let referrals do some research on facilities that actually perform the sleep studies to see what is available.    Contacted Darlene at [phone] and advised the pressure reducing mattress order has been faxed. Also gave verbal orders for lab orders w/diagnosis codes. Inquired if she knew anything about pt getting extra help form an aide. She said she sees in the chart that a Child psychotherapist has been working with the pt and they have signed him up for the CAP program (community alternative program) with Medicaid which would allow someone to come to the house a few hours a day. She believes there is a wait list for this though. The social worker is currently taking care of this. Advised if anything is needed to contact office.  Darlene verbalized understanding.

## 2020-11-17 DIAGNOSIS — G4733 Obstructive sleep apnea (adult) (pediatric): Secondary | ICD-10-CM | POA: Diagnosis not present

## 2020-11-17 DIAGNOSIS — I10 Essential (primary) hypertension: Secondary | ICD-10-CM | POA: Diagnosis not present

## 2020-11-17 DIAGNOSIS — K219 Gastro-esophageal reflux disease without esophagitis: Secondary | ICD-10-CM | POA: Diagnosis not present

## 2020-11-17 DIAGNOSIS — M150 Primary generalized (osteo)arthritis: Secondary | ICD-10-CM | POA: Diagnosis not present

## 2020-11-17 DIAGNOSIS — Z87891 Personal history of nicotine dependence: Secondary | ICD-10-CM | POA: Diagnosis not present

## 2020-11-17 DIAGNOSIS — Z6841 Body Mass Index (BMI) 40.0 and over, adult: Secondary | ICD-10-CM | POA: Diagnosis not present

## 2020-11-17 DIAGNOSIS — Z9181 History of falling: Secondary | ICD-10-CM | POA: Diagnosis not present

## 2020-11-17 DIAGNOSIS — D649 Anemia, unspecified: Secondary | ICD-10-CM | POA: Diagnosis not present

## 2020-11-17 NOTE — Telephone Encounter (Addendum)
Please check with pulmonary and see if they have any ideas about how to manage CPAP/set up a study at home for this patient.  I can refer to pulmonary if needed.  Please let me know what you hear.  Thanks.

## 2020-11-17 NOTE — Telephone Encounter (Signed)
Left message for patient to call back  

## 2020-11-18 DIAGNOSIS — Z6841 Body Mass Index (BMI) 40.0 and over, adult: Secondary | ICD-10-CM | POA: Diagnosis not present

## 2020-11-18 DIAGNOSIS — K219 Gastro-esophageal reflux disease without esophagitis: Secondary | ICD-10-CM | POA: Diagnosis not present

## 2020-11-18 DIAGNOSIS — Z87891 Personal history of nicotine dependence: Secondary | ICD-10-CM | POA: Diagnosis not present

## 2020-11-18 DIAGNOSIS — M150 Primary generalized (osteo)arthritis: Secondary | ICD-10-CM | POA: Diagnosis not present

## 2020-11-18 DIAGNOSIS — D649 Anemia, unspecified: Secondary | ICD-10-CM | POA: Diagnosis not present

## 2020-11-18 DIAGNOSIS — G4733 Obstructive sleep apnea (adult) (pediatric): Secondary | ICD-10-CM | POA: Diagnosis not present

## 2020-11-18 DIAGNOSIS — Z9181 History of falling: Secondary | ICD-10-CM | POA: Diagnosis not present

## 2020-11-18 DIAGNOSIS — I10 Essential (primary) hypertension: Secondary | ICD-10-CM | POA: Diagnosis not present

## 2020-11-19 ENCOUNTER — Telehealth: Payer: Self-pay | Admitting: *Deleted

## 2020-11-19 DIAGNOSIS — Z9181 History of falling: Secondary | ICD-10-CM | POA: Diagnosis not present

## 2020-11-19 DIAGNOSIS — K219 Gastro-esophageal reflux disease without esophagitis: Secondary | ICD-10-CM | POA: Diagnosis not present

## 2020-11-19 DIAGNOSIS — G4733 Obstructive sleep apnea (adult) (pediatric): Secondary | ICD-10-CM | POA: Diagnosis not present

## 2020-11-19 DIAGNOSIS — I10 Essential (primary) hypertension: Secondary | ICD-10-CM | POA: Diagnosis not present

## 2020-11-19 DIAGNOSIS — D649 Anemia, unspecified: Secondary | ICD-10-CM | POA: Diagnosis not present

## 2020-11-19 DIAGNOSIS — Z87891 Personal history of nicotine dependence: Secondary | ICD-10-CM | POA: Diagnosis not present

## 2020-11-19 DIAGNOSIS — M150 Primary generalized (osteo)arthritis: Secondary | ICD-10-CM | POA: Diagnosis not present

## 2020-11-19 DIAGNOSIS — Z6841 Body Mass Index (BMI) 40.0 and over, adult: Secondary | ICD-10-CM | POA: Diagnosis not present

## 2020-11-19 NOTE — Telephone Encounter (Signed)
Anner Crete, RN, at [facility] and reported pt needs titration sleep but is immobile and also would want to refer pt if providers are willing to see pt with a VV. Nedra Hai reports a titration study will need to be done in the facility and they would need to make sure they had equipment that could accomodate the pt. Nedra Hai will f/u with that and also ask the providers if they could see the pt by VV. She will return call tomorrow.

## 2020-11-19 NOTE — Telephone Encounter (Signed)
Carollee Herter, RN called from [facility] and stated that they would like  Refer the pt to our practice and the pt would need titration sleep study and virtual visit only.  Pt is obese and bed bound.    Carollee Herter wanted to see if this would be an accomodation that our office would be able to make for the pt?  VS please advise. Thanks  Will need to call Carollee Herter back at [phone] to let her know if this is able to be done.

## 2020-11-20 NOTE — Addendum Note (Signed)
Addended by: Sherrie George on: 11/20/2020 11:53 AM   Modules accepted: Orders

## 2020-11-20 NOTE — Telephone Encounter (Signed)
LMTCB

## 2020-11-20 NOTE — Telephone Encounter (Signed)
Michael Hai, RN at [facility] reports Dr. Craige Cotta agreed to have VV with pt. She said Dr. Craige Cotta says pt will need to go to sleep lab for titration study and he is not sure how that can be accomplished. There will also need to be research to make sure the sleep lab can accommodate him.  Michael Payne reports to place the referral and add to the referral and that the referral is only for Dr. Craige Cotta and that Dr. Craige Cotta agreed to VV with pt. Michael Payne for her help and advised if anything is needed from this office to call this nurse. Lee verbalized understanding.

## 2020-11-20 NOTE — Telephone Encounter (Signed)
I am fine doing virtual visit.  I am not sure how he will have titration study done if he is bed bound and can't travel.

## 2020-11-20 NOTE — Telephone Encounter (Signed)
I called and spoke with Michael Payne this morning and she is aware of VS recs. She will place the referral over to [place] for the pt to see VS.

## 2020-11-21 NOTE — Telephone Encounter (Signed)
I signed the referral order and I thank all involved.

## 2020-11-21 NOTE — Addendum Note (Signed)
Addended by: Joaquim Nam on: 11/21/2020 11:27 AM   Modules accepted: Orders

## 2020-11-24 DIAGNOSIS — G4733 Obstructive sleep apnea (adult) (pediatric): Secondary | ICD-10-CM | POA: Diagnosis not present

## 2020-11-24 DIAGNOSIS — Z87891 Personal history of nicotine dependence: Secondary | ICD-10-CM | POA: Diagnosis not present

## 2020-11-24 DIAGNOSIS — I10 Essential (primary) hypertension: Secondary | ICD-10-CM | POA: Diagnosis not present

## 2020-11-24 DIAGNOSIS — K219 Gastro-esophageal reflux disease without esophagitis: Secondary | ICD-10-CM | POA: Diagnosis not present

## 2020-11-24 DIAGNOSIS — Z9181 History of falling: Secondary | ICD-10-CM | POA: Diagnosis not present

## 2020-11-24 DIAGNOSIS — D649 Anemia, unspecified: Secondary | ICD-10-CM | POA: Diagnosis not present

## 2020-11-24 DIAGNOSIS — Z6841 Body Mass Index (BMI) 40.0 and over, adult: Secondary | ICD-10-CM | POA: Diagnosis not present

## 2020-11-24 DIAGNOSIS — M150 Primary generalized (osteo)arthritis: Secondary | ICD-10-CM | POA: Diagnosis not present

## 2020-11-25 DIAGNOSIS — I10 Essential (primary) hypertension: Secondary | ICD-10-CM | POA: Diagnosis not present

## 2020-11-25 DIAGNOSIS — M150 Primary generalized (osteo)arthritis: Secondary | ICD-10-CM | POA: Diagnosis not present

## 2020-11-25 DIAGNOSIS — D649 Anemia, unspecified: Secondary | ICD-10-CM | POA: Diagnosis not present

## 2020-11-25 DIAGNOSIS — K219 Gastro-esophageal reflux disease without esophagitis: Secondary | ICD-10-CM | POA: Diagnosis not present

## 2020-11-25 DIAGNOSIS — Z87891 Personal history of nicotine dependence: Secondary | ICD-10-CM | POA: Diagnosis not present

## 2020-11-25 DIAGNOSIS — Z6841 Body Mass Index (BMI) 40.0 and over, adult: Secondary | ICD-10-CM | POA: Diagnosis not present

## 2020-11-25 DIAGNOSIS — Z9181 History of falling: Secondary | ICD-10-CM | POA: Diagnosis not present

## 2020-11-25 DIAGNOSIS — G4733 Obstructive sleep apnea (adult) (pediatric): Secondary | ICD-10-CM | POA: Diagnosis not present

## 2020-11-26 DIAGNOSIS — G4733 Obstructive sleep apnea (adult) (pediatric): Secondary | ICD-10-CM | POA: Diagnosis not present

## 2020-12-01 ENCOUNTER — Telehealth: Payer: Self-pay | Admitting: *Deleted

## 2020-12-01 DIAGNOSIS — G4733 Obstructive sleep apnea (adult) (pediatric): Secondary | ICD-10-CM | POA: Diagnosis not present

## 2020-12-01 DIAGNOSIS — M150 Primary generalized (osteo)arthritis: Secondary | ICD-10-CM | POA: Diagnosis not present

## 2020-12-01 DIAGNOSIS — Z87891 Personal history of nicotine dependence: Secondary | ICD-10-CM | POA: Diagnosis not present

## 2020-12-01 DIAGNOSIS — I10 Essential (primary) hypertension: Secondary | ICD-10-CM | POA: Diagnosis not present

## 2020-12-01 DIAGNOSIS — Z9181 History of falling: Secondary | ICD-10-CM | POA: Diagnosis not present

## 2020-12-01 DIAGNOSIS — Z6841 Body Mass Index (BMI) 40.0 and over, adult: Secondary | ICD-10-CM | POA: Diagnosis not present

## 2020-12-01 DIAGNOSIS — D649 Anemia, unspecified: Secondary | ICD-10-CM | POA: Diagnosis not present

## 2020-12-01 DIAGNOSIS — K219 Gastro-esophageal reflux disease without esophagitis: Secondary | ICD-10-CM | POA: Diagnosis not present

## 2020-12-01 NOTE — Telephone Encounter (Signed)
Kreg Shropshire PT with [facility] left a voicemail stating that she would like verbal orders for PT home health for patient for once a week for nine weeks.

## 2020-12-01 NOTE — Telephone Encounter (Signed)
Please give the order.  Thanks.   

## 2020-12-01 NOTE — Telephone Encounter (Signed)
Verbal order given to [facility] by telephone per Dr. Para March.

## 2020-12-03 DIAGNOSIS — Z87891 Personal history of nicotine dependence: Secondary | ICD-10-CM | POA: Diagnosis not present

## 2020-12-03 DIAGNOSIS — D649 Anemia, unspecified: Secondary | ICD-10-CM | POA: Diagnosis not present

## 2020-12-03 DIAGNOSIS — I10 Essential (primary) hypertension: Secondary | ICD-10-CM | POA: Diagnosis not present

## 2020-12-03 DIAGNOSIS — K219 Gastro-esophageal reflux disease without esophagitis: Secondary | ICD-10-CM | POA: Diagnosis not present

## 2020-12-03 DIAGNOSIS — M150 Primary generalized (osteo)arthritis: Secondary | ICD-10-CM | POA: Diagnosis not present

## 2020-12-03 DIAGNOSIS — Z9181 History of falling: Secondary | ICD-10-CM | POA: Diagnosis not present

## 2020-12-03 DIAGNOSIS — Z6841 Body Mass Index (BMI) 40.0 and over, adult: Secondary | ICD-10-CM | POA: Diagnosis not present

## 2020-12-03 DIAGNOSIS — G4733 Obstructive sleep apnea (adult) (pediatric): Secondary | ICD-10-CM | POA: Diagnosis not present

## 2020-12-06 DIAGNOSIS — Z6841 Body Mass Index (BMI) 40.0 and over, adult: Secondary | ICD-10-CM | POA: Diagnosis not present

## 2020-12-06 DIAGNOSIS — M150 Primary generalized (osteo)arthritis: Secondary | ICD-10-CM | POA: Diagnosis not present

## 2020-12-06 DIAGNOSIS — D649 Anemia, unspecified: Secondary | ICD-10-CM | POA: Diagnosis not present

## 2020-12-06 DIAGNOSIS — I10 Essential (primary) hypertension: Secondary | ICD-10-CM | POA: Diagnosis not present

## 2020-12-06 DIAGNOSIS — Z87891 Personal history of nicotine dependence: Secondary | ICD-10-CM | POA: Diagnosis not present

## 2020-12-06 DIAGNOSIS — G4733 Obstructive sleep apnea (adult) (pediatric): Secondary | ICD-10-CM | POA: Diagnosis not present

## 2020-12-06 DIAGNOSIS — Z9181 History of falling: Secondary | ICD-10-CM | POA: Diagnosis not present

## 2020-12-06 DIAGNOSIS — K219 Gastro-esophageal reflux disease without esophagitis: Secondary | ICD-10-CM | POA: Diagnosis not present

## 2020-12-07 DIAGNOSIS — M1611 Unilateral primary osteoarthritis, right hip: Secondary | ICD-10-CM | POA: Diagnosis not present

## 2020-12-09 DIAGNOSIS — M6281 Muscle weakness (generalized): Secondary | ICD-10-CM | POA: Diagnosis not present

## 2020-12-10 ENCOUNTER — Telehealth: Payer: Self-pay | Admitting: Family Medicine

## 2020-12-10 DIAGNOSIS — Z87891 Personal history of nicotine dependence: Secondary | ICD-10-CM | POA: Diagnosis not present

## 2020-12-10 DIAGNOSIS — Z6841 Body Mass Index (BMI) 40.0 and over, adult: Secondary | ICD-10-CM | POA: Diagnosis not present

## 2020-12-10 DIAGNOSIS — G4733 Obstructive sleep apnea (adult) (pediatric): Secondary | ICD-10-CM | POA: Diagnosis not present

## 2020-12-10 DIAGNOSIS — K219 Gastro-esophageal reflux disease without esophagitis: Secondary | ICD-10-CM | POA: Diagnosis not present

## 2020-12-10 DIAGNOSIS — M1611 Unilateral primary osteoarthritis, right hip: Secondary | ICD-10-CM

## 2020-12-10 DIAGNOSIS — I10 Essential (primary) hypertension: Secondary | ICD-10-CM | POA: Diagnosis not present

## 2020-12-10 DIAGNOSIS — M25559 Pain in unspecified hip: Secondary | ICD-10-CM

## 2020-12-10 DIAGNOSIS — M150 Primary generalized (osteo)arthritis: Secondary | ICD-10-CM | POA: Diagnosis not present

## 2020-12-10 DIAGNOSIS — Z9181 History of falling: Secondary | ICD-10-CM | POA: Diagnosis not present

## 2020-12-10 DIAGNOSIS — D649 Anemia, unspecified: Secondary | ICD-10-CM | POA: Diagnosis not present

## 2020-12-10 NOTE — Telephone Encounter (Signed)
Mr Michael Payne called in wanted to know if he can get a referral to another orthopedic doctor and doesn't have one in mind.   Please advise

## 2020-12-10 NOTE — Telephone Encounter (Signed)
  LAST APPOINTMENT DATE: 12/01/2020   NEXT APPOINTMENT DATE:@7 /03/2021  MEDICATION: oxycodone 10mg    [facility] [address]   Let patient know to contact pharmacy at the end of the day to make sure medication is ready.  Please notify patient to allow 48-72 hours to process  Encourage patient to contact the pharmacy for refills or they can request refills through [facility]  CLINICAL FILLS OUT ALL BELOW:   LAST REFILL:  QTY:  REFILL DATE:    OTHER COMMENTS:    Okay for refill?  Please advise

## 2020-12-10 NOTE — Telephone Encounter (Signed)
Last refilled on 11/13/20 #120/1

## 2020-12-10 NOTE — Telephone Encounter (Signed)
The referral is for his hip

## 2020-12-11 ENCOUNTER — Other Ambulatory Visit: Payer: Self-pay | Admitting: *Deleted

## 2020-12-11 DIAGNOSIS — M62838 Other muscle spasm: Secondary | ICD-10-CM

## 2020-12-11 DIAGNOSIS — M1611 Unilateral primary osteoarthritis, right hip: Secondary | ICD-10-CM

## 2020-12-11 MED ORDER — OXYCODONE-ACETAMINOPHEN 10-325 MG PO TABS: 1.0000 | ORAL_TABLET | Freq: Four times a day (QID) | ORAL | 0 refills | Status: DC | PRN

## 2020-12-11 NOTE — Telephone Encounter (Signed)
Patient notified referral was done and will get call from ortho to schedule an appt.

## 2020-12-11 NOTE — Telephone Encounter (Signed)
Last office visit 11/05/2020 for TOC.  Last refilled 09/02/2020 for #90 with 3 refills.  No future appointments with PCP.

## 2020-12-11 NOTE — Telephone Encounter (Signed)
I put in the referral.  Thanks.  

## 2020-12-11 NOTE — Telephone Encounter (Signed)
Patient notified rx was sent 

## 2020-12-11 NOTE — Telephone Encounter (Signed)
Sent. Thanks.   

## 2020-12-13 MED ORDER — TIZANIDINE HCL 4 MG PO TABS
4.0000 mg | ORAL_TABLET | Freq: Three times a day (TID) | ORAL | 3 refills | Status: DC | PRN
Start: 2020-12-13 — End: 2021-06-28

## 2020-12-13 NOTE — Telephone Encounter (Signed)
Sent. Thanks.   

## 2020-12-15 DIAGNOSIS — G4733 Obstructive sleep apnea (adult) (pediatric): Secondary | ICD-10-CM | POA: Diagnosis not present

## 2020-12-15 DIAGNOSIS — Z87891 Personal history of nicotine dependence: Secondary | ICD-10-CM | POA: Diagnosis not present

## 2020-12-15 DIAGNOSIS — Z6841 Body Mass Index (BMI) 40.0 and over, adult: Secondary | ICD-10-CM | POA: Diagnosis not present

## 2020-12-15 DIAGNOSIS — Z9181 History of falling: Secondary | ICD-10-CM | POA: Diagnosis not present

## 2020-12-15 DIAGNOSIS — D649 Anemia, unspecified: Secondary | ICD-10-CM | POA: Diagnosis not present

## 2020-12-15 DIAGNOSIS — K219 Gastro-esophageal reflux disease without esophagitis: Secondary | ICD-10-CM | POA: Diagnosis not present

## 2020-12-15 DIAGNOSIS — M150 Primary generalized (osteo)arthritis: Secondary | ICD-10-CM | POA: Diagnosis not present

## 2020-12-15 DIAGNOSIS — I10 Essential (primary) hypertension: Secondary | ICD-10-CM | POA: Diagnosis not present

## 2020-12-16 DIAGNOSIS — M1631 Unilateral osteoarthritis resulting from hip dysplasia, right hip: Secondary | ICD-10-CM | POA: Diagnosis not present

## 2020-12-16 DIAGNOSIS — R269 Unspecified abnormalities of gait and mobility: Secondary | ICD-10-CM | POA: Diagnosis not present

## 2020-12-18 DIAGNOSIS — Z9181 History of falling: Secondary | ICD-10-CM | POA: Diagnosis not present

## 2020-12-18 DIAGNOSIS — I10 Essential (primary) hypertension: Secondary | ICD-10-CM | POA: Diagnosis not present

## 2020-12-18 DIAGNOSIS — D649 Anemia, unspecified: Secondary | ICD-10-CM | POA: Diagnosis not present

## 2020-12-18 DIAGNOSIS — M150 Primary generalized (osteo)arthritis: Secondary | ICD-10-CM | POA: Diagnosis not present

## 2020-12-18 DIAGNOSIS — Z6841 Body Mass Index (BMI) 40.0 and over, adult: Secondary | ICD-10-CM | POA: Diagnosis not present

## 2020-12-18 DIAGNOSIS — G4733 Obstructive sleep apnea (adult) (pediatric): Secondary | ICD-10-CM | POA: Diagnosis not present

## 2020-12-18 DIAGNOSIS — Z87891 Personal history of nicotine dependence: Secondary | ICD-10-CM | POA: Diagnosis not present

## 2020-12-18 DIAGNOSIS — K219 Gastro-esophageal reflux disease without esophagitis: Secondary | ICD-10-CM | POA: Diagnosis not present

## 2020-12-22 DIAGNOSIS — M150 Primary generalized (osteo)arthritis: Secondary | ICD-10-CM | POA: Diagnosis not present

## 2020-12-22 DIAGNOSIS — G4733 Obstructive sleep apnea (adult) (pediatric): Secondary | ICD-10-CM | POA: Diagnosis not present

## 2020-12-22 DIAGNOSIS — K219 Gastro-esophageal reflux disease without esophagitis: Secondary | ICD-10-CM | POA: Diagnosis not present

## 2020-12-22 DIAGNOSIS — Z6841 Body Mass Index (BMI) 40.0 and over, adult: Secondary | ICD-10-CM | POA: Diagnosis not present

## 2020-12-22 DIAGNOSIS — D649 Anemia, unspecified: Secondary | ICD-10-CM | POA: Diagnosis not present

## 2020-12-22 DIAGNOSIS — Z87891 Personal history of nicotine dependence: Secondary | ICD-10-CM | POA: Diagnosis not present

## 2020-12-22 DIAGNOSIS — Z9181 History of falling: Secondary | ICD-10-CM | POA: Diagnosis not present

## 2020-12-22 DIAGNOSIS — I10 Essential (primary) hypertension: Secondary | ICD-10-CM | POA: Diagnosis not present

## 2020-12-24 DIAGNOSIS — I10 Essential (primary) hypertension: Secondary | ICD-10-CM | POA: Diagnosis not present

## 2020-12-24 DIAGNOSIS — G4733 Obstructive sleep apnea (adult) (pediatric): Secondary | ICD-10-CM | POA: Diagnosis not present

## 2020-12-24 DIAGNOSIS — M150 Primary generalized (osteo)arthritis: Secondary | ICD-10-CM | POA: Diagnosis not present

## 2020-12-24 DIAGNOSIS — Z6841 Body Mass Index (BMI) 40.0 and over, adult: Secondary | ICD-10-CM | POA: Diagnosis not present

## 2020-12-24 DIAGNOSIS — D649 Anemia, unspecified: Secondary | ICD-10-CM | POA: Diagnosis not present

## 2020-12-24 DIAGNOSIS — K219 Gastro-esophageal reflux disease without esophagitis: Secondary | ICD-10-CM | POA: Diagnosis not present

## 2020-12-24 DIAGNOSIS — Z87891 Personal history of nicotine dependence: Secondary | ICD-10-CM | POA: Diagnosis not present

## 2020-12-24 DIAGNOSIS — Z9181 History of falling: Secondary | ICD-10-CM | POA: Diagnosis not present

## 2020-12-30 DIAGNOSIS — Z9181 History of falling: Secondary | ICD-10-CM | POA: Diagnosis not present

## 2020-12-30 DIAGNOSIS — Z87891 Personal history of nicotine dependence: Secondary | ICD-10-CM | POA: Diagnosis not present

## 2020-12-30 DIAGNOSIS — D649 Anemia, unspecified: Secondary | ICD-10-CM | POA: Diagnosis not present

## 2020-12-30 DIAGNOSIS — Z6841 Body Mass Index (BMI) 40.0 and over, adult: Secondary | ICD-10-CM | POA: Diagnosis not present

## 2020-12-30 DIAGNOSIS — G4733 Obstructive sleep apnea (adult) (pediatric): Secondary | ICD-10-CM | POA: Diagnosis not present

## 2020-12-30 DIAGNOSIS — K219 Gastro-esophageal reflux disease without esophagitis: Secondary | ICD-10-CM | POA: Diagnosis not present

## 2020-12-30 DIAGNOSIS — I10 Essential (primary) hypertension: Secondary | ICD-10-CM | POA: Diagnosis not present

## 2020-12-30 DIAGNOSIS — M150 Primary generalized (osteo)arthritis: Secondary | ICD-10-CM | POA: Diagnosis not present

## 2020-12-31 DIAGNOSIS — D649 Anemia, unspecified: Secondary | ICD-10-CM | POA: Diagnosis not present

## 2020-12-31 DIAGNOSIS — M150 Primary generalized (osteo)arthritis: Secondary | ICD-10-CM | POA: Diagnosis not present

## 2020-12-31 DIAGNOSIS — K219 Gastro-esophageal reflux disease without esophagitis: Secondary | ICD-10-CM | POA: Diagnosis not present

## 2020-12-31 DIAGNOSIS — Z9181 History of falling: Secondary | ICD-10-CM | POA: Diagnosis not present

## 2020-12-31 DIAGNOSIS — Z6841 Body Mass Index (BMI) 40.0 and over, adult: Secondary | ICD-10-CM | POA: Diagnosis not present

## 2020-12-31 DIAGNOSIS — I10 Essential (primary) hypertension: Secondary | ICD-10-CM | POA: Diagnosis not present

## 2020-12-31 DIAGNOSIS — G4733 Obstructive sleep apnea (adult) (pediatric): Secondary | ICD-10-CM | POA: Diagnosis not present

## 2020-12-31 DIAGNOSIS — Z87891 Personal history of nicotine dependence: Secondary | ICD-10-CM | POA: Diagnosis not present

## 2021-01-04 ENCOUNTER — Other Ambulatory Visit: Payer: Self-pay

## 2021-01-04 DIAGNOSIS — M1611 Unilateral primary osteoarthritis, right hip: Secondary | ICD-10-CM

## 2021-01-04 MED ORDER — OXYCODONE-ACETAMINOPHEN 10-325 MG PO TABS: 1.0000 | ORAL_TABLET | Freq: Four times a day (QID) | ORAL | 0 refills | Status: DC | PRN

## 2021-01-04 NOTE — Telephone Encounter (Signed)
Name of Medication: Percocet 10/325  Name of Pharmacy: [facility] [address] Last Fill or Written Date and Quantity: 12/11/20 #120 Rf 0 Last Office Visit and Type: VV 11/05/20-follow up Next Office Visit and Type: 03/25/21-Medicare wellness nurse visit Last Controlled Substance Agreement Date: do not see one in the chart Last UDS: 01/09/2018  Patient is bed ridden. Patient states he is not due till 01/11/21-patient is aware Dr Para March is not in the office this week and will see and address this next week.

## 2021-01-05 DIAGNOSIS — I10 Essential (primary) hypertension: Secondary | ICD-10-CM | POA: Diagnosis not present

## 2021-01-05 DIAGNOSIS — M150 Primary generalized (osteo)arthritis: Secondary | ICD-10-CM | POA: Diagnosis not present

## 2021-01-05 DIAGNOSIS — K219 Gastro-esophageal reflux disease without esophagitis: Secondary | ICD-10-CM | POA: Diagnosis not present

## 2021-01-05 DIAGNOSIS — D649 Anemia, unspecified: Secondary | ICD-10-CM | POA: Diagnosis not present

## 2021-01-05 DIAGNOSIS — G4733 Obstructive sleep apnea (adult) (pediatric): Secondary | ICD-10-CM | POA: Diagnosis not present

## 2021-01-05 DIAGNOSIS — Z87891 Personal history of nicotine dependence: Secondary | ICD-10-CM | POA: Diagnosis not present

## 2021-01-05 DIAGNOSIS — Z6841 Body Mass Index (BMI) 40.0 and over, adult: Secondary | ICD-10-CM | POA: Diagnosis not present

## 2021-01-05 DIAGNOSIS — Z9181 History of falling: Secondary | ICD-10-CM | POA: Diagnosis not present

## 2021-01-06 ENCOUNTER — Other Ambulatory Visit: Payer: Self-pay | Admitting: Family Medicine

## 2021-01-06 DIAGNOSIS — Z87891 Personal history of nicotine dependence: Secondary | ICD-10-CM | POA: Diagnosis not present

## 2021-01-06 DIAGNOSIS — M1611 Unilateral primary osteoarthritis, right hip: Secondary | ICD-10-CM

## 2021-01-06 DIAGNOSIS — M150 Primary generalized (osteo)arthritis: Secondary | ICD-10-CM | POA: Diagnosis not present

## 2021-01-06 DIAGNOSIS — D649 Anemia, unspecified: Secondary | ICD-10-CM | POA: Diagnosis not present

## 2021-01-06 DIAGNOSIS — Z9181 History of falling: Secondary | ICD-10-CM | POA: Diagnosis not present

## 2021-01-06 DIAGNOSIS — K219 Gastro-esophageal reflux disease without esophagitis: Secondary | ICD-10-CM | POA: Diagnosis not present

## 2021-01-06 DIAGNOSIS — I10 Essential (primary) hypertension: Secondary | ICD-10-CM | POA: Diagnosis not present

## 2021-01-06 DIAGNOSIS — G4733 Obstructive sleep apnea (adult) (pediatric): Secondary | ICD-10-CM | POA: Diagnosis not present

## 2021-01-06 DIAGNOSIS — Z6841 Body Mass Index (BMI) 40.0 and over, adult: Secondary | ICD-10-CM | POA: Diagnosis not present

## 2021-01-07 DIAGNOSIS — K219 Gastro-esophageal reflux disease without esophagitis: Secondary | ICD-10-CM | POA: Diagnosis not present

## 2021-01-07 DIAGNOSIS — Z6841 Body Mass Index (BMI) 40.0 and over, adult: Secondary | ICD-10-CM | POA: Diagnosis not present

## 2021-01-07 DIAGNOSIS — Z87891 Personal history of nicotine dependence: Secondary | ICD-10-CM | POA: Diagnosis not present

## 2021-01-07 DIAGNOSIS — G4733 Obstructive sleep apnea (adult) (pediatric): Secondary | ICD-10-CM | POA: Diagnosis not present

## 2021-01-07 DIAGNOSIS — M1611 Unilateral primary osteoarthritis, right hip: Secondary | ICD-10-CM | POA: Diagnosis not present

## 2021-01-07 DIAGNOSIS — M150 Primary generalized (osteo)arthritis: Secondary | ICD-10-CM | POA: Diagnosis not present

## 2021-01-07 DIAGNOSIS — D649 Anemia, unspecified: Secondary | ICD-10-CM | POA: Diagnosis not present

## 2021-01-07 DIAGNOSIS — Z9181 History of falling: Secondary | ICD-10-CM | POA: Diagnosis not present

## 2021-01-07 DIAGNOSIS — I10 Essential (primary) hypertension: Secondary | ICD-10-CM | POA: Diagnosis not present

## 2021-01-09 DIAGNOSIS — M6281 Muscle weakness (generalized): Secondary | ICD-10-CM | POA: Diagnosis not present

## 2021-01-11 ENCOUNTER — Telehealth: Payer: Self-pay | Admitting: Family Medicine

## 2021-01-11 NOTE — Telephone Encounter (Signed)
Spoke to patient by telephone and was advised that he had requested a refill on his Oxycodone. Patient was advised that this was sent in to the pharmacy on 01/04/21. Patient stated that he has talked with his pharmacy and they are getting the refill ready for him now because it was not due for him to get until today. Patient stated that this has already been taken care of.

## 2021-01-11 NOTE — Telephone Encounter (Signed)
  LAST APPOINTMENT DATE: 01/06/2021   NEXT APPOINTMENT DATE:@7 /03/2021  MEDICATION: hydrocodone   [facility] [address]  Let patient know to contact pharmacy at the end of the day to make sure medication is ready.  Please notify patient to allow 48-72 hours to process  Encourage patient to contact the pharmacy for refills or they can request refills through [facility]  CLINICAL FILLS OUT ALL BELOW:   LAST REFILL:  QTY:  REFILL DATE:    OTHER COMMENTS:    Okay for refill?  Please advise

## 2021-01-12 DIAGNOSIS — Z9181 History of falling: Secondary | ICD-10-CM | POA: Diagnosis not present

## 2021-01-12 DIAGNOSIS — I10 Essential (primary) hypertension: Secondary | ICD-10-CM | POA: Diagnosis not present

## 2021-01-12 DIAGNOSIS — G4733 Obstructive sleep apnea (adult) (pediatric): Secondary | ICD-10-CM | POA: Diagnosis not present

## 2021-01-12 DIAGNOSIS — Z6841 Body Mass Index (BMI) 40.0 and over, adult: Secondary | ICD-10-CM | POA: Diagnosis not present

## 2021-01-12 DIAGNOSIS — K219 Gastro-esophageal reflux disease without esophagitis: Secondary | ICD-10-CM | POA: Diagnosis not present

## 2021-01-12 DIAGNOSIS — M150 Primary generalized (osteo)arthritis: Secondary | ICD-10-CM | POA: Diagnosis not present

## 2021-01-12 DIAGNOSIS — D649 Anemia, unspecified: Secondary | ICD-10-CM | POA: Diagnosis not present

## 2021-01-12 DIAGNOSIS — Z87891 Personal history of nicotine dependence: Secondary | ICD-10-CM | POA: Diagnosis not present

## 2021-01-13 ENCOUNTER — Other Ambulatory Visit: Payer: Self-pay | Admitting: Family Medicine

## 2021-01-13 DIAGNOSIS — Z7401 Bed confinement status: Secondary | ICD-10-CM

## 2021-01-14 ENCOUNTER — Telehealth: Payer: Self-pay | Admitting: *Deleted

## 2021-01-14 DIAGNOSIS — M150 Primary generalized (osteo)arthritis: Secondary | ICD-10-CM | POA: Diagnosis not present

## 2021-01-14 DIAGNOSIS — Z6841 Body Mass Index (BMI) 40.0 and over, adult: Secondary | ICD-10-CM | POA: Diagnosis not present

## 2021-01-14 DIAGNOSIS — K219 Gastro-esophageal reflux disease without esophagitis: Secondary | ICD-10-CM | POA: Diagnosis not present

## 2021-01-14 DIAGNOSIS — I10 Essential (primary) hypertension: Secondary | ICD-10-CM | POA: Diagnosis not present

## 2021-01-14 DIAGNOSIS — G4733 Obstructive sleep apnea (adult) (pediatric): Secondary | ICD-10-CM | POA: Diagnosis not present

## 2021-01-14 DIAGNOSIS — Z9181 History of falling: Secondary | ICD-10-CM | POA: Diagnosis not present

## 2021-01-14 DIAGNOSIS — D649 Anemia, unspecified: Secondary | ICD-10-CM | POA: Diagnosis not present

## 2021-01-14 DIAGNOSIS — Z87891 Personal history of nicotine dependence: Secondary | ICD-10-CM | POA: Diagnosis not present

## 2021-01-14 NOTE — Addendum Note (Signed)
Addended by: Consuella Lose on: 01/14/2021 08:30 AM   Modules accepted: Orders

## 2021-01-14 NOTE — Chronic Care Management (AMB) (Signed)
  Chronic Care Management   Note  01/14/2021 Name: Michael Payne MRN: [id number] DOB: 1970-12-24  Michael Payne is a 50 y.o. year old male who is a primary care patient of [name], MD. I reached out to [employer] by phone today in response to a referral sent by [name]'s PCP, [name], MD.     [name] was given information about Chronic Care Management services today including:  1. CCM service includes personalized support from designated clinical staff supervised by his physician, including individualized plan of care and coordination with other care providers 2. 24/7 contact phone numbers for assistance for urgent and routine care needs. 3. Service will only be billed when office clinical staff spend 20 minutes or more in a month to coordinate care. 4. Only one practitioner may furnish and bill the service in a calendar month. 5. The patient may stop CCM services at any time (effective at the end of the month) by phone call to the office staff. 6. The patient will be responsible for cost sharing (co-pay) of up to 20% of the service fee (after annual deductible is met).  Patient agreed to services and verbal consent obtained.   Follow up plan: Telephone appointment with care management team member scheduled for:  [facility] 0/10/1115 Licensed Clinical SW 01/25/2021  [place] [facility]  Direct Dial: [phone]

## 2021-01-15 ENCOUNTER — Telehealth: Payer: Self-pay | Admitting: *Deleted

## 2021-01-15 NOTE — Telephone Encounter (Signed)
CSW received request to assist with transportation for pt.  CSW made contact with pt and confirmed pt identity. CSW confirmed pt's identity and introduced self, role and reason for call.  Pt reports he is bedbound; 6'4", 490lbs and unable to get out of the home without the firefighters helping EMS get him out of bed and will need stretcher transport to get down the steps. Per pt, he is actively working with HHPT  ([facility]) and has just this week been able to sit on edge of bed; but not stand up or pivot, etc as of yet.  Pt has [employer] Medicare and Medicaid; both of which he states do not cover ambulance/stretcher transport as a benefit.  CSW has assured pt we will investigate options and resources and plan to follow up next week with him-   Reece Levy MSW, LCSW Licensed Clinical Social Worker [facility] [phone]

## 2021-01-16 DIAGNOSIS — R269 Unspecified abnormalities of gait and mobility: Secondary | ICD-10-CM | POA: Diagnosis not present

## 2021-01-16 DIAGNOSIS — M1631 Unilateral osteoarthritis resulting from hip dysplasia, right hip: Secondary | ICD-10-CM | POA: Diagnosis not present

## 2021-01-18 ENCOUNTER — Telehealth: Payer: Medicare HMO

## 2021-01-18 ENCOUNTER — Telehealth: Payer: Self-pay

## 2021-01-18 ENCOUNTER — Other Ambulatory Visit: Payer: Self-pay

## 2021-01-18 NOTE — Telephone Encounter (Signed)
  Chronic Care Management   Outreach Note  01/18/2021 Name: Michael Payne MRN: [id number] DOB: Aug 02, 1971  Referred by: Joaquim Nam, MD Reason for referral : Chronic Care Management (Initial telephone outreach)   An unsuccessful telephone outreach was attempted today. HIPAA compliant voice message left with call back phone number.  The patient was referred to the case management team for assistance with care management and care coordination.   Follow Up Plan: The care management team will reach out to the patient again over the next 2 weeks days.   George Ina RN,BSN,CCM RN Case Manager [facility] [place]  [phone]

## 2021-01-19 DIAGNOSIS — D649 Anemia, unspecified: Secondary | ICD-10-CM | POA: Diagnosis not present

## 2021-01-19 DIAGNOSIS — M150 Primary generalized (osteo)arthritis: Secondary | ICD-10-CM | POA: Diagnosis not present

## 2021-01-19 DIAGNOSIS — G4733 Obstructive sleep apnea (adult) (pediatric): Secondary | ICD-10-CM | POA: Diagnosis not present

## 2021-01-19 DIAGNOSIS — Z9181 History of falling: Secondary | ICD-10-CM | POA: Diagnosis not present

## 2021-01-19 DIAGNOSIS — Z87891 Personal history of nicotine dependence: Secondary | ICD-10-CM | POA: Diagnosis not present

## 2021-01-19 DIAGNOSIS — I10 Essential (primary) hypertension: Secondary | ICD-10-CM | POA: Diagnosis not present

## 2021-01-19 DIAGNOSIS — K219 Gastro-esophageal reflux disease without esophagitis: Secondary | ICD-10-CM | POA: Diagnosis not present

## 2021-01-19 DIAGNOSIS — Z6841 Body Mass Index (BMI) 40.0 and over, adult: Secondary | ICD-10-CM | POA: Diagnosis not present

## 2021-01-20 ENCOUNTER — Telehealth: Payer: Self-pay | Admitting: Family Medicine

## 2021-01-20 NOTE — Telephone Encounter (Signed)
Note from Buck Mam LSCW-  Dr Para March, I spoke with [facility] rep and they have indicated Prior Auth request for "stretcher" transport to PCP appointment has to be requested by Physician's office- The number to call is [phone]. It may help to make him an appointment (tentative) so you can have a date/time to give them to authorize. I hope this helps and I hope this will get it approved! Mr Franek told me is getting HHPT and perhaps he will make some progress with them and be able to transfer to wheelchair level and then be able to wheelchair Zenaida Niece transportation which should be easier to get arranged with [facility] and/or Medicaid. I plan to follow up with pt next week but wanted to give you and your office staff this info asap.

## 2021-01-20 NOTE — Telephone Encounter (Signed)
See below.  Please see if you can get this approved and schedule him here in the office if needed/possible.  Thanks.

## 2021-01-21 DIAGNOSIS — Z87891 Personal history of nicotine dependence: Secondary | ICD-10-CM | POA: Diagnosis not present

## 2021-01-21 DIAGNOSIS — D649 Anemia, unspecified: Secondary | ICD-10-CM | POA: Diagnosis not present

## 2021-01-21 DIAGNOSIS — K219 Gastro-esophageal reflux disease without esophagitis: Secondary | ICD-10-CM | POA: Diagnosis not present

## 2021-01-21 DIAGNOSIS — G4733 Obstructive sleep apnea (adult) (pediatric): Secondary | ICD-10-CM | POA: Diagnosis not present

## 2021-01-21 DIAGNOSIS — Z9181 History of falling: Secondary | ICD-10-CM | POA: Diagnosis not present

## 2021-01-21 DIAGNOSIS — I10 Essential (primary) hypertension: Secondary | ICD-10-CM | POA: Diagnosis not present

## 2021-01-21 DIAGNOSIS — Z6841 Body Mass Index (BMI) 40.0 and over, adult: Secondary | ICD-10-CM | POA: Diagnosis not present

## 2021-01-21 DIAGNOSIS — M150 Primary generalized (osteo)arthritis: Secondary | ICD-10-CM | POA: Diagnosis not present

## 2021-01-22 ENCOUNTER — Telehealth: Payer: Self-pay | Admitting: *Deleted

## 2021-01-22 NOTE — Telephone Encounter (Signed)
FYI

## 2021-01-22 NOTE — Telephone Encounter (Signed)
Please ask for 10 AM on 01/27/21, phone call.  Thanks.

## 2021-01-22 NOTE — Telephone Encounter (Signed)
Spoke with [facility] Rep who stated that since this is non-emergent transport, [employer] would be the one arranging this and their number is [phone]. Per [facility] Rep, this doesn't not require a PA and if needed, [employer] would complete this.

## 2021-01-22 NOTE — Telephone Encounter (Signed)
That isn't going to be possible. I offered to do this when clinic time/patient care allowed. I don't know why they set short and arbitrary deadlines, but it is a barrier to patient care.  Can the referral start over with re-referral?   Please let patient know that if this is denied, it is due to failure from [facility].

## 2021-01-22 NOTE — Telephone Encounter (Signed)
Spoke with Michael Payne at [facility] who stated that the peer to peer has to be completed by Monday 5/9 by 3 pm. Please advise.

## 2021-01-22 NOTE — Telephone Encounter (Signed)
See below . Thanks

## 2021-01-22 NOTE — Telephone Encounter (Signed)
John with [facility] left message at Triage. Pt's home health services were denied by insurance. In order to get them approved PCP will have to do a peer to peer and speak to one of their providers. If PCP wants to proceed with peer to peer review then our office needs to call by 12pm on Monday 01/25/21 and schedule a time/date for PCP to speak with one of their providers. If they do not here anything from our office by that time they will officially deny his home health services.  CB# [phone] opt 1 to schedule

## 2021-01-22 NOTE — Telephone Encounter (Signed)
Tried to call back but no answer and was put in que waitline. Tried to leave VM but box was full.

## 2021-01-25 ENCOUNTER — Ambulatory Visit (INDEPENDENT_AMBULATORY_CARE_PROVIDER_SITE_OTHER): Payer: Medicare HMO | Admitting: *Deleted

## 2021-01-25 DIAGNOSIS — M1611 Unilateral primary osteoarthritis, right hip: Secondary | ICD-10-CM

## 2021-01-25 DIAGNOSIS — K219 Gastro-esophageal reflux disease without esophagitis: Secondary | ICD-10-CM | POA: Diagnosis not present

## 2021-01-25 DIAGNOSIS — Z6841 Body Mass Index (BMI) 40.0 and over, adult: Secondary | ICD-10-CM | POA: Diagnosis not present

## 2021-01-25 DIAGNOSIS — Z9181 History of falling: Secondary | ICD-10-CM | POA: Diagnosis not present

## 2021-01-25 DIAGNOSIS — D649 Anemia, unspecified: Secondary | ICD-10-CM | POA: Diagnosis not present

## 2021-01-25 DIAGNOSIS — Z7401 Bed confinement status: Secondary | ICD-10-CM

## 2021-01-25 DIAGNOSIS — R262 Difficulty in walking, not elsewhere classified: Secondary | ICD-10-CM

## 2021-01-25 DIAGNOSIS — Z87891 Personal history of nicotine dependence: Secondary | ICD-10-CM | POA: Diagnosis not present

## 2021-01-25 DIAGNOSIS — I10 Essential (primary) hypertension: Secondary | ICD-10-CM | POA: Diagnosis not present

## 2021-01-25 DIAGNOSIS — M150 Primary generalized (osteo)arthritis: Secondary | ICD-10-CM | POA: Diagnosis not present

## 2021-01-25 DIAGNOSIS — G8929 Other chronic pain: Secondary | ICD-10-CM

## 2021-01-25 DIAGNOSIS — G4733 Obstructive sleep apnea (adult) (pediatric): Secondary | ICD-10-CM | POA: Diagnosis not present

## 2021-01-25 NOTE — Chronic Care Management (AMB) (Signed)
Chronic Care Management    Clinical Social Work Note  01/25/2021 Name: [name] MRN: [id number] DOB: [birth year]  Michael Payne is a 50 y.o. year old male who is a primary care patient of Joaquim Nam, MD. The CCM team was consulted to assist the patient with chronic disease management and/or care coordination needs related to: Transportation Needs  and [employer] .  Engaged with patient by telephone for follow up visit in response to provider referral for social work chronic care management and care coordination services.  Consent to Services:  The patient was given information about Chronic Care Management services, agreed to services, and gave verbal consent prior to initiation of services.  Please see initial visit note for detailed documentation.  Patient agreed to services and consent obtained.  Assessment: Review of patient past medical history, allergies, medications, and health status, including review of relevant consultants reports was performed today as part of a comprehensive evaluation and provision of chronic care management and care coordination services.    SDOH (Social Determinants of Health) assessments and interventions performed:   Advanced Directives Status: Not addressed in this encounter. CCM Care Plan  Allergies  Allergen Reactions  . Aspirin Other (See Comments)    Upset stomach  . Lactose Intolerance (Gi)     Outpatient Encounter Medications as of 01/25/2021  Medication Sig  . allopurinol (ZYLOPRIM) 300 MG tablet TAKE 1 TABLET TWICE DAILY  . amLODipine (NORVASC) 10 MG tablet Take 1 tablet (10 mg total) by mouth daily.  Marland Kitchen COLCRYS 0.6 MG tablet TAKE 1 TABLET BY MOUTH DAILY AS NEEDED  . diclofenac (VOLTAREN) 75 MG EC tablet TAKE 1 TABLET BY MOUTH 2 TIMES DAILY AS NEEDED. TAKE WITH FOOD.  Marland Kitchen lidocaine (LIDODERM) 5 % Place 1 patch onto the skin daily. Remove & Discard patch within 12 hours  . naloxone (NARCAN) nasal spray 4 mg/0.1 mL Spray nostril if  needed for opioid overuse- decreased consciousness, decreased respirations  . oxyCODONE-acetaminophen (PERCOCET) 10-325 MG tablet Take 1 tablet by mouth every 6 (six) hours as needed for pain. Discontinue other forms of oxycodone/Xtampza ER  . tiZANidine (ZANAFLEX) 4 MG tablet Take 1 tablet (4 mg total) by mouth every 8 (eight) hours as needed for muscle spasms.  . vitamin C (ASCORBIC ACID) 500 MG tablet Take 500 mg by mouth daily.   No facility-administered encounter medications on file as of 01/25/2021.    Patient Active Problem List   Diagnosis Date Noted  . Muscle spasms of both lower extremities 07/29/2020  . Bedbound 05/22/2020  . Fall 12/27/2019  . Unilateral osteoarthritis of hip, right 12/27/2019  . Severe obstructive sleep apnea 02/22/2018  . Hypertriglyceridemia 08/24/2017  . Low HDL (under 40) 08/24/2017  . Metabolic syndrome 08/24/2017  . Morbid obesity (HCC) 10/14/2013  . HTN (hypertension) 10/14/2013  . Chest pain 07/26/2012    Conditions to be addressed/monitored: current bedridden status; Transportation, Social Isolation, Limited access to caregiver, Inability to perform ADL's independently, Inability to perform IADL's independently and Lacks knowledge of community resource: CAPs, SCAT, ramp building assist,etc  Care Plan : Counsellor of Plan for Management of Functional Limitations and Needs  Updates made by Buck Mam, LCSW since 01/25/2021 12:00 AM    Problem: Social and Functional Symptoms     Long-Range Goal: Optimize pt's quality of life and opportunities   Start Date: 01/25/2021  This Visit's Progress: On track  Priority: High  Note:   Current barriers:   .  Transportation,  Housing barriers, Level of care concerns, ADL IADL limitations, Social Isolation, Limited access to caregiver, Inability to perform ADL's independently, Inability to perform IADL's independently, and Lacks knowledge of community resource Clinical Goals: Patient will work with CSW  to  address needs related to transportation, PCS/CAPS and other needs as assessed and identified Clinical Interventions: CSW spoke with pt by phone today and advised him of progress being made with [employer] for (hopeful) prior-auth on stretcher ride to PCP.  Pt also shared with CSW, that he is continuing to make progress with [facility] PT; stating he was able to sit on the edge of the bed and they are beginning to work on the "sliding" in hopes he can regain his ability to get out of bed. If pt is able to transfer from bed to wheelchair, he may then have additional options for transportation.  CSW encouraged pt to continue his work with PT and we will continue working with [facility] as well as other options for stretcher transport.  Pt resides in the home with his wife and 2 children. He is home alone some and has a Life Alert as well as a PCS caregiver 2.5 hours 7 days/week.  Pt would like to increase the hours he gets from [facility] (currently at 76 hours/month per pt).  CSW discussed applying for increase in hours as well as educated pt about the CAPS program which has a waiting list but could offer more hours of in-home care than PCS. CSW suggested pt contact DSS/Medicaid worker regarding CAPS. CSW will be emailing pt info on CAPS as well.  . Collaboration with Joaquim Nam, MD regarding development and update of comprehensive plan of care as evidenced by provider attestation and co-signature . Inter-disciplinary care team collaboration (see longitudinal plan of care) . Assessment of needs, barriers , agencies contacted, as well as how impacting  . Review various resources, discussed options and provided patient information about Department of Social Services (food stamps, [employer], and utilities assistance), Transportation provided by insurance provider, Enhanced Benefits connected with insurance provider, Referral to care guide for community support and other options, SCAT transportation, and [employer] Alternative Program  CAP . Patient interviewed and appropriate assessments performed . Referred patient to community resources care guide team for assistance with ramps (building/ex[expense,etc)  . Provided mental health counseling with regard to pt's current isolation and being homebound/bedbound . Provided patient with information about SCAT, CAPS, transportation resources . Discussed plans with patient for ongoing care management follow up and provided patient with direct contact information for care management team . Advised patient to call DSS worker to request CAPS services . Advised patient to complete PCS application/request for increase in hours provided . Collaborated with primary care provider re: insurance prior auth process for stretcher transportation . Assisted patient/caregiver with obtaining information about health plan benefits . Other interventions provided:Solution-Focused Strategies, Active listening / Reflection utilized , Emotional Supportive Provided, Problem Solving /Task Center , Psychoeducation /Health Education, Motivational Interviewing, Brief CBT , Increase in activities / exercise encouraged (with HHPT), and Provided basic mental health support, education   Patient Goals/Self-Care Activities: Over the next 60 days . Call Department of Social Services to apply for CAPS Community Alternative Program  [phone] . Complete SCAT application and have PCP complete Provider portion and sign . Continue with compliance of taking medication  . I will coordinate with your doctor to assist with prior auth request for transportation to PCP visit . Follow up on all resources provided for assistance .  Expect outreach calls from [place]- return calls if missed      Follow Up Plan: Appointment scheduled for SW follow up with client by phone on: 02/09/2021      Reece Levy MSW, LCSW Licensed Clinical Social Worker [facility] [place] [phone]

## 2021-01-25 NOTE — Patient Instructions (Signed)
Visit Information  PATIENT GOALS: Goals Addressed              This Visit's Progress   .  Need help to get out of house and to Dr Lianne Bushy (pt-stated)        Timeframe:  Long-Range Goal Priority:  High Start Date:       01/25/2021                      Expected End Date:  03/18/2021                     Follow Up Date 02/09/2021   -contact DSS/Medicaid worker to request CAPS services -complete PCS application to request additional hours of care in home -continue working with Home Health PT -expect outreach call from our team Care Guide to provide resources for ramps and other - begin a notebook of services in my neighborhood or community - call 211 when I need some help - follow-up on any referrals for help I am given - think ahead to make sure my need does not become an emergency - make a list of family or friends that I can call    Why is this important?    Knowing how and where to find help for yourself or family in your neighborhood and community is an important skill.   You will want to take some steps to learn how.    Notes:      The patient verbalized understanding of instructions, educational materials, and care plan provided today and declined offer to receive copy of patient instructions, educational materials, and care plan.   Telephone follow up appointment with care management team member scheduled for:02/09/21  Reece Levy MSW, LCSW Licensed Clinical Social Worker [facility] [phone]

## 2021-01-26 ENCOUNTER — Telehealth: Payer: Self-pay

## 2021-01-26 NOTE — Telephone Encounter (Signed)
Asia called from [place] req to speak to you in reference to an appeal you submitted

## 2021-01-26 NOTE — Telephone Encounter (Signed)
Received denial this morning from [employer] from [facility] services/skilled nursing. I called [employer] to do appeal for services at [phone]. Spoke with rep Osborne Casco to do appeal and was given fax number [phone] to fax medical records. Reference number for appeal is [id number]. I have printed and faxed requested records and will await appeal determination.

## 2021-01-27 ENCOUNTER — Telehealth: Payer: Self-pay

## 2021-01-27 NOTE — Telephone Encounter (Signed)
   Telephone encounter was:  Successful.  01/27/2021 Name: Michael Payne MRN: [id number] DOB: 20-Jul-1971  Michael Payne is a 50 y.o. year old male who is a primary care patient of Joaquim Nam, MD . The community resource team was consulted for assistance with wheelchair ramp resources  Care guide performed the following interventions: Patient provided with information about care guide support team and interviewed to confirm resource needs Spoke with [name] verified email address [email or web address] emailed information for [employer] and [facility]..  Follow Up Plan:  Care guide will follow up with patient by phone over the next 7 days  Nasif Bos, AAS Paralegal, [employer] Care Guide . Embedded Care Coordination [facility]  Care Management  [address] [place], [place] [address] ??[email or web address]  ?? [phone]   [email or web address]

## 2021-01-27 NOTE — Telephone Encounter (Signed)
I called Michael Payne back to see what she needed for the appeal and she already had everything and stated the appeal is still in process. [name] stated someone will call back once a determination has been made.

## 2021-01-27 NOTE — Telephone Encounter (Signed)
I called number provided and gave ref # that you provided as well to try to get this authorized. After 35 minutes on the phone trying to do this I was told this does not require authorization. I advised the lady that we have called multiple times and have been told it does; we do not have CPT codes for this but I did provide our Dx codes and NPI number. Not sure what else we can do as I provided everything I could. Just wanted to give you an update as I will be out of the office the rest of the week.

## 2021-01-28 ENCOUNTER — Telehealth: Payer: Self-pay | Admitting: Family Medicine

## 2021-01-28 NOTE — Telephone Encounter (Signed)
Noted. Thanks.

## 2021-01-28 NOTE — Telephone Encounter (Signed)
Michael Payne called in from [place] appeals and stated that home health was reapproved until 04/05/21 and the start date is 5/19.

## 2021-01-29 ENCOUNTER — Telehealth: Payer: Self-pay

## 2021-01-29 DIAGNOSIS — I10 Essential (primary) hypertension: Secondary | ICD-10-CM | POA: Diagnosis not present

## 2021-01-29 DIAGNOSIS — D649 Anemia, unspecified: Secondary | ICD-10-CM | POA: Diagnosis not present

## 2021-01-29 DIAGNOSIS — Z6841 Body Mass Index (BMI) 40.0 and over, adult: Secondary | ICD-10-CM | POA: Diagnosis not present

## 2021-01-29 DIAGNOSIS — M150 Primary generalized (osteo)arthritis: Secondary | ICD-10-CM | POA: Diagnosis not present

## 2021-01-29 DIAGNOSIS — K219 Gastro-esophageal reflux disease without esophagitis: Secondary | ICD-10-CM | POA: Diagnosis not present

## 2021-01-29 DIAGNOSIS — Z87891 Personal history of nicotine dependence: Secondary | ICD-10-CM | POA: Diagnosis not present

## 2021-01-29 DIAGNOSIS — G4733 Obstructive sleep apnea (adult) (pediatric): Secondary | ICD-10-CM | POA: Diagnosis not present

## 2021-01-29 DIAGNOSIS — Z9181 History of falling: Secondary | ICD-10-CM | POA: Diagnosis not present

## 2021-01-29 NOTE — Telephone Encounter (Signed)
   Telephone encounter was:  Successful.  01/29/2021 Name: Michael Payne MRN: [id number] DOB: [birth year]  Michael Payne is a 50 y.o. year old male who is a primary care patient of Joaquim Nam, MD . The community resource team was consulted for assistance with wheelchair ramp resources  Care guide performed the following interventions: Follow up call placed to the patient to discuss status of referral Spoke with patient he has received the emailed information for ramps.  He plans on calling the resources next week. No further assistance is needed at this time..  Follow Up Plan:  No further follow up planned at this time. The patient has been provided with needed resources.  Marchelle Rinella, AAS Paralegal, [facility] Care Guide . Embedded Care Coordination [facility]  Care Management  [address] [place], [place] [address] ??[email or web address]  ?? [phone]   [email or web address]

## 2021-02-02 ENCOUNTER — Telehealth: Payer: Self-pay

## 2021-02-02 DIAGNOSIS — M150 Primary generalized (osteo)arthritis: Secondary | ICD-10-CM | POA: Diagnosis not present

## 2021-02-02 DIAGNOSIS — K219 Gastro-esophageal reflux disease without esophagitis: Secondary | ICD-10-CM | POA: Diagnosis not present

## 2021-02-02 DIAGNOSIS — G4733 Obstructive sleep apnea (adult) (pediatric): Secondary | ICD-10-CM | POA: Diagnosis not present

## 2021-02-02 DIAGNOSIS — Z9181 History of falling: Secondary | ICD-10-CM | POA: Diagnosis not present

## 2021-02-02 DIAGNOSIS — Z6841 Body Mass Index (BMI) 40.0 and over, adult: Secondary | ICD-10-CM | POA: Diagnosis not present

## 2021-02-02 DIAGNOSIS — D649 Anemia, unspecified: Secondary | ICD-10-CM | POA: Diagnosis not present

## 2021-02-02 DIAGNOSIS — I10 Essential (primary) hypertension: Secondary | ICD-10-CM | POA: Diagnosis not present

## 2021-02-02 DIAGNOSIS — Z87891 Personal history of nicotine dependence: Secondary | ICD-10-CM | POA: Diagnosis not present

## 2021-02-02 NOTE — Telephone Encounter (Signed)
Called Jae Dire back and verbal orders have been given

## 2021-02-02 NOTE — Telephone Encounter (Signed)
Michael Payne PT with [facility] left v/m requesting verbal orders for [facility] PT for additional HH PT 1 x a wk for 9 wks.

## 2021-02-02 NOTE — Telephone Encounter (Signed)
Please give the order.  Thanks.   

## 2021-02-03 DIAGNOSIS — Z87891 Personal history of nicotine dependence: Secondary | ICD-10-CM | POA: Diagnosis not present

## 2021-02-03 DIAGNOSIS — M150 Primary generalized (osteo)arthritis: Secondary | ICD-10-CM | POA: Diagnosis not present

## 2021-02-03 DIAGNOSIS — I10 Essential (primary) hypertension: Secondary | ICD-10-CM | POA: Diagnosis not present

## 2021-02-03 DIAGNOSIS — D649 Anemia, unspecified: Secondary | ICD-10-CM | POA: Diagnosis not present

## 2021-02-03 DIAGNOSIS — Z6841 Body Mass Index (BMI) 40.0 and over, adult: Secondary | ICD-10-CM | POA: Diagnosis not present

## 2021-02-03 DIAGNOSIS — Z9181 History of falling: Secondary | ICD-10-CM | POA: Diagnosis not present

## 2021-02-03 DIAGNOSIS — G4733 Obstructive sleep apnea (adult) (pediatric): Secondary | ICD-10-CM | POA: Diagnosis not present

## 2021-02-03 DIAGNOSIS — K219 Gastro-esophageal reflux disease without esophagitis: Secondary | ICD-10-CM | POA: Diagnosis not present

## 2021-02-04 DIAGNOSIS — K219 Gastro-esophageal reflux disease without esophagitis: Secondary | ICD-10-CM | POA: Diagnosis not present

## 2021-02-04 DIAGNOSIS — D649 Anemia, unspecified: Secondary | ICD-10-CM | POA: Diagnosis not present

## 2021-02-04 DIAGNOSIS — Z9181 History of falling: Secondary | ICD-10-CM | POA: Diagnosis not present

## 2021-02-04 DIAGNOSIS — Z6841 Body Mass Index (BMI) 40.0 and over, adult: Secondary | ICD-10-CM | POA: Diagnosis not present

## 2021-02-04 DIAGNOSIS — Z87891 Personal history of nicotine dependence: Secondary | ICD-10-CM | POA: Diagnosis not present

## 2021-02-04 DIAGNOSIS — G4733 Obstructive sleep apnea (adult) (pediatric): Secondary | ICD-10-CM | POA: Diagnosis not present

## 2021-02-04 DIAGNOSIS — I10 Essential (primary) hypertension: Secondary | ICD-10-CM | POA: Diagnosis not present

## 2021-02-04 DIAGNOSIS — M150 Primary generalized (osteo)arthritis: Secondary | ICD-10-CM | POA: Diagnosis not present

## 2021-02-06 DIAGNOSIS — M1611 Unilateral primary osteoarthritis, right hip: Secondary | ICD-10-CM | POA: Diagnosis not present

## 2021-02-08 DIAGNOSIS — M6281 Muscle weakness (generalized): Secondary | ICD-10-CM | POA: Diagnosis not present

## 2021-02-09 ENCOUNTER — Telehealth: Payer: Self-pay | Admitting: Family Medicine

## 2021-02-09 ENCOUNTER — Telehealth: Payer: Medicare HMO

## 2021-02-09 DIAGNOSIS — K219 Gastro-esophageal reflux disease without esophagitis: Secondary | ICD-10-CM | POA: Diagnosis not present

## 2021-02-09 DIAGNOSIS — M150 Primary generalized (osteo)arthritis: Secondary | ICD-10-CM | POA: Diagnosis not present

## 2021-02-09 DIAGNOSIS — Z9181 History of falling: Secondary | ICD-10-CM | POA: Diagnosis not present

## 2021-02-09 DIAGNOSIS — G4733 Obstructive sleep apnea (adult) (pediatric): Secondary | ICD-10-CM | POA: Diagnosis not present

## 2021-02-09 DIAGNOSIS — Z87891 Personal history of nicotine dependence: Secondary | ICD-10-CM | POA: Diagnosis not present

## 2021-02-09 DIAGNOSIS — M1611 Unilateral primary osteoarthritis, right hip: Secondary | ICD-10-CM

## 2021-02-09 DIAGNOSIS — Z6841 Body Mass Index (BMI) 40.0 and over, adult: Secondary | ICD-10-CM | POA: Diagnosis not present

## 2021-02-09 DIAGNOSIS — D649 Anemia, unspecified: Secondary | ICD-10-CM | POA: Diagnosis not present

## 2021-02-09 DIAGNOSIS — I10 Essential (primary) hypertension: Secondary | ICD-10-CM | POA: Diagnosis not present

## 2021-02-09 NOTE — Telephone Encounter (Signed)
  LAST APPOINTMENT DATE: 02/09/2021   NEXT APPOINTMENT DATE:@5 /26/2022  MEDICATION: oxcyodone  PH[facility]AC[facility] [address]  Let patient know to contact pharmacy at the end of the day to make sure medication is ready.  Please notify patient to allow 48-72 hours to process  Encourage patient to contact the pharmacy for refills or they can request refills through [facility]  CLINICAL FILLS OUT ALL BELOW:   LAST REFILL:  QTY:  REFILL DATE:    OTHER COMMENTS:    Okay for refill?  Please advise

## 2021-02-09 NOTE — Telephone Encounter (Signed)
Last refilled 01/04/21 #120/0

## 2021-02-10 ENCOUNTER — Other Ambulatory Visit: Payer: Self-pay | Admitting: Family Medicine

## 2021-02-10 ENCOUNTER — Telehealth: Payer: Self-pay | Admitting: Family Medicine

## 2021-02-10 DIAGNOSIS — G8929 Other chronic pain: Secondary | ICD-10-CM

## 2021-02-10 DIAGNOSIS — M1611 Unilateral primary osteoarthritis, right hip: Secondary | ICD-10-CM

## 2021-02-10 MED ORDER — OXYCODONE-ACETAMINOPHEN 10-325 MG PO TABS: 1.0000 | ORAL_TABLET | Freq: Four times a day (QID) | ORAL | 0 refills | Status: DC | PRN

## 2021-02-10 NOTE — Telephone Encounter (Signed)
Sent. Thanks.   

## 2021-02-10 NOTE — Addendum Note (Signed)
Addended by: Joaquim Nam on: 02/10/2021 04:19 PM   Modules accepted: Orders

## 2021-02-10 NOTE — Telephone Encounter (Signed)
Patient has questions about his medication change on the following: Oxycodone Please call him back to discuss EM

## 2021-02-10 NOTE — Telephone Encounter (Signed)
Refill request for lidocaine 5% patches  LOV - 12/06/20 Next OV - 02/23/21 Last refill - 11/12/20 #30/2

## 2021-02-11 ENCOUNTER — Ambulatory Visit: Payer: Medicare HMO

## 2021-02-11 DIAGNOSIS — M1611 Unilateral primary osteoarthritis, right hip: Secondary | ICD-10-CM

## 2021-02-11 DIAGNOSIS — M25551 Pain in right hip: Secondary | ICD-10-CM

## 2021-02-11 DIAGNOSIS — Z9181 History of falling: Secondary | ICD-10-CM | POA: Diagnosis not present

## 2021-02-11 DIAGNOSIS — I10 Essential (primary) hypertension: Secondary | ICD-10-CM

## 2021-02-11 DIAGNOSIS — M150 Primary generalized (osteo)arthritis: Secondary | ICD-10-CM | POA: Diagnosis not present

## 2021-02-11 DIAGNOSIS — K219 Gastro-esophageal reflux disease without esophagitis: Secondary | ICD-10-CM | POA: Diagnosis not present

## 2021-02-11 DIAGNOSIS — Z6841 Body Mass Index (BMI) 40.0 and over, adult: Secondary | ICD-10-CM | POA: Diagnosis not present

## 2021-02-11 DIAGNOSIS — G4733 Obstructive sleep apnea (adult) (pediatric): Secondary | ICD-10-CM | POA: Diagnosis not present

## 2021-02-11 DIAGNOSIS — Z87891 Personal history of nicotine dependence: Secondary | ICD-10-CM | POA: Diagnosis not present

## 2021-02-11 DIAGNOSIS — G8929 Other chronic pain: Secondary | ICD-10-CM

## 2021-02-11 DIAGNOSIS — D649 Anemia, unspecified: Secondary | ICD-10-CM | POA: Diagnosis not present

## 2021-02-11 NOTE — Chronic Care Management (AMB) (Signed)
Chronic Care Management   CCM RN Visit Note  02/13/2021 Name: Michael Payne MRN: [id number] DOB: 03/12/1971  Subjective: Michael Payne is a 50 y.o. year old male who is a primary care patient of [name], MD. The care management team was consulted for assistance with disease management and care coordination needs.    Engaged with patient by telephone for initial visit in response to provider referral for case management and/or care coordination services.   Consent to Services:  The patient was given the following information about Chronic Care Management services today, agreed to services, and gave verbal consent: 1. CCM service includes personalized support from designated clinical staff supervised by the primary care provider, including individualized plan of care and coordination with other care providers 2. 24/7 contact phone numbers for assistance for urgent and routine care needs. 3. Service will only be billed when office clinical staff spend 20 minutes or more in a month to coordinate care. 4. Only one practitioner may furnish and bill the service in a calendar month. 5.The patient may stop CCM services at any time (effective at the end of the month) by phone call to the office staff. 6. The patient will be responsible for cost sharing (co-pay) of up to 20% of the service fee (after annual deductible is met). Patient agreed to services and consent obtained.  Patient agreed to services and verbal consent obtained.   Assessment: Review of patient past medical history, allergies, medications, health status, including review of consultants reports, laboratory and other test data, was performed as part of comprehensive evaluation and provision of chronic care management services.   SDOH (Social Determinants of Health) assessments and interventions performed:  SDOH Interventions   Flowsheet Row Most Recent Value  SDOH Interventions   Food Insecurity Interventions Intervention Not  Indicated  Housing Interventions Intervention Not Indicated  Transportation Interventions Other (Comment)  [Patient is receiving follow up with social worker to assist with transporation needs.]       CCM Care Plan  Allergies  Allergen Reactions  . Aspirin Other (See Comments)    Upset stomach  . Lactose Intolerance (Gi)     Outpatient Encounter Medications as of 02/11/2021  Medication Sig Note  . allopurinol (ZYLOPRIM) 300 MG tablet TAKE 1 TABLET TWICE DAILY   . amLODipine (NORVASC) 10 MG tablet Take 1 tablet (10 mg total) by mouth daily.   Marland Kitchen COLCRYS 0.6 MG tablet TAKE 1 TABLET BY MOUTH DAILY AS NEEDED 02/11/2021: Patient states he is taking 2 times per day  . lidocaine (LIDODERM) 5 % PLACE 1 PATCH ONTO THE SKIN DAILY. REMOVE & DISCARD PATCH WITHIN 12 HOURS   . naloxone (NARCAN) nasal spray 4 mg/0.1 mL Spray nostril if needed for opioid overuse- decreased consciousness, decreased respirations   . oxyCODONE-acetaminophen (PERCOCET) 10-325 MG tablet Take 1 tablet by mouth every 6 (six) hours as needed for pain.   Marland Kitchen tiZANidine (ZANAFLEX) 4 MG tablet Take 1 tablet (4 mg total) by mouth every 8 (eight) hours as needed for muscle spasms.   . vitamin C (ASCORBIC ACID) 500 MG tablet Take 500 mg by mouth daily.   . [DISCONTINUED] diclofenac (VOLTAREN) 75 MG EC tablet TAKE 1 TABLET BY MOUTH 2 TIMES DAILY AS NEEDED. TAKE WITH FOOD.    No facility-administered encounter medications on file as of 02/11/2021.    Patient Active Problem List   Diagnosis Date Noted  . Muscle spasms of both lower extremities 07/29/2020  . Bedbound 05/22/2020  .  Fall 12/27/2019  . Unilateral osteoarthritis of hip, right 12/27/2019  . Severe obstructive sleep apnea 02/22/2018  . Hypertriglyceridemia 08/24/2017  . Low HDL (under 40) 08/24/2017  . Metabolic syndrome 41/32/4401  . Morbid obesity ([place]) 10/14/2013  . HTN (hypertension) 10/14/2013  . Chest pain 07/26/2012    Conditions to be addressed/monitored:HTN  and chronic pain, obesity  Care Plan : Chronic pain  Updates made by Dannielle Karvonen, RN since 02/13/2021 12:00 AM  Problem: Decreased mobility due to osteoarthritis pain and obesity   Priority: High  Onset Date: 02/11/2021  Long-Range Goal: Patient will perform physical activity within limits of disease   Start Date: 02/11/2021  Expected End Date: 05/19/2021  This Visit's Progress: On track  Priority: High  Current Barriers: 50 year old with history of osteoarthritis of right hip, obesity and chronic pain that is bed and home bound.  Patient unable to attend doctor visits at this time due to not having appropriate transportation assistance to get out of his home.   . Knowledge Deficits related to self-health management of chronic pain . Chronic Disease Management support and education needs related to chronic pain . Transportation barriers:  Patient currently working with [employer] transportation assistance to attend provider visits.  . Not attend all scheduled provider appointments . Unable to perform ADLs independently: Patient currently has PCS services 2 1/2 hours a day, 7 days a week. Patient states he would like to increase the hours he is receiving for these services. Patient was advised by the social worker to contact his case worker with DSS to apply for increase hours.  . Unable to perform IADLs independently. Patient wife is caregiver.  Clinical Goal(s):  . patient will verbalize understanding of plan for pain management. . Patient will demonstrate use of different relaxation  skills and/or diversional activities to assist with pain reduction (distraction, imagery, relaxation, massage, acupressure, TENS, heat, and cold application.,  . patient will report pain at a level less than 3 to 4 on a 10-10 rating scale., patient will use pharmacological and nonpharmacological pain relief strategies as prescribed. . patient will verbalize acceptable level of pain relief and  ability to engage in desired activities . Patient will work on healthy eating for weight loss Interventions:  . Collaboration with [name], MD regarding development and update of comprehensive plan of care as evidenced by provider attestation and co-signature . Pain assessment performed . Discussed plans with patient for ongoing care management follow up and provided patient with direct contact information for care management team . Reviewed medications with patient and discussed  . Provided patient with educational materials related to chronic pain management . Reviewed scheduled/upcoming provider appointments   . Notified social worker patient would like counseling resources for coping with chronic pain. . Due to bed bound status, advised patient to turn frequently to prevent skin breakdown.  Discussed skin breakdown signs/ symptoms.  . Provided patient with educational material related to healthy food choices.   Patient Goals/Self Care Activities:  . Take your medications as prescribed.  . Attend doctors visits as recommended.  (virtually and/ or in person when able) . Refill your medications timely . Call your provider office for new concerns or questions . Turn frequently in bed to prevent skin breakdown. Marland Kitchen Be aware of signs of skin break down: redness and skin tears.  Report this to your doctor . Continue to work with home health physical therapy. Perform home exercises as instructed by your physical  therapist.  . Apply hot or cold pack for pain relief to affected area . Use assistive devices as recommended by your provider and physical therapist . Continue to work with social worker regarding transportation issues.  . Incorporate healthy food choices to assist with weight loss.  Follow Up Plan: The patient has been provided with contact information for the care management team and has been advised to call with any health related questions or concerns.  The care management  team will reach out to the patient again over the next 30 days.     Care Plan : Hypertension (Adult)  Updates made by Dannielle Karvonen, RN since 02/13/2021 12:00 AM  Problem: Hypertension (Hypertension)   Priority: High  Onset Date: 02/11/2021  Long-Range Goal: Hypertension Monitored   Start Date: 02/11/2021  Expected End Date: 05/19/2021  This Visit's Progress: On track  Priority: Medium  Objective:  . Last practice recorded BP readings:  BP Readings from Last 3 Encounters:  11/05/20 127/86  06/27/20 (!) 144/83  01/03/20 (!) 145/87   Current Barriers: Patient with history of hypertension, severe obstructive sleep apnea, osteoarthritis of right hip, and obesity who is home and bed bound. Patient unable to attend doctor visits at this time due to not having appropriate transportation assistance to get out of his home.   . Knowledge Deficits related to basic understanding of hypertension pathophysiology and self care management . Transportation barriers . Not attend all scheduled provider appointments . Unable to perform ADLs independently: Patient currently has PCS services 2 1/2 hours a day, 7 days a week. Patient states he would like to increase the hours he is receiving for these services. Patient was advised by the social worker to contact his case worker with DSS to apply for increase hours.  Case Manager Clinical Goal(s):  . patient will demonstrate improved adherence to prescribed treatment plan for hypertension as evidenced by taking all medications as prescribed, monitoring and recording blood pressure as directed, adhering to low sodium/DASH diet . patient will verbalize basic understanding of hypertension disease process and self health management plan. Interventions:  . Collaboration with [name], MD regarding development and update of comprehensive plan of care as evidenced by provider attestation and co-signature . Inter-disciplinary care team collaboration (see  longitudinal plan of care):  Patient currently working with social worker for stretcher transportation assistance to attend provider visits.  . Evaluation of current treatment plan related to hypertension self management and patient's adherence to plan as established by provider. . Provided education to patient re: stroke prevention, s/s of heart attack and stroke, DASH diet, complications of uncontrolled blood pressure . Reviewed medications with patient and discussed importance of compliance . Discussed plans with patient for ongoing care management follow up and provided patient with direct contact information for care management team . Advised patient, providing education and rationale, to monitor blood pressure daily and record, calling PCP for findings outside established parameters.  . Reviewed scheduled/upcoming provider appointments . Discussed importance of using CPAP machine for sleep apnea Self-Care Activities: - Self administers medications as prescribed Calls provider office for new concerns, questions, or BP outside discussed parameters Checks BP and records as discussed Follows a low sodium diet/DASH diet Patient Goals: - check blood pressure 3 times per week - write blood pressure results in a log or diary - Follow low sodium/ DASH diet.  - Take your medications as prescribed.  - Attend doctor visits whether virtually and/ or in person (when transportation needs  are arranged).  - Use CPAP as recommended by your doctor.  Follow Up Plan: The patient has been provided with contact information for the care management team and has been advised to call with any health related questions or concerns.  The care management team will reach out to the patient again over the next 30 days.        Plan:The patient has been provided with contact information for the care management team and has been advised to call with any health related questions or concerns.  and The care management team  will reach out to the patient again over the next 30 days. Quinn Plowman RN,BSN,CCM RN Case Manager [place]  [phone]

## 2021-02-11 NOTE — Telephone Encounter (Signed)
Spoke with patient and states he was able to get his prescription taken care of and does not need anything at this time.

## 2021-02-12 ENCOUNTER — Telehealth: Payer: Self-pay | Admitting: Family Medicine

## 2021-02-12 NOTE — Telephone Encounter (Signed)
Michael Payne from [facility] called to see if we had sent in an authorization for patient to receive non-emergent ambulance transportation for his appt on 6/7. Please advise.

## 2021-02-13 NOTE — Patient Instructions (Signed)
Visit Information:  Thank you for taking the time to speak with me today.  PATIENT GOALS:  Goals Addressed            This Visit's Progress   . Monitoring and management of High Blood pressure   On track    Timeframe:  Long-Range Goal Priority:  Medium Start Date:  02/11/2021                          Expected End Date:    05/19/2021          Follow up:  03/11/2021          Patient Goals:  - check blood pressure 3 times per week - write blood pressure results in a log or diary - Follow low sodium/ DASH diet.  - Take your medications as prescribed.  - Attend doctor visits whether virtually and/ or in person (when transportation needs are arranged).  - Use CPAP as recommended by your doctor.       . Pain Management Plan Developed   On track    Timeframe:  Long-Range Goal Priority:  High Start Date:      02/11/2021                       Expected End Date: 05/19/2021  Follow up:  Patient goals  .     Take your medications as prescribed.  . Attend doctors visits as recommended.  (virtually and/ or in person when able) . Refill your medications timely . Call your provider office for new concerns or questions . Turn frequently in bed to prevent skin breakdown. Marland Kitchen Be aware of signs of skin break down: redness and skin tears.  Report this to your doctor . Continue to work with home health physical therapy. Perform home exercises as instructed by your physical therapist.  . Apply hot or cold pack for pain relief to affected area . Use assistive devices as recommended by your provider and physical therapist . Continue to work with social worker regarding transportation issues.  . Incorporate healthy food choices to assist with weight loss.                      . COMPLETED: Patient Stated         Chronic Pain, Adult Chronic pain is a type of pain that lasts or keeps coming back for at least 3-6 months. You may have headaches, pain in the abdomen, or pain in other areas of the  body. Chronic pain may be related to an illness, such as fibromyalgia or complex regional pain syndrome. Chronic pain may also be related to an injury or a health condition. Sometimes, the cause of chronic pain is not known. Chronic pain can make it hard for you to do daily activities. If not treated, chronic pain can lead to anxiety and depression. Treatment depends on the cause and severity of your pain. You may need to work with a pain specialist to come up with a treatment plan. The plan may include medicine, counseling, and physical therapy. Many people benefit from a combination of two or more types of treatment to control their pain. Follow these instructions at home: Medicines  Take over-the-counter and prescription medicines only as told by your health care provider.  Ask your health care provider if the medicine prescribed to you: ? Requires you to avoid driving or using machinery. ? Can  cause constipation. You may need to take these actions to prevent or treat constipation:  Drink enough fluid to keep your urine pale yellow.  Take over-the-counter or prescription medicines.  Eat foods that are high in fiber, such as beans, whole grains, and fresh fruits and vegetables.  Limit foods that are high in fat and processed sugars, such as fried or sweet foods. Treatment plan Follow your treatment plan as told by your health care provider. This may include:  Gentle, regular exercise.  Eating a healthy diet that includes foods such as vegetables, fruits, fish, and lean meats.  Cognitive or behavioral therapy that changes the way you think or act in response to the pain. This may help improve how you feel.  Working with a physical therapist.  Meditation, yoga, acupuncture, or massage therapy.  Aroma, color, light, or sound therapy.  Local electrical stimulation. The electrical pulses help to relieve pain by temporarily stopping the nerve impulses that cause you to feel  pain.  Injections. These deliver numbing or pain-relieving medicines into the spine or the area of pain.   Lifestyle  Ask your health care provider whether you should keep a pain diary. Your health care provider will tell you what information to write in the diary. This may include when you have pain, what the pain feels like, and how medicines and other behaviors or treatments help to reduce the pain.  Consider talking with a mental health care provider about how to manage chronic pain.  Consider joining a chronic pain support group.  Try to control or lower your stress levels. Talk with your health care provider about ways to do this.   General instructions  Learn as much as you can about how to manage your chronic pain. Ask your health care provider if an intensive pain rehabilitation program or a chronic pain specialist would be helpful.  Check your pain level as told by your health care provider. Ask your health care provider if you should use a pain scale.  It is up to you to get the results of any tests that were done. Ask your health care provider, or the department that is doing the tests, when your results will be ready.  Keep all follow-up visits as told by your health care provider. This is important. Contact a health care provider if:  Your pain gets worse, or you have new pain.  You have trouble sleeping.  You have trouble doing your normal activities.  Your pain is not controlled with treatment.  You have side effects from pain medicine.  You feel weak.  You notice any other changes that show that your condition is getting worse. Get help right away if:  You lose feeling or have numbness in your body.  You lose control of bowel or bladder function.  Your pain suddenly gets much worse.  You develop shaking or chills.  You develop confusion.  You develop chest pain.  You have trouble breathing or shortness of breath.  You pass out.  You have thoughts  about hurting yourself or others. If you ever feel like you may hurt yourself or others, or have thoughts about taking your own life, get help right away. Go to your nearest emergency department or:  Call your local emergency services (911 in the [place]  Call a suicide crisis helpline, such as the [facility] at (984) 823-5207. This is open 24 hours a day in the [place]  Text the Crisis Text Line at 512-032-0091 (  in the [place] Summary  Chronic pain is a type of pain that lasts or keeps coming back for at least 3-6 months.  Chronic pain may be related to an illness, injury, or other health condition. Sometimes, the cause of chronic pain is not known.  Treatment depends on the cause and severity of your pain.  Many people benefit from a combination of two or more types of treatment to control their pain.  Follow your treatment plan as told by your health care provider. This information is not intended to replace advice given to you by your health care provider. Make sure you discuss any questions you have with your health care provider. Document Revised: 05/23/2019 Document Reviewed: 05/23/2019 Elsevier Patient Education  2021 [place] Following a healthy eating pattern may help you to achieve and maintain a healthy body weight, reduce the risk of chronic disease, and live a long and productive life. It is important to follow a healthy eating pattern at an appropriate calorie level for your body. Your nutritional needs should be met primarily through food by choosing a variety of nutrient-rich foods. What are tips for following this plan? Reading food labels  Read labels and choose the following: ? Reduced or low sodium. ? Juices with 100% fruit juice. ? Foods with low saturated fats and high polyunsaturated and monounsaturated fats. ? Foods with whole grains, such as whole wheat, cracked wheat, brown rice, and wild rice. ? Whole grains that are  fortified with folic acid. This is recommended for women who are pregnant or who want to become pregnant.  Read labels and avoid the following: ? Foods with a lot of added sugars. These include foods that contain brown sugar, corn sweetener, corn syrup, dextrose, fructose, glucose, high-fructose corn syrup, honey, invert sugar, lactose, malt syrup, maltose, molasses, raw sugar, sucrose, trehalose, or turbinado sugar.  Do not eat more than the following amounts of added sugar per day:  6 teaspoons (25 g) for women.  9 teaspoons (38 g) for men. ? Foods that contain processed or refined starches and grains. ? Refined grain products, such as white flour, degermed cornmeal, white bread, and white rice. Shopping  Choose nutrient-rich snacks, such as vegetables, whole fruits, and nuts. Avoid high-calorie and high-sugar snacks, such as potato chips, fruit snacks, and candy.  Use oil-based dressings and spreads on foods instead of solid fats such as butter, stick margarine, or cream cheese.  Limit pre-made sauces, mixes, and "instant" products such as flavored rice, instant noodles, and ready-made pasta.  Try more plant-protein sources, such as tofu, tempeh, black beans, edamame, lentils, nuts, and seeds.  Explore eating plans such as the Mediterranean diet or vegetarian diet. Cooking  Use oil to saut or stir-fry foods instead of solid fats such as butter, stick margarine, or lard.  Try baking, boiling, grilling, or broiling instead of frying.  Remove the fatty part of meats before cooking.  Steam vegetables in water or broth. Meal planning  At meals, imagine dividing your plate into fourths: ? One-half of your plate is fruits and vegetables. ? One-fourth of your plate is whole grains. ? One-fourth of your plate is protein, especially lean meats, poultry, eggs, tofu, beans, or nuts.  Include low-fat dairy as part of your daily diet.   Lifestyle  Choose healthy options in all  settings, including home, work, school, restaurants, or stores.  Prepare your food safely: ? Wash your hands after handling raw meats. ? Keep food preparation surfaces  clean by regularly washing with hot, soapy water. ? Keep raw meats separate from ready-to-eat foods, such as fruits and vegetables. ? Cook seafood, meat, poultry, and eggs to the recommended internal temperature. ? Store foods at safe temperatures. In general:  Keep cold foods at 35F (4.4C) or below.  Keep hot foods at 135F (60C) or above.  Keep your freezer at [facility] (-17.8C) or below.  Foods are no longer safe to eat when they have been between the temperatures of 40-135F (4.4-60C) for more than 2 hours. What foods should I eat? Fruits Aim to eat 2 cup-equivalents of fresh, canned (in natural juice), or frozen fruits each day. Examples of 1 cup-equivalent of fruit include 1 small apple, 8 large strawberries, 1 cup canned fruit,  cup dried fruit, or 1 cup 100% juice. Vegetables Aim to eat 2-3 cup-equivalents of fresh and frozen vegetables each day, including different varieties and colors. Examples of 1 cup-equivalent of vegetables include 2 medium carrots, 2 cups raw, leafy greens, 1 cup chopped vegetable (raw or cooked), or 1 medium baked potato. Grains Aim to eat 6 ounce-equivalents of whole grains each day. Examples of 1 ounce-equivalent of grains include 1 slice of bread, 1 cup ready-to-eat cereal, 3 cups popcorn, or  cup cooked rice, pasta, or cereal. Meats and other proteins Aim to eat 5-6 ounce-equivalents of protein each day. Examples of 1 ounce-equivalent of protein include 1 egg, 1/2 cup nuts or seeds, or 1 tablespoon (16 g) peanut butter. A cut of meat or fish that is the size of a deck of cards is about 3-4 ounce-equivalents.  Of the protein you eat each week, try to have at least 8 ounces come from seafood. This includes salmon, trout, herring, and anchovies. Dairy Aim to eat 3 cup-equivalents of  fat-free or low-fat dairy each day. Examples of 1 cup-equivalent of dairy include 1 cup (240 mL) milk, 8 ounces (250 g) yogurt, 1 ounces (44 g) natural cheese, or 1 cup (240 mL) fortified soy milk. Fats and oils  Aim for about 5 teaspoons (21 g) per day. Choose monounsaturated fats, such as canola and olive oils, avocados, peanut butter, and most nuts, or polyunsaturated fats, such as sunflower, corn, and soybean oils, walnuts, pine nuts, sesame seeds, sunflower seeds, and flaxseed. Beverages  Aim for six 8-oz glasses of water per day. Limit coffee to three to five 8-oz cups per day.  Limit caffeinated beverages that have added calories, such as soda and energy drinks.  Limit alcohol intake to no more than 1 drink a day for nonpregnant women and 2 drinks a day for men. One drink equals 12 oz of beer (355 mL), 5 oz of wine (148 mL), or 1 oz of hard liquor (44 mL). Seasoning and other foods  Avoid adding excess amounts of salt to your foods. Try flavoring foods with herbs and spices instead of salt.  Avoid adding sugar to foods.  Try using oil-based dressings, sauces, and spreads instead of solid fats. This information is based on general [place] nutrition guidelines. For more information, visit [employer]. Exact amounts may vary based on your nutrition needs. Summary  A healthy eating plan may help you to maintain a healthy weight, reduce the risk of chronic diseases, and stay active throughout your life.  Plan your meals. Make sure you eat the right portions of a variety of nutrient-rich foods.  Try baking, boiling, grilling, or broiling instead of frying.  Choose healthy options in all settings, including home, work, school,  restaurants, or stores. This information is not intended to replace advice given to you by your health care provider. Make sure you discuss any questions you have with your health care provider. Document Revised: 12/18/2017 Document Reviewed:  12/18/2017 Elsevier Patient Education  2021 [place].   Consent to CCM Services: Michael Payne was given information about Chronic Care Management services today including:  1. CCM service includes personalized support from designated clinical staff supervised by his physician, including individualized plan of care and coordination with other care providers 2. 24/7 contact phone numbers for assistance for urgent and routine care needs. 3. Service will only be billed when office clinical staff spend 20 minutes or more in a month to coordinate care. 4. Only one practitioner may furnish and bill the service in a calendar month. 5. The patient may stop CCM services at any time (effective at the end of the month) by phone call to the office staff. 6. The patient will be responsible for cost sharing (co-pay) of up to 20% of the service fee (after annual deductible is met).  Patient agreed to services and verbal consent obtained.   Patient verbalizes understanding of instructions provided today and agrees to view in [place].   The patient has been provided with contact information for the care management team and has been advised to call with any health related questions or concerns.  The care management team will reach out to the patient again over the next 30 days.   Quinn Plowman RN,BSN,CCM RN Case Manager [facility]  [phone]    CLINICAL CARE PLAN: Patient Care Plan: Chronic pain  Problem Identified: Decreased mobility due to osteoarthritis pain and obesity   Priority: High  Onset Date: 02/11/2021  Long-Range Goal: Patient will perform physical activity within limits of disease   Start Date: 02/11/2021  Expected End Date: 05/19/2021  This Visit's Progress: On track  Priority: High  Current Barriers: 50 year old with history of osteoarthritis of right hip, obesity and chronic pain that is bed and home bound.  Patient unable to attend doctor visits at this time due to not  having appropriate transportation assistance to get out of his home.   . Knowledge Deficits related to self-health management of chronic pain . Chronic Disease Management support and education needs related to chronic pain . Transportation barriers:  Patient currently working with [employer] transportation assistance to attend provider visits.  . Not attend all scheduled provider appointments . Unable to perform ADLs independently: Patient currently has PCS services 2 1/2 hours a day, 7 days a week. Patient states he would like to increase the hours he is receiving for these services. Patient was advised by the social worker to contact his case worker with DSS to apply for increase hours.  . Unable to perform IADLs independently. Patient wife is caregiver.  Clinical Goal(s):  . patient will verbalize understanding of plan for pain management. . Patient will demonstrate use of different relaxation  skills and/or diversional activities to assist with pain reduction (distraction, imagery, relaxation, massage, acupressure, TENS, heat, and cold application.,  . patient will report pain at a level less than 3 to 4 on a 10-10 rating scale., patient will use pharmacological and nonpharmacological pain relief strategies as prescribed. . patient will verbalize acceptable level of pain relief and ability to engage in desired activities . Patient will work on healthy eating for weight loss Interventions:  . Collaboration with [name], MD regarding development and  update of comprehensive plan of care as evidenced by provider attestation and co-signature . Pain assessment performed . Discussed plans with patient for ongoing care management follow up and provided patient with direct contact information for care management team . Reviewed medications with patient and discussed  . Provided patient with educational materials related to chronic pain management . Reviewed scheduled/upcoming  provider appointments   . Notified social worker patient would like counseling resources for coping with chronic pain. . Due to bed bound status, advised patient to turn frequently to prevent skin breakdown.  Discussed skin breakdown signs/ symptoms.  . Provided patient with educational material related to healthy food choices.   Patient Goals/Self Care Activities:  . Take your medications as prescribed.  . Attend doctors visits as recommended.  (virtually and/ or in person when able) . Refill your medications timely . Call your provider office for new concerns or questions . Turn frequently in bed to prevent skin breakdown. Marland Kitchen Be aware of signs of skin break down: redness and skin tears.  Report this to your doctor . Continue to work with home health physical therapy. Perform home exercises as instructed by your physical therapist.  . Apply hot or cold pack for pain relief to affected area . Use assistive devices as recommended by your provider and physical therapist . Continue to work with social worker regarding transportation issues.  . Incorporate healthy food choices to assist with weight loss.  Follow Up Plan: The patient has been provided with contact information for the care management team and has been advised to call with any health related questions or concerns.  The care management team will reach out to the patient again over the next 30 days.      Patient Care Plan: Hypertension (Adult)  Problem Identified: Hypertension (Hypertension)   Priority: High  Onset Date: 02/11/2021  Long-Range Goal: Hypertension Monitored   Start Date: 02/11/2021  Expected End Date: 05/19/2021  This Visit's Progress: On track  Priority: Medium  Objective:  . Last practice recorded BP readings:  BP Readings from Last 3 Encounters:  11/05/20 127/86  06/27/20 (!) 144/83  01/03/20 (!) 145/87   Current Barriers: Patient with history of hypertension, severe obstructive sleep apnea, osteoarthritis of  right hip, and obesity who is home and bed bound. Patient unable to attend doctor visits at this time due to not having appropriate transportation assistance to get out of his home.   . Knowledge Deficits related to basic understanding of hypertension pathophysiology and self care management . Transportation barriers . Not attend all scheduled provider appointments . Unable to perform ADLs independently: Patient currently has PCS services 2 1/2 hours a day, 7 days a week. Patient states he would like to increase the hours he is receiving for these services. Patient was advised by the social worker to contact his case worker with DSS to apply for increase hours.  Case Manager Clinical Goal(s):  . patient will demonstrate improved adherence to prescribed treatment plan for hypertension as evidenced by taking all medications as prescribed, monitoring and recording blood pressure as directed, adhering to low sodium/DASH diet . patient will verbalize basic understanding of hypertension disease process and self health management plan. Interventions:  . Collaboration with [name], MD regarding development and update of comprehensive plan of care as evidenced by provider attestation and co-signature . Inter-disciplinary care team collaboration (see longitudinal plan of care):  Patient currently working with social worker for stretcher transportation assistance to attend provider visits.  Marland Kitchen  Evaluation of current treatment plan related to hypertension self management and patient's adherence to plan as established by provider. . Provided education to patient re: stroke prevention, s/s of heart attack and stroke, DASH diet, complications of uncontrolled blood pressure . Reviewed medications with patient and discussed importance of compliance . Discussed plans with patient for ongoing care management follow up and provided patient with direct contact information for care management team . Advised patient,  providing education and rationale, to monitor blood pressure daily and record, calling PCP for findings outside established parameters.  . Reviewed scheduled/upcoming provider appointments . Discussed importance of using CPAP machine for sleep apnea Self-Care Activities: - Self administers medications as prescribed Calls provider office for new concerns, questions, or BP outside discussed parameters Checks BP and records as discussed Follows a low sodium diet/DASH diet Patient Goals: - check blood pressure 3 times per week - write blood pressure results in a log or diary - Follow low sodium/ DASH diet.  - Take your medications as prescribed.  - Attend doctor visits whether virtually and/ or in person (when transportation needs are arranged).  - Use CPAP as recommended by your doctor.  Follow Up Plan: The patient has been provided with contact information for the care management team and has been advised to call with any health related questions or concerns.  The care management team will reach out to the patient again over the next 30 days.

## 2021-02-15 DIAGNOSIS — R269 Unspecified abnormalities of gait and mobility: Secondary | ICD-10-CM | POA: Diagnosis not present

## 2021-02-15 DIAGNOSIS — M1631 Unilateral osteoarthritis resulting from hip dysplasia, right hip: Secondary | ICD-10-CM | POA: Diagnosis not present

## 2021-02-16 ENCOUNTER — Ambulatory Visit: Payer: Medicare HMO | Admitting: *Deleted

## 2021-02-16 ENCOUNTER — Telehealth: Payer: Self-pay | Admitting: *Deleted

## 2021-02-16 DIAGNOSIS — M1611 Unilateral primary osteoarthritis, right hip: Secondary | ICD-10-CM | POA: Diagnosis not present

## 2021-02-16 DIAGNOSIS — G4733 Obstructive sleep apnea (adult) (pediatric): Secondary | ICD-10-CM | POA: Diagnosis not present

## 2021-02-16 DIAGNOSIS — M150 Primary generalized (osteo)arthritis: Secondary | ICD-10-CM | POA: Diagnosis not present

## 2021-02-16 DIAGNOSIS — G8929 Other chronic pain: Secondary | ICD-10-CM

## 2021-02-16 DIAGNOSIS — I10 Essential (primary) hypertension: Secondary | ICD-10-CM | POA: Diagnosis not present

## 2021-02-16 DIAGNOSIS — Z7401 Bed confinement status: Secondary | ICD-10-CM

## 2021-02-16 DIAGNOSIS — K219 Gastro-esophageal reflux disease without esophagitis: Secondary | ICD-10-CM | POA: Diagnosis not present

## 2021-02-16 DIAGNOSIS — D649 Anemia, unspecified: Secondary | ICD-10-CM | POA: Diagnosis not present

## 2021-02-16 DIAGNOSIS — Z6841 Body Mass Index (BMI) 40.0 and over, adult: Secondary | ICD-10-CM | POA: Diagnosis not present

## 2021-02-16 DIAGNOSIS — R262 Difficulty in walking, not elsewhere classified: Secondary | ICD-10-CM

## 2021-02-16 DIAGNOSIS — Z9181 History of falling: Secondary | ICD-10-CM | POA: Diagnosis not present

## 2021-02-16 DIAGNOSIS — Z87891 Personal history of nicotine dependence: Secondary | ICD-10-CM | POA: Diagnosis not present

## 2021-02-16 NOTE — Telephone Encounter (Signed)
CSW working with [employer] rep to seek EMS auth for PCP visit next week. CSW called pt to update.    Reece Levy, MSW, LCSW Clinical Social Worker  [employer] [phone]

## 2021-02-17 NOTE — Telephone Encounter (Signed)
Left message for Michael Payne to call back. Need to know what they need from [place] in order for patient to get this service.

## 2021-02-18 DIAGNOSIS — M150 Primary generalized (osteo)arthritis: Secondary | ICD-10-CM | POA: Diagnosis not present

## 2021-02-18 DIAGNOSIS — G4733 Obstructive sleep apnea (adult) (pediatric): Secondary | ICD-10-CM | POA: Diagnosis not present

## 2021-02-18 DIAGNOSIS — D649 Anemia, unspecified: Secondary | ICD-10-CM | POA: Diagnosis not present

## 2021-02-18 DIAGNOSIS — Z6841 Body Mass Index (BMI) 40.0 and over, adult: Secondary | ICD-10-CM | POA: Diagnosis not present

## 2021-02-18 DIAGNOSIS — I10 Essential (primary) hypertension: Secondary | ICD-10-CM | POA: Diagnosis not present

## 2021-02-18 DIAGNOSIS — Z87891 Personal history of nicotine dependence: Secondary | ICD-10-CM | POA: Diagnosis not present

## 2021-02-18 DIAGNOSIS — Z9181 History of falling: Secondary | ICD-10-CM | POA: Diagnosis not present

## 2021-02-18 DIAGNOSIS — K219 Gastro-esophageal reflux disease without esophagitis: Secondary | ICD-10-CM | POA: Diagnosis not present

## 2021-02-18 NOTE — Telephone Encounter (Signed)
Reece Levy social worker with [facility] [employer] ins needs clinical info with office visits to support medical need for non emergent ambulance ride to [facility] for in office appt on 02/23/21. Marylu Lund said would be helpful if a letter was included by Dr Para March with specifics of medical necessity for pt to be seen in person. Use ref # [id number] and fax to [facility] ins [phone]. Marylu Lund request cb with update. Sending note to Wendie Simmer CMA.

## 2021-02-18 NOTE — Chronic Care Management (AMB) (Signed)
Chronic Care Management    Clinical Social Work Note  02/18/2021 Name: Michael Payne MRN: [id number] DOB: [birth year]  Michael Payne is a 50 y.o. year old male who is a primary care patient of Joaquim Nam, MD. The CCM team was consulted to assist the patient with chronic disease management and/or care coordination needs related to: Transportation Needs .   Engaged with patient by telephone for follow up visit in response to provider referral for social work chronic care management and care coordination services.   Consent to Services:  The patient was given information about Chronic Care Management services, agreed to services, and gave verbal consent prior to initiation of services.  Please see initial visit note for detailed documentation.   Patient agreed to services and consent obtained.   Assessment: Review of patient past medical history, allergies, medications, and health status, including review of relevant consultants reports was performed today as part of a comprehensive evaluation and provision of chronic care management and care coordination services.     SDOH (Social Determinants of Health) assessments and interventions performed:    Advanced Directives Status: Not addressed in this encounter.  CCM Care Plan  Allergies  Allergen Reactions  . Aspirin Other (See Comments)    Upset stomach  . Lactose Intolerance (Gi)     Outpatient Encounter Medications as of 02/16/2021  Medication Sig Note  . allopurinol (ZYLOPRIM) 300 MG tablet TAKE 1 TABLET TWICE DAILY   . amLODipine (NORVASC) 10 MG tablet Take 1 tablet (10 mg total) by mouth daily.   Marland Kitchen COLCRYS 0.6 MG tablet TAKE 1 TABLET BY MOUTH DAILY AS NEEDED 02/11/2021: Patient states he is taking 2 times per day  . diclofenac (VOLTAREN) 75 MG EC tablet TAKE 1 TABLET BY MOUTH 2 TIMES DAILY AS NEEDED. TAKE WITH FOOD.   Marland Kitchen lidocaine (LIDODERM) 5 % PLACE 1 PATCH ONTO THE SKIN DAILY. REMOVE & DISCARD PATCH WITHIN 12 HOURS   .  naloxone (NARCAN) nasal Payne 4 mg/0.1 mL Payne nostril if needed for opioid overuse- decreased consciousness, decreased respirations   . oxyCODONE-acetaminophen (PERCOCET) 10-325 MG tablet Take 1 tablet by mouth every 6 (six) hours as needed for pain.   Marland Kitchen tiZANidine (ZANAFLEX) 4 MG tablet Take 1 tablet (4 mg total) by mouth every 8 (eight) hours as needed for muscle spasms.   . vitamin C (ASCORBIC ACID) 500 MG tablet Take 500 mg by mouth daily.    No facility-administered encounter medications on file as of 02/16/2021.    Patient Active Problem List   Diagnosis Date Noted  . Muscle spasms of both lower extremities 07/29/2020  . Bedbound 05/22/2020  . Fall 12/27/2019  . Unilateral osteoarthritis of hip, right 12/27/2019  . Severe obstructive sleep apnea 02/22/2018  . Hypertriglyceridemia 08/24/2017  . Low HDL (under 40) 08/24/2017  . Metabolic syndrome 08/24/2017  . Morbid obesity (HCC) 10/14/2013  . HTN (hypertension) 10/14/2013  . Chest pain 07/26/2012    Conditions to be addressed/monitored: bedbound; Transportation  Care Plan : Chronic pain  Updates made by Buck Mam, LCSW since 02/18/2021 12:00 AM    Problem: Decreased mobility due to osteoarthritis pain and obesity   Priority: High  Onset Date: 02/11/2021    Long-Range Goal: Patient will perform physical activity within limits of disease   Start Date: 02/11/2021  Expected End Date: 05/19/2021  This Visit's Progress: On track  Recent Progress: On track  Priority: High  Note:   Current Barriers: 50 year old  with history of osteoarthritis of right hip, obesity and chronic pain that is bed and home bound.  Patient unable to attend doctor visits at this time due to not having appropriate transportation assistance to get out of his home.   . Knowledge Deficits related to self-health management of chronic pain . Chronic Disease Management support and education needs related to chronic pain . Transportation barriers:   Patient currently working with Teacher, English as a foreign language transportation assistance to attend provider visits.  . Not attend all scheduled provider appointments . Unable to perform ADLs independently: Patient currently has PCS services 2 1/2 hours a day, 7 days a week. Patient states he would like to increase the hours he is receiving for these services. Patient was advised by the social worker to contact his case worker with DSS to apply for increase hours.  . Unable to perform IADLs independently. Patient wife is caregiver.  Clinical Goal(s):  . patient will verbalize understanding of plan for pain management. . Patient will demonstrate use of different relaxation  skills and/or diversional activities to assist with pain reduction (distraction, imagery, relaxation, massage, acupressure, TENS, heat, and cold application.,  . patient will report pain at a level less than 3 to 4 on a 10-10 rating scale., patient will use pharmacological and nonpharmacological pain relief strategies as prescribed. . patient will verbalize acceptable level of pain relief and ability to engage in desired activities . Patient will work on healthy eating for weight loss Interventions:  CSW called pt to update on the conversations and attempts/ progress with [employer] to get EMS auth for PCP visit next week- Per Francine Graven, [facility] is out of network and will require PCP office requesting a "network waiver auth".  CSW has called [employer] and will request PCP office fax the specifics Francine Graven is requesting come from PCP office for determination.   PPI#[id number]   Pt asking CSW about getting a hoyer lift for home use; states he is sitting up  . Collaboration with Joaquim Nam, MD regarding development and update of comprehensive plan of care as evidenced by provider attestation and co-signature . Pain assessment performed . Discussed plans with patient for ongoing care management follow up and provided patient with direct contact  information for care management team . Reviewed medications with patient and discussed  . Provided patient with educational materials related to chronic pain management . Reviewed scheduled/upcoming provider appointments   . Notified social worker patient would like counseling resources for coping with chronic pain. . Due to bed bound status, advised patient to turn frequently to prevent skin breakdown.  Discussed skin breakdown signs/ symptoms.  . Provided patient with educational material related to healthy food choices.   Patient Goals/Self Care Activities:  . Take your medications as prescribed.  . Attend doctors visits as recommended.  (virtually and/ or in person when able) . Refill your medications timely . Call your provider office for new concerns or questions . Turn frequently in bed to prevent skin breakdown. Marland Kitchen Be aware of signs of skin break down: redness and skin tears.  Report this to your doctor . Continue to work with home health physical therapy. Perform home exercises as instructed by your physical therapist.  . Apply hot or cold pack for pain relief to affected area . Use assistive devices as recommended by your provider and physical therapist . Continue to work with social worker regarding transportation issues.  . Incorporate healthy food choices to assist with weight loss.  Follow Up  Plan: The patient has been provided with contact information for the care management team and has been advised to call with any health related questions or concerns.  The care management team will reach out to the patient again over the next 30 days.            Follow Up Plan: SW will follow up with patient by phone over the next days as updates received       Reece Levy MSW, LCSW Licensed Clinical Social Worker [facility] [place] [phone]

## 2021-02-18 NOTE — Patient Instructions (Signed)
Visit Information  PATIENT GOALS: Goals Addressed              This Visit's Progress   .  Need help to get out of house and to Dr Lianne Bushy (pt-stated)        Timeframe:  Long-Range Goal Priority:  High Start Date:       01/25/2021                      Expected End Date:  03/18/2021                     Follow Up Date 02/19/2021   -contact DSS/Medicaid worker to request CAPS services -complete PCS application to request additional hours of care in home -continue working with Home Health PT -expect outreach call from our team Care Guide to provide resources for ramps and other - begin a notebook of services in my neighborhood or community - call 211 when I need some help - follow-up on any referrals for help I am given - think ahead to make sure my need does not become an emergency - make a list of family or friends that I can call    Why is this important?    Knowing how and where to find help for yourself or family in your neighborhood and community is an important skill.   You will want to take some steps to learn how.    Notes:        The patient verbalized understanding of instructions, educational materials, and care plan provided today and declined offer to receive copy of patient instructions, educational materials, and care plan.   Telephone follow up appointment with care management team member scheduled for:  02/19/2021 Reece Levy MSW, LCSW Licensed Clinical Social Worker [facility] [place] [phone]

## 2021-02-19 NOTE — Telephone Encounter (Signed)
Patient called and wanted an update on the status of transportation prior to his appointment on Tuesday (6/7). This RN instructed patient that Shanda Bumps, CMA did send the letter to [facility] this morning and was working on it. Instructed patient that we would call as soon as we heard from [facility]. Patient verbalized understanding.

## 2021-02-19 NOTE — Telephone Encounter (Signed)
Letter done.  Please send with most recent visit note.  Thanks.

## 2021-02-19 NOTE — Telephone Encounter (Signed)
Notes, letter and Demographics have been sent to [facility] at [phone].

## 2021-02-23 ENCOUNTER — Ambulatory Visit: Payer: Medicare HMO | Admitting: Family Medicine

## 2021-02-23 DIAGNOSIS — Z6841 Body Mass Index (BMI) 40.0 and over, adult: Secondary | ICD-10-CM | POA: Diagnosis not present

## 2021-02-23 DIAGNOSIS — Z9181 History of falling: Secondary | ICD-10-CM | POA: Diagnosis not present

## 2021-02-23 DIAGNOSIS — Z87891 Personal history of nicotine dependence: Secondary | ICD-10-CM | POA: Diagnosis not present

## 2021-02-23 DIAGNOSIS — M150 Primary generalized (osteo)arthritis: Secondary | ICD-10-CM | POA: Diagnosis not present

## 2021-02-23 DIAGNOSIS — G4733 Obstructive sleep apnea (adult) (pediatric): Secondary | ICD-10-CM | POA: Diagnosis not present

## 2021-02-23 DIAGNOSIS — I10 Essential (primary) hypertension: Secondary | ICD-10-CM | POA: Diagnosis not present

## 2021-02-23 DIAGNOSIS — D649 Anemia, unspecified: Secondary | ICD-10-CM | POA: Diagnosis not present

## 2021-02-23 DIAGNOSIS — K219 Gastro-esophageal reflux disease without esophagitis: Secondary | ICD-10-CM | POA: Diagnosis not present

## 2021-02-23 NOTE — Telephone Encounter (Signed)
Called Michael Payne with [employer] and LM to see if everything was received and/or if transportation was approved or not. Patient had appt scheduled for today but was rescheduled due to not knowing if approved for pickup or not. Patient now scheduled for 03/09/21 with Dr. Para March. I also called and left a message with social worker Marylu Lund about this matter as well.

## 2021-02-23 NOTE — Telephone Encounter (Signed)
Spoke with social worker Marylu Lund about this and she is also keeping check on this. She stated that she checked yesterday and they do have everything but was told they have 14 days to make a decision and will be notified once that is done.

## 2021-02-25 ENCOUNTER — Ambulatory Visit (INDEPENDENT_AMBULATORY_CARE_PROVIDER_SITE_OTHER): Payer: Medicare HMO | Admitting: *Deleted

## 2021-02-25 ENCOUNTER — Telehealth: Payer: Self-pay | Admitting: *Deleted

## 2021-02-25 DIAGNOSIS — R262 Difficulty in walking, not elsewhere classified: Secondary | ICD-10-CM

## 2021-02-25 DIAGNOSIS — Z87891 Personal history of nicotine dependence: Secondary | ICD-10-CM | POA: Diagnosis not present

## 2021-02-25 DIAGNOSIS — Z7401 Bed confinement status: Secondary | ICD-10-CM

## 2021-02-25 DIAGNOSIS — I10 Essential (primary) hypertension: Secondary | ICD-10-CM

## 2021-02-25 DIAGNOSIS — K219 Gastro-esophageal reflux disease without esophagitis: Secondary | ICD-10-CM | POA: Diagnosis not present

## 2021-02-25 DIAGNOSIS — Z9181 History of falling: Secondary | ICD-10-CM | POA: Diagnosis not present

## 2021-02-25 DIAGNOSIS — Z6841 Body Mass Index (BMI) 40.0 and over, adult: Secondary | ICD-10-CM | POA: Diagnosis not present

## 2021-02-25 DIAGNOSIS — D649 Anemia, unspecified: Secondary | ICD-10-CM | POA: Diagnosis not present

## 2021-02-25 DIAGNOSIS — G4733 Obstructive sleep apnea (adult) (pediatric): Secondary | ICD-10-CM | POA: Diagnosis not present

## 2021-02-25 DIAGNOSIS — M150 Primary generalized (osteo)arthritis: Secondary | ICD-10-CM | POA: Diagnosis not present

## 2021-03-01 DIAGNOSIS — I10 Essential (primary) hypertension: Secondary | ICD-10-CM | POA: Diagnosis not present

## 2021-03-01 DIAGNOSIS — G4733 Obstructive sleep apnea (adult) (pediatric): Secondary | ICD-10-CM | POA: Diagnosis not present

## 2021-03-01 DIAGNOSIS — D649 Anemia, unspecified: Secondary | ICD-10-CM | POA: Diagnosis not present

## 2021-03-01 DIAGNOSIS — K219 Gastro-esophageal reflux disease without esophagitis: Secondary | ICD-10-CM | POA: Diagnosis not present

## 2021-03-01 DIAGNOSIS — M150 Primary generalized (osteo)arthritis: Secondary | ICD-10-CM | POA: Diagnosis not present

## 2021-03-01 DIAGNOSIS — Z87891 Personal history of nicotine dependence: Secondary | ICD-10-CM | POA: Diagnosis not present

## 2021-03-01 DIAGNOSIS — Z9181 History of falling: Secondary | ICD-10-CM | POA: Diagnosis not present

## 2021-03-01 DIAGNOSIS — Z6841 Body Mass Index (BMI) 40.0 and over, adult: Secondary | ICD-10-CM | POA: Diagnosis not present

## 2021-03-01 NOTE — Telephone Encounter (Signed)
Firefighter received for EMS transport.Pt advised. Will communicated to PCP and coordinate for PCP visit 03/09/2021.

## 2021-03-01 NOTE — Patient Instructions (Signed)
Visit Information  PATIENT GOALS:

## 2021-03-01 NOTE — Chronic Care Management (AMB) (Addendum)
  Chronic Care Management    Clinical Social Work Note  03/01/2021 Name: Michael Payne MRN: [id number] DOB: 1972-09-20Transportation Needs  and Community Resources   Michael Payne is a 50 y.o. year old male who is a primary care patient of Joaquim Nam, MD. The CCM team was consulted to assist the patient with chronic disease management and/or care coordination needs related to: Transportation Needs .   Engaged with patient by telephone for follow up visit in response to provider referral for social work chronic care management and care coordination services.   Consent to Services:  The patient was given information about Chronic Care Management services, agreed to services, and gave verbal consent prior to initiation of services.  Please see initial visit note for detailed documentation.   Patient agreed to services and consent obtained.   Assessment: Review of patient past medical history, allergies, medications, and health status, including review of relevant consultants reports was performed today as part of a comprehensive evaluation and provision of chronic care management and care coordination services.     SDOH (Social Determinants of Health) assessments and interventions performed:    Advanced Directives Status: Not addressed in this encounter.  CCM Care Plan  Allergies  Allergen Reactions   Aspirin Other (See Comments)    Upset stomach   Lactose Intolerance (Gi)     Outpatient Encounter Medications as of 02/25/2021  Medication Sig Note   allopurinol (ZYLOPRIM) 300 MG tablet TAKE 1 TABLET TWICE DAILY    amLODipine (NORVASC) 10 MG tablet Take 1 tablet (10 mg total) by mouth daily.    COLCRYS 0.6 MG tablet TAKE 1 TABLET BY MOUTH DAILY AS NEEDED 02/11/2021: Patient states he is taking 2 times per day   diclofenac (VOLTAREN) 75 MG EC tablet TAKE 1 TABLET BY MOUTH 2 TIMES DAILY AS NEEDED. TAKE WITH FOOD.    lidocaine (LIDODERM) 5 % PLACE 1 PATCH ONTO THE SKIN DAILY. REMOVE &  DISCARD PATCH WITHIN 12 HOURS    naloxone (NARCAN) nasal spray 4 mg/0.1 mL Spray nostril if needed for opioid overuse- decreased consciousness, decreased respirations    oxyCODONE-acetaminophen (PERCOCET) 10-325 MG tablet Take 1 tablet by mouth every 6 (six) hours as needed for pain.    tiZANidine (ZANAFLEX) 4 MG tablet Take 1 tablet (4 mg total) by mouth every 8 (eight) hours as needed for muscle spasms.    vitamin C (ASCORBIC ACID) 500 MG tablet Take 500 mg by mouth daily.    No facility-administered encounter medications on file as of 02/25/2021.    Patient Active Problem List   Diagnosis Date Noted   Muscle spasms of both lower extremities 07/29/2020   Bedbound 05/22/2020   Fall 12/27/2019   Unilateral osteoarthritis of hip, right 12/27/2019   Severe obstructive sleep apnea 02/22/2018   Hypertriglyceridemia 08/24/2017   Low HDL (under 40) 08/24/2017   Metabolic syndrome 08/24/2017   Morbid obesity (HCC) 10/14/2013   HTN (hypertension) 10/14/2013   Chest pain 07/26/2012    Conditions to be addressed/monitored: HTN and bedbound  ; Transportation  There are no care plans that you recently modified to display for this patient.    Follow Up Plan: SW will follow up with patient by phone over the next week to coordinate EMS ride to PCP on 03/09/2021      Reece Levy MSW, LCSW Licensed Clinical Social Worker [facility] [place] [phone]

## 2021-03-06 DIAGNOSIS — I10 Essential (primary) hypertension: Secondary | ICD-10-CM | POA: Diagnosis not present

## 2021-03-06 DIAGNOSIS — M150 Primary generalized (osteo)arthritis: Secondary | ICD-10-CM | POA: Diagnosis not present

## 2021-03-06 DIAGNOSIS — Z6841 Body Mass Index (BMI) 40.0 and over, adult: Secondary | ICD-10-CM | POA: Diagnosis not present

## 2021-03-06 DIAGNOSIS — K219 Gastro-esophageal reflux disease without esophagitis: Secondary | ICD-10-CM | POA: Diagnosis not present

## 2021-03-06 DIAGNOSIS — D649 Anemia, unspecified: Secondary | ICD-10-CM | POA: Diagnosis not present

## 2021-03-06 DIAGNOSIS — Z9181 History of falling: Secondary | ICD-10-CM | POA: Diagnosis not present

## 2021-03-06 DIAGNOSIS — G4733 Obstructive sleep apnea (adult) (pediatric): Secondary | ICD-10-CM | POA: Diagnosis not present

## 2021-03-06 DIAGNOSIS — Z87891 Personal history of nicotine dependence: Secondary | ICD-10-CM | POA: Diagnosis not present

## 2021-03-08 ENCOUNTER — Ambulatory Visit: Payer: Medicare HMO | Admitting: *Deleted

## 2021-03-08 ENCOUNTER — Telehealth: Payer: Self-pay | Admitting: *Deleted

## 2021-03-08 DIAGNOSIS — G4733 Obstructive sleep apnea (adult) (pediatric): Secondary | ICD-10-CM

## 2021-03-08 DIAGNOSIS — I10 Essential (primary) hypertension: Secondary | ICD-10-CM

## 2021-03-08 DIAGNOSIS — R262 Difficulty in walking, not elsewhere classified: Secondary | ICD-10-CM

## 2021-03-08 DIAGNOSIS — Z7401 Bed confinement status: Secondary | ICD-10-CM

## 2021-03-08 NOTE — Chronic Care Management (AMB) (Signed)
Chronic Care Management    Clinical Social Work Note  03/08/2021 Name: Michael Payne MRN: [id number] DOB: Feb 08, 1971  Michael Payne is a 50 y.o. year old male who is a primary care patient of Joaquim Nam, MD. The CCM team was consulted to assist the patient with chronic disease management and/or care coordination needs related to: Transportation Needs .   Engaged with patient by telephone for follow up visit in response to provider referral for social work chronic care management and care coordination services.   Consent to Services:  The patient was given information about Chronic Care Management services, agreed to services, and gave verbal consent prior to initiation of services.  Please see initial visit note for detailed documentation.   Patient agreed to services and consent obtained.   Assessment: Review of patient past medical history, allergies, medications, and health status, including review of relevant consultants reports was performed today as part of a comprehensive evaluation and provision of chronic care management and care coordination services.     SDOH (Social Determinants of Health) assessments and interventions performed:    Advanced Directives Status: Not addressed in this encounter.  CCM Care Plan  Allergies  Allergen Reactions   Aspirin Other (See Comments)    Upset stomach   Lactose Intolerance (Gi)     Outpatient Encounter Medications as of 03/08/2021  Medication Sig Note   allopurinol (ZYLOPRIM) 300 MG tablet TAKE 1 TABLET TWICE DAILY    amLODipine (NORVASC) 10 MG tablet Take 1 tablet (10 mg total) by mouth daily.    COLCRYS 0.6 MG tablet TAKE 1 TABLET BY MOUTH DAILY AS NEEDED 02/11/2021: Patient states he is taking 2 times per day   diclofenac (VOLTAREN) 75 MG EC tablet TAKE 1 TABLET BY MOUTH 2 TIMES DAILY AS NEEDED. TAKE WITH FOOD.    lidocaine (LIDODERM) 5 % PLACE 1 PATCH ONTO THE SKIN DAILY. REMOVE & DISCARD PATCH WITHIN 12 HOURS    naloxone  (NARCAN) nasal spray 4 mg/0.1 mL Spray nostril if needed for opioid overuse- decreased consciousness, decreased respirations    oxyCODONE-acetaminophen (PERCOCET) 10-325 MG tablet Take 1 tablet by mouth every 6 (six) hours as needed for pain.    tiZANidine (ZANAFLEX) 4 MG tablet Take 1 tablet (4 mg total) by mouth every 8 (eight) hours as needed for muscle spasms.    vitamin C (ASCORBIC ACID) 500 MG tablet Take 500 mg by mouth daily.    No facility-administered encounter medications on file as of 03/08/2021.    Patient Active Problem List   Diagnosis Date Noted   Muscle spasms of both lower extremities 07/29/2020   Bedbound 05/22/2020   Fall 12/27/2019   Unilateral osteoarthritis of hip, right 12/27/2019   Severe obstructive sleep apnea 02/22/2018   Hypertriglyceridemia 08/24/2017   Low HDL (under 40) 08/24/2017   Metabolic syndrome 08/24/2017   Morbid obesity (HCC) 10/14/2013   HTN (hypertension) 10/14/2013   Chest pain 07/26/2012    Conditions to be addressed/monitored: Atrial Fibrillation, DMII, and bariatric needs ; Transportation  Care Plan : Development of Plan for Management of Functional Limitations and Needs  Updates made by Buck Mam, LCSW since 03/08/2021 12:00 AM     Problem: Social and Functional Symptoms      Long-Range Goal: Optimize pt's quality of life and opportunities   Start Date: 01/25/2021  This Visit's Progress: On track  Recent Progress: On track  Priority: High  Note:   Current barriers:   Transportation, Housing barriers,  Level of care concerns, ADL IADL limitations, Social Isolation, Limited access to caregiver, Inability to perform ADL's independently, Inability to perform IADL's independently, and Lacks knowledge of community resource Clinical Goals: Patient will work with CSW  to address needs related to transportation, PCS/CAPS and other needs as assessed and identified Clinical Interventions:  CSW spoke with pt by phone and confirmed  coordination of [facility] transport arrangements set for tomorrow for his non-emergency transport to PCP office. Pt pleased and appreciative. PCP office visit scheduled for 03/09/21. CSW will remind PCP staff of his stretcher-bound status for appointment. Pt instructed to do as he has done in past with calling fire station to request their help with getting out of bed to stretcher and down steps to EMS truck.   Collaboration with Joaquim Nam, MD regarding development and update of comprehensive plan of care as evidenced by provider attestation and co-signature Inter-disciplinary care team collaboration (see longitudinal plan of care) Assessment of needs, barriers , agencies contacted, as well as how impacting  Review various resources, discussed options and provided patient information about Department of Social Services (food stamps, Medicaid, and utilities assistance), Transportation provided by insurance provider, Enhanced Benefits connected with insurance provider, Referral to care guide for community support and other options, SCAT transportation, and [employer] Alternative Program CAP Patient interviewed and appropriate assessments performed Referred patient to community resources care guide team for assistance with ramps (building/ex[expense,etc)  Provided mental health counseling with regard to pt's current isolation and being homebound/bedbound Provided patient with information about SCAT, CAPS, transportation resources Discussed plans with patient for ongoing care management follow up and provided patient with direct contact information for care management team Advised patient to call DSS worker to request CAPS services Advised patient to complete PCS application/request for increase in hours provided Collaborated with primary care provider re: insurance prior auth process for stretcher transportation Assisted patient/caregiver with obtaining information about health plan benefits Other  interventions provided:Solution-Focused Strategies, Active listening / Reflection utilized , Emotional Supportive Provided, Problem Solving /Task Center , Psychoeducation /Health Education, Motivational Interviewing, Brief CBT , Increase in activities / exercise encouraged (with HHPT), and Provided basic mental health support, education   Patient Goals/Self-Care Activities: Over the next 60 days  Follow Up Date 03/15/2021   -coordinate with fire station for assistance in advance for PCP visit scheduled for 03/09/21 -contact DSS/Medicaid worker to request CAPS services -complete PCS application to request additional hours of care in home -continue working with Home Health PT -expect outreach call from our team Care Guide to provide resources for ramps and other - begin a notebook of services in my neighborhood or community - call 211 when I need some help - follow-up on any referrals for help I am given - think ahead to make sure my need does not become an emergency - make a list of family or friends that I can call        Follow Up Plan: Appointment scheduled for SW follow up with client by phone on: 03/15/2021      Reece Levy MSW, LCSW Licensed Clinical Social Worker [facility] [place] [phone]

## 2021-03-08 NOTE — Patient Instructions (Signed)
Visit Information  PATIENT GOALS:  Goals Addressed               This Visit's Progress     Need help to get out of house and to Dr Lianne Bushy (pt-stated)        Timeframe:  Long-Range Goal Priority:  High Start Date:       01/25/2021                      Expected End Date:  03/18/2021                     Follow Up Date 03/15/2021   -expect [facility] arrival at 10am 03/09/2021 for PCP visit scheduled for at 11:30 03/09/21 -contact DSS/Medicaid worker to request CAPS services -complete PCS application to request additional hours of care in home -continue working with Home Health PT -expect outreach call from our team Care Guide to provide resources for ramps and other - begin a notebook of services in my neighborhood or community - call 211 when I need some help - follow-up on any referrals for help I am given - think ahead to make sure my need does not become an emergency - make a list of family or friends that I can call    Why is this important?   Knowing how and where to find help for yourself or family in your neighborhood and community is an important skill.  You will want to take some steps to learn how.    Notes:          The patient verbalized understanding of instructions, educational materials, and care plan provided today and declined offer to receive copy of patient instructions, educational materials, and care plan.   Telephone follow up appointment with care management team member scheduled for: 03/15/2021  Reece Levy MSW, LCSW Licensed Clinical Social Worker [facility] [place] [phone]

## 2021-03-08 NOTE — Telephone Encounter (Signed)
CSW spoke with Gunnar Fusi at [facility] and confirmed plans for EMS transport.   Reece Levy MSW, LCSW Licensed Clinical Social Worker [facility] [place] [phone]

## 2021-03-09 ENCOUNTER — Ambulatory Visit (INDEPENDENT_AMBULATORY_CARE_PROVIDER_SITE_OTHER): Payer: Medicare HMO | Admitting: Family Medicine

## 2021-03-09 ENCOUNTER — Encounter: Payer: Self-pay | Admitting: Family Medicine

## 2021-03-09 ENCOUNTER — Other Ambulatory Visit: Payer: Self-pay

## 2021-03-09 VITALS — BP 160/119 | HR 88 | Temp 97.3°F

## 2021-03-09 DIAGNOSIS — G4733 Obstructive sleep apnea (adult) (pediatric): Secondary | ICD-10-CM | POA: Diagnosis not present

## 2021-03-09 DIAGNOSIS — R0602 Shortness of breath: Secondary | ICD-10-CM | POA: Diagnosis not present

## 2021-03-09 DIAGNOSIS — R739 Hyperglycemia, unspecified: Secondary | ICD-10-CM | POA: Diagnosis not present

## 2021-03-09 DIAGNOSIS — R609 Edema, unspecified: Secondary | ICD-10-CM | POA: Diagnosis not present

## 2021-03-09 DIAGNOSIS — M1611 Unilateral primary osteoarthritis, right hip: Secondary | ICD-10-CM

## 2021-03-09 DIAGNOSIS — R0902 Hypoxemia: Secondary | ICD-10-CM | POA: Diagnosis not present

## 2021-03-09 DIAGNOSIS — M109 Gout, unspecified: Secondary | ICD-10-CM

## 2021-03-09 LAB — LIPID PANEL
Cholesterol: 165 mg/dL (ref 0–200)
HDL: 38.5 mg/dL — ABNORMAL LOW (ref >39.00)
LDL Cholesterol: 101 mg/dL — ABNORMAL HIGH (ref 0–99)
NonHDL: 126.48
Total CHOL/HDL Ratio: 4
Triglycerides: 129 mg/dL (ref 0.0–149.0)
VLDL: 25.8 mg/dL (ref 0.0–40.0)

## 2021-03-09 LAB — COMPREHENSIVE METABOLIC PANEL
ALT: 29 U/L (ref 0–53)
AST: 25 U/L (ref 0–37)
Albumin: 3.9 g/dL (ref 3.5–5.2)
Alkaline Phosphatase: 62 U/L (ref 39–117)
BUN: 10 mg/dL (ref 6–23)
CO2: 35 mEq/L — ABNORMAL HIGH (ref 19–32)
Calcium: 9.1 mg/dL (ref 8.4–10.5)
Chloride: 97 mEq/L (ref 96–112)
Creatinine, Ser: 0.68 mg/dL (ref 0.40–1.50)
GFR: 109.18 mL/min (ref >60.00)
Glucose, Bld: 93 mg/dL (ref 70–99)
Potassium: 4.5 mEq/L (ref 3.5–5.1)
Sodium: 139 mEq/L (ref 135–145)
Total Bilirubin: 0.4 mg/dL (ref 0.2–1.2)
Total Protein: 7.3 g/dL (ref 6.0–8.3)

## 2021-03-09 LAB — CBC WITH DIFFERENTIAL/PLATELET
Basophils Absolute: 0 10*3/uL (ref 0.0–0.1)
Basophils Relative: 0.5 % (ref 0.0–3.0)
Eosinophils Absolute: 0.2 10*3/uL (ref 0.0–0.7)
Eosinophils Relative: 2.4 % (ref 0.0–5.0)
HCT: 44.6 % (ref 39.0–52.0)
Hemoglobin: 14.2 g/dL (ref 13.0–17.0)
Lymphocytes Relative: 17.2 % (ref 12.0–46.0)
Lymphs Abs: 1.4 10*3/uL (ref 0.7–4.0)
MCHC: 31.9 g/dL (ref 30.0–36.0)
MCV: 92.7 fl (ref 78.0–100.0)
Monocytes Absolute: 0.6 10*3/uL (ref 0.1–1.0)
Monocytes Relative: 6.9 % (ref 3.0–12.0)
Neutro Abs: 6.1 10*3/uL (ref 1.4–7.7)
Neutrophils Relative %: 73 % (ref 43.0–77.0)
Platelets: 261 10*3/uL (ref 150.0–400.0)
RBC: 4.81 Mil/uL (ref 4.22–5.81)
RDW: 15.3 % (ref 11.5–15.5)
WBC: 8.4 10*3/uL (ref 4.0–10.5)

## 2021-03-09 LAB — BRAIN NATRIURETIC PEPTIDE: Pro B Natriuretic peptide (BNP): 23 pg/mL (ref 0.0–100.0)

## 2021-03-09 LAB — URIC ACID: Uric Acid, Serum: 4.8 mg/dL (ref 4.0–7.8)

## 2021-03-09 LAB — HEMOGLOBIN A1C: Hgb A1c MFr Bld: 6.5 % (ref 4.6–6.5)

## 2021-03-09 LAB — TSH: TSH: 4.33 u[IU]/mL (ref 0.35–4.50)

## 2021-03-09 MED ORDER — OXYCODONE-ACETAMINOPHEN 10-325 MG PO TABS: 1.0000 | ORAL_TABLET | Freq: Four times a day (QID) | ORAL | 0 refills | Status: DC | PRN

## 2021-03-09 MED ORDER — COLCHICINE 0.6 MG PO TABS: 0.6000 mg | ORAL_TABLET | Freq: Every day | ORAL | Status: DC | PRN

## 2021-03-09 NOTE — Progress Notes (Signed)
This visit occurred during the SARS-CoV-2 public health emergency.  Safety protocols were in place, including screening questions prior to the visit, additional usage of staff PPE, and extensive cleaning of exam room while observing appropriate contact time as indicated for disinfecting solutions.  In person evaluation.  Multiple issues to consider.  Still with significant right hip pain.  6/10 at the office visit.  Still using lidocaine and diclofenac.  Still in PT.  Able to sit with full assist.  Still on oxycodone.  Sleep is variable.  More pain with weather change.  Not constipated on current medications.  Other than sitting with full assist he is bedbound.  He is asked about getting a Nurse, adult at home.  Blood pressure elevated at office visit but usually controlled/lower on home checks.  History of sleep apnea.  Needs pulmonary help.  21" neck.  Mask fit is stress inducing.   I need pulmonary input, discussed with patient.  History of gout.  He had trouble paying for colchicine.  No recent flares.  Still on allopurinol.  Due for labs.  We talked about bariatric intervention.  He needs to lose weight to be able to see orthopedics about his hip.  He needs to lose weight for multiple other medical reasons and also to help with transport.  He can get short of breath with exertion.  He has left greater than right leg edema that is chronic.  This is longstanding and not acute.  No chest pain.  He has had loose stools but no black stools.  He required full assist EMS transport with bariatric equipment to get to the office visit today.  Meds, vitals, and allergies reviewed.   ROS: Per HPI unless specifically indicated in ROS section   GEN: nad, alert and oriented, on stretcher HEENT:ncat NECK: supple w/o LA, 21 inch neck circumference CV: rrr.  PULM: ctab, no inc wob ABD: soft, +bs EXT: L>R lower leg and foot edema.  SKIN: No acute rash, well perfused. Significant right hip pain with any  range of motion. He is unable to sit up on the stretcher without full assist.  He cannot get up to weight-bear.  35 minutes were devoted to patient care in this encounter (this includes time spent reviewing the patient's file/history, interviewing and examining the patient, counseling/reviewing plan with patient).

## 2021-03-09 NOTE — Patient Instructions (Signed)
We'll update you about your labs.  I'll see about getting a lift and extra help at home.  I'll check with lung clinic and the weight loss options (meds and/or general surgery). Take care.  Glad to see you.

## 2021-03-10 ENCOUNTER — Encounter: Payer: Self-pay | Admitting: Family Medicine

## 2021-03-10 DIAGNOSIS — E119 Type 2 diabetes mellitus without complications: Secondary | ICD-10-CM | POA: Insufficient documentation

## 2021-03-10 DIAGNOSIS — M109 Gout, unspecified: Secondary | ICD-10-CM | POA: Insufficient documentation

## 2021-03-10 DIAGNOSIS — Z87891 Personal history of nicotine dependence: Secondary | ICD-10-CM | POA: Diagnosis not present

## 2021-03-10 DIAGNOSIS — M150 Primary generalized (osteo)arthritis: Secondary | ICD-10-CM | POA: Diagnosis not present

## 2021-03-10 DIAGNOSIS — R609 Edema, unspecified: Secondary | ICD-10-CM | POA: Insufficient documentation

## 2021-03-10 DIAGNOSIS — D649 Anemia, unspecified: Secondary | ICD-10-CM | POA: Diagnosis not present

## 2021-03-10 DIAGNOSIS — Z6841 Body Mass Index (BMI) 40.0 and over, adult: Secondary | ICD-10-CM | POA: Diagnosis not present

## 2021-03-10 DIAGNOSIS — K219 Gastro-esophageal reflux disease without esophagitis: Secondary | ICD-10-CM | POA: Diagnosis not present

## 2021-03-10 DIAGNOSIS — G4733 Obstructive sleep apnea (adult) (pediatric): Secondary | ICD-10-CM | POA: Diagnosis not present

## 2021-03-10 DIAGNOSIS — Z9181 History of falling: Secondary | ICD-10-CM | POA: Diagnosis not present

## 2021-03-10 DIAGNOSIS — I10 Essential (primary) hypertension: Secondary | ICD-10-CM | POA: Diagnosis not present

## 2021-03-10 NOTE — Assessment & Plan Note (Signed)
Chronic.  I suspect this is related to body habitus and up needing compression stockings.  Given his shortness of breath (that I expect to be related to habitus) is still makes sense to check a BNP.  See notes on labs.  Okay for outpatient follow-up.

## 2021-03-10 NOTE — Assessment & Plan Note (Signed)
He had difficulty paying for colchicine.  Still taking allopurinol and fortunately not having recent flares.  See notes on labs.

## 2021-03-10 NOTE — Assessment & Plan Note (Signed)
Check labs today.  We need to see about options for bariatric intervention with general surgery versus weight loss options.  See notes on labs.

## 2021-03-10 NOTE — Assessment & Plan Note (Signed)
Reasonable to continue oxycodone given his current level of pain and lack of adverse effect on oxycodone.  It helps some with the pain.  We need to get his weight lower so that he will be able to follow-up with orthopedics.

## 2021-03-10 NOTE — Assessment & Plan Note (Signed)
We will see about trying to get pulmonary follow-up arranged.  Wearing a mask is stress-induced and for the patient.

## 2021-03-11 ENCOUNTER — Ambulatory Visit: Payer: Medicare HMO

## 2021-03-11 DIAGNOSIS — D649 Anemia, unspecified: Secondary | ICD-10-CM | POA: Diagnosis not present

## 2021-03-11 DIAGNOSIS — I10 Essential (primary) hypertension: Secondary | ICD-10-CM | POA: Diagnosis not present

## 2021-03-11 DIAGNOSIS — F339 Major depressive disorder, recurrent, unspecified: Secondary | ICD-10-CM | POA: Diagnosis not present

## 2021-03-11 DIAGNOSIS — M1611 Unilateral primary osteoarthritis, right hip: Secondary | ICD-10-CM

## 2021-03-11 DIAGNOSIS — G8929 Other chronic pain: Secondary | ICD-10-CM

## 2021-03-11 DIAGNOSIS — Z6841 Body Mass Index (BMI) 40.0 and over, adult: Secondary | ICD-10-CM | POA: Diagnosis not present

## 2021-03-11 DIAGNOSIS — G4733 Obstructive sleep apnea (adult) (pediatric): Secondary | ICD-10-CM

## 2021-03-11 DIAGNOSIS — M150 Primary generalized (osteo)arthritis: Secondary | ICD-10-CM | POA: Diagnosis not present

## 2021-03-11 DIAGNOSIS — E119 Type 2 diabetes mellitus without complications: Secondary | ICD-10-CM | POA: Diagnosis not present

## 2021-03-11 DIAGNOSIS — Z9181 History of falling: Secondary | ICD-10-CM | POA: Diagnosis not present

## 2021-03-11 DIAGNOSIS — M6281 Muscle weakness (generalized): Secondary | ICD-10-CM | POA: Diagnosis not present

## 2021-03-11 DIAGNOSIS — K219 Gastro-esophageal reflux disease without esophagitis: Secondary | ICD-10-CM | POA: Diagnosis not present

## 2021-03-11 DIAGNOSIS — Z87891 Personal history of nicotine dependence: Secondary | ICD-10-CM | POA: Diagnosis not present

## 2021-03-12 ENCOUNTER — Encounter: Payer: Self-pay | Admitting: Family Medicine

## 2021-03-12 NOTE — Chronic Care Management (AMB) (Signed)
Chronic Care Management   CCM RN Visit Note  03/12/2021 Name: Michael Payne MRN: [id number] DOB: 01/12/1971  Subjective: Michael Payne is a 50 y.o. year old male who is a primary care patient of Joaquim Nam, MD. The care management team was consulted for assistance with disease management and care coordination needs.    Engaged with patient by telephone for follow up visit in response to provider referral for case management and/or care coordination services.   Consent to Services:  The patient was given information about Chronic Care Management services, agreed to services, and gave verbal consent prior to initiation of services.  Please see initial visit note for detailed documentation.   Patient agreed to services and verbal consent obtained.   Assessment: Review of patient past medical history, allergies, medications, health status, including review of consultants reports, laboratory and other test data, was performed as part of comprehensive evaluation and provision of chronic care management services.   SDOH (Social Determinants of Health) assessments and interventions performed:    CCM Care Plan  Allergies  Allergen Reactions   Aspirin Other (See Comments)    Upset stomach   Lactose Intolerance (Gi)     Outpatient Encounter Medications as of 03/11/2021  Medication Sig   allopurinol (ZYLOPRIM) 300 MG tablet TAKE 1 TABLET TWICE DAILY   amLODipine (NORVASC) 10 MG tablet Take 1 tablet (10 mg total) by mouth daily.   colchicine (COLCRYS) 0.6 MG tablet Take 1 tablet (0.6 mg total) by mouth daily as needed.   diclofenac (VOLTAREN) 75 MG EC tablet TAKE 1 TABLET BY MOUTH 2 TIMES DAILY AS NEEDED. TAKE WITH FOOD.   lidocaine (LIDODERM) 5 % PLACE 1 PATCH ONTO THE SKIN DAILY. REMOVE & DISCARD PATCH WITHIN 12 HOURS   naloxone (NARCAN) nasal spray 4 mg/0.1 mL Spray nostril if needed for opioid overuse- decreased consciousness, decreased respirations   oxyCODONE-acetaminophen  (PERCOCET) 10-325 MG tablet Take 1 tablet by mouth every 6 (six) hours as needed for pain.   tiZANidine (ZANAFLEX) 4 MG tablet Take 1 tablet (4 mg total) by mouth every 8 (eight) hours as needed for muscle spasms.   vitamin C (ASCORBIC ACID) 500 MG tablet Take 500 mg by mouth daily.   No facility-administered encounter medications on file as of 03/11/2021.    Patient Active Problem List   Diagnosis Date Noted   Gout, unspecified 03/10/2021   Edema 03/10/2021   Diabetes mellitus without complication (HCC) 03/10/2021   Muscle spasms of both lower extremities 07/29/2020   Bedbound 05/22/2020   Unilateral osteoarthritis of hip, right 12/27/2019   Severe obstructive sleep apnea 02/22/2018   Hypertriglyceridemia 08/24/2017   Low HDL (under 40) 08/24/2017   Metabolic syndrome 08/24/2017   Morbid obesity (HCC) 10/14/2013   HTN (hypertension) 10/14/2013    Conditions to be addressed/monitored:HTN and Chronic pain/ osteoarthritis, obesity  Care Plan : Chronic pain due to osteoarthritis  Updates made by Otho Ket, RN since 03/12/2021 12:00 AM   Problem: Decreased mobility due to osteoarthritis pain and obesity   Priority: High   Long-Range Goal: Patient will perform physical activity within limits of disease   Start Date: 02/11/2021  Expected End Date: 05/19/2021  This Visit's Progress: On track  Recent Progress: On track  Priority: High  Current Barriers: Patient states he was able to get to his primary care provider visit on 03/09/2021 by EMS transport. Patient reports having  home physical and occupational therapy services.   He reports ongoing right  hip pain and states his pain level today is an 8.   Patient states he has swelling in his right foot.  He reports his doctor is aware. He states he is trying to lay more on his back and elevate his right foot to help with decreasing the swelling. Patient states he is able to sit on the side of the bed with assistance.  Knowledge Deficits  related to self-health management of chronic pain Chronic Disease Management support and education needs related to chronic pain Transportation barriers  Not attend all scheduled provider appointments Unable to perform ADLs independently: Patient continues with PCS services 2 1/2 hours a day, 7 days a week.  Unable to perform IADLs independently.  Clinical Goal(s):  patient will verbalize understanding of plan for pain management. Patient states he takes his pain medication as prescribed. RNCM advised patient to consider taking pain medication prior to physical and/ or occupational therapy visits.  patient will report pain at a level less than 3 to 4 on a 10-10 rating scale., patient will use pharmacological and nonpharmacological pain relief strategies as prescribed. patient will verbalize acceptable level of pain relief and ability to engage in desired activities Patient will work on healthy eating for weight loss  Interventions:  Collaboration with Joaquim Nam, MD regarding development and update of comprehensive plan of care as evidenced by provider attestation and co-signature:  Per chart review of primary care providers' 03/09/2021 note provider will look at ordering [facility] lift for patient and referring to general surgery and pulmonary.   Pain assessment performed Discussed plans with patient for ongoing care management follow up and provided patient with direct contact information for care management team Reviewed medications with patient and discussed  Reviewed scheduled/upcoming provider appointments:  Patient reports next follow up appointment with primary care provider  is 04/06/2021 Notified social worker patient would like counseling resources for coping with chronic pain. Due to bed bound status, advised patient to turn frequently to prevent skin breakdown.  Reviewed skin breakdown signs/ symptoms.  Provided patient with educational material related to healthy food choices.   Discussed with patient eating more fruits, vegetables and lean meats. Advised to cook meats by broiling, baking or roasting. Advised to drink plenty of water, read food labels and use smaller plates for portion control.  Patient Goals/Self Care Activities:  Continue to take your medications as prescribed.  Attend doctors visits as recommended.  (virtually and/ or in person when able) Refill your medications timely Call your provider office for new concerns or questions Continue to turn frequently in bed to prevent skin breakdown. Be aware of signs of skin break down: redness and skin tears.  Report this to your doctor Continue to work with home health physical therapy. Perform home exercises as instructed by your physical / occupational therapist.  Apply hot or cold pack for pain relief to affected area Use assistive devices as recommended by your provider and physical therapist Incorporate healthy food choices to assist with weight loss. Review education information on healthy diet sent to you in My chart by your RN case manager Follow Up Plan: The patient has been provided with contact information for the care management team and has been advised to call with any health related questions or concerns.  The care management team will reach out to the patient again over the next 30 days.      Care Plan : Hypertension (Adult)  Updates made by Otho Ket, RN since 03/12/2021 12:00 AM  Problem: Hypertension (Hypertension)   Priority: Medium  Onset Date: 02/11/2021   Long-Range Goal: Hypertension Monitored   Start Date: 02/11/2021  Expected End Date: 05/19/2021  This Visit's Progress: On track  Recent Progress: On track  Priority: Medium  Objective:  Last practice recorded BP readings:  BP Readings from Last 3 Encounters:  03/09/21 (!) 160/119  11/05/20 127/86  06/27/20 (!) 144/83  Current Barriers: Patient reports his blood pressure at his primary provider visit on 03/09/2021 was  elevated 160/ 119.  Patient states his blood pressures are not usually that high.  He reports today's blood pressure taken by his home physical therapist was 138/88.  Patient states he continues to take his medications as prescribed. He states his blood pressure is taken twice a week at his home physical therapy and occupational therapy visits.  Knowledge Deficits related to basic understanding of hypertension pathophysiology and self care management Transportation barriers Not attend all scheduled provider appointments Unable to perform ADLs independently: Patient continues to receive  PCS services 2 1/2 hours a day, 7 days a week.   Case Manager Clinical Goal(s):  patient will demonstrate improved adherence to prescribed treatment plan for hypertension as evidenced by taking all medications as prescribed, monitoring and recording blood pressure as directed, adhering to low sodium/DASH diet patient will verbalize basic understanding of hypertension disease process and self health management plan. Interventions:  Collaboration with Joaquim Nam, MD regarding development and update of comprehensive plan of care as evidenced by provider attestation and co-signature:    Per chart review of primary care providers' 03/09/2021 note provider will look at ordering [facility] lift for patient and referring to general surgery and pulmonary.   Inter-disciplinary care team collaboration (see longitudinal plan of care) Evaluation of current treatment plan related to hypertension self management and patient's adherence to plan as established by provider. Provided education to patient re: stroke prevention, s/s of heart attack and stroke, DASH diet, complications of uncontrolled blood pressure Reviewed medications with patient and discussed importance of compliance Discussed plans with patient for ongoing care management follow up and provided patient with direct contact information for care management team Advised  patient, providing education and rationale, to monitor blood pressure daily and record, calling PCP for findings outside established parameters.  Reviewed scheduled/upcoming provider appointments Discussed importance of using CPAP machine for sleep apnea Self-Care Activities: - Self administers medications as prescribed Calls provider office for new concerns, questions, or BP outside discussed parameters Checks BP and records as discussed Follows a low sodium diet/DASH diet Patient Goals: - Continue to check or have blood pressure checked 2-3 times per week  - write blood pressure results in a log or diary - Follow low sodium/ DASH diet and eat more fruits, vegetables and lean meats. Cook meats by broiling, baking or roasting. Drink plenty of water, read food labels and use smaller plates for portion control.  -  Continue to take your medications as prescribed.  - Attend doctor visits whether virtually and/ or in person (when transportation needs are arranged).  - Use CPAP as recommended by your doctor.  Follow Up Plan: The patient has been provided with contact information for the care management team and has been advised to call with any health related questions or concerns.  The care management team will reach out to the patient again over the next 30 days.        Plan:The patient has been provided with contact information for the care management team and  has been advised to call with any health related questions or concerns.  and The care management team will reach out to the patient again over the next 30 days. George Ina RN,BSN,CCM RN Case Manager [facility] [place]  [phone]

## 2021-03-12 NOTE — Patient Instructions (Signed)
Visit Information: Thank you for taking the time to speak with me.  PATIENT GOALS:  Goals Addressed             This Visit's Progress    Monitoring and management of High Blood pressure   On track    Timeframe:  Long-Range Goal Priority:  Medium Start Date:  02/11/2021                          Expected End Date:    05/19/2021          Follow up:  04/13/2021          Patient Goals:  - Continue to check or have blood pressure checked 2-3 times per week  - write blood pressure results in a log or diary - Follow low sodium/ DASH diet and eat more fruits, vegetables and lean meats. Cook meats by broiling, baking or roasting. Drink plenty of water, read food labels and use smaller plates for portion control.  -  Continue to take your medications as prescribed.  - Attend doctor visits whether virtually and/ or in person (when transportation needs are arranged).  - Use CPAP as recommended by your doctor.         Pain Management Plan Developed   On track    Timeframe:  Long-Range Goal Priority:  High Start Date:      02/11/2021                       Expected End Date: 05/19/2021  Follow up: 04/13/2021  Patient goals  Continue to take your medications as prescribed.  Attend doctors visits as recommended.  (virtually and/ or in person when able) Refill your medications timely Call your provider office for new concerns or questions Continue to turn frequently in bed to prevent skin breakdown. Be aware of signs of skin break down: redness and skin tears.  Report this to your doctor Continue to work with home health physical therapy. Perform home exercises as instructed by your physical / occupational therapist.  Apply hot or cold pack for pain relief to affected area Use assistive devices as recommended by your provider and physical therapist Incorporate healthy food choices to assist with weight loss. Review education information on healthy diet sent to you in My chart by your RN case  manager                    Patient verbalizes understanding of instructions provided today and agrees to view in MyChart.   The patient has been provided with contact information for the care management team and has been advised to call with any health related questions or concerns.  The care management team will reach out to the patient again over the next 30 days.   George Ina RN,BSN,CCM RN Case Manager [facility] [place]  [phone]

## 2021-03-15 ENCOUNTER — Ambulatory Visit: Payer: Medicare HMO | Admitting: *Deleted

## 2021-03-15 ENCOUNTER — Telehealth: Payer: Self-pay | Admitting: Family Medicine

## 2021-03-15 DIAGNOSIS — G4733 Obstructive sleep apnea (adult) (pediatric): Secondary | ICD-10-CM

## 2021-03-15 DIAGNOSIS — F339 Major depressive disorder, recurrent, unspecified: Secondary | ICD-10-CM

## 2021-03-15 DIAGNOSIS — Z7401 Bed confinement status: Secondary | ICD-10-CM

## 2021-03-15 NOTE — Chronic Care Management (AMB) (Deleted)
Chronic Care Management    Clinical Social Work Note  03/15/2021 Name: BESIM CHERRY MRN: [id number] DOB: 11/13/1970  HARSHA ALCIVAR is a 50 y.o. year old male who is a primary care patient of [name], MD. The CCM team was consulted to assist the patient with chronic disease management and/or care coordination needs related to: [employer] , Mental Health Counseling and Resources,   Engaged with patient by telephone for follow up visit in response to provider referral for social work chronic care management and care coordination services.   Consent to Services:  The patient was given information about Chronic Care Management services, agreed to services, and gave verbal consent prior to initiation of services.  Please see initial visit note for detailed documentation.   Patient agreed to services and consent obtained.   Assessment: Review of patient past medical history, allergies, medications, and health status, including review of relevant consultants reports was performed today as part of a comprehensive evaluation and provision of chronic care management and care coordination services.     SDOH (Social Determinants of Health) assessments and interventions performed:    Advanced Directives Status: Not addressed in this encounter.  CCM Care Plan  Allergies  Allergen Reactions   Aspirin Other (See Comments)    Upset stomach   Lactose Intolerance (Gi)     Outpatient Encounter Medications as of 03/15/2021  Medication Sig   allopurinol (ZYLOPRIM) 300 MG tablet TAKE 1 TABLET TWICE DAILY   amLODipine (NORVASC) 10 MG tablet Take 1 tablet (10 mg total) by mouth daily.   colchicine (COLCRYS) 0.6 MG tablet Take 1 tablet (0.6 mg total) by mouth daily as needed.   diclofenac (VOLTAREN) 75 MG EC tablet TAKE 1 TABLET BY MOUTH 2 TIMES DAILY AS NEEDED. TAKE WITH FOOD.   lidocaine (LIDODERM) 5 % PLACE 1 PATCH ONTO THE SKIN DAILY. REMOVE & DISCARD PATCH WITHIN 12 HOURS   naloxone  (NARCAN) nasal spray 4 mg/0.1 mL Spray nostril if needed for opioid overuse- decreased consciousness, decreased respirations   oxyCODONE-acetaminophen (PERCOCET) 10-325 MG tablet Take 1 tablet by mouth every 6 (six) hours as needed for pain.   tiZANidine (ZANAFLEX) 4 MG tablet Take 1 tablet (4 mg total) by mouth every 8 (eight) hours as needed for muscle spasms.   vitamin C (ASCORBIC ACID) 500 MG tablet Take 500 mg by mouth daily.   No facility-administered encounter medications on file as of 03/15/2021.    Patient Active Problem List   Diagnosis Date Noted   Gout, unspecified 03/10/2021   Edema 03/10/2021   Diabetes mellitus without complication (HCC) 03/10/2021   Muscle spasms of both lower extremities 07/29/2020   Bedbound 05/22/2020   Unilateral osteoarthritis of hip, right 12/27/2019   Severe obstructive sleep apnea 02/22/2018   Hypertriglyceridemia 08/24/2017   Low HDL (under 40) 08/24/2017   Metabolic syndrome 08/24/2017   Morbid obesity (HCC) 10/14/2013   HTN (hypertension) 10/14/2013    Conditions to be addressed/monitored: Depression and bariatric needs ; Limited social support, Transportation, Mental Health Concerns , and Lacks knowledge of community resources   Care Plan : Counsellor of Plan for Management of Functional Limitations and Needs  Updates made by Buck Mam, LCSW since 03/15/2021 12:00 AM     Problem: Social and Functional Symptoms      Long-Range Goal: Optimize pt's quality of life and opportunities   Start Date: 01/25/2021  Expected End Date: 06/18/2021  This Visit's Progress: On track  Recent Progress: On  track  Priority: High  Note:   Current barriers:   Transportation, Housing barriers, Level of care concerns, ADL IADL limitations, Social Isolation, Limited access to caregiver, Inability to perform ADL's independently, Inability to perform IADL's independently, and Lacks knowledge of community resource Clinical Goals: Patient will work with  CSW  to address needs related to transportation, PCS/CAPS and other needs as assessed and identified Clinical Interventions:  CSW spoke with pt who confirms things went well with his EMS transport to/from PCP office last week. CSW advised pt that for future need the Provider's office will have to call [facility] and request the exemption auth for EMS rides. Hopefully, pt will will continue to make progress with PT at home and will be able to go my wheelchair Zenaida Niece or other. Pt is optimistic! Pt shared with CSW that he had conversation with PCP about his need/desire for weight loss. Pt states "the Doctor talked to me about it anda medication, Trulicity".  Pt is anticipating a virtual visit for weight loss surgery consultation.  He also voiced interest in counseling support and is agreeable to referral being made to [facility] for arrangements to be made with an Engineer, manufacturing.    Collaboration with Joaquim Nam, MD regarding development and update of comprehensive plan of care as evidenced by provider attestation and co-signature Inter-disciplinary care team collaboration (see longitudinal plan of care) Assessment of needs, barriers , agencies contacted, as well as how impacting  Review various resources, discussed options and provided patient information about Department of Social Services (food stamps, Medicaid, and utilities assistance), Transportation provided by insurance provider, Enhanced Benefits connected with insurance provider, Referral to care guide for community support and other options, SCAT transportation, and [employer] Alternative Program CAP Patient interviewed and appropriate assessments performed Referred patient to community resources care guide team for assistance with ramps (building/ex[expense,etc)  Provided mental health counseling with regard to pt's current isolation and being homebound/bedbound Provided patient with information about SCAT, CAPS, transportation  resources Discussed plans with patient for ongoing care management follow up and provided patient with direct contact information for care management team Advised patient to call DSS worker to request CAPS services Advised patient to complete PCS application/request for increase in hours provided Collaborated with primary care provider re: insurance prior auth process for stretcher transportation Assisted patient/caregiver with obtaining information about health plan benefits Other interventions provided:Solution-Focused Strategies, Active listening / Reflection utilized , Emotional Supportive Provided, Problem Solving /Task Center , Psychoeducation /Health Education, Motivational Interviewing, Brief CBT , Increase in activities / exercise encouraged (with HHPT), and Provided basic mental health support, education   Patient Goals/Self-Care Activities: Over the next 60 days  Follow Up Date 04/05/21  -expect call from [facility] to schedule/arrange initial virtual  counseling - keep 90 percent of counseling appointments - schedule counseling appointment  -contact DSS/Medicaid worker to request CAPS services -speak with PCS agency to request additional hours of care in home -continue working with Home Health PT -expect outreach call from our team Care Guide to provide resources for ramps and other - begin a notebook of services in my neighborhood or community - call 211 when I need some help - follow-up on any referrals for help I am given - think ahead to make sure my need does not become an emergency - make a list of family or friends that I can call        Follow Up Plan: Appointment scheduled for SW follow up with client by phone on: 04/05/21  Reece Levy MSW, LCSW Licensed Clinical Social Worker [facility] [place] [phone]  Chronic Care Management    Clinical Social Work Note  03/15/2021 Name: AMANDEEP NESMITH MRN: [id number] DOB: 1971-01-18  ROCH QUACH is a 50 y.o. year  old male who is a primary care patient of Joaquim Nam, MD. The CCM team was consulted to assist the patient with chronic disease management and/or care coordination needs related to: {CCM SW CONSULT REASONS:25078}.   {CCMTELEPHONEFACETOFACE:21091510} for {CCMINITIALFOLLOWUPCHOICE:21091511} in response to provider referral for social work chronic care management and care coordination services.   Consent to Services:  {CCMCONSENTOPTIONS:25074}  Patient agreed to services and consent obtained.   Assessment: Review of patient past medical history, allergies, medications, and health status, including review of relevant consultants reports was performed today as part of a comprehensive evaluation and provision of chronic care management and care coordination services.     SDOH (Social Determinants of Health) assessments and interventions performed:    Advanced Directives Status: {Advanced Directives Status:25048}  CCM Care Plan  Allergies  Allergen Reactions   Aspirin Other (See Comments)    Upset stomach   Lactose Intolerance (Gi)     Outpatient Encounter Medications as of 03/15/2021  Medication Sig   allopurinol (ZYLOPRIM) 300 MG tablet TAKE 1 TABLET TWICE DAILY   amLODipine (NORVASC) 10 MG tablet Take 1 tablet (10 mg total) by mouth daily.   colchicine (COLCRYS) 0.6 MG tablet Take 1 tablet (0.6 mg total) by mouth daily as needed.   diclofenac (VOLTAREN) 75 MG EC tablet TAKE 1 TABLET BY MOUTH 2 TIMES DAILY AS NEEDED. TAKE WITH FOOD.   lidocaine (LIDODERM) 5 % PLACE 1 PATCH ONTO THE SKIN DAILY. REMOVE & DISCARD PATCH WITHIN 12 HOURS   naloxone (NARCAN) nasal spray 4 mg/0.1 mL Spray nostril if needed for opioid overuse- decreased consciousness, decreased respirations   oxyCODONE-acetaminophen (PERCOCET) 10-325 MG tablet Take 1 tablet by mouth every 6 (six) hours as needed for pain.   tiZANidine (ZANAFLEX) 4 MG tablet Take 1 tablet (4 mg total) by mouth every 8 (eight) hours as needed  for muscle spasms.   vitamin C (ASCORBIC ACID) 500 MG tablet Take 500 mg by mouth daily.   No facility-administered encounter medications on file as of 03/15/2021.    Patient Active Problem List   Diagnosis Date Noted   Gout, unspecified 03/10/2021   Edema 03/10/2021   Diabetes mellitus without complication (HCC) 03/10/2021   Muscle spasms of both lower extremities 07/29/2020   Bedbound 05/22/2020   Unilateral osteoarthritis of hip, right 12/27/2019   Severe obstructive sleep apnea 02/22/2018   Hypertriglyceridemia 08/24/2017   Low HDL (under 40) 08/24/2017   Metabolic syndrome 08/24/2017   Morbid obesity (HCC) 10/14/2013   HTN (hypertension) 10/14/2013    Conditions to be addressed/monitored: {CCM ASSESSMENT DZ OPTIONS:25047}; {CCM SW BARRIERS:25076}  Care Plan : Development of Plan for Management of Functional Limitations and Needs  Updates made by Buck Mam, LCSW since 03/15/2021 12:00 AM     Problem: Social and Functional Symptoms      Long-Range Goal: Optimize pt's quality of life and opportunities   Start Date: 01/25/2021  Expected End Date: 06/18/2021  This Visit's Progress: On track  Recent Progress: On track  Priority: High  Note:   Current barriers:   Transportation, Housing barriers, Level of care concerns, ADL IADL limitations, Social Isolation, Limited access to caregiver, Inability to perform ADL's independently, Inability to perform IADL's independently, and Lacks knowledge of  community resource Clinical Goals: Patient will work with CSW  to address needs related to transportation, PCS/CAPS and other needs as assessed and identified Clinical Interventions:  CSW spoke with pt who confirms things went well with his EMS transport to/from PCP office last week. CSW advised pt that for future need the Provider's office will have to call [employer] and request the exemption auth for EMS rides. Hopefully, pt will will continue to make progress with PT at home and will  be able to go my wheelchair Zenaida Niece or other. Pt is optimistic! Pt shared with CSW that he had conversation with PCP about his need/desire for weight loss. Pt states "the Doctor talked to me about it anda medication, Trulicity".  Pt is anticipating a virtual visit for weight loss surgery consultation.  He also voiced interest in counseling support and is agreeable to referral being made to [facility] for arrangements to be made with an Engineer, manufacturing.    Collaboration with Joaquim Nam, MD regarding development and update of comprehensive plan of care as evidenced by provider attestation and co-signature Inter-disciplinary care team collaboration (see longitudinal plan of care) Assessment of needs, barriers , agencies contacted, as well as how impacting  Review various resources, discussed options and provided patient information about Department of Social Services (food stamps, Medicaid, and utilities assistance), Transportation provided by insurance provider, Enhanced Benefits connected with insurance provider, Referral to care guide for community support and other options, SCAT transportation, and [employer] Alternative Program CAP Patient interviewed and appropriate assessments performed Referred patient to community resources care guide team for assistance with ramps (building/ex[expense,etc)  Provided mental health counseling with regard to pt's current isolation and being homebound/bedbound Provided patient with information about SCAT, CAPS, transportation resources Discussed plans with patient for ongoing care management follow up and provided patient with direct contact information for care management team Advised patient to call DSS worker to request CAPS services Advised patient to complete PCS application/request for increase in hours provided Collaborated with primary care provider re: insurance prior auth process for stretcher transportation Assisted patient/caregiver with obtaining  information about health plan benefits Other interventions provided:Solution-Focused Strategies, Active listening / Reflection utilized , Emotional Supportive Provided, Problem Solving /Task Center , Psychoeducation /Health Education, Motivational Interviewing, Brief CBT , Increase in activities / exercise encouraged (with HHPT), and Provided basic mental health support, education   Patient Goals/Self-Care Activities: Over the next 60 days  Follow Up Date 04/05/21  -expect call from [facility] to schedule/arrange initial virtual  counseling - keep 90 percent of counseling appointments - schedule counseling appointment  -contact DSS/Medicaid worker to request CAPS services -speak with PCS agency to request additional hours of care in home -continue working with Home Health PT -expect outreach call from our team Care Guide to provide resources for ramps and other - begin a notebook of services in my neighborhood or community - call 211 when I need some help - follow-up on any referrals for help I am given - think ahead to make sure my need does not become an emergency - make a list of family or friends that I can call        Follow Up Plan: {CCM SW FOLLOW UP BDZH:29924}      SIG***

## 2021-03-15 NOTE — Telephone Encounter (Signed)
Please update patient.  My understanding is that he is going to get a call from staff here about trulicity use.    Hoyer Lift:  Order placed for [employer] - please fax to [employer] or alert them through Epic of the order being placed.   Pulmonary:  Asht[facility] called and spoke with Pulm - we found a previous message in the patient chart from March. Dr Craige Cotta previously agreed to see the patient BUT for mask fit regarding CPAP he would need to be seen in person or titrated (that's the ONO / CPAP titration study that I was telling you about)  Basically he just needs to set up an appt with Dr Craige Cotta [facility] and then go from there -- he was never seen back in March when Dr Craige Cotta initially agreed to see him.   General Surgery:  Dr Dossie Der would be willing to go downstairs and visit with him in the Ambulance or Transport vehicle - they do not have doors wide enough to get large stretchers/wheelchairs through.  Patient will have to complete the Bariatric Process Seminar - once complete he will be contacted by a Bariatric Coordinator.  Must go through their website to register [email or web address] -- Follow prompts to sign up for seminar -- once seminar is complete one of the Bariatric Coordinators will contact him to set up appt. Dr Dossie Der said that he would see this patient once Bariatric process is complete.

## 2021-03-15 NOTE — Patient Instructions (Signed)
Visit Information  PATIENT GOALS:  Goals Addressed               This Visit's Progress     Begin and Stick with Counseling-Depression (pt-stated)        Timeframe:  Long-Range Goal Priority:  High Start Date:  03/15/21                            Expected End Date:         06/18/2021              Follow Up Date 04/05/2021    - expect call from [facility] to schedule/arrange initial virutal  counseling - keep 90 percent of counseling appointments - schedule counseling appointment    Why is this important?   Beating depression may take some time.  If you don't feel better right away, don't give up on your treatment plan.    Notes:        Find Help in My Community        Timeframe:  Long-Range Goal Priority:  High Start Date:    03/15/21                         Expected End Date: 06/18/2021                      Follow Up Date 04/05/2021    - begin a notebook of services in my neighborhood or community - call 211 when I need some help - follow-up on any referrals for help I am given - think ahead to make sure my need does not become an emergency - make a note about what I need to have by the phone or take with me, like an identification card or social security number have a back-up plan - have a back-up plan - make a list of family or friends that I can call    Why is this important?   Knowing how and where to find help for yourself or family in your neighborhood and community is an important skill.  You will want to take some steps to learn how.    Notes:        COMPLETED: Need help to get out of house and to Dr Lianne Bushy (pt-stated)        Timeframe:  Long-Range Goal Priority:  High Start Date:       01/25/2021                      Expected End Date:  03/18/2021                     Follow Up Date 03/15/2021   -expect [facility] arrival at 10am 03/09/2021 for PCP visit scheduled for at 11:30 03/09/21 -contact DSS/Medicaid worker to request CAPS  services -complete PCS application to request additional hours of care in home -continue working with Home Health PT -expect outreach call from our team Care Guide to provide resources for ramps and other - begin a notebook of services in my neighborhood or community - call 211 when I need some help - follow-up on any referrals for help I am given - think ahead to make sure my need does not become an emergency - make a list of family or friends that I can call    Why is this important?  Knowing how and where to find help for yourself or family in your neighborhood and community is an important skill.  You will want to take some steps to learn how.    Notes:          The patient verbalized understanding of instructions, educational materials, and care plan provided today and declined offer to receive copy of patient instructions, educational materials, and care plan.   Telephone follow up appointment with care management team member scheduled for:04/05/21  Reece Levy MSW, LCSW Licensed Clinical Social Worker [facility] [place] [phone]

## 2021-03-15 NOTE — Chronic Care Management (AMB) (Signed)
Chronic Care Management    Clinical Social Work Note  03/15/2021 Name: Michael Payne MRN: [id number] DOB: 18-Mar-1971  Michael Payne is a 50 y.o. year old male who is a primary care patient of [name], MD. The CCM team was consulted to assist the patient with chronic disease management and/or care coordination needs related to: [employer]  and Mental Health Counseling and Resources.   Engaged with patient by telephone for follow up visit in response to provider referral for social work chronic care management and care coordination services.   Consent to Services:  The patient was given information about Chronic Care Management services, agreed to services, and gave verbal consent prior to initiation of services.  Please see initial visit note for detailed documentation.   Patient agreed to services and consent obtained.   Assessment: Review of patient past medical history, allergies, medications, and health status, including review of relevant consultants reports was performed today as part of a comprehensive evaluation and provision of chronic care management and care coordination services.     SDOH (Social Determinants of Health) assessments and interventions performed:    Advanced Directives Status: Not addressed in this encounter.  CCM Care Plan  Allergies  Allergen Reactions   Aspirin Other (See Comments)    Upset stomach   Lactose Intolerance (Gi)     Outpatient Encounter Medications as of 03/15/2021  Medication Sig   allopurinol (ZYLOPRIM) 300 MG tablet TAKE 1 TABLET TWICE DAILY   amLODipine (NORVASC) 10 MG tablet Take 1 tablet (10 mg total) by mouth daily.   colchicine (COLCRYS) 0.6 MG tablet Take 1 tablet (0.6 mg total) by mouth daily as needed.   diclofenac (VOLTAREN) 75 MG EC tablet TAKE 1 TABLET BY MOUTH 2 TIMES DAILY AS NEEDED. TAKE WITH FOOD.   lidocaine (LIDODERM) 5 % PLACE 1 PATCH ONTO THE SKIN DAILY. REMOVE & DISCARD PATCH WITHIN 12 HOURS    naloxone (NARCAN) nasal spray 4 mg/0.1 mL Spray nostril if needed for opioid overuse- decreased consciousness, decreased respirations   oxyCODONE-acetaminophen (PERCOCET) 10-325 MG tablet Take 1 tablet by mouth every 6 (six) hours as needed for pain.   tiZANidine (ZANAFLEX) 4 MG tablet Take 1 tablet (4 mg total) by mouth every 8 (eight) hours as needed for muscle spasms.   vitamin C (ASCORBIC ACID) 500 MG tablet Take 500 mg by mouth daily.   No facility-administered encounter medications on file as of 03/15/2021.    Patient Active Problem List   Diagnosis Date Noted   Gout, unspecified 03/10/2021   Edema 03/10/2021   Diabetes mellitus without complication (HCC) 03/10/2021   Muscle spasms of both lower extremities 07/29/2020   Bedbound 05/22/2020   Unilateral osteoarthritis of hip, right 12/27/2019   Severe obstructive sleep apnea 02/22/2018   Hypertriglyceridemia 08/24/2017   Low HDL (under 40) 08/24/2017   Metabolic syndrome 08/24/2017   Morbid obesity (HCC) 10/14/2013   HTN (hypertension) 10/14/2013    Conditions to be addressed/monitored: HTN and Depression; Mental Health Concerns   Care Plan : Development of Plan for Management of Functional Limitations and Needs  Updates made by Buck Mam, LCSW since 03/15/2021 12:00 AM     Problem: Social and Functional Symptoms      Long-Range Goal: Optimize pt's quality of life and opportunities   Start Date: 01/25/2021  Expected End Date: 06/18/2021  This Visit's Progress: On track  Recent Progress: On track  Priority: High  Note:   Current barriers:  Transportation, Housing barriers, Level of care concerns, ADL IADL limitations, Social Isolation, Limited access to caregiver, Inability to perform ADL's independently, Inability to perform IADL's independently, and Lacks knowledge of community resource Clinical Goals: Patient will work with CSW  to address needs related to transportation, PCS/CAPS and other needs as assessed and  identified Clinical Interventions:  CSW spoke with pt who confirms things went well with his EMS transport to/from PCP office last week. CSW advised pt that for future need the Provider's office will have to call [facility] and request the exemption auth for EMS rides. Hopefully, pt will will continue to make progress with PT at home and will be able to go my wheelchair Zenaida Niece or other. Pt is optimistic! Pt shared with CSW that he had conversation with PCP about his need/desire for weight loss. Pt states "the Doctor talked to me about it anda medication, Trulicity".  Pt is anticipating a virtual visit for weight loss surgery consultation.  He also voiced interest in counseling support and is agreeable to referral being made to [facility] for arrangements to be made with an Engineer, manufacturing.    Collaboration with Joaquim Nam, MD regarding development and update of comprehensive plan of care as evidenced by provider attestation and co-signature Inter-disciplinary care team collaboration (see longitudinal plan of care) Assessment of needs, barriers , agencies contacted, as well as how impacting  Review various resources, discussed options and provided patient information about Department of Social Services (food stamps, Medicaid, and utilities assistance), Transportation provided by insurance provider, Enhanced Benefits connected with insurance provider, Referral to care guide for community support and other options, SCAT transportation, and [employer] Alternative Program CAP Patient interviewed and appropriate assessments performed Referred patient to community resources care guide team for assistance with ramps (building/ex[expense,etc)  Provided mental health counseling with regard to pt's current isolation and being homebound/bedbound Provided patient with information about SCAT, CAPS, transportation resources Discussed plans with patient for ongoing care management follow up and provided patient with  direct contact information for care management team Advised patient to call DSS worker to request CAPS services Advised patient to complete PCS application/request for increase in hours provided Collaborated with primary care provider re: insurance prior auth process for stretcher transportation Assisted patient/caregiver with obtaining information about health plan benefits Other interventions provided:Solution-Focused Strategies, Active listening / Reflection utilized , Emotional Supportive Provided, Problem Solving /Task Center , Psychoeducation /Health Education, Motivational Interviewing, Brief CBT , Increase in activities / exercise encouraged (with HHPT), and Provided basic mental health support, education   Patient Goals/Self-Care Activities: Over the next 60 days  Follow Up Date 04/05/21  -expect call from [facility] to schedule/arrange initial virtual  counseling - keep 90 percent of counseling appointments - schedule counseling appointment  -contact DSS/Medicaid worker to request CAPS services -speak with PCS agency to request additional hours of care in home -continue working with Home Health PT -expect outreach call from our team Care Guide to provide resources for ramps and other - begin a notebook of services in my neighborhood or community - call 211 when I need some help - follow-up on any referrals for help I am given - think ahead to make sure my need does not become an emergency - make a list of family or friends that I can call        Follow Up Plan: Appointment scheduled for SW follow up with client by phone on: 04/05/21      Reece Levy MSW, LCSW Licensed Clinical Social  Worker [employer] [facility] [place] [phone]

## 2021-03-16 ENCOUNTER — Other Ambulatory Visit: Payer: Self-pay

## 2021-03-16 ENCOUNTER — Telehealth: Payer: Self-pay | Admitting: Family Medicine

## 2021-03-16 ENCOUNTER — Ambulatory Visit: Payer: Medicare HMO

## 2021-03-16 ENCOUNTER — Telehealth: Payer: Self-pay

## 2021-03-16 DIAGNOSIS — Z87891 Personal history of nicotine dependence: Secondary | ICD-10-CM | POA: Diagnosis not present

## 2021-03-16 DIAGNOSIS — E119 Type 2 diabetes mellitus without complications: Secondary | ICD-10-CM

## 2021-03-16 DIAGNOSIS — I10 Essential (primary) hypertension: Secondary | ICD-10-CM | POA: Diagnosis not present

## 2021-03-16 DIAGNOSIS — F339 Major depressive disorder, recurrent, unspecified: Secondary | ICD-10-CM | POA: Diagnosis not present

## 2021-03-16 DIAGNOSIS — D649 Anemia, unspecified: Secondary | ICD-10-CM | POA: Diagnosis not present

## 2021-03-16 DIAGNOSIS — K219 Gastro-esophageal reflux disease without esophagitis: Secondary | ICD-10-CM | POA: Diagnosis not present

## 2021-03-16 DIAGNOSIS — M1611 Unilateral primary osteoarthritis, right hip: Secondary | ICD-10-CM | POA: Diagnosis not present

## 2021-03-16 DIAGNOSIS — G4733 Obstructive sleep apnea (adult) (pediatric): Secondary | ICD-10-CM | POA: Diagnosis not present

## 2021-03-16 DIAGNOSIS — Z9181 History of falling: Secondary | ICD-10-CM | POA: Diagnosis not present

## 2021-03-16 DIAGNOSIS — Z6841 Body Mass Index (BMI) 40.0 and over, adult: Secondary | ICD-10-CM | POA: Diagnosis not present

## 2021-03-16 DIAGNOSIS — M150 Primary generalized (osteo)arthritis: Secondary | ICD-10-CM | POA: Diagnosis not present

## 2021-03-16 MED ORDER — TRULICITY 0.75 MG/0.5ML ~~LOC~~ SOAJ: 0.7500 mg | SUBCUTANEOUS | 2 refills | Status: DC

## 2021-03-16 NOTE — Telephone Encounter (Signed)
I spoke with patient about Trulicity for diabetes and weight loss. He is interested in starting therapy. He states he was on once weekly injection before and did well on it. I could not find documentation of this in the chart. Due to ease of use I would favor Trulicity over Ozempic despite Ozempic having greater weight loss benefits.   No contraindications - denies any h/o thyroid cancer, pancreatitis Denies h/o gastroparesis Counseled on:  - Most common side effects - nausea, vomiting, diarrhea  - Eating small, low fat meals will reduce side effects  - We will start on a low dose to reduce side effects but will likely increase dose over time  - Importance of healthy diet in addition to Trulicity  - Administration, once weekly inject into abdomen, single use pen, storage   He would like Trulicity 0.75 mg [facility] to [facility] on [address]. He will call if any cost concerns or side effects.  Lab Results  Component Value Date/Time   HGBA1C 6.5 03/09/2021 12:32 PM   HGBA1C 5.6 05/06/2019 04:25 PM   HGBA1C 5.6 10/29/2018 12:49 PM   Phil Dopp, PharmD Clinical Pharmacist [facility] [phone]

## 2021-03-16 NOTE — Progress Notes (Signed)
Chronic Care Management Pharmacy Note  03/16/2021 Name:  AQUAN KOPE MRN:  [id number] DOB:  1971/08/15  Subjective: Michael Payne is an 50 y.o. year old male who is a primary patient of Damita Dunnings, Elveria Rising, MD.  The CCM team was consulted for assistance with disease management and care coordination needs.    Engaged with patient by telephone for initial visit in response to provider referral for pharmacy case management and/or care coordination services.   Consent to Services:  The patient was given the following information about Chronic Care Management services today, agreed to services, and gave verbal consent: 1. CCM service includes personalized support from designated clinical staff supervised by the primary care provider, including individualized plan of care and coordination with other care providers 2. 24/7 contact phone numbers for assistance for urgent and routine care needs. 3. Service will only be billed when office clinical staff spend 20 minutes or more in a month to coordinate care. 4. Only one practitioner may furnish and bill the service in a calendar month. 5.The patient may stop CCM services at any time (effective at the end of the month) by phone call to the office staff. 6. The patient will be responsible for cost sharing (co-pay) of up to 20% of the service fee (after annual deductible is met). Patient agreed to services and consent obtained.  Patient Care Team: [name], MD as PCP - General (Family Medicine) Deirdre Peer, LCSW as Social Worker (Licensed Clinical Social Worker) Dannielle Karvonen, RN as Case Manager  Recent office visits: 03/09/21 - PCP - Patient presented to PCP for follow up visit. PCP will look into getting a lift and extra help at home, pulmonology referral and weight loss medication.   Recent consult visits: Following with CCM RN and [facility] visits: None in previous 6 months  Objective:  Lab Results  Component Value Date    CREATININE 0.68 03/09/2021   BUN 10 03/09/2021   GFR 109.18 03/09/2021   GFRNONAA >60 01/03/2020   GFRAA >60 01/03/2020   NA 139 03/09/2021   K 4.5 03/09/2021   CALCIUM 9.1 03/09/2021   CO2 35 (H) 03/09/2021   GLUCOSE 93 03/09/2021    Lab Results  Component Value Date/Time   HGBA1C 6.5 03/09/2021 12:32 PM   HGBA1C 5.6 05/06/2019 04:25 PM   HGBA1C 5.6 10/29/2018 12:49 PM   GFR 109.18 03/09/2021 12:32 PM   GFR 116.29 12/18/2019 05:12 PM    Lab Results  Component Value Date   CHOL 165 03/09/2021   HDL 38.50 (L) 03/09/2021   LDLCALC 101 (H) 03/09/2021   LDLDIRECT 76.0 10/29/2018   TRIG 129.0 03/09/2021   CHOLHDL 4 03/09/2021    Hepatic Function Latest Ref Rng & Units 03/09/2021 01/01/2020 12/28/2019  Total Protein 6.0 - 8.3 g/dL 7.3 7.1 6.9  Albumin 3.5 - 5.2 g/dL 3.9 3.3(L) 3.4(L)  AST 0 - 37 U/L 25 43(H) 27  ALT 0 - 53 U/L 29 60(H) 40  Alk Phosphatase 39 - 117 U/L 62 60 67  Total Bilirubin 0.2 - 1.2 mg/dL 0.4 0.6 0.7    Lab Results  Component Value Date/Time   TSH 4.33 03/09/2021 12:32 PM   TSH 2.63 12/18/2019 05:12 PM    CBC Latest Ref Rng & Units 03/09/2021 01/01/2020 12/28/2019  WBC 4.0 - 10.5 K/uL 8.4 6.6 7.6  Hemoglobin 13.0 - 17.0 g/dL 14.2 13.6 13.2  Hematocrit 39.0 - 52.0 % 44.6 43.4 42.2  Platelets  150.0 - 400.0 K/uL 261.0 246 210    Lab Results  Component Value Date/Time   VD25OH 10.08 (L) 12/18/2019 05:12 PM    Clinical ASCVD: No  The 10-year ASCVD risk score Mikey Bussing DC Jr., et al., 2013) is: 23%   Values used to calculate the score:     Age: 6 years     Sex: Male     Is Non-Hispanic African American: Yes     Diabetic: Yes     Tobacco smoker: No     Systolic Blood Pressure: 008 mmHg     Is BP treated: Yes     HDL Cholesterol: 38.5 mg/dL     Total Cholesterol: 165 mg/dL    Depression screen [facility] 2/9 02/11/2021 09/02/2020 07/29/2020  Decreased Interest 0 0 2  Down, Depressed, Hopeless 0 0 0  PHQ - 2 Score 0 0 2  Altered sleeping - - 3  Tired,  decreased energy - - 1  Change in appetite - - 0  Feeling bad or failure about yourself  - - 0  Trouble concentrating - - 0  Moving slowly or fidgety/restless - - 0  Suicidal thoughts - - 0  PHQ-9 Score - - 6  Difficult doing work/chores - - -    Social History   Tobacco Use  Smoking Status Former   Packs/day: 2.00   Years: 22.00   Pack years: 44.00   Types: Cigarettes   Quit date: 02/24/2011   Years since quitting: 10.0  Smokeless Tobacco Never   BP Readings from Last 3 Encounters:  03/09/21 (!) 160/119  11/05/20 127/86  06/27/20 (!) 144/83   Pulse Readings from Last 3 Encounters:  03/09/21 88  11/05/20 97  01/03/20 73   Wt Readings from Last 3 Encounters:  11/05/20 (!) 490 lb (222.3 kg)  09/02/20 (!) 490 lb (222.3 kg)  07/29/20 (!) 496 lb (225 kg)   BMI Readings from Last 3 Encounters:  11/05/20 59.64 kg/m  09/02/20 59.64 kg/m  07/29/20 60.38 kg/m    Assessment/Interventions: Review of patient past medical history, allergies, medications, health status, including review of consultants reports, laboratory and other test data, was performed as part of comprehensive evaluation and provision of chronic care management services.   SDOH:  (Social Determinants of Health) assessments and interventions performed: Yes SDOH Interventions    Flowsheet Row Most Recent Value  SDOH Interventions   Financial Strain Interventions Intervention Not Indicated      SDOH Screenings   Alcohol Screen: Low Risk    Last Alcohol Screening Score (AUDIT): 0  Depression (PHQ2-9): Low Risk    PHQ-2 Score: 0  Financial Resource Strain: Low Risk    Difficulty of Paying Living Expenses: Not very hard  Food Insecurity: No Food Insecurity   Worried About Charity fundraiser in the Last Year: Never true   Ran Out of Food in the Last Year: Never true  Housing: Low Risk    Last Housing Risk Score: 0  Physical Activity: Inactive   Days of Exercise per Week: 0 days   Minutes of Exercise  per Session: 0 min  Social Connections: Not on file  Stress: No Stress Concern Present   Feeling of Stress : Not at all  Tobacco Use: Medium Risk   Smoking Tobacco Use: Former   Smokeless Tobacco Use: Never  Transportation Needs: Public librarian (Medical): Yes   Lack of Transportation (Non-Medical): No    CCM Care Plan  Allergies  Allergen Reactions   Aspirin Other (See Comments)    Upset stomach   Lactose Intolerance (Gi)     Medications Reviewed Today     Reviewed by Debbora Dus, [facility] (Pharmacist) on 03/16/21 at 1045  Med List Status: <None>   Medication Order Taking? Sig Documenting Provider Last Dose Status Informant  allopurinol (ZYLOPRIM) 300 MG tablet [id number] Yes TAKE 1 TABLET TWICE DAILY Elby Beck, FNP Taking Active   amLODipine (NORVASC) 10 MG tablet [id number] Yes Take 1 tablet (10 mg total) by mouth daily. [name], MD Taking Active   colchicine (COLCRYS) 0.6 MG tablet [id number] Yes Take 1 tablet (0.6 mg total) by mouth daily as needed. [name], MD Taking Active   diclofenac (VOLTAREN) 75 MG EC tablet [id number] Yes TAKE 1 TABLET BY MOUTH 2 TIMES DAILY AS NEEDED. TAKE WITH FOOD. [name], MD Taking Active   Dulaglutide (TRULICITY) 0.86 PY/1.9JK [employer] [id number]  Inject 0.75 mg into the skin once a week. [name], MD  Active   lidocaine (LIDODERM) 5 % [id number]  PLACE 1 PATCH ONTO THE SKIN DAILY. REMOVE & DISCARD [facility] WITHIN 12 HOURS [name], MD  Active   naloxone [facility]) nasal spray 4 mg/0.1 mL [id number]  Spray nostril if needed for opioid overuse- decreased consciousness, decreased respirations Elby Beck, FNP  Active   oxyCODONE-acetaminophen (PERCOCET) 10-325 MG tablet [id number] Yes Take 1 tablet by mouth every 6 (six) hours as needed for pain. [name], MD Taking Active   tiZANidine (ZANAFLEX) 4 MG tablet [id number] Yes Take 1 tablet (4 mg total) by mouth every 8  (eight) hours as needed for muscle spasms. [name], MD Taking Active   vitamin C (ASCORBIC ACID) 500 MG tablet [id number]  Take 500 mg by mouth daily. [provider]  Active             Patient Active Problem List   Diagnosis Date Noted   Gout, unspecified 03/10/2021   Edema 03/10/2021   Diabetes mellitus without complication ([place]) 62/22/9798   Muscle spasms of both lower extremities 07/29/2020   Bedbound 05/22/2020   Unilateral osteoarthritis of hip, right 12/27/2019   Severe obstructive sleep apnea 02/22/2018   Hypertriglyceridemia 08/24/2017   Low HDL (under 40) 92/07/9416   Metabolic syndrome 40/81/4481   Morbid obesity ([place]) 10/14/2013   HTN (hypertension) 10/14/2013    Immunization History  Administered Date(s) Administered   Tdap 02/03/2012    Conditions to be addressed/monitored:  Hypertension and Diabetes  Care Plan : [place]  Updates made by Debbora Dus, [place] since 03/16/2021 12:00 AM     Problem: CHL AMB "PATIENT-SPECIFIC PROBLEM"      Long-Range Goal: Disease Management   Start Date: 03/16/2021  Note:   Current Barriers:  No medication concerns identified  Pharmacist Clinical Goal(s):  Patient will contact provider office for questions/concerns as evidenced notation of same in electronic health record through collaboration with PharmD and provider.   Interventions: 1:1 collaboration with [name], MD regarding development and update of comprehensive plan of care as evidenced by provider attestation and co-signature Inter-disciplinary care team collaboration (see longitudinal plan of care) Comprehensive medication review performed; medication list updated in electronic medical record  Hypertension (BP goal <140/90) -Controlled - per home readings  -Current treatment: Amlodipine 10 mg - 1 tablet daily -Medications previously tried: none reported  -Current home readings: Patient reports BP is checked by PT  twice  weekly - usually around 135/81-88 mmHg -Denies hypotensive/hypertensive symptoms -Educated on BP goals and benefits of medications for prevention of heart attack, stroke and kidney damage; -Continue to monitor BP at home twice weekly, document, and provide log at future appointments -Recommended to continue current medication  Diabetes (A1c goal <7%) -Controlled - A1c 6.5% -Current medications: None -Medications previously tried: None reported   -Current home glucose readings - none reported today -Denies hypoglycemic/hyperglycemic symptoms -Current meal patterns: pt reports he is working on diet - limiting carbs, sweets, and fried foods.  drinks: diet soda - 1-2 per day, water - Weight loss: He reports last attempted weight loss about 3 years ago for surgery. This was delayed due to family emergency. He is currently enrolled in a weight loss program and reports first visit is soon, pending transportation.   He denies counting calories at this time. -Current exercise: bed bound -Educated on A1c and blood sugar goals; -I was consulted by PCP to discuss Trulicity cost and concerns with patient. This was reviewed. See telephone note 03/16/21. -Recommended start Trulicity for weight loss/DM. See telephone note.   Patient Goals/Self-Care Activities Patient will:  - take medications as prescribed  Follow Up Plan: Telephone follow up appointment with care management team member scheduled for:  30 days      Medication Assistance: None required.  Patient affirms current coverage meets needs.  Compliance/Adherence/Medication fill history: Care Gaps: Diabetes eye and foot exam due  Star-Rating Drugs: None   [id number]: [phone]: [phone] [facility] [place] [address] [id number]: [phone]: [phone] Uses pill box? Yes - he fills a 7 day pillbox weekly  Pt endorses 100% compliance  Care Plan and Follow Up Patient Decision:  Patient agrees to Care Plan and Follow-up.  MDebbora Dus PharmD Clinical Pharmacist [phone]

## 2021-03-16 NOTE — Telephone Encounter (Signed)
Patient called stating that he received a call from a DME company stating that they were sending over an order request for Incontinence Supplies to our office. Patient cannot recall the name of the DME Company - Phone # [phone]  Shanda Bumps please keep an eye out for this fax

## 2021-03-16 NOTE — Patient Instructions (Signed)
March 16, 2021  Dear Michael Payne,  It was a pleasure meeting you during our initial appointment on March 16, 2021. Below is a summary of the goals we discussed and components of chronic care management. Please contact me anytime with questions or concerns.   Visit Information  Long-Range Goal: Disease Management   Start Date: 03/16/2021  Note:   Current Barriers:  No medication concerns identified  Pharmacist Clinical Goal(s):  Patient will contact provider office for questions/concerns as evidenced notation of same in electronic health record through collaboration with PharmD and provider.   Interventions: 1:1 collaboration with Michael Nam, MD regarding development and update of comprehensive plan of care as evidenced by provider attestation and co-signature Inter-disciplinary care team collaboration (see longitudinal plan of care) Comprehensive medication review performed; medication list updated in electronic medical record  Hypertension (BP goal <140/90) -Controlled - per home readings  -Current treatment: Amlodipine 10 mg - 1 tablet daily -Medications previously tried: none reported  -Current home readings: Patient reports BP is checked by PT twice weekly - usually around 135/81-88 mmHg -Denies hypotensive/hypertensive symptoms -Educated on BP goals and benefits of medications for prevention of heart attack, stroke and kidney damage; -Continue to monitor BP at home twice weekly, document, and provide log at future appointments -Recommended to continue current medication  Diabetes (A1c goal <7%) -Controlled - A1c 6.5% -Current medications: None -Medications previously tried: None reported   -Current home glucose readings - none reported today -Denies hypoglycemic/hyperglycemic symptoms -Current meal patterns: pt reports he is working on diet - limiting carbs, sweets, and fried foods.  drinks: diet soda - 1-2 per day, water - Weight loss: He reports last attempted  weight loss about 3 years ago for surgery. This was delayed due to family emergency. He is currently enrolled in a weight loss program and reports first visit is soon, pending transportation.   He denies counting calories at this time. -Current exercise: bed bound -Educated on A1c and blood sugar goals; -I was consulted by PCP to discuss Trulicity cost and concerns with patient. This was reviewed. See telephone note 03/16/21. -Recommended start Trulicity for weight loss/DM. See telephone note.   Patient Goals/Self-Care Activities Patient will:  - take medications as prescribed  Follow Up Plan: Telephone follow up appointment with care management team member scheduled for:  30 days       Michael Payne was given information about Chronic Care Management services today including:  CCM service includes personalized support from designated clinical staff supervised by his physician, including individualized plan of care and coordination with other care providers 24/7 contact phone numbers for assistance for urgent and routine care needs. Standard insurance, coinsurance, copays and deductibles apply for chronic care management only during months in which we provide at least 20 minutes of these services. Most insurances cover these services at 100%, however patients may be responsible for any copay, coinsurance and/or deductible if applicable. This service may help you avoid the need for more expensive face-to-face services. Only one practitioner may furnish and bill the service in a calendar month. The patient may stop CCM services at any time (effective at the end of the month) by phone call to the office staff.  Patient agreed to services and verbal consent obtained.   Patient verbalizes understanding of instructions provided today and agrees to view in MyChart.   Michael Payne, PharmD Clinical Pharmacist  Primary [facility] at [facility] [phone]'

## 2021-03-16 NOTE — Telephone Encounter (Signed)
Rx sent.  Please have patient update [place] about use in about 2 weeks, sooner if needed.  We'll need to see about rechecking his A1c in about 3 months.  Thanks.

## 2021-03-16 NOTE — Telephone Encounter (Signed)
Spoke with patient about below. Patient will call back in about 2 weeks or sooner if needed with update. Patient will make 3 month f/u appt then.

## 2021-03-16 NOTE — Telephone Encounter (Signed)
Spoke with patient about everything below. Advised patient I would also send this message thru mychart so he could go back and reference; also gave him the phone numbers to surgery and pulmonary to schedule his appts with them.

## 2021-03-17 NOTE — Telephone Encounter (Signed)
I will work on the hardcopy when I get back to clinic, as soon as I can.  Thanks.

## 2021-03-17 NOTE — Telephone Encounter (Signed)
Order form received and put in your inbox.

## 2021-03-18 DIAGNOSIS — K219 Gastro-esophageal reflux disease without esophagitis: Secondary | ICD-10-CM | POA: Diagnosis not present

## 2021-03-18 DIAGNOSIS — Z87891 Personal history of nicotine dependence: Secondary | ICD-10-CM | POA: Diagnosis not present

## 2021-03-18 DIAGNOSIS — Z9181 History of falling: Secondary | ICD-10-CM | POA: Diagnosis not present

## 2021-03-18 DIAGNOSIS — I10 Essential (primary) hypertension: Secondary | ICD-10-CM | POA: Diagnosis not present

## 2021-03-18 DIAGNOSIS — M1631 Unilateral osteoarthritis resulting from hip dysplasia, right hip: Secondary | ICD-10-CM | POA: Diagnosis not present

## 2021-03-18 DIAGNOSIS — Z6841 Body Mass Index (BMI) 40.0 and over, adult: Secondary | ICD-10-CM | POA: Diagnosis not present

## 2021-03-18 DIAGNOSIS — M150 Primary generalized (osteo)arthritis: Secondary | ICD-10-CM | POA: Diagnosis not present

## 2021-03-18 DIAGNOSIS — D649 Anemia, unspecified: Secondary | ICD-10-CM | POA: Diagnosis not present

## 2021-03-18 DIAGNOSIS — G4733 Obstructive sleep apnea (adult) (pediatric): Secondary | ICD-10-CM | POA: Diagnosis not present

## 2021-03-18 DIAGNOSIS — R269 Unspecified abnormalities of gait and mobility: Secondary | ICD-10-CM | POA: Diagnosis not present

## 2021-03-19 DIAGNOSIS — Z7401 Bed confinement status: Secondary | ICD-10-CM | POA: Diagnosis not present

## 2021-03-19 DIAGNOSIS — G4733 Obstructive sleep apnea (adult) (pediatric): Secondary | ICD-10-CM | POA: Diagnosis not present

## 2021-03-21 DIAGNOSIS — M1611 Unilateral primary osteoarthritis, right hip: Secondary | ICD-10-CM | POA: Diagnosis not present

## 2021-03-23 DIAGNOSIS — Z87891 Personal history of nicotine dependence: Secondary | ICD-10-CM | POA: Diagnosis not present

## 2021-03-23 DIAGNOSIS — I10 Essential (primary) hypertension: Secondary | ICD-10-CM | POA: Diagnosis not present

## 2021-03-23 DIAGNOSIS — Z6841 Body Mass Index (BMI) 40.0 and over, adult: Secondary | ICD-10-CM | POA: Diagnosis not present

## 2021-03-23 DIAGNOSIS — D649 Anemia, unspecified: Secondary | ICD-10-CM | POA: Diagnosis not present

## 2021-03-23 DIAGNOSIS — G4733 Obstructive sleep apnea (adult) (pediatric): Secondary | ICD-10-CM | POA: Diagnosis not present

## 2021-03-23 DIAGNOSIS — Z9181 History of falling: Secondary | ICD-10-CM | POA: Diagnosis not present

## 2021-03-23 DIAGNOSIS — M1611 Unilateral primary osteoarthritis, right hip: Secondary | ICD-10-CM | POA: Diagnosis not present

## 2021-03-23 DIAGNOSIS — M150 Primary generalized (osteo)arthritis: Secondary | ICD-10-CM | POA: Diagnosis not present

## 2021-03-23 DIAGNOSIS — K219 Gastro-esophageal reflux disease without esophagitis: Secondary | ICD-10-CM | POA: Diagnosis not present

## 2021-03-24 DIAGNOSIS — M1611 Unilateral primary osteoarthritis, right hip: Secondary | ICD-10-CM | POA: Diagnosis not present

## 2021-03-25 ENCOUNTER — Ambulatory Visit: Payer: Medicare HMO

## 2021-03-25 DIAGNOSIS — K219 Gastro-esophageal reflux disease without esophagitis: Secondary | ICD-10-CM | POA: Diagnosis not present

## 2021-03-25 DIAGNOSIS — M1611 Unilateral primary osteoarthritis, right hip: Secondary | ICD-10-CM | POA: Diagnosis not present

## 2021-03-25 DIAGNOSIS — D649 Anemia, unspecified: Secondary | ICD-10-CM | POA: Diagnosis not present

## 2021-03-25 DIAGNOSIS — I10 Essential (primary) hypertension: Secondary | ICD-10-CM | POA: Diagnosis not present

## 2021-03-25 DIAGNOSIS — Z87891 Personal history of nicotine dependence: Secondary | ICD-10-CM | POA: Diagnosis not present

## 2021-03-25 DIAGNOSIS — Z9181 History of falling: Secondary | ICD-10-CM | POA: Diagnosis not present

## 2021-03-25 DIAGNOSIS — Z6841 Body Mass Index (BMI) 40.0 and over, adult: Secondary | ICD-10-CM | POA: Diagnosis not present

## 2021-03-25 DIAGNOSIS — G4733 Obstructive sleep apnea (adult) (pediatric): Secondary | ICD-10-CM | POA: Diagnosis not present

## 2021-03-25 DIAGNOSIS — M150 Primary generalized (osteo)arthritis: Secondary | ICD-10-CM | POA: Diagnosis not present

## 2021-03-26 DIAGNOSIS — R32 Unspecified urinary incontinence: Secondary | ICD-10-CM | POA: Diagnosis not present

## 2021-03-26 DIAGNOSIS — M1611 Unilateral primary osteoarthritis, right hip: Secondary | ICD-10-CM | POA: Diagnosis not present

## 2021-03-27 DIAGNOSIS — M1611 Unilateral primary osteoarthritis, right hip: Secondary | ICD-10-CM | POA: Diagnosis not present

## 2021-03-29 DIAGNOSIS — M1611 Unilateral primary osteoarthritis, right hip: Secondary | ICD-10-CM | POA: Diagnosis not present

## 2021-03-30 DIAGNOSIS — M1611 Unilateral primary osteoarthritis, right hip: Secondary | ICD-10-CM | POA: Diagnosis not present

## 2021-03-31 ENCOUNTER — Telehealth: Payer: Self-pay | Admitting: *Deleted

## 2021-03-31 DIAGNOSIS — K219 Gastro-esophageal reflux disease without esophagitis: Secondary | ICD-10-CM | POA: Diagnosis not present

## 2021-03-31 DIAGNOSIS — M150 Primary generalized (osteo)arthritis: Secondary | ICD-10-CM | POA: Diagnosis not present

## 2021-03-31 DIAGNOSIS — M1611 Unilateral primary osteoarthritis, right hip: Secondary | ICD-10-CM | POA: Diagnosis not present

## 2021-03-31 DIAGNOSIS — Z87891 Personal history of nicotine dependence: Secondary | ICD-10-CM | POA: Diagnosis not present

## 2021-03-31 DIAGNOSIS — G4733 Obstructive sleep apnea (adult) (pediatric): Secondary | ICD-10-CM | POA: Diagnosis not present

## 2021-03-31 DIAGNOSIS — I10 Essential (primary) hypertension: Secondary | ICD-10-CM | POA: Diagnosis not present

## 2021-03-31 DIAGNOSIS — Z6841 Body Mass Index (BMI) 40.0 and over, adult: Secondary | ICD-10-CM | POA: Diagnosis not present

## 2021-03-31 DIAGNOSIS — Z9181 History of falling: Secondary | ICD-10-CM | POA: Diagnosis not present

## 2021-03-31 DIAGNOSIS — D649 Anemia, unspecified: Secondary | ICD-10-CM | POA: Diagnosis not present

## 2021-03-31 NOTE — Telephone Encounter (Signed)
Please give the order.  Thanks.   

## 2021-03-31 NOTE — Telephone Encounter (Signed)
Jae Dire PT with [facility] left a voicemail stating that she is requesting to continue PT for patient. Jae Dire stated that she is requesting additional visits once a week for 9 weeks. Jae Dire stated that she plans on training caregiver on bed monitoring, bed transfer with hoist when equipment is delivered. Jae Dire stated when calling back a message can be left on her confidentially voicemail.

## 2021-03-31 NOTE — Telephone Encounter (Signed)
Left VM giving Jae Dire the verbal orders

## 2021-04-01 DIAGNOSIS — M1611 Unilateral primary osteoarthritis, right hip: Secondary | ICD-10-CM | POA: Diagnosis not present

## 2021-04-02 DIAGNOSIS — D649 Anemia, unspecified: Secondary | ICD-10-CM | POA: Diagnosis not present

## 2021-04-02 DIAGNOSIS — Z87891 Personal history of nicotine dependence: Secondary | ICD-10-CM | POA: Diagnosis not present

## 2021-04-02 DIAGNOSIS — K219 Gastro-esophageal reflux disease without esophagitis: Secondary | ICD-10-CM | POA: Diagnosis not present

## 2021-04-02 DIAGNOSIS — I10 Essential (primary) hypertension: Secondary | ICD-10-CM | POA: Diagnosis not present

## 2021-04-02 DIAGNOSIS — M1611 Unilateral primary osteoarthritis, right hip: Secondary | ICD-10-CM | POA: Diagnosis not present

## 2021-04-02 DIAGNOSIS — Z9181 History of falling: Secondary | ICD-10-CM | POA: Diagnosis not present

## 2021-04-02 DIAGNOSIS — Z6841 Body Mass Index (BMI) 40.0 and over, adult: Secondary | ICD-10-CM | POA: Diagnosis not present

## 2021-04-02 DIAGNOSIS — M150 Primary generalized (osteo)arthritis: Secondary | ICD-10-CM | POA: Diagnosis not present

## 2021-04-02 DIAGNOSIS — G4733 Obstructive sleep apnea (adult) (pediatric): Secondary | ICD-10-CM | POA: Diagnosis not present

## 2021-04-03 DIAGNOSIS — M1611 Unilateral primary osteoarthritis, right hip: Secondary | ICD-10-CM | POA: Diagnosis not present

## 2021-04-05 ENCOUNTER — Ambulatory Visit (INDEPENDENT_AMBULATORY_CARE_PROVIDER_SITE_OTHER): Payer: Medicare HMO | Admitting: *Deleted

## 2021-04-05 DIAGNOSIS — Z87891 Personal history of nicotine dependence: Secondary | ICD-10-CM | POA: Diagnosis not present

## 2021-04-05 DIAGNOSIS — D649 Anemia, unspecified: Secondary | ICD-10-CM | POA: Diagnosis not present

## 2021-04-05 DIAGNOSIS — Z7401 Bed confinement status: Secondary | ICD-10-CM

## 2021-04-05 DIAGNOSIS — Z6841 Body Mass Index (BMI) 40.0 and over, adult: Secondary | ICD-10-CM | POA: Diagnosis not present

## 2021-04-05 DIAGNOSIS — I10 Essential (primary) hypertension: Secondary | ICD-10-CM | POA: Diagnosis not present

## 2021-04-05 DIAGNOSIS — M150 Primary generalized (osteo)arthritis: Secondary | ICD-10-CM | POA: Diagnosis not present

## 2021-04-05 DIAGNOSIS — G4733 Obstructive sleep apnea (adult) (pediatric): Secondary | ICD-10-CM | POA: Diagnosis not present

## 2021-04-05 DIAGNOSIS — E119 Type 2 diabetes mellitus without complications: Secondary | ICD-10-CM

## 2021-04-05 DIAGNOSIS — Z9181 History of falling: Secondary | ICD-10-CM | POA: Diagnosis not present

## 2021-04-05 DIAGNOSIS — M1611 Unilateral primary osteoarthritis, right hip: Secondary | ICD-10-CM | POA: Diagnosis not present

## 2021-04-05 DIAGNOSIS — K219 Gastro-esophageal reflux disease without esophagitis: Secondary | ICD-10-CM | POA: Diagnosis not present

## 2021-04-05 NOTE — Patient Instructions (Signed)
Visit Information  PATIENT GOALS:  Goals Addressed             This Visit's Progress    Find Help in My Community       Timeframe:  Long-Range Goal Priority:  High Start Date:    03/15/21                         Expected End Date: 06/18/2021                      Follow Up Date 04/21/2021   -follow up with PCS office re: staffing of hours, etc.  -follow up with Bariatric Coordinator for next steps  - begin a notebook of services in my neighborhood or community - call 211 when I need some help - follow-up on any referrals for help I am given - think ahead to make sure my need does not become an emergency - make a note about what I need to have by the phone or take with me, like an identification card or social security number have a back-up plan - have a back-up plan - make a list of family or friends that I can call    Why is this important?   Knowing how and where to find help for yourself or family in your neighborhood and community is an important skill.  You will want to take some steps to learn how.    Notes:         Patient verbalizes understanding of instructions provided today and agrees to view in MyChart.   Telephone follow up appointment with care management team member scheduled for:04/21/21  Reece Levy MSW, LCSW Licensed Clinical Social Worker [facility] [phone]

## 2021-04-05 NOTE — Chronic Care Management (AMB) (Signed)
Chronic Care Management    Clinical Social Work Note  04/05/2021 Name: Michael Payne MRN: [id number] DOB: 03/26/1971  Michael Payne is a 50 y.o. year old male who is a primary care patient of [name], MD. The CCM team was consulted to assist the patient with chronic disease management and/or care coordination needs related to: [employer]  and Mental Health Counseling and Resources.   Engaged with patient by telephone for follow up visit in response to provider referral for social work chronic care management and care coordination services.   Consent to Services:  The patient was given information about Chronic Care Management services, agreed to services, and gave verbal consent prior to initiation of services.  Please see initial visit note for detailed documentation.   Patient agreed to services and consent obtained.   Assessment: Review of patient past medical history, allergies, medications, and health status, including review of relevant consultants reports was performed today as part of a comprehensive evaluation and provision of chronic care management and care coordination services.     SDOH (Social Determinants of Health) assessments and interventions performed:    Advanced Directives Status: Not addressed in this encounter.  CCM Care Plan  Allergies  Allergen Reactions   Aspirin Other (See Comments)    Upset stomach   Lactose Intolerance (Gi)     Outpatient Encounter Medications as of 04/05/2021  Medication Sig   allopurinol (ZYLOPRIM) 300 MG tablet TAKE 1 TABLET TWICE DAILY   amLODipine (NORVASC) 10 MG tablet Take 1 tablet (10 mg total) by mouth daily.   colchicine (COLCRYS) 0.6 MG tablet Take 1 tablet (0.6 mg total) by mouth daily as needed.   diclofenac (VOLTAREN) 75 MG EC tablet TAKE 1 TABLET BY MOUTH 2 TIMES DAILY AS NEEDED. TAKE WITH FOOD.   Dulaglutide (TRULICITY) 0.75 MG/0.5ML SOPN Inject 0.75 mg into the skin once a week.   lidocaine  (LIDODERM) 5 % PLACE 1 PATCH ONTO THE SKIN DAILY. REMOVE & DISCARD PATCH WITHIN 12 HOURS   naloxone (NARCAN) nasal spray 4 mg/0.1 mL Spray nostril if needed for opioid overuse- decreased consciousness, decreased respirations   oxyCODONE-acetaminophen (PERCOCET) 10-325 MG tablet Take 1 tablet by mouth every 6 (six) hours as needed for pain.   tiZANidine (ZANAFLEX) 4 MG tablet Take 1 tablet (4 mg total) by mouth every 8 (eight) hours as needed for muscle spasms.   vitamin C (ASCORBIC ACID) 500 MG tablet Take 500 mg by mouth daily.   No facility-administered encounter medications on file as of 04/05/2021.    Patient Active Problem List   Diagnosis Date Noted   Gout, unspecified 03/10/2021   Edema 03/10/2021   Diabetes mellitus without complication (HCC) 03/10/2021   Muscle spasms of both lower extremities 07/29/2020   Bedbound 05/22/2020   Unilateral osteoarthritis of hip, right 12/27/2019   Severe obstructive sleep apnea 02/22/2018   Hypertriglyceridemia 08/24/2017   Low HDL (under 40) 08/24/2017   Metabolic syndrome 08/24/2017   Morbid obesity (HCC) 10/14/2013   HTN (hypertension) 10/14/2013    Conditions to be addressed/monitored: HTN, DMII, and bariatric needs ; Mental Health Concerns  and Lacks knowledge of community resources  Care Plan : Development of Plan for Management of Functional Limitations and Needs  Updates made by Buck Mam, LCSW since 04/05/2021 12:00 AM     Problem: Social and Functional Symptoms      Long-Range Goal: Optimize pt's quality of life and opportunities   Start Date: 01/25/2021  Expected  End Date: 06/18/2021  This Visit's Progress: On track  Recent Progress: On track  Priority: High  Note:   Current barriers:   Transportation, Housing barriers, Level of care concerns, ADL IADL limitations, Social Isolation, Limited access to caregiver, Inability to perform ADL's independently, Inability to perform IADL's independently, and Lacks knowledge of  community resource Clinical Goals: Patient will work with CSW  to address needs related to transportation, PCS/CAPS and other needs as assessed and identified Clinical Interventions:  CSW spoke with pt who reports he is doing his exercises this morning- he also shared that the [facility] rep from [place] came and he has been approved for 80 hours monthly for personal care. He is having difficulties with the provider with consistency/reliability due to staffing, etc. CSW suggested he discuss this with the [facility] office if desired to seek other provider options.  Pt also awaiting word about his appointment at [facility]- noted the Provider will come to ambulance at time of visit to meet with pt. Pt provided with # to call and inquire about the status of his completion of Seminar which has to be done prior to visit being scheduled. CSW also reminded pt that his PCP office will have to initiate another "Network waiver" through his insurance company by calling [phone] and completing necessary steps for EMS authorized two-way nonemergent EMS rides.  He also voiced interest in counseling support and is agreeable to referral being made to [facility] for arrangements to be made with an Engineer, manufacturing.    Collaboration with Joaquim Nam, MD regarding development and update of comprehensive plan of care as evidenced by provider attestation and co-signature Inter-disciplinary care team collaboration (see longitudinal plan of care) Assessment of needs, barriers , agencies contacted, as well as how impacting  Review various resources, discussed options and provided patient information about Department of Social Services (food stamps, Medicaid, and utilities assistance), Transportation provided by insurance provider, Enhanced Benefits connected with insurance provider, Referral to care guide for community support and other options, SCAT transportation, and [employer] Alternative Program CAP Patient interviewed  and appropriate assessments performed Referred patient to community resources care guide team for assistance with ramps (building/ex[expense,etc)  Provided mental health counseling with regard to pt's current isolation and being homebound/bedbound Provided patient with information about SCAT, CAPS, transportation resources Discussed plans with patient for ongoing care management follow up and provided patient with direct contact information for care management team Advised patient to call DSS worker to request CAPS services Advised patient to complete PCS application/request for increase in hours provided Collaborated with primary care provider re: insurance prior auth process for stretcher transportation Assisted patient/caregiver with obtaining information about health plan benefits Other interventions provided:Solution-Focused Strategies, Active listening / Reflection utilized , Emotional Supportive Provided, Problem Solving /Task Center , Psychoeducation /Health Education, Motivational Interviewing, Brief CBT , Increase in activities / exercise encouraged (with HHPT), and Provided basic mental health support, education   Patient Goals/Self-Care Activities: Over the next 60 days  Follow Up Date 04/21/21  -expect call from [facility] to schedule/arrange initial virtual  counseling - keep 90 percent of counseling appointments - schedule counseling appointment  -follow up with PCS office re: staffing of hours, etc.  -follow up with Bariatric Coordinator for next steps  - begin a notebook of services in my neighborhood or community - call 211 when I need some help - follow-up on any referrals for help I am given - think ahead to make sure my need does not become an  emergency - make a note about what I need to have by the phone or take with me, like an identification card or social security number have a back-up plan - have a back-up plan - make a list of family or friends that I can call -continue  working with Home Health PT      Follow Up Plan: Appointment scheduled for SW follow up with client by phone on: 04/21/21      Reece Levy MSW, LCSW Licensed Clinical Social Worker [facility] [place] [phone]

## 2021-04-06 ENCOUNTER — Ambulatory Visit (INDEPENDENT_AMBULATORY_CARE_PROVIDER_SITE_OTHER): Payer: Medicare HMO

## 2021-04-06 DIAGNOSIS — G4733 Obstructive sleep apnea (adult) (pediatric): Secondary | ICD-10-CM | POA: Diagnosis not present

## 2021-04-06 DIAGNOSIS — M1611 Unilateral primary osteoarthritis, right hip: Secondary | ICD-10-CM | POA: Diagnosis not present

## 2021-04-06 DIAGNOSIS — K219 Gastro-esophageal reflux disease without esophagitis: Secondary | ICD-10-CM | POA: Diagnosis not present

## 2021-04-06 DIAGNOSIS — Z0000 Encounter for general adult medical examination without abnormal findings: Secondary | ICD-10-CM | POA: Diagnosis not present

## 2021-04-06 DIAGNOSIS — I10 Essential (primary) hypertension: Secondary | ICD-10-CM | POA: Diagnosis not present

## 2021-04-06 DIAGNOSIS — M150 Primary generalized (osteo)arthritis: Secondary | ICD-10-CM | POA: Diagnosis not present

## 2021-04-06 DIAGNOSIS — D649 Anemia, unspecified: Secondary | ICD-10-CM | POA: Diagnosis not present

## 2021-04-06 DIAGNOSIS — Z9181 History of falling: Secondary | ICD-10-CM | POA: Diagnosis not present

## 2021-04-06 DIAGNOSIS — Z87891 Personal history of nicotine dependence: Secondary | ICD-10-CM | POA: Diagnosis not present

## 2021-04-06 DIAGNOSIS — Z6841 Body Mass Index (BMI) 40.0 and over, adult: Secondary | ICD-10-CM | POA: Diagnosis not present

## 2021-04-06 NOTE — Patient Instructions (Signed)
Michael Payne , Thank you for taking time to come for your Medicare Wellness Visit. I appreciate your ongoing commitment to your health goals. Please review the following plan we discussed and let me know if I can assist you in the future.   Screening recommendations/referrals: Colonoscopy: due, will discuss with provider at next visit Recommended yearly ophthalmology/optometry visit for glaucoma screening and checkup Recommended yearly dental visit for hygiene and checkup  Vaccinations: Influenza vaccine: due Fall 2022 Pneumococcal vaccine: due at age 67  Tdap vaccine: Up to date, completed 02/03/2012, due 01/2022 Shingles vaccine: due at age 72   Covid-19: declined  Advanced directives: Advance directive discussed with you today. Even though you declined this today please call our office should you change your mind and we can give you the proper paperwork for you to fill out.  Conditions/risks identified: diabetes, hypertension  Next appointment: Follow up in one year for your annual wellness visit.   Preventive Care 23-49 Years old, Male Preventive care refers to lifestyle choices and visits with your health care provider that can promote health and wellness. What does preventive care include? A yearly physical exam. This is also called an annual well check. Dental exams once or twice a year. Routine eye exams. Ask your health care provider how often you should have your eyes checked. Personal lifestyle choices, including: Daily care of your teeth and gums. Regular physical activity. Eating a healthy diet. Avoiding tobacco and drug use. Limiting alcohol use. Practicing safe sex. Taking low doses of aspirin every day. Taking vitamin and mineral supplements as recommended by your health care provider. What happens during an annual well check? The services and screenings done by your health care provider during your annual well check will depend on your age, overall health, lifestyle  risk factors, and family history of disease. Counseling  Your health care provider may ask you questions about your: Alcohol use. Tobacco use. Drug use. Emotional well-being. Home and relationship well-being. Sexual activity. Eating habits. History of falls. Memory and ability to understand (cognition). Work and work Astronomer. Screening  You may have the following tests or measurements: Height, weight, and BMI. Blood pressure. Lipid and cholesterol levels. These may be checked every 5 years, or more frequently if you are over 42 years old. Skin check. Lung cancer screening. You may have this screening every year starting at age 76 if you have a 30-pack-year history of smoking and currently smoke or have quit within the past 15 years. Fecal occult blood test (FOBT) of the stool. You may have this test every year starting at age 6. Flexible sigmoidoscopy or colonoscopy. You may have a sigmoidoscopy every 5 years or a colonoscopy every 10 years starting at age 54. Prostate cancer screening. Recommendations will vary depending on your family history and other risks. Hepatitis C blood test. Hepatitis B blood test. Sexually transmitted disease (STD) testing. Diabetes screening. This is done by checking your blood sugar (glucose) after you have not eaten for a while (fasting). You may have this done every 1-3 years. Abdominal aortic aneurysm (AAA) screening. You may need this if you are a current or former smoker. Osteoporosis. You may be screened starting at age 70 if you are at high risk. Talk with your health care provider about your test results, treatment options, and if necessary, the need for more tests. Vaccines  Your health care provider may recommend certain vaccines, such as: Influenza vaccine. This is recommended every year. Tetanus, diphtheria, and acellular pertussis (Tdap,  Td) vaccine. You may need a Td booster every 10 years. Zoster vaccine. You may need this after age  53. Pneumococcal 13-valent conjugate (PCV13) vaccine. One dose is recommended after age 31. Pneumococcal polysaccharide (PPSV23) vaccine. One dose is recommended after age 79. Talk to your health care provider about which screenings and vaccines you need and how often you need them. This information is not intended to replace advice given to you by your health care provider. Make sure you discuss any questions you have with your health care provider. Document Released: 10/02/2015 Document Revised: 05/25/2016 Document Reviewed: 07/07/2015 Elsevier Interactive Patient Education  2017 [place] Prevention in the Home Falls can cause injuries. They can happen to people of all ages. There are many things you can do to make your home safe and to help prevent falls. What can I do on the outside of my home? Regularly fix the edges of walkways and driveways and fix any cracks. Remove anything that might make you trip as you walk through a door, such as a raised step or threshold. Trim any bushes or trees on the path to your home. Use bright outdoor lighting. Clear any walking paths of anything that might make someone trip, such as rocks or tools. Regularly check to see if handrails are loose or broken. Make sure that both sides of any steps have handrails. Any raised decks and porches should have guardrails on the edges. Have any leaves, snow, or ice cleared regularly. Use sand or salt on walking paths during winter. Clean up any spills in your garage right away. This includes oil or grease spills. What can I do in the bathroom? Use night lights. Install grab bars by the toilet and in the tub and shower. Do not use towel bars as grab bars. Use non-skid mats or decals in the tub or shower. If you need to sit down in the shower, use a plastic, non-slip stool. Keep the floor dry. Clean up any water that spills on the floor as soon as it happens. Remove soap buildup in the tub or shower  regularly. Attach bath mats securely with double-sided non-slip rug tape. Do not have throw rugs and other things on the floor that can make you trip. What can I do in the bedroom? Use night lights. Make sure that you have a light by your bed that is easy to reach. Do not use any sheets or blankets that are too big for your bed. They should not hang down onto the floor. Have a firm chair that has side arms. You can use this for support while you get dressed. Do not have throw rugs and other things on the floor that can make you trip. What can I do in the kitchen? Clean up any spills right away. Avoid walking on wet floors. Keep items that you use a lot in easy-to-reach places. If you need to reach something above you, use a strong step stool that has a grab bar. Keep electrical cords out of the way. Do not use floor polish or wax that makes floors slippery. If you must use wax, use non-skid floor wax. Do not have throw rugs and other things on the floor that can make you trip. What can I do with my stairs? Do not leave any items on the stairs. Make sure that there are handrails on both sides of the stairs and use them. Fix handrails that are broken or loose. Make sure that handrails are as  long as the stairways. Check any carpeting to make sure that it is firmly attached to the stairs. Fix any carpet that is loose or worn. Avoid having throw rugs at the top or bottom of the stairs. If you do have throw rugs, attach them to the floor with carpet tape. Make sure that you have a light switch at the top of the stairs and the bottom of the stairs. If you do not have them, ask someone to add them for you. What else can I do to help prevent falls? Wear shoes that: Do not have high heels. Have rubber bottoms. Are comfortable and fit you well. Are closed at the toe. Do not wear sandals. If you use a stepladder: Make sure that it is fully opened. Do not climb a closed stepladder. Make sure that  both sides of the stepladder are locked into place. Ask someone to hold it for you, if possible. Clearly mark and make sure that you can see: Any grab bars or handrails. First and last steps. Where the edge of each step is. Use tools that help you move around (mobility aids) if they are needed. These include: Canes. Walkers. Scooters. Crutches. Turn on the lights when you go into a dark area. Replace any light bulbs as soon as they burn out. Set up your furniture so you have a clear path. Avoid moving your furniture around. If any of your floors are uneven, fix them. If there are any pets around you, be aware of where they are. Review your medicines with your doctor. Some medicines can make you feel dizzy. This can increase your chance of falling. Ask your doctor what other things that you can do to help prevent falls. This information is not intended to replace advice given to you by your health care provider. Make sure you discuss any questions you have with your health care provider. Document Released: 07/02/2009 Document Revised: 02/11/2016 Document Reviewed: 10/10/2014 Elsevier Interactive Patient Education  2017 [employer]   Opioid Pain Medicine Management Opioid pain medicines are strong medicines that are used to treat bad or very bad pain. When you take them for a short time, they can help you: Sleep better. Do better in physical therapy. Feel better during the first few days after you get hurt. Recover from surgery. Only take these medicines if a doctor says that you can. You should only take them for a short time. This is because opioids can be hard to stop taking (they are addictive). The longer you take opioids, the harder it may be to stop taking them (opioid use disorder). What are the risks? Opioids can cause problems (side effects). Taking them for more than 3 days raises your chance of problems, such as: Trouble pooping (constipation). Feeling sick to your stomach  (nausea). Vomiting. Feeling very sleepy. Confusion. Not being able to stop taking the medicine. Breathing problems. Taking opioids for a long time can make it hard for you to do daily tasks. It can also put you at risk for: Car accidents. Depression. Suicide. Heart attack. Taking too much of the medicine (overdose), which can sometimes lead to death. What is a pain treatment plan? A pain treatment plan is a plan made by you and your doctor. Work with your doctor to make a plan for treating your pain. To help you do this: Talk about the goals of your treatment, including: How much pain you might expect to have. How you will manage the pain. Talk about the  risks and benefits of taking these medicines for your condition. Remember that a good treatment plan uses more than one approach and lowers the risks of side effects. Tell your doctor about the amount of medicines you take and about any drug or alcohol use. Get your pain medicine prescriptions from only one doctor. Pain can be managed with other treatments. Work with your doctor to find other ways to help your pain, such as: Physical therapy. Counseling. Eating healthy foods. Brain exercises. Massage. Meditation. Other pain medicines. Doing gentle exercises. Tapering your use of opioids If you have been taking opioids for more than a few weeks, you may need to slowly decrease (taper) how much you take until you stop taking them. Doing this can lower your chance of having symptoms, such as: Pain and cramping in your belly (abdomen). Feeling sick to your stomach. Sweating. Feeling very sleepy. Feeling restless. Shaking you cannot control (tremors). Cravings for the medicine. Do not try to stop taking them by yourself. Work with your doctor to stop. Your doctorwill help you take less until you are not taking the medicine at all. Follow these instructions at home: Safety and storage  While you are taking opioids: Do not  drive. Do not use machines or power tools. Do not sign important papers (legal documents). Do not drink alcohol. Do not take sleeping pills. Do not take care of children by yourself. Do not do activities where you need to climb or be in high places, like working on a ladder. Do not go into any water, such as a lake, river, ocean, swimming pool, or hot tub. Keep your opioids locked up or in a place where children cannot reach them. Do not share your pain medicine with anyone.  Getting rid of leftover pills Do not save any leftover pills. Get rid of leftover pills safely by: Taking them to a take-back program in your area. Bringing them to a pharmacy that has a container for throwing away pills (pill disposal). Flushing them down the toilet. Check the label or package insert of your medicine to see whether this is safe to do. Throwing them in the trash. Check the label or package insert of your medicine to see whether this is safe to do. If it is safe to throw them out: Take the pills out of their container. Put the pills into a container you can seal. Mix the pills with used coffee grounds, food scraps, dirt, or cat litter. Put this in the trash. Activity Return to your normal activities as told by your doctor. Ask your doctor what activities are safe for you. Avoid doing things that make your pain worse. Do exercises as told by your doctor. General instructions You may need to take these actions to prevent or treat trouble pooping: Drink enough fluid to keep your pee (urine) pale yellow. Take over-the-counter or prescription medicines. Eat foods that are high in fiber. These include beans, whole grains, and fresh fruits and vegetables. Limit foods that are high in fat and sugar. These include fried or sweet foods. Keep all follow-up visits as told by your doctor. This is important. Where to find support If you have been taking opioids for a long time, think about getting helpquitting  from a local support group or counselor. Ask your doctor about this. Where to find more information Centers for Disease Control and Prevention (CDC): [employer] [place] Food and Drug Administration (FDA): PumpkinSearch.com.ee Get help right away if: Seek medical care right away if you  are taking opioids and you, or people close to you, notice any of the following: You have trouble breathing. Your breathing is slower or more shallow than normal. You have a very slow heartbeat. You feel very confused. You pass out (faint). You are very sleepy. Your speech is not normal. You feel sick to your stomach and vomit. You have cold skin. You have blue lips or fingernails. Your muscles are weak (limp) and your body seems floppy. The black centers of your eyes (pupils) are smaller than normal. If you think that you or someone else may have taken too much of an opioid medicine, get medical help right away. Call your local emergency services (911 in the [place] Do not drive yourself to the hospital. If you ever feel like you may hurt yourself or others, or have thoughts about taking your own life, get help right away. You can go to your nearest emergency department or call: Your local emergency services (911 in the [place] The hotline of the [facility] 629-658-5044 in the [place] A suicide crisis helpline, such as the National Suicide Prevention Lifeline at (321)035-8892. This is open 24 hours a day. Summary Opioid are strong medicines that are used to treat bad or very bad pain. A pain treatment plan is a plan made by you and your doctor. Work with your doctor to make a plan for treating your pain. Work with your doctor to find other ways to help your pain. If you think that you or someone else may have taken too much of an opioid, get help right away. This information is not intended to replace advice given to you by your health care provider. Make sure you discuss any questions you have  with your healthcare provider. Document Revised: 08/28/2020 Document Reviewed: 10/05/2018 Elsevier Patient Education  2022 [employer].

## 2021-04-06 NOTE — Progress Notes (Signed)
Subjective:   Michael Payne is a 50 y.o. male who presents for Medicare Annual/Subsequent preventive examination.  Review of Systems: N/A      I connected with the patient today by telephone and verified that I am speaking with the correct person using two identifiers. Location patient: home Location nurse: work Persons participating in the telephone visit: patient, nurse.   I discussed the limitations, risks, security and privacy concerns of performing an evaluation and management service by telephone and the availability of in person appointments. I also discussed with the patient that there may be a patient responsible charge related to this service. The patient expressed understanding and verbally consented to this telephonic visit.        Cardiac Risk Factors include: diabetes mellitus;male gender;hypertension;sedentary lifestyle     Objective:    Today's Vitals   04/06/21 1022  PainSc: 6    There is no height or weight on file to calculate BMI.  Advanced Directives 04/06/2021 02/11/2021 03/24/2020 12/27/2019 07/24/2015 12/08/2014 09/30/2014  Does Patient Have a Medical Advance Directive? No No No No No No No  Does patient want to make changes to medical advance directive? No - Patient declined - - - - - -  Would patient like information on creating a medical advance directive? - Yes (MAU/Ambulatory/Procedural Areas - Information given) No - Patient declined No - Patient declined No - patient declined information Yes - Educational materials given -    Current Medications (verified) Outpatient Encounter Medications as of 04/06/2021  Medication Sig   allopurinol (ZYLOPRIM) 300 MG tablet TAKE 1 TABLET TWICE DAILY   amLODipine (NORVASC) 10 MG tablet Take 1 tablet (10 mg total) by mouth daily.   colchicine (COLCRYS) 0.6 MG tablet Take 1 tablet (0.6 mg total) by mouth daily as needed.   diclofenac (VOLTAREN) 75 MG EC tablet TAKE 1 TABLET BY MOUTH 2 TIMES DAILY AS NEEDED. TAKE WITH  FOOD.   Dulaglutide (TRULICITY) 0.75 MG/0.5ML SOPN Inject 0.75 mg into the skin once a week.   lidocaine (LIDODERM) 5 % PLACE 1 PATCH ONTO THE SKIN DAILY. REMOVE & DISCARD PATCH WITHIN 12 HOURS   naloxone (NARCAN) nasal spray 4 mg/0.1 mL Spray nostril if needed for opioid overuse- decreased consciousness, decreased respirations   oxyCODONE-acetaminophen (PERCOCET) 10-325 MG tablet Take 1 tablet by mouth every 6 (six) hours as needed for pain.   tiZANidine (ZANAFLEX) 4 MG tablet Take 1 tablet (4 mg total) by mouth every 8 (eight) hours as needed for muscle spasms.   vitamin C (ASCORBIC ACID) 500 MG tablet Take 500 mg by mouth daily.   No facility-administered encounter medications on file as of 04/06/2021.    Allergies (verified) Aspirin and Lactose intolerance (gi)   History: Past Medical History:  Diagnosis Date   Anemia    borderline anemia, was instructed to eat more iron filled foods   GERD (gastroesophageal reflux disease)    lactose intolerance   Hip pain    HTN (hypertension)    Obesity    Sleep apnea    Past Surgical History:  Procedure Laterality Date   FOOT SURGERY     KNEE ARTHROSCOPY WITH MEDIAL MENISECTOMY Left 12/16/2014   Procedure: KNEE ARTHROSCOPY WITH MEDIAL MENISECTOMY;  Surgeon: Sheral Apley, MD;  Location: [facility] OR;  Service: Orthopedics;  Laterality: Left;   Family History  Problem Relation Age of Onset   Heart attack Mother 58   Lung cancer Father    Cancer Father  CAD Maternal Grandfather    Social History   Socioeconomic History   Marital status: Married    Spouse name: Not on file   Number of children: 2   Years of education: Not on file   Highest education level: Not on file  Occupational History   Occupation: disabled  Tobacco Use   Smoking status: Former    Packs/day: 2.00    Years: 22.00    Pack years: 44.00    Types: Cigarettes    Quit date: 02/24/2011    Years since quitting: 10.1   Smokeless tobacco: Never  Vaping Use   Vaping  Use: Never used  Substance and Sexual Activity   Alcohol use: Not Currently    Comment: Weekends/beer-- has slowed down  alot   Drug use: Not Currently    Types: Marijuana    Comment: former -  quit in 2012   Sexual activity: Yes    Partners: Female  Other Topics Concern   Not on file  Social History Narrative   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   Former Hydrologist.   Married.   Social Determinants of Health   Financial Resource Strain: Low Risk    Difficulty of Paying Living Expenses: Not hard at all  Food Insecurity: No Food Insecurity   Worried About Programme researcher, broadcasting/film/video in the Last Year: Never true   Ran Out of Food in the Last Year: Never true  Transportation Needs: No Transportation Needs   Lack of Transportation (Medical): No   Lack of Transportation (Non-Medical): No  Physical Activity: Inactive   Days of Exercise per Week: 0 days   Minutes of Exercise per Session: 0 min  Stress: No Stress Concern Present   Feeling of Stress : Not at all  Social Connections: Not on file    Tobacco Counseling Counseling given: Not Answered   Clinical Intake:  Pre-visit preparation completed: Yes  Pain : 0-10 Pain Score: 6  Pain Type: Chronic pain Pain Descriptors / Indicators: Aching Pain Onset: More than a month ago Pain Frequency: Intermittent     Nutritional Risks: None Diabetes: Yes CBG done?: No Did pt. bring in CBG monitor from home?: No  How often do you need to have someone help you when you read instructions, pamphlets, or other written materials from your doctor or pharmacy?: 1 - Never  Diabetic: Yes Nutrition Risk Assessment:  Has the patient had any N/V/D within the last 2 months?  No  Does the patient have any non-healing wounds?  No  Has the patient had any unintentional weight loss or weight gain?  No   Diabetes:  Is the patient diabetic?  Yes  If diabetic, was a CBG obtained today?  No  telephone visit  Did the patient  bring in their glucometer from home?  No  telephone visit  How often do you monitor your CBG's? never.   Financial Strains and Diabetes Management:  Are you having any financial strains with the device, your supplies or your medication? No .  Does the patient want to be seen by Chronic Care Management for management of their diabetes?  No  Would the patient like to be referred to a Nutritionist or for Diabetic Management?  No   Diabetic Exams:  Diabetic Eye Exam: Overdue for diabetic eye exam. Pt has been advised about the importance in completing this exam. Patient advised to call and schedule an eye exam. Diabetic Foot Exam: Overdue, Pt has been  advised about the importance in completing this exam.  Interpreter Needed?: No  Information entered by :: CJohnson, RN   Activities of Daily Living In your present state of health, do you have any difficulty performing the following activities: 04/06/2021 02/11/2021  Hearing? N N  Vision? N N  Difficulty concentrating or making decisions? N N  Walking or climbing stairs? Y Y  Dressing or bathing? Y Y  Doing errands, shopping? Malvin Johns  Preparing Food and eating ? Y Y  Using the Toilet? Y Y  In the past six months, have you accidently leaked urine? N N  Do you have problems with loss of bowel control? N N  Managing your Medications? Y N  Managing your Finances? Y N  Housekeeping or managing your Housekeeping? Malvin Johns  Some recent data might be hidden    Patient Care Team: Joaquim Nam, MD as PCP - General (Family Medicine) Buck Mam, LCSW as Social Worker (Licensed Clinical Social Worker) Chilton Si, Loney Loh, RN as Case Manager Coralyn Helling, MD as Consulting Physician (Pulmonary Disease) [name], Hyman Hopes, MD as Consulting Physician (Surgery)  Indicate any recent Medical Services you may have received from other than [facility] providers in the past year (date may be approximate).     Assessment:   This is a routine wellness  examination for [place].  Hearing/Vision screen Vision Screening - Comments:: Patient gets annual eye exams  Dietary issues and exercise activities discussed: Current Exercise Habits: The patient does not participate in regular exercise at present, Exercise limited by: Other - see comments   Goals Addressed             This Visit's Progress    Patient Stated       04/06/2021, I will maintain and continue medications as prescribed.        Depression Screen PHQ 2/9 Scores 04/06/2021 02/11/2021 09/02/2020 07/29/2020 03/24/2020 05/27/2015 02/18/2015  PHQ - 2 Score 0 0 0 2 0 0 0  PHQ- 9 Score 0 - - 6 0 - -    Fall Risk Fall Risk  04/06/2021 02/11/2021 09/02/2020 07/29/2020 03/24/2020  Falls in the past year? 0 0 0 Exclusion - non ambulatory 1  Comment - - - has not fallen in the past year and is bedbound -  Number falls in past yr: 0 0 0 - 1  Injury with Fall? 0 - 0 - 0  Risk for fall due to : Medication side effect;Impaired balance/gait;Impaired mobility - - Impaired mobility History of fall(s);Impaired balance/gait;Medication side effect;Impaired mobility  Follow up Falls evaluation completed;Falls prevention discussed - Falls evaluation completed - Falls evaluation completed;Falls prevention discussed    FALL RISK PREVENTION PERTAINING TO THE HOME:  Any stairs in or around the home? Yes  If so, are there any without handrails? No  Home free of loose throw rugs in walkways, pet beds, electrical cords, etc? Yes  Adequate lighting in your home to reduce risk of falls? Yes   ASSISTIVE DEVICES UTILIZED TO PREVENT FALLS:  Life alert? No  Use of a cane, walker or w/c? Yes  Grab bars in the bathroom? No  Shower chair or bench in shower? No  Elevated toilet seat or a handicapped toilet? No   TIMED UP AND GO:  Was the test performed?  N/A telephone visit .    Cognitive Function: MMSE - Mini Mental State Exam 04/06/2021 03/24/2020  Not completed: Refused Refused  Mini Cog  Mini-Cog  screen was completed. Maximum  score is 22. A value of 0 denotes this part of the MMSE was not completed or the patient failed this part of the Mini-Cog screening.       Immunizations Immunization History  Administered Date(s) Administered   Tdap 02/03/2012    TDAP status: Up to date  Flu Vaccine status: due Fall 2022  Pneumococcal vaccine status: due at age 74   Covid-19 vaccine status: Declined, Education has been provided regarding the importance of this vaccine but patient still declined. Advised may receive this vaccine at local pharmacy or Health Dept.or vaccine clinic. Aware to provide a copy of the vaccination record if obtained from local pharmacy or Health Dept. Verbalized acceptance and understanding.  Qualifies for Shingles Vaccine? No  due at age 51 Zostavax completed No   Shingrix Completed?: No.   Screening Tests Health Maintenance  Topic Date Due   PNEUMOCOCCAL POLYSACCHARIDE VACCINE AGE 70-64 HIGH RISK  Never done   FOOT EXAM  Never done   OPHTHALMOLOGY EXAM  Never done   URINE MICROALBUMIN  Never done   Hepatitis C Screening  Never done   COLONOSCOPY (Pts 45-24yrs Insurance coverage will need to be confirmed)  Never done   COVID-19 Vaccine (1) 04/22/2021 (Originally 09/20/1975)   HEMOGLOBIN A1C  09/08/2021   TETANUS/TDAP  02/02/2022   HIV Screening  Completed   Pneumococcal Vaccine 43-78 Years old  Aged Out   HPV VACCINES  Aged Out   INFLUENZA VACCINE  Discontinued    Health Maintenance  Health Maintenance Due  Topic Date Due   PNEUMOCOCCAL POLYSACCHARIDE VACCINE AGE 70-64 HIGH RISK  Never done   FOOT EXAM  Never done   OPHTHALMOLOGY EXAM  Never done   URINE MICROALBUMIN  Never done   Hepatitis C Screening  Never done   COLONOSCOPY (Pts 45-29yrs Insurance coverage will need to be confirmed)  Never done    Colorectal cancer screening: due, will discuss with provider   Lung Cancer Screening: (Low Dose CT Chest recommended if Age 71-80 years, 30  pack-year currently smoking OR have quit w/in 15years.) does not qualify.  Additional Screening:  Hepatitis C Screening: does qualify; Completed due  Vision Screening: Recommended annual ophthalmology exams for early detection of glaucoma and other disorders of the eye. Is the patient up to date with their annual eye exam?  Yes  Who is the provider or what is the name of the office in which the patient attends annual eye exams? [facility] If pt is not established with a provider, would they like to be referred to a provider to establish care? No .   Dental Screening: Recommended annual dental exams for proper oral hygiene  Community Resource Referral / Chronic Care Management: CRR required this visit?  No   CCM required this visit?  No      Plan:     I have personally reviewed and noted the following in the patient's chart:   Medical and social history Use of alcohol, tobacco or illicit drugs  Current medications and supplements including opioid prescriptions. Patient is currently taking opioid prescriptions. Information provided to patient regarding non-opioid alternatives. Patient advised to discuss non-opioid treatment plan with their provider. Functional ability and status Nutritional status Physical activity Advanced directives List of other physicians Hospitalizations, surgeries, and ER visits in previous 12 months Vitals Screenings to include cognitive, depression, and falls Referrals and appointments  In addition, I have reviewed and discussed with patient certain preventive protocols, quality metrics, and best practice recommendations. A  written personalized care plan for preventive services as well as general preventive health recommendations were provided to patient.   Due to this being a telephonic visit, the after visit summary with patients personalized plan was offered to patient via office or my-chart. Patient preferred to pick up at office at next visit or  via mychart.   Janalyn Shy, LPN   [phone]

## 2021-04-06 NOTE — Progress Notes (Signed)
PCP notes:  Health Maintenance: Colonoscopy- due Eye exam- due Foot exam- due Covid- declined    Abnormal Screenings: none   Patient concerns: none   Nurse concerns: none   Next PCP appt.: none

## 2021-04-07 ENCOUNTER — Telehealth: Payer: Self-pay | Admitting: Family Medicine

## 2021-04-07 DIAGNOSIS — M1611 Unilateral primary osteoarthritis, right hip: Secondary | ICD-10-CM

## 2021-04-07 MED ORDER — OXYCODONE-ACETAMINOPHEN 10-325 MG PO TABS: 1.0000 | ORAL_TABLET | Freq: Four times a day (QID) | ORAL | 0 refills | Status: DC | PRN

## 2021-04-07 NOTE — Telephone Encounter (Signed)
LOV - 03/09/21 NOV - not scheduled Last refilled - 03/09/21 #120/0

## 2021-04-07 NOTE — Telephone Encounter (Signed)
Sent. Thanks.   

## 2021-04-07 NOTE — Telephone Encounter (Signed)
Pt called needing a refill on oxyCODONE-acetaminophen (PERCOCET) 10-325 MG tablet sent to [facility]

## 2021-04-07 NOTE — Addendum Note (Signed)
Addended by: Joaquim Nam on: 04/07/2021 10:04 PM   Modules accepted: Orders

## 2021-04-08 DIAGNOSIS — Z6841 Body Mass Index (BMI) 40.0 and over, adult: Secondary | ICD-10-CM | POA: Diagnosis not present

## 2021-04-08 DIAGNOSIS — M150 Primary generalized (osteo)arthritis: Secondary | ICD-10-CM | POA: Diagnosis not present

## 2021-04-08 DIAGNOSIS — I10 Essential (primary) hypertension: Secondary | ICD-10-CM | POA: Diagnosis not present

## 2021-04-08 DIAGNOSIS — G4733 Obstructive sleep apnea (adult) (pediatric): Secondary | ICD-10-CM | POA: Diagnosis not present

## 2021-04-08 DIAGNOSIS — Z87891 Personal history of nicotine dependence: Secondary | ICD-10-CM | POA: Diagnosis not present

## 2021-04-08 DIAGNOSIS — D649 Anemia, unspecified: Secondary | ICD-10-CM | POA: Diagnosis not present

## 2021-04-08 DIAGNOSIS — M1611 Unilateral primary osteoarthritis, right hip: Secondary | ICD-10-CM | POA: Diagnosis not present

## 2021-04-08 DIAGNOSIS — K219 Gastro-esophageal reflux disease without esophagitis: Secondary | ICD-10-CM | POA: Diagnosis not present

## 2021-04-08 DIAGNOSIS — Z9181 History of falling: Secondary | ICD-10-CM | POA: Diagnosis not present

## 2021-04-09 DIAGNOSIS — M1611 Unilateral primary osteoarthritis, right hip: Secondary | ICD-10-CM | POA: Diagnosis not present

## 2021-04-10 DIAGNOSIS — M6281 Muscle weakness (generalized): Secondary | ICD-10-CM | POA: Diagnosis not present

## 2021-04-10 DIAGNOSIS — M1611 Unilateral primary osteoarthritis, right hip: Secondary | ICD-10-CM | POA: Diagnosis not present

## 2021-04-12 DIAGNOSIS — M1611 Unilateral primary osteoarthritis, right hip: Secondary | ICD-10-CM | POA: Diagnosis not present

## 2021-04-13 ENCOUNTER — Ambulatory Visit: Payer: Medicare HMO

## 2021-04-13 ENCOUNTER — Telehealth: Payer: Medicare HMO

## 2021-04-13 DIAGNOSIS — G4733 Obstructive sleep apnea (adult) (pediatric): Secondary | ICD-10-CM | POA: Diagnosis not present

## 2021-04-13 DIAGNOSIS — K219 Gastro-esophageal reflux disease without esophagitis: Secondary | ICD-10-CM | POA: Diagnosis not present

## 2021-04-13 DIAGNOSIS — D649 Anemia, unspecified: Secondary | ICD-10-CM | POA: Diagnosis not present

## 2021-04-13 DIAGNOSIS — Z87891 Personal history of nicotine dependence: Secondary | ICD-10-CM | POA: Diagnosis not present

## 2021-04-13 DIAGNOSIS — M150 Primary generalized (osteo)arthritis: Secondary | ICD-10-CM | POA: Diagnosis not present

## 2021-04-13 DIAGNOSIS — E119 Type 2 diabetes mellitus without complications: Secondary | ICD-10-CM

## 2021-04-13 DIAGNOSIS — M1611 Unilateral primary osteoarthritis, right hip: Secondary | ICD-10-CM | POA: Diagnosis not present

## 2021-04-13 DIAGNOSIS — Z9181 History of falling: Secondary | ICD-10-CM | POA: Diagnosis not present

## 2021-04-13 DIAGNOSIS — Z6841 Body Mass Index (BMI) 40.0 and over, adult: Secondary | ICD-10-CM | POA: Diagnosis not present

## 2021-04-13 DIAGNOSIS — I10 Essential (primary) hypertension: Secondary | ICD-10-CM | POA: Diagnosis not present

## 2021-04-14 DIAGNOSIS — M1611 Unilateral primary osteoarthritis, right hip: Secondary | ICD-10-CM | POA: Diagnosis not present

## 2021-04-14 NOTE — Patient Instructions (Signed)
Visit Information: Thank you for taking the time to speak with me today.   PATIENT GOALS:  Goals Addressed             This Visit's Progress    Monitoring and management of High Blood pressure   On track    Timeframe:  Long-Range Goal Priority:  Medium Start Date:  02/11/2021                          Expected End Date:    07/19/2021        Follow up: 05/17/2021          Patient Goals:  - Continue to check or have blood pressure checked 2-3 times per week  - write blood pressure results in a log or diary - Continue to follow low sodium/ DASH diet and eat more fruits, vegetables and lean meats. Cook meats by broiling, baking or roasting. Drink plenty of water, read food labels and use smaller plates for portion control.  -  Continue to take your medications as prescribed.  - Attend doctor visits whether virtually and/ or in person (when transportation needs are arranged).  - Use CPAP as recommended by your doctor.       Pain Management Plan Developed ( osteroarthritis/ chronic pain)   On track    Timeframe:  Long-Range Goal Priority:  High Start Date:      02/11/2021                       Expected End Date: 07/19/2021  Follow up: 05/17/2021  Patient goals      Continue to take your medications as prescribed.  Attend doctors visits as recommended.  (virtually and/ or in person when able) Refill your medications timely Call your provider office for new concerns or questions Continue to turn frequently in bed to prevent skin breakdown. Be aware of signs of skin break down: redness and skin tears.  Report any new symptoms to your primary care provider and/ or wound care physician.   Continue to perform exercises provided to you by home physical therapist. Continue to work with home health physical therapy. Perform home exercises as instructed by your physical / occupational therapist.  Apply hot or cold pack for pain relief to affected area Use assistive devices as recommended by  your provider and physical therapist Incorporate healthy food choices to assist with weight loss. Review education information on healthy diet sent to you in My chart by your RN case manager         Patient verbalizes understanding of instructions provided today and agrees to view in MyChart.   The patient has been provided with contact information for the care management team and has been advised to call with any health related questions or concerns.  The care management team will reach out to the patient again over the next 45 days.   George Ina RN,BSN,CCM RN Case Manager [facility]  [phone]

## 2021-04-14 NOTE — Chronic Care Management (AMB) (Signed)
Chronic Care Management   CCM RN Visit Note  04/14/2021 Name: Michael Payne MRN: [id number] DOB: 1971/08/29  Subjective: Michael Payne is a 50 y.o. year old male who is a primary care patient of Joaquim Nam, MD. The care management team was consulted for assistance with disease management and care coordination needs.    Engaged with patient by telephone for follow up visit in response to provider referral for case management and/or care coordination services.   Consent to Services:  The patient was given information about Chronic Care Management services, agreed to services, and gave verbal consent prior to initiation of services.  Please see initial visit note for detailed documentation.   Patient agreed to services and verbal consent obtained.   Assessment: Review of patient past medical history, allergies, medications, health status, including review of consultants reports, laboratory and other test data, was performed as part of comprehensive evaluation and provision of chronic care management services.   SDOH (Social Determinants of Health) assessments and interventions performed:    CCM Care Plan  Allergies  Allergen Reactions   Aspirin Other (See Comments)    Upset stomach   Lactose Intolerance (Gi)     Outpatient Encounter Medications as of 04/13/2021  Medication Sig   allopurinol (ZYLOPRIM) 300 MG tablet TAKE 1 TABLET TWICE DAILY   amLODipine (NORVASC) 10 MG tablet Take 1 tablet (10 mg total) by mouth daily.   colchicine (COLCRYS) 0.6 MG tablet Take 1 tablet (0.6 mg total) by mouth daily as needed.   diclofenac (VOLTAREN) 75 MG EC tablet TAKE 1 TABLET BY MOUTH 2 TIMES DAILY AS NEEDED. TAKE WITH FOOD.   Dulaglutide (TRULICITY) 0.75 MG/0.5ML SOPN Inject 0.75 mg into the skin once a week.   lidocaine (LIDODERM) 5 % PLACE 1 PATCH ONTO THE SKIN DAILY. REMOVE & DISCARD PATCH WITHIN 12 HOURS   naloxone (NARCAN) nasal spray 4 mg/0.1 mL Spray nostril if needed for opioid  overuse- decreased consciousness, decreased respirations   oxyCODONE-acetaminophen (PERCOCET) 10-325 MG tablet Take 1 tablet by mouth every 6 (six) hours as needed for pain.   tiZANidine (ZANAFLEX) 4 MG tablet Take 1 tablet (4 mg total) by mouth every 8 (eight) hours as needed for muscle spasms.   vitamin C (ASCORBIC ACID) 500 MG tablet Take 500 mg by mouth daily.   No facility-administered encounter medications on file as of 04/13/2021.    Patient Active Problem List   Diagnosis Date Noted   Gout, unspecified 03/10/2021   Edema 03/10/2021   Diabetes mellitus without complication (HCC) 03/10/2021   Muscle spasms of both lower extremities 07/29/2020   Bedbound 05/22/2020   Unilateral osteoarthritis of hip, right 12/27/2019   Severe obstructive sleep apnea 02/22/2018   Hypertriglyceridemia 08/24/2017   Low HDL (under 40) 08/24/2017   Metabolic syndrome 08/24/2017   Morbid obesity (HCC) 10/14/2013   HTN (hypertension) 10/14/2013    Conditions to be addressed/monitored:HTN and Osteoarthritis/ chronic pain  Care Plan : Chronic pain due to osteoarthritis  Updates made by Otho Ket, RN since 04/14/2021 12:00 AM     Problem: Decreased mobility due to osteoarthritis pain and obesity   Priority: High     Long-Range Goal: Patient will perform physical activity within limits of disease   Start Date: 02/11/2021  Expected End Date: 07/19/2021  This Visit's Progress: On track  Recent Progress: On track  Priority: High  Note:   Current Barriers: Patient states he was able to get to his primary care provider  visit on 03/09/2021 by EMS transport. Patient states he continues to have home physical therapy 2 times per week. He states he feels he is doing about the same.  He states he can sit up on the side of the bed with assistance. He states he continues to do his home exercises in between physical therapy visits. Patient reports his right hip pain is up and down, some good days and some not  so good. Patient reports today's pain level is 7-8 due to just having physical therapy.  Patient encouraged to take pain medication prior to physical therapy session. reports having  home physical and occupational therapy services.   He reports ongoing right hip pain and states his pain level today is an 8.   Patient states he has swelling in his right foot.  He reports his doctor is aware. He states he is trying to lay more on his back and elevate his right foot to help with decreasing the swelling. Patient states he is able to sit on the side of the bed with assistance.  Patient states he has sleep apnea.  He states he is not wearing his CPAP because he cannot tolerate the mask. He reports he is claustrophobic. Knowledge Deficits related to self-health management of chronic pain Chronic Disease Management support and education needs related to chronic pain Transportation barriers  Not attend all scheduled provider appointments Unable to perform ADLs independently: Patient continues with PCS services 2 1/2 hours a day, 7 days a week.  Unable to perform IADLs independently.  Clinical Goal(s):  patient will verbalize understanding of plan for pain management. Patient states he takes his pain medication as prescribed. RNCM advised patient to consider taking pain medication prior to physical and/ or occupational therapy visits.  patient will report pain at a level less than 3 to 4 on a 10-10 rating scale., patient will use pharmacological and nonpharmacological pain relief strategies as prescribed. patient will verbalize acceptable level of pain relief and ability to engage in desired activities Patient will work on healthy eating for weight loss  Interventions:  Collaboration with Joaquim Nam, MD regarding development and update of comprehensive plan of care as evidenced by provider attestation and co-signature:  Per chart review of primary care providers' 03/09/2021 note provider will look at  ordering [facility] lift for patient and referring to general surgery and pulmonary.   Inter-disciplinary care team collaboration (see longitudinal plan of care):  Telephone call to [facility] regarding patients CPAP concerns. Spoke with Lawanna Kobus in the CPAP division who states she will have a respiratory therapist return call to discuss.  Pain assessment performed Discussed plans with patient for ongoing care management follow up and provided patient with direct contact information for care management team Reviewed medications with patient and discussed  Reviewed scheduled/upcoming provider appointments Notified social worker patient would like counseling resources for coping with chronic pain. Due to bed bound status, advised patient to turn frequently to prevent skin breakdown.  Reviewed skin breakdown signs/ symptoms.  Provided patient with educational material related to healthy food choices.    Patient Goals/Self Care Activities:  Continue to take your medications as prescribed.  Attend doctors visits as recommended.  (virtually and/ or in person when able) Refill your medications timely Call your provider office for new concerns or questions Continue to turn frequently in bed to prevent skin breakdown. Be aware of signs of skin break down: redness and skin tears.  Report any new symptoms to your primary care  provider and/ or wound care physician.   Continue to perform exercises provided to you by home physical therapist. Continue to work with home health physical therapy. Perform home exercises as instructed by your physical / occupational therapist.  Apply hot or cold pack for pain relief to affected area Use assistive devices as recommended by your provider and physical therapist Incorporate healthy food choices to assist with weight loss. Review education information on healthy diet sent to you in My chart by your RN case manager Follow Up Plan: The patient has been provided with contact  information for the care management team and has been advised to call with any health related questions or concerns.  The care management team will reach out to the patient again over the next 30 days.          Care Plan : Hypertension (Adult)  Updates made by Otho Ket, RN since 04/14/2021 12:00 AM     Problem: Hypertension (Hypertension)   Priority: Medium  Onset Date: 02/11/2021     Long-Range Goal: Hypertension Monitored   Start Date: 02/11/2021  Expected End Date: 07/19/2021  Recent Progress: On track  Priority: Medium  Note:   Objective:  Last practice recorded BP readings:  BP Readings from Last 3 Encounters:  03/09/21 (!) 160/119  11/05/20 127/86  06/27/20 (!) 144/83  Current Barriers: Patient states he had home physical therapy today but forgot to ask the therapist what his blood pressure reading was.  He states his blood pressure is checked 2 times per week by the home health therapist.  He states he does not check his blood pressure.  Patient states he continues to take his medications as prescribed. Patient reports seeing his primary care provider on 03/09/2021.  He reports working with Reece Levy, SW who assisted with stretcher transport arrangements.   Knowledge Deficits related to basic understanding of hypertension pathophysiology and self care management Transportation barriers Not attend all scheduled provider appointments Unable to perform ADLs independently: Patient states he is approved to have  Personal care services 7 days a week (4 hours per day on the weekday and 2 hours a day on the weekend).  Patient states this has been inconsistent due to staffing issues.  RNCM advised patient to contact  the [facility] office to discuss further  Case Manager Clinical Goal(s):  patient will demonstrate improved adherence to prescribed treatment plan for hypertension as evidenced by taking all medications as prescribed, monitoring and recording blood pressure as  directed, adhering to low sodium/DASH diet patient will verbalize basic understanding of hypertension disease process and self health management plan. Interventions:  Collaboration with Joaquim Nam, MD regarding development and update of comprehensive plan of care as evidenced by provider attestation and co-signature:    Patient states he has been in contact with the bariatric clinic and has to complete paperwork to return to them. Per chart review of primary care providers' 03/09/2021 note provider will look at ordering [facility] lift for patient.  Inter-disciplinary care team collaboration (see longitudinal plan of care) Evaluation of current treatment plan related to hypertension self management and patient's adherence to plan as established by provider. Provided education to patient re: stroke prevention, s/s of heart attack and stroke, DASH diet, complications of uncontrolled blood pressure Reviewed medications with patient and discussed importance of compliance Discussed plans with patient for ongoing care management follow up and provided patient with direct contact information for care management team Advised patient, providing education and rationale, to monitor  blood pressure daily and record, calling PCP for findings outside established parameters.  Reviewed scheduled/upcoming provider appointments Discussed importance of using CPAP machine for sleep apnea Self-Care Activities: - Self administers medications as prescribed Calls provider office for new concerns, questions, or BP outside discussed parameters Checks BP and records as discussed Follows a low sodium diet/DASH diet Patient Goals: - Continue to check or have blood pressure checked 2-3 times per week  - write blood pressure results in a log or diary - Continue to follow low sodium/ DASH diet and eat more fruits, vegetables and lean meats. Cook meats by broiling, baking or roasting. Drink plenty of water, read food labels and  use smaller plates for portion control.  -  Continue to take your medications as prescribed.  - Attend doctor visits whether virtually and/ or in person (when transportation needs are arranged).  - Use CPAP as recommended by your doctor.  Follow Up Plan: The patient has been provided with contact information for the care management team and has been advised to call with any health related questions or concerns.  The care management team will reach out to the patient again over the next 30 days.           Plan:The patient has been provided with contact information for the care management team and has been advised to call with any health related questions or concerns.  and The care management team will reach out to the patient again over the next 45 days. George Ina RN,BSN,CCM RN Case Manager [facility]  [phone]

## 2021-04-15 DIAGNOSIS — M150 Primary generalized (osteo)arthritis: Secondary | ICD-10-CM | POA: Diagnosis not present

## 2021-04-15 DIAGNOSIS — D649 Anemia, unspecified: Secondary | ICD-10-CM | POA: Diagnosis not present

## 2021-04-15 DIAGNOSIS — Z6841 Body Mass Index (BMI) 40.0 and over, adult: Secondary | ICD-10-CM | POA: Diagnosis not present

## 2021-04-15 DIAGNOSIS — Z87891 Personal history of nicotine dependence: Secondary | ICD-10-CM | POA: Diagnosis not present

## 2021-04-15 DIAGNOSIS — K219 Gastro-esophageal reflux disease without esophagitis: Secondary | ICD-10-CM | POA: Diagnosis not present

## 2021-04-15 DIAGNOSIS — Z9181 History of falling: Secondary | ICD-10-CM | POA: Diagnosis not present

## 2021-04-15 DIAGNOSIS — G4733 Obstructive sleep apnea (adult) (pediatric): Secondary | ICD-10-CM | POA: Diagnosis not present

## 2021-04-15 DIAGNOSIS — M1611 Unilateral primary osteoarthritis, right hip: Secondary | ICD-10-CM | POA: Diagnosis not present

## 2021-04-15 DIAGNOSIS — I10 Essential (primary) hypertension: Secondary | ICD-10-CM | POA: Diagnosis not present

## 2021-04-16 DIAGNOSIS — M1611 Unilateral primary osteoarthritis, right hip: Secondary | ICD-10-CM | POA: Diagnosis not present

## 2021-04-17 DIAGNOSIS — M1611 Unilateral primary osteoarthritis, right hip: Secondary | ICD-10-CM | POA: Diagnosis not present

## 2021-04-17 DIAGNOSIS — R269 Unspecified abnormalities of gait and mobility: Secondary | ICD-10-CM | POA: Diagnosis not present

## 2021-04-17 DIAGNOSIS — M1631 Unilateral osteoarthritis resulting from hip dysplasia, right hip: Secondary | ICD-10-CM | POA: Diagnosis not present

## 2021-04-18 DIAGNOSIS — M1611 Unilateral primary osteoarthritis, right hip: Secondary | ICD-10-CM | POA: Diagnosis not present

## 2021-04-19 DIAGNOSIS — F330 Major depressive disorder, recurrent, mild: Secondary | ICD-10-CM | POA: Diagnosis not present

## 2021-04-19 DIAGNOSIS — Z7401 Bed confinement status: Secondary | ICD-10-CM | POA: Diagnosis not present

## 2021-04-19 DIAGNOSIS — M1611 Unilateral primary osteoarthritis, right hip: Secondary | ICD-10-CM | POA: Diagnosis not present

## 2021-04-19 DIAGNOSIS — G4733 Obstructive sleep apnea (adult) (pediatric): Secondary | ICD-10-CM | POA: Diagnosis not present

## 2021-04-19 DIAGNOSIS — R32 Unspecified urinary incontinence: Secondary | ICD-10-CM | POA: Diagnosis not present

## 2021-04-19 DIAGNOSIS — F419 Anxiety disorder, unspecified: Secondary | ICD-10-CM | POA: Diagnosis not present

## 2021-04-20 DIAGNOSIS — I10 Essential (primary) hypertension: Secondary | ICD-10-CM | POA: Diagnosis not present

## 2021-04-20 DIAGNOSIS — K219 Gastro-esophageal reflux disease without esophagitis: Secondary | ICD-10-CM | POA: Diagnosis not present

## 2021-04-20 DIAGNOSIS — M1611 Unilateral primary osteoarthritis, right hip: Secondary | ICD-10-CM | POA: Diagnosis not present

## 2021-04-20 DIAGNOSIS — G4733 Obstructive sleep apnea (adult) (pediatric): Secondary | ICD-10-CM | POA: Diagnosis not present

## 2021-04-20 DIAGNOSIS — Z87891 Personal history of nicotine dependence: Secondary | ICD-10-CM | POA: Diagnosis not present

## 2021-04-20 DIAGNOSIS — D649 Anemia, unspecified: Secondary | ICD-10-CM | POA: Diagnosis not present

## 2021-04-20 DIAGNOSIS — M150 Primary generalized (osteo)arthritis: Secondary | ICD-10-CM | POA: Diagnosis not present

## 2021-04-20 DIAGNOSIS — Z6841 Body Mass Index (BMI) 40.0 and over, adult: Secondary | ICD-10-CM | POA: Diagnosis not present

## 2021-04-20 DIAGNOSIS — Z9181 History of falling: Secondary | ICD-10-CM | POA: Diagnosis not present

## 2021-04-21 ENCOUNTER — Ambulatory Visit (INDEPENDENT_AMBULATORY_CARE_PROVIDER_SITE_OTHER): Payer: Medicare HMO | Admitting: *Deleted

## 2021-04-21 DIAGNOSIS — I10 Essential (primary) hypertension: Secondary | ICD-10-CM | POA: Diagnosis not present

## 2021-04-21 DIAGNOSIS — G4733 Obstructive sleep apnea (adult) (pediatric): Secondary | ICD-10-CM

## 2021-04-21 DIAGNOSIS — F339 Major depressive disorder, recurrent, unspecified: Secondary | ICD-10-CM

## 2021-04-21 DIAGNOSIS — M1611 Unilateral primary osteoarthritis, right hip: Secondary | ICD-10-CM

## 2021-04-21 DIAGNOSIS — E119 Type 2 diabetes mellitus without complications: Secondary | ICD-10-CM

## 2021-04-21 DIAGNOSIS — Z7401 Bed confinement status: Secondary | ICD-10-CM

## 2021-04-21 NOTE — Chronic Care Management (AMB) (Signed)
Chronic Care Management    Clinical Social Work Note  04/21/2021 Name: Michael Payne MRN: [id number] DOB: 01/19/1971  Michael Payne is a 50 y.o. year old male who is a primary care patient of [name], MD. The CCM team was consulted to assist the patient with chronic disease management and/or care coordination needs related to: Transportation Needs , [employer] , Mental Health Counseling and Resources, and Caregiver Stress.   Engaged with patient by telephone for follow up visit in response to provider referral for social work chronic care management and care coordination services.   Consent to Services:  The patient was given information about Chronic Care Management services, agreed to services, and gave verbal consent prior to initiation of services.  Please see initial visit note for detailed documentation.   Patient agreed to services and consent obtained.   Assessment: Review of patient past medical history, allergies, medications, and health status, including review of relevant consultants reports was performed today as part of a comprehensive evaluation and provision of chronic care management and care coordination services.     SDOH (Social Determinants of Health) assessments and interventions performed:    Advanced Directives Status: Not addressed in this encounter.  CCM Care Plan  Allergies  Allergen Reactions   Aspirin Other (See Comments)    Upset stomach   Lactose Intolerance (Gi)     Outpatient Encounter Medications as of 04/21/2021  Medication Sig   allopurinol (ZYLOPRIM) 300 MG tablet TAKE 1 TABLET TWICE DAILY   amLODipine (NORVASC) 10 MG tablet Take 1 tablet (10 mg total) by mouth daily.   colchicine (COLCRYS) 0.6 MG tablet Take 1 tablet (0.6 mg total) by mouth daily as needed.   diclofenac (VOLTAREN) 75 MG EC tablet TAKE 1 TABLET BY MOUTH 2 TIMES DAILY AS NEEDED. TAKE WITH FOOD.   Dulaglutide (TRULICITY) 0.75 MG/0.5ML SOPN Inject 0.75 mg into the  skin once a week.   lidocaine (LIDODERM) 5 % PLACE 1 PATCH ONTO THE SKIN DAILY. REMOVE & DISCARD PATCH WITHIN 12 HOURS   naloxone (NARCAN) nasal spray 4 mg/0.1 mL Spray nostril if needed for opioid overuse- decreased consciousness, decreased respirations   oxyCODONE-acetaminophen (PERCOCET) 10-325 MG tablet Take 1 tablet by mouth every 6 (six) hours as needed for pain.   tiZANidine (ZANAFLEX) 4 MG tablet Take 1 tablet (4 mg total) by mouth every 8 (eight) hours as needed for muscle spasms.   vitamin C (ASCORBIC ACID) 500 MG tablet Take 500 mg by mouth daily.   No facility-administered encounter medications on file as of 04/21/2021.    Patient Active Problem List   Diagnosis Date Noted   Gout, unspecified 03/10/2021   Edema 03/10/2021   Diabetes mellitus without complication (HCC) 03/10/2021   Muscle spasms of both lower extremities 07/29/2020   Bedbound 05/22/2020   Unilateral osteoarthritis of hip, right 12/27/2019   Severe obstructive sleep apnea 02/22/2018   Hypertriglyceridemia 08/24/2017   Low HDL (under 40) 08/24/2017   Metabolic syndrome 08/24/2017   Morbid obesity (HCC) 10/14/2013   HTN (hypertension) 10/14/2013    Conditions to be addressed/monitored: Depression and bariatric/bedbound care needs ; Mental Health Concerns , Inability to perform ADL's independently, and Lacks knowledge of community resource: s  Care Plan : Counsellor of Plan for Management of Functional Limitations and Needs  Updates made by Buck Mam, LCSW since 04/21/2021 12:00 AM     Problem: Social and Functional Symptoms      Long-Range Goal: Optimize pt's quality  of life and opportunities   Start Date: 01/25/2021  Expected End Date: 06/18/2021  This Visit's Progress: On track  Recent Progress: On track  Priority: High  Note:   Current barriers:   Transportation, Housing barriers, Level of care concerns, ADL IADL limitations, Social Isolation, Limited access to caregiver, Inability to perform  ADL's independently, Inability to perform IADL's independently, and Lacks knowledge of community resource Clinical Goals: Patient will work with CSW  to address needs related to transportation, PCS/CAPS and other needs as assessed and identified Clinical Interventions:  CSW spoke with pt who reports he is still receiving HHPT- working  on [employer] and continued strengthening and gaining independence. He has been approved for 80 hours monthly for personal care and this is now in place and helping a lot.  Pt also awaiting word about [facility]- stating he has completed the paperwork.  Pt has also had his first counseling appointment and is planning to have on-going sessions with them (virtually).    Pt was previously provided with # to call if transportation is needed again by EMS.  PCP office to initiate a Tree [employer]" through his insurance company by calling [phone] and completing necessary steps for EMS authorized two-way nonemergent EMS rides.  Collaboration with Joaquim Nam, MD regarding development and update of comprehensive plan of care as evidenced by provider attestation and co-signature Inter-disciplinary care team collaboration (see longitudinal plan of care) Assessment of needs, barriers , agencies contacted, as well as how impacting  Review various resources, discussed options and provided patient information about Department of Social Services (food stamps, Medicaid, and utilities assistance), Transportation provided by insurance provider, Enhanced Benefits connected with insurance provider, Referral to care guide for community support and other options, SCAT transportation, and [employer] Alternative Program CAP Patient interviewed and appropriate assessments performed Referred patient to community resources care guide team for assistance with ramps (building/ex[expense,etc)  Provided mental health counseling with regard to pt's current isolation and  being homebound/bedbound Provided patient with information about SCAT, CAPS, transportation resources Discussed plans with patient for ongoing care management follow up and provided patient with direct contact information for care management team Advised patient to call DSS worker to request CAPS services Advised patient to complete PCS application/request for increase in hours provided Collaborated with primary care provider re: insurance prior auth process for stretcher transportation Assisted patient/caregiver with obtaining information about health plan benefits Other interventions provided:Solution-Focused Strategies, Active listening / Reflection utilized , Emotional Supportive Provided, Problem Solving /Task Center , Psychoeducation /Health Education, Motivational Interviewing, Brief CBT , Increase in activities / exercise encouraged (with HHPT), and Provided basic mental health support, education   Patient Goals/Self-Care Activities: Over the next 30 days  Follow Up Date 05/18/21  -continue with  virtual  counseling - keep 90 percent of counseling appointments - schedule counseling appointment  -follow up with PCS office re: staffing of hours, etc.  -follow up with Bariatric Coordinator for next steps  - begin a notebook of services in my neighborhood or community - call 211 when I need some help - follow-up on any referrals for help I am given - think ahead to make sure my need does not become an emergency - make a note about what I need to have by the phone or take with me, like an identification card or social security number have a back-up plan - have a back-up plan - make a list of family or friends that I can call -continue  working with Home Health PT      Follow Up Plan: Appointment scheduled for SW follow up with client by phone on: 05/18/21      Reece Levy MSW, LCSW Licensed Clinical Social Worker [facility] [phone]

## 2021-04-21 NOTE — Patient Instructions (Signed)
Visit Information  PATIENT GOALS:  Goals Addressed               This Visit's Progress     Need help to get out of house and to Dr Lianne Bushy (pt-stated)        Timeframe:  Long-Range Goal Priority:  High Start Date:       01/25/2021                      Expected End Date:  06/18/2021                     Follow Up Date 05/18/2021    -continue with  virtual  counseling - keep 90 percent of counseling appointments - schedule counseling appointment  -follow up with PCS office re: staffing of hours, etc.  -follow up with Bariatric Coordinator for next steps  - begin a notebook of services in my neighborhood or community - call 211 when I need some help - follow-up on any referrals for help I am given - think ahead to make sure my need does not become an emergency - make a note about what I need to have by the phone or take with me, like an identification card or social security number have a back-up plan - have a back-up plan - make a list of family or friends that I can call -continue working with Home Health PT  Why is this important?   Knowing how and where to find help for yourself or family in your neighborhood and community is an important skill.  You will want to take some steps to learn how.    Notes:         The patient verbalized understanding of instructions, educational materials, and care plan provided today and declined offer to receive copy of patient instructions, educational materials, and care plan.   Telephone follow up appointment with care management team member scheduled for:05/18/21  Reece Levy MSW, LCSW Licensed Clinical Social Worker [facility] [phone]

## 2021-04-22 ENCOUNTER — Telehealth: Payer: Self-pay | Admitting: Family Medicine

## 2021-04-22 DIAGNOSIS — M1611 Unilateral primary osteoarthritis, right hip: Secondary | ICD-10-CM | POA: Diagnosis not present

## 2021-04-22 NOTE — Telephone Encounter (Signed)
Rick from [facility] called to make Dr. Para March aware that patient missed his appt for OT today due to not feeling well. FYI.

## 2021-04-23 NOTE — Telephone Encounter (Addendum)
Noted. Thanks.  Please check with OT-if he has significant new symptoms (fever, etc.) or if he is feeling worse then let me know.  Otherwise I will defer.

## 2021-04-26 DIAGNOSIS — Z9181 History of falling: Secondary | ICD-10-CM | POA: Diagnosis not present

## 2021-04-26 DIAGNOSIS — I10 Essential (primary) hypertension: Secondary | ICD-10-CM | POA: Diagnosis not present

## 2021-04-26 DIAGNOSIS — G4733 Obstructive sleep apnea (adult) (pediatric): Secondary | ICD-10-CM | POA: Diagnosis not present

## 2021-04-26 DIAGNOSIS — Z87891 Personal history of nicotine dependence: Secondary | ICD-10-CM | POA: Diagnosis not present

## 2021-04-26 DIAGNOSIS — K219 Gastro-esophageal reflux disease without esophagitis: Secondary | ICD-10-CM | POA: Diagnosis not present

## 2021-04-26 DIAGNOSIS — M1611 Unilateral primary osteoarthritis, right hip: Secondary | ICD-10-CM | POA: Diagnosis not present

## 2021-04-26 DIAGNOSIS — M150 Primary generalized (osteo)arthritis: Secondary | ICD-10-CM | POA: Diagnosis not present

## 2021-04-26 DIAGNOSIS — Z6841 Body Mass Index (BMI) 40.0 and over, adult: Secondary | ICD-10-CM | POA: Diagnosis not present

## 2021-04-26 DIAGNOSIS — D649 Anemia, unspecified: Secondary | ICD-10-CM | POA: Diagnosis not present

## 2021-04-26 NOTE — Telephone Encounter (Signed)
Spoke with patient and he is doing much better. Just has a cough left. He did take a covid test and that was negative. Nothing further needed at this time.

## 2021-04-27 DIAGNOSIS — M1611 Unilateral primary osteoarthritis, right hip: Secondary | ICD-10-CM | POA: Diagnosis not present

## 2021-04-28 DIAGNOSIS — M1611 Unilateral primary osteoarthritis, right hip: Secondary | ICD-10-CM | POA: Diagnosis not present

## 2021-04-29 DIAGNOSIS — M1611 Unilateral primary osteoarthritis, right hip: Secondary | ICD-10-CM | POA: Diagnosis not present

## 2021-04-30 ENCOUNTER — Telehealth: Payer: Self-pay | Admitting: Family Medicine

## 2021-04-30 DIAGNOSIS — Z6841 Body Mass Index (BMI) 40.0 and over, adult: Secondary | ICD-10-CM | POA: Diagnosis not present

## 2021-04-30 DIAGNOSIS — Z87891 Personal history of nicotine dependence: Secondary | ICD-10-CM | POA: Diagnosis not present

## 2021-04-30 DIAGNOSIS — G4733 Obstructive sleep apnea (adult) (pediatric): Secondary | ICD-10-CM | POA: Diagnosis not present

## 2021-04-30 DIAGNOSIS — M150 Primary generalized (osteo)arthritis: Secondary | ICD-10-CM | POA: Diagnosis not present

## 2021-04-30 DIAGNOSIS — I10 Essential (primary) hypertension: Secondary | ICD-10-CM | POA: Diagnosis not present

## 2021-04-30 DIAGNOSIS — K219 Gastro-esophageal reflux disease without esophagitis: Secondary | ICD-10-CM | POA: Diagnosis not present

## 2021-04-30 DIAGNOSIS — Z9181 History of falling: Secondary | ICD-10-CM | POA: Diagnosis not present

## 2021-04-30 DIAGNOSIS — D649 Anemia, unspecified: Secondary | ICD-10-CM | POA: Diagnosis not present

## 2021-04-30 NOTE — Telephone Encounter (Signed)
Type of forms received: Medical paperwork Routed to: Wendie Simmer  Paperwork received by :  Merrie Roof   Individual made aware of 3-5 business day turn around (Y/N): y Form completed and patient made aware of charges(Y/N): y  Faxed to :   Form location:  in folder

## 2021-04-30 NOTE — Telephone Encounter (Signed)
Please pull the hard copy and I'll work on it.  Thanks.

## 2021-04-30 NOTE — Telephone Encounter (Signed)
This form is on Jessica's desk. The patient was last seen in June.  It is a weight loss form.  Una Yeomans,cma

## 2021-05-01 DIAGNOSIS — M1611 Unilateral primary osteoarthritis, right hip: Secondary | ICD-10-CM | POA: Diagnosis not present

## 2021-05-02 DIAGNOSIS — M1611 Unilateral primary osteoarthritis, right hip: Secondary | ICD-10-CM | POA: Diagnosis not present

## 2021-05-02 NOTE — Telephone Encounter (Signed)
I am working on the hardcopy forms.  Please scan and send when completed.  Please attach med and allergy list.  I need specific information from the patient about previous weight loss attempts/diet modifications/results/etc.  There are some specific forms about this and I left him click to the front of the folder.  Please check with patient on this.  I cannot complete his letter until I have that information.  Thanks.

## 2021-05-03 NOTE — Telephone Encounter (Signed)
Called patient and went over previous weight loss progarms/diets. Patient states he has done a weight loss program at [facility], tried the [employer] for about 6 months many years ago, tried to be a vegetarian, low calorie diet, and had been on trulicity in the past. Patient is also currently doing the keto diet. Patient did not know time spans on diets.

## 2021-05-04 ENCOUNTER — Other Ambulatory Visit: Payer: Self-pay | Admitting: Family Medicine

## 2021-05-04 DIAGNOSIS — F330 Major depressive disorder, recurrent, mild: Secondary | ICD-10-CM | POA: Diagnosis not present

## 2021-05-04 DIAGNOSIS — F419 Anxiety disorder, unspecified: Secondary | ICD-10-CM | POA: Diagnosis not present

## 2021-05-04 DIAGNOSIS — M1611 Unilateral primary osteoarthritis, right hip: Secondary | ICD-10-CM

## 2021-05-04 NOTE — Telephone Encounter (Signed)
Pt. Dropped off some additional paerwork A physicians Cert. ([place])

## 2021-05-04 NOTE — Telephone Encounter (Signed)
Letter done for patient.  I filled out as much as I could on his paperwork.  Thanks.

## 2021-05-04 NOTE — Telephone Encounter (Signed)
Last refilled on 02/12/21 #60 with 1 refill LOV 03/09/21 Follow up in person. No future office visits scheduled with PCP

## 2021-05-04 NOTE — Telephone Encounter (Signed)
Patient notified that forms are ready for pickup. Forms have been copied and sent to scan and patients forms placed up front for pick up.

## 2021-05-04 NOTE — Telephone Encounter (Signed)
Open in error

## 2021-05-04 NOTE — Telephone Encounter (Signed)
Done in error.

## 2021-05-05 DIAGNOSIS — M150 Primary generalized (osteo)arthritis: Secondary | ICD-10-CM | POA: Diagnosis not present

## 2021-05-05 DIAGNOSIS — G4733 Obstructive sleep apnea (adult) (pediatric): Secondary | ICD-10-CM | POA: Diagnosis not present

## 2021-05-05 DIAGNOSIS — Z9181 History of falling: Secondary | ICD-10-CM | POA: Diagnosis not present

## 2021-05-05 DIAGNOSIS — Z87891 Personal history of nicotine dependence: Secondary | ICD-10-CM | POA: Diagnosis not present

## 2021-05-05 DIAGNOSIS — D649 Anemia, unspecified: Secondary | ICD-10-CM | POA: Diagnosis not present

## 2021-05-05 DIAGNOSIS — Z6841 Body Mass Index (BMI) 40.0 and over, adult: Secondary | ICD-10-CM | POA: Diagnosis not present

## 2021-05-05 DIAGNOSIS — I10 Essential (primary) hypertension: Secondary | ICD-10-CM | POA: Diagnosis not present

## 2021-05-05 DIAGNOSIS — K219 Gastro-esophageal reflux disease without esophagitis: Secondary | ICD-10-CM | POA: Diagnosis not present

## 2021-05-05 DIAGNOSIS — M1611 Unilateral primary osteoarthritis, right hip: Secondary | ICD-10-CM | POA: Diagnosis not present

## 2021-05-05 NOTE — Telephone Encounter (Signed)
Sent. Thanks.   

## 2021-05-06 ENCOUNTER — Telehealth: Payer: Self-pay | Admitting: Family Medicine

## 2021-05-06 DIAGNOSIS — G4733 Obstructive sleep apnea (adult) (pediatric): Secondary | ICD-10-CM | POA: Diagnosis not present

## 2021-05-06 DIAGNOSIS — Z87891 Personal history of nicotine dependence: Secondary | ICD-10-CM | POA: Diagnosis not present

## 2021-05-06 DIAGNOSIS — M150 Primary generalized (osteo)arthritis: Secondary | ICD-10-CM | POA: Diagnosis not present

## 2021-05-06 DIAGNOSIS — M1611 Unilateral primary osteoarthritis, right hip: Secondary | ICD-10-CM | POA: Diagnosis not present

## 2021-05-06 DIAGNOSIS — K219 Gastro-esophageal reflux disease without esophagitis: Secondary | ICD-10-CM | POA: Diagnosis not present

## 2021-05-06 DIAGNOSIS — D649 Anemia, unspecified: Secondary | ICD-10-CM | POA: Diagnosis not present

## 2021-05-06 DIAGNOSIS — Z6841 Body Mass Index (BMI) 40.0 and over, adult: Secondary | ICD-10-CM | POA: Diagnosis not present

## 2021-05-06 DIAGNOSIS — Z9181 History of falling: Secondary | ICD-10-CM | POA: Diagnosis not present

## 2021-05-06 DIAGNOSIS — I10 Essential (primary) hypertension: Secondary | ICD-10-CM | POA: Diagnosis not present

## 2021-05-06 NOTE — Telephone Encounter (Signed)
LOV - 03/09/21 NOV - not scheduled Last refilled - 04/07/21 #120/0

## 2021-05-06 NOTE — Telephone Encounter (Signed)
  Encourage patient to contact the pharmacy for refills or they can request refills through [facility]  LAST APPOINTMENT DATE:  Please schedule appointment if longer than 1 year  NEXT APPOINTMENT DATE:  MEDICATION: oxyCODONE-acetaminophen (PERCOCET) 10-325 MG tablet  Is the patient out of medication? yes  PHARMACY:[facility] [address]  Let patient know to contact pharmacy at the end of the day to make sure medication is ready.  Please notify patient to allow 48-72 hours to process  CLINICAL FILLS OUT ALL BELOW:   LAST REFILL:  QTY:  REFILL DATE:    OTHER COMMENTS:    Okay for refill?  Please advise

## 2021-05-07 DIAGNOSIS — M1611 Unilateral primary osteoarthritis, right hip: Secondary | ICD-10-CM | POA: Diagnosis not present

## 2021-05-07 MED ORDER — OXYCODONE-ACETAMINOPHEN 10-325 MG PO TABS: 1.0000 | ORAL_TABLET | Freq: Four times a day (QID) | ORAL | 0 refills | Status: DC | PRN

## 2021-05-07 NOTE — Addendum Note (Signed)
Addended by: Joaquim Nam on: 05/07/2021 07:59 AM   Modules accepted: Orders

## 2021-05-07 NOTE — Telephone Encounter (Signed)
Sent. Thanks.   

## 2021-05-08 DIAGNOSIS — M1611 Unilateral primary osteoarthritis, right hip: Secondary | ICD-10-CM | POA: Diagnosis not present

## 2021-05-09 DIAGNOSIS — M1611 Unilateral primary osteoarthritis, right hip: Secondary | ICD-10-CM | POA: Diagnosis not present

## 2021-05-10 DIAGNOSIS — M1611 Unilateral primary osteoarthritis, right hip: Secondary | ICD-10-CM | POA: Diagnosis not present

## 2021-05-10 DIAGNOSIS — F419 Anxiety disorder, unspecified: Secondary | ICD-10-CM | POA: Diagnosis not present

## 2021-05-10 DIAGNOSIS — F330 Major depressive disorder, recurrent, mild: Secondary | ICD-10-CM | POA: Diagnosis not present

## 2021-05-11 DIAGNOSIS — M6281 Muscle weakness (generalized): Secondary | ICD-10-CM | POA: Diagnosis not present

## 2021-05-11 DIAGNOSIS — M1611 Unilateral primary osteoarthritis, right hip: Secondary | ICD-10-CM | POA: Diagnosis not present

## 2021-05-12 DIAGNOSIS — M1611 Unilateral primary osteoarthritis, right hip: Secondary | ICD-10-CM | POA: Diagnosis not present

## 2021-05-13 ENCOUNTER — Telehealth: Payer: Self-pay

## 2021-05-13 DIAGNOSIS — Z87891 Personal history of nicotine dependence: Secondary | ICD-10-CM | POA: Diagnosis not present

## 2021-05-13 DIAGNOSIS — D649 Anemia, unspecified: Secondary | ICD-10-CM | POA: Diagnosis not present

## 2021-05-13 DIAGNOSIS — Z6841 Body Mass Index (BMI) 40.0 and over, adult: Secondary | ICD-10-CM | POA: Diagnosis not present

## 2021-05-13 DIAGNOSIS — G4733 Obstructive sleep apnea (adult) (pediatric): Secondary | ICD-10-CM | POA: Diagnosis not present

## 2021-05-13 DIAGNOSIS — M1611 Unilateral primary osteoarthritis, right hip: Secondary | ICD-10-CM | POA: Diagnosis not present

## 2021-05-13 DIAGNOSIS — Z9181 History of falling: Secondary | ICD-10-CM | POA: Diagnosis not present

## 2021-05-13 DIAGNOSIS — K219 Gastro-esophageal reflux disease without esophagitis: Secondary | ICD-10-CM | POA: Diagnosis not present

## 2021-05-13 DIAGNOSIS — M150 Primary generalized (osteo)arthritis: Secondary | ICD-10-CM | POA: Diagnosis not present

## 2021-05-13 DIAGNOSIS — I10 Essential (primary) hypertension: Secondary | ICD-10-CM | POA: Diagnosis not present

## 2021-05-13 NOTE — Telephone Encounter (Signed)
Patient called in letting Dr.Duncan know that he got a surgery date for bariatric, it is scheduled for 05/28/21 at 3:30 pm with Dr.Carl [name].

## 2021-05-14 DIAGNOSIS — M1611 Unilateral primary osteoarthritis, right hip: Secondary | ICD-10-CM | POA: Diagnosis not present

## 2021-05-14 NOTE — Telephone Encounter (Signed)
Noted.  Thanks.  I will await his consult note.

## 2021-05-15 ENCOUNTER — Ambulatory Visit: Payer: Medicare HMO

## 2021-05-15 DIAGNOSIS — M1611 Unilateral primary osteoarthritis, right hip: Secondary | ICD-10-CM | POA: Diagnosis not present

## 2021-05-15 DIAGNOSIS — Z748 Other problems related to care provider dependency: Secondary | ICD-10-CM

## 2021-05-15 DIAGNOSIS — Z122 Encounter for screening for malignant neoplasm of respiratory organs: Secondary | ICD-10-CM

## 2021-05-15 DIAGNOSIS — Z0000 Encounter for general adult medical examination without abnormal findings: Secondary | ICD-10-CM

## 2021-05-15 NOTE — Progress Notes (Signed)
Subjective:   Michael Payne is a 50 y.o. male who presents for Medicare Annual/Subsequent preventive examination.  I connected with  Michael Payne on 05/15/21 by a audio enabled telemedicine application and verified that I am speaking with the correct person using two identifiers.  I discussed the limitations of evaluation and management by telemedicine. The patient expressed understanding and agreed to proceed.   Patient location: HOME Provider location: OFFICE Persons participating in virtual visit: Cheng Dec  Review of Systems    DEFER TO PCP     Objective:    There were no vitals filed for this visit. There is no height or weight on file to calculate BMI.  Advanced Directives 04/06/2021 02/11/2021 03/24/2020 12/27/2019 07/24/2015 12/08/2014 09/30/2014  Does Patient Have a Medical Advance Directive? No No No No No No No  Does patient want to make changes to medical advance directive? No - Patient declined - - - - - -  Would patient like information on creating a medical advance directive? - Yes (MAU/Ambulatory/Procedural Areas - Information given) No - Patient declined No - Patient declined No - patient declined information Yes - Educational materials given -    Current Medications (verified) Outpatient Encounter Medications as of 05/15/2021  Medication Sig   allopurinol (ZYLOPRIM) 300 MG tablet TAKE 1 TABLET TWICE DAILY   amLODipine (NORVASC) 10 MG tablet Take 1 tablet (10 mg total) by mouth daily.   colchicine (COLCRYS) 0.6 MG tablet Take 1 tablet (0.6 mg total) by mouth daily as needed.   diclofenac (VOLTAREN) 75 MG EC tablet TAKE 1 TABLET BY MOUTH 2 TIMES DAILY AS NEEDED. TAKE WITH FOOD.   Dulaglutide (TRULICITY) 0.75 MG/0.5ML SOPN Inject 0.75 mg into the skin once a week.   lidocaine (LIDODERM) 5 % PLACE 1 PATCH ONTO THE SKIN DAILY. REMOVE & DISCARD PATCH WITHIN 12 HOURS   naloxone (NARCAN) nasal spray 4 mg/0.1 mL Spray nostril if needed for opioid overuse- decreased  consciousness, decreased respirations   oxyCODONE-acetaminophen (PERCOCET) 10-325 MG tablet Take 1 tablet by mouth every 6 (six) hours as needed for pain.   tiZANidine (ZANAFLEX) 4 MG tablet Take 1 tablet (4 mg total) by mouth every 8 (eight) hours as needed for muscle spasms.   vitamin C (ASCORBIC ACID) 500 MG tablet Take 500 mg by mouth daily.   No facility-administered encounter medications on file as of 05/15/2021.    Allergies (verified) Aspirin and Lactose intolerance (gi)   History: Past Medical History:  Diagnosis Date   Anemia    borderline anemia, was instructed to eat more iron filled foods   GERD (gastroesophageal reflux disease)    lactose intolerance   Hip pain    HTN (hypertension)    Obesity    Sleep apnea    Past Surgical History:  Procedure Laterality Date   FOOT SURGERY     KNEE ARTHROSCOPY WITH MEDIAL MENISECTOMY Left 12/16/2014   Procedure: KNEE ARTHROSCOPY WITH MEDIAL MENISECTOMY;  Surgeon: Sheral Apley, MD;  Location: [facility] OR;  Service: Orthopedics;  Laterality: Left;   Family History  Problem Relation Age of Onset   Heart attack Mother 9   Lung cancer Father    Cancer Father    CAD Maternal Grandfather    Social History   Socioeconomic History   Marital status: Married    Spouse name: Not on file   Number of children: 2   Years of education: Not on file   Highest education level: Not on file  Occupational History   Occupation: disabled  Tobacco Use   Smoking status: Former    Packs/day: 2.00    Years: 22.00    Pack years: 44.00    Types: Cigarettes    Quit date: 02/24/2011    Years since quitting: 10.2   Smokeless tobacco: Never  Vaping Use   Vaping Use: Never used  Substance and Sexual Activity   Alcohol use: Not Currently    Comment: Weekends/beer-- has slowed down  alot   Drug use: Not Currently    Types: Marijuana    Comment: former -  quit in 2012   Sexual activity: Yes    Partners: Female  Other Topics Concern   Not on  file  Social History Narrative   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   Former Hydrologist.   Married.   Social Determinants of Health   Financial Resource Strain: Low Risk    Difficulty of Paying Living Expenses: Not hard at all  Food Insecurity: No Food Insecurity   Worried About Programme researcher, broadcasting/film/video in the Last Year: Never true   Barista in the Last Year: Never true  Transportation Needs: Unmet Transportation Needs   Lack of Transportation (Medical): Yes   Lack of Transportation (Non-Medical): Yes  Physical Activity: Inactive   Days of Exercise per Week: 0 days   Minutes of Exercise per Session: 0 min  Stress: Stress Concern Present   Feeling of Stress : To some extent  Social Connections: Unknown   Frequency of Communication with Friends and Family: Twice a week   Frequency of Social Gatherings with Friends and Family: Once a week   Attends Religious Services: Never   Database administrator or Organizations: No   Attends Banker Meetings: Never   Marital Status: Not on file    Tobacco Counseling Not needed. Patient quit in 2012 - 44 years of smoking in past.   Clinical Intake:  Pre-visit preparation completed: Yes  Nutritional Status: BMI > 30  Obese Nutritional Risks: Unintentional weight gain Diabetes: Yes CBG done?: No Did pt. bring in CBG monitor from home?: No   Diabetic? YES  Interpreter Needed?: No  Information entered by :: Margaretha Sheffield, CMA   Activities of Daily Living In your present state of health, do you have any difficulty performing the following activities: 05/15/2021 04/06/2021  Hearing? N N  Vision? N N  Difficulty concentrating or making decisions? N N  Walking or climbing stairs? Y Y  Dressing or bathing? Y Y  Doing errands, shopping? Malvin Johns  Preparing Food and eating ? - Y  Using the Toilet? N Y  In the past six months, have you accidently leaked urine? N N  Do you have problems with loss of  bowel control? N N  Managing your Medications? N Y  Managing your Finances? N Y  Housekeeping or managing your Housekeeping? Malvin Johns  Some recent data might be hidden    Patient Care Team: Joaquim Nam, MD as PCP - General (Family Medicine) Buck Mam, LCSW as Social Worker (Licensed Clinical Social Worker) Chilton Si, Loney Loh, RN as Case Manager Coralyn Helling, MD as Consulting Physician (Pulmonary Disease) [name], Hyman Hopes, MD as Consulting Physician (Surgery)  Indicate any recent Medical Services you may have received from other than [facility] providers in the past year (date may be approximate).     Assessment:   This is a routine wellness examination for  Harm.  Hearing/Vision screen Patient has no concerns at this time.  Dietary issues and exercise activities discussed: Current Exercise Habits: The patient does not participate in regular exercise at present   Depression Screen PHQ 2/9 Scores 05/15/2021 04/06/2021 02/11/2021 09/02/2020 07/29/2020 03/24/2020 05/27/2015  PHQ - 2 Score 0 0 0 0 2 0 0  PHQ- 9 Score 0 0 - - 6 0 -    Fall Risk Fall Risk  05/15/2021 04/06/2021 02/11/2021 09/02/2020 07/29/2020  Falls in the past year? 0 0 0 0 Exclusion - non ambulatory  Comment - - - - has not fallen in the past year and is bedbound  Number falls in past yr: 0 0 0 0 -  Injury with Fall? 0 0 - 0 -  Risk for fall due to : No Fall Risks Medication side effect;Impaired balance/gait;Impaired mobility - - Impaired mobility  Follow up Falls evaluation completed Falls evaluation completed;Falls prevention discussed - Falls evaluation completed -    FALL RISK PREVENTION PERTAINING TO THE HOME:  Any stairs in or around the home? Yes  If so, are there any without handrails? Yes  Home free of loose throw rugs in walkways, pet beds, electrical cords, etc? No  Adequate lighting in your home to reduce risk of falls? Yes   ASSISTIVE DEVICES UTILIZED TO PREVENT FALLS:  Life alert? Yes  Use of a  cane, walker or w/c? No  Grab bars in the bathroom? No Shower chair or bench in shower? No  Elevated toilet seat or a handicapped toilet? No   TIMED UP AND GO:  Was the test performed?  N/A .  Length of time to ambulate 10 feet: N/A sec.    Cognitive Function: MMSE - Mini Mental State Exam 04/06/2021 03/24/2020  Not completed: Refused Refused     6CIT Screen 05/15/2021  What Year? 0 points  What month? 0 points  What time? 0 points  Count back from 20 0 points  Months in reverse 0 points  Repeat phrase 0 points  Total Score 0    Immunizations Immunization History  Administered Date(s) Administered   Tdap 02/03/2012    TDAP status: Up to date  Flu Vaccine status: Due, Education has been provided regarding the importance of this vaccine. Advised may receive this vaccine at local pharmacy or Health Dept. Aware to provide a copy of the vaccination record if obtained from local pharmacy or Health Dept. Verbalized acceptance and understanding.  Pneumococcal vaccine status: Declined,  Education has been provided regarding the importance of this vaccine but patient still declined. Advised may receive this vaccine at local pharmacy or Health Dept. Aware to provide a copy of the vaccination record if obtained from local pharmacy or Health Dept. Verbalized acceptance and understanding.   Covid-19 vaccine status: Declined, Education has been provided regarding the importance of this vaccine but patient still declined. Advised may receive this vaccine at local pharmacy or Health Dept.or vaccine clinic. Aware to provide a copy of the vaccination record if obtained from local pharmacy or Health Dept. Verbalized acceptance and understanding.  Qualifies for Shingles Vaccine? No   Zostavax completed  n/a   Shingrix Completed?: No.    Education has been provided regarding the importance of this vaccine. Patient has been advised to call insurance company to determine out of pocket expense if they  have not yet received this vaccine. Advised may also receive vaccine at local pharmacy or Health Dept. Verbalized acceptance and understanding.  Screening Tests Health Maintenance  Topic Date Due   PNEUMOCOCCAL POLYSACCHARIDE VACCINE AGE 19-64 HIGH RISK  Never done   COVID-19 Vaccine (1) Never done   FOOT EXAM  Never done   OPHTHALMOLOGY EXAM  Never done   URINE MICROALBUMIN  Never done   Hepatitis C Screening  Never done   COLONOSCOPY (Pts 45-48yrs Insurance coverage will need to be confirmed)  Never done   HEMOGLOBIN A1C  09/08/2021   TETANUS/TDAP  02/02/2022   HIV Screening  Completed   Pneumococcal Vaccine 77-75 Years old  Aged Out   HPV VACCINES  Aged Out   INFLUENZA VACCINE  Discontinued    Health Maintenance  Health Maintenance Due  Topic Date Due   PNEUMOCOCCAL POLYSACCHARIDE VACCINE AGE 19-64 HIGH RISK  Never done   COVID-19 Vaccine (1) Never done   FOOT EXAM  Never done   OPHTHALMOLOGY EXAM  Never done   URINE MICROALBUMIN  Never done   Hepatitis C Screening  Never done   COLONOSCOPY (Pts 45-49yrs Insurance coverage will need to be confirmed)  Never done    Colorectal Screening: Patient says he is bedridden and cannot do this.  Lung Cancer Screening: (Low Dose CT Chest recommended if Age 45-80 years, 30 pack-year currently smoking OR have quit w/in 15years.) does qualify.   Lung Cancer Screening Referral: Yes  Additional Screening:  Hepatitis C Screening: does qualify; Completed - DUE  Vision Screening: Recommended annual ophthalmology exams for early detection of glaucoma and other disorders of the eye. Is the patient up to date with their annual eye exam?  No  Who is the provider or what is the name of the office in which the patient attends annual eye exams? N/A If pt is not established with a provider, would they like to be referred to a provider to establish care? No .   Dental Screening: Recommended annual dental exams for proper oral hygiene  Community  Resource Referral / Chronic Care Management: CRR required this visit?  Yes   CCM required this visit?  Yes      Plan: CT LUNG CANCER SCREENING ORDERED COMMUNITY RESOURCE REFERRAL for transportation     I have personally reviewed and noted the following in the patient's chart:   Medical and social history Use of alcohol, tobacco or illicit drugs  Current medications and supplements including opioid prescriptions. Patient is currently taking opioid prescriptions. Information provided to patient regarding non-opioid alternatives. Patient advised to discuss non-opioid treatment plan with their provider. Functional ability and status Nutritional status Physical activity Advanced directives List of other physicians Hospitalizations, surgeries, and ER visits in previous 12 months Vitals Screenings to include cognitive, depression, and falls Referrals and appointments  In addition, I have reviewed and discussed with patient certain preventive protocols, quality metrics, and best practice recommendations. A written personalized care plan for preventive services as well as general preventive health recommendations were provided to patient.     Mariel Sleet, CMA   05/15/2021   Nurse Notes: Non-face to face visit for 10 minutes. Placed referral for CT Lung Cancer Screening Placed referral for transportation services. Pt says he has weight loss surgery Sept 7th at 3:30 PM and will need help with transportation for this.   Mr. Ticas , Thank you for taking time to come for your Medicare Wellness Visit. I appreciate your ongoing commitment to your health goals. Please review the following plan we discussed and let me know if I can assist you in the future.    This  is a list of the screening recommended for you and due dates:  Health Maintenance  Topic Date Due   Pneumococcal vaccine  Never done   COVID-19 Vaccine (1) Never done   Complete foot exam   Never done   Eye exam for  diabetics  Never done   Urine Protein Check  Never done   Hepatitis C Screening: USPSTF Recommendation to screen - Ages 25-79 yo.  Never done   Colon Cancer Screening  Never done   Hemoglobin A1C  09/08/2021   Tetanus Vaccine  02/02/2022   HIV Screening  Completed   Pneumococcal Vaccination  Aged Out   HPV Vaccine  Aged Out   Flu Shot  Discontinued

## 2021-05-16 DIAGNOSIS — M1611 Unilateral primary osteoarthritis, right hip: Secondary | ICD-10-CM | POA: Diagnosis not present

## 2021-05-17 ENCOUNTER — Telehealth: Payer: Self-pay | Admitting: *Deleted

## 2021-05-17 ENCOUNTER — Ambulatory Visit: Payer: Medicare HMO | Admitting: *Deleted

## 2021-05-17 ENCOUNTER — Ambulatory Visit: Payer: Medicare HMO

## 2021-05-17 ENCOUNTER — Other Ambulatory Visit: Payer: Self-pay | Admitting: Family Medicine

## 2021-05-17 DIAGNOSIS — M25561 Pain in right knee: Secondary | ICD-10-CM

## 2021-05-17 DIAGNOSIS — F330 Major depressive disorder, recurrent, mild: Secondary | ICD-10-CM | POA: Diagnosis not present

## 2021-05-17 DIAGNOSIS — G4733 Obstructive sleep apnea (adult) (pediatric): Secondary | ICD-10-CM

## 2021-05-17 DIAGNOSIS — M25551 Pain in right hip: Secondary | ICD-10-CM

## 2021-05-17 DIAGNOSIS — I10 Essential (primary) hypertension: Secondary | ICD-10-CM

## 2021-05-17 DIAGNOSIS — F419 Anxiety disorder, unspecified: Secondary | ICD-10-CM | POA: Diagnosis not present

## 2021-05-17 DIAGNOSIS — M1611 Unilateral primary osteoarthritis, right hip: Secondary | ICD-10-CM | POA: Diagnosis not present

## 2021-05-17 DIAGNOSIS — E119 Type 2 diabetes mellitus without complications: Secondary | ICD-10-CM

## 2021-05-17 DIAGNOSIS — G8929 Other chronic pain: Secondary | ICD-10-CM

## 2021-05-17 DIAGNOSIS — Z7401 Bed confinement status: Secondary | ICD-10-CM

## 2021-05-17 DIAGNOSIS — F339 Major depressive disorder, recurrent, unspecified: Secondary | ICD-10-CM

## 2021-05-17 NOTE — Chronic Care Management (AMB) (Signed)
Chronic Care Management   CCM RN Visit Note  05/17/2021 Name: Michael Payne MRN: [id number] DOB: 10-17-1970  Subjective: Michael Payne is a 50 y.o. year old male who is a primary care patient of [name], MD. The care management team was consulted for assistance with disease management and care coordination needs.    Engaged with patient by telephone for follow up visit in response to provider referral for case management and/or care coordination services.   Consent to Services:  The patient was given the following information about Chronic Care Management services today, agreed to services, and gave verbal consent: 1. CCM service includes personalized support from designated clinical staff supervised by the primary care provider, including individualized plan of care and coordination with other care providers 2. 24/7 contact phone numbers for assistance for urgent and routine care needs. 3. Service will only be billed when office clinical staff spend 20 minutes or more in a month to coordinate care. 4. Only one practitioner may furnish and bill the service in a calendar month. 5.The patient may stop CCM services at any time (effective at the end of the month) by phone call to the office staff. 6. The patient will be responsible for cost sharing (co-pay) of up to 20% of the service fee (after annual deductible is met). Patient agreed to services and consent obtained.  Patient agreed to services and verbal consent obtained.   Assessment: Review of patient past medical history, allergies, medications, health status, including review of consultants reports, laboratory and other test data, was performed as part of comprehensive evaluation and provision of chronic care management services.   SDOH (Social Determinants of Health) assessments and interventions performed:    CCM Care Plan  Allergies  Allergen Reactions   Aspirin Other (See Comments)    Upset stomach   Lactose Intolerance  (Gi)     Outpatient Encounter Medications as of 05/17/2021  Medication Sig   allopurinol (ZYLOPRIM) 300 MG tablet TAKE 1 TABLET TWICE DAILY   amLODipine (NORVASC) 10 MG tablet Take 1 tablet (10 mg total) by mouth daily.   colchicine (COLCRYS) 0.6 MG tablet Take 1 tablet (0.6 mg total) by mouth daily as needed.   diclofenac (VOLTAREN) 75 MG EC tablet TAKE 1 TABLET BY MOUTH 2 TIMES DAILY AS NEEDED. TAKE WITH FOOD.   Dulaglutide (TRULICITY) 2.50 IB/7.0WU SOPN Inject 0.75 mg into the skin once a week.   lidocaine (LIDODERM) 5 % PLACE 1 PATCH ONTO THE SKIN DAILY. REMOVE & DISCARD PATCH WITHIN 12 HOURS   naloxone (NARCAN) nasal spray 4 mg/0.1 mL Spray nostril if needed for opioid overuse- decreased consciousness, decreased respirations   oxyCODONE-acetaminophen (PERCOCET) 10-325 MG tablet Take 1 tablet by mouth every 6 (six) hours as needed for pain.   tiZANidine (ZANAFLEX) 4 MG tablet Take 1 tablet (4 mg total) by mouth every 8 (eight) hours as needed for muscle spasms.   vitamin C (ASCORBIC ACID) 500 MG tablet Take 500 mg by mouth daily.   No facility-administered encounter medications on file as of 05/17/2021.    Patient Active Problem List   Diagnosis Date Noted   Gout, unspecified 03/10/2021   Edema 03/10/2021   Diabetes mellitus without complication ([place]) 88/91/6945   Muscle spasms of both lower extremities 07/29/2020   Bedbound 05/22/2020   Unilateral osteoarthritis of hip, right 12/27/2019   Severe obstructive sleep apnea 02/22/2018   Hypertriglyceridemia 08/24/2017   Low HDL (under 40) 03/88/8280   Metabolic syndrome 03/49/1791  Morbid obesity ([place]) 10/14/2013   HTN (hypertension) 10/14/2013    Conditions to be addressed/monitored:HTN and Osteoarthritis/chronic pain  Care Plan : Chronic pain due to osteoarthritis  Updates made by Dannielle Karvonen, RN since 05/17/2021 12:00 AM     Problem: Decreased mobility due to osteoarthritis pain and obesity   Priority: High      Long-Range Goal: Patient will perform physical activity within limits of disease   Start Date: 02/11/2021  Expected End Date: 08/18/2021  This Visit's Progress: On track  Recent Progress: On track  Priority: High  Note:   Current Barriers: Patient states he continues to receive home health physical therapy.  He reports working on [employer] transfers to wheelchair  with therapist and states family and aid are also being trained on how to use lift. Patient states he is able to sit in wheelchair for approximately 20-30 minutes.  He states he continues to have hip pain that ranges from 6 to 7 on a scale of 1 to 10. He states he is scheduled to follow up with the bariatric surgeon on 05/28/2021. Care guide to assist with [facility] authorization for  EMS transportation if needed.  Patient states he continues to do home exercises on non home health therapy days. He states he is currently taking Trulicity which has helped to curb his appetite and is not on a Keto diet.  Knowledge Deficits related to self-health management of chronic pain Chronic Disease Management support and education needs related to chronic pain Transportation barriers  Not attend all scheduled provider appointments Unable to perform ADLs independently: Patient continues with PCS services 2 1/2 hours a day, 7 days a week.  Unable to perform IADLs independently.  Clinical Goal(s):  patient will verbalize understanding of plan for pain management. Patient states he takes his pain medication as prescribed. RNCM advised patient to consider taking pain medication prior to physical and/ or occupational therapy visits.  patient will report pain at a level less than 3 to 4 on a 10-10 rating scale., patient will use pharmacological and nonpharmacological pain relief strategies as prescribed. patient will verbalize acceptable level of pain relief and ability to engage in desired activities Patient will work on healthy eating for weight loss   Interventions:  Collaboration with [name], MD regarding development and update of comprehensive plan of care as evidenced by provider attestation and co-signature:   Inter-disciplinary care team collaboration (see longitudinal plan of care):  Telephone call to [place] regarding patients CPAP concerns. Spoke with Glenard Haring in the CPAP division who states she will have a respiratory therapist return call to discuss.  Pain assessment performed Discussed plans with patient for ongoing care management follow up and provided patient with direct contact information for care management team Reviewed medications with patient and discussed  Reviewed scheduled/upcoming provider appointments Notified social worker patient would like counseling resources for coping with chronic pain. Due to bed bound status, advised patient to turn frequently to prevent skin breakdown.  Reviewed skin breakdown signs/ symptoms.   Patient Goals/Self Care Activities:  Continue to take your medications as prescribed. Refill your medications timely Attend doctors visits as recommended.  (virtually and/ or in person when able) Call your provider office for new concerns or questions Continue to turn frequently in bed to prevent skin breakdown. Be aware of signs of skin break down: redness and skin tears.  Report any new symptoms to your primary care provider and/ or wound care physician.   Continue to  perform exercises provided to you by home physical therapist. Continue to work with home health physical therapy. Perform home exercises as instructed by your physical / occupational therapist.  Apply hot or cold pack for pain relief to affected area Use assistive devices as recommended by your provider and physical therapist Continue to incorporate healthy food choices to assist with weight loss.  Follow Up Plan: The patient has been provided with contact information for the care management team and has been advised to call  with any health related questions or concerns.  The care management team will reach out to the patient again over the next 30 days.          Plan:The patient has been provided with contact information for the care management team and has been advised to call with any health related questions or concerns.  and The care management team will reach out to the patient again over the next 2 months. Quinn Plowman RN,BSN,CCM RN Case Manager [place]  [phone]

## 2021-05-17 NOTE — Patient Instructions (Signed)
Visit Information  PATIENT GOALS:  Goals Addressed               This Visit's Progress     COMPLETED: Find Help in My Community        Timeframe:  Long-Range Goal Priority:  High Start Date:    03/15/21                         Expected End Date: 06/18/2021                      Follow Up Date 04/21/2021   -follow up with [facility] office re: staffing of hours, etc.  -follow up with Bariatric Coordinator for next steps  - begin a notebook of services in my neighborhood or community - call 211 when I need some help - follow-up on any referrals for help I am given - think ahead to make sure my need does not become an emergency - make a note about what I need to have by the phone or take with me, like an identification card or social security number have a back-up plan - have a back-up plan - make a list of family or friends that I can call    Why is this important?   Knowing how and where to find help for yourself or family in your neighborhood and community is an important skill.  You will want to take some steps to learn how.    Notes:       Need help to get out of house and to Dr Lianne Bushy (pt-stated)        Timeframe:  Long-Range Goal Priority:  High Start Date:       01/25/2021                      Expected End Date:  06/18/2021                     Follow Up Date 06/10/2021    -continue with  virtual  counseling - keep 90 percent of counseling appointments - schedule counseling appointment  -follow up with [facility] office re: staffing of hours, etc.  -follow up with Bariatric Coordinator for next steps  - begin a notebook of services in my neighborhood or community - call 211 when I need some help - follow-up on any referrals for help I am given - think ahead to make sure my need does not become an emergency - make a note about what I need to have by the phone or take with me, like an identification card or social security number have a back-up plan - have a back-up plan -  make a list of family or friends that I can call -continue working with Home Health PT  Why is this important?   Knowing how and where to find help for yourself or family in your neighborhood and community is an important skill.  You will want to take some steps to learn how.    Notes:         The patient verbalized understanding of instructions, educational materials, and care plan provided today and declined offer to receive copy of patient instructions, educational materials, and care plan.   Telephone follow up appointment with care management team member scheduled for:06/11/21  Reece Levy MSW, LCSW Licensed Clinical Social Worker [facility] [phone]

## 2021-05-17 NOTE — Telephone Encounter (Signed)
   Telephone encounter was:  Successful.  05/17/2021 Name: DELRAY REZA MRN: [id number] DOB: 1971/03/13  DILLARD PASCAL is a 50 y.o. year old male who is a primary care patient of Joaquim Nam, MD . The community resource team was consulted for assistance with Transportation Needs   Care guide performed the following interventions: Outreached about Transportation. Pt advised he is bedbound and is unable to use [employer] as he is not able to use to a wheelchair or walker until after his weight loss surgery. Per pt stretcher transportation will be needed.  Follow Up Plan:  Patient provided with information about care guide support team and interviewed to confirm resource needsCare guide will follow up with patient by phone over the next few days.  Alois Cliche -[facility] Guide , Embedded Care Coordination [facility], Care Management  [phone] [address] [place] , [place] [address] Email : [email or web address]

## 2021-05-17 NOTE — Patient Instructions (Signed)
Visit Information:  Thank your for taking the time to speak with me today.  PATIENT GOALS:  Goals Addressed             This Visit's Progress    Pain Management Plan Developed ( osteroarthritis/ chronic pain)   On track    Timeframe:  Long-Range Goal Priority:  High Start Date:      02/11/2021                       Expected End Date: 08/18/2021  Follow up: 07/02/2021  Patient goals  Continue to take your medications as prescribed. Refill your medications timely Attend doctors visits as recommended.  (virtually and/ or in person when able) Call your provider office for new concerns or questions Continue to turn frequently in bed to prevent skin breakdown. Be aware of signs of skin break down: redness and skin tears.  Report any new symptoms to your primary care provider and/ or wound care physician.   Continue to perform exercises provided to you by home physical therapist. Continue to work with home health physical therapy. Perform home exercises as instructed by your physical / occupational therapist.  Apply hot or cold pack for pain relief to affected area Use assistive devices as recommended by your provider and physical therapist  Continue to incorporate healthy food choices to assist with weight loss.          Patient verbalizes understanding of instructions provided today and agrees to view in MyChart.   The patient has been provided with contact information for the care management team and has been advised to call with any health related questions or concerns.  The care management team will reach out to the patient again over the next 45 days.   George Ina RN,BSN,CCM RN Case Manager [facility] [place]  [phone]

## 2021-05-17 NOTE — Chronic Care Management (AMB) (Signed)
Chronic Care Management    Clinical Social Work Note  05/17/2021 Name: Michael Payne MRN: [id number] DOB: Nov 20, 1970  Michael Payne is a 50 y.o. year old male who is a primary care patient of [name], MD. The CCM team was consulted to assist the patient with chronic disease management and/or care coordination needs related to: Transportation Needs , [employer] , and Mental Health Counseling and Resources.   Engaged with patient by telephone for follow up visit in response to provider referral for social work chronic care management and care coordination services.   Consent to Services:  The patient was given information about Chronic Care Management services, agreed to services, and gave verbal consent prior to initiation of services.  Please see initial visit note for detailed documentation.   Patient agreed to services and consent obtained.   Assessment: Review of patient past medical history, allergies, medications, and health status, including review of relevant consultants reports was performed today as part of a comprehensive evaluation and provision of chronic care management and care coordination services.     SDOH (Social Determinants of Health) assessments and interventions performed:    Advanced Directives Status: Not addressed in this encounter.  CCM Care Plan  Allergies  Allergen Reactions   Aspirin Other (See Comments)    Upset stomach   Lactose Intolerance (Gi)     Outpatient Encounter Medications as of 05/17/2021  Medication Sig   allopurinol (ZYLOPRIM) 300 MG tablet TAKE 1 TABLET TWICE DAILY   amLODipine (NORVASC) 10 MG tablet Take 1 tablet (10 mg total) by mouth daily.   colchicine (COLCRYS) 0.6 MG tablet Take 1 tablet (0.6 mg total) by mouth daily as needed.   diclofenac (VOLTAREN) 75 MG EC tablet TAKE 1 TABLET BY MOUTH 2 TIMES DAILY AS NEEDED. TAKE WITH FOOD.   Dulaglutide (TRULICITY) 0.75 MG/0.5ML SOPN Inject 0.75 mg into the skin once a  week.   lidocaine (LIDODERM) 5 % PLACE 1 PATCH ONTO THE SKIN DAILY. REMOVE & DISCARD PATCH WITHIN 12 HOURS   naloxone (NARCAN) nasal spray 4 mg/0.1 mL Spray nostril if needed for opioid overuse- decreased consciousness, decreased respirations   oxyCODONE-acetaminophen (PERCOCET) 10-325 MG tablet Take 1 tablet by mouth every 6 (six) hours as needed for pain.   tiZANidine (ZANAFLEX) 4 MG tablet Take 1 tablet (4 mg total) by mouth every 8 (eight) hours as needed for muscle spasms.   vitamin C (ASCORBIC ACID) 500 MG tablet Take 500 mg by mouth daily.   No facility-administered encounter medications on file as of 05/17/2021.    Patient Active Problem List   Diagnosis Date Noted   Gout, unspecified 03/10/2021   Edema 03/10/2021   Diabetes mellitus without complication (HCC) 03/10/2021   Muscle spasms of both lower extremities 07/29/2020   Bedbound 05/22/2020   Unilateral osteoarthritis of hip, right 12/27/2019   Severe obstructive sleep apnea 02/22/2018   Hypertriglyceridemia 08/24/2017   Low HDL (under 40) 08/24/2017   Metabolic syndrome 08/24/2017   Morbid obesity (HCC) 10/14/2013   HTN (hypertension) 10/14/2013    Conditions to be addressed/monitored: Anxiety, Depression, and transportation, bariatric needs ; Transportation and Mental Health Concerns   Care Plan : Development of Plan for Management of Functional Limitations and Needs  Updates made by Buck Mam, LCSW since 05/17/2021 12:00 AM     Problem: Social and Functional Symptoms      Long-Range Goal: Optimize pt's quality of life and opportunities   Start Date: 01/25/2021  Expected  End Date: 07/19/2021  This Visit's Progress: On track  Recent Progress: On track  Priority: High  Note:   Current barriers:   Transportation, Housing barriers, Level of care concerns, ADL IADL limitations, Social Isolation, Limited access to caregiver, Inability to perform ADL's independently, Inability to perform IADL's independently, and  Lacks knowledge of community resource Clinical Goals: Patient will work with CSW  to address needs related to transportation, PCS/CAPS and other needs as assessed and identified Clinical Interventions:  CSW spoke with pt who reports he is still receiving HHPT- they have been able to work on [employer] transfers to wheelchair and per pt are training family.  Pt has also begun mental health counseling support and states he has a tele visit today.  Pt is planning to attend a consultation appointment at [facility] for possible bariatric surgery on 05/28/21.  Noted Care Guide is assisting and will update PCP and Care Guide of the necessary steps to seek [facility] authorization for EMS ride if that is needed.  Pt is possibly able to be transported by wheelchair Zenaida Niece if family can assist with getting him transferred to wheelchair by hoyer lift.  CSW encouraged pt to continue with his PT and counseling support and other treatment plans in place.   CSW will email pt the info for [facility] transportation auth request as well as send to PCP and Care Guide. PCP office to initiate a Tree surgeon" through his insurance company by calling [phone] and completing necessary steps for EMS authorized two-way nonemergent EMS rides.   \ Collaboration with Joaquim Nam, MD regarding development and update of comprehensive plan of care as evidenced by provider attestation and co-signature Inter-disciplinary care team collaboration (see longitudinal plan of care) Assessment of needs, barriers , agencies contacted, as well as how impacting  Review various resources, discussed options and provided patient information about Department of Social Services (food stamps, Medicaid, and utilities assistance), Transportation provided by insurance provider, Enhanced Benefits connected with insurance provider, Referral to care guide for community support and other options, SCAT transportation, and [employer] Alternative Program CAP Patient  interviewed and appropriate assessments performed Referred patient to community resources care guide team for assistance with ramps (building/ex[expense,etc)  Provided mental health counseling with regard to pt's current isolation and being homebound/bedbound Provided patient with information about SCAT, CAPS, transportation resources Discussed plans with patient for ongoing care management follow up and provided patient with direct contact information for care management team Advised patient to call DSS worker to request CAPS services Advised patient to complete PCS application/request for increase in hours provided Collaborated with primary care provider re: insurance prior auth process for stretcher transportation Assisted patient/caregiver with obtaining information about health plan benefits Other interventions provided:Solution-Focused Strategies, Active listening / Reflection utilized , Emotional Supportive Provided, Problem Solving /Task Center , Psychoeducation /Health Education, Motivational Interviewing, Brief CBT , Increase in activities / exercise encouraged (with HHPT), and Provided basic mental health support, education   Patient Goals/Self-Care Activities: Over the next 30 days  Follow Up Date 06/11/21  -continue with  virtual  counseling - keep 90 percent of counseling appointments - schedule counseling appointment  -follow up with [facility] office re: staffing of hours, etc.  -follow up with Bariatric Coordinator for next steps  - begin a notebook of services in my neighborhood or community - call 211 when I need some help - follow-up on any referrals for help I am given - think ahead to make sure my need does not become an  emergency - make a note about what I need to have by the phone or take with me, like an identification card or social security number have a back-up plan - have a back-up plan - make a list of family or friends that I can call -continue working with Home Health  PT      Follow Up Plan: Appointment scheduled for SW follow up with client by phone on: 06/11/21      Reece Levy MSW, LCSW Licensed Clinical Social Worker [facility] [place] [phone]

## 2021-05-18 DIAGNOSIS — R269 Unspecified abnormalities of gait and mobility: Secondary | ICD-10-CM | POA: Diagnosis not present

## 2021-05-18 DIAGNOSIS — K219 Gastro-esophageal reflux disease without esophagitis: Secondary | ICD-10-CM | POA: Diagnosis not present

## 2021-05-18 DIAGNOSIS — Z9181 History of falling: Secondary | ICD-10-CM | POA: Diagnosis not present

## 2021-05-18 DIAGNOSIS — M1611 Unilateral primary osteoarthritis, right hip: Secondary | ICD-10-CM | POA: Diagnosis not present

## 2021-05-18 DIAGNOSIS — Z87891 Personal history of nicotine dependence: Secondary | ICD-10-CM | POA: Diagnosis not present

## 2021-05-18 DIAGNOSIS — M1631 Unilateral osteoarthritis resulting from hip dysplasia, right hip: Secondary | ICD-10-CM | POA: Diagnosis not present

## 2021-05-18 DIAGNOSIS — D649 Anemia, unspecified: Secondary | ICD-10-CM | POA: Diagnosis not present

## 2021-05-18 DIAGNOSIS — Z6841 Body Mass Index (BMI) 40.0 and over, adult: Secondary | ICD-10-CM | POA: Diagnosis not present

## 2021-05-18 DIAGNOSIS — I10 Essential (primary) hypertension: Secondary | ICD-10-CM | POA: Diagnosis not present

## 2021-05-18 DIAGNOSIS — M150 Primary generalized (osteo)arthritis: Secondary | ICD-10-CM | POA: Diagnosis not present

## 2021-05-18 DIAGNOSIS — G4733 Obstructive sleep apnea (adult) (pediatric): Secondary | ICD-10-CM | POA: Diagnosis not present

## 2021-05-18 NOTE — Telephone Encounter (Signed)
Refill request for Lidocaine 5% patches  LOV - 03/09/21 Next OV - not scheduled Last refill - 02/10/21 #30/2

## 2021-05-19 DIAGNOSIS — M1611 Unilateral primary osteoarthritis, right hip: Secondary | ICD-10-CM | POA: Diagnosis not present

## 2021-05-19 NOTE — Telephone Encounter (Signed)
Sent. Thanks.   

## 2021-05-20 DIAGNOSIS — I509 Heart failure, unspecified: Secondary | ICD-10-CM

## 2021-05-20 DIAGNOSIS — G4733 Obstructive sleep apnea (adult) (pediatric): Secondary | ICD-10-CM | POA: Diagnosis not present

## 2021-05-20 DIAGNOSIS — J189 Pneumonia, unspecified organism: Secondary | ICD-10-CM

## 2021-05-20 DIAGNOSIS — Z7401 Bed confinement status: Secondary | ICD-10-CM | POA: Diagnosis not present

## 2021-05-20 DIAGNOSIS — M1611 Unilateral primary osteoarthritis, right hip: Secondary | ICD-10-CM | POA: Diagnosis not present

## 2021-05-20 HISTORY — DX: Pneumonia, unspecified organism: J18.9

## 2021-05-20 HISTORY — DX: Heart failure, unspecified: I50.9

## 2021-05-21 DIAGNOSIS — M1611 Unilateral primary osteoarthritis, right hip: Secondary | ICD-10-CM | POA: Diagnosis not present

## 2021-05-22 DIAGNOSIS — M1611 Unilateral primary osteoarthritis, right hip: Secondary | ICD-10-CM | POA: Diagnosis not present

## 2021-05-25 ENCOUNTER — Ambulatory Visit (INDEPENDENT_AMBULATORY_CARE_PROVIDER_SITE_OTHER): Payer: Medicare HMO | Admitting: *Deleted

## 2021-05-25 DIAGNOSIS — E119 Type 2 diabetes mellitus without complications: Secondary | ICD-10-CM

## 2021-05-25 DIAGNOSIS — Z7401 Bed confinement status: Secondary | ICD-10-CM

## 2021-05-25 DIAGNOSIS — R32 Unspecified urinary incontinence: Secondary | ICD-10-CM | POA: Diagnosis not present

## 2021-05-25 DIAGNOSIS — M1611 Unilateral primary osteoarthritis, right hip: Secondary | ICD-10-CM | POA: Diagnosis not present

## 2021-05-25 DIAGNOSIS — G8929 Other chronic pain: Secondary | ICD-10-CM

## 2021-05-25 NOTE — Chronic Care Management (AMB) (Signed)
Chronic Care Management    Clinical Social Work Note  05/25/2021 Name: Michael Payne MRN: [id number] DOB: Aug 22, 1971  Michael Payne is a 50 y.o. year old male who is a primary care patient of Joaquim Nam, MD. The CCM team was consulted to assist the patient with chronic disease management and/or care coordination needs related to: Transportation Needs .   Engaged with patient by telephone for follow up visit in response to provider referral for social work chronic care management and care coordination services.   Consent to Services:  The patient was given information about Chronic Care Management services, agreed to services, and gave verbal consent prior to initiation of services.  Please see initial visit note for detailed documentation.   Patient agreed to services and consent obtained.   Assessment: Review of patient past medical history, allergies, medications, and health status, including review of relevant consultants reports was performed today as part of a comprehensive evaluation and provision of chronic care management and care coordination services.     SDOH (Social Determinants of Health) assessments and interventions performed:    Advanced Directives Status: Not addressed in this encounter.  CCM Care Plan  Allergies  Allergen Reactions   Aspirin Other (See Comments)    Upset stomach   Lactose Intolerance (Gi)     Outpatient Encounter Medications as of 05/25/2021  Medication Sig   allopurinol (ZYLOPRIM) 300 MG tablet TAKE 1 TABLET TWICE DAILY   amLODipine (NORVASC) 10 MG tablet Take 1 tablet (10 mg total) by mouth daily.   colchicine (COLCRYS) 0.6 MG tablet Take 1 tablet (0.6 mg total) by mouth daily as needed.   diclofenac (VOLTAREN) 75 MG EC tablet TAKE 1 TABLET BY MOUTH 2 TIMES DAILY AS NEEDED. TAKE WITH FOOD.   Dulaglutide (TRULICITY) 0.75 MG/0.5ML SOPN Inject 0.75 mg into the skin once a week.   lidocaine (LIDODERM) 5 % PLACE 1 PATCH ONTO THE SKIN DAILY.  REMOVE & DISCARD PATCH WITHIN 12 HOURS   naloxone (NARCAN) nasal spray 4 mg/0.1 mL Spray nostril if needed for opioid overuse- decreased consciousness, decreased respirations   oxyCODONE-acetaminophen (PERCOCET) 10-325 MG tablet Take 1 tablet by mouth every 6 (six) hours as needed for pain.   tiZANidine (ZANAFLEX) 4 MG tablet Take 1 tablet (4 mg total) by mouth every 8 (eight) hours as needed for muscle spasms.   vitamin C (ASCORBIC ACID) 500 MG tablet Take 500 mg by mouth daily.   No facility-administered encounter medications on file as of 05/25/2021.    Patient Active Problem List   Diagnosis Date Noted   Gout, unspecified 03/10/2021   Edema 03/10/2021   Diabetes mellitus without complication (HCC) 03/10/2021   Muscle spasms of both lower extremities 07/29/2020   Bedbound 05/22/2020   Unilateral osteoarthritis of hip, right 12/27/2019   Severe obstructive sleep apnea 02/22/2018   Hypertriglyceridemia 08/24/2017   Low HDL (under 40) 08/24/2017   Metabolic syndrome 08/24/2017   Morbid obesity (HCC) 10/14/2013   HTN (hypertension) 10/14/2013    Conditions to be addressed/monitored:  bariatric needs ; Transportation, Housing barriers, and Inability to perform ADL's independently  Care Plan : Development of Plan for Management of Functional Limitations and Needs  Updates made by Buck Mam, LCSW since 05/25/2021 12:00 AM     Problem: Social and Functional Symptoms      Long-Range Goal: Optimize pt's quality of life and opportunities   Start Date: 01/25/2021  Expected End Date: 07/19/2021  This Visit's Progress: On track  Recent Progress: On track  Priority: High  Note:   Current barriers:   Transportation, Housing barriers, Level of care concerns, ADL IADL limitations, Social Isolation, Limited access to caregiver, Inability to perform ADL's independently, Inability to perform IADL's independently, and Lacks knowledge of community resource Clinical Goals: Patient will work  with CSW  to address needs related to transportation, PCS/CAPS and other needs as assessed and identified Clinical Interventions:  05/25/21 CSW coordinated with [employer] Surgery office staff (April) to get insurance approval for EMS ride to and from home for appointment at [facility] on 05/28/21. Auth # [id number].  CSW has advised pt and he will coordinate the Firefighters coming to assist with getting him out of house on that day.   spoke with pt who reports he is still receiving HHPT- they have been able to work on [employer] transfers to wheelchair and per pt are training family.  Pt has also begun mental health counseling support and states he has a tele visit today.  Pt is planning to attend a consultation appointment at [facility] for possible bariatric surgery on 05/28/21.  Noted Care Guide is assisting and will update PCP and Care Guide of the necessary steps to seek [facility] authorization for EMS ride if that is needed.  Pt is possibly able to be transported by wheelchair Zenaida Niece if family can assist with getting him transferred to wheelchair by hoyer lift.  CSW encouraged pt to continue with his PT and counseling support and other treatment plans in place.   CSW will email pt the info for [facility] transportation auth request as well as send to PCP and Care Guide. PCP office to initiate a Tree surgeon" through his insurance company by calling [phone] and completing necessary steps for EMS authorized two-way nonemergent EMS rides.   \ Collaboration with Joaquim Nam, MD regarding development and update of comprehensive plan of care as evidenced by provider attestation and co-signature Inter-disciplinary care team collaboration (see longitudinal plan of care) Assessment of needs, barriers , agencies contacted, as well as how impacting  Review various resources, discussed options and provided patient information about Department of Social Services (food stamps, Medicaid, and utilities assistance),  Transportation provided by insurance provider, Enhanced Benefits connected with insurance provider, Referral to care guide for community support and other options, SCAT transportation, and [employer] Alternative Program CAP Patient interviewed and appropriate assessments performed Referred patient to community resources care guide team for assistance with ramps (building/ex[expense,etc)  Provided mental health counseling with regard to pt's current isolation and being homebound/bedbound Provided patient with information about SCAT, CAPS, transportation resources Discussed plans with patient for ongoing care management follow up and provided patient with direct contact information for care management team Advised patient to call DSS worker to request CAPS services Advised patient to complete PCS application/request for increase in hours provided Collaborated with primary care provider re: insurance prior auth process for stretcher transportation Assisted patient/caregiver with obtaining information about health plan benefits Other interventions provided:Solution-Focused Strategies, Active listening / Reflection utilized , Emotional Supportive Provided, Problem Solving /Task Center , Psychoeducation /Health Education, Motivational Interviewing, Brief CBT , Increase in activities / exercise encouraged (with HHPT), and Provided basic mental health support, education   Patient Goals/Self-Care Activities: Over the next 30 days  Follow Up Date 06/11/21 Expect EMS pickup for MD appointment on 05/28/21 (approved) - keep 90 percent of counseling appointments - schedule counseling appointment  -follow up with [facility] office re: staffing of hours, etc.  -follow up with Bariatric  Coordinator for next steps  - begin a notebook of services in my neighborhood or community - call 211 when I need some help - follow-up on any referrals for help I am given - think ahead to make sure my need does not become an  emergency - make a note about what I need to have by the phone or take with me, like an identification card or social security number have a back-up plan - have a back-up plan - make a list of family or friends that I can call -continue working with Home Health PT   -continue with  virtual  counseling - keep 90 percent of counseling appointments - schedule counseling appointment  -follow up with [facility] office re: staffing of hours, etc.  -follow up with Bariatric Coordinator for next steps  - begin a notebook of services in my neighborhood or community - call 211 when I need some help - follow-up on any referrals for help I am given - think ahead to make sure my need does not become an emergency - make a note about what I need to have by the phone or take with me, like an identification card or social security number have a back-up plan - have a back-up plan - make a list of family or friends that I can call -continue working with Home Health PT      Follow Up Plan: SW will follow up with patient by phone over the next 2-3 weeks Reece Levy MSW, LCSW Licensed Clinical Social Worker [facility] [phone]

## 2021-05-25 NOTE — Patient Instructions (Signed)
Visit Information  PATIENT GOALS:  Goals Addressed               This Visit's Progress     Need help to get out of house and to Dr Lianne Bushy (pt-stated)        Timeframe:  Long-Range Goal Priority:  High Start Date:       01/25/2021                      Expected End Date:  06/18/2021                     Follow Up Date 06/10/2021    -continue with  virtual  counseling Expect EMS pickup for MD appointment on 05/28/21 (approved) - keep 90 percent of counseling appointments - schedule counseling appointment  -follow up with [facility] office re: staffing of hours, etc.  -follow up with Bariatric Coordinator for next steps  - begin a notebook of services in my neighborhood or community - call 211 when I need some help - follow-up on any referrals for help I am given - think ahead to make sure my need does not become an emergency - make a note about what I need to have by the phone or take with me, like an identification card or social security number have a back-up plan - have a back-up plan - make a list of family or friends that I can call -continue working with Home Health PT  Why is this important?   Knowing how and where to find help for yourself or family in your neighborhood and community is an important skill.  You will want to take some steps to learn how.    Notes:         The patient verbalized understanding of instructions, educational materials, and care plan provided today and declined offer to receive copy of patient instructions, educational materials, and care plan.   Telephone follow up appointment with care management team member scheduled for:06/11/21  Reece Levy MSW, LCSW Licensed Clinical Social Worker [facility] [place] [phone]

## 2021-05-26 DIAGNOSIS — Z6841 Body Mass Index (BMI) 40.0 and over, adult: Secondary | ICD-10-CM | POA: Diagnosis not present

## 2021-05-26 DIAGNOSIS — Z87891 Personal history of nicotine dependence: Secondary | ICD-10-CM | POA: Diagnosis not present

## 2021-05-26 DIAGNOSIS — K219 Gastro-esophageal reflux disease without esophagitis: Secondary | ICD-10-CM | POA: Diagnosis not present

## 2021-05-26 DIAGNOSIS — Z9181 History of falling: Secondary | ICD-10-CM | POA: Diagnosis not present

## 2021-05-26 DIAGNOSIS — M150 Primary generalized (osteo)arthritis: Secondary | ICD-10-CM | POA: Diagnosis not present

## 2021-05-26 DIAGNOSIS — D649 Anemia, unspecified: Secondary | ICD-10-CM | POA: Diagnosis not present

## 2021-05-26 DIAGNOSIS — G4733 Obstructive sleep apnea (adult) (pediatric): Secondary | ICD-10-CM | POA: Diagnosis not present

## 2021-05-26 DIAGNOSIS — M1611 Unilateral primary osteoarthritis, right hip: Secondary | ICD-10-CM | POA: Diagnosis not present

## 2021-05-26 DIAGNOSIS — I10 Essential (primary) hypertension: Secondary | ICD-10-CM | POA: Diagnosis not present

## 2021-05-27 ENCOUNTER — Telehealth: Payer: Self-pay | Admitting: Family Medicine

## 2021-05-27 DIAGNOSIS — G4733 Obstructive sleep apnea (adult) (pediatric): Secondary | ICD-10-CM | POA: Diagnosis not present

## 2021-05-27 DIAGNOSIS — M1611 Unilateral primary osteoarthritis, right hip: Secondary | ICD-10-CM | POA: Diagnosis not present

## 2021-05-27 NOTE — Telephone Encounter (Signed)
[employer] called in wanted to know if Dr. Para March would like to do a per to per to discuss adding additional home health visits.  Would need a call back by 12pm and the per per needs to be held by 5pm tomorrow

## 2021-05-27 NOTE — Telephone Encounter (Signed)
Please see what details you can get.  I am not available for this today.  I may not be able to do this tomorrow.  Thanks.

## 2021-05-28 DIAGNOSIS — Z7401 Bed confinement status: Secondary | ICD-10-CM | POA: Diagnosis not present

## 2021-05-28 DIAGNOSIS — Z6841 Body Mass Index (BMI) 40.0 and over, adult: Secondary | ICD-10-CM | POA: Diagnosis not present

## 2021-05-28 DIAGNOSIS — R0902 Hypoxemia: Secondary | ICD-10-CM | POA: Diagnosis not present

## 2021-05-28 DIAGNOSIS — E785 Hyperlipidemia, unspecified: Secondary | ICD-10-CM | POA: Diagnosis not present

## 2021-05-28 DIAGNOSIS — E669 Obesity, unspecified: Secondary | ICD-10-CM | POA: Diagnosis not present

## 2021-05-28 DIAGNOSIS — I10 Essential (primary) hypertension: Secondary | ICD-10-CM | POA: Diagnosis not present

## 2021-05-28 DIAGNOSIS — M1611 Unilateral primary osteoarthritis, right hip: Secondary | ICD-10-CM | POA: Diagnosis not present

## 2021-05-28 DIAGNOSIS — Z01818 Encounter for other preprocedural examination: Secondary | ICD-10-CM | POA: Diagnosis not present

## 2021-05-29 DIAGNOSIS — M1611 Unilateral primary osteoarthritis, right hip: Secondary | ICD-10-CM | POA: Diagnosis not present

## 2021-05-30 DIAGNOSIS — M1611 Unilateral primary osteoarthritis, right hip: Secondary | ICD-10-CM | POA: Diagnosis not present

## 2021-05-31 ENCOUNTER — Emergency Department (HOSPITAL_COMMUNITY): Payer: Medicare HMO

## 2021-05-31 ENCOUNTER — Other Ambulatory Visit: Payer: Self-pay

## 2021-05-31 ENCOUNTER — Inpatient Hospital Stay (HOSPITAL_COMMUNITY)
Admission: EM | Admit: 2021-05-31 | Discharge: 2021-06-28 | DRG: 981 | Disposition: A | Discharge status: Home Health | Payer: Medicare HMO | Attending: Internal Medicine | Admitting: Internal Medicine

## 2021-05-31 ENCOUNTER — Inpatient Hospital Stay (HOSPITAL_COMMUNITY): Payer: Medicare HMO

## 2021-05-31 ENCOUNTER — Telehealth: Payer: Self-pay

## 2021-05-31 DIAGNOSIS — Z66 Do not resuscitate: Secondary | ICD-10-CM | POA: Diagnosis not present

## 2021-05-31 DIAGNOSIS — Z20822 Contact with and (suspected) exposure to covid-19: Secondary | ICD-10-CM | POA: Diagnosis present

## 2021-05-31 DIAGNOSIS — L89892 Pressure ulcer of other site, stage 2: Secondary | ICD-10-CM | POA: Diagnosis present

## 2021-05-31 DIAGNOSIS — Z8249 Family history of ischemic heart disease and other diseases of the circulatory system: Secondary | ICD-10-CM | POA: Diagnosis not present

## 2021-05-31 DIAGNOSIS — E739 Lactose intolerance, unspecified: Secondary | ICD-10-CM | POA: Diagnosis present

## 2021-05-31 DIAGNOSIS — D649 Anemia, unspecified: Secondary | ICD-10-CM | POA: Diagnosis present

## 2021-05-31 DIAGNOSIS — E119 Type 2 diabetes mellitus without complications: Secondary | ICD-10-CM | POA: Diagnosis present

## 2021-05-31 DIAGNOSIS — R069 Unspecified abnormalities of breathing: Secondary | ICD-10-CM

## 2021-05-31 DIAGNOSIS — F40240 Claustrophobia: Secondary | ICD-10-CM | POA: Diagnosis present

## 2021-05-31 DIAGNOSIS — R609 Edema, unspecified: Secondary | ICD-10-CM | POA: Diagnosis not present

## 2021-05-31 DIAGNOSIS — Z452 Encounter for adjustment and management of vascular access device: Secondary | ICD-10-CM

## 2021-05-31 DIAGNOSIS — J9690 Respiratory failure, unspecified, unspecified whether with hypoxia or hypercapnia: Secondary | ICD-10-CM | POA: Diagnosis not present

## 2021-05-31 DIAGNOSIS — L8990 Pressure ulcer of unspecified site, unspecified stage: Secondary | ICD-10-CM | POA: Insufficient documentation

## 2021-05-31 DIAGNOSIS — K219 Gastro-esophageal reflux disease without esophagitis: Secondary | ICD-10-CM | POA: Diagnosis present

## 2021-05-31 DIAGNOSIS — R0902 Hypoxemia: Secondary | ICD-10-CM | POA: Diagnosis not present

## 2021-05-31 DIAGNOSIS — R269 Unspecified abnormalities of gait and mobility: Secondary | ICD-10-CM | POA: Diagnosis not present

## 2021-05-31 DIAGNOSIS — G4733 Obstructive sleep apnea (adult) (pediatric): Secondary | ICD-10-CM

## 2021-05-31 DIAGNOSIS — Z01818 Encounter for other preprocedural examination: Secondary | ICD-10-CM

## 2021-05-31 DIAGNOSIS — I509 Heart failure, unspecified: Secondary | ICD-10-CM | POA: Diagnosis not present

## 2021-05-31 DIAGNOSIS — R7401 Elevation of levels of liver transaminase levels: Secondary | ICD-10-CM

## 2021-05-31 DIAGNOSIS — Z91199 Patient's noncompliance with other medical treatment and regimen due to unspecified reason: Secondary | ICD-10-CM

## 2021-05-31 DIAGNOSIS — Z9911 Dependence on respirator [ventilator] status: Secondary | ICD-10-CM | POA: Diagnosis not present

## 2021-05-31 DIAGNOSIS — R531 Weakness: Secondary | ICD-10-CM | POA: Diagnosis not present

## 2021-05-31 DIAGNOSIS — Z7401 Bed confinement status: Secondary | ICD-10-CM | POA: Diagnosis not present

## 2021-05-31 DIAGNOSIS — J811 Chronic pulmonary edema: Secondary | ICD-10-CM | POA: Diagnosis not present

## 2021-05-31 DIAGNOSIS — I10 Essential (primary) hypertension: Secondary | ICD-10-CM | POA: Diagnosis not present

## 2021-05-31 DIAGNOSIS — L8942 Pressure ulcer of contiguous site of back, buttock and hip, stage 2: Secondary | ICD-10-CM | POA: Diagnosis not present

## 2021-05-31 DIAGNOSIS — I110 Hypertensive heart disease with heart failure: Secondary | ICD-10-CM | POA: Diagnosis not present

## 2021-05-31 DIAGNOSIS — J9691 Respiratory failure, unspecified with hypoxia: Secondary | ICD-10-CM | POA: Diagnosis present

## 2021-05-31 DIAGNOSIS — Z801 Family history of malignant neoplasm of trachea, bronchus and lung: Secondary | ICD-10-CM | POA: Diagnosis not present

## 2021-05-31 DIAGNOSIS — J90 Pleural effusion, not elsewhere classified: Secondary | ICD-10-CM | POA: Diagnosis not present

## 2021-05-31 DIAGNOSIS — Z6841 Body Mass Index (BMI) 40.0 and over, adult: Secondary | ICD-10-CM

## 2021-05-31 DIAGNOSIS — Z9289 Personal history of other medical treatment: Secondary | ICD-10-CM

## 2021-05-31 DIAGNOSIS — I5031 Acute diastolic (congestive) heart failure: Secondary | ICD-10-CM

## 2021-05-31 DIAGNOSIS — R32 Unspecified urinary incontinence: Secondary | ICD-10-CM | POA: Diagnosis not present

## 2021-05-31 DIAGNOSIS — Z87891 Personal history of nicotine dependence: Secondary | ICD-10-CM | POA: Diagnosis not present

## 2021-05-31 DIAGNOSIS — R197 Diarrhea, unspecified: Secondary | ICD-10-CM | POA: Diagnosis not present

## 2021-05-31 DIAGNOSIS — I5033 Acute on chronic diastolic (congestive) heart failure: Secondary | ICD-10-CM | POA: Diagnosis present

## 2021-05-31 DIAGNOSIS — R0602 Shortness of breath: Secondary | ICD-10-CM

## 2021-05-31 DIAGNOSIS — R7303 Prediabetes: Secondary | ICD-10-CM | POA: Diagnosis not present

## 2021-05-31 DIAGNOSIS — N179 Acute kidney failure, unspecified: Secondary | ICD-10-CM | POA: Diagnosis present

## 2021-05-31 DIAGNOSIS — J9621 Acute and chronic respiratory failure with hypoxia: Secondary | ICD-10-CM | POA: Diagnosis present

## 2021-05-31 DIAGNOSIS — R0600 Dyspnea, unspecified: Secondary | ICD-10-CM

## 2021-05-31 DIAGNOSIS — Z886 Allergy status to analgesic agent status: Secondary | ICD-10-CM

## 2021-05-31 DIAGNOSIS — R079 Chest pain, unspecified: Secondary | ICD-10-CM | POA: Diagnosis not present

## 2021-05-31 DIAGNOSIS — Z79899 Other long term (current) drug therapy: Secondary | ICD-10-CM

## 2021-05-31 DIAGNOSIS — J9601 Acute respiratory failure with hypoxia: Secondary | ICD-10-CM | POA: Diagnosis not present

## 2021-05-31 DIAGNOSIS — R0689 Other abnormalities of breathing: Secondary | ICD-10-CM | POA: Diagnosis not present

## 2021-05-31 DIAGNOSIS — Z888 Allergy status to other drugs, medicaments and biological substances status: Secondary | ICD-10-CM

## 2021-05-31 DIAGNOSIS — E1165 Type 2 diabetes mellitus with hyperglycemia: Secondary | ICD-10-CM | POA: Diagnosis not present

## 2021-05-31 DIAGNOSIS — J9610 Chronic respiratory failure, unspecified whether with hypoxia or hypercapnia: Secondary | ICD-10-CM | POA: Diagnosis not present

## 2021-05-31 DIAGNOSIS — J9622 Acute and chronic respiratory failure with hypercapnia: Secondary | ICD-10-CM | POA: Diagnosis present

## 2021-05-31 DIAGNOSIS — I517 Cardiomegaly: Secondary | ICD-10-CM | POA: Diagnosis not present

## 2021-05-31 DIAGNOSIS — Z9114 Patient's other noncompliance with medication regimen: Secondary | ICD-10-CM

## 2021-05-31 DIAGNOSIS — E873 Alkalosis: Secondary | ICD-10-CM | POA: Diagnosis not present

## 2021-05-31 DIAGNOSIS — M1611 Unilateral primary osteoarthritis, right hip: Secondary | ICD-10-CM | POA: Diagnosis present

## 2021-05-31 DIAGNOSIS — R0603 Acute respiratory distress: Secondary | ICD-10-CM

## 2021-05-31 DIAGNOSIS — Z4682 Encounter for fitting and adjustment of non-vascular catheter: Secondary | ICD-10-CM | POA: Diagnosis not present

## 2021-05-31 DIAGNOSIS — R0789 Other chest pain: Secondary | ICD-10-CM | POA: Diagnosis not present

## 2021-05-31 DIAGNOSIS — M47814 Spondylosis without myelopathy or radiculopathy, thoracic region: Secondary | ICD-10-CM | POA: Diagnosis not present

## 2021-05-31 DIAGNOSIS — Z4659 Encounter for fitting and adjustment of other gastrointestinal appliance and device: Secondary | ICD-10-CM

## 2021-05-31 DIAGNOSIS — J9602 Acute respiratory failure with hypercapnia: Secondary | ICD-10-CM | POA: Diagnosis not present

## 2021-05-31 DIAGNOSIS — L89202 Pressure ulcer of unspecified hip, stage 2: Secondary | ICD-10-CM | POA: Clinically undetermined

## 2021-05-31 DIAGNOSIS — Z9981 Dependence on supplemental oxygen: Secondary | ICD-10-CM

## 2021-05-31 DIAGNOSIS — E662 Morbid (severe) obesity with alveolar hypoventilation: Secondary | ICD-10-CM | POA: Diagnosis present

## 2021-05-31 DIAGNOSIS — R918 Other nonspecific abnormal finding of lung field: Secondary | ICD-10-CM | POA: Diagnosis not present

## 2021-05-31 DIAGNOSIS — M1631 Unilateral osteoarthritis resulting from hip dysplasia, right hip: Secondary | ICD-10-CM | POA: Diagnosis not present

## 2021-05-31 DIAGNOSIS — M1990 Unspecified osteoarthritis, unspecified site: Secondary | ICD-10-CM

## 2021-05-31 LAB — CBC WITH DIFFERENTIAL/PLATELET
Abs Immature Granulocytes: 0.12 10*3/uL — ABNORMAL HIGH (ref 0.00–0.07)
Basophils Absolute: 0.1 10*3/uL (ref 0.0–0.1)
Basophils Relative: 1 %
Eosinophils Absolute: 0.2 10*3/uL (ref 0.0–0.5)
Eosinophils Relative: 2 %
HCT: 51.3 % (ref 39.0–52.0)
Hemoglobin: 14.5 g/dL (ref 13.0–17.0)
Immature Granulocytes: 1 %
Lymphocytes Relative: 12 %
Lymphs Abs: 1.2 10*3/uL (ref 0.7–4.0)
MCH: 28.1 pg (ref 26.0–34.0)
MCHC: 28.3 g/dL — ABNORMAL LOW (ref 30.0–36.0)
MCV: 99.4 fL (ref 80.0–100.0)
Monocytes Absolute: 0.7 10*3/uL (ref 0.1–1.0)
Monocytes Relative: 7 %
Neutro Abs: 7.7 10*3/uL (ref 1.7–7.7)
Neutrophils Relative %: 77 %
Platelets: 264 10*3/uL (ref 150–400)
RBC: 5.16 MIL/uL (ref 4.22–5.81)
RDW: 17 % — ABNORMAL HIGH (ref 11.5–15.5)
WBC: 10 10*3/uL (ref 4.0–10.5)
nRBC: 0.2 % (ref 0.0–0.2)

## 2021-05-31 LAB — I-STAT VENOUS BLOOD GAS, ED
Acid-Base Excess: 9 mmol/L — ABNORMAL HIGH (ref 0.0–2.0)
Bicarbonate: 41.8 mmol/L — ABNORMAL HIGH (ref 20.0–28.0)
Calcium, Ion: 1.12 mmol/L — ABNORMAL LOW (ref 1.15–1.40)
HCT: 52 % (ref 39.0–52.0)
Hemoglobin: 17.7 g/dL — ABNORMAL HIGH (ref 13.0–17.0)
O2 Saturation: 91 %
Potassium: 4.8 mmol/L (ref 3.5–5.1)
Sodium: 140 mmol/L (ref 135–145)
TCO2: 45 mmol/L — ABNORMAL HIGH (ref 22–32)
pCO2, Ven: 98.1 mmHg (ref 44.0–60.0)
pH, Ven: 7.237 — ABNORMAL LOW (ref 7.250–7.430)
pO2, Ven: 78 mmHg — ABNORMAL HIGH (ref 32.0–45.0)

## 2021-05-31 LAB — TROPONIN I (HIGH SENSITIVITY)
Troponin I (High Sensitivity): 19 ng/L — ABNORMAL HIGH (ref <18)
Troponin I (High Sensitivity): 18 ng/L — ABNORMAL HIGH (ref <18)

## 2021-05-31 LAB — RESP PANEL BY RT-PCR (FLU A&B, COVID) ARPGX2
Influenza A by PCR: NEGATIVE
Influenza B by PCR: NEGATIVE
SARS Coronavirus 2 by RT PCR: NEGATIVE

## 2021-05-31 LAB — BASIC METABOLIC PANEL
Anion gap: 8 (ref 5–15)
BUN: 6 mg/dL (ref 6–20)
CO2: 34 mmol/L — ABNORMAL HIGH (ref 22–32)
Calcium: 8.4 mg/dL — ABNORMAL LOW (ref 8.9–10.3)
Chloride: 96 mmol/L — ABNORMAL LOW (ref 98–111)
Creatinine, Ser: 0.69 mg/dL (ref 0.61–1.24)
GFR, Estimated: >60 mL/min (ref >60)
Glucose, Bld: 116 mg/dL — ABNORMAL HIGH (ref 70–99)
Potassium: 4.6 mmol/L (ref 3.5–5.1)
Sodium: 138 mmol/L (ref 135–145)

## 2021-05-31 LAB — BRAIN NATRIURETIC PEPTIDE: B Natriuretic Peptide: 231.8 pg/mL — ABNORMAL HIGH (ref 0.0–100.0)

## 2021-05-31 MED ORDER — TIZANIDINE HCL 4 MG PO TABS
4.0000 mg | ORAL_TABLET | Freq: Three times a day (TID) | ORAL | Status: DC | PRN
  Administered 2021-06-18: 4 mg via ORAL
  Filled 2021-05-31 (×2): qty 1

## 2021-05-31 MED ORDER — LISINOPRIL 10 MG PO TABS
10.0000 mg | ORAL_TABLET | Freq: Every day | ORAL | Status: DC
  Administered 2021-05-31: 10 mg via ORAL
  Filled 2021-05-31: qty 1

## 2021-05-31 MED ORDER — CARVEDILOL 3.125 MG PO TABS: 3.1250 mg | ORAL_TABLET | Freq: Two times a day (BID) | ORAL | Status: DC

## 2021-05-31 MED ORDER — ACETAZOLAMIDE 250 MG PO TABS
250.0000 mg | ORAL_TABLET | Freq: Every day | ORAL | Status: DC
  Administered 2021-05-31: 250 mg via ORAL
  Filled 2021-05-31 (×2): qty 1

## 2021-05-31 MED ORDER — HYDRALAZINE HCL 25 MG PO TABS: 25.0000 mg | ORAL_TABLET | Freq: Four times a day (QID) | ORAL | Status: DC | PRN

## 2021-05-31 MED ORDER — FUROSEMIDE 10 MG/ML IJ SOLN: 40.0000 mg | Freq: Two times a day (BID) | INTRAMUSCULAR | Status: DC

## 2021-05-31 MED ORDER — ONDANSETRON HCL 4 MG/2ML IJ SOLN: 4.0000 mg | Freq: Four times a day (QID) | INTRAMUSCULAR | Status: DC | PRN

## 2021-05-31 MED ORDER — OXYCODONE HCL 5 MG PO TABS
5.0000 mg | ORAL_TABLET | Freq: Four times a day (QID) | ORAL | Status: DC | PRN
Start: 2021-05-31 — End: 2021-06-10
  Administered 2021-05-31 – 2021-06-10 (×2): 5 mg via ORAL
  Filled 2021-05-31 (×2): qty 1

## 2021-05-31 MED ORDER — FUROSEMIDE 10 MG/ML IJ SOLN
60.0000 mg | Freq: Two times a day (BID) | INTRAMUSCULAR | Status: DC
  Administered 2021-05-31 – 2021-06-05 (×10): 60 mg via INTRAVENOUS
  Filled 2021-05-31 (×10): qty 6

## 2021-05-31 MED ORDER — ENOXAPARIN SODIUM 40 MG/0.4ML IJ SOSY
40.0000 mg | PREFILLED_SYRINGE | INTRAMUSCULAR | Status: DC
Start: 2021-05-31 — End: 2021-06-02
  Administered 2021-05-31 – 2021-06-01 (×2): 40 mg via SUBCUTANEOUS
  Filled 2021-05-31 (×2): qty 0.4

## 2021-05-31 MED ORDER — GUAIFENESIN ER 600 MG PO TB12
1200.0000 mg | ORAL_TABLET | Freq: Two times a day (BID) | ORAL | Status: DC
  Administered 2021-05-31: 1200 mg via ORAL
  Filled 2021-05-31: qty 2

## 2021-05-31 MED ORDER — IPRATROPIUM-ALBUTEROL 0.5-2.5 (3) MG/3ML IN SOLN
3.0000 mL | Freq: Four times a day (QID) | RESPIRATORY_TRACT | Status: DC
  Administered 2021-05-31 – 2021-06-10 (×36): 3 mL via RESPIRATORY_TRACT
  Filled 2021-05-31 (×36): qty 3

## 2021-05-31 MED ORDER — OXYCODONE-ACETAMINOPHEN 5-325 MG PO TABS
1.0000 | ORAL_TABLET | Freq: Four times a day (QID) | ORAL | Status: DC | PRN
  Administered 2021-05-31: 1 via ORAL
  Filled 2021-05-31: qty 1

## 2021-05-31 MED ORDER — SODIUM CHLORIDE 0.9 % IV SOLN: 250.0000 mL | INTRAVENOUS | Status: DC | PRN

## 2021-05-31 MED ORDER — SODIUM CHLORIDE 0.9% FLUSH
3.0000 mL | Freq: Two times a day (BID) | INTRAVENOUS | Status: DC
  Administered 2021-05-31 – 2021-06-06 (×11): 3 mL via INTRAVENOUS

## 2021-05-31 MED ORDER — SODIUM CHLORIDE 0.9% FLUSH
3.0000 mL | INTRAVENOUS | Status: DC | PRN
  Administered 2021-05-31: 3 mL via INTRAVENOUS

## 2021-05-31 MED ORDER — OXYCODONE-ACETAMINOPHEN 10-325 MG PO TABS: 1.0000 | ORAL_TABLET | Freq: Four times a day (QID) | ORAL | Status: DC | PRN

## 2021-05-31 MED ORDER — ACETAMINOPHEN 325 MG PO TABS: 650.0000 mg | ORAL_TABLET | ORAL | Status: DC | PRN

## 2021-05-31 MED ORDER — ALLOPURINOL 300 MG PO TABS
300.0000 mg | ORAL_TABLET | Freq: Two times a day (BID) | ORAL | Status: DC
  Administered 2021-05-31: 300 mg via ORAL
  Filled 2021-05-31: qty 3

## 2021-05-31 NOTE — Telephone Encounter (Signed)
[facility] Night - Client TELEPHONE ADVICE RECORD [employer] Patient Name: Michael Payne Gender: Male DOB: April 25, 1971 Age: 50 Y 8 M 10 D Return Phone Number: [phone] (Primary) Address: City/ State/ Zip: [place] [address] Client [facility] Night - Client Client Site [facility] - Night Physician Raechel Ache - MD Contact Type Call Who Payne Calling Patient / Member / Family / Caregiver Call Type Triage / Clinical Relationship To Patient Self Return Phone Number [phone] (Primary) Chief Complaint BREATHING - shortness of breath or sounds breathless Reason for Call Symptomatic / Request for Health Information Initial Comment Caller states he has been using a breath device, but he still having issues with his breathing. Translation No Nurse Assessment Nurse: Alexander Mt, RN, Nicholaus Bloom Date/Time (Eastern Time): 05/31/2021 7:31:45 AM Confirm and document reason for call. If symptomatic, describe symptoms. ---Caller states he has been using an incentive spirometer, but he still having issues with his breathing. Was seen in doctors office on friday for weight loss surgery and O2 sat was 87%. No home O2. Current weight 490lbs. He Payne unable to walk. No hx of lung disease. No fever. Does the patient have any new or worsening symptoms? ---Yes Will a triage be completed? ---Yes Related visit to physician within the last 2 weeks? ---No Does the PT have any chronic conditions? (i.e. diabetes, asthma, this includes High risk factors for pregnancy, etc.) ---Yes List chronic conditions. ---Morbid obesity, HTN, diabetes Payne this a behavioral health or substance abuse call? ---No Guidelines Guideline Title Affirmed Question Affirmed Notes Nurse Date/Time (Eastern Time) Breathing Difficulty Illness requiring prolonged bedrest in past month (e.g., immobilization, long hospital stay) Alexander Mt, RNNicholaus Bloom  05/31/2021 7:36:27 AM PLEASE NOTE: All timestamps contained within this report are represented as [place] Standard Time. CONFIDENTIALTY NOTICE: This fax transmission Payne intended only for the addressee. It contains information that Payne legally privileged, confidential or otherwise protected from use or disclosure. If you are not the intended recipient, you are strictly prohibited from reviewing, disclosing, copying using or disseminating any of this information or taking any action in reliance on or regarding this information. If you have received this fax in error, please notify us immediately by telephone so that we can arrange for its return to [place]. Phone: [phone], Toll-Free: [phone], Fax: [phone] Page: 2 of 2 Call Id: [id number] Disp. Time Lamount Cohen Time) Disposition Final User 05/31/2021 7:29:37 AM Send to Urgent Mammie Russian 05/31/2021 7:40:17 AM Go to ED Now Yes Alexander Mt, RN, Prentiss Bells Disagree/Comply Disagree Caller Understands Yes PreDisposition Home Care Care Advice Given Per Guideline GO TO ED NOW: * You need to be seen in the Emergency Department. * Go to the ED at ___________ Hospital. * Leave now. Drive carefully. NOTE TO TRIAGER - DRIVING: * Another adult should drive. * Patient should not delay going to the emergency department. CARE ADVICE given per Breathing Difficulty (Adult) guideline. CALL EMS 911 IF: * Call EMS if you become worse. Referrals [facility] - ED

## 2021-05-31 NOTE — ED Notes (Signed)
Pt in radiology 

## 2021-05-31 NOTE — Telephone Encounter (Signed)
Agree with ER eval.  Thanks.  

## 2021-05-31 NOTE — ED Triage Notes (Signed)
Pt BIB EMS from home c/o SOB x 1 week, low 80s on RA, doesn't normally wear oxygen. Pt was placed on NRB= 98% then switched to 6LNC= 95%. Pt states he feels so much better after O2 administration. Denies chest pain, no fever. H/O HTN, Obesity, borderline diabetic.

## 2021-05-31 NOTE — Telephone Encounter (Signed)
I spoke with pt; pt said he is having phlegm in throat that at times he cannot cough up and that causes difficulty breathing. When pt can cough up phlegm it appears clear in color. Pt said at this time pt is not having CP; pt said he is not gasping for breath but he is having some difficulty in breathing. Pt started with diarrhea last wk that is sometimes watery to loose; pt does not have a way to ck oxygen level but on Fri at wt loss appt O2 level was 87%. Pt does not have oxygen at home. Pt said after he is dressed he will go to [facility] ED for eval. Sending note to DR Para March and Shanda Bumps CMA. Will teams [facility] CMA.

## 2021-05-31 NOTE — ED Provider Notes (Signed)
[facility] EMERGENCY DEPARTMENT Provider Note   CSN: [id number] Arrival date & time: 05/31/21  1504     History Chief Complaint  Patient presents with   Shortness of Breath    Michael Payne is a 50 y.o. male with pmh of HTN, prediabetes and morbid obesity BIB EMS for SOB.  Patient reports that over the past 2 weeks he has been feeling increasingly short of breath.  This morning he called his doctor's office and they told him to present to the emergency department.  Denies any chest pain, palpitations, dizziness or syncope.  Patient states that he is bedbound and is cared for by his family and home health aides.  Denies history of heart failure, asthma or COPD.  Patient does not smoke.  Patient has never had a blood clot.  Endorses a cough occasionally productive of clear sputum.  No hemoptysis.  Has not noted any increased swelling of his lower extremities.  He does physical therapy 3 times a week and over the past week the aids have told him that they believe he needs to come to the hospital due to his exertion.  Denying NVD.  Reports he has been having "trouble with bowel movements." Patient also reports that he began a keto diet 2 weeks ago.  No recent illness or sick contacts.   Shortness of Breath Associated symptoms: cough and wheezing   Associated symptoms: no chest pain, no fever, no headaches and no vomiting       Past Medical History:  Diagnosis Date   Anemia    borderline anemia, was instructed to eat more iron filled foods   GERD (gastroesophageal reflux disease)    lactose intolerance   Hip pain    HTN (hypertension)    Obesity    Sleep apnea     Patient Active Problem List   Diagnosis Date Noted   Gout, unspecified 03/10/2021   Edema 03/10/2021   Diabetes mellitus without complication (HCC) 03/10/2021   Muscle spasms of both lower extremities 07/29/2020   Bedbound 05/22/2020   Unilateral osteoarthritis of hip, right 12/27/2019   Severe  obstructive sleep apnea 02/22/2018   Hypertriglyceridemia 08/24/2017   Low HDL (under 40) 08/24/2017   Metabolic syndrome 08/24/2017   Morbid obesity (HCC) 10/14/2013   HTN (hypertension) 10/14/2013    Past Surgical History:  Procedure Laterality Date   FOOT SURGERY     KNEE ARTHROSCOPY WITH MEDIAL MENISECTOMY Left 12/16/2014   Procedure: KNEE ARTHROSCOPY WITH MEDIAL MENISECTOMY;  Surgeon: Sheral Apley, MD;  Location: [facility] OR;  Service: Orthopedics;  Laterality: Left;       Family History  Problem Relation Age of Onset   Heart attack Mother 67   Lung cancer Father    Cancer Father    CAD Maternal Grandfather     Social History   Tobacco Use   Smoking status: Former    Packs/day: 2.00    Years: 22.00    Pack years: 44.00    Types: Cigarettes    Quit date: 02/24/2011    Years since quitting: 10.2   Smokeless tobacco: Never  Vaping Use   Vaping Use: Never used  Substance Use Topics   Alcohol use: Not Currently    Comment: Weekends/beer-- has slowed down  alot   Drug use: Not Currently    Types: Marijuana    Comment: former -  quit in 2012    Home Medications Prior to Admission medications   Medication Sig Start  Date End Date Taking? Authorizing Provider  allopurinol (ZYLOPRIM) 300 MG tablet TAKE 1 TABLET TWICE DAILY 06/25/20   Emi Belfast, FNP  amLODipine (NORVASC) 10 MG tablet Take 1 tablet (10 mg total) by mouth daily. 10/21/20   Joaquim Nam, MD  colchicine (COLCRYS) 0.6 MG tablet Take 1 tablet (0.6 mg total) by mouth daily as needed. 03/09/21   Joaquim Nam, MD  diclofenac (VOLTAREN) 75 MG EC tablet TAKE 1 TABLET BY MOUTH 2 TIMES DAILY AS NEEDED. TAKE WITH FOOD. 05/05/21   Joaquim Nam, MD  Dulaglutide (TRULICITY) 0.75 MG/0.5ML SOPN Inject 0.75 mg into the skin once a week. 03/16/21   Joaquim Nam, MD  lidocaine (LIDODERM) 5 % PLACE 1 PATCH ONTO THE SKIN DAILY. REMOVE & DISCARD [facility] WITHIN 12 HOURS 05/19/21   Joaquim Nam, MD  naloxone  [facility]) nasal spray 4 mg/0.1 mL Spray nostril if needed for opioid overuse- decreased consciousness, decreased respirations 09/02/20   Emi Belfast, FNP  oxyCODONE-acetaminophen (PERCOCET) 10-325 MG tablet Take 1 tablet by mouth every 6 (six) hours as needed for pain. 05/07/21   Joaquim Nam, MD  tiZANidine (ZANAFLEX) 4 MG tablet Take 1 tablet (4 mg total) by mouth every 8 (eight) hours as needed for muscle spasms. 12/13/20   Joaquim Nam, MD  vitamin C (ASCORBIC ACID) 500 MG tablet Take 500 mg by mouth daily.    [provider]    Allergies    Aspirin and Lactose intolerance (gi)  Review of Systems   Review of Systems  Constitutional:  Negative for chills and fever.  HENT:  Negative for congestion.   Respiratory:  Positive for cough, shortness of breath and wheezing. Negative for chest tightness.   Cardiovascular:  Negative for chest pain and palpitations.  Gastrointestinal:  Positive for constipation. Negative for diarrhea, nausea and vomiting.  Skin:  Negative for pallor.  Neurological:  Positive for weakness. Negative for dizziness, syncope, light-headedness and headaches.   Physical Exam Updated Vital Signs BP (!) 152/90 (BP Location: Right Arm)   Pulse 92   Temp 98.8 F (37.1 C) (Oral)   Resp (!) 21   Ht 6\' 4"  (1.93 m)   Wt (!) 222.3 kg   SpO2 99%   BMI 59.64 kg/m   Physical Exam Vitals and nursing note reviewed.  Constitutional:      General: He is not in acute distress.    Appearance: Normal appearance. He is obese.  HENT:     Head: Normocephalic and atraumatic.  Eyes:     General: No scleral icterus.    Conjunctiva/sclera: Conjunctivae normal.  Cardiovascular:     Rate and Rhythm: Normal rate and regular rhythm.  Pulmonary:     Effort: Pulmonary effort is normal. No respiratory distress.     Comments: Difficult to assess lung sounds due to patient immobility.  Some expiratory wheezing heard without stethoscope. Musculoskeletal:     Right  lower leg: No tenderness. Edema (1+) present.     Left lower leg: No tenderness.     Comments: Patient able to lift himself up in the bed with his arms however unable to roll from side to side.  Skin:    General: Skin is warm and dry.     Findings: No rash.  Neurological:     Mental Status: He is alert.  Psychiatric:        Mood and Affect: Mood normal.        Behavior: Behavior  normal.    ED Results / Procedures / Treatments   Labs (all labs ordered are listed, but only abnormal results are displayed) Labs Reviewed  BASIC METABOLIC PANEL  CBC WITH DIFFERENTIAL/PLATELET  BRAIN NATRIURETIC PEPTIDE  TROPONIN I (HIGH SENSITIVITY)    EKG EKG Interpretation  Date/Time:  Monday May 31 2021 15:14:24 EDT Ventricular Rate:  94 PR Interval:  177 QRS Duration: 92 QT Interval:  359 QTC Calculation: 449 R Axis:   150 Text Interpretation: Sinus rhythm Left posterior fascicular block Low voltage, precordial leads Consider anterior infarct No significant change since last tracing Confirmed by Benjiman Core [phone]) on 05/31/2021 3:19:05 PM  Radiology DG Chest Port 1 View  Result Date: 05/31/2021 CLINICAL DATA:  Shortness of breath. EXAM: PORTABLE CHEST 1 VIEW.  Patient is rotated. COMPARISON:  Chest x-ray 07/24/2015 FINDINGS: Marked patient rotation with partial opacification of the right lung which may be due to rotation. Enlarged cardiac silhouette. Question main pulmonary artery enlargement which may be due to rotation. Poorly visualized right heart border which may be due to rotation. Question left pleural effusion. IMPRESSION: 1. Marked patient rotation with limited evaluation. Unable to evaluate the right lung with possible positive findings. Recommend repeat PA and lateral view of the chest. 2. Cardiomegaly with question of left pleural effusion. These results will be called to the ordering clinician or representative by the Radiologist Assistant, and communication documented in  the PACS or [employer]. Electronically Signed   By: Tish Frederickson M.D.   On: 05/31/2021 16:36    Procedures Procedures   Medications Ordered in ED  ED Course  I have reviewed the triage vital signs and the nursing notes. Discussed with MD Pickering.  Pertinent labs & imaging results that were available during my care of the patient were reviewed by me and considered in my medical decision making (see chart for details).    MDM Rules/Calculators/A&P Michael Payne is a 50 y.o. male with pmh of HTN, prediabetes and morbid obesity BIB EMS for SOB.  Patient reports that over the past 2 weeks he has been feeling increasingly short of breath.  This morning he called his doctor's office and they told him to present to the emergency department.  Denies any chest pain, palpitations, dizziness or syncope.  Patient states that he is bedbound and is cared for by his family and home health aides.  Denies history of heart failure, asthma or COPD.  Patient does not smoke.  Patient has never had a blood clot.  Endorses a cough occasionally productive of clear sputum.  No hemoptysis.  Has not noted any increased swelling of his lower extremities.    CBC was unremarkable and BMP similar to 2 months ago however patient mildly hypocalcemic to 8.4.  First troponin 19, second ordered. BNP 231.8. Covid pending.  Unable to apply PERC criteria to the patient due to him being bedbound. Wells PE score in low-likelihood of PE category. Heart score 4.  Initial chest x-ray revealed cardiomegaly and a questionable left pleural effusion.  Radiology contacted me suggesting a repeat chest x-ray to include a lateral view as the current CXR was limited due to patient's body habitus.  I discussed with radiology personally that patient is bedbound with limited movement but we will try the repeat scan.  This repeat scan was unable to be done due to patient's body habitus.  Patient continues to be hypoxic on room air.  Currently  satting 97% on 5 L of oxygen.  Hospitalist  agrees to admit this patient due to his difficulty breathing and need for further work-up.  I discussed this with the patient and he is agreeable to staying in the hospital.  Final Clinical Impression(s) / ED Diagnoses Final diagnoses:  SOB (shortness of breath)    Rx / DC Orders Admit to the hospital for further work-up and management of the patient's hypoxia and questionable CXR.    Woodroe Chen 05/31/21 1839    Benjiman Core, MD 05/31/21 [phone]

## 2021-05-31 NOTE — Progress Notes (Signed)
Notified by RN that pt is being diuresed for CHF and not able to use external urinary collection system and pt cannot use urinal.  Foley placed overnight to monitor I&O with diuresis

## 2021-05-31 NOTE — H&P (Addendum)
History and Physical    [name]:[id number] DOB: 07/26/1971 DOA: 05/31/2021  PCP: Joaquim Nam, MD (Confirm with patient/family/NH records and if not entered, this has to be entered at [facility] point of entry) Patient coming from: Home  I have personally briefly reviewed patient's old medical records in [facility]  Chief Complaint: Cough and SOB  HPI: Michael Payne is a 50 y.o. male with medical history significant of morbid obesity, OSA noncompliant with CPAP, HTN, chronic leg pain on narcotics, chronic ambulation dysfunction bedbound for 2 years, presented with increasing shortness of breath.  Symptoms started " few weeks ago", initially with cough and become productive with clear phlegm, cannot lie flat, the last 2 days developed increasing shortness of breath, denies any chest pain, no fever or chills.  Still coughing up clear phlegm, no wheezing.  Is a non-smoker.  There is also noncompliant issue, patient claims difficult to take today's blood pressure meds, and unsure about yesterday's.  ED Course: Patient was found to have hypoxia stabilized on 6 L via nasal cannula, blood pressure significant elevated.  Chest x-ray today showing bilateral pulmonary congestion, but overall poor quality.  Admitted for hypoxia.  Review of Systems: As per HPI otherwise 14 point review of systems negative.    Past Medical History:  Diagnosis Date   Anemia    borderline anemia, was instructed to eat more iron filled foods   GERD (gastroesophageal reflux disease)    lactose intolerance   Hip pain    HTN (hypertension)    Obesity    Sleep apnea     Past Surgical History:  Procedure Laterality Date   FOOT SURGERY     KNEE ARTHROSCOPY WITH MEDIAL MENISECTOMY Left 12/16/2014   Procedure: KNEE ARTHROSCOPY WITH MEDIAL MENISECTOMY;  Surgeon: Sheral Apley, MD;  Location: [facility] OR;  Service: Orthopedics;  Laterality: Left;     reports that he quit smoking about 10 years ago. His smoking  use included cigarettes. He has a 44.00 pack-year smoking history. He has never used smokeless tobacco. He reports that he does not currently use alcohol. He reports that he does not currently use drugs after having used the following drugs: Marijuana.  Allergies  Allergen Reactions   Aspirin Other (See Comments)    Upset stomach   Lactose Intolerance (Gi) Nausea And Vomiting    Family History  Problem Relation Age of Onset   Heart attack Mother 70   Lung cancer Father    Cancer Father    CAD Maternal Grandfather      Prior to Admission medications   Medication Sig Start Date End Date Taking? Authorizing Provider  allopurinol (ZYLOPRIM) 300 MG tablet TAKE 1 TABLET TWICE DAILY Patient taking differently: Take 300 mg by mouth 2 (two) times daily. 06/25/20  Yes Emi Belfast, FNP  amLODipine (NORVASC) 10 MG tablet Take 1 tablet (10 mg total) by mouth daily. 10/21/20  Yes Joaquim Nam, MD  colchicine (COLCRYS) 0.6 MG tablet Take 1 tablet (0.6 mg total) by mouth daily as needed. 03/09/21  Yes Joaquim Nam, MD  diclofenac (VOLTAREN) 75 MG EC tablet TAKE 1 TABLET BY MOUTH 2 TIMES DAILY AS NEEDED. TAKE WITH FOOD. Patient taking differently: Take 75 mg by mouth 2 (two) times daily with a meal. 05/05/21  Yes Joaquim Nam, MD  Dulaglutide (TRULICITY) 0.75 MG/0.5ML SOPN Inject 0.75 mg into the skin once a week. 03/16/21  Yes Joaquim Nam, MD  naloxone [facility])  nasal spray 4 mg/0.1 mL Spray nostril if needed for opioid overuse- decreased consciousness, decreased respirations 09/02/20  Yes Emi Belfast, FNP  oxyCODONE-acetaminophen (PERCOCET) 10-325 MG tablet Take 1 tablet by mouth every 6 (six) hours as needed for pain. 05/07/21  Yes Joaquim Nam, MD  tiZANidine (ZANAFLEX) 4 MG tablet Take 1 tablet (4 mg total) by mouth every 8 (eight) hours as needed for muscle spasms. 12/13/20  Yes Joaquim Nam, MD  lidocaine (LIDODERM) 5 % PLACE 1 PATCH ONTO THE SKIN DAILY. REMOVE &  DISCARD [facility] WITHIN 12 HOURS Patient not taking: No sig reported 05/19/21   Joaquim Nam, MD    Physical Exam: Vitals:   05/31/21 1845 05/31/21 1905 05/31/21 1910 05/31/21 2000  BP: (!) 164/101   (!) 169/157  Pulse: 94 95 93 (!) 104  Resp: 18 19 20 18   Temp:      TempSrc:      SpO2: (!) 86% 99% 100% 93%  Weight:      Height:        Constitutional: NAD, calm, comfortable Vitals:   05/31/21 1845 05/31/21 1905 05/31/21 1910 05/31/21 2000  BP: (!) 164/101   (!) 169/157  Pulse: 94 95 93 (!) 104  Resp: 18 19 20 18   Temp:      TempSrc:      SpO2: (!) 86% 99% 100% 93%  Weight:      Height:       Eyes: PERRL, lids and conjunctivae normal ENMT: Mucous membranes are moist. Posterior pharynx clear of any exudate or lesions.Normal dentition.  Neck: normal, supple, no masses, no thyromegaly Respiratory: clear to auscultation bilaterally, scattered wheezing, fine crackles on bilateral bases.  Increasing respiratory effort, talking in broken sentences.  No accessory muscle use.  Cardiovascular: Regular rate and rhythm, no murmurs / rubs / gallops.  1+ extremity edema. 2+ pedal pulses. No carotid bruits.  Abdomen: no tenderness, no masses palpated. No hepatosplenomegaly. Bowel sounds positive.  Musculoskeletal: no clubbing / cyanosis. No joint deformity upper and lower extremities. Good ROM, no contractures. Normal muscle tone.  Skin: no rashes, lesions, ulcers. No induration Neurologic: CN 2-12 grossly intact. Sensation intact, DTR normal. Strength 5/5 in all 4.  Psychiatric: Normal judgment and insight. Alert and oriented x 3. Normal mood.  Lethargic    Labs on Admission: I have personally reviewed following labs and imaging studies  CBC: Recent Labs  Lab 05/31/21 1544 05/31/21 1916  WBC 10.0  --   NEUTROABS 7.7  --   HGB 14.5 17.7*  HCT 51.3 52.0  MCV 99.4  --   PLT 264  --    Basic Metabolic Panel: Recent Labs  Lab 05/31/21 1544 05/31/21 1916  NA 138 140  K 4.6  4.8  CL 96*  --   CO2 34*  --   GLUCOSE 116*  --   BUN 6  --   CREATININE 0.69  --   CALCIUM 8.4*  --    GFR: Estimated Creatinine Clearance: 222.8 mL/min (by C-G formula based on SCr of 0.69 mg/dL). Liver Function Tests: No results for input(s): AST, ALT, ALKPHOS, BILITOT, PROT, ALBUMIN in the last 168 hours. No results for input(s): LIPASE, AMYLASE in the last 168 hours. No results for input(s): AMMONIA in the last 168 hours. Coagulation Profile: No results for input(s): INR, PROTIME in the last 168 hours. Cardiac Enzymes: No results for input(s): CKTOTAL, CKMB, CKMBINDEX, TROPONINI in the last 168 hours. BNP (last 3 results) Recent  Labs    03/09/21 1232  PROBNP 23.0   HbA1C: No results for input(s): HGBA1C in the last 72 hours. CBG: No results for input(s): GLUCAP in the last 168 hours. Lipid Profile: No results for input(s): CHOL, HDL, LDLCALC, TRIG, CHOLHDL, LDLDIRECT in the last 72 hours. Thyroid Function Tests: No results for input(s): TSH, T4TOTAL, FREET4, T3FREE, THYROIDAB in the last 72 hours. Anemia Panel: No results for input(s): VITAMINB12, FOLATE, FERRITIN, TIBC, IRON, RETICCTPCT in the last 72 hours. Urine analysis:    Component Value Date/Time   COLORURINE YELLOW 12/27/2019 2026   APPEARANCEUR CLEAR 12/27/2019 2026   LABSPEC 1.025 12/27/2019 2026   PHURINE 5.0 12/27/2019 2026   GLUCOSEU NEGATIVE 12/27/2019 2026   HGBUR NEGATIVE 12/27/2019 2026   BILIRUBINUR NEGATIVE 12/27/2019 2026   BILIRUBINUR Neg 12/18/2019 1728   KETONESUR NEGATIVE 12/27/2019 2026   PROTEINUR NEGATIVE 12/27/2019 2026   UROBILINOGEN 0.2 12/18/2019 1728   UROBILINOGEN 0.2 05/04/2010 1412   NITRITE NEGATIVE 12/27/2019 2026   LEUKOCYTESUR TRACE (A) 12/27/2019 2026    Radiological Exams on Admission: DG Chest Port 1 View  Result Date: 05/31/2021 CLINICAL DATA:  Shortness of breath. EXAM: PORTABLE CHEST 1 VIEW.  Patient is rotated. COMPARISON:  Chest x-ray 07/24/2015 FINDINGS:  Marked patient rotation with partial opacification of the right lung which may be due to rotation. Enlarged cardiac silhouette. Question main pulmonary artery enlargement which may be due to rotation. Poorly visualized right heart border which may be due to rotation. Question left pleural effusion. IMPRESSION: 1. Marked patient rotation with limited evaluation. Unable to evaluate the right lung with possible positive findings. Recommend repeat PA and lateral view of the chest. 2. Cardiomegaly with question of left pleural effusion. These results will be called to the ordering clinician or representative by the Radiologist Assistant, and communication documented in the PACS or [employer]. Electronically Signed   By: Tish Frederickson M.D.   On: 05/31/2021 16:36    EKG: Independently reviewed.  Sinus, low voltage  Assessment/Plan Active Problems:   Acute diastolic CHF (congestive heart failure) (HCC)   CHF (congestive heart failure) (HCC)  (please populate well all problems here in Problem List. (For example, if patient is on BP meds at home and you resume or decide to hold them, it is a problem that needs to be her. Same for CAD, COPD, HLD and so on)  Acute (probably on chronic) diastolic CHF decompensation -Likely multifactorial from uncontrolled hypertension, undertreated OSA. -Significant symptoms and signs of fluid overload and right sided CHF/cor pulmonale -Start Lasix 60 mg twice daily IV -Suspect decompensated respiratory acidosis, CPAP -Add Diamox daily -Fluid restriction, daily weight and I&O's. -DVT study, and Echo  Acute hypoxic and hypercapnic respiratory failure -Secondary to CHF decompensation and untreated OSA and body habitus. -pH 7.23, PCO2 98, implying still in decompensation, ordered BiPAP 12/4, escalate to PCU, repeat VBG in 3 hours and tomorrow morning. -Breathing treatment of DuoNeb every 6 hours, guaifenesin.  HTN, uncontrolled -From non-compliant with BP  meds -Hold CCB due to worsening of peripheral edema -Start Coreg and lisinopril, add as needed hydralazine -Lasix twice daily as above.  Morbid obesity -Recommend outpatient bariatric evaluation  OSA -Educated patient and his wife at bedside regarding noncompliant with CPAP increased risk of CHF and sudden death.  Chronic ambulation dysfunction -Related to severe morbid obesity, bedbound for 2+ years, has home PT 3 times a week.  PT evaluation.  DVT prophylaxis: Lovenox Code Status: Full code Family Communication: Wife over the  phone Disposition Plan: Expect more than 2 midnight hospital stay Consults called: None Admission status: PCU   Emeline General MD [facility] Pager [phone]  05/31/2021, 8:07 PM

## 2021-05-31 NOTE — ED Notes (Signed)
Assisted patient in urinating, cleaned pt up after voiding. Purewick on.

## 2021-05-31 NOTE — ED Notes (Signed)
Help get patient on the monitor did ekg shown to Dr Pickering patient is resting with call bell in reach  ?

## 2021-06-01 ENCOUNTER — Inpatient Hospital Stay (HOSPITAL_COMMUNITY): Payer: Medicare HMO

## 2021-06-01 DIAGNOSIS — J9601 Acute respiratory failure with hypoxia: Secondary | ICD-10-CM | POA: Diagnosis not present

## 2021-06-01 DIAGNOSIS — I5031 Acute diastolic (congestive) heart failure: Secondary | ICD-10-CM

## 2021-06-01 DIAGNOSIS — R609 Edema, unspecified: Secondary | ICD-10-CM

## 2021-06-01 DIAGNOSIS — R0902 Hypoxemia: Secondary | ICD-10-CM | POA: Diagnosis not present

## 2021-06-01 DIAGNOSIS — J9602 Acute respiratory failure with hypercapnia: Secondary | ICD-10-CM | POA: Diagnosis not present

## 2021-06-01 DIAGNOSIS — J9690 Respiratory failure, unspecified, unspecified whether with hypoxia or hypercapnia: Secondary | ICD-10-CM | POA: Diagnosis present

## 2021-06-01 LAB — I-STAT VENOUS BLOOD GAS, ED
Acid-Base Excess: 8 mmol/L — ABNORMAL HIGH (ref 0.0–2.0)
Acid-Base Excess: 5 mmol/L — ABNORMAL HIGH (ref 0.0–2.0)
Bicarbonate: 40.6 mmol/L — ABNORMAL HIGH (ref 20.0–28.0)
Bicarbonate: 38.7 mmol/L — ABNORMAL HIGH (ref 20.0–28.0)
Calcium, Ion: 1.11 mmol/L — ABNORMAL LOW (ref 1.15–1.40)
Calcium, Ion: 1.13 mmol/L — ABNORMAL LOW (ref 1.15–1.40)
HCT: 50 % (ref 39.0–52.0)
HCT: 49 % (ref 39.0–52.0)
Hemoglobin: 17 g/dL (ref 13.0–17.0)
Hemoglobin: 16.7 g/dL (ref 13.0–17.0)
O2 Saturation: 27 %
O2 Saturation: 85 %
Potassium: 5 mmol/L (ref 3.5–5.1)
Potassium: 4.8 mmol/L (ref 3.5–5.1)
Sodium: 141 mmol/L (ref 135–145)
Sodium: 140 mmol/L (ref 135–145)
TCO2: 44 mmol/L — ABNORMAL HIGH (ref 22–32)
TCO2: 42 mmol/L — ABNORMAL HIGH (ref 22–32)
pCO2, Ven: 98.1 mmHg (ref 44.0–60.0)
pCO2, Ven: 103.4 mmHg (ref 44.0–60.0)
pH, Ven: 7.225 — ABNORMAL LOW (ref 7.250–7.430)
pH, Ven: 7.181 — CL (ref 7.250–7.430)
pO2, Ven: 23 mmHg — CL (ref 32.0–45.0)
pO2, Ven: 66 mmHg — ABNORMAL HIGH (ref 32.0–45.0)

## 2021-06-01 LAB — LACTIC ACID, PLASMA: Lactic Acid, Venous: 2.2 mmol/L (ref 0.5–1.9)

## 2021-06-01 LAB — CBC
HCT: 48.3 % (ref 39.0–52.0)
Hemoglobin: 13.6 g/dL (ref 13.0–17.0)
MCH: 27.8 pg (ref 26.0–34.0)
MCHC: 28.2 g/dL — ABNORMAL LOW (ref 30.0–36.0)
MCV: 98.8 fL (ref 80.0–100.0)
Platelets: 182 10*3/uL (ref 150–400)
RBC: 4.89 MIL/uL (ref 4.22–5.81)
RDW: 17 % — ABNORMAL HIGH (ref 11.5–15.5)
WBC: 8.9 10*3/uL (ref 4.0–10.5)
nRBC: 0.7 % — ABNORMAL HIGH (ref 0.0–0.2)

## 2021-06-01 LAB — CORTISOL: Cortisol, Plasma: 18.1 ug/dL

## 2021-06-01 LAB — COMPREHENSIVE METABOLIC PANEL
ALT: 40 U/L (ref 0–44)
AST: 28 U/L (ref 15–41)
Albumin: 3.2 g/dL — ABNORMAL LOW (ref 3.5–5.0)
Alkaline Phosphatase: 53 U/L (ref 38–126)
Anion gap: 10 (ref 5–15)
BUN: 11 mg/dL (ref 6–20)
CO2: 34 mmol/L — ABNORMAL HIGH (ref 22–32)
Calcium: 8.5 mg/dL — ABNORMAL LOW (ref 8.9–10.3)
Chloride: 96 mmol/L — ABNORMAL LOW (ref 98–111)
Creatinine, Ser: 1.02 mg/dL (ref 0.61–1.24)
GFR, Estimated: >60 mL/min (ref >60)
Glucose, Bld: 113 mg/dL — ABNORMAL HIGH (ref 70–99)
Potassium: 3.9 mmol/L (ref 3.5–5.1)
Sodium: 140 mmol/L (ref 135–145)
Total Bilirubin: 1.2 mg/dL (ref 0.3–1.2)
Total Protein: 6.7 g/dL (ref 6.5–8.1)

## 2021-06-01 LAB — HEMOGLOBIN A1C
Hgb A1c MFr Bld: 5.9 % — ABNORMAL HIGH (ref 4.8–5.6)
Mean Plasma Glucose: 122.63 mg/dL

## 2021-06-01 LAB — BLOOD GAS, VENOUS
Acid-Base Excess: 8.7 mmol/L — ABNORMAL HIGH (ref 0.0–2.0)
Bicarbonate: 33.6 mmol/L — ABNORMAL HIGH (ref 20.0–28.0)
Drawn by: 5810
FIO2: 60
O2 Saturation: 62 %
Patient temperature: 37
pCO2, Ven: 54.8 mmHg (ref 44.0–60.0)
pH, Ven: 7.404 (ref 7.250–7.430)
pO2, Ven: 33.2 mmHg (ref 32.0–45.0)

## 2021-06-01 LAB — ECHOCARDIOGRAM COMPLETE
Area-P 1/2: 2.83 cm2
Height: 71 in
S' Lateral: 3.8 cm
Weight: 7840 oz

## 2021-06-01 LAB — PHOSPHORUS: Phosphorus: 3.2 mg/dL (ref 2.5–4.6)

## 2021-06-01 LAB — BASIC METABOLIC PANEL
Anion gap: 6 (ref 5–15)
BUN: 6 mg/dL (ref 6–20)
CO2: 39 mmol/L — ABNORMAL HIGH (ref 22–32)
Calcium: 8.3 mg/dL — ABNORMAL LOW (ref 8.9–10.3)
Chloride: 95 mmol/L — ABNORMAL LOW (ref 98–111)
Creatinine, Ser: 0.8 mg/dL (ref 0.61–1.24)
GFR, Estimated: >60 mL/min (ref >60)
Glucose, Bld: 116 mg/dL — ABNORMAL HIGH (ref 70–99)
Potassium: 4.5 mmol/L (ref 3.5–5.1)
Sodium: 140 mmol/L (ref 135–145)

## 2021-06-01 LAB — URINALYSIS, ROUTINE W REFLEX MICROSCOPIC
Bilirubin Urine: NEGATIVE
Glucose, UA: NEGATIVE mg/dL
Hgb urine dipstick: NEGATIVE
Ketones, ur: NEGATIVE mg/dL
Leukocytes,Ua: NEGATIVE
Nitrite: NEGATIVE
Protein, ur: NEGATIVE mg/dL
Specific Gravity, Urine: 1.006 (ref 1.005–1.030)
pH: 5 (ref 5.0–8.0)

## 2021-06-01 LAB — I-STAT ARTERIAL BLOOD GAS, ED
Acid-Base Excess: 6 mmol/L — ABNORMAL HIGH (ref 0.0–2.0)
Acid-Base Excess: 8 mmol/L — ABNORMAL HIGH (ref 0.0–2.0)
Bicarbonate: 38.3 mmol/L — ABNORMAL HIGH (ref 20.0–28.0)
Bicarbonate: 35.7 mmol/L — ABNORMAL HIGH (ref 20.0–28.0)
Calcium, Ion: 1.19 mmol/L (ref 1.15–1.40)
Calcium, Ion: 1.15 mmol/L (ref 1.15–1.40)
HCT: 50 % (ref 39.0–52.0)
HCT: 47 % (ref 39.0–52.0)
Hemoglobin: 17 g/dL (ref 13.0–17.0)
Hemoglobin: 16 g/dL (ref 13.0–17.0)
O2 Saturation: 84 %
O2 Saturation: 87 %
Patient temperature: 98.4
Patient temperature: 97.8
Potassium: 4.6 mmol/L (ref 3.5–5.1)
Potassium: 4.6 mmol/L (ref 3.5–5.1)
Sodium: 138 mmol/L (ref 135–145)
Sodium: 140 mmol/L (ref 135–145)
TCO2: 41 mmol/L — ABNORMAL HIGH (ref 22–32)
TCO2: 38 mmol/L — ABNORMAL HIGH (ref 22–32)
pCO2 arterial: 88.4 mmHg (ref 32.0–48.0)
pCO2 arterial: 59.5 mmHg — ABNORMAL HIGH (ref 32.0–48.0)
pH, Arterial: 7.244 — ABNORMAL LOW (ref 7.350–7.450)
pH, Arterial: 7.384 (ref 7.350–7.450)
pO2, Arterial: 60 mmHg — ABNORMAL LOW (ref 83.0–108.0)
pO2, Arterial: 55 mmHg — ABNORMAL LOW (ref 83.0–108.0)

## 2021-06-01 LAB — POCT I-STAT 7, (LYTES, BLD GAS, ICA,H+H)
Acid-Base Excess: 9 mmol/L — ABNORMAL HIGH (ref 0.0–2.0)
Bicarbonate: 34 mmol/L — ABNORMAL HIGH (ref 20.0–28.0)
Calcium, Ion: 1.12 mmol/L — ABNORMAL LOW (ref 1.15–1.40)
HCT: 47 % (ref 39.0–52.0)
Hemoglobin: 16 g/dL (ref 13.0–17.0)
O2 Saturation: 91 %
Patient temperature: 97.8
Potassium: 3.8 mmol/L (ref 3.5–5.1)
Sodium: 139 mmol/L (ref 135–145)
TCO2: 35 mmol/L — ABNORMAL HIGH (ref 22–32)
pCO2 arterial: 45.5 mmHg (ref 32.0–48.0)
pH, Arterial: 7.479 — ABNORMAL HIGH (ref 7.350–7.450)
pO2, Arterial: 56 mmHg — ABNORMAL LOW (ref 83.0–108.0)

## 2021-06-01 LAB — RAPID URINE DRUG SCREEN, HOSP PERFORMED
Amphetamines: NOT DETECTED
Barbiturates: NOT DETECTED
Benzodiazepines: NOT DETECTED
Cocaine: NOT DETECTED
Opiates: NOT DETECTED
Tetrahydrocannabinol: NOT DETECTED

## 2021-06-01 LAB — PROCALCITONIN: Procalcitonin: 0.55 ng/mL

## 2021-06-01 LAB — PROTIME-INR
INR: 1.1 (ref 0.8–1.2)
Prothrombin Time: 13.7 seconds (ref 11.4–15.2)

## 2021-06-01 LAB — HIV ANTIBODY (ROUTINE TESTING W REFLEX): HIV Screen 4th Generation wRfx: NONREACTIVE

## 2021-06-01 LAB — CBG MONITORING, ED: Glucose-Capillary: 110 mg/dL — ABNORMAL HIGH (ref 70–99)

## 2021-06-01 LAB — MAGNESIUM: Magnesium: 1.9 mg/dL (ref 1.7–2.4)

## 2021-06-01 LAB — LIPASE, BLOOD: Lipase: 22 U/L (ref 11–51)

## 2021-06-01 LAB — AMYLASE: Amylase: 52 U/L (ref 28–100)

## 2021-06-01 LAB — APTT: aPTT: 21 seconds — ABNORMAL LOW (ref 24–36)

## 2021-06-01 MED ORDER — LORAZEPAM 2 MG/ML IJ SOLN
1.0000 mg | Freq: Once | INTRAMUSCULAR | Status: AC
  Administered 2021-06-01: 1 mg via INTRAVENOUS
  Filled 2021-06-01: qty 1

## 2021-06-01 MED ORDER — ALLOPURINOL 300 MG PO TABS
300.0000 mg | ORAL_TABLET | Freq: Two times a day (BID) | ORAL | Status: DC
  Administered 2021-06-01 – 2021-06-07 (×12): 300 mg
  Filled 2021-06-01 (×13): qty 1

## 2021-06-01 MED ORDER — ROCURONIUM BROMIDE 10 MG/ML (PF) SYRINGE
PREFILLED_SYRINGE | INTRAVENOUS | Status: AC
  Filled 2021-06-01: qty 10

## 2021-06-01 MED ORDER — HYDRALAZINE HCL 20 MG/ML IJ SOLN
5.0000 mg | Freq: Four times a day (QID) | INTRAMUSCULAR | Status: DC | PRN
  Administered 2021-06-02: 5 mg via INTRAVENOUS
  Filled 2021-06-01: qty 1

## 2021-06-01 MED ORDER — FENTANYL CITRATE PF 50 MCG/ML IJ SOSY
PREFILLED_SYRINGE | INTRAMUSCULAR | Status: AC
  Filled 2021-06-01: qty 2

## 2021-06-01 MED ORDER — ETOMIDATE 2 MG/ML IV SOLN
30.0000 mg | Freq: Once | INTRAVENOUS | Status: AC
  Administered 2021-06-01: 30 mg via INTRAVENOUS

## 2021-06-01 MED ORDER — ACETAMINOPHEN 650 MG RE SUPP: 325.0000 mg | RECTAL | Status: DC | PRN

## 2021-06-01 MED ORDER — PANTOPRAZOLE SODIUM 40 MG IV SOLR: 40.0000 mg | Freq: Every day | INTRAVENOUS | Status: DC

## 2021-06-01 MED ORDER — ALBUMIN HUMAN 5 % IV SOLN
12.5000 g | Freq: Once | INTRAVENOUS | Status: AC
  Administered 2021-06-01: 12.5 g via INTRAVENOUS
  Filled 2021-06-01: qty 250

## 2021-06-01 MED ORDER — SODIUM CHLORIDE 0.9 % IV SOLN: INTRAVENOUS | Status: DC

## 2021-06-01 MED ORDER — SUCCINYLCHOLINE CHLORIDE 200 MG/10ML IV SOSY
PREFILLED_SYRINGE | INTRAVENOUS | Status: AC
  Filled 2021-06-01: qty 10

## 2021-06-01 MED ORDER — MIDAZOLAM HCL 2 MG/2ML IJ SOLN
2.0000 mg | INTRAMUSCULAR | Status: AC | PRN
  Administered 2021-06-04 – 2021-06-07 (×3): 2 mg via INTRAVENOUS
  Filled 2021-06-01 (×4): qty 2

## 2021-06-01 MED ORDER — ACETAMINOPHEN 325 MG PO TABS: 650.0000 mg | ORAL_TABLET | ORAL | Status: DC | PRN

## 2021-06-01 MED ORDER — CHLORHEXIDINE GLUCONATE 0.12% ORAL RINSE (MEDLINE KIT)
15.0000 mL | Freq: Two times a day (BID) | OROMUCOSAL | Status: DC
  Administered 2021-06-01 – 2021-06-07 (×12): 15 mL via OROMUCOSAL

## 2021-06-01 MED ORDER — ETOMIDATE 2 MG/ML IV SOLN
INTRAVENOUS | Status: AC
  Filled 2021-06-01: qty 20

## 2021-06-01 MED ORDER — ORAL CARE MOUTH RINSE
15.0000 mL | OROMUCOSAL | Status: DC
  Administered 2021-06-02 – 2021-06-07 (×55): 15 mL via OROMUCOSAL

## 2021-06-01 MED ORDER — PERFLUTREN LIPID MICROSPHERE
1.0000 mL | INTRAVENOUS | Status: AC | PRN
  Administered 2021-06-01: 1 mL via INTRAVENOUS
  Filled 2021-06-01: qty 10

## 2021-06-01 MED ORDER — SODIUM CHLORIDE 0.9% FLUSH
10.0000 mL | Freq: Two times a day (BID) | INTRAVENOUS | Status: DC
  Administered 2021-06-01 – 2021-06-13 (×18): 10 mL

## 2021-06-01 MED ORDER — PROPOFOL 1000 MG/100ML IV EMUL
5.0000 ug/kg/min | INTRAVENOUS | Status: DC
  Administered 2021-06-01: 30 ug/kg/min via INTRAVENOUS
  Filled 2021-06-01: qty 100

## 2021-06-01 MED ORDER — FENTANYL CITRATE PF 50 MCG/ML IJ SOSY: 50.0000 ug | PREFILLED_SYRINGE | INTRAMUSCULAR | Status: DC | PRN

## 2021-06-01 MED ORDER — DEXMEDETOMIDINE HCL IN NACL 400 MCG/100ML IV SOLN
0.0000 ug/kg/h | INTRAVENOUS | Status: DC
  Administered 2021-06-01 (×4): 1.2 ug/kg/h via INTRAVENOUS
  Administered 2021-06-01: 1 ug/kg/h via INTRAVENOUS
  Administered 2021-06-01: 1.2 ug/kg/h via INTRAVENOUS
  Administered 2021-06-01: 1.1 ug/kg/h via INTRAVENOUS
  Administered 2021-06-01: 1.2 ug/kg/h via INTRAVENOUS
  Administered 2021-06-01: 0.5 ug/kg/h via INTRAVENOUS
  Administered 2021-06-02: 1 ug/kg/h via INTRAVENOUS
  Administered 2021-06-02: 1.1 ug/kg/h via INTRAVENOUS
  Administered 2021-06-02: 1.2 ug/kg/h via INTRAVENOUS
  Administered 2021-06-02: 1 ug/kg/h via INTRAVENOUS
  Administered 2021-06-02: 1.2 ug/kg/h via INTRAVENOUS
  Administered 2021-06-02: 1 ug/kg/h via INTRAVENOUS
  Filled 2021-06-01 (×16): qty 100

## 2021-06-01 MED ORDER — DOCUSATE SODIUM 50 MG/5ML PO LIQD
100.0000 mg | Freq: Two times a day (BID) | ORAL | Status: DC
Start: 2021-06-01 — End: 2021-06-07
  Administered 2021-06-01 – 2021-06-06 (×11): 100 mg
  Filled 2021-06-01 (×11): qty 10

## 2021-06-01 MED ORDER — MIDAZOLAM HCL 2 MG/2ML IJ SOLN
2.0000 mg | INTRAMUSCULAR | Status: DC | PRN
  Administered 2021-06-06 (×2): 2 mg via INTRAVENOUS
  Filled 2021-06-01: qty 2

## 2021-06-01 MED ORDER — ACETAZOLAMIDE 250 MG PO TABS
250.0000 mg | ORAL_TABLET | Freq: Every day | ORAL | Status: DC
  Administered 2021-06-02 – 2021-06-03 (×2): 250 mg
  Filled 2021-06-01 (×3): qty 1

## 2021-06-01 MED ORDER — CARVEDILOL 3.125 MG PO TABS
3.1250 mg | ORAL_TABLET | Freq: Two times a day (BID) | ORAL | Status: DC
  Administered 2021-06-01 – 2021-06-07 (×10): 3.125 mg
  Filled 2021-06-01 (×11): qty 1

## 2021-06-01 MED ORDER — DEXMEDETOMIDINE HCL IN NACL 400 MCG/100ML IV SOLN: 0.4000 ug/kg/h | INTRAVENOUS | Status: DC

## 2021-06-01 MED ORDER — POLYETHYLENE GLYCOL 3350 17 G PO PACK: 17.0000 g | PACK | Freq: Every day | ORAL | Status: DC | PRN

## 2021-06-01 MED ORDER — LISINOPRIL 10 MG PO TABS
10.0000 mg | ORAL_TABLET | Freq: Every day | ORAL | Status: DC
  Administered 2021-06-01 – 2021-06-04 (×4): 10 mg
  Filled 2021-06-01 (×4): qty 1

## 2021-06-01 MED ORDER — ROCURONIUM BROMIDE 50 MG/5ML IV SOLN
150.0000 mg | Freq: Once | INTRAVENOUS | Status: AC
  Administered 2021-06-01: 150 mg via INTRAVENOUS

## 2021-06-01 MED ORDER — PROPOFOL 1000 MG/100ML IV EMUL: 5.0000 ug/kg/min | INTRAVENOUS | Status: DC

## 2021-06-01 MED ORDER — CHLORHEXIDINE GLUCONATE CLOTH 2 % EX PADS
6.0000 | MEDICATED_PAD | Freq: Every day | CUTANEOUS | Status: DC
  Administered 2021-06-01 – 2021-06-16 (×15): 6 via TOPICAL

## 2021-06-01 MED ORDER — ACETAMINOPHEN 160 MG/5ML PO SOLN
650.0000 mg | ORAL | Status: DC | PRN
  Administered 2021-06-01 – 2021-06-02 (×2): 650 mg
  Filled 2021-06-01 (×2): qty 20.3

## 2021-06-01 MED ORDER — SODIUM CHLORIDE 0.9% FLUSH: 10.0000 mL | INTRAVENOUS | Status: DC | PRN

## 2021-06-01 MED ORDER — FENTANYL CITRATE (PF) 100 MCG/2ML IJ SOLN
50.0000 ug | INTRAMUSCULAR | Status: DC | PRN
  Administered 2021-06-01: 50 ug via INTRAVENOUS
  Administered 2021-06-02 (×3): 100 ug via INTRAVENOUS
  Filled 2021-06-01 (×4): qty 2

## 2021-06-01 MED ORDER — POLYETHYLENE GLYCOL 3350 17 G PO PACK
17.0000 g | PACK | Freq: Every day | ORAL | Status: DC
Start: 2021-06-01 — End: 2021-06-07
  Administered 2021-06-02 – 2021-06-06 (×5): 17 g
  Filled 2021-06-01 (×5): qty 1

## 2021-06-01 MED ORDER — KETAMINE HCL 50 MG/5ML IJ SOSY
PREFILLED_SYRINGE | INTRAMUSCULAR | Status: AC
  Filled 2021-06-01: qty 5

## 2021-06-01 MED ORDER — DOCUSATE SODIUM 100 MG PO CAPS
100.0000 mg | ORAL_CAPSULE | Freq: Two times a day (BID) | ORAL | Status: DC | PRN
  Administered 2021-06-12: 100 mg via ORAL

## 2021-06-01 MED ORDER — MIDAZOLAM HCL 2 MG/2ML IJ SOLN
INTRAMUSCULAR | Status: AC
  Filled 2021-06-01: qty 2

## 2021-06-01 MED ORDER — PANTOPRAZOLE SODIUM 40 MG PO PACK
40.0000 mg | PACK | Freq: Every day | ORAL | Status: DC
  Administered 2021-06-01 – 2021-06-02 (×2): 40 mg
  Filled 2021-06-01 (×2): qty 20

## 2021-06-01 NOTE — Progress Notes (Signed)
Bilateral lower extremity venous duplex completed. Refer to "CV Proc" under chart review to view preliminary results.  06/01/2021 2:42 PM Eula Fried., MHA, RVT, RDCS, RDMS

## 2021-06-01 NOTE — Progress Notes (Signed)
Pt transported to 2H14 from ED 29 without any complications.

## 2021-06-01 NOTE — H&P (Signed)
NAME:  Michael Payne, MRN:  [id number], DOB:  06/22/1971, LOS: 1 ADMISSION DATE:  05/31/2021, CONSULTATION DATE: 06/01/2021 REFERRING MD: EDP, CHIEF COMPLAINT: Acute on chronic respiratory failure  History of Present Illness:  50 year old male with extensive past medical history is well-documented below who presents to [facility] 05/31/2021 with worsening respiratory failure, hypercarbia, hypoxia.  He was placed on noninvasive mechanical ventilatory support with a with a recorded PCO2 greater than 100.  After failing noninvasive interventions he was intubated by the emergency department physician on 06/01/2021 at 0 730.  Pulmonary critical care asked to assume care and admit to the intensive care unit.  Pertinent  Medical History   Past Medical History:  Diagnosis Date   Anemia    borderline anemia, was instructed to eat more iron filled foods   GERD (gastroesophageal reflux disease)    lactose intolerance   Hip pain    HTN (hypertension)    Obesity    Sleep apnea      Significant Hospital Events: Including procedures, antibiotic start and stop dates in addition to other pertinent events   06/01/2021 intubation per EDP  Interim History / Subjective:  50 year old morbidly obese male who is noncompliant with CPAP unable to confirm whether or not he is taking his blood pressure medication is admitted with acute hypercarbic respiratory failure requiring intubation in the emergency department  Objective   Blood pressure 139/87, pulse (!) 103, temperature 98.4 F (36.9 C), temperature source Oral, resp. rate (!) 21, height 5\' 11"  (1.803 m), weight (!) 222.3 kg, SpO2 99 %.    Vent Mode: PRVC FiO2 (%):  [100 %] 100 % Set Rate:  [24 bmp] 24 bmp Vt Set:  [600 mL] 600 mL PEEP:  [5 cmH20] 5 cmH20 Plateau Pressure:  [29 cmH20] 29 cmH20   Intake/Output Summary (Last 24 hours) at 06/01/2021 0754 Last data filed at 06/01/2021 0004 Gross per 24 hour  Intake --  Output 700 ml  Net -700  ml   Filed Weights   05/31/21 1516  Weight: (!) 222.3 kg    Examination: General: Morbid obese male heavily sedated on propofol no acute distress at rest HENT: No neck no JVD is appreciated Lungs: Coarse rhonchi and wheeze bilateral decreased breath sounds on the left Cardiovascular: Heart sounds regular regular rate rhythm Abdomen: Obese soft nontender Extremities: Bilateral lower extremity edema 2-3+. Neuro: Currently heavily sedated on propofol GU: Catheter in place  Resolved Hospital Problem list     Assessment & Plan:  Ventilator dependent respiratory failure she is multifactorial with morbid obesity of weight greater than 450 pounds along with chronic hypercarbic respiratory failure in the setting obstructive sleep apnea was noncompliant with CPAP.  Required intubation and admission to the intensive care unit.  Complicated by diastolic heart failure. Vent bundle Admit to the intensive care unit Bronchodilator Aggressive diuresis Recheck 2D echo last done in 2013  Morbid obesity at 490 pounds. Continue diuresis Weight loss in the future  Congestive heart failure Aggressive diuresis  Diabetes mellitus CBG (last 3)  Recent Labs    06/01/21 0002  GLUCAP 110*   Sliding-scale insulin protocol     Best Practice (right click and "Reselect all SmartList Selections" daily)   Diet/type: NPO DVT prophylaxis: LMWH GI prophylaxis: PPI Lines: N/A Foley:  N/A Code Status:  full code Last date of multidisciplinary goals of care discussion [TBD]  Labs   CBC: Recent Labs  Lab 05/31/21 1544 05/31/21 1916 06/01/21 0202 06/01/21 0218 06/01/21  0630  WBC 10.0  --   --   --   --   NEUTROABS 7.7  --   --   --   --   HGB 14.5 17.7* 17.0 17.0 16.7  HCT 51.3 52.0 50.0 50.0 49.0  MCV 99.4  --   --   --   --   PLT 264  --   --   --   --     Basic Metabolic Panel: Recent Labs  Lab 05/31/21 1544 05/31/21 1916 06/01/21 0045 06/01/21 0202 06/01/21 0218  06/01/21 0630  NA 138 140 140 141 138 140  K 4.6 4.8 4.5 5.0 4.6 4.8  CL 96*  --  95*  --   --   --   CO2 34*  --  39*  --   --   --   GLUCOSE 116*  --  116*  --   --   --   BUN 6  --  6  --   --   --   CREATININE 0.69  --  0.80  --   --   --   CALCIUM 8.4*  --  8.3*  --   --   --    GFR: Estimated Creatinine Clearance: 211.9 mL/min (by C-G formula based on SCr of 0.8 mg/dL). Recent Labs  Lab 05/31/21 1544  WBC 10.0    Liver Function Tests: No results for input(s): AST, ALT, ALKPHOS, BILITOT, PROT, ALBUMIN in the last 168 hours. No results for input(s): LIPASE, AMYLASE in the last 168 hours. No results for input(s): AMMONIA in the last 168 hours.  ABG    Component Value Date/Time   PHART 7.244 (L) 06/01/2021 0218   PCO2ART 88.4 (HH) 06/01/2021 0218   PO2ART 60 (L) 06/01/2021 0218   HCO3 38.7 (H) 06/01/2021 0630   TCO2 42 (H) 06/01/2021 0630   O2SAT 85.0 06/01/2021 0630     Coagulation Profile: No results for input(s): INR, PROTIME in the last 168 hours.  Cardiac Enzymes: No results for input(s): CKTOTAL, CKMB, CKMBINDEX, TROPONINI in the last 168 hours.  HbA1C: Hemoglobin A1C  Date/Time Value Ref Range Status  05/06/2019 04:25 PM 5.6 4.0 - 5.6 % Final   Hgb A1c MFr Bld  Date/Time Value Ref Range Status  03/09/2021 12:32 PM 6.5 4.6 - 6.5 % Final    Comment:    Glycemic Control Guidelines for People with Diabetes:Non Diabetic:  <6%Goal of Therapy: <7%Additional Action Suggested:  >8%   10/29/2018 12:49 PM 5.6 4.6 - 6.5 % Final    Comment:    Glycemic Control Guidelines for People with Diabetes:Non Diabetic:  <6%Goal of Therapy: <7%Additional Action Suggested:  >8%     CBG: Recent Labs  Lab 06/01/21 0002  GLUCAP 110*    Review of Systems:   NA  Past Medical History:  He,  has a past medical history of Anemia, GERD (gastroesophageal reflux disease), Hip pain, HTN (hypertension), Obesity, and Sleep apnea.   Surgical History:   Past Surgical History:   Procedure Laterality Date   FOOT SURGERY     KNEE ARTHROSCOPY WITH MEDIAL MENISECTOMY Left 12/16/2014   Procedure: KNEE ARTHROSCOPY WITH MEDIAL MENISECTOMY;  Surgeon: Sheral Apley, MD;  Location: [facility] OR;  Service: Orthopedics;  Laterality: Left;     Social History:   reports that he quit smoking about 10 years ago. His smoking use included cigarettes. He has a 44.00 pack-year smoking history. He has never used smokeless tobacco. He reports  that he does not currently use alcohol. He reports that he does not currently use drugs after having used the following drugs: Marijuana.   Family History:  His family history includes CAD in his maternal grandfather; Cancer in his father; Heart attack (age of onset: 78) in his mother; Lung cancer in his father.   Allergies Allergies  Allergen Reactions   Aspirin Other (See Comments)    Upset stomach   Lactose Intolerance (Gi) Nausea And Vomiting     Home Medications  Prior to Admission medications   Medication Sig Start Date End Date Taking? Authorizing Provider  allopurinol (ZYLOPRIM) 300 MG tablet TAKE 1 TABLET TWICE DAILY Patient taking differently: Take 300 mg by mouth 2 (two) times daily. 06/25/20  Yes Emi Belfast, FNP  amLODipine (NORVASC) 10 MG tablet Take 1 tablet (10 mg total) by mouth daily. 10/21/20  Yes Joaquim Nam, MD  colchicine (COLCRYS) 0.6 MG tablet Take 1 tablet (0.6 mg total) by mouth daily as needed. 03/09/21  Yes Joaquim Nam, MD  diclofenac (VOLTAREN) 75 MG EC tablet TAKE 1 TABLET BY MOUTH 2 TIMES DAILY AS NEEDED. TAKE WITH FOOD. Patient taking differently: Take 75 mg by mouth 2 (two) times daily with a meal. 05/05/21  Yes Joaquim Nam, MD  Dulaglutide (TRULICITY) 0.75 MG/0.5ML SOPN Inject 0.75 mg into the skin once a week. 03/16/21  Yes Joaquim Nam, MD  naloxone [facility]) nasal spray 4 mg/0.1 mL Spray nostril if needed for opioid overuse- decreased consciousness, decreased respirations 09/02/20  Yes  Emi Belfast, FNP  oxyCODONE-acetaminophen (PERCOCET) 10-325 MG tablet Take 1 tablet by mouth every 6 (six) hours as needed for pain. 05/07/21  Yes Joaquim Nam, MD  tiZANidine (ZANAFLEX) 4 MG tablet Take 1 tablet (4 mg total) by mouth every 8 (eight) hours as needed for muscle spasms. 12/13/20  Yes Joaquim Nam, MD  lidocaine (LIDODERM) 5 % PLACE 1 PATCH ONTO THE SKIN DAILY. REMOVE & DISCARD [facility] WITHIN 12 HOURS Patient not taking: No sig reported 05/19/21   Joaquim Nam, MD     Critical care time: 16 min   Brett Canales Amaris Delafuente ACNP Acute Care Nurse Practitioner [facility] Pulmonary/Critical Care Please consult Amion 06/01/2021, 7:55 AM

## 2021-06-01 NOTE — Progress Notes (Signed)
Echo attempted at 11:30, patient getting settled and labs, prepping for sterile procedure. Will re-attempt as schedule permits. [facility] Vivian Neuwirth RDCS

## 2021-06-01 NOTE — Progress Notes (Signed)
This nurse assessed L arm for PIV placement. MD arrived to place CVC line. Tomasita Morrow, RN VAST

## 2021-06-01 NOTE — Progress Notes (Addendum)
Notified by RN that pt came back from CT scan and was more confused.  Reviewed chart and had pCO2 of 98 at admission. Was off Bipap for CT scan. Repeat VBG has not been obtained to recheck level since admission  Obtain VBG now.    Addendum: BP started to trend down to 94/53 with MAP of 65.  Will give albumin to try and bring up pressure with oncotic pressure in vessels  Addendum: 0640 am Pt has become more agitated and lethargic. Is on bipap and pCO2 has increased to 103.  ER physician is going to intubate now.  Called PCCM for consult to assist with pt care

## 2021-06-01 NOTE — ED Notes (Signed)
Sent Dr Rachael Darby a secure chat in regards to Pt appearing slightly confused s/p returning form CT awaiting response/orders.

## 2021-06-01 NOTE — ED Notes (Signed)
Provider treatment team at bedside to evaluate. Propofol decreased in half to secondary to SBP of 80

## 2021-06-01 NOTE — Progress Notes (Signed)
PT Cancellation Note  Patient Details Name: Michael Payne MRN: [id number] DOB: 04-08-1971   Cancelled Treatment:    Reason Eval/Treat Not Completed: Patient not medically ready (PT order written in ED, pt currently intubated and not medically appropriate. Will sign off and await new order)   [name] [name] 06/01/2021, 11:02 AM Merryl Hacker, PT Acute Rehabilitation Services Pager: [phone] Office: [phone]

## 2021-06-01 NOTE — Progress Notes (Signed)
   06/01/21 0900  Clinical Encounter Type  Visited With Patient and family together  Visit Type Initial;Psychological support;Spiritual support;Social support;ED;Critical Care  Referral From Nurse;Family  Consult/Referral To Chaplain  Spiritual Encounters  Spiritual Needs Emotional;Prayer   [facility] paged to provide support for pt.'s wife Michael Payne at bedside; when [facility] arrived, pt. lying in bed, intubated and sedated; wife Michael Payne shared that pt. was admitted to [facility] yesterday afternoon after developing severe difficulty breathing with O2 sats reportedly in the 50's.  Pt. intitially was placed on BiPap but was intubated last night.  Wife shared that she knew pt. was intubated but "wasn't prepared for what I saw when I got here this morning" and was distressed to see pt. appear so ill.  Pt. has an adult daughter who stayed with him last night as well as several younger children.  [facility] offered supportive presence and prayed for pt. and his wife, with her permission.  Pt. scheduled for transfer to ICU.  Chaplains remain available as needed.  Elpidio Anis, Chaplain Pager: [phone]

## 2021-06-01 NOTE — ED Provider Notes (Signed)
Patient boarding on the hospitalist service.  Progressive hypercarbic respiratory failure with increased agitation.  Patient acutely worsening on BiPAP overnight.  Repeat ABG this morning with evidence of respiratory acidosis and a PCO2 of 103.  Discussed with hospitalist, Dr. Rachael Darby.  Requested intubation.  He will contact ICU for ongoing management of this patient.     Physical Exam  BP 139/87   Pulse 89   Temp 98.4 F (36.9 C) (Oral)   Resp 16   Ht 1.93 m (6\' 4" )   Wt (!) 222.3 kg   SpO2 96%   BMI 59.64 kg/m   Physical Exam  Morbidly obese, on BiPAP Will open eyes to voice but will not follow commands Coarse breath sounds in all lung fields, severely limited by body habitus   ED Course/Procedures     Procedure Name: Intubation Date/Time: 06/01/2021 7:07 AM Performed by: 06/03/2021, MD Pre-anesthesia Checklist: Patient identified, Patient being monitored, Emergency Drugs available, Timeout performed and Suction available Oxygen Delivery Method: Non-rebreather mask Preoxygenation: Pre-oxygenation with 100% oxygen Induction Type: Rapid sequence Ventilation: Mask ventilation without difficulty Laryngoscope Size: Glidescope and 4 Grade View: Grade I Tube size: 7.5 mm Number of attempts: 1 Airway Equipment and Method: Patient positioned with wedge pillow Placement Confirmation: ETT inserted through vocal cords under direct vision, CO2 detector and Breath sounds checked- equal and bilateral Secured at: 27 cm Tube secured with: ETT holder Dental Injury: Teeth and Oropharynx as per pre-operative assessment      MDM    Hypercarbic respiratory failure       Shon Baton, MD 06/01/21 [phone]

## 2021-06-01 NOTE — Procedures (Signed)
Central Venous Catheter Insertion Procedure Note  Michael Payne  [id number]  02/20/1971  Date:06/01/21  Time:1:54 PM   Provider Performing:Barabara Motz   Procedure: Insertion of Non-tunneled Central Venous [phone]) with US guidance ([id number])   Indication(s) Medication administration  Consent Risks of the procedure as well as the alternatives and risks of each were explained to the patient and/or caregiver.  Consent for the procedure was obtained and is signed in the bedside chart  Anesthesia Topical only with 1% lidocaine   Timeout Verified patient identification, verified procedure, site/side was marked, verified correct patient position, special equipment/implants available, medications/allergies/relevant history reviewed, required imaging and test results available.  Sterile Technique Maximal sterile technique including full sterile barrier drape, hand hygiene, sterile gown, sterile gloves, mask, hair covering, sterile ultrasound probe cover (if used).  Procedure Description Area of catheter insertion was cleaned with chlorhexidine and draped in sterile fashion.  With real-time ultrasound guidance a central venous catheter was placed into the right internal jugular vein. Nonpulsatile blood flow and easy flushing noted in all ports.  The catheter was sutured in place and sterile dressing applied.  Complications/Tolerance None; patient tolerated the procedure well. Chest X-ray is ordered to verify placement for internal jugular or subclavian cannulation.   Chest x-ray is not ordered for femoral cannulation.  EBL Minimal  Specimen(s) None

## 2021-06-01 NOTE — Progress Notes (Signed)
Heart Failure Nurse Navigator Progress Note  Pt currently intubated. Will continue to follow hospitalization to determine HV TOC readiness.   Ozella Rocks, MSN, RN Heart Failure Nurse Navigator [phone]

## 2021-06-02 ENCOUNTER — Inpatient Hospital Stay (HOSPITAL_COMMUNITY): Payer: Medicare HMO

## 2021-06-02 ENCOUNTER — Telehealth: Payer: Self-pay | Admitting: *Deleted

## 2021-06-02 DIAGNOSIS — R0602 Shortness of breath: Secondary | ICD-10-CM | POA: Diagnosis not present

## 2021-06-02 LAB — BASIC METABOLIC PANEL
Anion gap: 12 (ref 5–15)
BUN: 10 mg/dL (ref 6–20)
CO2: 31 mmol/L (ref 22–32)
Calcium: 8.5 mg/dL — ABNORMAL LOW (ref 8.9–10.3)
Chloride: 96 mmol/L — ABNORMAL LOW (ref 98–111)
Creatinine, Ser: 0.91 mg/dL (ref 0.61–1.24)
GFR, Estimated: >60 mL/min (ref >60)
Glucose, Bld: 147 mg/dL — ABNORMAL HIGH (ref 70–99)
Potassium: 3.5 mmol/L (ref 3.5–5.1)
Sodium: 139 mmol/L (ref 135–145)

## 2021-06-02 LAB — POCT I-STAT 7, (LYTES, BLD GAS, ICA,H+H)
Acid-Base Excess: 8 mmol/L — ABNORMAL HIGH (ref 0.0–2.0)
Bicarbonate: 32.9 mmol/L — ABNORMAL HIGH (ref 20.0–28.0)
Calcium, Ion: 1.12 mmol/L — ABNORMAL LOW (ref 1.15–1.40)
HCT: 48 % (ref 39.0–52.0)
Hemoglobin: 16.3 g/dL (ref 13.0–17.0)
O2 Saturation: 95 %
Patient temperature: 99.8
Potassium: 3.6 mmol/L (ref 3.5–5.1)
Sodium: 141 mmol/L (ref 135–145)
TCO2: 34 mmol/L — ABNORMAL HIGH (ref 22–32)
pCO2 arterial: 46.8 mmHg (ref 32.0–48.0)
pH, Arterial: 7.457 — ABNORMAL HIGH (ref 7.350–7.450)
pO2, Arterial: 77 mmHg — ABNORMAL LOW (ref 83.0–108.0)

## 2021-06-02 LAB — CBC WITH DIFFERENTIAL/PLATELET
Abs Immature Granulocytes: 0.07 10*3/uL (ref 0.00–0.07)
Basophils Absolute: 0 10*3/uL (ref 0.0–0.1)
Basophils Relative: 0 %
Eosinophils Absolute: 0 10*3/uL (ref 0.0–0.5)
Eosinophils Relative: 0 %
HCT: 48.4 % (ref 39.0–52.0)
Hemoglobin: 14.1 g/dL (ref 13.0–17.0)
Immature Granulocytes: 1 %
Lymphocytes Relative: 10 %
Lymphs Abs: 1.2 10*3/uL (ref 0.7–4.0)
MCH: 27.9 pg (ref 26.0–34.0)
MCHC: 29.1 g/dL — ABNORMAL LOW (ref 30.0–36.0)
MCV: 95.7 fL (ref 80.0–100.0)
Monocytes Absolute: 1.1 10*3/uL — ABNORMAL HIGH (ref 0.1–1.0)
Monocytes Relative: 9 %
Neutro Abs: 10.2 10*3/uL — ABNORMAL HIGH (ref 1.7–7.7)
Neutrophils Relative %: 80 %
Platelets: 254 10*3/uL (ref 150–400)
RBC: 5.06 MIL/uL (ref 4.22–5.81)
RDW: 17 % — ABNORMAL HIGH (ref 11.5–15.5)
WBC: 12.7 10*3/uL — ABNORMAL HIGH (ref 4.0–10.5)
nRBC: 0.2 % (ref 0.0–0.2)

## 2021-06-02 LAB — PHOSPHORUS: Phosphorus: 2.8 mg/dL (ref 2.5–4.6)

## 2021-06-02 LAB — RESPIRATORY PANEL BY PCR

## 2021-06-02 LAB — LACTIC ACID, PLASMA: Lactic Acid, Venous: 1.5 mmol/L (ref 0.5–1.9)

## 2021-06-02 LAB — MAGNESIUM: Magnesium: 2.1 mg/dL (ref 1.7–2.4)

## 2021-06-02 LAB — GLUCOSE, CAPILLARY
Glucose-Capillary: 113 mg/dL — ABNORMAL HIGH (ref 70–99)
Glucose-Capillary: 114 mg/dL — ABNORMAL HIGH (ref 70–99)
Glucose-Capillary: 141 mg/dL — ABNORMAL HIGH (ref 70–99)

## 2021-06-02 LAB — STREP PNEUMONIAE URINARY ANTIGEN: Strep Pneumo Urinary Antigen: NEGATIVE

## 2021-06-02 MED ORDER — AZITHROMYCIN 500 MG IV SOLR
500.0000 mg | INTRAVENOUS | Status: AC
Start: 2021-06-02 — End: 2021-06-06
  Administered 2021-06-02 – 2021-06-06 (×5): 500 mg via INTRAVENOUS
  Filled 2021-06-02 (×5): qty 500

## 2021-06-02 MED ORDER — VITAL 1.5 CAL PO LIQD
1000.0000 mL | ORAL | Status: DC
  Administered 2021-06-02 – 2021-06-05 (×4): 1000 mL

## 2021-06-02 MED ORDER — SODIUM CHLORIDE 0.9 % IV SOLN
2.0000 g | INTRAVENOUS | Status: AC
  Administered 2021-06-03 – 2021-06-06 (×4): 2 g via INTRAVENOUS
  Filled 2021-06-02 (×4): qty 20

## 2021-06-02 MED ORDER — FENTANYL 2500MCG IN NS 250ML (10MCG/ML) PREMIX INFUSION
50.0000 ug/h | INTRAVENOUS | Status: DC
  Administered 2021-06-02: 50 ug/h via INTRAVENOUS
  Administered 2021-06-02 – 2021-06-03 (×2): 150 ug/h via INTRAVENOUS
  Administered 2021-06-04 – 2021-06-07 (×7): 200 ug/h via INTRAVENOUS
  Filled 2021-06-02 (×10): qty 250

## 2021-06-02 MED ORDER — PROSOURCE TF PO LIQD
90.0000 mL | Freq: Three times a day (TID) | ORAL | Status: DC
  Administered 2021-06-02 – 2021-06-07 (×15): 90 mL
  Filled 2021-06-02 (×15): qty 90

## 2021-06-02 MED ORDER — FENTANYL BOLUS VIA INFUSION
50.0000 ug | INTRAVENOUS | Status: DC | PRN
  Administered 2021-06-02 (×2): 100 ug via INTRAVENOUS
  Administered 2021-06-02: 50 ug via INTRAVENOUS
  Administered 2021-06-02: 100 ug via INTRAVENOUS
  Filled 2021-06-02: qty 100

## 2021-06-02 MED ORDER — ENOXAPARIN SODIUM 100 MG/ML IJ SOSY
100.0000 mg | PREFILLED_SYRINGE | INTRAMUSCULAR | Status: DC
  Administered 2021-06-02 – 2021-06-27 (×25): 100 mg via SUBCUTANEOUS
  Filled 2021-06-02 (×31): qty 1

## 2021-06-02 MED ORDER — INSULIN ASPART 100 UNIT/ML IJ SOLN
2.0000 [IU] | INTRAMUSCULAR | Status: DC
  Administered 2021-06-02: 4 [IU] via SUBCUTANEOUS
  Administered 2021-06-03 (×2): 2 [IU] via SUBCUTANEOUS
  Administered 2021-06-03: 4 [IU] via SUBCUTANEOUS
  Administered 2021-06-04 – 2021-06-05 (×4): 2 [IU] via SUBCUTANEOUS
  Administered 2021-06-05 – 2021-06-06 (×2): 4 [IU] via SUBCUTANEOUS
  Administered 2021-06-06 – 2021-06-07 (×6): 2 [IU] via SUBCUTANEOUS
  Administered 2021-06-07: 4 [IU] via SUBCUTANEOUS
  Administered 2021-06-07: 2 [IU] via SUBCUTANEOUS
  Administered 2021-06-08: 4 [IU] via SUBCUTANEOUS
  Administered 2021-06-08: 2 [IU] via SUBCUTANEOUS
  Administered 2021-06-09: 4 [IU] via SUBCUTANEOUS
  Administered 2021-06-09: 2 [IU] via SUBCUTANEOUS
  Administered 2021-06-09: 5 [IU] via SUBCUTANEOUS
  Administered 2021-06-09: 4 [IU] via SUBCUTANEOUS
  Administered 2021-06-09 – 2021-06-10 (×2): 2 [IU] via SUBCUTANEOUS
  Administered 2021-06-10: 4 [IU] via SUBCUTANEOUS
  Administered 2021-06-10: 2 [IU] via SUBCUTANEOUS

## 2021-06-02 MED ORDER — SODIUM CHLORIDE 0.9 % IV SOLN
1.0000 g | INTRAVENOUS | Status: DC
  Administered 2021-06-02: 1 g via INTRAVENOUS
  Filled 2021-06-02: qty 10

## 2021-06-02 MED ORDER — POTASSIUM CHLORIDE 20 MEQ PO PACK
40.0000 meq | PACK | Freq: Once | ORAL | Status: AC
  Administered 2021-06-02: 40 meq
  Filled 2021-06-02: qty 2

## 2021-06-02 NOTE — Progress Notes (Signed)
Initial Nutrition Assessment  DOCUMENTATION CODES:   Morbid obesity  INTERVENTION:   Tube feeding:  -Vital 1.5 @ 20 ml/hr via OG -Increase by 10 ml Q4 hours to goal rate of 55 ml/hr (1320 ml) -ProSource TF 90 ml TID  Provides: 2260 kcals, 155 grams protein, 1008 ml free water.   NUTRITION DIAGNOSIS:   Increased nutrient needs related to acute illness as evidenced by estimated needs.  GOAL:   Patient will meet greater than or equal to 90% of their needs  MONITOR:   Vent status, Labs, I & O's, Skin, TF tolerance, Weight trends  REASON FOR ASSESSMENT:   Consult, Ventilator Enteral/tube feeding initiation and management  ASSESSMENT:   Patient with PMH significant for GERD, HTN, DM, chronic R hip pain, and OSA requiring CPAP. Presents this admission with respiratory failure secondary to CHF exacerbation and noncompliance with CPAP at home.  Awake and following commands. Diuresing well with lasix. Xray confirms OG located in the stomach. Okay to start feeding per CCM. Titrate to goal.   Weight noted to remain relatively stable over the last year. Bed weight was taken this am, concern for inaccuracy per RN. Follow trends and make adjustments as needed.   Per RN, patient has nursing help at home to help him bathe in the am. Daughter typically makes patient meals. Will need to obtain further nutrition history.   UOP: 3900 ml x 24 hrs   Drips: NS @ 10 ml/hr, precedex Medications: colace, 60 mg lasix BID, miralax Labs: CBG 110-147  NUTRITION - FOCUSED PHYSICAL EXAM:  Flowsheet Row Most Recent Value  Orbital Region No depletion  Upper Arm Region No depletion  Thoracic and Lumbar Region Unable to assess  Buccal Region No depletion  Temple Region No depletion  Clavicle Bone Region No depletion  Clavicle and Acromion Bone Region No depletion  Scapular Bone Region Unable to assess  Dorsal Hand Unable to assess  [mits]  Patellar Region No depletion  Anterior Thigh Region  No depletion  Posterior Calf Region No depletion  Edema (RD Assessment) Moderate  Hair Reviewed  Eyes Unable to assess  Mouth Unable to assess  Skin Reviewed  Nails Unable to assess      Diet Order:   Diet Order             Diet NPO time specified  Diet effective now                   EDUCATION NEEDS:   Not appropriate for education at this time  Skin:  Skin Assessment: Reviewed RN Assessment  Last BM:  unknown  Height:   Ht Readings from Last 1 Encounters:  06/02/21 5\' 11"  (1.803 m)    Weight:   Wt Readings from Last 1 Encounters:  06/02/21 (!) 293.2 kg    BMI:  Body mass index is 90.15 kg/m.  Estimated Nutritional Needs:   Kcal:  2150-2350 kcal  Protein:  155-195 grams  Fluid:  >/= 2 L/day  06/04/21 MS, RD, LDN, CNSC Clinical Nutrition Pager listed in AMION

## 2021-06-02 NOTE — TOC Initial Note (Signed)
Transition of Care [facility]) - Initial/Assessment Note    Patient Details  Name: Michael Payne MRN: [id number] Date of Birth: [birth year]  Transition of Care [facility]) CM/SW Contact:    Elliot Cousin, RN Phone Number:[phone] 06/02/2021, 6:11 PM  Clinical Narrative:                  HF TOC CM spoke to pt and dtr, Angeline at bedside. Pt was able to speak with his hands and nod. He is active with [facility] RN, PT and OT. Will send message to rep, Stacie for resumption of care at dc. Pt states he wants to dc back home with HH. He has all necessary DME. Has [employer] aide that come everyday for 4 hours per day. Dtr assist him after aide leaves. States she moved to [facility] [place] from [place] to assist pt in home. She works from home. Will need HH orders RN, PT, and OT with F2F at dc.     Expected Discharge Plan: Home w Home Health Services Barriers to Discharge: Continued Medical Work up   Patient Goals and CMS Choice Patient states their goals for this hospitalization and ongoing recovery are:: wants to return home CMS Medicare.gov Compare Post Acute Care list provided to:: Patient Choice offered to / list presented to : Patient  Expected Discharge Plan and Services Expected Discharge Plan: Home w Home Health Services In-house Referral: Clinical Social Work Discharge Planning Services: CM Consult Post Acute Care Choice: Home Health Living arrangements for the past 2 months: Single Family Home                           HH Arranged: RN, PT, OT HH Agency: [facility] Date [facility] Agency Contacted: 06/02/21 Time HH Agency Contacted: 1757 Representative spoke with at [facility] Agency: Hessie Knows  Prior Living Arrangements/Services Living arrangements for the past 2 months: Single Family Home Lives with:: Spouse, Adult Children Patient language and need for interpreter reviewed:: Yes Do you feel safe going back to the place where you live?: Yes      Need for Family Participation  in Patient Care: Yes (Comment) Care giver support system in place?: Yes (comment) Current home services: Home RN, Home PT, DME, Homehealth aide (slide board, hoyer lift, trapeze, hospital bed, rolling walker, wheelchair, bedside commode, urinals, CPAP) Criminal Activity/Legal Involvement Pertinent to Current Situation/Hospitalization: No - Comment as needed  Activities of Daily Living      Permission Sought/Granted Permission sought to share information with : Case Manager, PCP, Magazine features editor Permission granted to share information with : Yes, Verbal Permission Granted  Share Information with NAME: Sumner Kirchman wife, Angeline Hoeppner daughter  Permission granted to share info w AGENCY: Home Health  Permission granted to share info w Relationship: wife, daughter  Permission granted to share info w Contact Information: [phone]  Emotional Assessment Appearance:: Appears stated age Attitude/Demeanor/Rapport: Gracious Affect (typically observed): Accepting Orientation: : Oriented to Self, Oriented to Place, Oriented to  Time, Oriented to Situation   Psych Involvement: No (comment)  Admission diagnosis:  Respiratory failure (HCC) [J96.90] CHF (congestive heart failure) (HCC) [I50.9] SOB (shortness of breath) [R06.02] Respiration abnormal [R06.9] Hypoxia [R09.02] Patient Active Problem List   Diagnosis Date Noted   Respiratory failure (HCC) 06/01/2021   Acute diastolic CHF (congestive heart failure) (HCC) 05/31/2021   CHF (congestive heart failure) (HCC) 05/31/2021   Hypoxia 05/31/2021   Gout, unspecified 03/10/2021  Edema 03/10/2021   Diabetes mellitus without complication (HCC) 03/10/2021   Muscle spasms of both lower extremities 07/29/2020   Bedbound 05/22/2020   Unilateral osteoarthritis of hip, right 12/27/2019   Severe obstructive sleep apnea 02/22/2018   Hypertriglyceridemia 08/24/2017   Low HDL (under 40) 08/24/2017   Metabolic syndrome 08/24/2017    Morbid obesity (HCC) 10/14/2013   HTN (hypertension) 10/14/2013   PCP:  Joaquim Nam, MD Pharmacy:   [facility] [phone] - [address], [place] - [address] [place] [address] Phone: [phone] Fax: [phone]  [facility] (Now [facility]) - [place], [place] - [address] [place] [address] Phone: [phone] Fax: [phone]     Social Determinants of Health (SDOH) Interventions    Readmission Risk Interventions No flowsheet data found.

## 2021-06-02 NOTE — Progress Notes (Signed)
eLink Physician-Brief Progress Note Patient Name: Michael Payne DOB: 1971-07-30 MRN: [id number]   Date of Service  06/02/2021  HPI/Events of Note  ABG reviewed.  eICU Interventions  No intervention.        Deetta Siegmann U Leeah Politano 06/02/2021, 4:20 AM

## 2021-06-02 NOTE — Chronic Care Management (AMB) (Signed)
  Care Management   Note  06/02/2021 Name: NYLAN NEVEL MRN: [id number] DOB: May 02, 1971  ARVID MARENGO is a 50 y.o. year old male who is a primary care patient of Joaquim Nam, MD and is actively engaged with the care management team. I reached out to [employer] by phone today to assist with re-scheduling a follow up visit with the RN Case Manager  Follow up plan: Unsuccessful telephone outreach attempt made. A HIPAA compliant phone message was left for the patient providing contact information and requesting a return call.   Burman Nieves, CCMA Care Guide, Embedded Care Coordination [facility]  Care Management  Direct Dial: [phone]

## 2021-06-02 NOTE — Progress Notes (Signed)
[facility] ADULT ICU REPLACEMENT PROTOCOL   The patient does apply for the [facility] Adult ICU Electrolyte Replacment Protocol based on the criteria listed below:   1.Exclusion criteria: TCTS patients, ECMO patients and Hypothermia Protocol, and   Dialysis patients 2. Is GFR >/= 30 ml/min? Yes.    Patient's GFR today is >60 3. Is SCr </= 2? Yes.   Patient's SCr is 0.91 mg/dL 4. Did SCr increase >/= 0.5 in 24 hours? No. 5.Pt's weight >40kg  Yes.   6. Abnormal electrolyte(s):  K 3.5  7. Electrolytes replaced per protocol 8.  Call MD STAT for K+ </= 2.5, Phos </= 1, or Mag </= 1 Physician:  Shawn Stall R Sagar Tengan 06/02/2021 5:04 AM

## 2021-06-02 NOTE — Progress Notes (Addendum)
NAME:  Michael Payne, MRN:  [id number], DOB:  1971/01/11, LOS: 2 ADMISSION DATE:  05/31/2021, CONSULTATION DATE: 06/01/2021 REFERRING MD: EDP, CHIEF COMPLAINT: Acute on chronic respiratory failure  History of Present Illness:  50 year old male with extensive past medical history is well-documented below who presents to [facility] 05/31/2021 with worsening respiratory failure, hypercarbia, hypoxia.  He was placed on noninvasive mechanical ventilatory support with a with a recorded PCO2 greater than 100.  After failing noninvasive interventions he was intubated by the emergency department physician on 06/01/2021 at 0 730.  Pulmonary critical care asked to assume care and admit to the intensive care unit.  Pertinent  Medical History   Past Medical History:  Diagnosis Date   Anemia    borderline anemia, was instructed to eat more iron filled foods   GERD (gastroesophageal reflux disease)    lactose intolerance   Hip pain    HTN (hypertension)    Obesity    Sleep apnea      Significant Hospital Events: Including procedures, antibiotic start and stop dates in addition to other pertinent events   06/01/2021 intubation per EDP  Interim History / Subjective:  9/14: awake and following commands. Still on increased vent settings. Multifocal pna. Starting abx and obtaining cx. Cont with diuresis as tolerated. Wife updated at bedside and would like pt placed in snf/rehab at d/c. Bed bound for 2  years 2/2 bad him that will not be replaced unless weight loss. She has been independently caring for pt along with her 2 young children at home.  19/11:21 year old morbidly obese male who is noncompliant with CPAP unable to confirm whether or not he is taking his blood pressure medication is admitted with acute hypercarbic respiratory failure requiring intubation in the emergency department  Objective   Blood pressure (!) 147/104, pulse 60, temperature 100.3 F (37.9 C), temperature source Oral,  resp. rate (!) 30, height 5\' 11"  (1.803 m), weight (!) 293.2 kg, SpO2 92 %.    Vent Mode: PRVC FiO2 (%):  [40 %-60 %] 40 % Set Rate:  [30 bmp] 30 bmp Vt Set:  [500 mL-600 mL] 500 mL PEEP:  [10 cmH20-12 cmH20] 12 cmH20 Plateau Pressure:  [27 cmH20-32 cmH20] 28 cmH20   Intake/Output Summary (Last 24 hours) at 06/02/2021 0830 Last data filed at 06/02/2021 0700 Gross per 24 hour  Intake 1371.26 ml  Output 4000 ml  Net -2628.74 ml   Filed Weights   05/31/21 1516 06/02/21 0412  Weight: (!) 222.3 kg (!) 293.2 kg    Examination: General: Morbid obese male lightly sedated on precedex HENT: No neck no JVD is appreciated Lungs: distant but diminished bilaterally Cardiovascular: Heart sounds regular regular rate rhythm Abdomen: Obese soft nontender Extremities: Bilateral lower extremity edema 2-3+. Neuro: awake follows commands BUE 3+/5 and BLE: 2/5 GU: Catheter in place  Resolved Hospital Problem list     Assessment & Plan:  Ventilator dependent respiratory failure she is multifactorial with morbid obesity of weight greater than 450 pounds along with chronic hypercarbic respiratory failure in the setting obstructive sleep apnea was noncompliant with CPAP.  Required intubation and admission to the intensive care unit.  Complicated by diastolic heart failure. Vent bundle -cont to titrate, suspect pt will need increased peep 2/2 body habitus.  Bronchodilator Aggressive diuresis -repeat echo LVEF 60-65%, normal diastolic, normal RV, moderate Ao root and ascending Ao dilatation  Morbid obesity at 490 pounds. Continue diuresis as renal function tolerates Weight loss in the future  Multifocal cap, poa:  -suspect atypical vs viral -does have numerous and variable home health support members comgin in -send rvp -send sputum -start ctx and azithro.   Diabetes mellitus CBG (last 3)  Recent Labs    06/01/21 0002  GLUCAP 110*   Sliding-scale insulin protocol     Best Practice  (right click and "Reselect all SmartList Selections" daily)   Diet/type: tubefeeds DVT prophylaxis: LMWH GI prophylaxis: PPI Lines: N/A Foley:  N/A Code Status:  full code Last date of multidisciplinary goals of care discussion [with wife and RN 06/02/21, will consult transition of care to help with dispo once pt is improved. ]  Labs   CBC: Recent Labs  Lab 05/31/21 1544 05/31/21 1916 06/01/21 0823 06/01/21 1238 06/01/21 1515 06/02/21 0307 06/02/21 0351  WBC 10.0  --   --  8.9  --  12.7*  --   NEUTROABS 7.7  --   --   --   --  10.2*  --   HGB 14.5   < > 16.0 13.6 16.0 14.1 16.3  HCT 51.3   < > 47.0 48.3 47.0 48.4 48.0  MCV 99.4  --   --  98.8  --  95.7  --   PLT 264  --   --  182  --  254  --    < > = values in this interval not displayed.    Basic Metabolic Panel: Recent Labs  Lab 05/31/21 1544 05/31/21 1916 06/01/21 0045 06/01/21 0202 06/01/21 0823 06/01/21 1238 06/01/21 1515 06/02/21 0307 06/02/21 0351  NA 138   < > 140   < > 140 140 139 139 141  K 4.6   < > 4.5   < > 4.6 3.9 3.8 3.5 3.6  CL 96*  --  95*  --   --  96*  --  96*  --   CO2 34*  --  39*  --   --  34*  --  31  --   GLUCOSE 116*  --  116*  --   --  113*  --  147*  --   BUN 6  --  6  --   --  11  --  10  --   CREATININE 0.69  --  0.80  --   --  1.02  --  0.91  --   CALCIUM 8.4*  --  8.3*  --   --  8.5*  --  8.5*  --   MG  --   --   --   --   --  1.9  --  2.1  --   PHOS  --   --   --   --   --  3.2  --  2.8  --    < > = values in this interval not displayed.   GFR: Estimated Creatinine Clearance: 225.7 mL/min (by C-G formula based on SCr of 0.91 mg/dL). Recent Labs  Lab 05/31/21 1544 06/01/21 1156 06/01/21 1238 06/02/21 0307  PROCALCITON  --   --  0.55  --   WBC 10.0  --  8.9 12.7*  LATICACIDVEN  --  2.2*  --   --     Liver Function Tests: Recent Labs  Lab 06/01/21 1238  AST 28  ALT 40  ALKPHOS 53  BILITOT 1.2  PROT 6.7  ALBUMIN 3.2*   Recent Labs  Lab 06/01/21 1238  LIPASE  22  AMYLASE 52   No results for  input(s): AMMONIA in the last 168 hours.  ABG    Component Value Date/Time   PHART 7.457 (H) 06/02/2021 0351   PCO2ART 46.8 06/02/2021 0351   PO2ART 77 (L) 06/02/2021 0351   HCO3 32.9 (H) 06/02/2021 0351   TCO2 34 (H) 06/02/2021 0351   O2SAT 95.0 06/02/2021 0351     Coagulation Profile: Recent Labs  Lab 06/01/21 1238  INR 1.1    Cardiac Enzymes: No results for input(s): CKTOTAL, CKMB, CKMBINDEX, TROPONINI in the last 168 hours.  HbA1C: Hgb A1c MFr Bld  Date/Time Value Ref Range Status  06/01/2021 12:00 PM 5.9 (H) 4.8 - 5.6 % Final    Comment:    (NOTE) Pre diabetes:          5.7%-6.4%  Diabetes:              >6.4%  Glycemic control for   <7.0% adults with diabetes   03/09/2021 12:32 PM 6.5 4.6 - 6.5 % Final    Comment:    Glycemic Control Guidelines for People with Diabetes:Non Diabetic:  <6%Goal of Therapy: <7%Additional Action Suggested:  >8%     CBG: Recent Labs  Lab 06/01/21 0002  GLUCAP 110*    Critical care time: The patient is critically ill with multiple organ systems failure and requires high complexity decision making for assessment and support, frequent evaluation and titration of therapies, application of advanced monitoring technologies and extensive interpretation of multiple databases.  Critical care time . This represents my time independent of the NPs time taking care of the pt. This is excluding procedures.    Briant Sites DO [facility] 06/02/2021, 8:31 AM See Amion for pager If no response to pager, please call [phone] until 1900 After 1900 please call ELINK [phone]

## 2021-06-02 NOTE — Progress Notes (Signed)
eLink Physician-Brief Progress Note Patient Name: TAREN TOOPS DOB: 1971/04/02 MRN: [id number]   Date of Service  06/02/2021  HPI/Events of Note  Patient started on enteral nutrition and now with hyperglycemia. Respiratory viral screen is negative.  eICU Interventions  CBG with SSI ordered, Droplet isolation discontinued.        Thomasene Lot Samantha Ragen 06/02/2021, 8:10 PM

## 2021-06-03 DIAGNOSIS — R0602 Shortness of breath: Secondary | ICD-10-CM | POA: Diagnosis not present

## 2021-06-03 LAB — POTASSIUM: Potassium: 3.3 mmol/L — ABNORMAL LOW (ref 3.5–5.1)

## 2021-06-03 LAB — BASIC METABOLIC PANEL
Anion gap: 10 (ref 5–15)
BUN: 20 mg/dL (ref 6–20)
CO2: 29 mmol/L (ref 22–32)
Calcium: 8.4 mg/dL — ABNORMAL LOW (ref 8.9–10.3)
Chloride: 103 mmol/L (ref 98–111)
Creatinine, Ser: 1.18 mg/dL (ref 0.61–1.24)
GFR, Estimated: >60 mL/min (ref >60)
Glucose, Bld: 119 mg/dL — ABNORMAL HIGH (ref 70–99)
Potassium: 3 mmol/L — ABNORMAL LOW (ref 3.5–5.1)
Sodium: 142 mmol/L (ref 135–145)

## 2021-06-03 LAB — GLUCOSE, CAPILLARY
Glucose-Capillary: 125 mg/dL — ABNORMAL HIGH (ref 70–99)
Glucose-Capillary: 112 mg/dL — ABNORMAL HIGH (ref 70–99)
Glucose-Capillary: 125 mg/dL — ABNORMAL HIGH (ref 70–99)
Glucose-Capillary: 112 mg/dL — ABNORMAL HIGH (ref 70–99)
Glucose-Capillary: 155 mg/dL — ABNORMAL HIGH (ref 70–99)
Glucose-Capillary: 111 mg/dL — ABNORMAL HIGH (ref 70–99)

## 2021-06-03 LAB — CBC
HCT: 46 % (ref 39.0–52.0)
Hemoglobin: 13.2 g/dL (ref 13.0–17.0)
MCH: 27.8 pg (ref 26.0–34.0)
MCHC: 28.7 g/dL — ABNORMAL LOW (ref 30.0–36.0)
MCV: 96.8 fL (ref 80.0–100.0)
Platelets: 256 10*3/uL (ref 150–400)
RBC: 4.75 MIL/uL (ref 4.22–5.81)
RDW: 17.2 % — ABNORMAL HIGH (ref 11.5–15.5)
WBC: 11.7 10*3/uL — ABNORMAL HIGH (ref 4.0–10.5)
nRBC: 0 % (ref 0.0–0.2)

## 2021-06-03 MED ORDER — PANTOPRAZOLE 2 MG/ML SUSPENSION
40.0000 mg | Freq: Every day | ORAL | Status: DC
  Administered 2021-06-03 – 2021-06-06 (×4): 40 mg

## 2021-06-03 MED ORDER — POTASSIUM CHLORIDE CRYS ER 20 MEQ PO TBCR: 40.0000 meq | EXTENDED_RELEASE_TABLET | ORAL | Status: DC

## 2021-06-03 MED ORDER — POTASSIUM CHLORIDE 10 MEQ/50ML IV SOLN
10.0000 meq | INTRAVENOUS | Status: AC
Start: 2021-06-03 — End: 2021-06-03
  Administered 2021-06-03 (×4): 10 meq via INTRAVENOUS
  Filled 2021-06-03 (×4): qty 50

## 2021-06-03 MED ORDER — POTASSIUM CHLORIDE 20 MEQ PO PACK
40.0000 meq | PACK | Freq: Two times a day (BID) | ORAL | Status: AC
  Administered 2021-06-03 – 2021-06-04 (×2): 40 meq
  Filled 2021-06-03 (×2): qty 2

## 2021-06-03 MED ORDER — POTASSIUM CHLORIDE 20 MEQ PO PACK
20.0000 meq | PACK | ORAL | Status: AC
Start: 2021-06-03 — End: 2021-06-03
  Administered 2021-06-03 (×2): 20 meq
  Filled 2021-06-03 (×2): qty 1

## 2021-06-03 MED ORDER — POTASSIUM CHLORIDE 10 MEQ/50ML IV SOLN
10.0000 meq | INTRAVENOUS | Status: AC
  Administered 2021-06-03 (×4): 10 meq via INTRAVENOUS
  Filled 2021-06-03 (×4): qty 50

## 2021-06-03 NOTE — Progress Notes (Signed)
NAME:  Michael Payne, MRN:  [id number], DOB:  1971/09/16, LOS: 3 ADMISSION DATE:  05/31/2021, CONSULTATION DATE: 06/01/2021 REFERRING MD: EDP, CHIEF COMPLAINT: Acute on chronic respiratory failure  History of Present Illness:  50 year old male with extensive past medical history is well-documented below who presents to [facility] 05/31/2021 with worsening respiratory failure, hypercarbia, hypoxia.  He was placed on noninvasive mechanical ventilatory support with a with a recorded PCO2 greater than 100.  After failing noninvasive interventions he was intubated by the emergency department physician on 06/01/2021 at 0 730.  Pulmonary critical care asked to assume care and admit to the intensive care unit.  Pertinent  Medical History   Past Medical History:  Diagnosis Date   Anemia    borderline anemia, was instructed to eat more iron filled foods   GERD (gastroesophageal reflux disease)    lactose intolerance   Hip pain    HTN (hypertension)    Obesity    Sleep apnea      Significant Hospital Events: Including procedures, antibiotic start and stop dates in addition to other pertinent events   06/01/2021 intubation per EDP  Interim History / Subjective:  9/15: appears comfortable on vent. Fio2 60% and 12 peep (suspect he will need 8-10). Will wean fio2 in hopes of sbt and extubation in 24hours. Discussed with him and wife he HAS TO WEAR HIS CPAP AT HOME.  9/14: awake and following commands. Still on increased vent settings. Multifocal pna. Starting abx and obtaining cx. Cont with diuresis as tolerated. Wife updated at bedside and would like pt placed in snf/rehab at d/c. Bed bound for 2  years 2/2 bad him that will not be replaced unless weight loss. She has been independently caring for pt along with her 2 young children at home.  4/83:16 year old morbidly obese male who is noncompliant with CPAP unable to confirm whether or not he is taking his blood pressure medication is admitted  with acute hypercarbic respiratory failure requiring intubation in the emergency department  Objective   Blood pressure 111/60, pulse 72, temperature 98.5 F (36.9 C), temperature source Oral, resp. rate (!) 30, height 5\' 11"  (1.803 m), weight (!) 283.2 kg, SpO2 98 %.    Vent Mode: PRVC FiO2 (%):  [40 %-60 %] 60 % Set Rate:  [30 bmp] 30 bmp Vt Set:  [500 mL] 500 mL PEEP:  [12 cmH20] 12 cmH20 Plateau Pressure:  [27 cmH20-28 cmH20] 28 cmH20   Intake/Output Summary (Last 24 hours) at 06/03/2021 0837 Last data filed at 06/03/2021 0600 Gross per 24 hour  Intake 1396.93 ml  Output 3020 ml  Net -1623.07 ml   Filed Weights   05/31/21 1516 06/02/21 0412 06/03/21 0500  Weight: (!) 222.3 kg (!) 293.2 kg (!) 283.2 kg    Examination: General: Morbid obese male lightly sedated on fentanyl HENT: No neck no JVD is appreciated Lungs: distant but diminished bilaterally Cardiovascular: Heart sounds regular regular rate rhythm Abdomen: Obese soft nontender Extremities: Bilateral lower extremity edema 2-3+. Neuro: awake follows commands BUE 4/5 and BLE: 2/5 GU: Catheter in place  Resolved Hospital Problem list     Assessment & Plan:  Ventilator dependent respiratory failure she is multifactorial with morbid obesity of weight greater than 450 pounds along with chronic hypercarbic respiratory failure in the setting obstructive sleep apnea was noncompliant with CPAP.  Required intubation and admission to the intensive care unit.  Complicated by diastolic heart failure. Vent bundle -cont to titrate, suspect pt will need  increased peep 2/2 body habitus.  -weaned fio2 to 50% and will cont to titrate for sat >90% -Bronchodilator -diuresing at this time. May hold after this afternoons dose/labs recheck -repeat echo LVEF 60-65%, normal diastolic, normal RV, moderate Ao root and ascending Ao dilatation  Morbid obesity at 490 pounds. Continue diuresis as renal function tolerates Weight loss in the  future  Multifocal cap, poa:  -suspect atypical vs viral upon ct review -does have numerous and variable home health support members comgin in - rvp negative -sputum 9/14: rare gp rods -cont ctx and azithro. (5 days)  Diabetes mellitus CBG (last 3)  Recent Labs    06/02/21 2328 06/03/21 0335 06/03/21 0750  GLUCAP 141* 125* 112*   Sliding-scale insulin protocol     Best Practice (right click and "Reselect all SmartList Selections" daily)   Diet/type: tubefeeds DVT prophylaxis: LMWH GI prophylaxis: PPI Lines: N/A Foley:  N/A Code Status:  full code Last date of multidisciplinary goals of care discussion [with wife and RN 06/03/21, consulted TOC 9/14. ]  Labs   CBC: Recent Labs  Lab 05/31/21 1544 05/31/21 1916 06/01/21 1238 06/01/21 1515 06/02/21 0307 06/02/21 0351 06/03/21 0649  WBC 10.0  --  8.9  --  12.7*  --  11.7*  NEUTROABS 7.7  --   --   --  10.2*  --   --   HGB 14.5   < > 13.6 16.0 14.1 16.3 13.2  HCT 51.3   < > 48.3 47.0 48.4 48.0 46.0  MCV 99.4  --  98.8  --  95.7  --  96.8  PLT 264  --  182  --  254  --  256   < > = values in this interval not displayed.    Basic Metabolic Panel: Recent Labs  Lab 05/31/21 1544 05/31/21 1916 06/01/21 0045 06/01/21 0202 06/01/21 1238 06/01/21 1515 06/02/21 0307 06/02/21 0351 06/03/21 0335  NA 138   < > 140   < > 140 139 139 141 142  K 4.6   < > 4.5   < > 3.9 3.8 3.5 3.6 3.0*  CL 96*  --  95*  --  96*  --  96*  --  103  CO2 34*  --  39*  --  34*  --  31  --  29  GLUCOSE 116*  --  116*  --  113*  --  147*  --  119*  BUN 6  --  6  --  11  --  10  --  20  CREATININE 0.69  --  0.80  --  1.02  --  0.91  --  1.18  CALCIUM 8.4*  --  8.3*  --  8.5*  --  8.5*  --  8.4*  MG  --   --   --   --  1.9  --  2.1  --   --   PHOS  --   --   --   --  3.2  --  2.8  --   --    < > = values in this interval not displayed.   GFR: Estimated Creatinine Clearance: 169.8 mL/min (by C-G formula based on SCr of 1.18  mg/dL). Recent Labs  Lab 05/31/21 1544 06/01/21 1156 06/01/21 1238 06/02/21 0307 06/02/21 0917 06/03/21 0649  PROCALCITON  --   --  0.55  --   --   --   WBC 10.0  --  8.9 12.7*  --  11.7*  LATICACIDVEN  --  2.2*  --   --  1.5  --     Liver Function Tests: Recent Labs  Lab 06/01/21 1238  AST 28  ALT 40  ALKPHOS 53  BILITOT 1.2  PROT 6.7  ALBUMIN 3.2*   Recent Labs  Lab 06/01/21 1238  LIPASE 22  AMYLASE 52   No results for input(s): AMMONIA in the last 168 hours.  ABG    Component Value Date/Time   PHART 7.457 (H) 06/02/2021 0351   PCO2ART 46.8 06/02/2021 0351   PO2ART 77 (L) 06/02/2021 0351   HCO3 32.9 (H) 06/02/2021 0351   TCO2 34 (H) 06/02/2021 0351   O2SAT 95.0 06/02/2021 0351     Coagulation Profile: Recent Labs  Lab 06/01/21 1238  INR 1.1    Cardiac Enzymes: No results for input(s): CKTOTAL, CKMB, CKMBINDEX, TROPONINI in the last 168 hours.  HbA1C: Hgb A1c MFr Bld  Date/Time Value Ref Range Status  06/01/2021 12:00 PM 5.9 (H) 4.8 - 5.6 % Final    Comment:    (NOTE) Pre diabetes:          5.7%-6.4%  Diabetes:              >6.4%  Glycemic control for   <7.0% adults with diabetes   03/09/2021 12:32 PM 6.5 4.6 - 6.5 % Final    Comment:    Glycemic Control Guidelines for People with Diabetes:Non Diabetic:  <6%Goal of Therapy: <7%Additional Action Suggested:  >8%     CBG: Recent Labs  Lab 06/02/21 1654 06/02/21 1959 06/02/21 2328 06/03/21 0335 06/03/21 0750  GLUCAP 113* 114* 141* 125* 112*    Critical care time: The patient is critically ill with multiple organ systems failure and requires high complexity decision making for assessment and support, frequent evaluation and titration of therapies, application of advanced monitoring technologies and extensive interpretation of multiple databases.  Critical care time . This represents my time independent of the NPs time taking care of the pt. This is excluding procedures.     Briant Sites DO [facility] 06/03/2021, 8:37 AM See Amion for pager If no response to pager, please call [phone] until 1900 After 1900 please call [facility] [phone]

## 2021-06-03 NOTE — Progress Notes (Signed)
Pharmacy Electrolyte Replacement  Recent Labs:  Recent Labs    06/02/21 0307 06/02/21 0351 06/03/21 0335 06/03/21 1421  K 3.5   < > 3.0* 3.3*  MG 2.1  --   --   --   PHOS 2.8  --   --   --   CREATININE 0.91  --  1.18  --    < > = values in this interval not displayed.    Low Critical Values (K </= 2.5, Phos </= 1, Mg </= 1) Present: none  MD Contacted: none  Potassium slightly up after aggressive replacement.   Plan: Will give 4 x 10 meq runs of potassium and 40 meq of potassium per tube every 4 hours x 2

## 2021-06-03 NOTE — Progress Notes (Signed)
RT NOTES: Patient's peak pressures in the high 40s, RR in the 40s, Sats 97%. pt does not c/o shortness of breath. Pt lavaged and suctioned for moderate thick tan secretions. Peak pressures now 30, RR 30 (which is the set rate on the vent), and sats 97%. Will continue to monitor.

## 2021-06-04 ENCOUNTER — Inpatient Hospital Stay (HOSPITAL_COMMUNITY): Payer: Medicare HMO

## 2021-06-04 DIAGNOSIS — I5033 Acute on chronic diastolic (congestive) heart failure: Secondary | ICD-10-CM | POA: Diagnosis not present

## 2021-06-04 LAB — CBC
HCT: 46.5 % (ref 39.0–52.0)
Hemoglobin: 13.3 g/dL (ref 13.0–17.0)
MCH: 28.1 pg (ref 26.0–34.0)
MCHC: 28.6 g/dL — ABNORMAL LOW (ref 30.0–36.0)
MCV: 98.1 fL (ref 80.0–100.0)
Platelets: 262 10*3/uL (ref 150–400)
RBC: 4.74 MIL/uL (ref 4.22–5.81)
RDW: 16.9 % — ABNORMAL HIGH (ref 11.5–15.5)
WBC: 9.8 10*3/uL (ref 4.0–10.5)
nRBC: 0 % (ref 0.0–0.2)

## 2021-06-04 LAB — POCT I-STAT 7, (LYTES, BLD GAS, ICA,H+H)
Acid-Base Excess: 5 mmol/L — ABNORMAL HIGH (ref 0.0–2.0)
Bicarbonate: 30.8 mmol/L — ABNORMAL HIGH (ref 20.0–28.0)
Calcium, Ion: 1.18 mmol/L (ref 1.15–1.40)
HCT: 42 % (ref 39.0–52.0)
Hemoglobin: 14.3 g/dL (ref 13.0–17.0)
O2 Saturation: 96 %
Patient temperature: 99.1
Potassium: 3.8 mmol/L (ref 3.5–5.1)
Sodium: 145 mmol/L (ref 135–145)
TCO2: 32 mmol/L (ref 22–32)
pCO2 arterial: 49.4 mmHg — ABNORMAL HIGH (ref 32.0–48.0)
pH, Arterial: 7.404 (ref 7.350–7.450)
pO2, Arterial: 84 mmHg (ref 83.0–108.0)

## 2021-06-04 LAB — BASIC METABOLIC PANEL
Anion gap: 9 (ref 5–15)
BUN: 24 mg/dL — ABNORMAL HIGH (ref 6–20)
CO2: 30 mmol/L (ref 22–32)
Calcium: 8.5 mg/dL — ABNORMAL LOW (ref 8.9–10.3)
Chloride: 104 mmol/L (ref 98–111)
Creatinine, Ser: 1.1 mg/dL (ref 0.61–1.24)
GFR, Estimated: >60 mL/min (ref >60)
Glucose, Bld: 120 mg/dL — ABNORMAL HIGH (ref 70–99)
Potassium: 3.9 mmol/L (ref 3.5–5.1)
Sodium: 143 mmol/L (ref 135–145)

## 2021-06-04 LAB — CULTURE, RESPIRATORY W GRAM STAIN: Culture: NORMAL

## 2021-06-04 LAB — GLUCOSE, CAPILLARY
Glucose-Capillary: 120 mg/dL — ABNORMAL HIGH (ref 70–99)
Glucose-Capillary: 115 mg/dL — ABNORMAL HIGH (ref 70–99)
Glucose-Capillary: 103 mg/dL — ABNORMAL HIGH (ref 70–99)
Glucose-Capillary: 131 mg/dL — ABNORMAL HIGH (ref 70–99)
Glucose-Capillary: 115 mg/dL — ABNORMAL HIGH (ref 70–99)

## 2021-06-04 MED ORDER — METOLAZONE 5 MG PO TABS
5.0000 mg | ORAL_TABLET | Freq: Once | ORAL | Status: AC
  Administered 2021-06-04: 5 mg
  Filled 2021-06-04: qty 1

## 2021-06-04 MED ORDER — AMLODIPINE BESYLATE 10 MG PO TABS
10.0000 mg | ORAL_TABLET | Freq: Every day | ORAL | Status: DC
  Administered 2021-06-06 – 2021-06-07 (×2): 10 mg
  Filled 2021-06-04 (×4): qty 1

## 2021-06-04 NOTE — Progress Notes (Signed)
Heart Failure Navigator Progress Note  Assessed for Heart & Vascular TOC clinic readiness.  Patient does not meet criteria due to admission related to A/C resp failure. Pt bedbound at baseline. Intubation continued. Would not benefit from [facility] clinic.   Navigator available for reassessment of patient.   Ozella Rocks, MSN, RN Heart Failure Nurse Navigator [phone]

## 2021-06-04 NOTE — Evaluation (Signed)
Occupational Therapy Evaluation Patient Details Name: Michael Payne MRN: [id number] DOB: 1970-12-05 Today's Date: 06/04/2021   History of Present Illness Pt is a 50 year old man admitted on 05/31/21 with shortness of breath, cough, inability to lie flat. Intubated on 06/01/21. Per chart, pt was active with HHRN, PT and OT. PMH: morbid obesity, DM2, OSA non compliant with CPAP, HTN, chronic narcotic use for LE pain, bedbound x 2 years.   Clinical Impression   Pt is bedbound at baseline, although he communicated that he does sit EOB (by writing). He can typically self feed and groom with set up and is otherwise dependent in ADL. Pt presents with LE pain, tremor and weakness in LUE (greater proximal) which is baseline, generalized weakness and decreased activity tolerance. No acute OT needs, suspect pt will be able to self feed and groom once he is extubated without OT intervention.      Recommendations for follow up therapy are one component of a multi-disciplinary discharge planning process, led by the attending physician.  Recommendations may be updated based on patient status, additional functional criteria and insurance authorization.   Follow Up Recommendations  No OT follow up;Supervision/Assistance - 24 hour (defer to next venue)    Equipment Recommendations  None recommended by OT    Recommendations for Other Services       Precautions / Restrictions Precautions Precautions: Fall Precaution Comments: ETT      Mobility Bed Mobility Overal bed mobility: Needs Assistance Bed Mobility: Rolling Rolling: Total assist         General bed mobility comments: multiple staff members assisted with rolling for skin care and to place maxislide under pt, pt able to assist with R UE on headboard to pull up in bed    Transfers                 General transfer comment: bedbound, will require lift equipment for OOB    Balance                                            ADL either performed or assessed with clinical judgement   ADL                                         General ADL Comments: pt is currently intubated and dependent, his baseline is self feeding and grooming at bed level     Vision Baseline Vision/History: 1 Wears glasses Patient Visual Report: No change from baseline       Perception     Praxis      Pertinent Vitals/Pain Pain Assessment: Faces Faces Pain Scale: Hurts even more Pain Location: R LE primarily Pain Descriptors / Indicators: Grimacing;Guarding;Discomfort Pain Intervention(s): Monitored during session;Premedicated before session;Repositioned     Hand Dominance Right   Extremity/Trunk Assessment Upper Extremity Assessment Upper Extremity Assessment: RUE deficits/detail;LUE deficits/detail RUE Deficits / Details: 5/5 strength LUE Deficits / Details: mild tremor at baseline, 3-/5 shoulder, 4/5 elbow to hand LUE Coordination: decreased gross motor   Lower Extremity Assessment Lower Extremity Assessment: Defer to PT evaluation   Cervical / Trunk Assessment Cervical / Trunk Assessment: Other exceptions Cervical / Trunk Exceptions: increased body habitus   Communication Communication Communication: Other (comment) (ETT, writes)   Cognition Arousal/Alertness: Awake/alert  Behavior During Therapy: WFL for tasks assessed/performed Overall Cognitive Status: Difficult to assess                                     General Comments       Exercises     Shoulder Instructions      Home Living Family/patient expects to be discharged to:: Private residence Living Arrangements: Children;Spouse/significant other (daughter) Available Help at Discharge: Family;Available 24 hours/day;Personal care attendant (PCS 4 hours a day) Type of Home: House Home Access: Stairs to enter [employer] of Steps: 4   Home Layout: One level               Home Equipment:  Hospital bed   Additional Comments: pt has been bedbound for years, needs a R hip replacement, but is too high risk, pt reports his sits EOB at home      Prior Functioning/Environment Level of Independence: Needs assistance  Gait / Transfers Assistance Needed: non ambulatory long term, confirms he can roll to assist with pericare/positioning ADL's / Homemaking Assistance Needed: can self feed and groom with set up, otherwise dependent            OT Problem List: Decreased strength;Decreased activity tolerance;Obesity;Pain;Impaired UE functional use;Cardiopulmonary status limiting activity      OT Treatment/Interventions:      OT Goals(Current goals can be found in the care plan section)    OT Frequency:     Barriers to D/C:            Co-evaluation              AM-PAC OT "6 Clicks" Daily Activity     Outcome Measure Help from another person eating meals?: Total Help from another person taking care of personal grooming?: Total Help from another person toileting, which includes using toliet, bedpan, or urinal?: Total Help from another person bathing (including washing, rinsing, drying)?: Total Help from another person to put on and taking off regular upper body clothing?: Total Help from another person to put on and taking off regular lower body clothing?: Total 6 Click Score: 6   End of Session Nurse Communication: Mobility status  Activity Tolerance: Patient tolerated treatment well Patient left: in bed;with call bell/phone within reach;with nursing/sitter in room  OT Visit Diagnosis: Pain;Muscle weakness (generalized) (M62.81)                Time: 0017-4944 OT Time Calculation (min): 30 min Charges:  OT General Charges $OT Visit: 1 Visit OT Evaluation $OT Eval Moderate Complexity: 1 Mod  [name], OTR/L Acute Rehabilitation Services Pager: [phone] Office: [phone]   [name] 06/04/2021, 11:21 AM

## 2021-06-04 NOTE — Progress Notes (Signed)
Attempted SBT pt failed due to SPO2 81% good waveform.  Will attempt later

## 2021-06-04 NOTE — Evaluation (Signed)
Physical Therapy Evaluation Patient Details Name: Michael Payne MRN: [id number] DOB: Mar 15, 1971 Today's Date: 06/04/2021  History of Present Illness  Pt is a 50 year old man admitted on 05/31/21 with shortness of breath, cough, inability to lie flat. Intubated on 06/01/21. Per chart, pt was active with HHRN, PT and OT. PMH: morbid obesity, DM2, OSA non compliant with CPAP, HTN, chronic narcotic use for LE pain, bedbound x 2 years.   Clinical Impression  Pt in bed upon arrival of PT, agreeable to evaluation at this time. Prior to admission the pt reports he has been bedbound for 2-3 years, but that he is able to assist with turning and transitioning to sitting EOB with assist of his family. The pt now presents with limitations in functional mobility, activity tolerance, and strength due to above dx, and will continue to benefit from skilled PT to address these deficits. The pt currently requires significantly increased assist to complete rolling in bed compared to assist of only wife and daughter at home, and was unable to transition to sitting EOB at this time. Will continue to work with the pt to establish HEP and attempt transition to sitting position acutely, but as the pt is non-ambulatory for years at baseline, I do not expect the pt to be able to achieve this during this hospitalization, and will likely need to be returned either home or to facility providing 24/7 care and supervision via PTAR.    In the long run, the pt would likely benefit from any available bariatric rehab or intensive aquatic therapy (to reduce R hip pain while working on weight loss and mobility) if and when he were able to eventually manage transfers.         Recommendations for follow up therapy are one component of a multi-disciplinary discharge planning process, led by the attending physician.  Recommendations may be updated based on patient status, additional functional criteria and insurance authorization.  Follow Up  Recommendations Supervision/Assistance - 24 hour    Equipment Recommendations   (defer to next venue, pt already has hospital bed)    Recommendations for Other Services       Precautions / Restrictions Precautions Precautions: Fall Precaution Comments: ETT Restrictions Weight Bearing Restrictions: No      Mobility  Bed Mobility Overal bed mobility: Needs Assistance Bed Mobility: Rolling Rolling: Max assist;+2 for physical assistance (+ multiple staff (3-4?))         General bed mobility comments: pt able to reach with RUE to assist with rolling with use of bed rail, but unable to complete roll with LE or core activation or assist. needing multiple staff members to complete turning of LB and turning to R    Transfers                 General transfer comment: bedbound, will require lift equipment for OOB        Pertinent Vitals/Pain Pain Assessment: Faces Faces Pain Scale: Hurts even more Pain Location: R LE primarily Pain Descriptors / Indicators: Grimacing;Guarding;Discomfort Pain Intervention(s): Limited activity within patient's tolerance;Monitored during session;Repositioned    Home Living Family/patient expects to be discharged to:: Private residence Living Arrangements: Spouse/significant other;Children (daughter) Available Help at Discharge: Family;Available 24 hours/day;Personal care attendant Type of Home: House Home Access: Stairs to enter   [employer] of Steps: 4 Home Layout: One level Home Equipment: Hospital bed Additional Comments: pt has been bedbound for 3 years, needs a R hip replacement, but is too high risk,  pt reports his sits EOB at home    Prior Function Level of Independence: Needs assistance   Gait / Transfers Assistance Needed: non ambulatory long term, confirms he can roll to assist with pericare/positioning  ADL's / Homemaking Assistance Needed: can self feed and groom with set up, otherwise dependent         Hand Dominance   Dominant Hand: Right    Extremity/Trunk Assessment   Upper Extremity Assessment Upper Extremity Assessment: Defer to OT evaluation RUE Deficits / Details: 5/5 strength LUE Deficits / Details: mild tremor at baseline, 3-/5 shoulder, 4/5 elbow to hand LUE Coordination: decreased gross motor    Lower Extremity Assessment Lower Extremity Assessment: RLE deficits/detail;LLE deficits/detail RLE Deficits / Details: more limited due to pain in hip and knee, able to move at ankle and toes, activate R quad. unable to achieve any AROM for knee flexion or at hip. rests with leg in external rotation and abduction RLE: Unable to fully assess due to pain RLE Sensation: decreased light touch (to mid calf (baseline)) LLE Deficits / Details: pt able to complete partial ROM (limited by body habitus and weakness) at ankle and knee, no AROM at hip due to weakness and body habitus. resting with LLE in ER and abduction. pt with tremors in LLE LLE Sensation: decreased light touch (to mid calf)    Cervical / Trunk Assessment Cervical / Trunk Assessment: Other exceptions Cervical / Trunk Exceptions: increased body habitus  Communication   Communication: Other (comment) (ETT, writes and head nods)  Cognition Arousal/Alertness: Awake/alert Behavior During Therapy: WFL for tasks assessed/performed Overall Cognitive Status: Difficult to assess                                        General Comments General comments (skin integrity, edema, etc.): VSS with ETT     PT Assessment Patient needs continued PT services  PT Problem List Decreased strength;Decreased range of motion;Decreased activity tolerance;Decreased balance;Decreased mobility;Pain;Obesity       PT Treatment Interventions DME instruction;Gait training;Stair training;Functional mobility training;Therapeutic activities;Therapeutic exercise;Patient/family education    PT Goals (Current goals can be found  in the Care Plan section)  Acute Rehab PT Goals Patient Stated Goal: remove ETT PT Goal Formulation: With patient Time For Goal Achievement: 06/18/21 Potential to Achieve Goals: Fair    Frequency Min 2X/week   Barriers to discharge        Co-evaluation PT/OT/SLP Co-Evaluation/Treatment: Yes Reason for Co-Treatment: Complexity of the patient's impairments (multi-system involvement);Necessary to address cognition/behavior during functional activity;For patient/therapist safety PT goals addressed during session: Mobility/safety with mobility;Strengthening/ROM         AM-PAC PT "6 Clicks" Mobility  Outcome Measure Help needed turning from your back to your side while in a flat bed without using bedrails?: Total Help needed moving from lying on your back to sitting on the side of a flat bed without using bedrails?: Total Help needed moving to and from a bed to a chair (including a wheelchair)?: Total Help needed standing up from a chair using your arms (e.g., wheelchair or bedside chair)?: Total Help needed to walk in hospital room?: Total Help needed climbing 3-5 steps with a railing? : Total 6 Click Score: 6    End of Session Equipment Utilized During Treatment: Oxygen (vent) Activity Tolerance: Patient tolerated treatment well Patient left: in bed;with call bell/phone within reach Nurse Communication: Mobility status;Need for lift  equipment PT Visit Diagnosis: Muscle weakness (generalized) (M62.81)    Time: 2876-8115 PT Time Calculation (min) (ACUTE ONLY): 30 min   Charges:   PT Evaluation $PT Eval Moderate Complexity: 1 Mod          Vickki Muff, PT, DPT   Acute Rehabilitation Department Pager #: [phone]  Ronnie Derby 06/04/2021, 12:24 PM

## 2021-06-04 NOTE — Progress Notes (Signed)
   NAME:  Michael Payne, MRN:  [id number], DOB:  1971-04-01, LOS: 4 ADMISSION DATE:  05/31/2021, CONSULTATION DATE: 06/01/2021 REFERRING MD: EDP, CHIEF COMPLAINT: Acute on chronic respiratory failure  History of Present Illness:  50 year old male with extensive past medical history is well-documented below who presents to [facility] 05/31/2021 with worsening respiratory failure, hypercarbia, hypoxia.  He was placed on noninvasive mechanical ventilatory support with a with a recorded PCO2 greater than 100.  After failing noninvasive interventions he was intubated by the emergency department physician on 06/01/2021 at 0 730.  Pulmonary critical care asked to assume care and admit to the intensive care unit.  Pertinent  Medical History   Past Medical History:  Diagnosis Date   Anemia    borderline anemia, was instructed to eat more iron filled foods   GERD (gastroesophageal reflux disease)    lactose intolerance   Hip pain    HTN (hypertension)    Obesity    Sleep apnea      Significant Hospital Events: Including procedures, antibiotic start and stop dates in addition to other pertinent events   06/01/2021 intubation per EDP  Interim History / Subjective:  No events.  Appears comfortable.  Objective   Blood pressure (!) 92/54, pulse 95, temperature 98.9 F (37.2 C), temperature source Oral, resp. rate 18, height 5\' 11"  (1.803 m), weight (!) 297.6 kg, SpO2 95 %.    Vent Mode: PRVC FiO2 (%):  [40 %-50 %] 40 % Set Rate:  [30 bmp] 30 bmp Vt Set:  [500 mL] 500 mL PEEP:  [8 cmH20-12 cmH20] 8 cmH20 Plateau Pressure:  [26 cmH20-28 cmH20] 26 cmH20   Intake/Output Summary (Last 24 hours) at 06/04/2021 0836 Last data filed at 06/04/2021 0600 Gross per 24 hour  Intake 2269.91 ml  Output 2140 ml  Net 129.91 ml    Filed Weights   06/02/21 0412 06/03/21 0500 06/04/21 0500  Weight: (!) 293.2 kg (!) 283.2 kg (!) 297.6 kg    Examination: No distress Lungs and heart sounds  distant Ext with edema vs. Adipose tissue Moves all 4 ext to command RASS 0  Net even Cr holding steady H/H steady CXR still looks wet although tough to tell with penetration  Resolved Hospital Problem list     Assessment & Plan:  Acute on chronic hypoxemic and hypercarbic respiratory failure- some combination of volume overload, CAP in patient with BMI 91 (OSA/OHS obesity related dynamic airway collapse etc). HTN, GERD, Anemia - Push diuresis, add dose of metolazone - CAP coverage - May consider extubation to BIPAP depending on how he does with SBT - Vent support, VAP prevention bundle in interim  Best Practice (right click and "Reselect all SmartList Selections" daily)   Diet/type: tubefeeds DVT prophylaxis: LMWH GI prophylaxis: PPI Lines: N/A Foley:  N/A Code Status:  full code Last date of multidisciplinary goals of care discussion [with wife and RN 06/03/21, consulted TOC 9/14. ]  34 minutes cc time 10/14 MD PCCM

## 2021-06-05 DIAGNOSIS — I5031 Acute diastolic (congestive) heart failure: Secondary | ICD-10-CM | POA: Diagnosis not present

## 2021-06-05 DIAGNOSIS — J9602 Acute respiratory failure with hypercapnia: Secondary | ICD-10-CM | POA: Diagnosis not present

## 2021-06-05 DIAGNOSIS — J9601 Acute respiratory failure with hypoxia: Secondary | ICD-10-CM | POA: Diagnosis not present

## 2021-06-05 LAB — CBC WITH DIFFERENTIAL/PLATELET
Abs Immature Granulocytes: 0.03 10*3/uL (ref 0.00–0.07)
Basophils Absolute: 0 10*3/uL (ref 0.0–0.1)
Basophils Relative: 0 %
Eosinophils Absolute: 0.4 10*3/uL (ref 0.0–0.5)
Eosinophils Relative: 4 %
HCT: 46.2 % (ref 39.0–52.0)
Hemoglobin: 13 g/dL (ref 13.0–17.0)
Immature Granulocytes: 0 %
Lymphocytes Relative: 17 %
Lymphs Abs: 1.3 10*3/uL (ref 0.7–4.0)
MCH: 27.8 pg (ref 26.0–34.0)
MCHC: 28.1 g/dL — ABNORMAL LOW (ref 30.0–36.0)
MCV: 98.7 fL (ref 80.0–100.0)
Monocytes Absolute: 0.8 10*3/uL (ref 0.1–1.0)
Monocytes Relative: 11 %
Neutro Abs: 5.3 10*3/uL (ref 1.7–7.7)
Neutrophils Relative %: 68 %
Platelets: 274 10*3/uL (ref 150–400)
RBC: 4.68 MIL/uL (ref 4.22–5.81)
RDW: 17 % — ABNORMAL HIGH (ref 11.5–15.5)
WBC: 7.9 10*3/uL (ref 4.0–10.5)
nRBC: 0 % (ref 0.0–0.2)

## 2021-06-05 LAB — BASIC METABOLIC PANEL
Anion gap: 11 (ref 5–15)
BUN: 28 mg/dL — ABNORMAL HIGH (ref 6–20)
CO2: 31 mmol/L (ref 22–32)
Calcium: 8.8 mg/dL — ABNORMAL LOW (ref 8.9–10.3)
Chloride: 104 mmol/L (ref 98–111)
Creatinine, Ser: 1.27 mg/dL — ABNORMAL HIGH (ref 0.61–1.24)
GFR, Estimated: >60 mL/min (ref >60)
Glucose, Bld: 110 mg/dL — ABNORMAL HIGH (ref 70–99)
Potassium: 3.7 mmol/L (ref 3.5–5.1)
Sodium: 146 mmol/L — ABNORMAL HIGH (ref 135–145)

## 2021-06-05 LAB — MAGNESIUM: Magnesium: 2.1 mg/dL (ref 1.7–2.4)

## 2021-06-05 LAB — GLUCOSE, CAPILLARY
Glucose-Capillary: 115 mg/dL — ABNORMAL HIGH (ref 70–99)
Glucose-Capillary: 120 mg/dL — ABNORMAL HIGH (ref 70–99)
Glucose-Capillary: 135 mg/dL — ABNORMAL HIGH (ref 70–99)
Glucose-Capillary: 139 mg/dL — ABNORMAL HIGH (ref 70–99)
Glucose-Capillary: 142 mg/dL — ABNORMAL HIGH (ref 70–99)
Glucose-Capillary: 156 mg/dL — ABNORMAL HIGH (ref 70–99)

## 2021-06-05 MED ORDER — ALBUTEROL SULFATE (2.5 MG/3ML) 0.083% IN NEBU
2.5000 mg | INHALATION_SOLUTION | RESPIRATORY_TRACT | Status: DC | PRN
  Administered 2021-06-05: 2.5 mg via RESPIRATORY_TRACT
  Filled 2021-06-05: qty 3

## 2021-06-05 MED ORDER — METOLAZONE 5 MG PO TABS
5.0000 mg | ORAL_TABLET | Freq: Once | ORAL | Status: AC
  Administered 2021-06-05: 5 mg
  Filled 2021-06-05: qty 1

## 2021-06-05 MED ORDER — FUROSEMIDE 10 MG/ML IJ SOLN
60.0000 mg | Freq: Three times a day (TID) | INTRAMUSCULAR | Status: DC
  Administered 2021-06-05 – 2021-06-11 (×18): 60 mg via INTRAVENOUS
  Filled 2021-06-05 (×19): qty 6

## 2021-06-05 MED ORDER — POTASSIUM CHLORIDE 20 MEQ PO PACK
40.0000 meq | PACK | Freq: Once | ORAL | Status: AC
  Administered 2021-06-05: 40 meq
  Filled 2021-06-05: qty 2

## 2021-06-05 MED ORDER — DEXMEDETOMIDINE HCL IN NACL 400 MCG/100ML IV SOLN
0.4000 ug/kg/h | INTRAVENOUS | Status: DC
  Administered 2021-06-05: 0.4 ug/kg/h via INTRAVENOUS
  Filled 2021-06-05 (×2): qty 100

## 2021-06-05 NOTE — Progress Notes (Signed)
NAME:  Michael Payne, MRN:  [id number], DOB:  1971/09/14, LOS: 5 ADMISSION DATE:  05/31/2021, CONSULTATION DATE: 06/01/2021 REFERRING MD: EDP, CHIEF COMPLAINT: Acute on chronic respiratory failure  History of Present Illness:  50 year old male with extensive past medical history is well-documented below who presents to [facility] 05/31/2021 with worsening respiratory failure, hypercarbia, hypoxia.  He was placed on noninvasive mechanical ventilatory support with a with a recorded PCO2 greater than 100.  After failing noninvasive interventions he was intubated by the emergency department physician on 06/01/2021 at 0 730.  Pulmonary critical care asked to assume care and admit to the intensive care unit.  Pertinent  Medical History   Past Medical History:  Diagnosis Date   Anemia    borderline anemia, was instructed to eat more iron filled foods   GERD (gastroesophageal reflux disease)    lactose intolerance   Hip pain    HTN (hypertension)    Obesity    Sleep apnea     Significant Hospital Events: Including procedures, antibiotic start and stop dates in addition to other pertinent events   06/01/2021 intubation per EDP 9/17: failed SBT with desaturations   Interim History / Subjective:   No issues overnight. Still critically ill, failed SBT with desaturations  Objective   Blood pressure 98/68, pulse (!) 101, temperature 99.1 F (37.3 C), temperature source Oral, resp. rate (!) 30, height 5\' 11"  (1.803 m), weight (!) 297.6 kg, SpO2 94 %.    Vent Mode: PRVC FiO2 (%):  [40 %] 40 % Set Rate:  [30 bmp] 30 bmp Vt Set:  [500 mL] 500 mL PEEP:  [8 cmH20] 8 cmH20 Pressure Support:  [5 cmH20] 5 cmH20 Plateau Pressure:  [24 cmH20-28 cmH20] 26 cmH20   Intake/Output Summary (Last 24 hours) at 06/05/2021 0852 Last data filed at 06/05/2021 0400 Gross per 24 hour  Intake 1427.37 ml  Output 2590 ml  Net -1162.63 ml   Filed Weights   06/02/21 0412 06/03/21 0500 06/04/21 0500   Weight: (!) 293.2 kg (!) 283.2 kg (!) 297.6 kg    Examination: General appearance: 50 y.o., male, NAD, conversant, on mechanical support, critically ill  HENT: NCAT; ett in place  Neck: trachea midline  Lungs: BL vented breaths  CV: RRR, s1 s2  Abdomen: obese, soft, nt nd  Extremities: BL LE edema  Neuro: Alert and oriented to person and place, follows commands and able to communicate via writing on pad    Resolved Hospital Problem list     Assessment & Plan:   Acute on chronic hypoxemic and hypercarbic respiratory failure- some combination of volume overload, CAP in patient with BMI 91 (OSA/OHS obesity related dynamic airway collapse etc). HTN, GERD, Anemia P: Continue diuresis  Increased lasix q8H  Redose metolazone  Cap coverage for 5 days  SBT SAT as tolerated  He continues to de-saturate with SBT Full vent support  VAP ppx  ?extubate Sunday or Monday Patient was very clear this morning via writing that he wanted to be a DNR and no trach    Best Practice (right click and "Reselect all SmartList Selections" daily)   Diet/type: tubefeeds DVT prophylaxis: LMWH GI prophylaxis: PPI Lines: N/A Foley:  N/A Code Status:  full code Last date of multidisciplinary goals of care discussion [with wife and RN 06/03/21, consulted TOC 9/14. ]    The patient wanted to talk about his code status this morning. So, being on the vent he wrote down his requests. Please see  above.   He wants to be a DNR and no tracheostomy    This patient is critically ill with multiple organ system failure; which, requires frequent high complexity decision making, assessment, support, evaluation, and titration of therapies. This was completed through the application of advanced monitoring technologies and extensive interpretation of multiple databases. During this encounter critical care time was devoted to patient care services described in this note for 33 minutes.  Josephine Igo, DO [place]  Pulmonary Critical [facility] 06/05/2021 9:01 AM

## 2021-06-05 NOTE — Progress Notes (Signed)
[facility] ADULT ICU REPLACEMENT PROTOCOL   The patient does apply for the [facility] Adult ICU Electrolyte Replacment Protocol based on the criteria listed below:   1.Exclusion criteria: TCTS patients, ECMO patients and Hypothermia Protocol, and   Dialysis patients 2. Is GFR >/= 30 ml/min? Yes.    Patient's GFR today is >60 3. Is SCr </= 2? Yes.   Patient's SCr is 1.27 mg/dL 4. Did SCr increase >/= 0.5 in 24 hours? No. 5.Pt's weight >40kg  Yes.   6. Abnormal electrolyte(s):  K 3.7  7. Electrolytes replaced per protocol 8.  Call MD STAT for K+ </= 2.5, Phos </= 1, or Mag </= 1 Physician:  M. Dinkels  Michael Payne 06/05/2021 4:17 AM

## 2021-06-05 NOTE — Plan of Care (Signed)

## 2021-06-05 NOTE — Progress Notes (Signed)
RT attempted SBT with pt this am. Pt alert and follows commands. Pt placed on CPAP/PS 12/8 40%. Pt with adequate volumes and RR at this time. SBT ended after seven minutes due to multiple desaturations to 82%. Pt placed on full support at 0845. RN aware of changes. RT will continue to monitor and be available as needed.

## 2021-06-05 NOTE — Progress Notes (Signed)
eLink Physician-Brief Progress Note Patient Name: Michael Payne DOB: [birth year] MRN: [id number]   Date of Service  06/05/2021  HPI/Events of Note  pt expressed that he wants to be DNR, no trach, meds only.  Dr Tonia Brooms did not change the order and remains listed as a full code, could you change this please.  Discussed with RN, Romeo Apple. Daughter also agrees for same.  Dr Tonia Brooms note: Has an attachment of what patient wrote in his own hand witting about his DNR, no trach request. Not depressed as per RN.    eICU Interventions  DNR and No tracheostomy request ordered.      Intervention Category Intermediate Interventions: Other:  Ranee Gosselin 06/05/2021, 9:14 PM

## 2021-06-06 DIAGNOSIS — J9602 Acute respiratory failure with hypercapnia: Secondary | ICD-10-CM | POA: Diagnosis not present

## 2021-06-06 DIAGNOSIS — I5031 Acute diastolic (congestive) heart failure: Secondary | ICD-10-CM | POA: Diagnosis not present

## 2021-06-06 DIAGNOSIS — J9601 Acute respiratory failure with hypoxia: Secondary | ICD-10-CM | POA: Diagnosis not present

## 2021-06-06 LAB — CBC WITH DIFFERENTIAL/PLATELET
Abs Immature Granulocytes: 0.02 10*3/uL (ref 0.00–0.07)
Basophils Absolute: 0 10*3/uL (ref 0.0–0.1)
Basophils Relative: 0 %
Eosinophils Absolute: 0.4 10*3/uL (ref 0.0–0.5)
Eosinophils Relative: 5 %
HCT: 46.2 % (ref 39.0–52.0)
Hemoglobin: 12.7 g/dL — ABNORMAL LOW (ref 13.0–17.0)
Immature Granulocytes: 0 %
Lymphocytes Relative: 16 %
Lymphs Abs: 1.1 10*3/uL (ref 0.7–4.0)
MCH: 27.4 pg (ref 26.0–34.0)
MCHC: 27.5 g/dL — ABNORMAL LOW (ref 30.0–36.0)
MCV: 99.8 fL (ref 80.0–100.0)
Monocytes Absolute: 0.8 10*3/uL (ref 0.1–1.0)
Monocytes Relative: 12 %
Neutro Abs: 4.5 10*3/uL (ref 1.7–7.7)
Neutrophils Relative %: 67 %
Platelets: 269 10*3/uL (ref 150–400)
RBC: 4.63 MIL/uL (ref 4.22–5.81)
RDW: 16.6 % — ABNORMAL HIGH (ref 11.5–15.5)
WBC: 6.9 10*3/uL (ref 4.0–10.5)
nRBC: 0 % (ref 0.0–0.2)

## 2021-06-06 LAB — BASIC METABOLIC PANEL
Anion gap: 11 (ref 5–15)
Anion gap: 11 (ref 5–15)
Anion gap: 10 (ref 5–15)
BUN: 33 mg/dL — ABNORMAL HIGH (ref 6–20)
BUN: 32 mg/dL — ABNORMAL HIGH (ref 6–20)
BUN: 39 mg/dL — ABNORMAL HIGH (ref 6–20)
CO2: 32 mmol/L (ref 22–32)
CO2: 32 mmol/L (ref 22–32)
CO2: 33 mmol/L — ABNORMAL HIGH (ref 22–32)
Calcium: 9 mg/dL (ref 8.9–10.3)
Calcium: 8.7 mg/dL — ABNORMAL LOW (ref 8.9–10.3)
Calcium: 9.1 mg/dL (ref 8.9–10.3)
Chloride: 103 mmol/L (ref 98–111)
Chloride: 103 mmol/L (ref 98–111)
Chloride: 104 mmol/L (ref 98–111)
Creatinine, Ser: 1.39 mg/dL — ABNORMAL HIGH (ref 0.61–1.24)
Creatinine, Ser: 1.24 mg/dL (ref 0.61–1.24)
Creatinine, Ser: 1.53 mg/dL — ABNORMAL HIGH (ref 0.61–1.24)
GFR, Estimated: >60 mL/min (ref >60)
GFR, Estimated: >60 mL/min (ref >60)
GFR, Estimated: 55 mL/min — ABNORMAL LOW (ref >60)
Glucose, Bld: 114 mg/dL — ABNORMAL HIGH (ref 70–99)
Glucose, Bld: 121 mg/dL — ABNORMAL HIGH (ref 70–99)
Glucose, Bld: 133 mg/dL — ABNORMAL HIGH (ref 70–99)
Potassium: 3.8 mmol/L (ref 3.5–5.1)
Potassium: 3.6 mmol/L (ref 3.5–5.1)
Potassium: 4 mmol/L (ref 3.5–5.1)
Sodium: 146 mmol/L — ABNORMAL HIGH (ref 135–145)
Sodium: 146 mmol/L — ABNORMAL HIGH (ref 135–145)
Sodium: 147 mmol/L — ABNORMAL HIGH (ref 135–145)

## 2021-06-06 LAB — CULTURE, BLOOD (ROUTINE X 2)
Culture: NO GROWTH
Culture: NO GROWTH

## 2021-06-06 LAB — GLUCOSE, CAPILLARY
Glucose-Capillary: 127 mg/dL — ABNORMAL HIGH (ref 70–99)
Glucose-Capillary: 121 mg/dL — ABNORMAL HIGH (ref 70–99)
Glucose-Capillary: 116 mg/dL — ABNORMAL HIGH (ref 70–99)
Glucose-Capillary: 132 mg/dL — ABNORMAL HIGH (ref 70–99)
Glucose-Capillary: 137 mg/dL — ABNORMAL HIGH (ref 70–99)
Glucose-Capillary: 121 mg/dL — ABNORMAL HIGH (ref 70–99)

## 2021-06-06 LAB — MAGNESIUM
Magnesium: 2.3 mg/dL (ref 1.7–2.4)
Magnesium: 2.2 mg/dL (ref 1.7–2.4)

## 2021-06-06 MED ORDER — METOLAZONE 5 MG PO TABS
5.0000 mg | ORAL_TABLET | Freq: Once | ORAL | Status: AC
  Administered 2021-06-06: 5 mg
  Filled 2021-06-06: qty 1

## 2021-06-06 MED ORDER — POTASSIUM CHLORIDE 20 MEQ PO PACK
40.0000 meq | PACK | Freq: Two times a day (BID) | ORAL | Status: DC
  Administered 2021-06-06 – 2021-06-07 (×2): 40 meq
  Filled 2021-06-06 (×2): qty 2

## 2021-06-06 NOTE — Procedures (Signed)
Bronchoscopy Procedure Note  Michael Payne  [id number]  [birth year]  Date:06/06/21  Time:3:03 PM   Provider Performing:Michael Payne   Procedure(s):  Flexible Bronchoscopy (62694) and Initial Therapeutic Aspiration of Tracheobronchial Tree (85462)  Indication(s) High peak airway pressures   Consent Risks of the procedure as well as the alternatives and risks of each were explained to the patient and/or caregiver.  Consent for the procedure was obtained and is signed in the bedside chart  Anesthesia Precedex, fentanyl, versed   Time Out Verified patient identification, verified procedure, site/side was marked, verified correct patient position, special equipment/implants available, medications/allergies/relevant history reviewed, required imaging and test results available.  Sterile Technique Usual hand hygiene, masks, gowns, and gloves were used  Procedure Description Bronchoscope advanced through endotracheal tube and into airway. Advancement into the airway was difficult due to the biofilm on the ETT. We pushed through this to free up the build-up in the ETT. There were membranous pieces suctioned clear. Therapeutic aspiration of the contents within the tube. Airways were examined down to subsegmental level with findings noted below with no significant findings.   Findings:  Normal distal subsegments Biofilm at the end of the ETT  Complications/Tolerance None; patient tolerated the procedure well. Chest X-ray is needed post procedure.  EBL Minimal  Specimen(s) None  Michael Nash, DO [place] [facility] 06/06/2021 3:06 PM

## 2021-06-06 NOTE — Progress Notes (Signed)
NAME:  Michael Payne, MRN:  [id number], DOB:  Sep 14, 1971, LOS: 6 ADMISSION DATE:  05/31/2021, CONSULTATION DATE: 06/01/2021 REFERRING MD: EDP, CHIEF COMPLAINT: Acute on chronic respiratory failure  History of Present Illness:  50 year old male with extensive past medical history is well-documented below who presents to [facility] 05/31/2021 with worsening respiratory failure, hypercarbia, hypoxia.  He was placed on noninvasive mechanical ventilatory support with a with a recorded PCO2 greater than 100.  After failing noninvasive interventions he was intubated by the emergency department physician on 06/01/2021 at 0 730.  Pulmonary critical care asked to assume care and admit to the intensive care unit.  Pertinent  Medical History   Past Medical History:  Diagnosis Date   Anemia    borderline anemia, was instructed to eat more iron filled foods   GERD (gastroesophageal reflux disease)    lactose intolerance   Hip pain    HTN (hypertension)    Obesity    Sleep apnea     Significant Hospital Events: Including procedures, antibiotic start and stop dates in addition to other pertinent events   06/01/2021 intubation per EDP 9/17: failed SBT with desaturations  9/18: -4 L urine output  Interim History / Subjective:   Good urine output remains on vent  Objective   Blood pressure 113/76, pulse 79, temperature 98.5 F (36.9 C), temperature source Oral, resp. rate (!) 21, height 5\' 11"  (1.803 m), weight (!) 295.7 kg, SpO2 94 %.    Vent Mode: PRVC FiO2 (%):  [40 %] 40 % Set Rate:  [30 bmp] 30 bmp Vt Set:  [500 mL] 500 mL PEEP:  [8 cmH20] 8 cmH20 Plateau Pressure:  [25 cmH20-27 cmH20] 27 cmH20   Intake/Output Summary (Last 24 hours) at 06/06/2021 06/08/2021 Last data filed at 06/06/2021 0700 Gross per 24 hour  Intake 2114.49 ml  Output 4070 ml  Net -1955.51 ml   Filed Weights   06/03/21 0500 06/04/21 0500 06/06/21 0700  Weight: (!) 283.2 kg (!) 297.6 kg (!) 295.7 kg     Examination: General appearance: 50 y.o., male, intubated on mechanical life support HENT: NCAT, endotracheal tube in place, Neck: Large neck Lungs: Bilateral mechanically ventilated breath sounds CV: Regular rate rhythm, S1-S2 Abdomen: Obese, soft, nontender nondistended Extremities: Bilateral lower extremity edema Neuro: Alert, following commands, able to communicate via writing  Resolved Hospital Problem list     Assessment & Plan:   Acute on chronic hypoxemic and hypercarbic respiratory failure- some combination of volume overload, CAP in patient with BMI 91 (OSA/OHS obesity related dynamic airway collapse etc). HTN, GERD, Anemia P: He still needs at least another day of diuresis Continue Lasix 60 mg every 8 hours Redosed metolazone again today 5 mg x 1 He had 4 L of urine output in the past 24 hours hopefully we can continue this for another day. I suspect he would have his best shot at potentially extubation tomorrow morning. He desaturated again today with spontaneous breathing trial. Remains on full vent support VAP prophylaxis Hopeful for extubation tomorrow.   Best Practice (right click and "Reselect all SmartList Selections" daily)   Diet/type: tubefeeds DVT prophylaxis: LMWH GI prophylaxis: PPI Lines: N/A Foley:  N/A Code Status:  full code Last date of multidisciplinary goals of care discussion [with wife and RN 06/03/21, consulted TOC 9/14. ]    The patient wanted to talk about his code status this morning. So, being on the vent he wrote down his requests. Please see above.  He wants to be a DNR and no tracheostomy    This patient is critically ill with multiple organ system failure; which, requires frequent high complexity decision making, assessment, support, evaluation, and titration of therapies. This was completed through the application of advanced monitoring technologies and extensive interpretation of multiple databases. During this encounter  critical care time was devoted to patient care services described in this note for 32 minutes.  Josephine Igo, DO [place] [facility] 06/06/2021 10:02 AM

## 2021-06-06 NOTE — Progress Notes (Signed)
RT assisted Dr. Tonia Brooms with bedside bronch. Pt placed on 100% FiO2 for procedure. Pt tolerated well. RT will continue to monitor.

## 2021-06-06 NOTE — Progress Notes (Signed)
PCCM:  I was called to bedside to evaluate peak pressure issues on the vent.  Decision made for bronch Got consent from family in room   Josephine Igo, DO [place] [facility] 06/06/2021 3:02 PM

## 2021-06-07 ENCOUNTER — Inpatient Hospital Stay (HOSPITAL_COMMUNITY): Payer: Medicare HMO

## 2021-06-07 DIAGNOSIS — J9601 Acute respiratory failure with hypoxia: Secondary | ICD-10-CM | POA: Diagnosis not present

## 2021-06-07 DIAGNOSIS — J9602 Acute respiratory failure with hypercapnia: Secondary | ICD-10-CM | POA: Diagnosis not present

## 2021-06-07 LAB — CBC WITH DIFFERENTIAL/PLATELET
Abs Immature Granulocytes: 0.04 10*3/uL (ref 0.00–0.07)
Basophils Absolute: 0.1 10*3/uL (ref 0.0–0.1)
Basophils Relative: 1 %
Eosinophils Absolute: 0.4 10*3/uL (ref 0.0–0.5)
Eosinophils Relative: 5 %
HCT: 49.2 % (ref 39.0–52.0)
Hemoglobin: 13.5 g/dL (ref 13.0–17.0)
Immature Granulocytes: 1 %
Lymphocytes Relative: 18 %
Lymphs Abs: 1.4 10*3/uL (ref 0.7–4.0)
MCH: 28 pg (ref 26.0–34.0)
MCHC: 27.4 g/dL — ABNORMAL LOW (ref 30.0–36.0)
MCV: 102.1 fL — ABNORMAL HIGH (ref 80.0–100.0)
Monocytes Absolute: 0.8 10*3/uL (ref 0.1–1.0)
Monocytes Relative: 10 %
Neutro Abs: 5.5 10*3/uL (ref 1.7–7.7)
Neutrophils Relative %: 65 %
Platelets: 284 10*3/uL (ref 150–400)
RBC: 4.82 MIL/uL (ref 4.22–5.81)
RDW: 16.4 % — ABNORMAL HIGH (ref 11.5–15.5)
WBC: 8.2 10*3/uL (ref 4.0–10.5)
nRBC: 0 % (ref 0.0–0.2)

## 2021-06-07 LAB — POCT I-STAT 7, (LYTES, BLD GAS, ICA,H+H)
Acid-Base Excess: 8 mmol/L — ABNORMAL HIGH (ref 0.0–2.0)
Bicarbonate: 36.3 mmol/L — ABNORMAL HIGH (ref 20.0–28.0)
Calcium, Ion: 1.22 mmol/L (ref 1.15–1.40)
HCT: 46 % (ref 39.0–52.0)
Hemoglobin: 15.6 g/dL (ref 13.0–17.0)
O2 Saturation: 99 %
Patient temperature: 98
Potassium: 4.2 mmol/L (ref 3.5–5.1)
Sodium: 146 mmol/L — ABNORMAL HIGH (ref 135–145)
TCO2: 38 mmol/L — ABNORMAL HIGH (ref 22–32)
pCO2 arterial: 65.8 mmHg (ref 32.0–48.0)
pH, Arterial: 7.347 — ABNORMAL LOW (ref 7.350–7.450)
pO2, Arterial: 154 mmHg — ABNORMAL HIGH (ref 83.0–108.0)

## 2021-06-07 LAB — GLUCOSE, CAPILLARY
Glucose-Capillary: 104 mg/dL — ABNORMAL HIGH (ref 70–99)
Glucose-Capillary: 118 mg/dL — ABNORMAL HIGH (ref 70–99)
Glucose-Capillary: 122 mg/dL — ABNORMAL HIGH (ref 70–99)
Glucose-Capillary: 123 mg/dL — ABNORMAL HIGH (ref 70–99)
Glucose-Capillary: 126 mg/dL — ABNORMAL HIGH (ref 70–99)
Glucose-Capillary: 126 mg/dL — ABNORMAL HIGH (ref 70–99)
Glucose-Capillary: 169 mg/dL — ABNORMAL HIGH (ref 70–99)

## 2021-06-07 LAB — COMPREHENSIVE METABOLIC PANEL
ALT: 53 U/L — ABNORMAL HIGH (ref 0–44)
AST: 43 U/L — ABNORMAL HIGH (ref 15–41)
Albumin: 3.1 g/dL — ABNORMAL LOW (ref 3.5–5.0)
Alkaline Phosphatase: 43 U/L (ref 38–126)
Anion gap: 12 (ref 5–15)
BUN: 39 mg/dL — ABNORMAL HIGH (ref 6–20)
CO2: 32 mmol/L (ref 22–32)
Calcium: 9.2 mg/dL (ref 8.9–10.3)
Chloride: 102 mmol/L (ref 98–111)
Creatinine, Ser: 1.51 mg/dL — ABNORMAL HIGH (ref 0.61–1.24)
GFR, Estimated: 56 mL/min — ABNORMAL LOW (ref >60)
Glucose, Bld: 138 mg/dL — ABNORMAL HIGH (ref 70–99)
Potassium: 4.3 mmol/L (ref 3.5–5.1)
Sodium: 146 mmol/L — ABNORMAL HIGH (ref 135–145)
Total Bilirubin: 0.7 mg/dL (ref 0.3–1.2)
Total Protein: 7.7 g/dL (ref 6.5–8.1)

## 2021-06-07 LAB — MAGNESIUM: Magnesium: 2.3 mg/dL (ref 1.7–2.4)

## 2021-06-07 MED ORDER — ALLOPURINOL 300 MG PO TABS
300.0000 mg | ORAL_TABLET | Freq: Two times a day (BID) | ORAL | Status: DC
  Administered 2021-06-07 – 2021-06-24 (×34): 300 mg via ORAL
  Filled 2021-06-07 (×34): qty 1

## 2021-06-07 MED ORDER — DOCUSATE SODIUM 100 MG PO CAPS
100.0000 mg | ORAL_CAPSULE | Freq: Two times a day (BID) | ORAL | Status: DC
  Administered 2021-06-07 – 2021-06-11 (×3): 100 mg via ORAL
  Filled 2021-06-07 (×7): qty 1

## 2021-06-07 MED ORDER — METOLAZONE 5 MG PO TABS
5.0000 mg | ORAL_TABLET | Freq: Once | ORAL | Status: AC
  Administered 2021-06-07: 5 mg

## 2021-06-07 MED ORDER — ADULT MULTIVITAMIN W/MINERALS CH
1.0000 | ORAL_TABLET | Freq: Every day | ORAL | Status: DC
  Administered 2021-06-07 – 2021-06-28 (×22): 1 via ORAL
  Filled 2021-06-07 (×22): qty 1

## 2021-06-07 MED ORDER — ETOMIDATE 2 MG/ML IV SOLN
INTRAVENOUS | Status: AC
  Filled 2021-06-07: qty 20

## 2021-06-07 MED ORDER — POTASSIUM CHLORIDE CRYS ER 20 MEQ PO TBCR
40.0000 meq | EXTENDED_RELEASE_TABLET | Freq: Once | ORAL | Status: AC
  Administered 2021-06-07: 40 meq via ORAL
  Filled 2021-06-07: qty 2

## 2021-06-07 MED ORDER — AMLODIPINE BESYLATE 10 MG PO TABS
10.0000 mg | ORAL_TABLET | Freq: Every day | ORAL | Status: DC
  Administered 2021-06-08 – 2021-06-16 (×9): 10 mg via ORAL
  Filled 2021-06-07 (×10): qty 1

## 2021-06-07 MED ORDER — POLYETHYLENE GLYCOL 3350 17 G PO PACK
17.0000 g | PACK | Freq: Every day | ORAL | Status: DC
  Filled 2021-06-07 (×2): qty 1

## 2021-06-07 MED ORDER — CARVEDILOL 3.125 MG PO TABS
3.1250 mg | ORAL_TABLET | Freq: Two times a day (BID) | ORAL | Status: DC
  Administered 2021-06-07 – 2021-06-28 (×41): 3.125 mg via ORAL
  Filled 2021-06-07 (×43): qty 1

## 2021-06-07 MED ORDER — ENSURE MAX PROTEIN PO LIQD
11.0000 [oz_av] | Freq: Every day | ORAL | Status: DC
  Administered 2021-06-07 – 2021-06-09 (×3): 11 [oz_av] via ORAL
  Filled 2021-06-07 (×6): qty 330

## 2021-06-07 MED ORDER — ACETAMINOPHEN 325 MG PO TABS
650.0000 mg | ORAL_TABLET | ORAL | Status: DC | PRN
  Administered 2021-06-09 – 2021-06-23 (×10): 650 mg via ORAL
  Filled 2021-06-07 (×10): qty 2

## 2021-06-07 MED ORDER — ROCURONIUM BROMIDE 10 MG/ML (PF) SYRINGE
PREFILLED_SYRINGE | INTRAVENOUS | Status: AC
  Filled 2021-06-07: qty 10

## 2021-06-07 MED ORDER — MIDAZOLAM HCL 2 MG/2ML IJ SOLN
INTRAMUSCULAR | Status: AC
  Filled 2021-06-07: qty 2

## 2021-06-07 MED ORDER — PANTOPRAZOLE SODIUM 40 MG PO TBEC: 40.0000 mg | DELAYED_RELEASE_TABLET | Freq: Every day | ORAL | Status: DC

## 2021-06-07 NOTE — Progress Notes (Signed)
eLink Physician-Brief Progress Note Patient Name: Michael Payne DOB: [birth year] MRN: [id number]   Date of Service  06/07/2021  HPI/Events of Note  nurse reported pateint was more lethargic and general feeling of unwell.   did abg and cxr. found ETT had migrated and repositioned by RT.   Repeat cxr is done. Will you take a look please  eICU Interventions  Discussed ABG and CxR with RN. Last CxR improving aeration and ET in position. ABG pao2 150. Allow hypercarbia-lung protective ventilation. Increase sedation as needed.      Intervention Category Intermediate Interventions: Diagnostic test evaluation;Respiratory distress - evaluation and management  Ranee Gosselin 06/07/2021, 4:46 AM

## 2021-06-07 NOTE — Progress Notes (Signed)
Nutrition Follow Up  DOCUMENTATION CODES:   Morbid obesity  INTERVENTION:   Ensure MAX Protein po once daily, each supplement provides 150 kcal and 30 grams of protein MVI with minerals daily  NUTRITION DIAGNOSIS:   Increased nutrient needs related to acute illness as evidenced by estimated needs.  Ongoing  GOAL:   Patient will meet greater than or equal to 90% of their needs  Diet just advanced   MONITOR:   Vent status, Labs, I & O's, Skin, TF tolerance, Weight trends  REASON FOR ASSESSMENT:   Consult, Ventilator Enteral/tube feeding initiation and management  ASSESSMENT:   Patient with PMH significant for GERD, HTN, DM, chronic R hip pain, and OSA requiring CPAP. Presents this admission with respiratory failure secondary to CHF exacerbation and noncompliance with CPAP at home.  9/19- extubated  Continue to diurese with lasix. Diet advanced to clears. RD to provide supplementation to maximize kcal and protein this admission.   Patient endorses making changes to eating habits when daughter moved in one month ago. States she meal preps their meals for the week. Meals consist of B- sausage, keto waffles, [place] sausage L- salad with chicken or steak with fruit D- yams, small amount of starch and chicken/fish. Snacks on vegetables and fruit (oranges, grapes, plums). Discussed MyPlate method and meal consistency. Patient would like to try protein shake equivalent of Premier Protein.   UOP: 3825 ml x 24 hrs Stool: 500 ml x 24 hrs   Drips: NS @ 10 ml/hr  Medications: colace, 60 mg lasix TID, SS novolog, miralax Labs: Na 146 (H)   Diet Order:   Diet Order             Diet clear liquid Room service appropriate? Yes; Fluid consistency: Thin  Diet effective now                   EDUCATION NEEDS:   Not appropriate for education at this time  Skin:  Skin Assessment: Skin Integrity Issues: Skin Integrity Issues:: Stage II Stage II: R thigh  Last BM:   9/15  Height:   Ht Readings from Last 1 Encounters:  06/02/21 5\' 11"  (1.803 m)    Weight:   Wt Readings from Last 1 Encounters:  06/07/21 (!) 297.6 kg    BMI:  Body mass index is 91.49 kg/m.  Estimated Nutritional Needs:   Kcal:  2500-2700 kcal  Protein:  125-145 grams  Fluid:  >/= 2 L/day  06/09/21 MS, RD, LDN, CNSC Clinical Nutrition Pager listed in [facility]

## 2021-06-07 NOTE — Progress Notes (Signed)
   NAME:  Michael Payne, MRN:  [id number], DOB:  [birth year], LOS: 7 ADMISSION DATE:  05/31/2021, CONSULTATION DATE: 06/01/2021 REFERRING MD: EDP, CHIEF COMPLAINT: Acute on chronic respiratory failure  History of Present Illness:  50 year old male with extensive past medical history is well-documented below who presents to [facility] 05/31/2021 with worsening respiratory failure, hypercarbia, hypoxia.  He was placed on noninvasive mechanical ventilatory support with a with a recorded PCO2 greater than 100.  After failing noninvasive interventions he was intubated by the emergency department physician on 06/01/2021 at 0 730.  Pulmonary critical care asked to assume care and admit to the intensive care unit.  Pertinent  Medical History   Past Medical History:  Diagnosis Date   Anemia    borderline anemia, was instructed to eat more iron filled foods   GERD (gastroesophageal reflux disease)    lactose intolerance   Hip pain    HTN (hypertension)    Obesity    Sleep apnea     Significant Hospital Events: Including procedures, antibiotic start and stop dates in addition to other pertinent events   06/01/2021 intubation per EDP 9/17: failed SBT with desaturations  9/18: -4 L urine output  Interim History / Subjective:  Continues to diurese well  Objective   Blood pressure (!) 91/57, pulse 83, temperature 98.6 F (37 C), temperature source Oral, resp. rate (!) 24, height 5\' 11"  (1.803 m), weight (!) 297.6 kg, SpO2 96 %.    Vent Mode: PCV FiO2 (%):  [40 %-60 %] 60 % Set Rate:  [30 bmp] 30 bmp Vt Set:  [500 mL] 500 mL PEEP:  [8 cmH20-10 cmH20] 10 cmH20 Plateau Pressure:  [23 cmH20-25 cmH20] 23 cmH20   Intake/Output Summary (Last 24 hours) at 06/07/2021 0834 Last data filed at 06/07/2021 0800 Gross per 24 hour  Intake 1784.85 ml  Output 4325 ml  Net -2540.15 ml    Filed Weights   06/04/21 0500 06/06/21 0700 06/07/21 0100  Weight: (!) 297.6 kg (!) 295.7 kg (!) 297.6 kg     Examination: No distress, following commands, writing down sentences Lungs and heart sounds diminished due to body habitus Likewise edema difficult to tease out CXR L infiltrate, overall interstitial edema  Resolved Hospital Problem list     Assessment & Plan:   Acute on chronic hypoxemic and hypercarbic respiratory failure- some combination of volume overload, CAP in patient with BMI 91 (OSA/OHS obesity related dynamic airway collapse etc). HTN, GERD, Anemia  - Continue to push diuresis - ETT is heavily coated in biofilm and contributing to difficult with ventilation; will need exchanged so might as well give him a trial of extubation to BIPAP - If he fails BIPAP, he is okay with reintubation, will change code status   Best Practice (right click and "Reselect all SmartList Selections" daily)   Diet/type: tubefeeds DVT prophylaxis: LMWH GI prophylaxis: PPI Lines: N/A Foley:  N/A Code Status:  full code Last date of multidisciplinary goals of care discussion [9/18]  34 min cc time 06/09/21 MD PCCM

## 2021-06-07 NOTE — Telephone Encounter (Signed)
Patient has been admitted to inpatient since 05/31/21. New claim will need to be done if services are still needed upon discharge.

## 2021-06-07 NOTE — Procedures (Signed)
Extubation Procedure Note  Patient Details:   Name: Michael Payne DOB: 1971/09/08 MRN: [id number]   Airway Documentation:    Vent end date: 06/07/21 Vent end time: 0920   Evaluation  O2 sats: stable throughout Complications: No apparent complications Patient did tolerate procedure well. Bilateral Breath Sounds: Diminished   Yes  Patient was extubated to a NRB without any complications, dyspnea or stridor noted. Will hold off on BIPAP for now due to copious amounts of secretions following extubation. Dr. Katrinka Blazing was made aware and is okay with using BIPAP PRN. Positive cuff leak post extubation.   Geo Slone, Margaretmary Dys 06/07/2021, 9:20 AM

## 2021-06-08 DIAGNOSIS — I5033 Acute on chronic diastolic (congestive) heart failure: Secondary | ICD-10-CM | POA: Diagnosis not present

## 2021-06-08 LAB — GLUCOSE, CAPILLARY
Glucose-Capillary: 121 mg/dL — ABNORMAL HIGH (ref 70–99)
Glucose-Capillary: 118 mg/dL — ABNORMAL HIGH (ref 70–99)
Glucose-Capillary: 112 mg/dL — ABNORMAL HIGH (ref 70–99)
Glucose-Capillary: 117 mg/dL — ABNORMAL HIGH (ref 70–99)
Glucose-Capillary: 152 mg/dL — ABNORMAL HIGH (ref 70–99)

## 2021-06-08 LAB — BASIC METABOLIC PANEL
Anion gap: 11 (ref 5–15)
BUN: 37 mg/dL — ABNORMAL HIGH (ref 6–20)
CO2: 35 mmol/L — ABNORMAL HIGH (ref 22–32)
Calcium: 9 mg/dL (ref 8.9–10.3)
Chloride: 97 mmol/L — ABNORMAL LOW (ref 98–111)
Creatinine, Ser: 1.17 mg/dL (ref 0.61–1.24)
GFR, Estimated: >60 mL/min (ref >60)
Glucose, Bld: 122 mg/dL — ABNORMAL HIGH (ref 70–99)
Potassium: 3.7 mmol/L (ref 3.5–5.1)
Sodium: 143 mmol/L (ref 135–145)

## 2021-06-08 LAB — MAGNESIUM: Magnesium: 2.2 mg/dL (ref 1.7–2.4)

## 2021-06-08 MED ORDER — POTASSIUM CHLORIDE CRYS ER 20 MEQ PO TBCR
40.0000 meq | EXTENDED_RELEASE_TABLET | Freq: Once | ORAL | Status: AC
  Administered 2021-06-08: 40 meq via ORAL
  Filled 2021-06-08: qty 2

## 2021-06-08 MED ORDER — ACETAMINOPHEN 325 MG PO TABS
650.0000 mg | ORAL_TABLET | Freq: Every day | ORAL | Status: DC
  Administered 2021-06-08 – 2021-06-23 (×15): 650 mg via ORAL
  Filled 2021-06-08 (×15): qty 2

## 2021-06-08 NOTE — Plan of Care (Signed)

## 2021-06-08 NOTE — Plan of Care (Signed)
  Problem: Nutrition: Goal: Adequate nutrition will be maintained Outcome: Progressing   Problem: Elimination: Goal: Will not experience complications related to bowel motility Outcome: Progressing   Problem: Skin Integrity: Goal: Risk for impaired skin integrity will decrease Outcome: Progressing   Problem: Activity: Goal: Ability to tolerate increased activity will improve Outcome: Progressing   Problem: Respiratory: Goal: Ability to maintain a clear airway and adequate ventilation will improve Outcome: Progressing   Problem: Role Relationship: Goal: Method of communication will improve Outcome: Progressing

## 2021-06-08 NOTE — Progress Notes (Signed)
Patient only tolerated BiPAP for 15 minutes before asking to take it off. Patient stated it made him feel nauseous. Placed patient back on 8 Lpm HFNC. Vitals stable. RN made aware.

## 2021-06-08 NOTE — Progress Notes (Signed)
PT Cancellation Note  Patient Details Name: CHAIS FEHRINGER MRN: [id number] DOB: [birth year]   Cancelled Treatment:    Reason Eval/Treat Not Completed: Other (comment). Earlier pt was transferring to different unit and now eating dinner. Will check back next date.    Angelina Ok [facility] 06/08/2021, 4:45 PM Skip Mayer PT Acute Rehabilitation Services Pager [phone] Office [phone]

## 2021-06-08 NOTE — Progress Notes (Signed)
   NAME:  Michael Payne, MRN:  [id number], DOB:  01-21-1971, LOS: 8 ADMISSION DATE:  05/31/2021, CONSULTATION DATE: 06/01/2021 REFERRING MD: EDP, CHIEF COMPLAINT: Acute on chronic respiratory failure  History of Present Illness:  50 year old male with extensive past medical history is well-documented below who presents to [facility] 05/31/2021 with worsening respiratory failure, hypercarbia, hypoxia.  He was placed on noninvasive mechanical ventilatory support with a with a recorded PCO2 greater than 100.  After failing noninvasive interventions he was intubated by the emergency department physician on 06/01/2021 at 0 730.  Pulmonary critical care asked to assume care and admit to the intensive care unit.  Pertinent  Medical History   Past Medical History:  Diagnosis Date   Anemia    borderline anemia, was instructed to eat more iron filled foods   GERD (gastroesophageal reflux disease)    lactose intolerance   Hip pain    HTN (hypertension)    Obesity    Sleep apnea     Significant Hospital Events: Including procedures, antibiotic start and stop dates in addition to other pertinent events   06/01/2021 intubation per EDP 9/17: failed SBT with desaturations  9/18: -4 L urine output 9/19 extubated  Interim History / Subjective:  Continues to diurese well C/o waking up shivering in middle of night using PAP; this was apparently an issue at home too  Objective   Blood pressure 121/77, pulse 88, temperature 97.9 F (36.6 C), temperature source Oral, resp. rate 17, height 5\' 11"  (1.803 m), weight (!) 271.7 kg, SpO2 94 %. CVP:  [2 mmHg] 2 mmHg  Vent Mode: BIPAP;PCV FiO2 (%):  [50 %-100 %] 50 % Set Rate:  [12 bmp] 12 bmp PEEP:  [6 cmH20] 6 cmH20   Intake/Output Summary (Last 24 hours) at 06/08/2021 0829 Last data filed at 06/08/2021 0600 Gross per 24 hour  Intake 1086.41 ml  Output 6050 ml  Net -4963.59 ml    Filed Weights   06/06/21 0700 06/07/21 0100 06/08/21 0500   Weight: (!) 295.7 kg (!) 297.6 kg (!) 271.7 kg    Examination: No distress laying in bed Lung/heart sounds distant Ext with equivocal edema Moves all 4 ext to command RASS 0, anxious  Resolved Hospital Problem list     Assessment & Plan:   Acute on chronic hypoxemic and hypercarbic respiratory failure- some combination of volume overload, CAP in patient with BMI 91 (OSA/OHS obesity related dynamic airway collapse etc). HTN, GERD, Anemia  - Continue to push diuresis, mobility as able - Continue BIPAP qHS - Trial of qHS tylenol to reduce "shivering" sensation on waking at night; this has been an issue at home, spoke with my sleep medicine partners: they have never heard of this.  He has tried increasing the ambient temp.  He has tried humidity on his CPAP.  He has tried different interfaces all without success. Only thing I could find online was blogs with a suggestion to try checking sugars when this happens to r/o night-time hypo- or hyper-glycemia being a cause.  I have asked nurse to sign this out - PTA pain regimen as ordered - Stable for transfer to progressive, appreciate TRH taking over care starting 9/21  Best Practice (right click and "Reselect all SmartList Selections" daily)   Diet/type: cardiac DVT prophylaxis: LMWH GI prophylaxis: off Code Status:  full code Last date of multidisciplinary goals of care discussion [9/18]  10/21 MD PCCM

## 2021-06-09 DIAGNOSIS — L8990 Pressure ulcer of unspecified site, unspecified stage: Secondary | ICD-10-CM | POA: Insufficient documentation

## 2021-06-09 DIAGNOSIS — I5033 Acute on chronic diastolic (congestive) heart failure: Secondary | ICD-10-CM | POA: Diagnosis not present

## 2021-06-09 DIAGNOSIS — R0902 Hypoxemia: Secondary | ICD-10-CM | POA: Diagnosis not present

## 2021-06-09 DIAGNOSIS — L8942 Pressure ulcer of contiguous site of back, buttock and hip, stage 2: Secondary | ICD-10-CM | POA: Diagnosis not present

## 2021-06-09 DIAGNOSIS — J9601 Acute respiratory failure with hypoxia: Secondary | ICD-10-CM | POA: Diagnosis not present

## 2021-06-09 LAB — BASIC METABOLIC PANEL
Anion gap: 8 (ref 5–15)
BUN: 38 mg/dL — ABNORMAL HIGH (ref 6–20)
CO2: 39 mmol/L — ABNORMAL HIGH (ref 22–32)
Calcium: 9.1 mg/dL (ref 8.9–10.3)
Chloride: 92 mmol/L — ABNORMAL LOW (ref 98–111)
Creatinine, Ser: 1.4 mg/dL — ABNORMAL HIGH (ref 0.61–1.24)
GFR, Estimated: >60 mL/min (ref >60)
Glucose, Bld: 138 mg/dL — ABNORMAL HIGH (ref 70–99)
Potassium: 4.7 mmol/L (ref 3.5–5.1)
Sodium: 139 mmol/L (ref 135–145)

## 2021-06-09 LAB — GLUCOSE, CAPILLARY
Glucose-Capillary: 125 mg/dL — ABNORMAL HIGH (ref 70–99)
Glucose-Capillary: 140 mg/dL — ABNORMAL HIGH (ref 70–99)
Glucose-Capillary: 147 mg/dL — ABNORMAL HIGH (ref 70–99)
Glucose-Capillary: 155 mg/dL — ABNORMAL HIGH (ref 70–99)
Glucose-Capillary: 169 mg/dL — ABNORMAL HIGH (ref 70–99)
Glucose-Capillary: 170 mg/dL — ABNORMAL HIGH (ref 70–99)

## 2021-06-09 LAB — MAGNESIUM: Magnesium: 2.3 mg/dL (ref 1.7–2.4)

## 2021-06-09 NOTE — TOC Initial Note (Signed)
Transition of Care [facility]) - Initial/Assessment Note    Patient Details  Name: Michael Payne MRN: [id number] Date of Birth: 10/15/1970  Transition of Care [facility]) CM/SW Contact:    Tresa Endo Phone Number: 06/09/2021, 1:18 PM  Clinical Narrative:                 CSW received SNF consult. CSW met with pt via phone. CSW introduced self and explained role at the hospital. Pt reports that PTA the pt lived at home with his spouse and daughter . PT reports pt ADL's / Homemaking Assistance Needed; can self feed and groom with set up, otherwise dependent. PT reports pt has 24 hours family support at home from his wife and daughter. Pt needs 3-4 staff members to turn pt.  CSW reviewed PT/OT recommendations for SNF. Pt reports being agreeable to go to SNF if he is able to get into a facility. Pt gave CSW permission to fax out to facilities in the area. Pt has no preference of facility at this time. CSW gave pt medicare.gov rating list to review.  CSW will continue to follow.    Expected Discharge Plan: Skilled Nursing Facility Barriers to Discharge: Continued Medical Work up   Patient Goals and CMS Choice Patient states their goals for this hospitalization and ongoing recovery are:: Would like to return home CMS Medicare.gov Compare Post Acute Care list provided to:: Patient Choice offered to / list presented to : Patient  Expected Discharge Plan and Services Expected Discharge Plan: [place] In-house Referral: Clinical Social Work Discharge Planning Services: CM Consult Post Acute Care Choice: [place] arrangements for the past 2 months: [place] Arranged: RN, PT, OT [facility] Agency: [place] Date [place]: 06/02/21 Time [place]: [id number] Representative spoke with at [place]: Freddi Starr  Prior Living Arrangements/Services Living arrangements for the past 2  months: Single Family Home Lives with:: Self, Adult Children Patient language and need for interpreter reviewed:: Yes Do you feel safe going back to the place where you live?: Yes      Need for Family Participation in Patient Care: Yes (Comment) Care giver support system in place?: Yes (comment) Current home services: Home RN, Home PT, DME, Homehealth aide (slide board, hoyer lift, trapeze, hospital bed, rolling walker, wheelchair, bedside commode, urinals, CPAP) Criminal Activity/Legal Involvement Pertinent to Current Situation/Hospitalization: No - Comment as needed  Activities of Daily Living Home Assistive Devices/Equipment: Trapeze ADL Screening (condition at time of admission) Patient's cognitive ability adequate to safely complete daily activities?: No Is the patient deaf or have difficulty hearing?: No Does the patient have difficulty seeing, even when wearing glasses/contacts?: No Does the patient have difficulty concentrating, remembering, or making decisions?: No Patient able to express need for assistance with ADLs?: Yes Does the patient have difficulty dressing or bathing?: Yes Independently performs ADLs?: No Communication: Independent Dressing (OT): Dependent Grooming: Dependent Feeding: Needs assistance Is this a change from baseline?: Pre-admission baseline Bathing: Dependent Toileting: Dependent In/Out Bed: Dependent Walks in Home: Dependent Does the patient have difficulty walking or climbing stairs?: Yes Weakness of Legs: Right Weakness of Arms/Hands: None  Permission Sought/Granted Permission sought to share information with : Family Supports, Chartered certified accountant granted to share information with : Yes, Verbal Permission Granted  Share  Information with NAME: Tanyon Alipio wife, Tallis Soledad daughter  Permission granted to share info w AGENCY: SNF  Permission granted to share info w Relationship: wife, daughter  Permission granted to  share info w Contact Information: [phone]  Emotional Assessment Appearance:: Appears stated age Attitude/Demeanor/Rapport: Unable to Assess Affect (typically observed): Unable to Assess Orientation: : Oriented to Self, Oriented to Place, Oriented to  Time, Oriented to Situation Alcohol / Substance Use: Not Applicable Psych Involvement: No (comment)  Admission diagnosis:  Respiratory failure ([place]) [J96.90] CHF (congestive heart failure) (HCC) [I50.9] SOB (shortness of breath) [R06.02] Respiration abnormal [R06.9] Hypoxia [R09.02] Patient Active Problem List   Diagnosis Date Noted   Respiratory failure ([place]) 94/76/5465   Acute diastolic CHF (congestive heart failure) ([place]) 05/31/2021   CHF (congestive heart failure) ([place]) 05/31/2021   Hypoxia 05/31/2021   Gout, unspecified 03/10/2021   Edema 03/10/2021   Diabetes mellitus without complication ([place]) 03/54/6568   Muscle spasms of both lower extremities 07/29/2020   Bedbound 05/22/2020   Unilateral osteoarthritis of hip, right 12/27/2019   Severe obstructive sleep apnea 02/22/2018   Hypertriglyceridemia 08/24/2017   Low HDL (under 40) 12/75/1700   Metabolic syndrome 17/49/4496   Morbid obesity ([place]) 10/14/2013   HTN (hypertension) 10/14/2013   PCP:  [name], MD Pharmacy:   [id number] [id number]: [phone]: [phone] [facility] [place] [address]Phone: [phone]: [phone]    Social Determinants of Health (SDOH) Interventions    Readmission Risk Interventions No flowsheet data found.

## 2021-06-09 NOTE — Care Management Important Message (Signed)
Important Message  Patient Details  Name: JUDY GOODENOW MRN: [id number] Date of Birth: [birth year]   Medicare Important Message Given:  Yes     Analeese Andreatta 06/09/2021, 11:37 AM

## 2021-06-09 NOTE — Progress Notes (Signed)
Pt refused bipap. RT explained to pt the importance of wearing bipap but pt still refused.

## 2021-06-09 NOTE — NC FL2 (Signed)
[place] MEDICAID FL2 LEVEL OF CARE SCREENING TOOL     IDENTIFICATION  Patient Name: Michael Payne Birthdate: 1971-02-14 Sex: male Admission Date (Current Location): 05/31/2021  [facility] and [place] Number:  Producer, television/film/video and Address:  [facility], [address] [place], [place] [address]      Provider Number: [id number]  Attending Physician Name and Address:  Coralie Keens,*  Relative Name and Phone Number:  Duff,Nicole (Spouse)   [phone]    Current Level of Care: Hospital Recommended Level of Care: Skilled Nursing Facility Prior Approval Number:    Date Approved/Denied:   PASRR Number: [id number]  Discharge Plan: SNF    Current Diagnoses: Patient Active Problem List   Diagnosis Date Noted   Respiratory failure (HCC) 06/01/2021   Acute diastolic CHF (congestive heart failure) (HCC) 05/31/2021   CHF (congestive heart failure) (HCC) 05/31/2021   Hypoxia 05/31/2021   Gout, unspecified 03/10/2021   Edema 03/10/2021   Diabetes mellitus without complication (HCC) 03/10/2021   Muscle spasms of both lower extremities 07/29/2020   Bedbound 05/22/2020   Unilateral osteoarthritis of hip, right 12/27/2019   Severe obstructive sleep apnea 02/22/2018   Hypertriglyceridemia 08/24/2017   Low HDL (under 40) 08/24/2017   Metabolic syndrome 08/24/2017   Morbid obesity (HCC) 10/14/2013   HTN (hypertension) 10/14/2013    Orientation RESPIRATION BLADDER Height & Weight     Self, Time, Situation, Place  Vent Continent, Indwelling catheter Weight: (!) 599 lb (271.7 kg) Height:  5\' 11"  (180.3 cm)  BEHAVIORAL SYMPTOMS/MOOD NEUROLOGICAL BOWEL NUTRITION STATUS        Feeding tube  AMBULATORY STATUS COMMUNICATION OF NEEDS Skin   Total Care Verbally Skin abrasions (pressure injury)                       Personal Care Assistance Level of Assistance  Bathing, Feeding, Dressing Bathing Assistance: Limited assistance Feeding  assistance: Limited assistance Dressing Assistance: Limited assistance     Functional Limitations Info  Sight, Hearing, Speech Sight Info: Adequate Hearing Info: Adequate Speech Info: Adequate    SPECIAL CARE FACTORS FREQUENCY  PT (By licensed PT), OT (By licensed OT)     PT Frequency: 5x/week OT Frequency: 5x/week            Contractures Contractures Info: Not present    Additional Factors Info  Code Status, Allergies, Insulin Sliding Scale Code Status Info: Full Allergies Info: Aspirin, Lactose Intolerance (Gi), Lisinopril   Insulin Sliding Scale Info: Please see medication list       Current Medications (06/09/2021):  This is the current hospital active medication list Current Facility-Administered Medications  Medication Dose Route Frequency Provider Last Rate Last Admin   0.9 %  sodium chloride infusion   Intravenous Continuous Minor, 06/11/2021, NP 10 mL/hr at 06/08/21 0500 Infusion Verify at 06/08/21 0500   acetaminophen (TYLENOL) suppository 325 mg  325 mg Rectal Q4H PRN 06/10/21, RPH       acetaminophen (TYLENOL) tablet 650 mg  650 mg Oral Q4H PRN Mosetta Anis, MD       acetaminophen (TYLENOL) tablet 650 mg  650 mg Oral QHS Lorin Glass, MD   650 mg at 06/08/21 2202   albuterol (PROVENTIL) (2.5 MG/3ML) 0.083% nebulizer solution 2.5 mg  2.5 mg Nebulization Q2H PRN Icard, Bradley L, DO   2.5 mg at 06/05/21 1517   allopurinol (ZYLOPRIM) tablet 300 mg  300 mg Oral BID  Lorin Glass, MD   300 mg at 06/09/21 1037   amLODipine (NORVASC) tablet 10 mg  10 mg Oral Daily Lorin Glass, MD   10 mg at 06/09/21 1037   carvedilol (COREG) tablet 3.125 mg  3.125 mg Oral BID WC Lorin Glass, MD   3.125 mg at 06/09/21 1037   Chlorhexidine Gluconate Cloth 2 % PADS 6 each  6 each Topical Daily Cheri Fowler, MD   6 each at 06/09/21 0917   docusate sodium (COLACE) capsule 100 mg  100 mg Oral BID PRN Minor, Vilinda Blanks, NP       docusate sodium (COLACE) capsule 100  mg  100 mg Oral BID Lorin Glass, MD   100 mg at 06/07/21 2123   enoxaparin (LOVENOX) injection 100 mg  100 mg Subcutaneous Q24H Briant Sites, DO   100 mg at 06/07/21 2114   furosemide (LASIX) injection 60 mg  60 mg Intravenous Q8H Icard, Bradley L, DO   60 mg at 06/09/21 3546   hydrALAZINE (APRESOLINE) injection 5-10 mg  5-10 mg Intravenous Q6H PRN Mosetta Anis, RPH   5 mg at 06/02/21 0205   hydrALAZINE (APRESOLINE) tablet 25 mg  25 mg Oral Q6H PRN Mikey College T, MD       insulin aspart (novoLOG) injection 2-6 Units  2-6 Units Subcutaneous Q4H Migdalia Dk, MD   4 Units at 06/09/21 1155   ipratropium-albuterol (DUONEB) 0.5-2.5 (3) MG/3ML nebulizer solution 3 mL  3 mL Nebulization Q6H Mikey College T, MD   3 mL at 06/09/21 0717   multivitamin with minerals tablet 1 tablet  1 tablet Oral Daily Lorin Glass, MD   1 tablet at 06/09/21 1037   ondansetron (ZOFRAN) injection 4 mg  4 mg Intravenous Q6H PRN Mikey College T, MD       oxyCODONE-acetaminophen (PERCOCET/ROXICET) 5-325 MG per tablet 1 tablet  1 tablet Oral Q6H PRN Emeline General, MD   1 tablet at 05/31/21 2042   And   oxyCODONE (Oxy IR/ROXICODONE) immediate release tablet 5 mg  5 mg Oral Q6H PRN Mikey College T, MD   5 mg at 05/31/21 2042   polyethylene glycol (MIRALAX / GLYCOLAX) packet 17 g  17 g Oral Daily PRN Minor, Vilinda Blanks, NP       polyethylene glycol (MIRALAX / GLYCOLAX) packet 17 g  17 g Oral Daily Lorin Glass, MD       protein supplement (ENSURE MAX) liquid  11 oz Oral Daily Lorin Glass, MD   11 oz at 06/09/21 1146   sodium chloride flush (NS) 0.9 % injection 10-40 mL  10-40 mL Intracatheter Q12H Briant Sites, DO   10 mL at 06/09/21 1146   sodium chloride flush (NS) 0.9 % injection 10-40 mL  10-40 mL Intracatheter PRN Briant Sites, DO       tiZANidine (ZANAFLEX) tablet 4 mg  4 mg Oral Q8H PRN Emeline General, MD         Discharge Medications: Please see discharge summary for a list of discharge  medications.  Relevant Imaging Results:  Relevant Lab Results:   Additional Information SSN# [id number] uses CPAP at night wt 297.6kg ht 5' 11'' NO COVID-19 Vaccine  Ivette Loyal, LCSWA

## 2021-06-09 NOTE — Progress Notes (Signed)
Unable to get a bed weight on patient at this time on sizewise bed.

## 2021-06-09 NOTE — Progress Notes (Signed)
PROGRESS NOTE    Michael Payne  PJK:[id number] DOB: 04/15/1971 DOA: 05/31/2021 PCP: Joaquim Nam, MD    Brief Narrative:  [name] was admitted to the hospital with the working diagnosis of acute hypoxemic/hypercapnic respiratory failure due to heart failure exacerbation.  50 year old male past medical history for obesity class III, obstructive sleep apnea, hypertension, and ambulatory dysfunction, who presented with dyspnea.  Patient is bedbound, complained of feeling weak for several weeks, positive productive cough, positive orthopnea and rapidly progressing dyspnea.  On his initial physical examination blood pressure 164/101, heart rate 104, respirate 20, oxygen saturation 86%, he had scattered wheezing and rales bilaterally, increased work of breathing, heart S1-S2, present, rhythmic, abdomen protuberant, soft nontender, positive lower extremity edema.  Venous blood gas with a pH of 7.23, PCO2 98.1, PO2 78.6, sodium 138, potassium 4.6, chloride 96, bicarb 34, glucose 116, BUN 6, creatinine 0.69.  White count 10.0, hemoglobin 14.5, hematocrit 51.3, platelets 264. SARS COVID-19 negative.  Urinalysis [place] gravity 1.006. Toxicology negative.  Chest radiograph with significant bilateral ischial infiltrates. CT chest with multifocal airspace disease bilateral lower lobes and right upper lobe. Moderate cardiomegaly.  EKG 94 bpm, left axis deviation, normal intervals, sinus rhythm, no significant ST segment or T wave changes.  Patient was placed on noninvasive mechanical ventilation, follow-up PCO2 greater than 100, after failing noninvasive mechanical ventilation he was intubated and admitted to the intensive care unit.  Assessment & Plan:   Active Problems:   Acute diastolic CHF (congestive heart failure) (HCC)   CHF (congestive heart failure) (HCC)   Hypoxia   Respiratory failure (HCC)   Pressure injury of skin   Acute hypoxemic and hypercapnic respiratory failure, OHV,  Obesity class 3. Volume overload acute diastolic heart failure decompensation.  Patient with improvement in dyspnea but not yet back to baseline, continue using 8 L/min per [place] Has difficulty using bipap at night.  He has been non ambulatory for the last 7 years, due to knee and hip arthritis.   Echocardiogram with preserved LV systolic function.   Continue with furosemide 60 mg IV q 8 hrs, urine output over last 24 hrs 2,650 ml  2. HTN. Continue blood pressure control with amlodipine and carvedilol.   3. T2DM. Continue glucose cover and monitoring with insulin sliding scale.   4. Right posterior thigh pressure ulcer stage 2. Not clear if present on admission.   Patient continue to be at high risk for worsening respiratory failure   Status is: Inpatient  Remains inpatient appropriate because:Inpatient level of care appropriate due to severity of illness  Dispo: The patient is from: Home              Anticipated d/c is to: Home              Patient currently is not medically stable to d/c.   Difficult to place patient No  DVT prophylaxis: Enoxaparin   Code Status:    full  Family Communication:   No family at the bedside      Nutrition Status: Nutrition Problem: Increased nutrient needs Etiology: acute illness Signs/Symptoms: estimated needs Interventions: Tube feeding     Skin Documentation: Pressure Injury 06/04/21 Thigh Right;Posterior Stage 2 -  Partial thickness loss of dermis presenting as a shallow open injury with a red, pink wound bed without slough. Intact blister 1.5 cmX 1.5 cm (Active)  06/04/21 1000  Location: Thigh  Location Orientation: Right;Posterior  Staging: Stage 2 -  Partial thickness loss of dermis  presenting as a shallow open injury with a red, pink wound bed without slough.  Wound Description (Comments): Intact blister 1.5 cmX 1.5 cm  Present on Admission:      Subjective: Patient with no nausea or vomiting, continue to have dyspnea and has  not been able to use bipap due to claustrophobia   Objective: Vitals:   06/09/21 0743 06/09/21 1109 06/09/21 1200 06/09/21 1356  BP: 112/79 109/65 128/86 128/86  Pulse: 91 82 90 80  Resp: 14 16 17 16   Temp:  98 F (36.7 C)    TempSrc:      SpO2:  94% 94% 94%  Weight:      Height:        Intake/Output Summary (Last 24 hours) at 06/09/2021 1611 Last data filed at 06/09/2021 1200 Gross per 24 hour  Intake 1575 ml  Output 3200 ml  Net -1625 ml   Filed Weights   06/06/21 0700 06/07/21 0100 06/08/21 0500  Weight: (!) 295.7 kg (!) 297.6 kg (!) 271.7 kg    Examination:   General: Not in pain or dyspnea, deconditioned  Neurology: Awake and alert, non focal  E ENT: mild pallor, no icterus, oral mucosa moist Cardiovascular: No JVD. S1-S2 present, rhythmic, no gallops, rubs, or murmurs. No lower extremity edema. Pulmonary:  positive breath sounds bilaterally, with no wheezing, rhonchi , scattered rales. Gastrointestinal. Abdomen protuberant. Non tender Skin. No rashes Musculoskeletal: no joint deformities     Data Reviewed: I have personally reviewed following labs and imaging studies  CBC: Recent Labs  Lab 06/03/21 0649 06/04/21 0320 06/04/21 0341 06/05/21 0300 06/06/21 0219 06/07/21 0408 06/07/21 0446  WBC 11.7* 9.8  --  7.9 6.9  --  8.2  NEUTROABS  --   --   --  5.3 4.5  --  5.5  HGB 13.2 13.3 14.3 13.0 12.7* 15.6 13.5  HCT 46.0 46.5 42.0 46.2 46.2 46.0 49.2  MCV 96.8 98.1  --  98.7 99.8  --  102.1*  PLT 256 262  --  274 269  --  284   Basic Metabolic Panel: Recent Labs  Lab 06/06/21 0518 06/06/21 1230 06/06/21 2051 06/07/21 0408 06/07/21 0446 06/08/21 0439 06/09/21 0500  NA  --  146* 147* 146* 146* 143 139  K  --  3.6 4.0 4.2 4.3 3.7 4.7  CL  --  103 104  --  102 97* 92*  CO2  --  32 33*  --  32 35* 39*  GLUCOSE  --  121* 133*  --  138* 122* 138*  BUN  --  32* 39*  --  39* 37* 38*  CREATININE  --  1.24 1.53*  --  1.51* 1.17 1.40*  CALCIUM  --  8.7*  9.1  --  9.2 9.0 9.1  MG 2.3 2.2  --   --  2.3 2.2 2.3   GFR: Estimated Creatinine Clearance: 138.9 mL/min (A) (by C-G formula based on SCr of 1.4 mg/dL (H)). Liver Function Tests: Recent Labs  Lab 06/07/21 0446  AST 43*  ALT 53*  ALKPHOS 43  BILITOT 0.7  PROT 7.7  ALBUMIN 3.1*   No results for input(s): LIPASE, AMYLASE in the last 168 hours. No results for input(s): AMMONIA in the last 168 hours. Coagulation Profile: No results for input(s): INR, PROTIME in the last 168 hours. Cardiac Enzymes: No results for input(s): CKTOTAL, CKMB, CKMBINDEX, TROPONINI in the last 168 hours. BNP (last 3 results) Recent Labs    03/09/21  1232  PROBNP 23.0   HbA1C: No results for input(s): HGBA1C in the last 72 hours. CBG: Recent Labs  Lab 06/09/21 0005 06/09/21 0418 06/09/21 0744 06/09/21 1111 06/09/21 1548  GLUCAP 140* 155* 125* 169* 147*   Lipid Profile: No results for input(s): CHOL, HDL, LDLCALC, TRIG, CHOLHDL, LDLDIRECT in the last 72 hours. Thyroid Function Tests: No results for input(s): TSH, T4TOTAL, FREET4, T3FREE, THYROIDAB in the last 72 hours. Anemia Panel: No results for input(s): VITAMINB12, FOLATE, FERRITIN, TIBC, IRON, RETICCTPCT in the last 72 hours.    Radiology Studies: I have reviewed all of the imaging during this hospital visit personally     Scheduled Meds:  acetaminophen  650 mg Oral QHS   allopurinol  300 mg Oral BID   amLODipine  10 mg Oral Daily   carvedilol  3.125 mg Oral BID WC   Chlorhexidine Gluconate Cloth  6 each Topical Daily   docusate sodium  100 mg Oral BID   enoxaparin (LOVENOX) injection  100 mg Subcutaneous Q24H   furosemide  60 mg Intravenous Q8H   insulin aspart  2-6 Units Subcutaneous Q4H   ipratropium-albuterol  3 mL Nebulization Q6H   multivitamin with minerals  1 tablet Oral Daily   polyethylene glycol  17 g Oral Daily   Ensure Max Protein  11 oz Oral Daily   sodium chloride flush  10-40 mL Intracatheter Q12H    Continuous Infusions:  sodium chloride 10 mL/hr at 06/08/21 0500     LOS: 9 days        Michael Yim Annett Gula, MD

## 2021-06-09 NOTE — Progress Notes (Signed)
Physical Therapy Treatment Patient Details Name: Michael Payne MRN: [id number] DOB: 07-09-1971 Today's Date: 06/09/2021   History of Present Illness Pt is a 51 y.o. male admitted 05/31/21 with SOB, cough, orthopnea. Workup for acute on chronic hypoxemic and hypercarbic respiratory failure. ETT 9/13-9/19. PMH includes morbid obesity, DM2, OSA (non compliant CPAP), HTN, chronic narcotic use for LE pain, bedbound x 2 years.   PT Comments    Pt slowly progressing with mobility. Pt requires maxA+2 and bed turning assist to roll; pt declines attempts at sitting EOB today, reports, "I'm not ready yet." Performed BUE therex with theraband (HEP handout provided). Increased time discussing d/c plans, which pt reports his preference is home. If family unable to continue providing necessary assist, pt will require SNF/ALF for all aspects of care.    Recommendations for follow up therapy are one component of a multi-disciplinary discharge planning process, led by the attending physician.  Recommendations may be updated based on patient status, additional functional criteria and insurance authorization.  Follow Up Recommendations  Supervision for mobility/OOB     Equipment Recommendations  Other (comment) (lift equipment)    Recommendations for Other Services       Precautions / Restrictions Precautions Precautions: Fall;Other (comment) Precaution Comments: Has been bedbound for ~2 years; chronic R hip pain     Mobility  Bed Mobility Overal bed mobility: Needs Assistance Bed Mobility: Rolling Rolling: Max assist;+2 for physical assistance (w/ turning assist from air mattress)         General bed mobility comments: Pt able to minimally reach LUE across body to bed rail to assist with rolling, unable to reach rail; able to partially roll onto R-side with maxA+2 and use of bed rolling feature to offload pt c/o uncomfortable L hip    Transfers                 General transfer comment:  pt declined attempt to sit EOB, reports, "I'm not ready to try that yet"  Ambulation/Gait                 Stairs             Wheelchair Mobility    Modified Rankin (Stroke Patients Only)       Balance                                            Cognition Arousal/Alertness: Lethargic;Awake/alert Behavior During Therapy: WFL for tasks assessed/performed;Flat affect Overall Cognitive Status: No family/caregiver present to determine baseline cognitive functioning                                 General Comments: Cognition WFL for simple tasks; but question decreased insight into current functional mobility deficits and what discharge entails; increased time discussing d/c recs for return home since pt near baseline mobility, pt reports preference to home, but non-committal to what d/c plan should be      Exercises Other Exercises Other Exercises: [employer] HEP handout (Access Code [email or web address]) and blue theraband provided - pt able to demonstrate bicep curls, shoulder flexion, horizontal add (across body reaching); difficulty performing shoulder IR due to body habitus and bed positioning    General Comments General comments (skin integrity, edema, etc.): Increased time discussing d/c planning - unsure pt has grasp on  this, not very involved in discussion      Pertinent Vitals/Pain Pain Assessment: Faces Faces Pain Scale: Hurts little more Pain Location: RLE > L hip Pain Descriptors / Indicators: Grimacing;Guarding;Discomfort Pain Intervention(s): Limited activity within patient's tolerance;Repositioned    Home Living                      Prior Function            PT Goals (current goals can now be found in the care plan section) Acute Rehab PT Goals Patient Stated Goal: return home Progress towards PT goals: Progressing toward goals (slowly)    Frequency    Min 2X/week      PT Plan Current plan remains  appropriate    Co-evaluation              AM-PAC PT "6 Clicks" Mobility   Outcome Measure  Help needed turning from your back to your side while in a flat bed without using bedrails?: Total Help needed moving from lying on your back to sitting on the side of a flat bed without using bedrails?: Total Help needed moving to and from a bed to a chair (including a wheelchair)?: Total Help needed standing up from a chair using your arms (e.g., wheelchair or bedside chair)?: Total Help needed to walk in hospital room?: Total Help needed climbing 3-5 steps with a railing? : Total 6 Click Score: 6    End of Session   Activity Tolerance: Patient limited by fatigue Patient left: in bed;with call bell/phone within reach Nurse Communication: Mobility status PT Visit Diagnosis: Muscle weakness (generalized) (M62.81)     Time: 6283-6629 (substracted 20-min time getting theraband/HEP) PT Time Calculation (min) (ACUTE ONLY): 45 min  Charges:  $Therapeutic Exercise: 8-22 mins $Therapeutic Activity: 8-22 mins                     Michael Payne, PT, DPT Acute Rehabilitation Services  Pager [phone] Office [phone]  Michael Payne 06/09/2021, 4:51 PM

## 2021-06-09 NOTE — Plan of Care (Signed)

## 2021-06-09 NOTE — Progress Notes (Signed)
RN called regarding pt not keeping bipap mask on, took mask off x's 3. RN placed pt back on [place] at this time.

## 2021-06-10 ENCOUNTER — Inpatient Hospital Stay (HOSPITAL_COMMUNITY): Payer: Medicare HMO

## 2021-06-10 DIAGNOSIS — M1990 Unspecified osteoarthritis, unspecified site: Secondary | ICD-10-CM

## 2021-06-10 DIAGNOSIS — R0902 Hypoxemia: Secondary | ICD-10-CM | POA: Diagnosis not present

## 2021-06-10 DIAGNOSIS — L8942 Pressure ulcer of contiguous site of back, buttock and hip, stage 2: Secondary | ICD-10-CM | POA: Diagnosis not present

## 2021-06-10 DIAGNOSIS — R7303 Prediabetes: Secondary | ICD-10-CM

## 2021-06-10 DIAGNOSIS — I5033 Acute on chronic diastolic (congestive) heart failure: Secondary | ICD-10-CM | POA: Diagnosis not present

## 2021-06-10 LAB — BASIC METABOLIC PANEL
Anion gap: 8 (ref 5–15)
BUN: 41 mg/dL — ABNORMAL HIGH (ref 6–20)
CO2: 40 mmol/L — ABNORMAL HIGH (ref 22–32)
Calcium: 9 mg/dL (ref 8.9–10.3)
Chloride: 89 mmol/L — ABNORMAL LOW (ref 98–111)
Creatinine, Ser: 1.29 mg/dL — ABNORMAL HIGH (ref 0.61–1.24)
GFR, Estimated: >60 mL/min (ref >60)
Glucose, Bld: 131 mg/dL — ABNORMAL HIGH (ref 70–99)
Potassium: 4 mmol/L (ref 3.5–5.1)
Sodium: 137 mmol/L (ref 135–145)

## 2021-06-10 LAB — GLUCOSE, CAPILLARY
Glucose-Capillary: 130 mg/dL — ABNORMAL HIGH (ref 70–99)
Glucose-Capillary: 131 mg/dL — ABNORMAL HIGH (ref 70–99)
Glucose-Capillary: 141 mg/dL — ABNORMAL HIGH (ref 70–99)
Glucose-Capillary: 188 mg/dL — ABNORMAL HIGH (ref 70–99)

## 2021-06-10 LAB — MAGNESIUM: Magnesium: 2.3 mg/dL (ref 1.7–2.4)

## 2021-06-10 MED ORDER — IPRATROPIUM-ALBUTEROL 0.5-2.5 (3) MG/3ML IN SOLN
3.0000 mL | Freq: Two times a day (BID) | RESPIRATORY_TRACT | Status: DC
  Administered 2021-06-10 – 2021-06-12 (×6): 3 mL via RESPIRATORY_TRACT
  Filled 2021-06-10 (×6): qty 3

## 2021-06-10 MED ORDER — METFORMIN HCL 500 MG PO TABS
500.0000 mg | ORAL_TABLET | Freq: Every day | ORAL | Status: DC
  Administered 2021-06-11 – 2021-06-15 (×5): 500 mg via ORAL
  Filled 2021-06-10 (×5): qty 1

## 2021-06-10 NOTE — Progress Notes (Signed)
Unable to get a bed weight on the patient due to malfunctioning of the sizewise bed.  

## 2021-06-10 NOTE — Progress Notes (Addendum)
PROGRESS NOTE    Michael Payne  TZG:[id number] DOB: [birth year] DOA: 05/31/2021 PCP: Joaquim Nam, MD    Brief Narrative:  Mr. Nott was admitted to the hospital with the working diagnosis of acute hypoxemic/hypercapnic respiratory failure due to heart failure exacerbation.   50 year old male past medical history for obesity class III, obstructive sleep apnea, hypertension, and ambulatory dysfunction, who presented with dyspnea.  Patient is bedbound, complained of feeling weak for several weeks, positive productive cough, positive orthopnea and rapidly progressing dyspnea.  On his initial physical examination blood pressure 164/101, heart rate 104, respirate 20, oxygen saturation 86%, he had scattered wheezing and rales bilaterally, increased work of breathing, heart S1-S2, present, rhythmic, abdomen protuberant, soft nontender, positive lower extremity edema.   Venous blood gas with a pH of 7.23, PCO2 98.1, PO2 78.6, sodium 138, potassium 4.6, chloride 96, bicarb 34, glucose 116, BUN 6, creatinine 0.69.  White count 10.0, hemoglobin 14.5, hematocrit 51.3, platelets 264. SARS COVID-19 negative.   Urinalysis [place] gravity 1.006. Toxicology negative.   Chest radiograph with significant bilateral ischial infiltrates. CT chest with multifocal airspace disease bilateral lower lobes and right upper lobe. Moderate cardiomegaly.   EKG 94 bpm, left axis deviation, normal intervals, sinus rhythm, no significant ST segment or T wave changes.   Patient was placed on noninvasive mechanical ventilation, follow-up PCO2 greater than 100, after failing noninvasive mechanical ventilation he was intubated and admitted to the intensive care unit  Patient placed on aggressive diuresis for volume overload.  Liberated from mechanical ventilation on 09.19.22.   Transfer to [facility] on 09.21.    Patient continue to decline Bipap at night. Continue to have right hip pain.   Assessment & Plan:   Principal  Problem:   Acute diastolic CHF (congestive heart failure) (HCC) Active Problems:   HTN (hypertension)   Severe obstructive sleep apnea   Bedbound   Hypoxia   Respiratory failure (HCC)   Pressure injury of skin   Pre-diabetes   Obesity, Class III, BMI 40-49.9 (morbid obesity) (HCC)   Osteoarthritis   Acute hypoxemic and hypercapnic respiratory failure, OHV, Obesity class 3. Volume overload acute diastolic heart failure decompensation.  Follow up chest radiograph with improvement in interstitial infiltrates. Today decreased supplemental 02 to 5 L min per HFNC with oxygen saturation 93%.    Echocardiogram with preserved LV and  RV systolic function.    Plan to continue diuresis with furosemide, will transition to oral diuretic therapy in am.  Change Bipap to Cpap to improve compliance at night. Patient has a Cpap at home but is not compliant.  Patient is not ambulatory due to ostearthritis, he follows with obesity clinic but has difficulty with transportation.    2. HTN. Blood pressure control with amlodipine and carvedilol.    3. Pre- T2DM. Capillary glucose 141, 131, 188. Hgb A1c 5,9 Will discontinue insulin therapy and will start patient on metformin.    4. Right posterior thigh pressure ulcer stage 2. Not clear if present on admission. Continue local wound care.   5. Right hip osteoarthritis. Continue pain control with oxycodone and acetaminophen. Physical therapy evaluation    Patient continue to be at high risk for worsening respiratory failure   Status is: Inpatient  Remains inpatient appropriate because:Inpatient level of care appropriate due to severity of illness  Dispo: The patient is from: Home              Anticipated d/c is to: SNF  Patient currently is not medically stable to d/c.   Difficult to place patient No   DVT prophylaxis: Enoxaparin   Code Status:    full  Family Communication:  I spoke with patient's wife at the bedside, we talked in  detail about patient's condition, plan of care and prognosis and all questions were addressed.      Nutrition Status: Nutrition Problem: Increased nutrient needs Etiology: acute illness Signs/Symptoms: estimated needs Interventions: Tube feeding     Skin Documentation: Pressure Injury 06/04/21 Thigh Right;Posterior Stage 2 -  Partial thickness loss of dermis presenting as a shallow open injury with a red, pink wound bed without slough. Intact blister 1.5 cmX 1.5 cm (Active)  06/04/21 1000  Location: Thigh  Location Orientation: Right;Posterior  Staging: Stage 2 -  Partial thickness loss of dermis presenting as a shallow open injury with a red, pink wound bed without slough.  Wound Description (Comments): Intact blister 1.5 cmX 1.5 cm  Present on Admission:      Subjective: Patient complains of right hip pain, worse with movement, no nausea or vomiting, no chest pain. Continue to be very weak and deconditioned, not back to his baseline. Continue to decline Bipap.   Objective: Vitals:   06/10/21 0435 06/10/21 0725 06/10/21 0747 06/10/21 0805  BP: 120/83 126/76 100/62 115/60  Pulse: 86 83 80 83  Resp: 19 20 19    Temp: 98.2 F (36.8 C)  97.9 F (36.6 C)   TempSrc: Oral  Oral   SpO2: 94% 93% 96%   Weight:      Height:        Intake/Output Summary (Last 24 hours) at 06/10/2021 0841 Last data filed at 06/10/2021 0537 Gross per 24 hour  Intake 737 ml  Output 2950 ml  Net -2213 ml   Filed Weights   06/06/21 0700 06/07/21 0100 06/08/21 0500  Weight: (!) 295.7 kg (!) 297.6 kg (!) 271.7 kg    Examination:   General: Not in pain or dyspnea, deconditioned  Neurology: today somnolent but easy to arouse E ENT: no pallor, no icterus, oral mucosa moist Cardiovascular: No JVD. S1-S2 present, rhythmic, no gallops, rubs, or murmurs. No lower extremity edema. Pulmonary:  positive breath sounds bilaterally,with  no wheezing, rhonchi or rale. On anterior auscultation, limited due to  body habitus.  Gastrointestinal. Abdomen soft and non tender, protuberant Skin. No rashes Musculoskeletal: no joint deformities  Right IJ central line Positive foley catheter.    Data Reviewed: I have personally reviewed following labs and imaging studies  CBC: Recent Labs  Lab 06/04/21 0320 06/04/21 0341 06/05/21 0300 06/06/21 0219 06/07/21 0408 06/07/21 0446  WBC 9.8  --  7.9 6.9  --  8.2  NEUTROABS  --   --  5.3 4.5  --  5.5  HGB 13.3 14.3 13.0 12.7* 15.6 13.5  HCT 46.5 42.0 46.2 46.2 46.0 49.2  MCV 98.1  --  98.7 99.8  --  102.1*  PLT 262  --  274 269  --  284   Basic Metabolic Panel: Recent Labs  Lab 06/06/21 1230 06/06/21 2051 06/07/21 0408 06/07/21 0446 06/08/21 0439 06/09/21 0500 06/10/21 0450  NA 146* 147* 146* 146* 143 139 137  K 3.6 4.0 4.2 4.3 3.7 4.7 4.0  CL 103 104  --  102 97* 92* 89*  CO2 32 33*  --  32 35* 39* 40*  GLUCOSE 121* 133*  --  138* 122* 138* 131*  BUN 32* 39*  --  39*  37* 38* 41*  CREATININE 1.24 1.53*  --  1.51* 1.17 1.40* 1.29*  CALCIUM 8.7* 9.1  --  9.2 9.0 9.1 9.0  MG 2.2  --   --  2.3 2.2 2.3 2.3   GFR: Estimated Creatinine Clearance: 150.8 mL/min (A) (by C-G formula based on SCr of 1.29 mg/dL (H)). Liver Function Tests: Recent Labs  Lab 06/07/21 0446  AST 43*  ALT 53*  ALKPHOS 43  BILITOT 0.7  PROT 7.7  ALBUMIN 3.1*   No results for input(s): LIPASE, AMYLASE in the last 168 hours. No results for input(s): AMMONIA in the last 168 hours. Coagulation Profile: No results for input(s): INR, PROTIME in the last 168 hours. Cardiac Enzymes: No results for input(s): CKTOTAL, CKMB, CKMBINDEX, TROPONINI in the last 168 hours. BNP (last 3 results) Recent Labs    03/09/21 1232  PROBNP 23.0   HbA1C: No results for input(s): HGBA1C in the last 72 hours. CBG: Recent Labs  Lab 06/09/21 1548 06/09/21 1939 06/10/21 0005 06/10/21 0357 06/10/21 0759  GLUCAP 147* 170* 141* 131* 188*   Lipid Profile: No results for  input(s): CHOL, HDL, LDLCALC, TRIG, CHOLHDL, LDLDIRECT in the last 72 hours. Thyroid Function Tests: No results for input(s): TSH, T4TOTAL, FREET4, T3FREE, THYROIDAB in the last 72 hours. Anemia Panel: No results for input(s): VITAMINB12, FOLATE, FERRITIN, TIBC, IRON, RETICCTPCT in the last 72 hours.    Radiology Studies: I have reviewed all of the imaging during this hospital visit personally     Scheduled Meds:  acetaminophen  650 mg Oral QHS   allopurinol  300 mg Oral BID   amLODipine  10 mg Oral Daily   carvedilol  3.125 mg Oral BID WC   Chlorhexidine Gluconate Cloth  6 each Topical Daily   docusate sodium  100 mg Oral BID   enoxaparin (LOVENOX) injection  100 mg Subcutaneous Q24H   furosemide  60 mg Intravenous Q8H   insulin aspart  2-6 Units Subcutaneous Q4H   ipratropium-albuterol  3 mL Nebulization BID   multivitamin with minerals  1 tablet Oral Daily   polyethylene glycol  17 g Oral Daily   Ensure Max Protein  11 oz Oral Daily   sodium chloride flush  10-40 mL Intracatheter Q12H   Continuous Infusions:  sodium chloride 10 mL/hr at 06/08/21 0500     LOS: 10 days        Shandrea Lusk Annett Gula, MD

## 2021-06-10 NOTE — Consult Note (Signed)
   [facility] Inpatient Consult   06/10/2021  Michael Payne April 04, 1971 [id number]  [facility] [[facility]]  Accountable Care Organization [ACO] Patient: [place] Medicare  Primary Care Provider:  Joaquim Nam, MD is active in an Embedded Chronic Care Management team and program.  *Patient is in the [facility] Medicare SNP as well   Patient screened for length of stay hospitalization with noted.  Patient recently transitioned out of ICU to Progressive status.  Review of patient's medical record reveals patient is currently being recommended for a skilled nursing facility level of care.  Further review of patient's [facility] Care Management roster reveals that patient's is in the [facility] SNP for his care management.     Plan: Updates to be sent to Embedded CCM team when disposition/TOC information is available.  For questions contact:   Charlesetta Shanks, RN BSN CCM [facility]  [phone] business mobile phone Toll free office [phone]  Fax number: [phone] [email or web address]

## 2021-06-10 NOTE — Progress Notes (Signed)
Pt refused cpap

## 2021-06-11 ENCOUNTER — Telehealth: Payer: Medicare HMO

## 2021-06-11 DIAGNOSIS — I10 Essential (primary) hypertension: Secondary | ICD-10-CM

## 2021-06-11 DIAGNOSIS — M1611 Unilateral primary osteoarthritis, right hip: Secondary | ICD-10-CM

## 2021-06-11 DIAGNOSIS — I5031 Acute diastolic (congestive) heart failure: Secondary | ICD-10-CM | POA: Diagnosis not present

## 2021-06-11 LAB — BASIC METABOLIC PANEL
Anion gap: 7 (ref 5–15)
BUN: 43 mg/dL — ABNORMAL HIGH (ref 6–20)
CO2: 40 mmol/L — ABNORMAL HIGH (ref 22–32)
Calcium: 8.9 mg/dL (ref 8.9–10.3)
Chloride: 90 mmol/L — ABNORMAL LOW (ref 98–111)
Creatinine, Ser: 1.21 mg/dL (ref 0.61–1.24)
GFR, Estimated: >60 mL/min (ref >60)
Glucose, Bld: 125 mg/dL — ABNORMAL HIGH (ref 70–99)
Potassium: 3.8 mmol/L (ref 3.5–5.1)
Sodium: 137 mmol/L (ref 135–145)

## 2021-06-11 LAB — MAGNESIUM: Magnesium: 2.4 mg/dL (ref 1.7–2.4)

## 2021-06-11 LAB — GLUCOSE, CAPILLARY: Glucose-Capillary: 146 mg/dL — ABNORMAL HIGH (ref 70–99)

## 2021-06-11 MED ORDER — FUROSEMIDE 40 MG PO TABS
40.0000 mg | ORAL_TABLET | Freq: Two times a day (BID) | ORAL | Status: DC
  Administered 2021-06-11 – 2021-06-12 (×2): 40 mg via ORAL
  Filled 2021-06-11 (×2): qty 1

## 2021-06-11 MED ORDER — ENSURE MAX PROTEIN PO LIQD
11.0000 [oz_av] | Freq: Two times a day (BID) | ORAL | Status: DC
  Administered 2021-06-11 – 2021-06-13 (×4): 11 [oz_av] via ORAL
  Filled 2021-06-11 (×13): qty 330

## 2021-06-11 MED ORDER — OXYCODONE-ACETAMINOPHEN 5-325 MG PO TABS
2.0000 | ORAL_TABLET | Freq: Four times a day (QID) | ORAL | Status: DC | PRN
  Administered 2021-06-11 – 2021-06-13 (×4): 2 via ORAL
  Filled 2021-06-11 (×4): qty 2

## 2021-06-11 NOTE — Progress Notes (Signed)
Unable to get a bed weight on the patient due to malfunctioning of the sizewise bed.

## 2021-06-11 NOTE — Progress Notes (Signed)
Pt. Does not want to wear the bipap. Pt. States he will just wear his cannula.

## 2021-06-11 NOTE — Progress Notes (Signed)
Nutrition Follow-up  DOCUMENTATION CODES:   Morbid obesity  INTERVENTION:   - Ensure Max po BID, each supplement provides 150 kcal and 30 grams of protein.   - MVI w/ minerals daily   NUTRITION DIAGNOSIS:   Increased nutrient needs related to acute illness as evidenced by estimated needs. - Ongoing  GOAL:   Patient will meet greater than or equal to 90% of their needs - Progressing   MONITOR:   PO intake, Supplement acceptance, Skin, I & O's  REASON FOR ASSESSMENT:   Consult, Ventilator Enteral/tube feeding initiation and management  ASSESSMENT:   Patient with PMH significant for GERD, HTN, DM, chronic R hip pain, and OSA requiring CPAP. Presents this admission with respiratory failure secondary to CHF exacerbation and noncompliance with CPAP at home.  Pt resting in bed with wife present at bedside. Pt seems more lethargic and less energetic than last visit.    Pt reports that his appetite is good and that he is eating everything on his trays. No intakes recorded per EMR.   Reports that he has only received one Ensure Max since they were ordered on 9/19. Will call Pharmacy to make sure they are being delivered to the unit.   Pt or wife with no other questions or concerns at this time.   Current weight: 271.7 kg Admission weight: 222.3 kg  *Unsure accuracy of weights   Medications reviewed and include: Lasix, Metformin, MVI Labs reviewed: BUN 43, 24 hr BG trends: 130-188 mg/dL  UOP: 7741 mL x 24 hr I/O's -23052 mL since admit  Diet Order:   Diet Order             Diet Heart Room service appropriate? Yes; Fluid consistency: Thin  Diet effective now                   EDUCATION NEEDS:   No education needs have been identified at this time  Skin:  Skin Assessment:  Skin Integrity Issues: Stage II: R thigh  Last BM:  9/22  Height:   Ht Readings from Last 1 Encounters:  06/02/21 5\' 11"  (1.803 m)    Weight:   Wt Readings from Last 1  Encounters:  11/05/20 (!) 222.3 kg    Ideal Body Weight:     BMI:  Body mass index is 83.54 kg/m.  Estimated Nutritional Needs:   Kcal:  2500-2700 kcal  Protein:  125-145 grams  Fluid:  >/= 2 L/day    11/07/20 BS, PLDN Clinical Dietitian See [facility] for contact information.

## 2021-06-11 NOTE — Progress Notes (Signed)
PROGRESS NOTE    MANDY FITZWATER  ACZ:[id number] DOB: 1971-04-13 DOA: 05/31/2021 PCP: Joaquim Nam, MD    Brief Narrative:  Mr. Barretto was admitted to the hospital with the working diagnosis of acute hypoxemic/hypercapnic respiratory failure due to heart failure exacerbation.   50 year old male past medical history for obesity class III, obstructive sleep apnea, hypertension, and ambulatory dysfunction, who presented with dyspnea.  Patient is bedbound, complained of feeling weak for several weeks, positive productive cough, positive orthopnea and rapidly progressing dyspnea.  On his initial physical examination blood pressure 164/101, heart rate 104, respirate 20, oxygen saturation 86%, he had scattered wheezing and rales bilaterally, increased work of breathing, heart S1-S2, present, rhythmic, abdomen protuberant, soft nontender, positive lower extremity edema.   Venous blood gas with a pH of 7.23, PCO2 98.1, PO2 78.6, sodium 138, potassium 4.6, chloride 96, bicarb 34, glucose 116, BUN 6, creatinine 0.69.  White count 10.0, hemoglobin 14.5, hematocrit 51.3, platelets 264. SARS COVID-19 negative.   Urinalysis Pacific gravity 1.006. Toxicology negative.   Chest radiograph with significant bilateral ischial infiltrates. CT chest with multifocal airspace disease bilateral lower lobes and right upper lobe. Moderate cardiomegaly.   EKG 94 bpm, left axis deviation, normal intervals, sinus rhythm, no significant ST segment or T wave changes.   Patient was placed on noninvasive mechanical ventilation, follow-up PCO2 greater than 100, after failing noninvasive mechanical ventilation he was intubated and admitted to the intensive care unit   Patient placed on aggressive diuresis for volume overload.  Liberated from mechanical ventilation on 09.19.22.    Transfer to [facility] on 09.21.     Patient continue to decline Bipap at night. Continue to have right hip pain. Bipap changed to Cpap to improve  compliance.    Assessment & Plan:   Principal Problem:   Acute diastolic CHF (congestive heart failure) (HCC) Active Problems:   HTN (hypertension)   Severe obstructive sleep apnea   Bedbound   Hypoxia   Respiratory failure (HCC)   Pressure injury of skin   Pre-diabetes   Obesity, Class III, BMI 40-49.9 (morbid obesity) (HCC)   Osteoarthritis   Acute hypoxemic and hypercapnic respiratory failure, OHS, Obesity class 3.  Volume overload due to acute diastolic heart failure decompensation.  Echocardiogram with preserved LV and  RV systolic function Follow up chest radiograph 09/22 with improvement in interstitial infiltrates. Patient maintaining oxygen saturation 92% or greater with 5 L/min per [place].    Urine output over last 24 hrs 2000 ml.  Blood pressure systolic 94 to 137 mmHg, this am 101 mmHg.  Plan to transition to oral furosemide 40 mg bid.  Continue to be non compliant with Cpap, will change to nasal interface.     2. HTN. Continue with amlodipine and carvedilol.    3. Pre- T2DM. Hgb A1c 5,9 Tolerating well metformin. Now off insulin therapy.    4. Right posterior thigh pressure ulcer stage 2. Not clear if present on admission. Continue local wound care.    5. Right hip osteoarthritis. On oxycodone and acetaminophen. Physical therapy evaluation. Patient needs supervision for mobility, her wife not able to take care of him at home, looking into SNF.   6. AKI, metabolic alkalosis Renal function with serum cr at 1,21 with K at 3,8 and serum bicarbonate at 40.   Change to oral furosemide and follow up renal function and electrolytes in am,   Status is: Inpatient  Remains inpatient appropriate because:Inpatient level of care appropriate due to severity of  illness  Dispo: The patient is from: Home              Anticipated d/c is to: SNF              Patient currently is not medically stable to d/c.   Difficult to place patient No   DVT prophylaxis: Enoxaparin    Code Status:    full Family Communication:  I spoke with patient's wife at the bedside, we talked in detail about patient's condition, plan of care and prognosis and all questions were addressed.      Nutrition Status: Nutrition Problem: Increased nutrient needs Etiology: acute illness Signs/Symptoms: estimated needs Interventions: Premier Protein, MVI     Skin Documentation: Pressure Injury 06/04/21 Thigh Right;Posterior Stage 2 -  Partial thickness loss of dermis presenting as a shallow open injury with a red, pink wound bed without slough. Intact blister 1.5 cmX 1.5 cm (Active)  06/04/21 1000  Location: Thigh  Location Orientation: Right;Posterior  Staging: Stage 2 -  Partial thickness loss of dermis presenting as a shallow open injury with a red, pink wound bed without slough.  Wound Description (Comments): Intact blister 1.5 cmX 1.5 cm  Present on Admission:       Subjective: Patient continue to be very weak and deconditioned, has right hip pain, his dyspnea has improved but not yet back to baseline.   Objective: Vitals:   06/10/21 2341 06/11/21 0338 06/11/21 0716 06/11/21 1042  BP: 106/61 135/78 116/72 101/69  Pulse: 83 85 92 80  Resp: 18 16 18 20   Temp: 98.2 F (36.8 C) 98 F (36.7 C)  98 F (36.7 C)  TempSrc: Oral Oral  Oral  SpO2: 97% 94% 94% 93%  Weight:      Height:        Intake/Output Summary (Last 24 hours) at 06/11/2021 1206 Last data filed at 06/11/2021 1000 Gross per 24 hour  Intake --  Output 3200 ml  Net -3200 ml   Filed Weights   06/07/21 0100 06/08/21 0500  Weight: (!) 297.6 kg (!) 271.7 kg    Examination:   General: Not in pain or dyspnea, deconditioned  Neurology: Awake and alert, non focal  E ENT: mild pallor, no icterus, oral mucosa moist Cardiovascular: No JVD. S1-S2 present, rhythmic, no gallops, rubs, or murmurs. Non pitting bilateral lower extremity edema. Pulmonary: positive breath sounds bilaterally, mild expiratory  wheezing, with no rhonchi or rales. Gastrointestinal. Abdomen protuberant. Soft and non tender Skin. No rashes Musculoskeletal: no joint deformities     Data Reviewed: I have personally reviewed following labs and imaging studies  CBC: Recent Labs  Lab 06/05/21 0300 06/06/21 0219 06/07/21 0408 06/07/21 0446  WBC 7.9 6.9  --  8.2  NEUTROABS 5.3 4.5  --  5.5  HGB 13.0 12.7* 15.6 13.5  HCT 46.2 46.2 46.0 49.2  MCV 98.7 99.8  --  102.1*  PLT 274 269  --  284   Basic Metabolic Panel: Recent Labs  Lab 06/07/21 0446 06/08/21 0439 06/09/21 0500 06/10/21 0450 06/11/21 0059  NA 146* 143 139 137 137  K 4.3 3.7 4.7 4.0 3.8  CL 102 97* 92* 89* 90*  CO2 32 35* 39* 40* 40*  GLUCOSE 138* 122* 138* 131* 125*  BUN 39* 37* 38* 41* 43*  CREATININE 1.51* 1.17 1.40* 1.29* 1.21  CALCIUM 9.2 9.0 9.1 9.0 8.9  MG 2.3 2.2 2.3 2.3 2.4   GFR: Estimated Creatinine Clearance: 160.8 mL/min (by C-G formula based  on SCr of 1.21 mg/dL). Liver Function Tests: Recent Labs  Lab 06/07/21 0446  AST 43*  ALT 53*  ALKPHOS 43  BILITOT 0.7  PROT 7.7  ALBUMIN 3.1*   No results for input(s): LIPASE, AMYLASE in the last 168 hours. No results for input(s): AMMONIA in the last 168 hours. Coagulation Profile: No results for input(s): INR, PROTIME in the last 168 hours. Cardiac Enzymes: No results for input(s): CKTOTAL, CKMB, CKMBINDEX, TROPONINI in the last 168 hours. BNP (last 3 results) Recent Labs    03/09/21 1232  PROBNP 23.0   HbA1C: No results for input(s): HGBA1C in the last 72 hours. CBG: Recent Labs  Lab 06/10/21 0005 06/10/21 0357 06/10/21 0759 06/10/21 1134 06/11/21 1041  GLUCAP 141* 131* 188* 130* 146*   Lipid Profile: No results for input(s): CHOL, HDL, LDLCALC, TRIG, CHOLHDL, LDLDIRECT in the last 72 hours. Thyroid Function Tests: No results for input(s): TSH, T4TOTAL, FREET4, T3FREE, THYROIDAB in the last 72 hours. Anemia Panel: No results for input(s): VITAMINB12,  FOLATE, FERRITIN, TIBC, IRON, RETICCTPCT in the last 72 hours.    Radiology Studies: I have reviewed all of the imaging during this hospital visit personally     Scheduled Meds:  acetaminophen  650 mg Oral QHS   allopurinol  300 mg Oral BID   amLODipine  10 mg Oral Daily   carvedilol  3.125 mg Oral BID WC   Chlorhexidine Gluconate Cloth  6 each Topical Daily   docusate sodium  100 mg Oral BID   enoxaparin (LOVENOX) injection  100 mg Subcutaneous Q24H   furosemide  60 mg Intravenous Q8H   ipratropium-albuterol  3 mL Nebulization BID   metFORMIN  500 mg Oral Q breakfast   multivitamin with minerals  1 tablet Oral Daily   polyethylene glycol  17 g Oral Daily   Ensure Max Protein  11 oz Oral BID   sodium chloride flush  10-40 mL Intracatheter Q12H   Continuous Infusions:   LOS: 11 days        Martese Vanatta Annett Gula, MD

## 2021-06-11 NOTE — Progress Notes (Signed)
HOSPITAL MEDICINE OVERNIGHT EVENT NOTE    Nursing reports the patient is complaining of right groin pain, unrelieved by as needed Tylenol.  According to nursing patient is reporting that he has had this pain for at least the past 4 days.  Foley catheter appears to be draining appropriately with no evidence of hematuria.  No obvious physical deformity on visualization by nursing.  Chart reviewed, patient has chronic right hip osteoarthritis and is supposed to be on a regimen of as needed oxycodone 4 times daily.  Review of the chart reveals that this medication seems to have expired within the past 24 hours.  We will reorder.  I have asked nursing to notify me if patient's pain persists or worsens despite resuming his home regimen of Percocet.  Marinda Elk  MD [facility]

## 2021-06-11 NOTE — Progress Notes (Signed)
Physical Therapy Treatment Patient Details Name: Michael Payne MRN: [id number] DOB: [birth year] Today's Date: 06/11/2021   History of Present Illness Pt is a 50 y.o. male admitted 05/31/21 with SOB, cough, orthopnea. Workup for acute on chronic hypoxemic and hypercarbic respiratory failure. ETT 9/13-9/19. PMH includes morbid obesity, DM2, OSA (non compliant CPAP), HTN, chronic narcotic use for LE pain, bedbound x 2 years.    PT Comments    The pt demos slight improvement with ability to initiate and generate movements with BUE to complete bed mobility at this time. He was able to use RUE and LUE with bed rails to complete rolling to L and to R with modA, but continues to need maxA of 2-3 to maintain sidelying. The pt was then agreeable to attempt sitting EOB but was unable to complete despite repeated attempts due to increased pain in R hip with hip flexion. Pt then taken through series of ROM exercises for R hip to improve mobility for future attempts. Will continue to benefit from skilled PT to progress mobility back towards functional baseline.     Recommendations for follow up therapy are one component of a multi-disciplinary discharge planning process, led by the attending physician.  Recommendations may be updated based on patient status, additional functional criteria and insurance authorization.  Follow Up Recommendations  Supervision for mobility/OOB     Equipment Recommendations  Other (comment)    Recommendations for Other Services       Precautions / Restrictions Precautions Precautions: Fall;Other (comment) Precaution Comments: Has been bedbound for ~2 years; chronic R hip pain Restrictions Weight Bearing Restrictions: No     Mobility  Bed Mobility Overal bed mobility: Needs Assistance Bed Mobility: Rolling;Supine to Sit Rolling: Mod assist;Max assist;+2 for safety/equipment   Supine to sit: Max assist;+2 for safety/equipment;HOB elevated     General bed mobility  comments: pt able to reach and pull well with BUE on bed rails, needs assist to position legs in crossed position, modA to complete roll, maxA to maintain in sidelying position. attempted to pull to sit at EOB, limited by significant R hip pain with trunk flexion. attempted x3    Transfers                 General transfer comment: pt unable to achieve sitting EOB        Cognition Arousal/Alertness: Awake/alert Behavior During Therapy: WFL for tasks assessed/performed;Flat affect Overall Cognitive Status: No family/caregiver present to determine baseline cognitive functioning                                 General Comments: pt able to follow all instructions and initiate movements needed to complete bed mobility. able to describe technique from home and what about current set up is limiting his ability to mobilize      Exercises Other Exercises Other Exercises: R hip flexion, PROM x10, AAROM x10, self-ROM with use of sheet under his knee to achieve hip flexion x10 all within pain-free ROM    General Comments General comments (skin integrity, edema, etc.): VSS on 5L O2      Pertinent Vitals/Pain Pain Assessment: Faces Faces Pain Scale: Hurts whole lot Pain Location: R hip Pain Descriptors / Indicators: Grimacing;Guarding;Discomfort Pain Intervention(s): Limited activity within patient's tolerance;Monitored during session;Repositioned     PT Goals (current goals can now be found in the care plan section) Acute Rehab PT Goals Patient Stated Goal: return home  PT Goal Formulation: With patient Time For Goal Achievement: 06/18/21 Potential to Achieve Goals: Fair Progress towards PT goals: Progressing toward goals    Frequency    Min 2X/week      PT Plan Current plan remains appropriate       AM-PAC PT "6 Clicks" Mobility   Outcome Measure  Help needed turning from your back to your side while in a flat bed without using bedrails?: Total Help  needed moving from lying on your back to sitting on the side of a flat bed without using bedrails?: Total Help needed moving to and from a bed to a chair (including a wheelchair)?: Total Help needed standing up from a chair using your arms (e.g., wheelchair or bedside chair)?: Total Help needed to walk in hospital room?: Total Help needed climbing 3-5 steps with a railing? : Total 6 Click Score: 6    End of Session Equipment Utilized During Treatment: Oxygen Activity Tolerance: Patient limited by fatigue Patient left: in bed;with call bell/phone within reach Nurse Communication: Mobility status PT Visit Diagnosis: Muscle weakness (generalized) (M62.81)     Time: 6967-8938 PT Time Calculation (min) (ACUTE ONLY): 38 min  Charges:  $Therapeutic Exercise: 8-22 mins $Therapeutic Activity: 23-37 mins                     Vickki Muff, PT, DPT   Acute Rehabilitation Department Pager #: [phone]   Ronnie Derby 06/11/2021, 5:20 PM

## 2021-06-11 NOTE — TOC Progression Note (Signed)
Transition of Care [facility]) - Progression Note    Patient Details  Name: Michael Payne MRN: [id number] Date of Birth: 03-Sep-1971  [facility] CM/SW Contact  Ivette Loyal, [place] Phone Number: 06/11/2021, 1:37 PM  Clinical Narrative:    CSW spoke with pt about not having any bed placements and some barriers that pt may have finding a SNF for rehab. Pt stated that he is fine with going home and thinks he has enough family support at home to go home with [facility]. CSW informed pt that since he has medicaid he can get additional [facility] services long term, pt stated he already knows about the program and refused resources. CSW will follow up with NCM for [facility] services when pt is medically stable fr DC.   Expected Discharge Plan: Skilled Nursing Facility Barriers to Discharge: Continued Medical Work up  Expected Discharge Plan and Services Expected Discharge Plan: Skilled Nursing Facility In-house Referral: Clinical Social Work Discharge Planning Services: CM Consult Post Acute Care Choice: Skilled Nursing Facility Living arrangements for the past 2 months: Single Family Home                           HH Arranged: RN, PT, OT HH Agency: [facility] Date [facility] Agency Contacted: 06/02/21 Time HH Agency Contacted: [phone] Representative spoke with at [facility] Agency: Hessie Knows   Social Determinants of Health (SDOH) Interventions    Readmission Risk Interventions No flowsheet data found.

## 2021-06-12 DIAGNOSIS — M1611 Unilateral primary osteoarthritis, right hip: Secondary | ICD-10-CM | POA: Diagnosis not present

## 2021-06-12 DIAGNOSIS — I5031 Acute diastolic (congestive) heart failure: Secondary | ICD-10-CM | POA: Diagnosis not present

## 2021-06-12 DIAGNOSIS — R0902 Hypoxemia: Secondary | ICD-10-CM | POA: Diagnosis not present

## 2021-06-12 DIAGNOSIS — I10 Essential (primary) hypertension: Secondary | ICD-10-CM | POA: Diagnosis not present

## 2021-06-12 LAB — BASIC METABOLIC PANEL
Anion gap: 8 (ref 5–15)
BUN: 48 mg/dL — ABNORMAL HIGH (ref 6–20)
CO2: 41 mmol/L — ABNORMAL HIGH (ref 22–32)
Calcium: 9.3 mg/dL (ref 8.9–10.3)
Chloride: 87 mmol/L — ABNORMAL LOW (ref 98–111)
Creatinine, Ser: 1.26 mg/dL — ABNORMAL HIGH (ref 0.61–1.24)
GFR, Estimated: >60 mL/min (ref >60)
Glucose, Bld: 124 mg/dL — ABNORMAL HIGH (ref 70–99)
Potassium: 3.2 mmol/L — ABNORMAL LOW (ref 3.5–5.1)
Sodium: 136 mmol/L (ref 135–145)

## 2021-06-12 MED ORDER — POTASSIUM CHLORIDE CRYS ER 20 MEQ PO TBCR
40.0000 meq | EXTENDED_RELEASE_TABLET | Freq: Once | ORAL | Status: AC
  Administered 2021-06-12: 40 meq via ORAL
  Filled 2021-06-12: qty 2

## 2021-06-12 MED ORDER — FUROSEMIDE 40 MG PO TABS: 40.0000 mg | ORAL_TABLET | Freq: Every day | ORAL | Status: DC

## 2021-06-12 NOTE — Progress Notes (Signed)
Patient wore CPAP from 2341-2352. States it's too cold on his face. HFNC at 5lpm reapplied.

## 2021-06-12 NOTE — Progress Notes (Signed)
PROGRESS NOTE    Michael Payne  ZOX:[id number] DOB: 1971/01/19 DOA: 05/31/2021 PCP: Joaquim Nam, MD    Brief Narrative:  [name] was admitted to the hospital with the working diagnosis of acute hypoxemic/hypercapnic respiratory failure due to heart failure exacerbation.   50 year old male past medical history for obesity class III, obstructive sleep apnea, hypertension, and ambulatory dysfunction, who presented with dyspnea.  Patient is bedbound, complained of feeling weak for several weeks, positive productive cough, positive orthopnea and rapidly progressing dyspnea.  On his initial physical examination blood pressure 164/101, heart rate 104, respirate 20, oxygen saturation 86%, he had scattered wheezing and rales bilaterally, increased work of breathing, heart S1-S2, present, rhythmic, abdomen protuberant, soft nontender, positive lower extremity edema.   Venous blood gas with a pH of 7.23, PCO2 98.1, PO2 78.6, sodium 138, potassium 4.6, chloride 96, bicarb 34, glucose 116, BUN 6, creatinine 0.69.  White count 10.0, hemoglobin 14.5, hematocrit 51.3, platelets 264. SARS COVID-19 negative.   Urinalysis Pacific gravity 1.006. Toxicology negative.   Chest radiograph with significant bilateral ischial infiltrates. CT chest with multifocal airspace disease bilateral lower lobes and right upper lobe. Moderate cardiomegaly.   EKG 94 bpm, left axis deviation, normal intervals, sinus rhythm, no significant ST segment or T wave changes.   Patient was placed on noninvasive mechanical ventilation, follow-up PCO2 greater than 100, after failing noninvasive mechanical ventilation he was intubated and admitted to the intensive care unit   Patient placed on aggressive diuresis for volume overload.  Liberated from mechanical ventilation on 09.19.22.    Transfer to [facility] on 09.21.     Patient continue to decline Bipap at night. Continue to have right hip pain. Bipap changed to Cpap to improve  compliance, but also declines.  Negative fluid balance was achieved with improvement in oxygen requirements.   Pending transfer to SNF.   Assessment & Plan:   Principal Problem:   Acute diastolic CHF (congestive heart failure) (HCC) Active Problems:   HTN (hypertension)   Severe obstructive sleep apnea   Bedbound   Hypoxia   Respiratory failure (HCC)   Pressure injury of skin   Pre-diabetes   Obesity, Class III, BMI 40-49.9 (morbid obesity) (HCC)   Osteoarthritis   Acute hypoxemic and hypercapnic respiratory failure, OHS, Obesity class 3.  Volume overload due to acute diastolic heart failure decompensation.  Echocardiogram with preserved LV and  RV systolic function Follow up chest radiograph 09/22 with improvement in interstitial infiltrates.  Continue to improve oxygenation with 02 sat 96% on 4 L/min per [place] Urine output over last 24 hrs 2525 ml.  Improved in volume status, will discontinue furosemide for now. Continue to wean off supplemental 02 during the day. If no cpap, continue with nocturnal 02 per [place] to discontinue telemetry.     2. HTN. Blood pressure control with amlodipine and carvedilol.    3. Pre- T2DM. Hgb A1c 5,9 Continue with metformin. Now off insulin therapy.    4. Right posterior thigh pressure ulcer stage 2. Not clear if present on admission. Continue local wound care.    5. Right hip osteoarthritis. Continue pain control with oxycodone and acetaminophen.  Pending transfer to SNF.    6. AKI, metabolic alkalosis Renal function with serum cr at 1,26 with K at 3,2 and serum bicarbonate at 41. Add 40 meq KCl and follow up renal function in am. Improved volume status, will discontinue diuresis    Status is: Inpatient  Remains inpatient appropriate because:Inpatient level  of care appropriate due to severity of illness  Dispo: The patient is from: Home              Anticipated d/c is to: SNF              Patient currently is not medically stable  to d/c.   Difficult to place patient No   DVT prophylaxis: Enoxaparin   Code Status:   Partial (no cpr and no defibrillation)  Family Communication:  No family at the bedside      Nutrition Status: Nutrition Problem: Increased nutrient needs Etiology: acute illness Signs/Symptoms: estimated needs Interventions: Premier Protein, MVI     Skin Documentation: Pressure Injury 06/04/21 Thigh Right;Posterior Stage 2 -  Partial thickness loss of dermis presenting as a shallow open injury with a red, pink wound bed without slough. Intact blister 1.5 cmX 1.5 cm (Active)  06/04/21 1000  Location: Thigh  Location Orientation: Right;Posterior  Staging: Stage 2 -  Partial thickness loss of dermis presenting as a shallow open injury with a red, pink wound bed without slough.  Wound Description (Comments): Intact blister 1.5 cmX 1.5 cm  Present on Admission:       Subjective: Patient is feeling better, right hip pain improved with oral analgesics, no nausea or vomiting, continue to decline cpap or bipap a night.   Objective: Vitals:   06/12/21 0600 06/12/21 0726 06/12/21 0813 06/12/21 1109  BP:  (!) 141/92  121/74  Pulse:  89  88  Resp:  13  14  Temp:  97.8 F (36.6 C)  98 F (36.7 C)  TempSrc:  Oral  Oral  SpO2: 99% 94% 98% 98%  Weight:      Height:        Intake/Output Summary (Last 24 hours) at 06/12/2021 1223 Last data filed at 06/12/2021 6269 Gross per 24 hour  Intake 727 ml  Output 1325 ml  Net -598 ml   Filed Weights   06/08/21 0500  Weight: (!) 271.7 kg    Examination:   General: Not in pain or dyspnea, deconditioned  Neurology: Awake and alert, non focal  E ENT: mild pallor, no icterus, oral mucosa moist Cardiovascular: No JVD. S1-S2 present, rhythmic, no gallops, rubs, or murmurs. Non pitting bilateral lower extremity edema. Pulmonary: positive breath sounds bilaterally, with no wheezing, rhonchi or rales. Gastrointestinal. Abdomen protuberant but not  tender Skin. No rashes Musculoskeletal: no joint deformities     Data Reviewed: I have personally reviewed following labs and imaging studies  CBC: Recent Labs  Lab 06/06/21 0219 06/07/21 0408 06/07/21 0446  WBC 6.9  --  8.2  NEUTROABS 4.5  --  5.5  HGB 12.7* 15.6 13.5  HCT 46.2 46.0 49.2  MCV 99.8  --  102.1*  PLT 269  --  284   Basic Metabolic Panel: Recent Labs  Lab 06/07/21 0446 06/08/21 0439 06/09/21 0500 06/10/21 0450 06/11/21 0059 06/12/21 0048  NA 146* 143 139 137 137 136  K 4.3 3.7 4.7 4.0 3.8 3.2*  CL 102 97* 92* 89* 90* 87*  CO2 32 35* 39* 40* 40* 41*  GLUCOSE 138* 122* 138* 131* 125* 124*  BUN 39* 37* 38* 41* 43* 48*  CREATININE 1.51* 1.17 1.40* 1.29* 1.21 1.26*  CALCIUM 9.2 9.0 9.1 9.0 8.9 9.3  MG 2.3 2.2 2.3 2.3 2.4  --    GFR: Estimated Creatinine Clearance: 154.4 mL/min (A) (by C-G formula based on SCr of 1.26 mg/dL (H)). Liver Function Tests: Recent  Labs  Lab 06/07/21 0446  AST 43*  ALT 53*  ALKPHOS 43  BILITOT 0.7  PROT 7.7  ALBUMIN 3.1*   No results for input(s): LIPASE, AMYLASE in the last 168 hours. No results for input(s): AMMONIA in the last 168 hours. Coagulation Profile: No results for input(s): INR, PROTIME in the last 168 hours. Cardiac Enzymes: No results for input(s): CKTOTAL, CKMB, CKMBINDEX, TROPONINI in the last 168 hours. BNP (last 3 results) Recent Labs    03/09/21 1232  PROBNP 23.0   HbA1C: No results for input(s): HGBA1C in the last 72 hours. CBG: Recent Labs  Lab 06/10/21 0005 06/10/21 0357 06/10/21 0759 06/10/21 1134 06/11/21 1041  GLUCAP 141* 131* 188* 130* 146*   Lipid Profile: No results for input(s): CHOL, HDL, LDLCALC, TRIG, CHOLHDL, LDLDIRECT in the last 72 hours. Thyroid Function Tests: No results for input(s): TSH, T4TOTAL, FREET4, T3FREE, THYROIDAB in the last 72 hours. Anemia Panel: No results for input(s): VITAMINB12, FOLATE, FERRITIN, TIBC, IRON, RETICCTPCT in the last 72  hours.    Radiology Studies: I have reviewed all of the imaging during this hospital visit personally     Scheduled Meds:  acetaminophen  650 mg Oral QHS   allopurinol  300 mg Oral BID   amLODipine  10 mg Oral Daily   carvedilol  3.125 mg Oral BID WC   Chlorhexidine Gluconate Cloth  6 each Topical Daily   docusate sodium  100 mg Oral BID   enoxaparin (LOVENOX) injection  100 mg Subcutaneous Q24H   furosemide  40 mg Oral BID   ipratropium-albuterol  3 mL Nebulization BID   metFORMIN  500 mg Oral Q breakfast   multivitamin with minerals  1 tablet Oral Daily   polyethylene glycol  17 g Oral Daily   Ensure Max Protein  11 oz Oral BID   sodium chloride flush  10-40 mL Intracatheter Q12H   Continuous Infusions:   LOS: 12 days        Daryus Sowash Annett Gula, MD

## 2021-06-12 NOTE — Progress Notes (Signed)
Unable to weigh patient. Sizewise bed scale not working. Patient unable to stand.

## 2021-06-12 NOTE — Progress Notes (Signed)
PT refused cpap 

## 2021-06-13 DIAGNOSIS — I10 Essential (primary) hypertension: Secondary | ICD-10-CM | POA: Diagnosis not present

## 2021-06-13 DIAGNOSIS — I5031 Acute diastolic (congestive) heart failure: Secondary | ICD-10-CM | POA: Diagnosis not present

## 2021-06-13 DIAGNOSIS — R0902 Hypoxemia: Secondary | ICD-10-CM | POA: Diagnosis not present

## 2021-06-13 LAB — BASIC METABOLIC PANEL
Anion gap: 9 (ref 5–15)
BUN: 50 mg/dL — ABNORMAL HIGH (ref 6–20)
CO2: 40 mmol/L — ABNORMAL HIGH (ref 22–32)
Calcium: 9.1 mg/dL (ref 8.9–10.3)
Chloride: 88 mmol/L — ABNORMAL LOW (ref 98–111)
Creatinine, Ser: 1.17 mg/dL (ref 0.61–1.24)
GFR, Estimated: >60 mL/min (ref >60)
Glucose, Bld: 122 mg/dL — ABNORMAL HIGH (ref 70–99)
Potassium: 3.5 mmol/L (ref 3.5–5.1)
Sodium: 137 mmol/L (ref 135–145)

## 2021-06-13 MED ORDER — SODIUM CHLORIDE 0.9% FLUSH
3.0000 mL | Freq: Two times a day (BID) | INTRAVENOUS | Status: DC
  Administered 2021-06-13 – 2021-06-27 (×28): 3 mL via INTRAVENOUS

## 2021-06-13 MED ORDER — DOCUSATE SODIUM 100 MG PO CAPS: 100.0000 mg | ORAL_CAPSULE | Freq: Every day | ORAL | Status: DC

## 2021-06-13 MED ORDER — POTASSIUM CHLORIDE CRYS ER 20 MEQ PO TBCR
40.0000 meq | EXTENDED_RELEASE_TABLET | Freq: Once | ORAL | Status: AC
  Administered 2021-06-13: 40 meq via ORAL
  Filled 2021-06-13: qty 2

## 2021-06-13 NOTE — Progress Notes (Signed)
Unable to weigh patient due to malfunctioning bed scale. Patient unable to stand.

## 2021-06-13 NOTE — Progress Notes (Addendum)
I spoke at length with Michael Payne wife about his current condition, including improvement in his heart failure decompensation.  He is using less than 5 L of supplemental oxygen per nasal cannula and no further IV medications. Clinically stable for discharge.  She mentions that she cannot take care of him at home any more, apparently patient failed home health services, with no improvement of his condition.  Patient is bedridden, and requires high nursing care that cannot be provided by home health services. His condition has been declining over the last 6 months and she considers him to be unsafe to stay at home by himself.   Patient requires special transportation to be taken to his appointments. He does have severe left hip osteoarthritis that cannot be operated on until he loses weight.

## 2021-06-13 NOTE — Progress Notes (Signed)
PROGRESS NOTE    Michael Payne  DDU:[id number] DOB: [birth year] DOA: 05/31/2021 PCP: Joaquim Nam, MD    Brief Narrative:  [name] was admitted to the hospital with the working diagnosis of acute hypoxemic/hypercapnic respiratory failure due to heart failure exacerbation.   50 year old male past medical history for obesity class III, obstructive sleep apnea, hypertension, and ambulatory dysfunction, who presented with dyspnea.  Patient is bedbound, complained of feeling weak for several weeks, positive productive cough, positive orthopnea and rapidly progressing dyspnea.  On his initial physical examination blood pressure 164/101, heart rate 104, respirate 20, oxygen saturation 86%, he had scattered wheezing and rales bilaterally, increased work of breathing, heart S1-S2, present, rhythmic, abdomen protuberant, soft nontender, positive lower extremity edema.   Venous blood gas with a pH of 7.23, PCO2 98.1, PO2 78.6, sodium 138, potassium 4.6, chloride 96, bicarb 34, glucose 116, BUN 6, creatinine 0.69.  White count 10.0, hemoglobin 14.5, hematocrit 51.3, platelets 264. SARS COVID-19 negative.   Urinalysis [place] gravity 1.006. Toxicology negative.   Chest radiograph with significant bilateral ischial infiltrates. CT chest with multifocal airspace disease bilateral lower lobes and right upper lobe. Moderate cardiomegaly.   EKG 94 bpm, left axis deviation, normal intervals, sinus rhythm, no significant ST segment or T wave changes.   Patient was placed on noninvasive mechanical ventilation, follow-up PCO2 greater than 100, after failing noninvasive mechanical ventilation he was intubated and admitted to the intensive care unit   Patient placed on aggressive diuresis for volume overload.  Liberated from mechanical ventilation on 09.19.22.    Transfer to [facility] on 09.21.     Patient continue to decline Bipap at night. Continue to have right hip pain. Bipap changed to Cpap to improve  compliance, but also declines.  Negative fluid balance was achieved with improvement in oxygen requirements.    No SNF available, patient using 4 L/min per [place] with oxygen saturation 92% or greater.  Plan for home health and follow up as outpatient.    Assessment & Plan:   Principal Problem:   Acute diastolic CHF (congestive heart failure) (HCC) Active Problems:   HTN (hypertension)   Severe obstructive sleep apnea   Bedbound   Hypoxia   Respiratory failure (HCC)   Pressure injury of skin   Pre-diabetes   Obesity, Class III, BMI 40-49.9 (morbid obesity) (HCC)   Osteoarthritis     Acute hypoxemic and hypercapnic respiratory failure, OHS, Obesity class 3.  Volume overload due to acute diastolic heart failure decompensation.  Echocardiogram with preserved LV and  RV systolic function Follow up chest radiograph 09/22 with improvement in interstitial infiltrates.   Oxygenation with 02 sat 96% on 4 L/min per [place] Patient clinically euvolemic.  Plan to continue to hold on furosemide for now. Continue oxymetry monitoring.  Check home 02 screen and do an overnight oxymetry. Patient continue to decline Cpap and Bipap.  No SNF available, will plan to dc home with home health services.  Patient needs aggressive weight reduction, suspected pulmonary hypertension.  Discontinue foley cathter.     2. HTN.  amlodipine and carvedilol for blood pressure control.    3. Pre- T2DM. Hgb A1c 5,9 On metformin.  No insulin therapy.    4. Right posterior thigh pressure ulcer stage 2. Not clear if present on admission. Continue local wound care.    5. Right hip osteoarthritis. On oxycodone and acetaminophen for pain control.    6. AKI, metabolic alkalosis Renal function with serum cr at 1,17 with  K at 3,5 and serum bicarbonate at 40. Continue to hold on furosemide and use as needed.    Status is: Inpatient  Remains inpatient appropriate because:Hemodynamically unstable and Inpatient level  of care appropriate due to severity of illness  Dispo: The patient is from: Home              Anticipated d/c is to: Home              Patient currently is medically stable to d/c.   Difficult to place patient No  DVT prophylaxis: Enoxaparin   Code Status:    full  Family Communication:    Not able to reach his wife over the phone, left a message.     Nutrition Status: Nutrition Problem: Increased nutrient needs Etiology: acute illness Signs/Symptoms: estimated needs Interventions: Premier Protein, MVI     Skin Documentation: Pressure Injury 06/04/21 Thigh Right;Posterior Stage 2 -  Partial thickness loss of dermis presenting as a shallow open injury with a red, pink wound bed without slough. Intact blister 1.5 cmX 1.5 cm (Active)  06/04/21 1000  Location: Thigh  Location Orientation: Right;Posterior  Staging: Stage 2 -  Partial thickness loss of dermis presenting as a shallow open injury with a red, pink wound bed without slough.  Wound Description (Comments): Intact blister 1.5 cmX 1.5 cm  Present on Admission:       Subjective: Patient is feeling well, dyspnea is close to baseline, no nausea or vomiting, his right hip pain is controlled with oral analgesics.   Objective: Vitals:   06/12/21 1905 06/12/21 2326 06/13/21 0322 06/13/21 0754  BP: 101/67 116/68 136/77 126/89  Pulse: 89 91 86 86  Resp: 15 17 17    Temp: 97.9 F (36.6 C) 98 F (36.7 C) 97.7 F (36.5 C) (!) 97.5 F (36.4 C)  TempSrc: Oral Oral Oral   SpO2: 94% 94% 99%   Height:        Intake/Output Summary (Last 24 hours) at 06/13/2021 1027 Last data filed at 06/13/2021 06/15/2021 Gross per 24 hour  Intake 720 ml  Output 1815 ml  Net -1095 ml   Filed Weights    Examination:   General: Not in pain or dyspnea  Neurology: Awake and alert, non focal  E ENT: no pallor, no icterus, oral mucosa moist Cardiovascular: No JVD. S1-S2 present, rhythmic, no gallops, rubs, or murmurs. No lower extremity  edema. Pulmonary: positive breath sounds bilaterally,  no rhonchi or rales. Gastrointestinal. Abdomen protuberant  Skin. No rashes Musculoskeletal: no joint deformities     Data Reviewed: I have personally reviewed following labs and imaging studies  CBC: Recent Labs  Lab 06/07/21 0408 06/07/21 0446  WBC  --  8.2  NEUTROABS  --  5.5  HGB 15.6 13.5  HCT 46.0 49.2  MCV  --  102.1*  PLT  --  284   Basic Metabolic Panel: Recent Labs  Lab 06/07/21 0446 06/08/21 0439 06/09/21 0500 06/10/21 0450 06/11/21 0059 06/12/21 0048 06/13/21 0048  NA 146* 143 139 137 137 136 137  K 4.3 3.7 4.7 4.0 3.8 3.2* 3.5  CL 102 97* 92* 89* 90* 87* 88*  CO2 32 35* 39* 40* 40* 41* 40*  GLUCOSE 138* 122* 138* 131* 125* 124* 122*  BUN 39* 37* 38* 41* 43* 48* 50*  CREATININE 1.51* 1.17 1.40* 1.29* 1.21 1.26* 1.17  CALCIUM 9.2 9.0 9.1 9.0 8.9 9.3 9.1  MG 2.3 2.2 2.3 2.3 2.4  --   --  GFR: Estimated Creatinine Clearance: 166.3 mL/min (by C-G formula based on SCr of 1.17 mg/dL). Liver Function Tests: Recent Labs  Lab 06/07/21 0446  AST 43*  ALT 53*  ALKPHOS 43  BILITOT 0.7  PROT 7.7  ALBUMIN 3.1*   No results for input(s): LIPASE, AMYLASE in the last 168 hours. No results for input(s): AMMONIA in the last 168 hours. Coagulation Profile: No results for input(s): INR, PROTIME in the last 168 hours. Cardiac Enzymes: No results for input(s): CKTOTAL, CKMB, CKMBINDEX, TROPONINI in the last 168 hours. BNP (last 3 results) Recent Labs    03/09/21 1232  PROBNP 23.0   HbA1C: No results for input(s): HGBA1C in the last 72 hours. CBG: Recent Labs  Lab 06/10/21 0005 06/10/21 0357 06/10/21 0759 06/10/21 1134 06/11/21 1041  GLUCAP 141* 131* 188* 130* 146*   Lipid Profile: No results for input(s): CHOL, HDL, LDLCALC, TRIG, CHOLHDL, LDLDIRECT in the last 72 hours. Thyroid Function Tests: No results for input(s): TSH, T4TOTAL, FREET4, T3FREE, THYROIDAB in the last 72 hours. Anemia  Panel: No results for input(s): VITAMINB12, FOLATE, FERRITIN, TIBC, IRON, RETICCTPCT in the last 72 hours.    Radiology Studies: I have reviewed all of the imaging during this hospital visit personally     Scheduled Meds:  acetaminophen  650 mg Oral QHS   allopurinol  300 mg Oral BID   amLODipine  10 mg Oral Daily   carvedilol  3.125 mg Oral BID WC   Chlorhexidine Gluconate Cloth  6 each Topical Daily   docusate sodium  100 mg Oral BID   enoxaparin (LOVENOX) injection  100 mg Subcutaneous Q24H   metFORMIN  500 mg Oral Q breakfast   multivitamin with minerals  1 tablet Oral Daily   polyethylene glycol  17 g Oral Daily   Ensure Max Protein  11 oz Oral BID   sodium chloride flush  10-40 mL Intracatheter Q12H   Continuous Infusions:   LOS: 13 days        Collie Kittel Annett Gula, MD

## 2021-06-13 NOTE — Progress Notes (Signed)
Wife was concerned today regarding she can't take her husband home, Dr. Ella Jubilee paged and he talked to the pt's wife in person.  They are refusing to take out Foley this time, I talked to the patient and wife about the long possible complications of Foley but they refused to take it off since they are not sure when they are heading to the next place either SNF or rehab. They want to wait till then. Informed to Dr. Ella Jubilee.  Will continue to monitor the patient  Lonia Farber, RN

## 2021-06-14 DIAGNOSIS — M1611 Unilateral primary osteoarthritis, right hip: Secondary | ICD-10-CM | POA: Diagnosis not present

## 2021-06-14 DIAGNOSIS — G4733 Obstructive sleep apnea (adult) (pediatric): Secondary | ICD-10-CM

## 2021-06-14 DIAGNOSIS — I5031 Acute diastolic (congestive) heart failure: Secondary | ICD-10-CM | POA: Diagnosis not present

## 2021-06-14 DIAGNOSIS — I10 Essential (primary) hypertension: Secondary | ICD-10-CM | POA: Diagnosis not present

## 2021-06-14 LAB — GLUCOSE, CAPILLARY
Glucose-Capillary: 129 mg/dL — ABNORMAL HIGH (ref 70–99)
Glucose-Capillary: 106 mg/dL — ABNORMAL HIGH (ref 70–99)

## 2021-06-14 LAB — BASIC METABOLIC PANEL
Anion gap: 6 (ref 5–15)
BUN: 39 mg/dL — ABNORMAL HIGH (ref 6–20)
CO2: 41 mmol/L — ABNORMAL HIGH (ref 22–32)
Calcium: 8.9 mg/dL (ref 8.9–10.3)
Chloride: 89 mmol/L — ABNORMAL LOW (ref 98–111)
Creatinine, Ser: 1.18 mg/dL (ref 0.61–1.24)
GFR, Estimated: >60 mL/min (ref >60)
Glucose, Bld: 116 mg/dL — ABNORMAL HIGH (ref 70–99)
Potassium: 3.8 mmol/L (ref 3.5–5.1)
Sodium: 136 mmol/L (ref 135–145)

## 2021-06-14 LAB — CBC
HCT: 41.5 % (ref 39.0–52.0)
Hemoglobin: 12.1 g/dL — ABNORMAL LOW (ref 13.0–17.0)
MCH: 27.8 pg (ref 26.0–34.0)
MCHC: 29.2 g/dL — ABNORMAL LOW (ref 30.0–36.0)
MCV: 95.4 fL (ref 80.0–100.0)
Platelets: 260 10*3/uL (ref 150–400)
RBC: 4.35 MIL/uL (ref 4.22–5.81)
RDW: 14.9 % (ref 11.5–15.5)
WBC: 6.3 10*3/uL (ref 4.0–10.5)
nRBC: 0 % (ref 0.0–0.2)

## 2021-06-14 MED ORDER — FUROSEMIDE 40 MG PO TABS
40.0000 mg | ORAL_TABLET | Freq: Every day | ORAL | Status: DC
  Administered 2021-06-14 – 2021-06-28 (×15): 40 mg via ORAL
  Filled 2021-06-14 (×15): qty 1

## 2021-06-14 MED ORDER — POTASSIUM CHLORIDE CRYS ER 20 MEQ PO TBCR
40.0000 meq | EXTENDED_RELEASE_TABLET | Freq: Once | ORAL | Status: AC
  Administered 2021-06-14: 40 meq via ORAL
  Filled 2021-06-14: qty 2

## 2021-06-14 NOTE — Plan of Care (Signed)

## 2021-06-14 NOTE — Progress Notes (Signed)
PROGRESS NOTE    Michael Payne  HYQ:[id number] DOB: 02-14-1971 DOA: 05/31/2021 PCP: Joaquim Nam, MD    Brief Narrative:  Mr. Bramblett was admitted to the hospital with the working diagnosis of acute hypoxemic/hypercapnic respiratory failure due to heart failure exacerbation.   50 year old male past medical history for obesity class III, obstructive sleep apnea, hypertension, and ambulatory dysfunction, who presented with dyspnea.  Patient is bedbound, complained of feeling weak for several weeks, positive productive cough, positive orthopnea and rapidly progressing dyspnea.  On his initial physical examination blood pressure 164/101, heart rate 104, respirate 20, oxygen saturation 86%, he had scattered wheezing and rales bilaterally, increased work of breathing, heart S1-S2, present, rhythmic, abdomen protuberant, soft nontender, positive lower extremity edema.   Venous blood gas with a pH of 7.23, PCO2 98.1, PO2 78.6, sodium 138, potassium 4.6, chloride 96, bicarb 34, glucose 116, BUN 6, creatinine 0.69.  White count 10.0, hemoglobin 14.5, hematocrit 51.3, platelets 264. SARS COVID-19 negative.   Urinalysis Pacific gravity 1.006. Toxicology negative.   Chest radiograph with significant bilateral ischial infiltrates. CT chest with multifocal airspace disease bilateral lower lobes and right upper lobe. Moderate cardiomegaly.   EKG 94 bpm, left axis deviation, normal intervals, sinus rhythm, no significant ST segment or T wave changes.   Patient was placed on noninvasive mechanical ventilation, follow-up PCO2 greater than 100, after failing noninvasive mechanical ventilation he was intubated and admitted to the intensive care unit   Patient placed on aggressive diuresis for volume overload.  Liberated from mechanical ventilation on 09.19.22.    Transfer to [facility] on 09.21.     Patient continue to decline Bipap at night. Continue to have right hip pain. Bipap changed to Cpap to improve  compliance, but also declines.  Negative fluid balance was achieved with improvement in oxygen requirements.    No SNF available, patient using 4 L/min per [place] with oxygen saturation 92% or greater.  Plan for home health and follow up as outpatient.   Transition of care has been consulted, his wife is concerned about patient returning home.    Assessment & Plan:   Principal Problem:   Acute diastolic CHF (congestive heart failure) (HCC) Active Problems:   HTN (hypertension)   Severe obstructive sleep apnea   Bedbound   Hypoxia   Respiratory failure (HCC)   Pressure injury of skin   Pre-diabetes   Obesity, Class III, BMI 40-49.9 (morbid obesity) (HCC)   Osteoarthritis     Acute hypoxemic and hypercapnic respiratory failure, OHS, Obesity class 3.  Volume overload due to acute diastolic heart failure decompensation.  Echocardiogram with preserved LV and  RV systolic function Follow up chest radiograph 09/22 with improvement in interstitial infiltrates.   His oxygenation saturation has been 96% on 4 L/min per [place] Overnight oxymetry on room air has been ordered, patient likely will need home 02. He is non compliant with Cpap.  He declined to have his foley cathter taken out yesterday.   Will resume fuorsemide 40 mg daily today to keep a negative fluid balance.     2. HTN.  amlodipine and carvedilol for blood pressure control.    3. Pre- T2DM. Hgb A1c 5,9 Continue with metformin.  No needing insulin therapy.    4. Right posterior thigh pressure ulcer stage 2. Not clear if present on admission. Continue local wound care.    5. Right hip osteoarthritis. Continue with oxycodone and acetaminophen for pain control.    6. AKI, metabolic alkalosis Resume  furosemide today 40 mg po daily, his renal function today shows serum ct at 1,18 with K at 3,8 and serum bicarbonate at 41. Add 40 meq Kcl today and follow up renal panel in am.    Status is: Inpatient  Remains inpatient  appropriate because:Inpatient level of care appropriate due to severity of illness  Dispo: The patient is from: Home              Anticipated d/c is to: Home consulted TOC for discharge planning, patient wants to go home but his wife would like him to go to SNF.               Patient currently is medically stable to d/c.   Difficult to place patient No       DVT prophylaxis: Enoxaparin   Code Status:    full  Family Communication:   No family at the bedside      Nutrition Status: Nutrition Problem: Increased nutrient needs Etiology: acute illness Signs/Symptoms: estimated needs Interventions: Premier Protein, MVI     Skin Documentation: Pressure Injury 06/04/21 Thigh Right;Posterior Stage 2 -  Partial thickness loss of dermis presenting as a shallow open injury with a red, pink wound bed without slough. Intact blister 1.5 cmX 1.5 cm (Active)  06/04/21 1000  Location: Thigh  Location Orientation: Right;Posterior  Staging: Stage 2 -  Partial thickness loss of dermis presenting as a shallow open injury with a red, pink wound bed without slough.  Wound Description (Comments): Intact blister 1.5 cmX 1.5 cm  Present on Admission:      Subjective: Patient with no nausea or vomiting, no chest pain or dyspnea, this am with lower extremity edema. Hip pain is controlled with analgesics   Objective: Vitals:   06/13/21 2015 06/13/21 2333 06/14/21 0324 06/14/21 0752  BP: 112/66 133/83 123/81 129/75  Pulse: 85 91 97 92  Resp: 16 17 19 18   Temp: 97.8 F (36.6 C) 98 F (36.7 C) 98 F (36.7 C) 97.7 F (36.5 C)  TempSrc: Oral Oral Oral Oral  SpO2: 95% 90% 94% 96%  Height:        Intake/Output Summary (Last 24 hours) at 06/14/2021 0954 Last data filed at 06/14/2021 0327 Gross per 24 hour  Intake 240 ml  Output 1450 ml  Net -1210 ml   Filed Weights    Examination:   General: Not in pain or dyspnea.  Neurology: Awake and alert, non focal  E ENT: no pallor, no icterus,  oral mucosa moist Cardiovascular: No JVD. S1-S2 present, rhythmic, no gallops, rubs, or murmurs. Trace pitting + bilateral lower extremity edema. Pulmonary: positive breath sounds bilaterally, with no wheezing, rhonchi or rales. Gastrointestinal. Abdomen protuberant but not tender Skin. No rashes Musculoskeletal: no joint deformities     Data Reviewed: I have personally reviewed following labs and imaging studies  CBC: Recent Labs  Lab 06/14/21 0021  WBC 6.3  HGB 12.1*  HCT 41.5  MCV 95.4  PLT 260   Basic Metabolic Panel: Recent Labs  Lab 06/08/21 0439 06/09/21 0500 06/10/21 0450 06/11/21 0059 06/12/21 0048 06/13/21 0048 06/14/21 0021  NA 143 139 137 137 136 137 136  K 3.7 4.7 4.0 3.8 3.2* 3.5 3.8  CL 97* 92* 89* 90* 87* 88* 89*  CO2 35* 39* 40* 40* 41* 40* 41*  GLUCOSE 122* 138* 131* 125* 124* 122* 116*  BUN 37* 38* 41* 43* 48* 50* 39*  CREATININE 1.17 1.40* 1.29* 1.21 1.26* 1.17 1.18  CALCIUM 9.0 9.1 9.0 8.9 9.3 9.1 8.9  MG 2.2 2.3 2.3 2.4  --   --   --    GFR: Estimated Creatinine Clearance: 164.8 mL/min (by C-G formula based on SCr of 1.18 mg/dL). Liver Function Tests: No results for input(s): AST, ALT, ALKPHOS, BILITOT, PROT, ALBUMIN in the last 168 hours. No results for input(s): LIPASE, AMYLASE in the last 168 hours. No results for input(s): AMMONIA in the last 168 hours. Coagulation Profile: No results for input(s): INR, PROTIME in the last 168 hours. Cardiac Enzymes: No results for input(s): CKTOTAL, CKMB, CKMBINDEX, TROPONINI in the last 168 hours. BNP (last 3 results) Recent Labs    03/09/21 1232  PROBNP 23.0   HbA1C: No results for input(s): HGBA1C in the last 72 hours. CBG: Recent Labs  Lab 06/10/21 0005 06/10/21 0357 06/10/21 0759 06/10/21 1134 06/11/21 1041  GLUCAP 141* 131* 188* 130* 146*   Lipid Profile: No results for input(s): CHOL, HDL, LDLCALC, TRIG, CHOLHDL, LDLDIRECT in the last 72 hours. Thyroid Function Tests: No  results for input(s): TSH, T4TOTAL, FREET4, T3FREE, THYROIDAB in the last 72 hours. Anemia Panel: No results for input(s): VITAMINB12, FOLATE, FERRITIN, TIBC, IRON, RETICCTPCT in the last 72 hours.    Radiology Studies: I have reviewed all of the imaging during this hospital visit personally     Scheduled Meds:  acetaminophen  650 mg Oral QHS   allopurinol  300 mg Oral BID   amLODipine  10 mg Oral Daily   carvedilol  3.125 mg Oral BID WC   Chlorhexidine Gluconate Cloth  6 each Topical Daily   docusate sodium  100 mg Oral QHS   enoxaparin (LOVENOX) injection  100 mg Subcutaneous Q24H   furosemide  40 mg Oral Daily   metFORMIN  500 mg Oral Q breakfast   multivitamin with minerals  1 tablet Oral Daily   Ensure Max Protein  11 oz Oral BID   sodium chloride flush  3 mL Intravenous Q12H   Continuous Infusions:   LOS: 14 days        Linn Goetze Annett Gula, MD

## 2021-06-14 NOTE — TOC Progression Note (Signed)
Transition of Care [facility]) - Progression Note    Patient Details  Name: Michael Payne MRN: [id number] Date of Birth: March 07, 1971  Transition [facility] CM/SW Contact  Leone Haven, RN Phone Number: 06/14/2021, 2:35 PM  Clinical Narrative:    NCM was notified that patient does not want to go to SNF and wants to go home with [facility].  NCM spoke with patient at the bedside, offered choice, he states he had [facility] and would like to continue with [facility].  NCM made referral to [facility] for [facility] for cardiopulmonary disease management, HHPT, HHAIDE and Social Work.  MD states patient will also need home oxygen.  NCM spoke with patient about needing home oxygen, he states he is ok with [employer] supplying his oxygen for him.  NCM made referral to [facility] with [employer] for the home oxygen.  Patient has hospital bed with trapeze bar, hoyer lift, w/chair, and walker.  He has caring [facility] aide from 9:30 to 12:30 Mon- Sunday to help him with bathing and dressing.  He states his daughter [name] will be working from home and will assist him with his food and any other needs he may have.  He will need ambulance transport home at discharge.    Expected Discharge Plan: Home w Home Health Services Barriers to Discharge: Continued Medical Work up  Expected Discharge Plan and Services Expected Discharge Plan: Home w Home Health Services In-house Referral: Clinical Social Work Discharge Planning Services: CM Consult Post Acute Care Choice: Home Health Living arrangements for the past 2 months: Single Family Home                 DME Arranged: Oxygen DME Agency: [employer] Date DME Agency Contacted: 06/14/21 Time DME Agency Contacted: 1434 Representative spoke with at DME Agency: Ian Malkin HH Arranged: RN, Disease Management, PT, Nurse's Aide, Social Work [employer] Agency: [facility] Date [facility] Agency Contacted: 06/14/21 Time HH Agency Contacted: 1434 Representative spoke with at [facility] Agency: Hessie Knows   Social Determinants of Health (SDOH) Interventions    Readmission Risk Interventions No flowsheet data found.

## 2021-06-15 ENCOUNTER — Telehealth: Payer: Medicare HMO

## 2021-06-15 ENCOUNTER — Other Ambulatory Visit: Payer: Self-pay | Admitting: Family Medicine

## 2021-06-15 DIAGNOSIS — Z7401 Bed confinement status: Secondary | ICD-10-CM | POA: Diagnosis not present

## 2021-06-15 DIAGNOSIS — R7303 Prediabetes: Secondary | ICD-10-CM

## 2021-06-15 DIAGNOSIS — R0902 Hypoxemia: Secondary | ICD-10-CM | POA: Diagnosis not present

## 2021-06-15 DIAGNOSIS — I5031 Acute diastolic (congestive) heart failure: Secondary | ICD-10-CM | POA: Diagnosis not present

## 2021-06-15 LAB — BASIC METABOLIC PANEL
Anion gap: 9 (ref 5–15)
BUN: 33 mg/dL — ABNORMAL HIGH (ref 6–20)
CO2: 38 mmol/L — ABNORMAL HIGH (ref 22–32)
Calcium: 9.1 mg/dL (ref 8.9–10.3)
Chloride: 88 mmol/L — ABNORMAL LOW (ref 98–111)
Creatinine, Ser: 0.89 mg/dL (ref 0.61–1.24)
GFR, Estimated: >60 mL/min (ref >60)
Glucose, Bld: 112 mg/dL — ABNORMAL HIGH (ref 70–99)
Potassium: 4.3 mmol/L (ref 3.5–5.1)
Sodium: 135 mmol/L (ref 135–145)

## 2021-06-15 MED ORDER — POLYETHYLENE GLYCOL 3350 17 G PO PACK: 17.0000 g | PACK | Freq: Every day | ORAL | 0 refills | Status: DC | PRN

## 2021-06-15 MED ORDER — POTASSIUM CHLORIDE CRYS ER 10 MEQ PO TBCR
10.0000 meq | EXTENDED_RELEASE_TABLET | Freq: Every day | ORAL | Status: DC
  Administered 2021-06-16 – 2021-06-28 (×13): 10 meq via ORAL
  Filled 2021-06-15 (×13): qty 1

## 2021-06-15 MED ORDER — LOPERAMIDE HCL 2 MG PO CAPS
4.0000 mg | ORAL_CAPSULE | Freq: Once | ORAL | Status: AC
  Administered 2021-06-16: 2 mg via ORAL
  Filled 2021-06-15: qty 2

## 2021-06-15 MED ORDER — POTASSIUM CHLORIDE CRYS ER 10 MEQ PO TBCR: 10.0000 meq | EXTENDED_RELEASE_TABLET | Freq: Every day | ORAL | 0 refills | Status: DC

## 2021-06-15 MED ORDER — ENSURE MAX PROTEIN PO LIQD: 11.0000 [oz_av] | Freq: Two times a day (BID) | ORAL | 0 refills | Status: DC

## 2021-06-15 MED ORDER — CARVEDILOL 3.125 MG PO TABS
3.1250 mg | ORAL_TABLET | Freq: Two times a day (BID) | ORAL | 0 refills | Status: DC
Start: 2021-06-15 — End: 2021-07-29

## 2021-06-15 MED ORDER — FUROSEMIDE 40 MG PO TABS: 40.0000 mg | ORAL_TABLET | Freq: Every day | ORAL | 0 refills | Status: DC

## 2021-06-15 NOTE — Progress Notes (Signed)
Physical Therapy Treatment Patient Details Name: Michael Payne MRN: [id number] DOB: 10-Feb-1971 Today's Date: 06/15/2021   History of Present Illness Pt is a 50 y.o. male admitted 05/31/21 with SOB, cough, orthopnea. Workup for acute on chronic hypoxemic and hypercarbic respiratory failure. ETT 9/13-9/19. PMH includes morbid obesity, DM2, OSA (non compliant CPAP), HTN, chronic narcotic use for LE pain, bedbound x 2 years.    PT Comments    Worked on rolling to assist with pericare. Pt continues to require +2 assist with rolling. Pt with long term immobility (>2 years bed bound) so will continue to focus on bed mobility.    Recommendations for follow up therapy are one component of a multi-disciplinary discharge planning process, led by the attending physician.  Recommendations may be updated based on patient status, additional functional criteria and insurance authorization.  Follow Up Recommendations  Supervision for mobility/OOB     Equipment Recommendations  None recommended by PT    Recommendations for Other Services       Precautions / Restrictions Precautions Precautions: Fall;Other (comment) Precaution Comments: Has been bedbound for ~2 years; chronic R hip pain     Mobility  Bed Mobility Overal bed mobility: Needs Assistance Bed Mobility: Rolling Rolling: +2 for physical assistance;Mod assist         General bed mobility comments: Assist to bring LE across body and to assist UE across to rail where he can use it to pull over to side. Nursing staff present for pericare. Pt able to maintain sidelying on lt side.    Transfers                    Ambulation/Gait                 Stairs             Wheelchair Mobility    Modified Rankin (Stroke Patients Only)       Balance                                            Cognition Arousal/Alertness: Awake/alert Behavior During Therapy: WFL for tasks  assessed/performed Overall Cognitive Status: Within Functional Limits for tasks assessed                                        Exercises      General Comments        Pertinent Vitals/Pain Pain Assessment: Faces Faces Pain Scale: Hurts even more Pain Location: R hip Pain Descriptors / Indicators: Grimacing;Guarding    Home Living                      Prior Function            PT Goals (current goals can now be found in the care plan section) Progress towards PT goals: Progressing toward goals    Frequency    Min 2X/week      PT Plan Current plan remains appropriate    Co-evaluation              AM-PAC PT "6 Clicks" Mobility   Outcome Measure  Help needed turning from your back to your side while in a flat bed without using bedrails?: Total Help needed moving from lying  on your back to sitting on the side of a flat bed without using bedrails?: Total Help needed moving to and from a bed to a chair (including a wheelchair)?: Total Help needed standing up from a chair using your arms (e.g., wheelchair or bedside chair)?: Total Help needed to walk in hospital room?: Total Help needed climbing 3-5 steps with a railing? : Total 6 Click Score: 6    End of Session   Activity Tolerance: Patient tolerated treatment well Patient left: in bed;with call bell/phone within reach   PT Visit Diagnosis: Other abnormalities of gait and mobility (R26.89)     Time: 8338-2505 PT Time Calculation (min) (ACUTE ONLY): 13 min  Charges:  $Therapeutic Activity: 8-22 mins                     [facility] PT Acute Rehabilitation Services Pager [phone] Office [phone]    Angelina Ok [facility] 06/15/2021, 2:08 PM

## 2021-06-15 NOTE — TOC Progression Note (Addendum)
Transition of Care [facility]) - Progression Note    Patient Details  Name: Michael Payne MRN: [id number] Date of Birth: April 23, 1971  Transition of Care [facility]) CM/SW Contact  Michael Haven, RN Phone Number: 06/15/2021, 12:16 PM  Clinical Narrative:    NCM spoke with patient this am about going home, he states he spoke with his daughter.  NCM asked if he notified his wife , he states he has not, NCM called the wife to notify her that he will be dc today and she says she will not be at the house so how is he coming home.  NCM informed her that he said the daughter will be there and will be assisting him.  NCM is going to contact daughter after meeting , when NCM just finish meeting the daughter had called another NCM  that was previously working with the patient and this NCM called her back to discuss patient's return home today.  She states the patient has not called her and would not pick up the phone when she called him.  She states she is very frustrated with this and that she works from home during the day, and if someone comes to the door while she Is working she will not be able to answer the door.  She would like for him to come home after 5 pm today.  This NCM arranged ptar for 5 pm transport and also informed the Staff RN.    9/27- NCM received a call from the daughter stating she can not care for patient at home if he does not have 24 hr care.   She states they live in an older house and the resp person informed her that plugging up the oxygen can blow a fuse which will cause the oxygen to go out and then he will be without oxygn, she does not feel comfortable working the oxygen or the hoyer lift.  NCM will cancel the ptar transport and inform MD of this information. NCM informed CSW to see if she could get long term placement In a SNF.  9/27 16:13- I just spoke with patient , he states he has a family friend that is going to take care of him for 24 hrs, her name is Michael Payne [phone] .  I spoke to her she plans to move to [facility] and have a place by Friday and move him in with her and take care of him.  she states she will call me by Friday or before FRiday to let me know the day she is ready for him to be discharged, will notify MD of this information.  The oxygen will have to be taken to a new address also.   Expected Discharge Plan: Home w Home Health Services Barriers to Discharge: Continued Medical Work up  Expected Discharge Plan and Services Expected Discharge Plan: Home w Home Health Services In-house Referral: Clinical Social Work Discharge Planning Services: CM Consult Post Acute Care Choice: Home Health Living arrangements for the past 2 months: Single Family Home Expected Discharge Date: 06/15/21               DME Arranged: Oxygen DME Agency: [employer] Date DME Agency Contacted: 06/14/21 Time DME Agency Contacted: 1434 Representative spoke with at DME Agency: Ian Malkin HH Arranged: RN, Disease Management, PT, Nurse's Aide, Social Work [employer] Agency: [facility] Date [facility] Agency Contacted: 06/14/21 Time HH Agency Contacted: 1434 Representative spoke with at [facility] Agency: Hessie Knows  Social Determinants of Health (SDOH) Interventions    Readmission Risk Interventions No flowsheet data found.

## 2021-06-15 NOTE — Discharge Summary (Signed)
Physician Discharge Summary  DREDEN RIVERE WRU:[id number] DOB: 05/29/1971 DOA: 05/31/2021  PCP: [name], MD  Admit date: 05/31/2021 Discharge date: 06/15/2021  Admitted From: home  Disposition:  home   Recommendations for Outpatient Follow-up and new medication changes:  Follow up with Dr. Damita Dunnings in 7 to 10 days.  Added furosemide 40 mg daily.  Follow up with Dr Thermon Leyland when discharged from the hospital for bariatric surgery.  Added home 02  Encourage to use home Cpap.   I spoke over the phone with the patient's wife about patient's  condition, plan of care, prognosis and all questions were addressed.   Home Health: yes   Equipment/Devices: home 02    Discharge Condition: stable  CODE STATUS: full  Diet recommendation: heart healthy.   Brief/Interim Summary: Mr. Grandfield was admitted to the hospital with the working diagnosis of acute hypoxemic/hypercapnic respiratory failure due to heart failure exacerbation.   50 year old male past medical history for obesity class III, obstructive sleep apnea, hypertension, and ambulatory dysfunction, who presented with dyspnea.  Patient is bed bound due to obesity and osteoarthritis , complained of feeling weak for several weeks, positive productive cough, positive orthopnea and rapidly progressing dyspnea.  On his initial physical examination blood pressure 164/101, heart rate 104, respiratory rate 20, oxygen saturation 86%, he had scattered wheezing and rales bilaterally, increased work of breathing, heart S1-S2, present, rhythmic, abdomen protuberant, soft nontender, positive lower extremity edema.   Venous blood gas with a pH of 7.23, PCO2 98.1, PO2 78.6, sodium 138, potassium 4.6, chloride 96, bicarb 34, glucose 116, BUN 6, creatinine 0.69.  White count 10.0, hemoglobin 14.5, hematocrit 51.3, platelets 264. SARS COVID-19 negative.   Urinalysis [place] gravity 1.006. Toxicology negative.   Chest radiograph with significant  bilateral interstitial infiltrates. CT chest with multifocal airspace disease bilateral lower lobes and right upper lobe. Moderate cardiomegaly.   EKG 94 bpm, left axis deviation, normal intervals, sinus rhythm, no significant ST segment or T wave changes.   Patient was placed on noninvasive mechanical ventilation, follow-up PCO2 greater than 100, after failing noninvasive mechanical ventilation he was intubated and admitted to the intensive care unit   Patient placed on aggressive diuresis for volume overload.  Liberated from mechanical ventilation on 09.19.22.    Transfer to [facility] on 09.21.     Patient continue to decline Bipap at night. Continue to have right hip pain. Bipap changed to Cpap to improve compliance, but also declines.  Negative fluid balance was achieved with improvement in oxygen requirements.    No SNF available, patient using 4 L/min per [facility] with oxygen saturation 92% or greater.  Plan for home health and follow up as outpatient.    Transition of care has been consulted, his wife is concerned about patient returning home.   Patient will follow with [facility] bariatric program, I called the office and clinical navigator will call him once gets out of the hospital.   Acute hypoxemic and hypercapnic respiratory failure due to decompensated diastolic heart failure.  Obesity hypoventilation, obesity class III. Patient initially admitted to intensive care unit, he received aggressive diuresis and successfully liberated from invasive mechanical ventilation.  Negative fluid balance was achieved, -26,842 mL, with significant improvement of his symptoms. Further work-up with echocardiography showed LV and RV systolic function preserved.  No significant valvular disease.  Patient will continue taking furosemide 40 mg daily and carvedilol twice daily.  2.  Hypertension.  Continue blood pressure control with amlodipine.  3.  Pre-type 2 diabetes mellitus.  Hemoglobin A1c  5.9. Patient received metformin during his hospitalization but he did experience abdominal discomfort. Patient will continue dulaglutide.   4.  Right posterior thigh pressure ulcer stage II.  Present on admission, continue local wound care.  5.  Acute kidney injury, metabolic alkalosis.  Patient kidney function was closely monitored during his hospitalization. His kidney function improved with diuresis.  At discharge sodium 135, potassium 4.3, chloride 88, bicarb 38, glucose 112, BUN 33, creatinine 0.89. Patient will continue taking furosemide to keep negative fluid balance, added potassium supplementation. Follow-up kidney function as an outpatient.  6.  Obesity class III. Obstructive sleep apnea, obesity hypoventilation syndrome  Patient has been bedbound due to severe obesity and hip osteoarthritis. Patient has been seen by [facility].  He will be called after patient is discharged from the hospital for follow-up.  I have explained Mr. Fitton that follow-up with bariatric program is of extreme importance in order to control his weight, improve his quality of life and decrease his comorbidities. Losing weight will be a life-saving intervention.  Encourage to use Cpap at home.   Discharge Diagnoses:  Principal Problem:   Acute diastolic CHF (congestive heart failure) (HCC) Active Problems:   HTN (hypertension)   Severe obstructive sleep apnea   Bedbound   Hypoxia   Respiratory failure (HCC)   Pressure injury of skin   Pre-diabetes   Obesity, Class III, BMI 40-49.9 (morbid obesity) ([place])   Osteoarthritis    Discharge Instructions   Allergies as of 06/15/2021       Reactions   Aspirin Other (See Comments)   Upset stomach   Lactose Intolerance (gi) Nausea And Vomiting   Lisinopril    Causes cough        Medication List     STOP taking these medications    diclofenac 75 MG EC tablet Commonly known as: VOLTAREN   lidocaine 5 % Commonly  known as: LIDODERM       TAKE these medications    allopurinol 300 MG tablet Commonly known as: ZYLOPRIM TAKE 1 TABLET TWICE DAILY   amLODipine 10 MG tablet Commonly known as: NORVASC Take 1 tablet (10 mg total) by mouth daily.   carvedilol 3.125 MG tablet Commonly known as: COREG Take 1 tablet (3.125 mg total) by mouth 2 (two) times daily with a meal.   colchicine 0.6 MG tablet Commonly known as: Colcrys Take 1 tablet (0.6 mg total) by mouth daily as needed.   Ensure Max Protein Liqd Take 330 mLs (11 oz total) by mouth 2 (two) times daily.   furosemide 40 MG tablet Commonly known as: LASIX Take 1 tablet (40 mg total) by mouth daily. Start taking on: June 16, 2021   naloxone 4 MG/0.1ML Liqd nasal spray kit Commonly known as: NARCAN Spray nostril if needed for opioid overuse- decreased consciousness, decreased respirations   oxyCODONE-acetaminophen 10-325 MG tablet Commonly known as: PERCOCET Take 1 tablet by mouth every 6 (six) hours as needed for pain.   polyethylene glycol 17 g packet Commonly known as: MIRALAX / GLYCOLAX Take 17 g by mouth daily as needed for moderate constipation.   potassium chloride 10 MEQ tablet Commonly known as: KLOR-CON Take 1 tablet (10 mEq total) by mouth daily. Start taking on: June 16, 2021   tiZANidine 4 MG tablet Commonly known as: ZANAFLEX Take 1 tablet (4 mg total) by mouth every 8 (eight) hours as needed for muscle spasms.   Trulicity  0.75 MG/0.5ML Sopn Generic drug: Dulaglutide Inject 0.75 mg into the skin once a week.               Durable Medical Equipment  (From admission, onward)           Start     Ordered   06/14/21 1432  For home use only DME oxygen  Once       Question Answer Comment  Length of Need 6 Months   Mode or (Route) Nasal cannula   Liters per Minute 4   Frequency Continuous (stationary and portable oxygen unit needed)   Oxygen conserving device Yes   Oxygen delivery system  Gas      06/14/21 1431            Follow-up Information     [name], MD. Schedule an appointment as soon as possible for a visit .   Specialty: Family Medicine Contact information: [place] [address] [place], [place] Follow up.   Specialty: Home Health Services Why: Home Health RN, Physical Therapy, Aide , Social Work will call to arrange visits Contact information: [place] [address] [phone]         [facility] Follow up.   Why: home oxygen Contact information: [address] [place] [address] [phone]                Allergies  Allergen Reactions   Aspirin Other (See Comments)    Upset stomach   Lactose Intolerance (Gi) Nausea And Vomiting   Lisinopril     Causes cough       Procedures/Studies: DG Chest 1 View  Result Date: 06/10/2021 CLINICAL DATA:  Dyspnea R06.00 (ICD-10-CM) EXAM: CHEST  1 VIEW COMPARISON:  06/07/2021. FINDINGS: Evaluation is limited due to patient body habitus and patient rotation. Right IJ central venous catheter with tip likely projecting in the region of the SVC when accounting for patient rotation. Endotracheal tube is no longer visualized. Similar cardiomegaly. Pulmonary vascular congestion, possibly mildly improved. Probable small left pleural effusion. Possible pulmonary edema and pulmonary vascular congestion, which may be mildly improved. No visible pneumothorax on this semi erect radiograph. Left basilar opacities. IMPRESSION: 1. Limited study due to patient body habitus and patient rotation. 2. Similar cardiomegaly, probable small left pleural effusion, and overlying left basilar opacities. 3. Possible superimposed edema and pulmonary vascular congestion, which may be mildly improved. Electronically Signed   By: Margaretha Sheffield M.D.   On: 06/10/2021 11:57   CT CHEST WO CONTRAST  Result Date:  05/31/2021 CLINICAL DATA:  Cardiomegaly, follow-up.  Hypoxia. EXAM: CT CHEST WITHOUT CONTRAST TECHNIQUE: Multidetector CT imaging of the chest was performed following the standard protocol without IV contrast. COMPARISON:  Chest x-ray 05/31/2021. FINDINGS: Cardiovascular: The heart is moderately enlarged. There is no pericardial effusion. Aortic diameter is difficult to measure secondary to lack of contrast and motion artifact, but grossly within normal limits. Mediastinum/Nodes: No definite mediastinal lymphadenopathy. There is questionable right hilar lymphadenopathy with lymph nodes measuring up to 2.3 cm, although evaluation is limited secondary to motion artifact and lack contrast. Esophagus appears nondilated. Thyroid gland not well evaluated. Lungs/Pleura: There are multifocal patchy airspace opacities in the right upper lobe in bilateral lower lobes. Trachea and central airways are grossly patent, but not well evaluated. There is no pleural effusion or pneumothorax identified. Upper Abdomen: No acute abnormality.  Musculoskeletal: There is irregularity of the left scapular wing. This may be secondary to artifact, but scapular wing fracture cannot be entirely excluded. Degenerative changes affect the thoracic spine. IMPRESSION: 1. Moderate cardiomegaly. 2. Multifocal airspace disease in the bilateral lower lobes and right upper lobe worrisome for infection. 3. Questionable right hilar lymphadenopathy.  This is indeterminate. 4. Irregularity of the left scapular wing is most likely artifactual. Please correlate clinically to exclude fracture. Electronically Signed   By: Ronney Asters M.D.   On: 05/31/2021 23:17   DG CHEST PORT 1 VIEW  Result Date: 06/07/2021 CLINICAL DATA:  Intubation EXAM: PORTABLE CHEST 1 VIEW COMPARISON:  Earlier today FINDINGS: Advanced endotracheal tube with tip between the clavicular heads and carina. The enteric tube at least reaches the diaphragm. Cardiomegaly and pulmonary  edema/vascular congestion. IMPRESSION: Advanced endotracheal tube with tip between the clavicular heads and carina. Electronically Signed   By: Jorje Guild M.D.   On: 06/07/2021 06:50   DG CHEST PORT 1 VIEW  Result Date: 06/07/2021 CLINICAL DATA:  Intubation EXAM: PORTABLE CHEST 1 VIEW COMPARISON:  Three days ago FINDINGS: The endotracheal tube tip projects over the lower neck at the level of C7. An enteric tube at least reaches the diaphragm, beyond which there is obscuration. Right neck catheter with no confident visualization of the tip due to patient's size. Cardiomegaly and pulmonary edema. No pneumothorax. These results will be called to the ordering clinician or representative by the Radiologist Assistant, and communication documented in the PACS or [employer]. IMPRESSION: 1. Higher endotracheal tube with tip at the level of C7. Would need 4 to 5 cm of advancement to reach the upper chest. 2. Stable to progressive CHF pattern. Electronically Signed   By: Jorje Guild M.D.   On: 06/07/2021 04:59   DG CHEST PORT 1 VIEW  Result Date: 06/04/2021 CLINICAL DATA:  Endotracheal tube.  Pulmonary edema. EXAM: PORTABLE CHEST 1 VIEW COMPARISON:  06/02/2021 FINDINGS: Limited exam secondary to patient body habitus. Endotracheal tube is felt to terminate approximally 3.6 cm above carina. Nasogastric tube extends beyond the inferior aspect of the film. Right internal jugular line tip is difficult to visualize centrally, likely in the low SVC. Cardiomegaly accentuated by AP portable technique. Probable small left pleural effusion, similar. No pneumothorax. Moderate interstitial edema is not significantly changed. Diffuse airspace disease, most confluent in the left lower lobe, unchanged. IMPRESSION: Moderately degraded exam, as detailed above. Grossly similar appearance of the chest, with congestive heart failure, left pleural effusion, and airspace disease which could represent concurrent infection.  Electronically Signed   By: Abigail Miyamoto M.D.   On: 06/04/2021 08:47   DG Chest Port 1 View  Result Date: 06/02/2021 CLINICAL DATA:  ET tube, respiratory failure EXAM: PORTABLE CHEST 1 VIEW COMPARISON:  06/01/2021 FINDINGS: Cardiomegaly. Diffuse bilateral airspace disease, likely edema. Endotracheal tube and NG tube are unchanged. Right central line unchanged. IMPRESSION: Diffuse bilateral airspace disease, likely edema. Support devices stable. Electronically Signed   By: Rolm Baptise M.D.   On: 06/02/2021 08:30   DG CHEST PORT 1 VIEW  Result Date: 06/01/2021 CLINICAL DATA:  Central line placement. EXAM: PORTABLE CHEST 1 VIEW COMPARISON:  Earlier film, same date. FINDINGS: The endotracheal tube is in good position at the mid tracheal level. There is a new NG tube coursing down the esophagus and into the stomach. The right IJ catheter tip projects over region of the upper SVC. No pneumothorax. IMPRESSION: New NG tube in good position. Right IJ  central line tip overlies the region of the upper SVC. Electronically Signed   By: Marijo Sanes M.D.   On: 06/01/2021 16:15   DG CHEST PORT 1 VIEW  Result Date: 06/01/2021 CLINICAL DATA:  Central line placement and intubation EXAM: PORTABLE CHEST 1 VIEW COMPARISON:  Earlier same day FINDINGS: Patient is rotated. Endotracheal tube is present and has been retracted. Right IJ central line tip probably overlies SVC. No pneumothorax. Low lung volumes. Bilateral opacities again identified with increased total opacification of left thorax. Cardiomediastinal contours are obscured. IMPRESSION: Endotracheal tube has been retracted. Right IJ central line tip probably overlies SVC. No pneumothorax. Bilateral opacities with increased total opacification of left thorax. Electronically Signed   By: Macy Mis M.D.   On: 06/01/2021 13:53   DG CHEST PORT 1 VIEW  Result Date: 06/01/2021 CLINICAL DATA:  Status post intubation. EXAM: PORTABLE CHEST 1 VIEW COMPARISON:   05/31/2021 FINDINGS: Low volume rotated film with underpenetration. The cardio pericardial silhouette is enlarged. Probable diffuse bilateral airspace disease. Endotracheal tube tip appears to be in the right mainstem bronchus although airway anatomy is poorly demonstrated on this film. Telemetry leads overlie the chest. IMPRESSION: 1. Probable right mainstem intubation. Endotracheal tube could be withdrawn 3-4 cm for tip placement above the carina. 2. I called this report to the patient's nurse, Tiffany, who was in the room with the patient and directly verbalized these results to the attending physician, Dr. Roosevelt Locks, who was in the room with her. Electronically Signed   By: Misty Stanley M.D.   On: 06/01/2021 07:32   DG Chest Port 1 View  Result Date: 05/31/2021 CLINICAL DATA:  Shortness of breath. EXAM: PORTABLE CHEST 1 VIEW.  Patient is rotated. COMPARISON:  Chest x-ray 07/24/2015 FINDINGS: Marked patient rotation with partial opacification of the right lung which may be due to rotation. Enlarged cardiac silhouette. Question main pulmonary artery enlargement which may be due to rotation. Poorly visualized right heart border which may be due to rotation. Question left pleural effusion. IMPRESSION: 1. Marked patient rotation with limited evaluation. Unable to evaluate the right lung with possible positive findings. Recommend repeat PA and lateral view of the chest. 2. Cardiomegaly with question of left pleural effusion. These results will be called to the ordering clinician or representative by the Radiologist Assistant, and communication documented in the PACS or [employer]. Electronically Signed   By: Iven Finn M.D.   On: 05/31/2021 16:36   ECHOCARDIOGRAM COMPLETE  Result Date: 06/01/2021    ECHOCARDIOGRAM REPORT   Patient Name:   EAGAN SHIFFLETT Date of Exam: 06/01/2021 Medical Rec #:  [id number]      Height:       71.0 in Accession #:    [id number]     Weight:       490.0 lb Date of Birth:   1970/10/09       BSA:          3.086 m Patient Age:    89 years       BP:           132/89 mmHg Patient Gender: M              HR:           68 bpm. Exam Location:  Inpatient Procedure: 2D Echo, Color Doppler, Cardiac Doppler and Intracardiac            Opacification Agent Indications:    Z66.29 Acute diastolic (congestive) heart failure  History:        Patient has prior history of Echocardiogram examinations, most                 recent 03/12/2018. CHF; Risk Factors:Hypertension, Diabetes,                 Dyslipidemia and Sleep Apnea. Prior performed at [facility]:    [facility] Referring Phys: [id number] Lequita Halt  Sonographer Comments: Technically difficult due to patient body habitus; scanned supine on artificial respirator. IMPRESSIONS  1. Left ventricular ejection fraction, by estimation, is 60 to 65%. The left ventricle has normal function. The left ventricle has no regional wall motion abnormalities. There is moderate left ventricular hypertrophy. Left ventricular diastolic parameters were normal.  2. Right ventricular systolic function is normal. The right ventricular size is normal.  3. The mitral valve is normal in structure. No evidence of mitral valve regurgitation. No evidence of mitral stenosis.  4. The aortic valve is tricuspid. Aortic valve regurgitation is not visualized. No aortic stenosis is present.  5. Aortic dilatation noted. There is moderate dilatation of the aortic root, measuring 44 mm. There is moderate dilatation of the ascending aorta, measuring 43 mm. FINDINGS  Left Ventricle: Left ventricular ejection fraction, by estimation, is 60 to 65%. The left ventricle has normal function. The left ventricle has no regional wall motion abnormalities. Definity contrast agent was given IV to delineate the left ventricular  endocardial borders. The left ventricular internal cavity size was normal in size. There is moderate left ventricular hypertrophy. Left ventricular diastolic  parameters were normal. Normal left ventricular filling pressure. Right Ventricle: The right ventricular size is normal. No increase in right ventricular wall thickness. Right ventricular systolic function is normal. Left Atrium: Left atrial size was normal in size. Right Atrium: Right atrial size was normal in size. Pericardium: There is no evidence of pericardial effusion. Mitral Valve: The mitral valve is normal in structure. No evidence of mitral valve regurgitation. No evidence of mitral valve stenosis. Tricuspid Valve: The tricuspid valve is normal in structure. Tricuspid valve regurgitation is trivial. No evidence of tricuspid stenosis. Aortic Valve: The aortic valve is tricuspid. Aortic valve regurgitation is not visualized. No aortic stenosis is present. Pulmonic Valve: The pulmonic valve was normal in structure. Pulmonic valve regurgitation is trivial. No evidence of pulmonic stenosis. Aorta: Aortic dilatation noted. There is moderate dilatation of the aortic root, measuring 44 mm. There is moderate dilatation of the ascending aorta, measuring 43 mm. IAS/Shunts: No atrial level shunt detected by color flow Doppler.  LEFT VENTRICLE PLAX 2D LVIDd:         6.00 cm  Diastology LVIDs:         3.80 cm  LV e' medial:    11.10 cm/s LV PW:         1.20 cm  LV E/e' medial:  7.3 LV IVS:        1.40 cm  LV e' lateral:   10.40 cm/s LVOT diam:     2.60 cm  LV E/e' lateral: 7.8 LV SV:         101 LV SV Index:   33 LVOT Area:     5.31 cm  RIGHT VENTRICLE RV S prime:     17.80 cm/s TAPSE (M-mode): 3.6 cm LEFT ATRIUM             Index       RIGHT ATRIUM  Index LA diam:        3.70 cm 1.20 cm/m  RA Area:     26.00 cm LA Vol (A2C):   81.2 ml 26.31 ml/m RA Volume:   82.70 ml  26.80 ml/m LA Vol (A4C):   88.5 ml 28.68 ml/m LA Biplane Vol: 85.8 ml 27.80 ml/m  AORTIC VALVE LVOT Vmax:   101.00 cm/s LVOT Vmean:  79.500 cm/s LVOT VTI:    0.190 m  AORTA Ao Root diam: 4.40 cm Ao Asc diam:  4.30 cm MITRAL VALVE                TRICUSPID VALVE MV Area (PHT): 2.83 cm    TR Peak grad:   14.2 mmHg MV Decel Time: 268 msec    TR Vmax:        188.67 cm/s MV E velocity: 81.00 cm/s MV A velocity: 65.60 cm/s  SHUNTS MV E/A ratio:  1.23        Systemic VTI:  0.19 m                            Systemic Diam: 2.60 cm Skeet Latch MD Electronically signed by Skeet Latch MD Signature Date/Time: 06/01/2021/4:56:43 PM    Final    VAS [place] LOWER EXTREMITY VENOUS (DVT)  Result Date: 06/01/2021  Lower Venous DVT Study Patient Name:  ELLSWORTH WALDSCHMIDT  Date of Exam:   06/01/2021 Medical Rec #: [id number]       Accession #:    [id number] Date of Birth: 06-15-1971        Patient Gender: M Patient Age:   73 years Exam Location[facility]  [facility] Procedure:      VAS [place] LOWER EXTREMITY VENOUS (DVT) Referring Phys: Wynetta Fines --------------------------------------------------------------------------------  Indications: Edema.  Limitations: Body habitus, poor ultrasound/tissue interface and depth of vessels. Patient unable to tolerate all compression maneuvers. Comparison Study: No prior study Performing Technologist: Maudry Mayhew MHA, RDMS, RVT, RDCS  Examination Guidelines: A complete evaluation includes B-mode imaging, spectral Doppler, color Doppler, and power Doppler as needed of all accessible portions of each vessel. Bilateral testing is considered an integral part of a complete examination. Limited examinations for reoccurring indications may be performed as noted. The reflux portion of the exam is performed with the patient in reverse Trendelenburg.  +---------+---------------+---------+-----------+----------+--------------+ RIGHT    CompressibilityPhasicitySpontaneityPropertiesThrombus Aging +---------+---------------+---------+-----------+----------+--------------+ CFV      Full           Yes      Yes        patent                   +---------+---------------+---------+-----------+----------+--------------+ SFJ      Full                                patent                   +---------+---------------+---------+-----------+----------+--------------+ FV Prox                          Yes        patent                   +---------+---------------+---------+-----------+----------+--------------+ FV Mid  Yes        patent                   +---------+---------------+---------+-----------+----------+--------------+ FV Distal                        Yes        patent                   +---------+---------------+---------+-----------+----------+--------------+ PFV                              Yes        patent                   +---------+---------------+---------+-----------+----------+--------------+ POP      Full           Yes      Yes        patent                   +---------+---------------+---------+-----------+----------+--------------+ PTV      Full                    Yes        patent                   +---------+---------------+---------+-----------+----------+--------------+ PERO     Full                    Yes        patent                   +---------+---------------+---------+-----------+----------+--------------+   +---------+---------------+---------+-----------+----------+--------------+ LEFT     CompressibilityPhasicitySpontaneityPropertiesThrombus Aging +---------+---------------+---------+-----------+----------+--------------+ CFV      Full                    Yes        patent                   +---------+---------------+---------+-----------+----------+--------------+ FV Prox  Full                    Yes        patent                   +---------+---------------+---------+-----------+----------+--------------+ FV Mid                           Yes        patent                   +---------+---------------+---------+-----------+----------+--------------+ FV Distal                        Yes        patent                    +---------+---------------+---------+-----------+----------+--------------+ POP      Full           Yes      Yes        patent                   +---------+---------------+---------+-----------+----------+--------------+ PTV  Yes        patent                   +---------+---------------+---------+-----------+----------+--------------+ PERO                             Yes        patent                   +---------+---------------+---------+-----------+----------+--------------+     Summary: RIGHT: - There is no evidence of deep vein thrombosis in the lower extremity. However, portions of this examination were limited- see technologist comments above.  - No cystic structure found in the popliteal fossa.  LEFT: - There is no evidence of deep vein thrombosis in the lower extremity. However, portions of this examination were limited- see technologist comments above.  - No cystic structure found in the popliteal fossa.  *See table(s) above for measurements and observations. Electronically signed by Harold Barban MD on 06/01/2021 at 7:11:55 PM.    Final       Subjective: Patient is feeling better, he has abdominal discomfort likely related to metformin. No nausea or vomiting. Dyspnea has improved   Discharge Exam: Vitals:   06/15/21 0329 06/15/21 0827  BP: 115/79 113/74  Pulse:  84  Resp: 16 20  Temp: 97.9 F (36.6 C) 97.6 F (36.4 C)  SpO2:  95%   Vitals:   06/14/21 1918 06/15/21 0046 06/15/21 0329 06/15/21 0827  BP: 120/76 117/76 115/79 113/74  Pulse: 82   84  Resp: 18 (!) '21 16 20  ' Temp: 98.3 F (36.8 C) 97.9 F (36.6 C) 97.9 F (36.6 C) 97.6 F (36.4 C)  TempSrc: Oral Oral Oral Oral  SpO2: 99% 99%  95%  Height:        General: Not in pain or dyspnea  Neurology: Awake and alert, non focal  E ENT: no pallor, no icterus, oral mucosa moist Cardiovascular: No JVD. S1-S2 present, rhythmic, no gallops, rubs, or murmurs. No lower  extremity edema. Pulmonary: positive breath sounds bilaterally, with no wheezing, rhonchi or rales. Gastrointestinal. Abdomen protuberant  Skin. No rashes Musculoskeletal: no joint deformities   The results of significant diagnostics from this hospitalization (including imaging, microbiology, ancillary and laboratory) are listed below for reference.     Microbiology: No results found for this or any previous visit (from the past 240 hour(s)).   Labs: BNP (last 3 results) Recent Labs    05/31/21 1544  BNP 428.7*   Basic Metabolic Panel: Recent Labs  Lab 06/09/21 0500 06/10/21 0450 06/11/21 0059 06/12/21 0048 06/13/21 0048 06/14/21 0021 06/15/21 0037  NA 139 137 137 136 137 136 135  K 4.7 4.0 3.8 3.2* 3.5 3.8 4.3  CL 92* 89* 90* 87* 88* 89* 88*  CO2 39* 40* 40* 41* 40* 41* 38*  GLUCOSE 138* 131* 125* 124* 122* 116* 112*  BUN 38* 41* 43* 48* 50* 39* 33*  CREATININE 1.40* 1.29* 1.21 1.26* 1.17 1.18 0.89  CALCIUM 9.1 9.0 8.9 9.3 9.1 8.9 9.1  MG 2.3 2.3 2.4  --   --   --   --    Liver Function Tests: No results for input(s): AST, ALT, ALKPHOS, BILITOT, PROT, ALBUMIN in the last 168 hours. No results for input(s): LIPASE, AMYLASE in the last 168 hours. No results for input(s): AMMONIA in the last 168 hours. CBC: Recent Labs  Lab 06/14/21 0021  WBC 6.3  HGB 12.1*  HCT 41.5  MCV 95.4  PLT 260   Cardiac Enzymes: No results for input(s): CKTOTAL, CKMB, CKMBINDEX, TROPONINI in the last 168 hours. BNP: Invalid input(s): POCBNP CBG: Recent Labs  Lab 06/10/21 0759 06/10/21 1134 06/11/21 1041 06/14/21 1132 06/14/21 1629  GLUCAP 188* 130* 146* 129* 106*   D-Dimer No results for input(s): DDIMER in the last 72 hours. Hgb A1c No results for input(s): HGBA1C in the last 72 hours. Lipid Profile No results for input(s): CHOL, HDL, LDLCALC, TRIG, CHOLHDL, LDLDIRECT in the last 72 hours. Thyroid function studies No results for input(s): TSH, T4TOTAL, T3FREE,  THYROIDAB in the last 72 hours.  Invalid input(s): FREET3 Anemia work up No results for input(s): VITAMINB12, FOLATE, FERRITIN, TIBC, IRON, RETICCTPCT in the last 72 hours. Urinalysis    Component Value Date/Time   COLORURINE STRAW (A) 05/31/2021 2320   APPEARANCEUR CLEAR 05/31/2021 2320   LABSPEC 1.006 05/31/2021 2320   PHURINE 5.0 05/31/2021 2320   GLUCOSEU NEGATIVE 05/31/2021 2320   HGBUR NEGATIVE 05/31/2021 2320   BILIRUBINUR NEGATIVE 05/31/2021 2320   BILIRUBINUR Neg 12/18/2019 1728   KETONESUR NEGATIVE 05/31/2021 2320   PROTEINUR NEGATIVE 05/31/2021 2320   UROBILINOGEN 0.2 12/18/2019 1728   UROBILINOGEN 0.2 05/04/2010 1412   NITRITE NEGATIVE 05/31/2021 2320   LEUKOCYTESUR NEGATIVE 05/31/2021 2320   Sepsis Labs Invalid input(s): PROCALCITONIN,  WBC,  LACTICIDVEN Microbiology No results found for this or any previous visit (from the past 240 hour(s)).   Time coordinating discharge: 45 minutes  SIGNED:   Tawni Millers, MD  [facility] 06/15/2021, 10:15 AM

## 2021-06-15 NOTE — Progress Notes (Signed)
HOSPITAL MEDICINE OVERNIGHT EVENT NOTE    Notified by nursing that patient was to be discharged early in the day however patient's daughter declined receiving the patient home due to fear that the oxygen will "blow a fuse."  Patient apparently is now going to be taken to an alternative place tomorrow by Lenard Lance, a friend of the family.  Discharge is being canceled for now.  Patient is now complaining of very loose, sometimes watery stools, occurring 6 times today.  Per my discussion with nursing patient is not complaining of any abdominal pain, has not been exhibiting any fever and has no leukocytosis.  Daytime notes make no mention of an infectious cause of diarrhea.  Order a one-time dose of loperamide, if diarrhea persists daytime provider to reevaluate.   Marinda Elk  MD [facility]

## 2021-06-15 NOTE — Plan of Care (Signed)

## 2021-06-15 NOTE — Plan of Care (Signed)
  Problem: Education: Goal: Knowledge of General Education information will improve Description: Including pain rating scale, medication(s)/side effects and non-pharmacologic comfort measures Outcome: Progressing   Problem: Health Behavior/Discharge Planning: Goal: Ability to manage health-related needs will improve Outcome: Progressing   Problem: Clinical Measurements: Goal: Diagnostic test results will improve Outcome: Progressing Goal: Respiratory complications will improve Outcome: Progressing Goal: Cardiovascular complication will be avoided Outcome: Progressing   Problem: Coping: Goal: Level of anxiety will decrease Outcome: Progressing   Problem: Elimination: Goal: Will not experience complications related to bowel motility Outcome: Progressing Goal: Will not experience complications related to urinary retention Outcome: Progressing

## 2021-06-16 DIAGNOSIS — I5031 Acute diastolic (congestive) heart failure: Secondary | ICD-10-CM | POA: Diagnosis not present

## 2021-06-16 MED ORDER — LOPERAMIDE HCL 2 MG PO CAPS
4.0000 mg | ORAL_CAPSULE | Freq: Once | ORAL | Status: AC
  Administered 2021-06-16: 4 mg via ORAL
  Filled 2021-06-16: qty 2

## 2021-06-16 NOTE — Plan of Care (Signed)
  Problem: Education: Goal: Knowledge of General Education information will improve Description: Including pain rating scale, medication(s)/side effects and non-pharmacologic comfort measures Outcome: Progressing   Problem: Health Behavior/Discharge Planning: Goal: Ability to manage health-related needs will improve Outcome: Progressing   Problem: Clinical Measurements: Goal: Ability to maintain clinical measurements within normal limits will improve Outcome: Progressing Goal: Diagnostic test results will improve Outcome: Progressing Goal: Respiratory complications will improve Outcome: Progressing Goal: Cardiovascular complication will be avoided Outcome: Progressing   Problem: Nutrition: Goal: Adequate nutrition will be maintained Outcome: Progressing   Problem: Elimination: Goal: Will not experience complications related to bowel motility Outcome: Progressing   Problem: Pain Managment: Goal: General experience of comfort will improve Outcome: Progressing

## 2021-06-16 NOTE — Progress Notes (Signed)
PROGRESS NOTE    Michael Payne  LEX:[id number] DOB: 1970/10/05 DOA: 05/31/2021 PCP: Joaquim Nam, MD    Brief Narrative:  50 year old with morbid obesity, obstructive sleep apnea not using CPAP at night, hypertension, ambulatory dysfunction admitted to the hospital on 9/12 with shortness of breath.  Presentation blood gas with pH 7.23, PCO2 98.  Initially treated with BiPAP, did not improve so intubated and admitted to ICU.  Extubated on 9/19 after extensive diuresis.  Clinically stabilizing.  Needs to wear CPAP or BiPAP at night but he does not do it.  On oxygen at about 4 L/min. Declines to go to a SNF. Could not have adequate arrangements at home so he could not leave yesterday.   Assessment & Plan:   Principal Problem:   Acute diastolic CHF (congestive heart failure) (HCC) Active Problems:   HTN (hypertension)   Severe obstructive sleep apnea   Bedbound   Hypoxia   Respiratory failure (HCC)   Pressure injury of skin   Pre-diabetes   Obesity, Class III, BMI 40-49.9 (morbid obesity) (HCC)   Osteoarthritis  Acute on chronic hypoxemic and hypercapnic respiratory failure due to decompensated heart failure,  Obesity hypoventilation syndrome Noncompliance to noninvasive ventilator Essential hypertension Acute kidney injury with metabolic alkalosis Super morbid obesity  Plan: Adequately medically stabilized.  High risk of readmission due to not wearing CPAP or BiPAP. Arrange adequate support at home to go home. Discharge when arrangements possible at home.  Apparently he has a Foley catheter, will address and remove today.    DVT prophylaxis: SCDs Start: 06/01/21 0802   Code Status: Full code Family Communication: Partial, okay for intubation Disposition Plan: Status is: Inpatient  Remains inpatient appropriate because:Unsafe d/c plan  Dispo: The patient is from: Home              Anticipated d/c is to: Home              Patient currently is medically stable  to d/c.   Difficult to place patient Yes         Consultants:  PCCM  Procedures:  None  Antimicrobials:  Completed antibiotic therapy   Subjective: Patient seen and examined.  No other overnight events.  Cannot wear CPAP. Had multiple episodes of loose watery stool overnight, received Colace last night. Just received 1 dose of Imodium. He is even not sure he has a Foley catheter.  Objective: Vitals:   06/15/21 2323 06/15/21 2329 06/16/21 0305 06/16/21 0725  BP: 97/65  116/66 117/73  Pulse: 82  91 93  Resp: 17  18   Temp: 98 F (36.7 C)  98.1 F (36.7 C) 98.5 F (36.9 C)  TempSrc: Oral  Oral   SpO2: 97% 98% 96% 100%  Height:        Intake/Output Summary (Last 24 hours) at 06/16/2021 4496 Last data filed at 06/16/2021 7591 Gross per 24 hour  Intake 480 ml  Output 1875 ml  Net -1395 ml   Filed Weights    Examination:  General: Morbidly obese gentleman, on 4 L oxygen.  Looks comfortable. Cardiovascular: S1-S2 normal.  Regular rhythm. Respiratory: Bilateral clear. Gastrointestinal: Soft.  Nontender.  Obese and pendulous.  No localized tenderness.    Data Reviewed: I have personally reviewed following labs and imaging studies  CBC: Recent Labs  Lab 06/14/21 0021  WBC 6.3  HGB 12.1*  HCT 41.5  MCV 95.4  PLT 260   Basic Metabolic Panel: Recent Labs  Lab 06/10/21 0450  06/11/21 0059 06/12/21 0048 06/13/21 0048 06/14/21 0021 06/15/21 0037  NA 137 137 136 137 136 135  K 4.0 3.8 3.2* 3.5 3.8 4.3  CL 89* 90* 87* 88* 89* 88*  CO2 40* 40* 41* 40* 41* 38*  GLUCOSE 131* 125* 124* 122* 116* 112*  BUN 41* 43* 48* 50* 39* 33*  CREATININE 1.29* 1.21 1.26* 1.17 1.18 0.89  CALCIUM 9.0 8.9 9.3 9.1 8.9 9.1  MG 2.3 2.4  --   --   --   --    GFR: Estimated Creatinine Clearance: 218.6 mL/min (by C-G formula based on SCr of 0.89 mg/dL). Liver Function Tests: No results for input(s): AST, ALT, ALKPHOS, BILITOT, PROT, ALBUMIN in the last 168 hours. No  results for input(s): LIPASE, AMYLASE in the last 168 hours. No results for input(s): AMMONIA in the last 168 hours. Coagulation Profile: No results for input(s): INR, PROTIME in the last 168 hours. Cardiac Enzymes: No results for input(s): CKTOTAL, CKMB, CKMBINDEX, TROPONINI in the last 168 hours. BNP (last 3 results) Recent Labs    03/09/21 1232  PROBNP 23.0   HbA1C: No results for input(s): HGBA1C in the last 72 hours. CBG: Recent Labs  Lab 06/10/21 0759 06/10/21 1134 06/11/21 1041 06/14/21 1132 06/14/21 1629  GLUCAP 188* 130* 146* 129* 106*   Lipid Profile: No results for input(s): CHOL, HDL, LDLCALC, TRIG, CHOLHDL, LDLDIRECT in the last 72 hours. Thyroid Function Tests: No results for input(s): TSH, T4TOTAL, FREET4, T3FREE, THYROIDAB in the last 72 hours. Anemia Panel: No results for input(s): VITAMINB12, FOLATE, FERRITIN, TIBC, IRON, RETICCTPCT in the last 72 hours. Sepsis Labs: No results for input(s): PROCALCITON, LATICACIDVEN in the last 168 hours.  No results found for this or any previous visit (from the past 240 hour(s)).       Radiology Studies: No results found.      Scheduled Meds:  acetaminophen  650 mg Oral QHS   allopurinol  300 mg Oral BID   amLODipine  10 mg Oral Daily   carvedilol  3.125 mg Oral BID WC   Chlorhexidine Gluconate Cloth  6 each Topical Daily   enoxaparin (LOVENOX) injection  100 mg Subcutaneous Q24H   furosemide  40 mg Oral Daily   loperamide  4 mg Oral Once   multivitamin with minerals  1 tablet Oral Daily   potassium chloride  10 mEq Oral Daily   Ensure Max Protein  11 oz Oral BID   sodium chloride flush  3 mL Intravenous Q12H   Continuous Infusions:   LOS: 16 days    Time spent: 20 minutes    Dorcas Carrow, MD [facility] Pager [phone]

## 2021-06-16 NOTE — Progress Notes (Signed)
Report called to 5N RN, Angelito. Pt transported to 5N by NT and SWOT

## 2021-06-17 DIAGNOSIS — I5031 Acute diastolic (congestive) heart failure: Secondary | ICD-10-CM | POA: Diagnosis not present

## 2021-06-17 MED ORDER — PROSOURCE PLUS PO LIQD
30.0000 mL | Freq: Three times a day (TID) | ORAL | Status: DC
  Administered 2021-06-17 – 2021-06-27 (×27): 30 mL via ORAL
  Filled 2021-06-17 (×29): qty 30

## 2021-06-17 NOTE — Progress Notes (Signed)
Nutrition Follow-up  DOCUMENTATION CODES:   Morbid obesity  INTERVENTION:   -Continue MVI with minerals daily -D/c Ensure Max po BID, each supplement provides 150 kcal and 30 grams of protein.   -30 ml Prosource plus TID, each supplement provides 100 kcals and 15 grams protein -Double protein portions with meals   NUTRITION DIAGNOSIS:   Increased nutrient needs related to acute illness as evidenced by estimated needs.  Ongoing  GOAL:   Patient will meet greater than or equal to 90% of their needs  Progressing   MONITOR:   PO intake, Supplement acceptance, Skin, I & O's  REASON FOR ASSESSMENT:   Consult, Ventilator Enteral/tube feeding initiation and management  ASSESSMENT:   Patient with PMH significant for GERD, HTN, DM, chronic R hip pain, and OSA requiring CPAP. Presents this admission with respiratory failure secondary to CHF exacerbation and noncompliance with CPAP at home.  Reviewed I/O's: -305 ml x 24 hours and -22.8 L since 06/03/21  UOP: 725 ml x 24 hours  Pt resting quietly at time of visit. RD did not disturb. Noted pt consumed about 75% of his breakfast tray. Documented meal completions 20-100%.   Pt refusing Ensure Max supplements, reporting they give him frequent bowel movements. RD will d/c.   Per TOC notes, pt is medically stable for discharge, however, awaiting home care arrangements.   Medications reviewed and include lasix.   Labs reviewed: CBGS: 106-129 (inpatient orders for glycemic control are none).    Diet Order:   Diet Order             Diet Heart Room service appropriate? Yes; Fluid consistency: Thin  Diet effective now                   EDUCATION NEEDS:   No education needs have been identified at this time  Skin:  Skin Assessment: Skin Integrity Issues: Skin Integrity Issues:: Stage II Stage II: R thigh  Last BM:  06/16/21  Height:   Ht Readings from Last 1 Encounters:  06/02/21 5\' 11"  (1.803 m)    Weight:    Wt Readings from Last 1 Encounters:  11/05/20 (!) 222.3 kg   BMI:  Body mass index is 83.54 kg/m.  Estimated Nutritional Needs:   Kcal:  2500-2700 kcal  Protein:  125-145 grams  Fluid:  >/= 2 L/day    11/07/20, RD, LDN, CDCES Registered Dietitian II Certified Diabetes Care and Education Specialist Please refer to [facility] for RD and/or RD on-call/weekend/after hours pager

## 2021-06-17 NOTE — TOC Progression Note (Signed)
Transition of Care [facility]) - Initial/Assessment Note    Patient Details  Name: Michael Payne MRN: [id number] Date of Birth: [birth year]  Transition of [facility]) CM/SW Contact:    Milinda Antis, [place] Phone Number: 06/17/2021, 4:54 PM  Clinical Narrative:                 11:39-  CSW called Michael Payne, the family friend that the patient reported was going to move to [place] and care for him.   Michael Payne has been unable to find a home in [place].  Michael Payne is currently living in [place], [place] with her 71 year old daughter and it would take a while for her to secure a place and move her things.  Michael Payne mentioned finding a one level home in [place] and the patient moving there. CSW would need to speak with the patient because there will be a cost for the patient to travel via ambulance to [place].  1300-  CSW met with the patient.  The patient stated that his wife is willing to let him come home, but has concerns about the oxygen.  CSW informed the patient of the information given from [place] and that if he went to [place] the cost would be $3,580.84 to be transported via ambulance.  The patient stated that he would like to go home.  CSW informed the patient that CSW would speak with the patient's spouse, but due to the patient being medically ready, if the wife is unable to assist the he would need to go to a [place].  13:30-  CSW spoke with the patient's spouse, Michael Payne.  The patient's wife is adamant that the patient cannot return to the home due to to the electrical system in the home not being able to manage the patient's oxygen and not being home (working) to assist the patient as he would need.   Numerous facilities have declined the patient for admission prior to this CSW's involvement.  CSW called the following additional aagencies on this day who have also declined the patient.  See below.  [place], [place], [place],  [place], [place], [place], [place], [facility], [place], [place], [place], [place], [place], [facility], [place], and [place].   TOC Barrier to d/c:  bed offers   Patient Goals and CMS Choice Patient states their goals for this hospitalization and ongoing recovery are:: return home with [facility] CMS Medicare.gov Compare Post Acute Care list provided to:: Patient Choice offered to / list presented to : Patient  Expected Discharge Plan and Services Expected Discharge Plan: [place] In-house Referral: Clinical Social Work Discharge Planning Services: CM Consult Post Acute Care Choice: [place] arrangements for the past 2 months: Single Family Home Expected Discharge Date: 06/15/21               DME Arranged: Oxygen DME Agency: [employer] Date DME Agency Contacted: 06/14/21 Time DME Agency Contacted: [id number] Representative spoke with at DME Agency: Thedore Mins HH Arranged: RN, Disease Management, PT, Nurse's Aide, Social Work [employer] Agency: [place] Date [place]: 06/14/21 Time [place]: 4 Representative spoke with at [place]: Freddi Starr  Prior Living Arrangements/Services Living arrangements for the past 2 months: [place] with:: Adult Children, Spouse Patient language and need for interpreter reviewed:: Yes Do you feel safe going back to the place where you live?:  Yes      Need for Family Participation in Patient Care: Yes (Comment) Care giver support system in place?: Yes (comment) Current home services: Home PT, Home RN, Homehealth aide, DME (hospital bed, trapeze bar, hoyer lift, w/chair walker) Criminal Activity/Legal Involvement Pertinent to Current Situation/Hospitalization: No - Comment as needed  Activities of Daily Living Home Assistive Devices/Equipment: Trapeze ADL Screening (condition at time of  admission) Patient's cognitive ability adequate to safely complete daily activities?: No Is the patient deaf or have difficulty hearing?: No Does the patient have difficulty seeing, even when wearing glasses/contacts?: No Does the patient have difficulty concentrating, remembering, or making decisions?: No Patient able to express need for assistance with ADLs?: Yes Does the patient have difficulty dressing or bathing?: Yes Independently performs ADLs?: No Communication: Independent Dressing (OT): Dependent Grooming: Dependent Feeding: Needs assistance Is this a change from baseline?: Pre-admission baseline Bathing: Dependent Toileting: Dependent In/Out Bed: Dependent Walks in Home: Dependent Does the patient have difficulty walking or climbing stairs?: Yes Weakness of Legs: Right Weakness of Arms/Hands: None  Permission Sought/Granted Permission sought to share information with : Family Supports, Chartered certified accountant granted to share information with : Yes, Verbal Permission Granted  Share Information with NAME: Michael Payne wife, Michael Payne daughter  Permission granted to share info w AGENCY: SNF  Permission granted to share info w Relationship: wife, daughter  Permission granted to share info w Contact Information: [phone]  Emotional Assessment Appearance:: Appears stated age Attitude/Demeanor/Rapport: Engaged Affect (typically observed): Appropriate Orientation: : Oriented to Self, Oriented to Place, Oriented to  Time, Oriented to Situation Alcohol / Substance Use: Not Applicable Psych Involvement: No (comment)  Admission diagnosis:  Respiratory failure ([place]) [J96.90] CHF (congestive heart failure) ([place]) [I50.9] SOB (shortness of breath) [R06.02] Respiration abnormal [R06.9] Hypoxia [R09.02] Patient Active Problem List   Diagnosis Date Noted   Pre-diabetes 06/10/2021   Obesity, Class III, BMI 40-49.9 (morbid obesity) ([place]) 06/10/2021    Osteoarthritis 06/10/2021   Pressure injury of skin 06/09/2021   Respiratory failure ([place]) 63/78/5885   Acute diastolic CHF (congestive heart failure) ([place]) 05/31/2021   CHF (congestive heart failure) ([place]) 05/31/2021   Hypoxia 05/31/2021   Gout, unspecified 03/10/2021   Edema 03/10/2021   Diabetes mellitus without complication ([place]) 02/77/4128   Muscle spasms of both lower extremities 07/29/2020   Bedbound 05/22/2020   Unilateral osteoarthritis of hip, right 12/27/2019   Severe obstructive sleep apnea 02/22/2018   Hypertriglyceridemia 08/24/2017   Low HDL (under 40) 78/67/6720   Metabolic syndrome 94/70/9628   Morbid obesity ([place]) 10/14/2013   HTN (hypertension) 10/14/2013   PCP:  [name], MD Pharmacy:   [id number]Phone: [phone]: [phone] [facility] [place] [address]Phone: [phone]: [phone]    Social Determinants of Health (SDOH) Interventions    Readmission Risk Interventions No flowsheet data found.

## 2021-06-17 NOTE — Progress Notes (Signed)
Pt stated that he wants to wear bipap tonight. RT placed pt on bipap through [facility].

## 2021-06-17 NOTE — Progress Notes (Signed)
PROGRESS NOTE    Michael Payne  NLG:[id number] DOB: [birth year] DOA: 05/31/2021 PCP: Joaquim Nam, MD    Brief Narrative:  50 year old with morbid obesity, obstructive sleep apnea not using CPAP at night, hypertension, ambulatory dysfunction admitted to the hospital on 9/12 with shortness of breath.  Presentation blood gas with pH 7.23, PCO2 98.  Initially treated with BiPAP, did not improve so intubated and admitted to ICU.  Extubated on 9/19 after extensive diuresis.  Clinically stabilizing.  Needs to wear CPAP or BiPAP at night but he does not do it.  On oxygen at about 4 L/min. Declines to go to a SNF. Could not have adequate arrangements at home so he could not leave yesterday.   Assessment & Plan:   Principal Problem:   Acute diastolic CHF (congestive heart failure) (HCC) Active Problems:   HTN (hypertension)   Severe obstructive sleep apnea   Bedbound   Hypoxia   Respiratory failure (HCC)   Pressure injury of skin   Pre-diabetes   Obesity, Class III, BMI 40-49.9 (morbid obesity) (HCC)   Osteoarthritis  Acute on chronic hypoxemic and hypercapnic respiratory failure due to decompensated heart failure,  Obesity hypoventilation syndrome and sleep apnea. Noncompliance to noninvasive ventilator Essential hypertension Acute kidney injury with metabolic alkalosis Super morbid obesity  Plan: Adequately medically stabilized.  High risk of readmission due to not wearing CPAP or BiPAP. Arrange adequate support at home to go home. Discharge when arrangements possible at home.  After detailed discussion today and counseling, he wants to try CPAP now on.     DVT prophylaxis: SCDs Start: 06/01/21 0802   Code Status: Full code Family Communication: Partial, okay for intubation Disposition Plan: Status is: Inpatient  Remains inpatient appropriate because:Unsafe d/c plan  Dispo: The patient is from: Home              Anticipated d/c is to: Home              Patient  currently is medically stable to d/c.   Difficult to place patient Yes         Consultants:  PCCM  Procedures:  None  Antimicrobials:  Completed antibiotic therapy   Subjective: Patient seen and examined.  No overnight events.  Blood pressure was soft, however asymptomatic.  Amlodipine discontinued. Patient complains of lactose intolerance and diarrhea with Ensure.  Discontinued.  Today, patient asked me multiple questions.  He is asking how he can get better and get in shape.  We discussed about compliance to CPAP and working hard to put it on and he is agreeable.  Objective: Vitals:   06/16/21 2000 06/17/21 0809 06/17/21 0941 06/17/21 1241  BP: 105/61 (!) 86/45 (!) 96/58 110/71  Pulse: 80 83 80 78  Resp: 19 18  17   Temp: 98.2 F (36.8 C) 98.3 F (36.8 C)  98.5 F (36.9 C)  TempSrc: Oral Oral  Oral  SpO2: 99% 93%  99%  Height:        Intake/Output Summary (Last 24 hours) at 06/17/2021 1437 Last data filed at 06/17/2021 0600 Gross per 24 hour  Intake 240 ml  Output 350 ml  Net -110 ml   Filed Weights    Examination:  General: Morbidly obese gentleman, on 4 L oxygen.  Looks comfortable. Appropriately anxious. Cardiovascular: S1-S2 normal.  Regular rhythm. Respiratory: Bilateral clear. Gastrointestinal: Soft.  Nontender.  Obese and pendulous.  No localized tenderness.    Data Reviewed: I have personally reviewed following labs and  imaging studies  CBC: Recent Labs  Lab 06/14/21 0021  WBC 6.3  HGB 12.1*  HCT 41.5  MCV 95.4  PLT 260   Basic Metabolic Panel: Recent Labs  Lab 06/11/21 0059 06/12/21 0048 06/13/21 0048 06/14/21 0021 06/15/21 0037  NA 137 136 137 136 135  K 3.8 3.2* 3.5 3.8 4.3  CL 90* 87* 88* 89* 88*  CO2 40* 41* 40* 41* 38*  GLUCOSE 125* 124* 122* 116* 112*  BUN 43* 48* 50* 39* 33*  CREATININE 1.21 1.26* 1.17 1.18 0.89  CALCIUM 8.9 9.3 9.1 8.9 9.1  MG 2.4  --   --   --   --    GFR: Estimated Creatinine Clearance: 218.6  mL/min (by C-G formula based on SCr of 0.89 mg/dL). Liver Function Tests: No results for input(s): AST, ALT, ALKPHOS, BILITOT, PROT, ALBUMIN in the last 168 hours. No results for input(s): LIPASE, AMYLASE in the last 168 hours. No results for input(s): AMMONIA in the last 168 hours. Coagulation Profile: No results for input(s): INR, PROTIME in the last 168 hours. Cardiac Enzymes: No results for input(s): CKTOTAL, CKMB, CKMBINDEX, TROPONINI in the last 168 hours. BNP (last 3 results) Recent Labs    03/09/21 1232  PROBNP 23.0   HbA1C: No results for input(s): HGBA1C in the last 72 hours. CBG: Recent Labs  Lab 06/11/21 1041 06/14/21 1132 06/14/21 1629  GLUCAP 146* 129* 106*   Lipid Profile: No results for input(s): CHOL, HDL, LDLCALC, TRIG, CHOLHDL, LDLDIRECT in the last 72 hours. Thyroid Function Tests: No results for input(s): TSH, T4TOTAL, FREET4, T3FREE, THYROIDAB in the last 72 hours. Anemia Panel: No results for input(s): VITAMINB12, FOLATE, FERRITIN, TIBC, IRON, RETICCTPCT in the last 72 hours. Sepsis Labs: No results for input(s): PROCALCITON, LATICACIDVEN in the last 168 hours.  No results found for this or any previous visit (from the past 240 hour(s)).       Radiology Studies: No results found.      Scheduled Meds:  (feeding supplement) PROSource Plus  30 mL Oral TID BM   acetaminophen  650 mg Oral QHS   allopurinol  300 mg Oral BID   carvedilol  3.125 mg Oral BID WC   enoxaparin (LOVENOX) injection  100 mg Subcutaneous Q24H   furosemide  40 mg Oral Daily   multivitamin with minerals  1 tablet Oral Daily   potassium chloride  10 mEq Oral Daily   sodium chloride flush  3 mL Intravenous Q12H   Continuous Infusions:   LOS: 17 days    Time spent: 20 minutes    Dorcas Carrow, MD [facility] Pager [phone]

## 2021-06-18 DIAGNOSIS — M1611 Unilateral primary osteoarthritis, right hip: Secondary | ICD-10-CM

## 2021-06-18 DIAGNOSIS — E119 Type 2 diabetes mellitus without complications: Secondary | ICD-10-CM

## 2021-06-18 DIAGNOSIS — I5031 Acute diastolic (congestive) heart failure: Secondary | ICD-10-CM | POA: Diagnosis not present

## 2021-06-18 NOTE — Progress Notes (Signed)
Physical Therapy Treatment Patient Details Name: Michael Payne MRN: [id number] DOB: Apr 11, 1971 Today's Date: 06/18/2021   History of Present Illness Pt is a 50 y.o. male admitted 05/31/21 with SOB, cough, orthopnea. Workup for acute on chronic hypoxemic and hypercarbic respiratory failure. ETT 9/13-9/19. PMH includes morbid obesity, DM2, OSA (non compliant CPAP), HTN, chronic narcotic use for LE pain, bedbound x 2 years.    PT Comments    Pt making some improvements in bed mobility/rolling today.  He feels a lot of his limitations here are due to bed set up not similar to home (at home has firm matttress, not air mattress, and rails/footboard are better locations to reach).  Pt motivated and tolerating well.  Session focused on practicing bed mobility with cues/assist and exercises to improve LE strength/ROM to assist with rolling and bed mobility.     Recommendations for follow up therapy are one component of a multi-disciplinary discharge planning process, led by the attending physician.  Recommendations may be updated based on patient status, additional functional criteria and insurance authorization.  Follow Up Recommendations  Supervision for mobility/OOB     Equipment Recommendations  None recommended by PT    Recommendations for Other Services       Precautions / Restrictions Precautions Precautions: Fall;Other (comment) Precaution Comments: Has been bedbound for ~2 years; chronic R hip pain     Mobility  Bed Mobility Overal bed mobility: Needs Assistance Bed Mobility: Rolling Rolling: +2 for physical assistance;Mod assist;Min assist         General bed mobility comments: Practiced rolling both sides x 2.  Mod A x2 to R and Min A x 2 to L .  Required assist for crossing legs and to reach rail.  Once in sidelying pt able to maintain on his own for several mins to right to change pads    Transfers                 General transfer comment: Did not attempt; pt  reports largely limited by bed set up compared to home  Ambulation/Gait                 Stairs             Wheelchair Mobility    Modified Rankin (Stroke Patients Only)       Balance                                            Cognition Arousal/Alertness: Awake/alert Behavior During Therapy: WFL for tasks assessed/performed Overall Cognitive Status: Within Functional Limits for tasks assessed                                 General Comments: Very pleasant and motivated      Exercises General Exercises - Lower Extremity Ankle Circles/Pumps: AROM;Both;10 reps;Supine Quad Sets: AROM;Both;10 reps;Supine Heel Slides: AAROM;Both;10 reps;Supine (cues for knees/toes upward to limit ER as able) Hip ABduction/ADduction: AAROM;10 reps;Both;Supine Other Exercises Other Exercises: Legs held in hooklying position and pt worked on hip IR/ER with AAROM within pain limits x 10 bil Other Exercises: Legs in hooklying position held by therapist, pt reached across with arm and pushed into bed with leg like initiating a roll x 5 bil    General Comments  Pertinent Vitals/Pain Pain Assessment: Faces Faces Pain Scale: Hurts even more Pain Location: R hip Pain Descriptors / Indicators: Grimacing;Guarding Pain Intervention(s): Limited activity within patient's tolerance;Monitored during session;Repositioned    Home Living                      Prior Function            PT Goals (current goals can now be found in the care plan section) Acute Rehab PT Goals Patient Stated Goal: return home    Frequency    Min 2X/week      PT Plan      Co-evaluation              AM-PAC PT "6 Clicks" Mobility   Outcome Measure  Help needed turning from your back to your side while in a flat bed without using bedrails?: Total Help needed moving from lying on your back to sitting on the side of a flat bed without using bedrails?:  Total Help needed moving to and from a bed to a chair (including a wheelchair)?: Total Help needed standing up from a chair using your arms (e.g., wheelchair or bedside chair)?: Total Help needed to walk in hospital room?: Total Help needed climbing 3-5 steps with a railing? : Total 6 Click Score: 6    End of Session Equipment Utilized During Treatment: Oxygen Activity Tolerance: Patient tolerated treatment well Patient left: in bed;with call bell/phone within reach Nurse Communication: Mobility status PT Visit Diagnosis: Other abnormalities of gait and mobility (R26.89)     Time: 4403-4742 PT Time Calculation (min) (ACUTE ONLY): 29 min  Charges:  $Therapeutic Exercise: 8-22 mins $Therapeutic Activity: 8-22 mins                     Anise Salvo, PT Acute Rehab Services Pager [phone] [facility] [phone]    Rayetta Humphrey 06/18/2021, 12:54 PM

## 2021-06-18 NOTE — TOC Progression Note (Addendum)
Transition of Care [facility]) - Initial/Assessment Note    Patient Details  Name: Michael Payne MRN: [id number] Date of Birth: [birth year]  Transition of Care [facility] CM/SW Contact:    Milinda Antis, [place] Phone Number: 06/18/2021, 11:51 AM  Clinical Narrative:                 CSW met with the patient at bedside and informed the patient of the outcome from the conversation with the patient's wife yesterday.  The patient stated that he is no longer trying to go to [place], [place] and would like to go to a facility to see if he can improve and go home.    CSW informed the patient of the barriers to finding a SNF, and that CSW will continue to explore options.  CSW directed by [facility] leadership to request an updated weight on the patient to send to SNF's.  Attending and RN notified.    13:25- CSW called [facility] in [place] [phone]) to inquire about their ability to accept the patient.  The patient mentioned that he went to this facility previously.  CSW left VM for [employer] in admissions.  14:20-  [place] is reviewing the patient.  TOC Barrier to d/c:  Bed offers   Patient Goals and CMS Choice Patient states their goals for this hospitalization and ongoing recovery are:: return home with [facility] CMS Medicare.gov Compare Post Acute Care list provided to:: Patient Choice offered to / list presented to : Patient  Expected Discharge Plan and Services Expected Discharge Plan: [place] In-house Referral: Clinical Social Work Discharge Planning Services: CM Consult Post Acute Care Choice: [place] arrangements for the past 2 months: [place] Expected Discharge Date: 06/15/21               DME Arranged: Oxygen DME Agency: [employer] Date DME Agency Contacted: 06/14/21 Time DME Agency Contacted: 1638 Representative spoke with at DME Agency: Thedore Mins HH Arranged: RN, Disease Management, PT, Nurse's Aide, Social Work [employer]  Agency: [place] Date [place]: 06/14/21 Time [place]: 42 Representative spoke with at [place]: Freddi Starr  Prior Living Arrangements/Services Living arrangements for the past 2 months: [place] with:: Adult Children, Spouse Patient language and need for interpreter reviewed:: Yes Do you feel safe going back to the place where you live?: Yes      Need for Family Participation in Patient Care: Yes (Comment) Care giver support system in place?: Yes (comment) Current home services: Home PT, Home RN, Homehealth aide, DME (hospital bed, trapeze bar, hoyer lift, w/chair walker) Criminal Activity/Legal Involvement Pertinent to Current Situation/Hospitalization: No - Comment as needed  Activities of Daily Living Home Assistive Devices/Equipment: Trapeze ADL Screening (condition at time of admission) Patient's cognitive ability adequate to safely complete daily activities?: No Is the patient deaf or have difficulty hearing?: No Does the patient have difficulty seeing, even when wearing glasses/contacts?: No Does the patient have difficulty concentrating, remembering, or making decisions?: No Patient able to express need for assistance with ADLs?: Yes Does the patient have difficulty dressing or bathing?: Yes Independently performs ADLs?: No Communication: Independent Dressing (OT): Dependent Grooming: Dependent Feeding: Needs assistance Is this a change from baseline?: Pre-admission baseline Bathing: Dependent Toileting: Dependent In/Out Bed: Dependent Walks in Home: Dependent Does the patient have difficulty walking or climbing stairs?: Yes Weakness of Legs: Right Weakness of Arms/Hands: None  Permission Sought/Granted Permission sought to share  information with : Family Supports, Chartered certified accountant granted to share information with : Yes, Verbal Permission Granted  Share Information with NAME:  Marsel Gail wife, Angeline Carstens daughter  Permission granted to share info w AGENCY: SNF  Permission granted to share info w Relationship: wife, daughter  Permission granted to share info w Contact Information: [phone]  Emotional Assessment Appearance:: Appears stated age Attitude/Demeanor/Rapport: Engaged Affect (typically observed): Appropriate Orientation: : Oriented to Self, Oriented to Place, Oriented to  Time, Oriented to Situation Alcohol / Substance Use: Not Applicable Psych Involvement: No (comment)  Admission diagnosis:  Respiratory failure ([place]) [J96.90] CHF (congestive heart failure) ([place]) [I50.9] SOB (shortness of breath) [R06.02] Respiration abnormal [R06.9] Hypoxia [R09.02] Patient Active Problem List   Diagnosis Date Noted   Pre-diabetes 06/10/2021   Obesity, Class III, BMI 40-49.9 (morbid obesity) ([place]) 06/10/2021   Osteoarthritis 06/10/2021   Pressure injury of skin 06/09/2021   Respiratory failure ([place]) 34/62/1947   Acute diastolic CHF (congestive heart failure) ([place]) 05/31/2021   CHF (congestive heart failure) ([place]) 05/31/2021   Hypoxia 05/31/2021   Gout, unspecified 03/10/2021   Edema 03/10/2021   Diabetes mellitus without complication ([place]) 12/52/7129   Muscle spasms of both lower extremities 07/29/2020   Bedbound 05/22/2020   Unilateral osteoarthritis of hip, right 12/27/2019   Severe obstructive sleep apnea 02/22/2018   Hypertriglyceridemia 08/24/2017   Low HDL (under 40) 29/05/300   Metabolic syndrome 49/96/9249   Morbid obesity ([place]) 10/14/2013   HTN (hypertension) 10/14/2013   PCP:  [name], MD Pharmacy:   [id number]Phone: [phone]: [phone] [facility] [place] [place]Phone: [phone]: [phone]    Social Determinants  of Health (SDOH) Interventions    Readmission Risk Interventions No flowsheet data found.

## 2021-06-18 NOTE — Progress Notes (Signed)
PROGRESS NOTE    Michael Payne  OZH:[id number] DOB: 08/17/1971 DOA: 05/31/2021 PCP: Joaquim Nam, MD    Brief Narrative:  50 year old with morbid obesity, obstructive sleep apnea not using CPAP at night, hypertension, ambulatory dysfunction admitted to the hospital on 9/12 with shortness of breath.  Presentation blood gas with pH 7.23, PCO2 98.  Initially treated with BiPAP, did not improve so intubated and admitted to ICU.  Extubated on 9/19 after extensive diuresis.  Clinically stabilizing.  Needs to wear CPAP or BiPAP at night but he does not do it.  On oxygen at about 4 L/min. Could not go home because he could not have enough support at him home. 9/30, after much discussion, he wanted to try BiPAP and he wore BiPAP for few hours last night.   Assessment & Plan:   Principal Problem:   Acute diastolic CHF (congestive heart failure) (HCC) Active Problems:   HTN (hypertension)   Severe obstructive sleep apnea   Bedbound   Hypoxia   Respiratory failure (HCC)   Pressure injury of skin   Pre-diabetes   Obesity, Class III, BMI 40-49.9 (morbid obesity) (HCC)   Osteoarthritis  Acute on chronic hypoxemic and hypercapnic respiratory failure due to decompensated heart failure,  Obesity hypoventilation syndrome and sleep apnea. Noncompliance to noninvasive ventilator Essential hypertension Acute kidney injury with metabolic alkalosis Super morbid obesity  Plan: medically stabilized.  High risk of readmission due to not wearing CPAP or BiPAP. Arrange adequate support at home to go home. Discharge when arrangements possible at home. Now he wants to try BiPAP.     DVT prophylaxis: SCDs Start: 06/01/21 0802   Code Status: Full code Family Communication: Partial, okay for intubation Disposition Plan: Status is: Inpatient  Remains inpatient appropriate because:Unsafe d/c plan  Dispo: The patient is from: Home              Anticipated d/c is to: Home              Patient  currently is medically stable to d/c.   Difficult to place patient Yes         Consultants:  PCCM  Procedures:  None  Antimicrobials:  Completed antibiotic therapy   Subjective: Patient seen and examined.  No overnight events.  He asked to wear BiPAP last night and put it on for few hours.  He could tolerate it but he woke up multiple times.  He would like to continue to try that.  Objective: Vitals:   06/17/21 0941 06/17/21 1241 06/17/21 2126 06/17/21 2346  BP: (!) 96/58 110/71 (!) 96/55   Pulse: 80 78 80 87  Resp:  17 19 18   Temp:  98.5 F (36.9 C) 98.3 F (36.8 C)   TempSrc:  Oral Oral   SpO2:  99% 97% 96%  Height:        Intake/Output Summary (Last 24 hours) at 06/18/2021 1131 Last data filed at 06/17/2021 2100 Gross per 24 hour  Intake --  Output 450 ml  Net -450 ml   Filed Weights    Examination:  General: Morbidly obese gentleman, on 4 L oxygen.  Looks comfortable. Appropriately anxious. Cardiovascular: S1-S2 normal.  Regular rhythm. Respiratory: Bilateral clear. Gastrointestinal: Soft.  Nontender.  Obese and pendulous.  No localized tenderness.    Data Reviewed: I have personally reviewed following labs and imaging studies  CBC: Recent Labs  Lab 06/14/21 0021  WBC 6.3  HGB 12.1*  HCT 41.5  MCV 95.4  PLT  260   Basic Metabolic Panel: Recent Labs  Lab 06/12/21 0048 06/13/21 0048 06/14/21 0021 06/15/21 0037  NA 136 137 136 135  K 3.2* 3.5 3.8 4.3  CL 87* 88* 89* 88*  CO2 41* 40* 41* 38*  GLUCOSE 124* 122* 116* 112*  BUN 48* 50* 39* 33*  CREATININE 1.26* 1.17 1.18 0.89  CALCIUM 9.3 9.1 8.9 9.1   GFR: Estimated Creatinine Clearance: 218.6 mL/min (by C-G formula based on SCr of 0.89 mg/dL). Liver Function Tests: No results for input(s): AST, ALT, ALKPHOS, BILITOT, PROT, ALBUMIN in the last 168 hours. No results for input(s): LIPASE, AMYLASE in the last 168 hours. No results for input(s): AMMONIA in the last 168  hours. Coagulation Profile: No results for input(s): INR, PROTIME in the last 168 hours. Cardiac Enzymes: No results for input(s): CKTOTAL, CKMB, CKMBINDEX, TROPONINI in the last 168 hours. BNP (last 3 results) Recent Labs    03/09/21 1232  PROBNP 23.0   HbA1C: No results for input(s): HGBA1C in the last 72 hours. CBG: Recent Labs  Lab 06/14/21 1132 06/14/21 1629  GLUCAP 129* 106*   Lipid Profile: No results for input(s): CHOL, HDL, LDLCALC, TRIG, CHOLHDL, LDLDIRECT in the last 72 hours. Thyroid Function Tests: No results for input(s): TSH, T4TOTAL, FREET4, T3FREE, THYROIDAB in the last 72 hours. Anemia Panel: No results for input(s): VITAMINB12, FOLATE, FERRITIN, TIBC, IRON, RETICCTPCT in the last 72 hours. Sepsis Labs: No results for input(s): PROCALCITON, LATICACIDVEN in the last 168 hours.  No results found for this or any previous visit (from the past 240 hour(s)).       Radiology Studies: No results found.      Scheduled Meds:  (feeding supplement) PROSource Plus  30 mL Oral TID BM   acetaminophen  650 mg Oral QHS   allopurinol  300 mg Oral BID   carvedilol  3.125 mg Oral BID WC   enoxaparin (LOVENOX) injection  100 mg Subcutaneous Q24H   furosemide  40 mg Oral Daily   multivitamin with minerals  1 tablet Oral Daily   potassium chloride  10 mEq Oral Daily   sodium chloride flush  3 mL Intravenous Q12H   Continuous Infusions:   LOS: 18 days    Time spent: 20 minutes    Dorcas Carrow, MD [facility] Pager [phone]

## 2021-06-19 DIAGNOSIS — I5031 Acute diastolic (congestive) heart failure: Secondary | ICD-10-CM | POA: Diagnosis not present

## 2021-06-19 NOTE — Progress Notes (Signed)
Pt places self on CPAP 

## 2021-06-19 NOTE — Plan of Care (Signed)

## 2021-06-19 NOTE — Progress Notes (Signed)
PROGRESS NOTE    Michael Payne  WIO:[id number] DOB: [birth year] DOA: 05/31/2021 PCP: Joaquim Nam, MD    Brief Narrative:  50 year old with morbid obesity, obstructive sleep apnea not using CPAP at night, hypertension, ambulatory dysfunction admitted to the hospital on 9/12 with shortness of breath.  Presentation blood gas with pH 7.23, PCO2 98.  Initially treated with BiPAP, did not improve so intubated and admitted to ICU.  Extubated on 9/19 after extensive diuresis.  Clinically stabilizing.  Needs to wear CPAP or BiPAP at night but he does not do it.  On oxygen at about 4 L/min. Could not go home because he could not have enough support at him home. 9/30, after much discussion, he started wearing BiPAP.   Assessment & Plan:   Principal Problem:   Acute diastolic CHF (congestive heart failure) (HCC) Active Problems:   HTN (hypertension)   Severe obstructive sleep apnea   Bedbound   Hypoxia   Respiratory failure (HCC)   Pressure injury of skin   Pre-diabetes   Obesity, Class III, BMI 40-49.9 (morbid obesity) (HCC)   Osteoarthritis  Acute on chronic hypoxemic and hypercapnic respiratory failure due to decompensated heart failure,  Obesity hypoventilation syndrome and sleep apnea. Noncompliance to noninvasive ventilator at home.  No started wearing BiPAP. Essential hypertension Acute kidney injury with metabolic alkalosis Super morbid obesity  Plan: medically stabilized.  High risk of readmission due to not wearing CPAP or BiPAP. Arrange adequate support at home to go home. Discharge when arrangements possible at home. Not he is trying BiPAP.  Medically stable.  Waiting to go to rehab versus SNF.     DVT prophylaxis: SCDs Start: 06/01/21 0802   Code Status: Full code Family Communication: Partial, okay for intubation Disposition Plan: Status is: Inpatient  Remains inpatient appropriate because:Unsafe d/c plan  Dispo: The patient is from: Home               Anticipated d/c is to: Home              Patient currently is medically stable to d/c.   Difficult to place patient Yes         Consultants:  PCCM  Procedures:  None  Antimicrobials:  Completed antibiotic therapy   Subjective: No new events.  He wore more than 5 hours of BiPAP last night.  Objective: Vitals:   06/18/21 1500 06/18/21 2049 06/18/21 2322 06/19/21 0700  BP:  (!) 141/73  111/66  Pulse:  81 79 84  Resp:  16 18 17   Temp:  98.8 F (37.1 C)  98.2 F (36.8 C)  TempSrc:  Oral  Oral  SpO2:  98% 96% 98%  Weight: (!) 270 kg     Height:        Intake/Output Summary (Last 24 hours) at 06/19/2021 1201 Last data filed at 06/18/2021 1500 Gross per 24 hour  Intake --  Output 600 ml  Net -600 ml   Filed Weights   06/18/21 1500  Weight: (!) 270 kg    Examination:  General: Morbidly obese gentleman, on 4 L oxygen.  Looks comfortable. Appropriately anxious. Cardiovascular: S1-S2 normal.  Regular rhythm. Respiratory: Bilateral clear. Gastrointestinal: Soft.  Nontender.  Obese and pendulous.  No localized tenderness.    Data Reviewed: I have personally reviewed following labs and imaging studies  CBC: Recent Labs  Lab 06/14/21 0021  WBC 6.3  HGB 12.1*  HCT 41.5  MCV 95.4  PLT 260   Basic Metabolic Panel:  Recent Labs  Lab 06/13/21 0048 06/14/21 0021 06/15/21 0037  NA 137 136 135  K 3.5 3.8 4.3  CL 88* 89* 88*  CO2 40* 41* 38*  GLUCOSE 122* 116* 112*  BUN 50* 39* 33*  CREATININE 1.17 1.18 0.89  CALCIUM 9.1 8.9 9.1   GFR: Estimated Creatinine Clearance: 217.6 mL/min (by C-G formula based on SCr of 0.89 mg/dL). Liver Function Tests: No results for input(s): AST, ALT, ALKPHOS, BILITOT, PROT, ALBUMIN in the last 168 hours. No results for input(s): LIPASE, AMYLASE in the last 168 hours. No results for input(s): AMMONIA in the last 168 hours. Coagulation Profile: No results for input(s): INR, PROTIME in the last 168 hours. Cardiac  Enzymes: No results for input(s): CKTOTAL, CKMB, CKMBINDEX, TROPONINI in the last 168 hours. BNP (last 3 results) Recent Labs    03/09/21 1232  PROBNP 23.0   HbA1C: No results for input(s): HGBA1C in the last 72 hours. CBG: Recent Labs  Lab 06/14/21 1132 06/14/21 1629  GLUCAP 129* 106*   Lipid Profile: No results for input(s): CHOL, HDL, LDLCALC, TRIG, CHOLHDL, LDLDIRECT in the last 72 hours. Thyroid Function Tests: No results for input(s): TSH, T4TOTAL, FREET4, T3FREE, THYROIDAB in the last 72 hours. Anemia Panel: No results for input(s): VITAMINB12, FOLATE, FERRITIN, TIBC, IRON, RETICCTPCT in the last 72 hours. Sepsis Labs: No results for input(s): PROCALCITON, LATICACIDVEN in the last 168 hours.  No results found for this or any previous visit (from the past 240 hour(s)).       Radiology Studies: No results found.      Scheduled Meds:  (feeding supplement) PROSource Plus  30 mL Oral TID BM   acetaminophen  650 mg Oral QHS   allopurinol  300 mg Oral BID   carvedilol  3.125 mg Oral BID WC   enoxaparin (LOVENOX) injection  100 mg Subcutaneous Q24H   furosemide  40 mg Oral Daily   multivitamin with minerals  1 tablet Oral Daily   potassium chloride  10 mEq Oral Daily   sodium chloride flush  3 mL Intravenous Q12H   Continuous Infusions:   LOS: 19 days    Time spent: 20 minutes    Dorcas Carrow, MD [facility] Pager [phone]

## 2021-06-20 ENCOUNTER — Encounter (HOSPITAL_COMMUNITY): Payer: Self-pay | Admitting: Internal Medicine

## 2021-06-20 DIAGNOSIS — I5031 Acute diastolic (congestive) heart failure: Secondary | ICD-10-CM | POA: Diagnosis not present

## 2021-06-20 NOTE — Plan of Care (Signed)

## 2021-06-20 NOTE — Progress Notes (Signed)
PROGRESS NOTE    Michael Payne  HAL:[id number] DOB: 1971-07-24 DOA: 05/31/2021 PCP: Michael Nam, MD    Brief Narrative:  50 year old with morbid obesity, obstructive sleep apnea not using CPAP at night, hypertension, ambulatory dysfunction admitted to the hospital on 9/12 with shortness of breath.  Presentation blood gas with pH 7.23, PCO2 98.  Initially treated with BiPAP, did not improve so intubated and admitted to ICU.  Extubated on 9/19 after extensive diuresis.  Clinically stabilizing.  Needs to wear CPAP or BiPAP at night but he does not do it.  On oxygen at about 4 L/min. Could not go home because he could not have enough support at him home. 9/30, after much discussion, he started wearing BiPAP.   Assessment & Plan:   Principal Problem:   Acute diastolic CHF (congestive heart failure) (HCC) Active Problems:   HTN (hypertension)   Severe obstructive sleep apnea   Bedbound   Hypoxia   Respiratory failure (HCC)   Pressure injury of skin   Pre-diabetes   Obesity, Class III, BMI 40-49.9 (morbid obesity) (HCC)   Osteoarthritis  Acute on chronic hypoxemic and hypercapnic respiratory failure due to decompensated heart failure,  Obesity hypoventilation syndrome and sleep apnea. Noncompliance to noninvasive ventilator at home.  No started wearing BiPAP. Essential hypertension Acute kidney injury with metabolic alkalosis Super morbid obesity  Plan: Medically stable.  Family not taking him home.  Nursing home not offering any bed offers. Patient cannot mobilize out of the bed and cannot be discharged without a safe disposition. He is at least wearing CPAP these days.      DVT prophylaxis: SCDs Start: 06/01/21 0802   Code Status: Full code Family Communication: Partial, okay for intubation Disposition Plan: Status is: Inpatient  Remains inpatient appropriate because:Unsafe d/c plan  Dispo: The patient is from: Home              Anticipated d/c is to: Home               Patient currently is medically stable to d/c.   Difficult to place patient Yes         Consultants:  PCCM  Procedures:  None  Antimicrobials:  Completed antibiotic therapy   Subjective: No new events.  Started tolerating CPAP at night.  Objective: Vitals:   06/19/21 0700 06/19/21 1500 06/19/21 2102 06/20/21 0907  BP: 111/66 111/76 133/70 95/62  Pulse: 84 78 81 86  Resp: 17 18 16 16   Temp: 98.2 F (36.8 C) 98.2 F (36.8 C) 98.6 F (37 C) 98.3 F (36.8 C)  TempSrc: Oral Oral Oral Oral  SpO2: 98% 96% 97% 97%  Weight:      Height:        Intake/Output Summary (Last 24 hours) at 06/20/2021 1308 Last data filed at 06/20/2021 0736 Gross per 24 hour  Intake --  Output 750 ml  Net -750 ml   Filed Weights   06/18/21 1500  Weight: (!) 270 kg    Examination:  Morbidly obese gentleman.  On 4 L oxygen.  Looks comfortable.  Can talk in full sentences.    Data Reviewed: I have personally reviewed following labs and imaging studies  CBC: Recent Labs  Lab 06/14/21 0021  WBC 6.3  HGB 12.1*  HCT 41.5  MCV 95.4  PLT 260   Basic Metabolic Panel: Recent Labs  Lab 06/14/21 0021 06/15/21 0037  NA 136 135  K 3.8 4.3  CL 89* 88*  CO2 41*  38*  GLUCOSE 116* 112*  BUN 39* 33*  CREATININE 1.18 0.89  CALCIUM 8.9 9.1   GFR: Estimated Creatinine Clearance: 217.6 mL/min (by C-G formula based on SCr of 0.89 mg/dL). Liver Function Tests: No results for input(s): AST, ALT, ALKPHOS, BILITOT, PROT, ALBUMIN in the last 168 hours. No results for input(s): LIPASE, AMYLASE in the last 168 hours. No results for input(s): AMMONIA in the last 168 hours. Coagulation Profile: No results for input(s): INR, PROTIME in the last 168 hours. Cardiac Enzymes: No results for input(s): CKTOTAL, CKMB, CKMBINDEX, TROPONINI in the last 168 hours. BNP (last 3 results) Recent Labs    03/09/21 1232  PROBNP 23.0   HbA1C: No results for input(s): HGBA1C in the last 72  hours. CBG: Recent Labs  Lab 06/14/21 1132 06/14/21 1629  GLUCAP 129* 106*   Lipid Profile: No results for input(s): CHOL, HDL, LDLCALC, TRIG, CHOLHDL, LDLDIRECT in the last 72 hours. Thyroid Function Tests: No results for input(s): TSH, T4TOTAL, FREET4, T3FREE, THYROIDAB in the last 72 hours. Anemia Panel: No results for input(s): VITAMINB12, FOLATE, FERRITIN, TIBC, IRON, RETICCTPCT in the last 72 hours. Sepsis Labs: No results for input(s): PROCALCITON, LATICACIDVEN in the last 168 hours.  No results found for this or any previous visit (from the past 240 hour(s)).       Radiology Studies: No results found.      Scheduled Meds:  (feeding supplement) PROSource Plus  30 mL Oral TID BM   acetaminophen  650 mg Oral QHS   allopurinol  300 mg Oral BID   carvedilol  3.125 mg Oral BID WC   enoxaparin (LOVENOX) injection  100 mg Subcutaneous Q24H   furosemide  40 mg Oral Daily   multivitamin with minerals  1 tablet Oral Daily   potassium chloride  10 mEq Oral Daily   sodium chloride flush  3 mL Intravenous Q12H   Continuous Infusions:   LOS: 20 days    Time spent: 15 minutes    Dorcas Carrow, MD [facility] Pager [phone]

## 2021-06-20 NOTE — Plan of Care (Signed)

## 2021-06-20 NOTE — TOC Progression Note (Signed)
Transition of Care [facility]) - Progression Note    Patient Details  Name: Michael Payne MRN: [id number] Date of Birth: [birth year]  Transition of Care [facility]) CM/SW Contact  Donnalee Curry, LCSWA Phone Number: 06/20/2021, 8:30 AM  Clinical Narrative:     Pt has no current BO's.  Expected Discharge Plan: Home w Home Health Services Barriers to Discharge: Continued Medical Work up  Expected Discharge Plan and Services Expected Discharge Plan: Home w Home Health Services In-house Referral: Clinical Social Work Discharge Planning Services: CM Consult Post Acute Care Choice: Home Health Living arrangements for the past 2 months: Single Family Home Expected Discharge Date: 06/15/21               DME Arranged: Oxygen DME Agency: [employer] Date DME Agency Contacted: 06/14/21 Time DME Agency Contacted: 1434 Representative spoke with at DME Agency: Ian Malkin HH Arranged: RN, Disease Management, PT, Nurse's Aide, Social Work [employer] Agency: [facility] Date [facility] Agency Contacted: 06/14/21 Time HH Agency Contacted: 1434 Representative spoke with at [facility] Agency: Hessie Knows   Social Determinants of Health (SDOH) Interventions    Readmission Risk Interventions No flowsheet data found.

## 2021-06-20 NOTE — Progress Notes (Signed)
Pt states he will place self on cpap when ready.

## 2021-06-21 DIAGNOSIS — I5031 Acute diastolic (congestive) heart failure: Secondary | ICD-10-CM | POA: Diagnosis not present

## 2021-06-21 NOTE — TOC Progression Note (Signed)
Transition of Care [facility]) - Initial/Assessment Note    Patient Details  Name: Michael Payne MRN: [id number] Date of Birth: Feb 08, 1971  Transition of Care [facility]) CM/SW Contact:    Ralene Bathe, LCSWA Phone Number: 06/21/2021, 3:10 PM  Clinical Narrative:                 CSW spoke with Doree Fudge with [facility].  Per Shanda Bumps, there is not enough to meet the medical necessity component for insurance to approve CIR since the patient is just doing bed mobility.  The facility will continue to follow at a distance for any changes.    Expected Discharge Plan: Home w Home Health Services Barriers to Discharge: Continued Medical Work up   Patient Goals and CMS Choice Patient states their goals for this hospitalization and ongoing recovery are:: return home with [facility] CMS Medicare.gov Compare Post Acute Care list provided to:: Patient Choice offered to / list presented to : Patient  Expected Discharge Plan and Services Expected Discharge Plan: Home w Home Health Services In-house Referral: Clinical Social Work Discharge Planning Services: CM Consult Post Acute Care Choice: Home Health Living arrangements for the past 2 months: Single Family Home Expected Discharge Date: 06/15/21               DME Arranged: Oxygen DME Agency: [employer] Date DME Agency Contacted: 06/14/21 Time DME Agency Contacted: 1434 Representative spoke with at DME Agency: Ian Malkin HH Arranged: RN, Disease Management, PT, Nurse's Aide, Social Work [employer] Agency: [facility] Date [facility] Agency Contacted: 06/14/21 Time HH Agency Contacted: 1434 Representative spoke with at [facility] Agency: Hessie Knows  Prior Living Arrangements/Services Living arrangements for the past 2 months: Single Family Home Lives with:: Adult Children, Spouse Patient language and need for interpreter reviewed:: Yes Do you feel safe going back to the place where you live?: Yes      Need for Family Participation in Patient Care:  Yes (Comment) Care giver support system in place?: Yes (comment) Current home services: Home PT, Home RN, Homehealth aide, DME (hospital bed, trapeze bar, hoyer lift, w/chair walker) Criminal Activity/Legal Involvement Pertinent to Current Situation/Hospitalization: No - Comment as needed  Activities of Daily Living Home Assistive Devices/Equipment: Trapeze ADL Screening (condition at time of admission) Patient's cognitive ability adequate to safely complete daily activities?: No Is the patient deaf or have difficulty hearing?: No Does the patient have difficulty seeing, even when wearing glasses/contacts?: No Does the patient have difficulty concentrating, remembering, or making decisions?: No Patient able to express need for assistance with ADLs?: Yes Does the patient have difficulty dressing or bathing?: Yes Independently performs ADLs?: No Communication: Independent Dressing (OT): Dependent Grooming: Dependent Feeding: Needs assistance Is this a change from baseline?: Pre-admission baseline Bathing: Dependent Toileting: Dependent In/Out Bed: Dependent Walks in Home: Dependent Does the patient have difficulty walking or climbing stairs?: Yes Weakness of Legs: Right Weakness of Arms/Hands: None  Permission Sought/Granted Permission sought to share information with : Family Supports, Oceanographer granted to share information with : Yes, Verbal Permission Granted  Share Information with NAME: Michael Payne wife, Michael Payne daughter  Permission granted to share info w AGENCY: SNF  Permission granted to share info w Relationship: wife, daughter  Permission granted to share info w Contact Information: [phone]  Emotional Assessment Appearance:: Appears stated age Attitude/Demeanor/Rapport: Engaged Affect (typically observed): Appropriate Orientation: : Oriented to Self, Oriented to Place, Oriented to  Time, Oriented to Situation Alcohol /  Substance  Use: Not Applicable Psych Involvement: No (comment)  Admission diagnosis:  Respiratory failure (HCC) [J96.90] CHF (congestive heart failure) (HCC) [I50.9] SOB (shortness of breath) [R06.02] Respiration abnormal [R06.9] Hypoxia [R09.02] Patient Active Problem List   Diagnosis Date Noted   Pre-diabetes 06/10/2021   Obesity, Class III, BMI 40-49.9 (morbid obesity) (HCC) 06/10/2021   Osteoarthritis 06/10/2021   Pressure injury of skin 06/09/2021   Respiratory failure (HCC) 06/01/2021   Acute diastolic CHF (congestive heart failure) (HCC) 05/31/2021   CHF (congestive heart failure) (HCC) 05/31/2021   Hypoxia 05/31/2021   Gout, unspecified 03/10/2021   Edema 03/10/2021   Diabetes mellitus without complication (HCC) 03/10/2021   Muscle spasms of both lower extremities 07/29/2020   Bedbound 05/22/2020   Unilateral osteoarthritis of hip, right 12/27/2019   Severe obstructive sleep apnea 02/22/2018   Hypertriglyceridemia 08/24/2017   Low HDL (under 40) 08/24/2017   Metabolic syndrome 08/24/2017   Morbid obesity (HCC) 10/14/2013   HTN (hypertension) 10/14/2013   PCP:  Joaquim Nam, MD Pharmacy:   [facility] [phone] Ginette Otto, [place] - [address] [place] [address] Phone: [phone] Fax: [phone]  [facility] - [place], [place] - [address] [place] [address] Phone: [phone] Fax: [phone]     Social Determinants of Health (SDOH) Interventions    Readmission Risk Interventions No flowsheet data found.

## 2021-06-21 NOTE — Progress Notes (Signed)
PROGRESS NOTE    Michael Payne  WIO:[id number] DOB: 11-17-1970 DOA: 05/31/2021 PCP: Joaquim Nam, MD    Brief Narrative:  50 year old with morbid obesity, obstructive sleep apnea not using CPAP at night, hypertension, ambulatory dysfunction admitted to the hospital on 9/12 with shortness of breath.  Presentation blood gas with pH 7.23, PCO2 98.  Initially treated with BiPAP, did not improve so intubated and admitted to ICU.  Extubated on 9/19 after extensive diuresis.  Clinically stabilizing.  Needs to wear CPAP or BiPAP at night that he is now more compliant. On oxygen at about 4 L/min that he was not using before coming to the hospital. Could not go home because he could not have enough support at him home. 9/30, after much discussion, he started wearing BiPAP and now consistently wearing at night.  Patient has been bedbound for the last 2 years and was living with family.  He tells me that home oxygen supplier told the family that the concentrator will not be supported by their electrical wiring system. Now his family is not able to provide him care.   Assessment & Plan:   Principal Problem:   Acute diastolic CHF (congestive heart failure) (HCC) Active Problems:   HTN (hypertension)   Severe obstructive sleep apnea   Bedbound   Hypoxia   Respiratory failure (HCC)   Pressure injury of skin   Pre-diabetes   Obesity, Class III, BMI 40-49.9 (morbid obesity) (HCC)   Osteoarthritis  Acute on chronic hypoxemic and hypercapnic respiratory failure due to decompensated heart failure,  Obesity hypoventilation syndrome and sleep apnea. Noncompliance to noninvasive ventilator at home.  Now wearing CPAP. Essential hypertension Acute kidney injury with metabolic alkalosis Super morbid obesity  Plan: Medically stable.  Family not taking him home.  Nursing home not offering any beds. Patient cannot mobilize out of the bed and cannot be discharged without a safe disposition. Medically  optimized on current doses of beta-blockers, diuretics and potassium supplement.  We will recheck his electrolytes tomorrow morning.      DVT prophylaxis: SCDs Start: 06/01/21 0802   Code Status: Full code. Partial, okay for intubation Family Communication: None. Disposition Plan: Status is: Inpatient  Remains inpatient appropriate because:Unsafe d/c plan  Dispo: The patient is from: Home              Anticipated d/c is to: Home versus SNF.              Patient currently is medically stable to d/c.   Difficult to place patient Yes         Consultants:  PCCM  Procedures:  None  Antimicrobials:  Completed antibiotic therapy   Subjective: No new events.  He tells me that his family cannot help him.  He cannot move out of the bed.  He still believes that his home electrical system will not support his oxygen concentrator. Patient wore 7 hours of CPAP last night.  Objective: Vitals:   06/19/21 2102 06/20/21 0907 06/20/21 2013 06/21/21 0805  BP: 133/70 95/62 98/68  116/79  Pulse: 81 86 80 77  Resp: 16 16 18 18   Temp: 98.6 F (37 C) 98.3 F (36.8 C) 98.5 F (36.9 C) 98.1 F (36.7 C)  TempSrc: Oral Oral Oral Oral  SpO2: 97% 97% 97% 98%  Weight:      Height:        Intake/Output Summary (Last 24 hours) at 06/21/2021 1341 Last data filed at 06/21/2021 1220 Gross per 24 hour  Intake --  Output 1000 ml  Net -1000 ml   Filed Weights   06/18/21 1500  Weight: (!) 270 kg    Examination:  Morbidly obese gentleman on 4 L oxygen.  Not in any distress.    Data Reviewed: I have personally reviewed following labs and imaging studies  CBC: No results for input(s): WBC, NEUTROABS, HGB, HCT, MCV, PLT in the last 168 hours.  Basic Metabolic Panel: Recent Labs  Lab 06/15/21 0037  NA 135  K 4.3  CL 88*  CO2 38*  GLUCOSE 112*  BUN 33*  CREATININE 0.89  CALCIUM 9.1   GFR: Estimated Creatinine Clearance: 217.6 mL/min (by C-G formula based on SCr of 0.89  mg/dL). Liver Function Tests: No results for input(s): AST, ALT, ALKPHOS, BILITOT, PROT, ALBUMIN in the last 168 hours. No results for input(s): LIPASE, AMYLASE in the last 168 hours. No results for input(s): AMMONIA in the last 168 hours. Coagulation Profile: No results for input(s): INR, PROTIME in the last 168 hours. Cardiac Enzymes: No results for input(s): CKTOTAL, CKMB, CKMBINDEX, TROPONINI in the last 168 hours. BNP (last 3 results) Recent Labs    03/09/21 1232  PROBNP 23.0   HbA1C: No results for input(s): HGBA1C in the last 72 hours. CBG: Recent Labs  Lab 06/14/21 1629  GLUCAP 106*   Lipid Profile: No results for input(s): CHOL, HDL, LDLCALC, TRIG, CHOLHDL, LDLDIRECT in the last 72 hours. Thyroid Function Tests: No results for input(s): TSH, T4TOTAL, FREET4, T3FREE, THYROIDAB in the last 72 hours. Anemia Panel: No results for input(s): VITAMINB12, FOLATE, FERRITIN, TIBC, IRON, RETICCTPCT in the last 72 hours. Sepsis Labs: No results for input(s): PROCALCITON, LATICACIDVEN in the last 168 hours.  No results found for this or any previous visit (from the past 240 hour(s)).       Radiology Studies: No results found.      Scheduled Meds:  (feeding supplement) PROSource Plus  30 mL Oral TID BM   acetaminophen  650 mg Oral QHS   allopurinol  300 mg Oral BID   carvedilol  3.125 mg Oral BID WC   enoxaparin (LOVENOX) injection  100 mg Subcutaneous Q24H   furosemide  40 mg Oral Daily   multivitamin with minerals  1 tablet Oral Daily   potassium chloride  10 mEq Oral Daily   sodium chloride flush  3 mL Intravenous Q12H   Continuous Infusions:   LOS: 21 days    Time spent: 15 minutes    Dorcas Carrow, MD [facility] Pager [phone]

## 2021-06-21 NOTE — Progress Notes (Signed)
Patient to self administer nocturnal bipap for sleep apnea.  Patient is familiar with equipment and procedure and able to apply and remove mask on his own.

## 2021-06-22 ENCOUNTER — Telehealth: Payer: Medicare HMO

## 2021-06-22 DIAGNOSIS — I5031 Acute diastolic (congestive) heart failure: Secondary | ICD-10-CM | POA: Diagnosis not present

## 2021-06-22 NOTE — Progress Notes (Signed)
Physical Therapy Treatment Patient Details Name: Michael Payne MRN: [id number] DOB: 12/27/1970 Today's Date: 06/22/2021   History of Present Illness Pt is a 50 y.o. male admitted 05/31/21 with SOB, cough, orthopnea. Workup for acute on chronic hypoxemic and hypercarbic respiratory failure. ETT 9/13-9/19. PMH includes morbid obesity, DM2, OSA (non compliant CPAP), HTN, chronic narcotic use for LE pain, bedbound x 2 years.    PT Comments    Continuing work on functional mobility and activity tolerance;  Session focused on bed mobility and assisting pt with hygeine needs as he needed to move his bowels;   Notable that Cullan is independent and quite good at maneuvering his bed for mobility; he participates well; Agreed to sit up on the EOB, but then we stopped because he needed to have a BM;  Worth considering moving him to a room with a [place] that has 1000 lb capacity to use the lift for consistently getting him OOB to the recliner   Recommendations for follow up therapy are one component of a multi-disciplinary discharge planning process, led by the attending physician.  Recommendations may be updated based on patient status, additional functional criteria and insurance authorization.  Follow Up Recommendations  Supervision for mobility/OOB     Equipment Recommendations  None recommended by PT    Recommendations for Other Services       Precautions / Restrictions Precautions Precautions: Fall;Other (comment) Precaution Comments: Has been bedbound for ~2 years; chronic R hip pain     Mobility  Bed Mobility Overal bed mobility: Needs Assistance Bed Mobility: Rolling Rolling: +2 for physical assistance;Mod assist;Min assist         General bed mobility comments: Practiced rolling both sides x 2.  Mod A x2 to R and Min A x 2 to L .  Required assist for crossing legs and to reach rail.  Once in sidelying pt able to maintain on his own for several mins to right to place bedpan,  help with hygeine, and  change pads    Transfers                    Ambulation/Gait                 Stairs             Wheelchair Mobility    Modified Rankin (Stroke Patients Only)       Balance                                            Cognition Arousal/Alertness: Awake/alert Behavior During Therapy: WFL for tasks assessed/performed Overall Cognitive Status: Within Functional Limits for tasks assessed                                 General Comments: Very pleasant and motivated      Exercises      General Comments General comments (skin integrity, edema, etc.): Session conducted on Room Air; Majority of session pt's O2 sats remained greater tahn or equal to 92%; Noted short time period when O2 sats ranged 87-91% -- this was after rolling and holding L sidelying for a few minutes      Pertinent Vitals/Pain Pain Assessment: Faces Faces Pain Scale: Hurts little more Pain Location: R hip Pain Descriptors / Indicators: Grimacing;Guarding Pain  Intervention(s): Limited activity within patient's tolerance    Home Living                      Prior Function            PT Goals (current goals can now be found in the care plan section) Acute Rehab PT Goals Patient Stated Goal: return home PT Goal Formulation: With patient Time For Goal Achievement: 06/18/21 Potential to Achieve Goals: Fair Progress towards PT goals: Progressing toward goals (Goal update done)    Frequency    Min 2X/week      PT Plan Current plan remains appropriate    Co-evaluation              AM-PAC PT "6 Clicks" Mobility   Outcome Measure  Help needed turning from your back to your side while in a flat bed without using bedrails?: A Lot Help needed moving from lying on your back to sitting on the side of a flat bed without using bedrails?: Total Help needed moving to and from a bed to a chair (including a  wheelchair)?: Total Help needed standing up from a chair using your arms (e.g., wheelchair or bedside chair)?: Total Help needed to walk in hospital room?: Total Help needed climbing 3-5 steps with a railing? : Total 6 Click Score: 7    End of Session   Activity Tolerance: Patient tolerated treatment well Patient left: in bed;with call bell/phone within reach Nurse Communication: Mobility status PT Visit Diagnosis: Other abnormalities of gait and mobility (R26.89)     Time: 3846-6599 PT Time Calculation (min) (ACUTE ONLY): 39 min  Charges:  $Therapeutic Activity: 38-52 mins                     Van Clines, PT  Acute Rehabilitation Services Pager [phone] Office [phone]    Levi Aland 06/22/2021, 2:33 PM

## 2021-06-22 NOTE — Progress Notes (Signed)
Nutrition Follow-up  DOCUMENTATION CODES:   Morbid obesity  INTERVENTION:   -Continue MVI with minerals daily -Continue 30 ml Prosource plus TID, each supplement provides 100 kcals and 15 grams protein -Continue double protein portions with meals   NUTRITION DIAGNOSIS:   Increased nutrient needs related to acute illness as evidenced by estimated needs.  Ongoing  GOAL:   Patient will meet greater than or equal to 90% of their needs  Progressing  MONITOR:   PO intake, Supplement acceptance, Skin, I & O's  REASON FOR ASSESSMENT:   Consult, Ventilator Enteral/tube feeding initiation and management  ASSESSMENT:   Patient with PMH significant for GERD, HTN, DM, chronic R hip pain, and OSA requiring CPAP. Presents this admission with respiratory failure secondary to CHF exacerbation and noncompliance with CPAP at home.  Reviewed I/O's: -1.9 L x 24 hours and -15.4 L since 06/08/21  UOP: 1.9 L x 24 hours  Pt remains with good appetite. Noted meal completions 30-100%. Pt is taking Prosource supplements.   Pt is medically stable for discharge, however, waiting safe discharge disposition.   Medications reviewed.   Labs reviewed: CBGS: 106-129 (inpatient orders for glycemic control are none).    Diet Order:   Diet Order             Diet Heart Room service appropriate? Yes; Fluid consistency: Thin  Diet effective now                   EDUCATION NEEDS:   No education needs have been identified at this time  Skin:  Skin Assessment: Skin Integrity Issues: Skin Integrity Issues:: Stage II Stage II: R thigh  Last BM:  06/21/21  Height:   Ht Readings from Last 1 Encounters:  06/02/21 5\' 11"  (1.803 m)    Weight:   Wt Readings from Last 1 Encounters:  06/18/21 (!) 270 kg   BMI:  Body mass index is 83.02 kg/m.  Estimated Nutritional Needs:   Kcal:  2500-2700 kcal  Protein:  125-145 grams  Fluid:  >/= 2 L/day    06/20/21, RD, LDN,  CDCES Registered Dietitian II Certified Diabetes Care and Education Specialist Please refer to [facility] for RD and/or RD on-call/weekend/after hours pager

## 2021-06-22 NOTE — Progress Notes (Signed)
[facility] PROGRESS NOTE  [name]:[id number] DOB: 06/12/1971 DOA: 05/31/2021 PCP: Joaquim Nam, MD  Status Remains inpatient appropriate because:Unsafe d/c plan  Dispo: The patient is from: Home              Anticipated d/c is to: SNF              Patient currently is medically stable to d/c.   Difficult to place patient Yes                Barriers to discharge: Morbid obesity/requirement for nocturnal BiPAP  Level of care: Med-Surg   Code Status: Partial: No CPR and no defibrillation or cardioversion Family Communication:  DVT prophylaxis: Lovenox COVID vaccination status: Unknown   HPI: 50 year old with morbid obesity, obstructive sleep apnea not using CPAP at night, hypertension, ambulatory dysfunction admitted to the hospital on 9/12 with shortness of breath.  Presentation blood gas with pH 7.23, PCO2 98.  Initially treated with BiPAP, did not improve so intubated and admitted to ICU.  Extubated on 9/19 after extensive diuresis.   Clinically stabilizing.  Needs to wear CPAP or BiPAP at night that he is now more compliant. On oxygen at about 4 L/min that he was not using before coming to the hospital. Could not go home because he could not have enough support at him home. 9/30, after much discussion, he started wearing BiPAP and now consistently wearing at night.   Patient has been bedbound for the last 2 years and was living with family.  He tells me that home oxygen supplier told the family that the concentrator will not be supported by their electrical wiring system. Now his family is not able to provide him care.  Subjective: Patient alert and oriented.  No specific complaints.  Very motivated to try to increase mobilization but also states he has not walked for at least 2 years and has been in the bed.  Staff aware of plans to get patient out of bed to a chair with a specialized lift from the ER design for significantly obese patients.  Given  circumstances of presentation patient reports he has been 100% compliant with CPAP but according to documentation patient has been only using nasal cannula oxygen (suspect the documentation of high flow oxygen and accurate)  Objective: Vitals:   06/21/21 2019 06/21/21 2047  BP: 117/72   Pulse: 80 84  Resp: 18 20  Temp: 98.4 F (36.9 C)   SpO2: 98% 96%    Intake/Output Summary (Last 24 hours) at 06/22/2021 0831 Last data filed at 06/22/2021 0700 Gross per 24 hour  Intake --  Output 1900 ml  Net -1900 ml   Filed Weights   06/18/21 1500  Weight: (!) 270 kg    Exam:  Constitutional: NAD, calm, comfortable Respiratory: clear to auscultation bilaterally, no wheezing, no crackles. Normal respiratory effort. No accessory muscle use.  Cardiovascular: Regular rate and rhythm, no murmurs / rubs / gallops. No extremity edema. 2+ pedal pulses. No carotid bruits.  Abdomen: no tenderness, no masses palpated. No hepatosplenomegaly. Bowel sounds positive.  Neurologic: CN 2-12 grossly intact. Sensation intact, DTR normal. Strength 5/5 x all 4 extremities.  Psychiatric: Normal judgment and insight. Alert and oriented x 3. Normal mood.    Assessment/Plan: Acute problems: Acute hypoxemic and hypercapnic respiratory failure due to decompensated diastolic heart failure.  Obesity hypoventilation, obesity class III. Patient initially admitted to intensive care unit, he received aggressive diuresis and successfully liberated from invasive  mechanical ventilation.  He is -27 L admission Noted with normal LV and RV systolic function Patient had been consistently refusing BiPAP during the hospitalization but respiratory documented on 9/29 patient requested BiPAP and apparently utilized throughout the entire night.  Since that time patient has been self administering nocturnal BiPAP for sleep apnea as documented in the respiratory therapy note Patient will continue taking furosemide 40 mg daily and  carvedilol twice daily.   Hypertension.   Continue amlodipine.   Pre-type 2 diabetes mellitus.   Hemoglobin A1c 5.9. Initiated on metformin this admission but discontinued due to abdominal comfort Continue dulaglutide.    Right posterior thigh pressure ulcer stage II, POA Continue local wound care.   Acute kidney injury, metabolic alkalosis.   Resolved after aggressive diuresis and improved renal flow Last creatinine on 9/27 was normal at 0.89 with a BUN of 33-[name] in a.m. 10/5   Obesity class III. Obstructive sleep apnea, obesity hypoventilation syndrome   Patient has been bedbound due to severe obesity and hip osteoarthritis. Patient has been seen by [facility] [place] [facility].  He will follow-up after discharge from the hospital  Continue BiPAP as above    Data Reviewed: BNP (last 3 results) Recent Labs    05/31/21 1544  BNP 231.8*    ProBNP (last 3 results) Recent Labs    03/09/21 1232  PROBNP 23.0     Scheduled Meds:  (feeding supplement) PROSource Plus  30 mL Oral TID BM   acetaminophen  650 mg Oral QHS   allopurinol  300 mg Oral BID   carvedilol  3.125 mg Oral BID WC   enoxaparin (LOVENOX) injection  100 mg Subcutaneous Q24H   furosemide  40 mg Oral Daily   multivitamin with minerals  1 tablet Oral Daily   potassium chloride  10 mEq Oral Daily   sodium chloride flush  3 mL Intravenous Q12H     Principal Problem:   Acute diastolic CHF (congestive heart failure) (HCC) Active Problems:   HTN (hypertension)   Severe obstructive sleep apnea   Bedbound   Hypoxia   Respiratory failure (HCC)   Pressure injury of skin   Pre-diabetes   Obesity, Class III, BMI 40-49.9 (morbid obesity) (HCC)   Osteoarthritis   Consultants: PCCM  Procedures: Echocardiogram  Antibiotics: Azithromycin 9/14 through 9/18 Ceftriaxone 9/14 through 9/18   Time spent: 5 minutes    Junious Silk ANP  [facility] 7 am - 330 pm/M-F for direct  patient care and secure chat Please refer to Amion for contact info 22  days

## 2021-06-22 NOTE — TOC Transition Note (Signed)
Transition of Care [facility]) - CM/SW Discharge Note   Patient Details  Name: Michael Payne MRN: [id number] Date of Birth: 1971/07/07  [facility]) CM/SW Contact:  Janae Bridgeman, RN Phone Number: 06/22/2021, 2:58 PM   Clinical Narrative:    Case management spoke with the patient at the bedside to discuss transitions of care to home since no SNF facilities have offered an admission bed to the patient at this time.  The patient's BMI, immobility and needs for BIPAP are the main barriers at this point.    CM called and spoke with the patient's wife, Michael Payne and she is aware that I have been unable to find an accepting SNF facility for admission and the patient will need to discharge home with previous home services.  The patient's wife requested increased PCS services.  CM called and spoke with Kandee Keen at [facility] services and a referral for CAP services was placed.  I also spoke with [facility] and [employer] and patient is currently receiving maximum PCS Services at this time.  I called and spoke with [place], CM at [facility] and requested maximum home health services including  RN, PT, OT, aide, MSW.  I called [employer] and spoke with Zack, CM and plan to follow up with him tomorrow to request BIPAP delivery to the home since the patient currently has CPAP only.  CM and MSW with DTP Team will continue to follow the patient for discharge needs for home.   Final next level of care: Skilled Nursing Facility Barriers to Discharge: No SNF bed   Patient Goals and CMS Choice Patient states their goals for this hospitalization and ongoing recovery are:: Patient is agreeable to SNF placement. CMS Medicare.gov Compare Post Acute Care list provided to:: Patient Choice offered to / list presented to : Patient  Discharge Placement                       Discharge Plan and Services In-house Referral: Clinical Social Work Discharge Planning Services: CM  Consult Post Acute Care Choice: Skilled Nursing Facility          DME Arranged: Oxygen DME Agency: [employer] Date DME Agency Contacted: 06/14/21 Time DME Agency Contacted: 1434 Representative spoke with at DME Agency: Ian Malkin HH Arranged: RN, Disease Management, PT, Nurse's Aide, Social Work [employer] Agency: [facility] Date [facility] Agency Contacted: 06/14/21 Time HH Agency Contacted: 1434 Representative spoke with at [facility] Agency: Hessie Knows  Social Determinants of Health (SDOH) Interventions     Readmission Risk Interventions No flowsheet data found.

## 2021-06-22 NOTE — TOC Progression Note (Addendum)
  Transition of Care [facility]) - Progression Note    Patient Details  Name: Michael Payne MRN: [id number] Date of Birth: 1971-08-12  Transition of [facility]) CM/SW Contact  Curlene Labrum, RN Phone Number: 06/22/2021, 10:59 AM  Clinical Narrative:    Case management met with the patient at the bedside to discuss transitions of care.  The patient states that he is agreeable to [facility] placement if CM/MSW is able to find a SNF facility to care for his needs.  The patient is unvaccinated for COVID but is considering vaccination at this time with pending bed offer.  The patient has been bed bound for the past 2 years and the patient's family is unable to care for him in the home - including support of BPAP and oxygen needs at the home per CM notes.  CM spoke with SNF facilities including: Scherrie Merritts, CM at [facility] in [place] - unable to care for needs - declined [place] - left message with Baird Lyons, admissions - [phone] [facility] - resent in [employer] - left message with Gayla Medicus, CM with Choice SNF facilities [facility] - resent in hub [facility] - left message with Prem and Raquel Sarna, CM - resent in HUB  CM and MSW with DTP will continue to follow for TOC needs and probable SNF placement.  06/22/2021 1134 - CM spoke with Raquel Sarna, RNCM at [facility] and patient's BMI is over the limit for safe care on the LTAC side - [place], [facility] will check with [facility] about BMI limits for care and bed availability.  Will follow up.    CM called and spoke with Bethanne Ginger, CM with [employer] to ask about BPAP safety at the home as well since previous CM note states problem with electrical issues with oxygen support at the home.  CM and MSW with DTP Team will continue to follow the patient for [facility] placement if possible.  06/22/2021  1148 - CM spoke with Arley Phenix, CM with [facility] and asked about possibility of LTAC placement or SNF facilities  in [place] state able to support care considering patient's BMI.  CM will continue to follow the patient for TOC needs.    Expected Discharge Plan: Skilled Nursing Facility Barriers to Discharge: No SNF bed  Expected Discharge Plan and Services Expected Discharge Plan: [place] In-house Referral: Clinical Social Work Discharge Planning Services: CM Consult Post Acute Care Choice: [place] arrangements for the past 2 months: [place] Expected Discharge Date: 06/15/21               DME Arranged: Oxygen DME Agency: [employer] Date DME Agency Contacted: 06/14/21 Time DME Agency Contacted: [id number] Representative spoke with at DME Agency: Thedore Mins HH Arranged: RN, Disease Management, PT, Nurse's Aide, Social Work [employer] Agency: [place] Date [place]: 06/14/21 Time [place]: 1434 Representative spoke with at [place]: [place] ([place]) Interventions    Readmission Risk Interventions No flowsheet data found.

## 2021-06-22 NOTE — Plan of Care (Signed)

## 2021-06-23 DIAGNOSIS — I5031 Acute diastolic (congestive) heart failure: Secondary | ICD-10-CM | POA: Diagnosis not present

## 2021-06-23 LAB — COMPREHENSIVE METABOLIC PANEL
ALT: 165 U/L — ABNORMAL HIGH (ref 0–44)
AST: 86 U/L — ABNORMAL HIGH (ref 15–41)
Albumin: 3.2 g/dL — ABNORMAL LOW (ref 3.5–5.0)
Alkaline Phosphatase: 44 U/L (ref 38–126)
Anion gap: 12 (ref 5–15)
BUN: 21 mg/dL — ABNORMAL HIGH (ref 6–20)
CO2: 31 mmol/L (ref 22–32)
Calcium: 8.9 mg/dL (ref 8.9–10.3)
Chloride: 93 mmol/L — ABNORMAL LOW (ref 98–111)
Creatinine, Ser: 0.97 mg/dL (ref 0.61–1.24)
GFR, Estimated: >60 mL/min (ref >60)
Glucose, Bld: 113 mg/dL — ABNORMAL HIGH (ref 70–99)
Potassium: 4 mmol/L (ref 3.5–5.1)
Sodium: 136 mmol/L (ref 135–145)
Total Bilirubin: 0.5 mg/dL (ref 0.3–1.2)
Total Protein: 6.9 g/dL (ref 6.5–8.1)

## 2021-06-23 LAB — CBC WITH DIFFERENTIAL/PLATELET
Abs Immature Granulocytes: 0.02 10*3/uL (ref 0.00–0.07)
Basophils Absolute: 0.1 10*3/uL (ref 0.0–0.1)
Basophils Relative: 1 %
Eosinophils Absolute: 0.4 10*3/uL (ref 0.0–0.5)
Eosinophils Relative: 7 %
HCT: 42.8 % (ref 39.0–52.0)
Hemoglobin: 12.5 g/dL — ABNORMAL LOW (ref 13.0–17.0)
Immature Granulocytes: 0 %
Lymphocytes Relative: 28 %
Lymphs Abs: 1.8 10*3/uL (ref 0.7–4.0)
MCH: 27.6 pg (ref 26.0–34.0)
MCHC: 29.2 g/dL — ABNORMAL LOW (ref 30.0–36.0)
MCV: 94.5 fL (ref 80.0–100.0)
Monocytes Absolute: 0.8 10*3/uL (ref 0.1–1.0)
Monocytes Relative: 13 %
Neutro Abs: 3.2 10*3/uL (ref 1.7–7.7)
Neutrophils Relative %: 51 %
Platelets: 234 10*3/uL (ref 150–400)
RBC: 4.53 MIL/uL (ref 4.22–5.81)
RDW: 15.9 % — ABNORMAL HIGH (ref 11.5–15.5)
WBC: 6.3 10*3/uL (ref 4.0–10.5)
nRBC: 0 % (ref 0.0–0.2)

## 2021-06-23 NOTE — Progress Notes (Signed)
Pt stated he was able to place self on bipap for the night when ready for bed. RT told pt to call if he needed anything throughout the night.

## 2021-06-23 NOTE — Plan of Care (Signed)

## 2021-06-23 NOTE — Plan of Care (Signed)

## 2021-06-23 NOTE — Progress Notes (Signed)
[name] presents with obesity hypoventilation syndrome.  The use of the NIV will treat patients high PC02 levels (65.8 on 06/07/21 with elevated bicarb of 36.3) and can reduce risk of exacerbations and future hospitalizations when used at night and during the day.  All alternate devices [id number] and [id number]) have been proven ineffective to provide essential volume control necessary to maintain acceptable CO2 levels. An NIV with AVAPS AE is necessary to prevent patient harm.  Interruption or failure to provide NIV would quickly lead to exacerbation of the patients condition, hospital admission, and likely harm to the patient. Continued use is preferred.  Patient is able to protect their airways and clear secretions on their own.

## 2021-06-23 NOTE — Progress Notes (Addendum)
[facility] PROGRESS NOTE  Michael Payne [id number] DOB: 04-07-1971 DOA: 05/31/2021 PCP: Joaquim Nam, MD  Status Remains inpatient appropriate because:Unsafe d/c plan  Dispo: The patient is from: Home              Anticipated d/c is to: SNF              Patient currently is medically stable to d/c.   Difficult to place patient Yes                Barriers to discharge: Morbid obesity/requirement for nocturnal BiPAP  Level of care: Med-Surg   Code Status: Partial: No CPR and no defibrillation or cardioversion Family Communication:  DVT prophylaxis: Lovenox COVID vaccination status: Unknown   HPI: 50 year old with morbid obesity, obstructive sleep apnea not using CPAP at night, hypertension, ambulatory dysfunction admitted to the hospital on 9/12 with shortness of breath.  Presentation blood gas with pH 7.23, PCO2 98.  Initially treated with BiPAP, did not improve so intubated and admitted to ICU.  Extubated on 9/19 after extensive diuresis.   Clinically stabilizing.  Needs to wear CPAP or BiPAP at night that he is now more compliant. On oxygen at about 4 L/min that he was not using before coming to the hospital. Could not go home because he could not have enough support at him home. 9/30, after much discussion, he started wearing BiPAP and now consistently wearing at night.   Patient has been bedbound for the last 2 years and was living with family.  He tells me that home oxygen supplier told the family that the concentrator will not be supported by their electrical wiring system. Now his family is not able to provide him care.  Subjective: No specific complaints.  Patient eager to discharge back to home on Monday  Objective: Vitals:   06/22/21 2157 06/23/21 0737  BP: 106/65 109/75  Pulse: 92 93  Resp: 16 17  Temp: 98.9 F (37.2 C) 98.1 F (36.7 C)  SpO2: 93% 92%   No intake or output data in the 24 hours ending 06/23/21 0818  Filed Weights   06/18/21  1500  Weight: (!) 270 kg    Exam:  Constitutional: NAD, calm, comfortable Respiratory: Anterior lung sounds are clear but diminished in the bases.  Exam limited by body habitus.  Currently on room air.  Compliant with nocturnal BiPAP Cardiovascular: Normal heart sounds, no peripheral edema, regular pulse Abdomen: no tenderness, no masses palpated-Bowel sounds positive. LBM 10/5 Neurologic: CN 2-12 grossly intact. Sensation intact, DTR normal. Strength 3-4/5 x all 4 extremities.  Psychiatric: Normal judgment and insight. Alert and oriented x 3. Normal mood.    Assessment/Plan: Acute problems: Acute hypoxemic and hypercapnic respiratory failure due to decompensated diastolic heart failure.  Obesity hypoventilation, obesity class III. Patient initially admitted to intensive care unit, he received aggressive diuresis and successfully liberated from invasive mechanical ventilation.  He is -27 L admission Noted with normal LV and RV systolic function Patient had been consistently refusing BiPAP during the hospitalization but respiratory documented on 9/29 patient requested BiPAP and apparently utilized throughout the entire night.  Since that time patient has been self administering nocturnal BiPAP for sleep apnea (5 L O2 bleed in, IPAP 16, EPAP 6) Discussed w/ HH RT-trying to obtain TRILOGY DEVICE instead of BiPAP Patient will continue taking furosemide 40 mg daily and carvedilol twice daily.   Hypertension.   Continue amlodipine.   Pre-type 2 diabetes mellitus.   Hemoglobin  A1c 5.9. Initiated on metformin this admission but discontinued due to abdominal discomfort Continue dulaglutide.    Right posterior thigh pressure ulcer stage II, POA Continue local wound care.   Acute kidney injury, metabolic alkalosis.   Resolved after aggressive diuresis and improved renal flow Last creatinine on 9/27 was normal at 0.89 with a BUN of 33-[name] in a.m. 10/5   Obesity class III. Obstructive sleep  apnea, obesity hypoventilation syndrome   Patient has been bedbound due to severe obesity and hip osteoarthritis. Patient has been seen by the [place] [facility]ariat[facility].  He will follow-up after discharge from the hospital  Continue BiPAP as above    Data Reviewed: BNP (last 3 results) Recent Labs    05/31/21 1544  BNP 231.8*    ProBNP (last 3 results) Recent Labs    03/09/21 1232  PROBNP 23.0     Scheduled Meds:  (feeding supplement) PROSource Plus  30 mL Oral TID BM   acetaminophen  650 mg Oral QHS   allopurinol  300 mg Oral BID   carvedilol  3.125 mg Oral BID WC   enoxaparin (LOVENOX) injection  100 mg Subcutaneous Q24H   furosemide  40 mg Oral Daily   multivitamin with minerals  1 tablet Oral Daily   potassium chloride  10 mEq Oral Daily   sodium chloride flush  3 mL Intravenous Q12H     Principal Problem:   Acute diastolic CHF (congestive heart failure) (HCC) Active Problems:   HTN (hypertension)   Severe obstructive sleep apnea   Bedbound   Hypoxia   Respiratory failure (HCC)   Pressure injury of skin   Pre-diabetes   Obesity, Class III, BMI 40-49.9 (morbid obesity) (HCC)   Osteoarthritis   Consultants: PCCM  Procedures: Echocardiogram  Antibiotics: Azithromycin 9/14 through 9/18 Ceftriaxone 9/14 through 9/18   Time spent: 5 minutes    Junious Silk ANP  [facility] 7 am - 330 pm/M-F for direct patient care and secure chat Please refer to Amion for contact info 23  days

## 2021-06-23 NOTE — TOC Progression Note (Signed)
Transition of Care [facility]) - Progression Note    Patient Details  Name: Michael Payne MRN: [id number] Date of Birth: Feb 08, 1971  Transition [facility]) CM/SW Contact  Janae Bridgeman, RN Phone Number: 06/23/2021, 10:53 AM  Clinical Narrative:    CM spoke with the patient at the bedside with Edwin Dada, MSW to explain transitions of care plans for home on Monday, 06/28/2021.  I spoke with the patient and explained that [facility] Team was unable to find accepting SNF facility for care due to patient's care needs - including BMI, need for bariatric specialty equipment and BPAP needs at night.  I explained this to the patient and to the wife yesterday and they are both aware.  The patient's wife states that she is upset that the facilities were unable to provide care but that the only way she would approve for him to come home is if he is given increased home health care.  I explained to her that the patient is getting PCS Services through [facility] at 80 hours per week, but that I sent in a referral through [place], CM at [facility] department to start CAP services ASAP in the next 3-4 months when available.  The application was placed over the phone.  I called [place], RNCM at [facility] and they have increased provision of services to include [facility] RN, PT, OT, aide, MSW to assist until CAP services become available.  CM called and spoke with Jenness Corner, CM with [employer] and the patient presently has a CPAP machine and medium full face mask along with two portable oxygen tanks and 10 liter oxygen concentrator at home.  The patient will need to be supplied with a trilogy vent for BIPAP support and will need to be approved by insurance first and delivered to the home prior to patient discharging home.  A DME narrative was placed and co-signed by Junious Silk, NP and will be provided to [employer] to process request for trilogy ventilator.  This may take 3-5 days to acquire insurance approval since the patient does not  currently have a documented sleep study.  Junious Silk, NP is aware.  CM and MSW will continue to follow the patient for discharge home - likely Monday 06/28/2021 once a trilogy can be approved and delivered to the home - along with education and RT support through [employer].   Expected Discharge Plan: Home w Home Health Services Barriers to Discharge: Continued Medical Work up, Other (must enter comment) (equipment delivery and home health provider - planned discharge for Monday, 06/28/2021)  Expected Discharge Plan and Services Expected Discharge Plan: Home w Home Health Services In-house Referral: Clinical Social Work Discharge Planning Services: CM Consult Post Acute Care Choice: Home Health Living arrangements for the past 2 months: Single Family Home Expected Discharge Date: 06/15/21               DME Arranged: Weldon Inches, Oxygen DME Agency: [facility] (Called Jenness Corner at [employer] to f/u regarding equipment delivery to the home.) Date DME Agency Contacted: 06/23/21 Time DME Agency Contacted: 1050 Representative spoke with at DME Agency: Oletha Cruel, CM with [employer] HH Arranged: RN, Nurse's Aide, CAPS Program, PT, OT, Social Work (CAP referral sent through [place], CM at [employer] program) [facility] Agency: [facility] Date [facility] Agency Contacted: 06/22/21 Time HH Agency Contacted: 1500 Representative spoke with at [facility] Agency: [place], CM at [facility]   Social Determinants of Health (SDOH) Interventions    Readmission Risk Interventions Readmission Risk Prevention  Plan 06/23/2021  Post Dischage Appt Complete  Medication Screening Complete  Transportation Screening Complete  Some recent data might be hidden

## 2021-06-23 NOTE — Progress Notes (Signed)
CSW and RN CM met with patient at bedside to have discussion regarding discharge planning. Patient agreeable to discharge home on Monday 06/28/21 with home health and PCS services in place. RN CM submitted CAP application as well. Patient will communicate with his wife and adult daughter directly regarding plan.  Madilyn Fireman, MSW, LCSW Transitions of Care  Clinical Social Worker II [phone]

## 2021-06-24 DIAGNOSIS — I5031 Acute diastolic (congestive) heart failure: Secondary | ICD-10-CM | POA: Diagnosis not present

## 2021-06-24 LAB — HEPARIN ANTI-XA: Heparin LMW: 0.26 IU/mL

## 2021-06-24 MED ORDER — ALLOPURINOL 100 MG PO TABS
100.0000 mg | ORAL_TABLET | Freq: Every day | ORAL | Status: DC
  Administered 2021-06-25 – 2021-06-28 (×4): 100 mg via ORAL
  Filled 2021-06-24 (×4): qty 1

## 2021-06-24 MED ORDER — ALLOPURINOL 100 MG PO TABS: 100.0000 mg | ORAL_TABLET | Freq: Every day | ORAL | 0 refills | Status: DC

## 2021-06-24 MED ORDER — ACETAMINOPHEN 325 MG PO TABS
650.0000 mg | ORAL_TABLET | Freq: Four times a day (QID) | ORAL | Status: DC | PRN
  Administered 2021-06-24 – 2021-06-28 (×8): 650 mg via ORAL
  Filled 2021-06-24 (×8): qty 2

## 2021-06-24 MED ORDER — ACETAMINOPHEN 325 MG PO TABS: 650.0000 mg | ORAL_TABLET | Freq: Four times a day (QID) | ORAL | Status: DC | PRN

## 2021-06-24 NOTE — Plan of Care (Signed)

## 2021-06-24 NOTE — TOC Progression Note (Signed)
Transition of Care [facility]) - Progression Note    Patient Details  Name: Michael Payne MRN: [id number] Date of Birth: July 10, 1971  Transition of Care [facility]) CM/SW Contact  Janae Bridgeman, RN Phone Number: 06/24/2021, 2:07 PM  Clinical Narrative:    Case management spoke with Jenness Corner, CM with [employer] and the patient's insurance has approved the patient for Bipap.  I asked that the BIPAP equipment be delivered to the patient's hospital room so that he can receive instruction in the hospital room prior to discharging home on Monday since the patient will be using his own Bipap at home.  I spoke with the patient and the wife at the bedside and explained Bipap equipment approval and other discharge services for the patient pending his discharge to the home on Monday.  The patient and wife, present in the room are aware and answered their questions regarding transitions of care plan and discharge on Monday morning by PTAR.  CM and MSW with DTP Team will continue to follow the patient for discharge to home pending Monday, 06/28/2021.   Expected Discharge Plan: Home w Home Health Services Barriers to Discharge: Continued Medical Work up, Other (must enter comment) (equipment delivery and home health provider - planned discharge for Monday, 06/28/2021)  Expected Discharge Plan and Services Expected Discharge Plan: Home w Home Health Services In-house Referral: Clinical Social Work Discharge Planning Services: CM Consult Post Acute Care Choice: Home Health Living arrangements for the past 2 months: Single Family Home Expected Discharge Date: 06/15/21               DME Arranged: Weldon Inches, Oxygen DME Agency: [facility] (Called Jenness Corner at [employer] to f/u regarding equipment delivery to the home.) Date DME Agency Contacted: 06/23/21 Time DME Agency Contacted: 1050 Representative spoke with at DME Agency: Oletha Cruel, CM with [employer] HH Arranged: RN, Nurse's Aide, CAPS Program, PT, OT, Social Work  (CAP referral sent through [place], CM at [employer] program) [facility] Agency: [facility] Date [facility] Agency Contacted: 06/22/21 Time HH Agency Contacted: 1500 Representative spoke with at [facility] Agency: [place], CM at [facility]   Social Determinants of Health (SDOH) Interventions    Readmission Risk Interventions Readmission Risk Prevention Plan 06/23/2021  Post Dischage Appt Complete  Medication Screening Complete  Transportation Screening Complete  Some recent data might be hidden

## 2021-06-24 NOTE — Progress Notes (Signed)
ANTICOAGULATION CONSULT NOTE - Follow Up Consult  Pharmacy Consult for Lovenox Indication: VTE prophylaxis  Allergies  Allergen Reactions   Aspirin Other (See Comments)    Upset stomach   Lactose Intolerance (Gi) Nausea And Vomiting   Lisinopril     Causes cough    Patient Measurements: Height: 5\' 11"  (180.3 cm) Weight: (!) 270 kg (595 lb 3.9 oz) IBW/kg (Calculated) : 75.3  Vital Signs: Temp: 98.4 F (36.9 C) (10/06 0723) Temp Source: Oral (10/06 0723) BP: 97/70 (10/06 0723) Pulse Rate: 90 (10/06 0723)  Labs: Recent Labs    06/23/21 0226 06/24/21 0202  HGB 12.5*  --   HCT 42.8  --   PLT 234  --   HEPRLOWMOCWT  --  0.26  CREATININE 0.97  --     Estimated Creatinine Clearance: 199.6 mL/min (by C-G formula based on SCr of 0.97 mg/dL).   Assessment: Anticoag: enox ppx based on adj body wt. CBC stable.Scr <1 - 10/6: LMWH 4hr level 0.26. (goal 0.2-0.4)  Goal of Therapy:  LMWH 0.2-0.4 Monitor platelets by anticoagulation protocol: Yes   Plan:  Con't Lovenox at 100mg /24h for VTE prophx.  Michael Golonka S. , PharmD, BCPS Clinical Staff Pharmacist [email or web address]  10-27-1986, Michael Payne 06/24/2021,9:14 AM

## 2021-06-24 NOTE — Progress Notes (Signed)
[facility] PROGRESS NOTE  Michael Payne WUJ:[id number] DOB: 1971-08-28 DOA: 05/31/2021 PCP: Michael Ghent, MD  Status Remains inpatient appropriate because:Unsafe d/c plan  Dispo: The patient is from: Home              Anticipated d/c is to: SNF              Patient currently is medically stable to d/c.   Difficult to place patient Yes                Barriers to discharge: Morbid obesity/requirement for nocturnal BiPAP  Level of care: Med-Surg   Code Status: Partial: No CPR and no defibrillation or cardioversion Family Communication:  DVT prophylaxis: Lovenox COVID vaccination status: Unknown   HPI: 50 year old with morbid obesity, obstructive sleep apnea not using CPAP at night, hypertension, ambulatory dysfunction admitted to the hospital on 9/12 with shortness of breath.  Presentation blood gas with pH 7.23, PCO2 98.  Initially treated with BiPAP, did not improve so intubated and admitted to ICU.  Extubated on 9/19 after extensive diuresis.   Clinically stabilizing.  Needs to wear CPAP or BiPAP at night that he is now more compliant. On oxygen at about 4 L/min that he was not using before coming to the hospital. Could not go home because he could not have enough support at him home. 9/30, after much discussion, he started wearing BiPAP and now consistently wearing at night.   Patient has been bedbound for the last 2 years and was living with family.  He tells me that home oxygen supplier told the family that the concentrator will not be supported by their electrical wiring system. Now his family is not able to provide him care.  Subjective: Patient alert and awake.  Excited about going home.  We discussed the importance of him continuing aggressive self-motivated therapy such as arm and leg strengthening while still in the bed.  He also verbalized understanding that weight loss is imperative to him improving and being able to ambulate again.  Discussed recent elevation  in liver enzymes and explained to him several medications that were changed and or discontinued.  He stated in regards to He rarely has an attack and usually takes them as needed.  He was unaware that he was on a pretty high dose at 300 mg BID and agreed a 100 mg dose more appropriate.  He is aware that he will need to follow-up with his doctor regarding lab work.  I told him he likely has underlying fatty liver disease due to obesity.  Objective: Vitals:   06/23/21 1938 06/24/21 0723  BP: 112/79 97/70  Pulse: 91 90  Resp: 16 17  Temp: 99 F (37.2 C) 98.4 F (36.9 C)  SpO2: 95% 95%   No intake or output data in the 24 hours ending 06/24/21 0844  Filed Weights   06/18/21 1500  Weight: (!) 270 kg    Exam:  Constitutional: NAD, calm, comfortable Respiratory: Anterior lung sounds are clear but diminished in the bases.  Currently on room air.  Compliant with nocturnal BiPAP Cardiovascular: S1-S2, normotensive, regular pulse Abdomen: Obese without tenderness, no masses palpated-Bowel sounds positive. LBM 10/5 Neurologic: CN 2-12 grossly intact. Sensation intact, DTR normal. Strength 3-4/5 x all 4 extremities.  Psychiatric: Normal judgment and insight. Alert and oriented x 3. Normal mood.    Assessment/Plan: Acute problems: Acute hypoxemic and hypercapnic respiratory failure due to decompensated diastolic heart failure.  Obesity hypoventilation, obesity class III.  Initially required ICU level of care that included aggressive diuresis with subsequent liberation  from invasive mechanical ventilation.  He is -27 L admission Compliant with nocturnal BiPAP for sleep apnea (5 L O2 bleed in, IPAP 16, EPAP 6) Discussed w/ HH RT-trying to obtain TRILOGY DEVICE instead of BiPAP Continue furosemide 40 mg daily and carvedilol twice daily.  Nonobstructive transaminitis LFTs have increased recently.  AST 43 >> 86 and ALT 53 >> 165 As a precaution I have discontinued his scheduled Tylenol at HS  decrease frequency of prn Tylenol to every 6 hours Tizanidine can cause hepatotoxicity and he has not been using this here so I have discontinued this medication Allopurinol can also cause hepatotoxicity.  I have decreased his dose from 300 mg BID 100 mg daily Will need to have follow-up c-Met or LFTs after discharge   Hypertension.   Continue amlodipine.   Pre-type 2 diabetes mellitus.   Hemoglobin A1c 5.9. Initiated on metformin this admission but discontinued due to abdominal discomfort Continue dulaglutide.    Right posterior thigh pressure ulcer stage II, POA Continue local wound care.   Acute kidney injury, metabolic alkalosis.   Resolved after aggressive diuresis and improved renal flow Last creatinine on 9/27 was normal at 0.89 with a BUN of 33-[name] in a.m. 10/5   Obesity class III. Obstructive sleep apnea, obesity hypoventilation syndrome   Patient has been bedbound due to severe obesity and hip osteoarthritis. Patient has been seen by the [place] [facility] b[facility].  He will follow-up after discharge from the hospital  Continue BiPAP as above    Data Reviewed: BNP (last 3 results) Recent Labs    05/31/21 1544  BNP 231.8*    ProBNP (last 3 results) Recent Labs    03/09/21 1232  PROBNP 23.0     Scheduled Meds:  (feeding supplement) PROSource Plus  30 mL Oral TID BM   [START ON 06/25/2021] allopurinol  100 mg Oral Daily   carvedilol  3.125 mg Oral BID WC   enoxaparin (LOVENOX) injection  100 mg Subcutaneous Q24H   furosemide  40 mg Oral Daily   multivitamin with minerals  1 tablet Oral Daily   potassium chloride  10 mEq Oral Daily   sodium chloride flush  3 mL Intravenous Q12H     Principal Problem:   Acute diastolic CHF (congestive heart failure) ([place]) Active Problems:   HTN (hypertension)   Severe obstructive sleep apnea   Bedbound   Hypoxia   Respiratory failure (HCC)   Pressure injury of skin   Pre-diabetes   Obesity, Class III, BMI  40-49.9 (morbid obesity) ([place])   Osteoarthritis   Consultants: PCCM  Procedures: Echocardiogram  Antibiotics: Azithromycin 9/14 through 9/18 Ceftriaxone 9/14 through 9/18   Time spent: 5 minutes    Michael Payne ANP  [facility] 7 am - 330 pm/M-F for direct patient care and secure chat Please refer to Amion for contact info 24  days

## 2021-06-24 NOTE — Plan of Care (Addendum)
PT did not see patient, patient wants to try and stand. Patient is very motivated to get out of bed.   Problem: Education: Goal: Knowledge of General Education information will improve Description: Including pain rating scale, medication(s)/side effects and non-pharmacologic comfort measures Outcome: Progressing   Problem: Activity: Goal: Risk for activity intolerance will decrease Outcome: Progressing   Problem: Pain Managment: Goal: General experience of comfort will improve Outcome: Progressing   Problem: Safety: Goal: Ability to remain free from injury will improve Outcome: Progressing   Problem: Skin Integrity: Goal: Risk for impaired skin integrity will decrease Outcome: Progressing

## 2021-06-25 DIAGNOSIS — R32 Unspecified urinary incontinence: Secondary | ICD-10-CM | POA: Diagnosis not present

## 2021-06-25 DIAGNOSIS — I5031 Acute diastolic (congestive) heart failure: Secondary | ICD-10-CM | POA: Diagnosis not present

## 2021-06-25 NOTE — Plan of Care (Signed)

## 2021-06-25 NOTE — Progress Notes (Signed)
Physical Therapy Treatment Patient Details Name: Michael Payne MRN: [id number] DOB: 09/06/1971 Today's Date: 06/25/2021   History of Present Illness Pt is a 50 y.o. male admitted 05/31/21 with SOB, cough, orthopnea. Workup for acute on chronic hypoxemic and hypercarbic respiratory failure. ETT 9/13-9/19. PMH includes morbid obesity, DM2, OSA (non compliant CPAP), HTN, chronic narcotic use for LE pain, bedbound x 2 years.   PT Comments    Pt extremely motivated for OOB this session to bariatric chair this session via maxisky lift; unfortunately lift equipment not working Programmer, multimedia contacted). Trialled bed mobility and attempts to sit EOB; pt ultimately unable to sit EOB despite maxA+3 (of note, sizewize bariatric beds are arduous to sit EOB on for many patients). Despite this, pt remains in good spirits and optimistic for d/c home on Monday; pt states, "I'm fighting for my kids... I had given up, but after all of this, I'm motivated to do the work to get well." If to remain admitted, will continue to follow acutely.   Recommendations for follow up therapy are one component of a multi-disciplinary discharge planning process, led by the attending physician.  Recommendations may be updated based on patient status, additional functional criteria and insurance authorization.  Follow Up Recommendations  Supervision for mobility/OOB     Equipment Recommendations  None recommended by PT    Recommendations for Other Services       Precautions / Restrictions Precautions Precautions: Fall;Other (comment) Precaution Comments: Has been bedbound for ~2 years; chronic R hip pain     Mobility  Bed Mobility Overal bed mobility: Needs Assistance Bed Mobility: Supine to Sit     Supine to sit: Max assist;+2 for safety/equipment;HOB elevated (+3 assist)     General bed mobility comments: Maxisky lift not working, determined maxislide transfer from bed to bariatric chair not  safe to try, therefore trialled sitting EOB; pt with good ability to scoot BLEs and hips to EOB with mod-maxA+2, attempted 3x trial to come to sitting with BUE support and trunk support with maxA+3, pt ultimately unable to come to sitting, suspect mainly affected by positioning of bariatric bed railing and mattress (pt unable to tolerate mattress deflate secondary to hip pain)    Transfers                    Ambulation/Gait                 Stairs             Wheelchair Mobility    Modified Rankin (Stroke Patients Only)       Balance Overall balance assessment: Needs assistance   Sitting balance-Leahy Scale: Zero Sitting balance - Comments: unable to achieve fully upright sitting EOB this session despite maxA+3                                    Cognition Arousal/Alertness: Awake/alert Behavior During Therapy: WFL for tasks assessed/performed Overall Cognitive Status: Within Functional Limits for tasks assessed                                 General Comments: Very pleasant and motivated      Exercises      General Comments General comments (skin integrity, edema, etc.): SpO2 96% on RA. Mainenance/facilities management contacted about non-operative maxisky lift in pt's room  Pertinent Vitals/Pain Pain Assessment: Faces Faces Pain Scale: Hurts little more Pain Location: R hip Pain Descriptors / Indicators: Grimacing;Guarding Pain Intervention(s): Monitored during session;Limited activity within patient's tolerance    Home Living                      Prior Function            PT Goals (current goals can now be found in the care plan section) Acute Rehab PT Goals Patient Stated Goal: return home PT Goal Formulation: With patient Time For Goal Achievement: 07/09/21 Potential to Achieve Goals: Fair Progress towards PT goals: Progressing toward goals    Frequency    Min 2X/week      PT Plan  Current plan remains appropriate    Co-evaluation              AM-PAC PT "6 Clicks" Mobility   Outcome Measure  Help needed turning from your back to your side while in a flat bed without using bedrails?: A Lot Help needed moving from lying on your back to sitting on the side of a flat bed without using bedrails?: Total Help needed moving to and from a bed to a chair (including a wheelchair)?: Total Help needed standing up from a chair using your arms (e.g., wheelchair or bedside chair)?: Total Help needed to walk in hospital room?: Total Help needed climbing 3-5 steps with a railing? : Total 6 Click Score: 7    End of Session   Activity Tolerance: Patient tolerated treatment well Patient left: in bed;with call bell/phone within reach Nurse Communication: Mobility status PT Visit Diagnosis: Other abnormalities of gait and mobility (R26.89)     Time: 1405-1440 PT Time Calculation (min) (ACUTE ONLY): 35 min  Charges:  $Therapeutic Activity: 23-37 mins                     Ina Homes, PT, DPT Acute Rehabilitation Services  Pager [phone] Office [phone]  Malachy Chamber 06/25/2021, 3:01 PM

## 2021-06-25 NOTE — Progress Notes (Addendum)
[facility] PROGRESS NOTE  ARVLE GRABE ZSW:[id number] DOB: [birth year] DOA: 05/31/2021 PCP: [name], MD  Status Remains inpatient appropriate because:Unsafe d/c plan  Dispo: The patient is from: Home              Anticipated d/c is to: SNF              Patient currently is medically stable to d/c.     Difficult to place patient Yes                Barriers to discharge: Morbid obesity/requirement for nocturnal BiPAP  Level of care: Med-Surg   Code Status: Partial: No CPR and no defibrillation or cardioversion Family Communication: Patient on 10/6; TOC spoke with wife as well on 10/6 DVT prophylaxis: Lovenox COVID vaccination status: Unknown   HPI: 50 year old with morbid obesity, obstructive sleep apnea not using CPAP at night, hypertension, ambulatory dysfunction admitted to the hospital on 9/12 with shortness of breath.  Presentation blood gas with pH 7.23, PCO2 98.  Initially treated with BiPAP, did not improve so intubated and admitted to ICU.  Extubated on 9/19 after extensive diuresis.   Clinically stabilizing.  Needs to wear CPAP or BiPAP at night that he is now more compliant. On oxygen at about 4 L/min that he was not using before coming to the hospital. Could not go home because he could not have enough support at him home. 9/30, after much discussion, he started wearing BiPAP and now consistently wearing at night.   Patient has been bedbound for the last 2 years and was living with family.  He tells me that home oxygen supplier told the family that the concentrator will not be supported by their electrical wiring system. Now his family is not able to provide him care.  Subjective: Alert laying supine in bed.  Tech giving patient full body bath.  No complaints or request verbalized.  Eager to discharge home Monday.  Objective: Vitals:   06/24/21 1537 06/24/21 2100  BP: 105/69 96/70  Pulse: 84 86  Resp: 16 16  Temp: 98.4 F (36.9 C) 98.7 F (37.1 C)   SpO2: 94% 94%     Filed Weights   06/18/21 1500  Weight: (!) 270 kg    Exam:  Constitutional: NAD, calm, comfortable Respiratory: Lung sounds are clear to auscultation and diminished in the bases.  Stable on room air with pulse oximetry 94 to 96%.  Compliant with nocturnal BiPAP Cardiovascular: S1-S2, normotensive, regular pulse, no obvious JVD and no peripheral edema on exam Abdomen: Obese without tenderness, no masses palpated-Bowel sounds positive. LBM 10/5 Neurologic: CN 2-12 grossly intact. Sensation intact, DTR normal. Strength 3-4/5 x all 4 extremities.  Psychiatric: Normal judgment and insight. Alert and oriented x 3.  Pleasant mood   Assessment/Plan: Acute problems: Acute hypoxemic and hypercapnic respiratory failure due to decompensated diastolic heart failure.  Obesity hypoventilation, obesity class III. Initially required ICU level of care -s/p aggressive diuresis.  He is -27 L  Compliant with nocturnal BiPAP for sleep apnea (5 L O2 bleed in, IPAP 16, EPAP 6) Discussed w/ HH RT-trying to obtain TRILOGY DEVICE instead of BiPAP Continue furosemide 40 mg daily and carvedilol twice daily.  Nonobstructive transaminitis LFTs have increased recently.  AST 43 >> 86 and ALT 53 >> 165 As a precaution I have discontinued his scheduled Tylenol at HS decrease frequency of prn Tylenol to every 6 hours Tizanidine can cause hepatotoxicity and he has not been using this  here so I have discontinued this medication Allopurinol can also cause hepatotoxicity.  I have decreased his dose from 300 mg BID  to 100 mg daily Will need to have follow-up c-Met or LFTs after discharge   Hypertension.   Continue amlodipine.   Pre-type 2 diabetes mellitus.   Hemoglobin A1c 5.9. Initiated on metformin this admission but discontinued due to abdominal discomfort Continue dulaglutide.    Right posterior thigh pressure ulcer stage II, POA Continue local wound care.   Acute kidney injury,  metabolic alkalosis.   Resolved after aggressive diuresis and improved renal flow Last creatinine on 9/27 was normal at 0.89 with a BUN of 33-10/5 BUN 21 and creatinine 0.97   Obesity class III. Obstructive sleep apnea, obesity hypoventilation syndrome   Patient has been bedbound due to severe obesity and hip osteoarthritis. Patient has been seen by the [place] [facility] bariat[facility] program.  He will follow-up after discharge from the hospital  Continue BiPAP as above    Data Reviewed: BNP (last 3 results) Recent Labs    05/31/21 1544  BNP 231.8*    ProBNP (last 3 results) Recent Labs    03/09/21 1232  PROBNP 23.0     Scheduled Meds:  (feeding supplement) PROSource Plus  30 mL Oral TID BM   allopurinol  100 mg Oral Daily   carvedilol  3.125 mg Oral BID WC   enoxaparin (LOVENOX) injection  100 mg Subcutaneous Q24H   furosemide  40 mg Oral Daily   multivitamin with minerals  1 tablet Oral Daily   potassium chloride  10 mEq Oral Daily   sodium chloride flush  3 mL Intravenous Q12H     Principal Problem:   Acute diastolic CHF (congestive heart failure) ([place]) Active Problems:   HTN (hypertension)   Severe obstructive sleep apnea   Bedbound   Hypoxia   Respiratory failure (HCC)   Pressure injury of skin   Pre-diabetes   Obesity, Class III, BMI 40-49.9 (morbid obesity) ([place])   Osteoarthritis   Consultants: PCCM  Procedures: Echocardiogram  Antibiotics: Azithromycin 9/14 through 9/18 Ceftriaxone 9/14 through 9/18   Time spent: 5 minutes    Erin Hearing ANP  [facility] 7 am - 330 pm/M-F for direct patient care and secure chat Please refer to Amion for contact info 25  days

## 2021-06-26 DIAGNOSIS — I5031 Acute diastolic (congestive) heart failure: Secondary | ICD-10-CM | POA: Diagnosis not present

## 2021-06-26 NOTE — Progress Notes (Signed)
[facility] PROGRESS NOTE  Michael TAMBURRINO [id number]:[id number] DOB: August 29, 1971 DOA: 05/31/2021 PCP: [name], MD  Status Remains inpatient appropriate because:Unsafe d/c plan  Dispo: The patient is from: Home              Anticipated d/c is to: SNF              Patient currently is medically stable to d/c.     Difficult to place patient Yes                Barriers to discharge: Morbid obesity/requirement for nocturnal BiPAP  Level of care: Med-Surg   Code Status: Partial: No CPR and no defibrillation or cardioversion Family Communication: Patient on 10/6; TOC spoke with wife as well on 10/6 DVT prophylaxis: Lovenox COVID vaccination status: Unknown   HPI: 50 year old with morbid obesity, obstructive sleep apnea not using CPAP at night, hypertension, ambulatory dysfunction admitted to the hospital on 9/12 with shortness of breath.  Presentation blood gas with pH 7.23, PCO2 98.  Initially treated with BiPAP, did not improve so intubated and admitted to ICU.  Extubated on 9/19 after extensive diuresis.   Clinically stabilizing.  Needs to wear CPAP or BiPAP at night that he is now more compliant. On oxygen at about 4 L/min that he was not using before coming to the hospital. Could not go home because he could not have enough support at him home. 9/30, after much discussion, he started wearing BiPAP and now consistently wearing at night.   Patient has been bedbound for the last 2 years and was living with family.  He tells me that home oxygen supplier told the family that the concentrator will not be supported by their electrical wiring system. Now his family is not able to provide him care.  Subjective: Patient was seen and examined at his bedside.  There were no acute events overnight.  No new complaints.  His NIV trilogy was in the room.  Working on safe disposition to home.  Objective: Vitals:   06/26/21 0750 06/26/21 1405  BP: 122/68 130/70  Pulse: 87 91  Resp: 16 15   Temp: 98.4 F (36.9 C) 97.9 F (36.6 C)  SpO2: 94% 96%     Filed Weights   06/18/21 1500  Weight: (!) 270 kg    Exam:  Constitutional: NAD, calm, comfortable Respiratory: Lung sounds are clear to auscultation and diminished in the bases.  Stable on room air with pulse oximetry 94 to 96%.  Compliant with nocturnal BiPAP Cardiovascular: S1-S2, normotensive, regular pulse, no obvious JVD and no peripheral edema on exam Abdomen: Obese without tenderness, no masses palpated-Bowel sounds positive. LBM 10/5 Neurologic: CN 2-12 grossly intact. Sensation intact, DTR normal. Strength 3-4/5 x all 4 extremities.  Psychiatric: Normal judgment and insight. Alert and oriented x 3.  Pleasant mood   Assessment/Plan: Acute problems: Acute hypoxemic and hypercapnic respiratory failure due to decompensated diastolic heart failure.  Obesity hypoventilation, obesity class III. Initially required ICU level of care -s/p aggressive diuresis.  He is -27 L  Compliant with nocturnal BiPAP for sleep apnea (5 L O2 bleed in, IPAP 16, EPAP 6) Discussed w/ HH RT-trying to obtain TRILOGY DEVICE instead of BiPAP Continue furosemide 40 mg daily and carvedilol twice daily.  Nonobstructive transaminitis LFTs have increased recently.  AST 43 >> 86 and ALT 53 >> 165 As a precaution I have discontinued his scheduled Tylenol at HS decrease frequency of prn Tylenol to every 6 hours Tizanidine  can cause hepatotoxicity and he has not been using this here so I have discontinued this medication Allopurinol can also cause hepatotoxicity.  I have decreased his dose from 300 mg BID  to 100 mg daily Will need to have follow-up c-Met or LFTs after discharge   Hypertension.   Continue amlodipine.   Pre-type 2 diabetes mellitus.   Hemoglobin A1c 5.9. Initiated on metformin this admission but discontinued due to abdominal discomfort Continue dulaglutide.    Right posterior thigh pressure ulcer stage II, POA Continue local  wound care.   Acute kidney injury, metabolic alkalosis.   Resolved after aggressive diuresis and improved renal flow Last creatinine on 9/27 was normal at 0.89 with a BUN of 33-10/5 BUN 21 and creatinine 0.97   Obesity class III. Obstructive sleep apnea, obesity hypoventilation syndrome   Patient has been bedbound due to severe obesity and hip osteoarthritis. Patient has been seen by the [place] [facility].  He will follow-up after discharge from the hospital  Continue BiPAP as above    Data Reviewed: BNP (last 3 results) Recent Labs    05/31/21 1544  BNP 231.8*    ProBNP (last 3 results) Recent Labs    03/09/21 1232  PROBNP 23.0     Scheduled Meds:  (feeding supplement) PROSource Plus  30 mL Oral TID BM   allopurinol  100 mg Oral Daily   carvedilol  3.125 mg Oral BID WC   enoxaparin (LOVENOX) injection  100 mg Subcutaneous Q24H   furosemide  40 mg Oral Daily   multivitamin with minerals  1 tablet Oral Daily   potassium chloride  10 mEq Oral Daily   sodium chloride flush  3 mL Intravenous Q12H     Principal Problem:   Acute diastolic CHF (congestive heart failure) (HCC) Active Problems:   HTN (hypertension)   Severe obstructive sleep apnea   Bedbound   Hypoxia   Respiratory failure (HCC)   Pressure injury of skin   Pre-diabetes   Obesity, Class III, BMI 40-49.9 (morbid obesity) ([place])   Osteoarthritis   Consultants: PCCM  Procedures: Echocardiogram  Antibiotics: Azithromycin 9/14 through 9/18 Ceftriaxone 9/14 through 9/18   Time spent: 5 minutes    Kayleen Memos ANP  [facility] 7 am - 330 pm/M-F for direct patient care and secure chat Please refer to Amion for contact info 26  days

## 2021-06-26 NOTE — Progress Notes (Signed)
Pt placed self on home bipap.

## 2021-06-27 DIAGNOSIS — I5031 Acute diastolic (congestive) heart failure: Secondary | ICD-10-CM | POA: Diagnosis not present

## 2021-06-27 MED ORDER — DICLOFENAC SODIUM 1 % EX GEL
4.0000 g | Freq: Four times a day (QID) | CUTANEOUS | Status: DC
  Administered 2021-06-27 (×2): 4 g via TOPICAL
  Filled 2021-06-27: qty 100

## 2021-06-27 MED ORDER — LOPERAMIDE HCL 2 MG PO CAPS
4.0000 mg | ORAL_CAPSULE | ORAL | Status: DC | PRN
  Administered 2021-06-27 – 2021-06-28 (×2): 4 mg via ORAL
  Filled 2021-06-27 (×2): qty 2

## 2021-06-27 NOTE — Plan of Care (Signed)

## 2021-06-27 NOTE — Plan of Care (Signed)
  Problem: Education: Goal: Knowledge of General Education information will improve Description Including pain rating scale, medication(s)/side effects and non-pharmacologic comfort measures Outcome: Progressing   

## 2021-06-27 NOTE — Progress Notes (Signed)
Patient on home Trilogy.  RT assistance not needed at this time.

## 2021-06-27 NOTE — Progress Notes (Signed)
[facility] PROGRESS NOTE  Michael Payne ASN:[id number] DOB: November 24, 1970 DOA: 05/31/2021 PCP: [name], MD  Status Remains inpatient appropriate because:Unsafe d/c plan  Dispo: The patient is from: Home              Anticipated d/c is to: SNF              Patient currently is medically stable to d/c.     Difficult to place patient Yes                Barriers to discharge: Morbid obesity/requirement for nocturnal BiPAP  Level of care: Med-Surg   Code Status: Partial: No CPR and no defibrillation or cardioversion Family Communication: Patient on 10/6; TOC spoke with wife as well on 10/6 DVT prophylaxis: Lovenox COVID vaccination status: Unknown   HPI: 50 year old with morbid obesity, obstructive sleep apnea not using CPAP at night, hypertension, ambulatory dysfunction admitted to the hospital on 9/12 with shortness of breath.  Presentation blood gas with pH 7.23, PCO2 98.  Initially treated with BiPAP, did not improve so intubated and admitted to ICU.  Extubated on 9/19 after extensive diuresis.   Clinically stabilizing.  Needs to wear CPAP or BiPAP at night that he is now more compliant. On oxygen at about 4 L/min that he was not using before coming to the hospital. Could not go home because he could not have enough support at him home. 9/30, after much discussion, he started wearing BiPAP and now consistently wearing at night.   Patient has been bedbound for the last 2 years and was living with family.  He tells me that home oxygen supplier told the family that the concentrator will not be supported by their electrical wiring system. Now his family is not able to provide him care.  Subjective: Patient was seen and examined at his bedside.  He reports having loose stools since last night and continues this AM.  States it happens sometimes at home.  He is lactose intolerant.  When he takes imodium at home, it usually resolves.  Objective: Vitals:   06/27/21 0809  06/27/21 1441  BP: 116/77 111/69  Pulse: 93 92  Resp: 16 16  Temp: 97.9 F (36.6 C) 97.9 F (36.6 C)  SpO2: 92% 91%     Filed Weights   06/18/21 1500  Weight: (!) 270 kg    Exam:  Constitutional: Severely morbidly obese in no acute distress.  He is alert and oriented x3. Respiratory: Clear to auscultation with no wheezes or rales.  Good inspiration effort.   Cardiovascular: Regular rate and rhythm no rubs or gallops.  No JVD autonomically noted. Abdomen: Morbidly obese nontender normal bowel sounds present.   Musculoskeletal: Trace lower extremity edema bilaterally. Neurologic: Awake and alert. Psychiatric: Mood is appropriate for condition and setting.  Assessment/Plan: Acute problems: Acute hypoxemic and hypercapnic respiratory failure due to decompensated diastolic heart failure.  Obesity hypoventilation, obesity class III. Initially required ICU level of care -s/p aggressive diuresis.  He is -27 L  Compliant with nocturnal BiPAP for sleep apnea (5 L O2 bleed in, IPAP 16, EPAP 6) NIV trilogy at bedside.  He has been compliant.  Nonobstructive transaminitis LFTs have increased recently.  AST 43 >> 86 and ALT 53 >> 165 As a precaution I have discontinued his scheduled Tylenol at HS decrease frequency of prn Tylenol to every 6 hours Tizanidine can cause hepatotoxicity and he has not been using this here so I have discontinued this medication  Allopurinol can also cause hepatotoxicity.  I have decreased his dose from 300 mg BID  to 100 mg daily Will need to have follow-up c-Met or LFTs after discharge  Loose stools, suspect secondary to electrolyte imbalance Imodium as needed Good oral intake, no evidence of dehydration on physical exam.  Left knee pain, atraumatic Voltaren gel 4 times daily x3 days.   Hypertension.   Continue amlodipine.   Pre-type 2 diabetes mellitus.   Hemoglobin A1c 5.9. Initiated on metformin this admission but discontinued due to abdominal  discomfort Continue dulaglutide.    Right posterior thigh pressure ulcer stage II, POA Continue local wound care.   Acute kidney injury, metabolic alkalosis.   Resolved after aggressive diuresis and improved renal flow Last creatinine on 9/27 was normal at 0.89 with a BUN of 33-10/5 BUN 21 and creatinine 0.97   Obesity class III. Obstructive sleep apnea, obesity hypoventilation syndrome   Patient has been bedbound due to severe obesity and hip osteoarthrosis for 2 years, mainly stays in his bedroom.  Former Psychologist, educational. Patient has been seen by the [place] [facility].  He will follow-up after discharge from the hospital  Continue BiPAP as above    Data Reviewed: BNP (last 3 results) Recent Labs    05/31/21 1544  BNP 231.8*    ProBNP (last 3 results) Recent Labs    03/09/21 1232  PROBNP 23.0     Scheduled Meds:  (feeding supplement) PROSource Plus  30 mL Oral TID BM   allopurinol  100 mg Oral Daily   carvedilol  3.125 mg Oral BID WC   enoxaparin (LOVENOX) injection  100 mg Subcutaneous Q24H   furosemide  40 mg Oral Daily   multivitamin with minerals  1 tablet Oral Daily   potassium chloride  10 mEq Oral Daily   sodium chloride flush  3 mL Intravenous Q12H     Principal Problem:   Acute diastolic CHF (congestive heart failure) (HCC) Active Problems:   HTN (hypertension)   Severe obstructive sleep apnea   Bedbound   Hypoxia   Respiratory failure (HCC)   Pressure injury of skin   Pre-diabetes   Obesity, Class III, BMI 40-49.9 (morbid obesity) ([place])   Osteoarthritis   Consultants: PCCM  Procedures: Echocardiogram  Antibiotics: Azithromycin 9/14 through 9/18 Ceftriaxone 9/14 through 9/18   Time spent: 5 minutes    [facility] 7 am - 330 pm/M-F for direct patient care and secure chat Please refer to Amion for contact info 27  days

## 2021-06-28 DIAGNOSIS — L89202 Pressure ulcer of unspecified hip, stage 2: Secondary | ICD-10-CM | POA: Clinically undetermined

## 2021-06-28 DIAGNOSIS — I5031 Acute diastolic (congestive) heart failure: Secondary | ICD-10-CM | POA: Diagnosis not present

## 2021-06-28 DIAGNOSIS — N179 Acute kidney failure, unspecified: Secondary | ICD-10-CM | POA: Diagnosis present

## 2021-06-28 DIAGNOSIS — R7401 Elevation of levels of liver transaminase levels: Secondary | ICD-10-CM

## 2021-06-28 MED ORDER — DICLOFENAC SODIUM 1 % EX GEL: 4.0000 g | Freq: Four times a day (QID) | CUTANEOUS | 3 refills | Status: DC

## 2021-06-28 NOTE — Discharge Summary (Signed)
Physician Discharge Summary  [name] MEB:[id number] DOB: 03/07/1971 DOA: 05/31/2021  PCP: [name], MD  Admit date: 05/31/2021 Discharge date: 06/28/2021  Time spent: 35 minutes  Recommendations for Outpatient Follow-up:  Patient has been instructed to call his primary care physician Dr. Damita Dunnings to schedule follow-up appointment after discharge Patient also needs to follow-up with pulmonary medicine regarding additional evaluation for sleep apnea symptom.  Ambulatory referral has been sent Patient will be receiving home health services [facility].  The services include home health RN, physical therapy, nursing aide, and social work Patient will be receiving his oxygen supplies including his trilogy machine from [employer]. This patient will require follow-up LFTs 1 to 3 weeks after discharge.  It is suspected that transaminitis is likely related to underlying fatty liver disease.  During the hospitalization he was on Tylenol at [facility] which has been subsequently discontinued.  The frequency of his Tylenol has also been decreased to every 6 hours prn.  Patient was prescribed tizanidine prior to admission and during the hospitalization.  He did not use this medication which can also increased LFTs so this was also discontinued.  Allopurinol can also cause hepatotoxicity so his dosage was decreased from 300 mg BID to 100 mg daily.  Code status: Patient is full code except for no CPR, no defibrillation or cardioversion  Discharge Diagnoses:  Principal Problem:   Acute diastolic CHF (congestive heart failure) ([place]) Active Problems:   HTN (hypertension)   Severe obstructive sleep apnea   Bedbound   Hypoxia   Respiratory failure (HCC)   Pressure injury of skin   Pre-diabetes   Obesity, Class III, BMI 40-49.9 (morbid obesity) (HCC)   Osteoarthritis   Nonobstructive transaminitis   Decubitus ulcer of posterior thigh, stage 2 (HCC)   Acute kidney injury  (resolved)    Discharge Condition: Stable  Diet recommendation: Heart healthy, carbohydrate modified  Filed Weights   06/18/21 1500  Weight: (!) 270 kg    History of present illness:  50 year old with morbid obesity, obstructive sleep apnea not using CPAP at night, hypertension, ambulatory dysfunction admitted to the hospital on 9/12 with shortness of breath.  Presentation blood gas with pH 7.23, PCO2 98.  Initially treated with BiPAP, did not improve so intubated and admitted to ICU.  Extubated on 9/19 after extensive diuresis.   Clinically stabilizing.  Needs to wear CPAP or BiPAP at night that he is now more compliant. On oxygen at about 4 L/min that he was not using before coming to the hospital. Could not go home because he could not have enough support at him home. 9/30, after much discussion, he started wearing BiPAP and now consistently wearing at night.  Due to patient's morbid obesity we were unable to find a skilled nursing facility bed that could accommodate him.  Patient's preference was to always return home.  After TOC work diligently to obtain additional home care services for the patient wife and patient were agreeable to return to the home environment.  Home health has provided his home trilogy machine which he has learned to utilize prior to discharge and has tolerated well.  Hospital Course:  Acute hypoxemic and hypercapnic respiratory failure due to decompensated diastolic heart failure.  Obesity hypoventilation, obesity class III. Initially required ICU level of care -s/p aggressive diuresis.  He is -27 L  Compliant with nocturnal BiPAP for sleep apnea (5 L O2 bleed in, IPAP 16, EPAP 6) Discussed w/ HH RT-trying to  obtain TRILOGY DEVICE instead of BiPAP Continue furosemide 40 mg daily and carvedilol twice daily.   Nonobstructive transaminitis LFTs have increased recently.  AST 43 >> 86 and ALT 53 >> 165 As a precaution I have discontinued his scheduled Tylenol at  HS decrease frequency of prn Tylenol to every 6 hours Tizanidine can cause hepatotoxicity and he has not been using this here so I have discontinued this medication Allopurinol can also cause hepatotoxicity.  I have decreased his dose from 300 mg BID  to 100 mg daily Will need to have follow-up c-Met or LFTs after discharge   Hypertension.   Continue amlodipine.   Type 2 diabetes mellitus.   Hemoglobin A1c 5.9. Initiated on metformin this admission but discontinued due to abdominal discomfort Continue dulaglutide.    Right posterior thigh pressure ulcer stage II, POA Continue local wound care.   Acute kidney injury, metabolic alkalosis.   Resolved after aggressive diuresis and improved renal flow Last creatinine on 9/27 was normal at 0.89 with a BUN of 33-10/5 BUN 21 and creatinine 0.97   Obesity class III. Obstructive sleep apnea, obesity hypoventilation syndrome   Patient has been bedbound due to severe obesity and hip osteoarthritis. Patient has been seen by [facility] [place] [facility].  He will follow-up after discharge from the hospital  Continue BiPAP as above    Procedures: Echocardiogram  Consultations: PCCM  Antibiotics: Azithromycin 9/14 through 9/18 Ceftriaxone 9/14 through 9/18  Discharge Exam: Vitals:   06/27/21 2000 06/28/21 0815  BP: 108/66 (!) 145/80  Pulse: 92 80  Resp: 18 19  Temp: 98.3 F (36.8 C) 98.2 F (36.8 C)  SpO2: 95% 97%     Discharge Instructions   Discharge Instructions     (HEART FAILURE PATIENTS) Call MD:  Anytime you have any of the following symptoms: 1) 3 pound weight gain in 24 hours or 5 pounds in 1 week 2) shortness of breath, with or without a dry hacking cough 3) swelling in the hands, feet or stomach 4) if you have to sleep on extra pillows at night in order to breathe.   Complete by: As directed    Ambulatory referral to Pulmonology   Complete by: As directed    Severe suspected OSA as well as obesity  hypoventilation syndrome.  Sent home on trilogy machine but has not had formal sleep study   Reason for referral: Other   Call MD for:  extreme fatigue   Complete by: As directed    Call MD for:  persistant dizziness or light-headedness   Complete by: As directed    Call MD for:  persistant nausea and vomiting   Complete by: As directed    Call MD for:  temperature >100.4   Complete by: As directed    Diet Carb Modified   Complete by: As directed    Discharge instructions   Complete by: As directed    Please make sure to schedule appointment with your primary care physician.  He will also need to follow-up with a pulmonologist regarding diagnosis of sleep apnea and obesity hypoventilation syndrome.  An ambulatory referral has been sent to [facility] pulmonology in [place] and the office should be contacting you with a date and time for appointment.  You will be receiving home health services upon discharge.  Continue to follow your blood glucose readings before meals and at bedtime.  Write these readings down and bring to all physician appointments.   Discharge wound care:   Complete  by: As directed    You have a wound on the back of your right thigh.  Currently you are not receiving any dressings but the home health nurse needs to evaluate this wound at least weekly to ensure improvement   Increase activity slowly   Complete by: As directed       Allergies as of 06/28/2021       Reactions   Aspirin Other (See Comments)   Upset stomach   Lactose Intolerance (gi) Nausea And Vomiting   Lisinopril    Causes cough        Medication List     STOP taking these medications    diclofenac 75 MG EC tablet Commonly known as: VOLTAREN   lidocaine 5 % Commonly known as: LIDODERM   tiZANidine 4 MG tablet Commonly known as: ZANAFLEX       TAKE these medications    acetaminophen 325 MG tablet Commonly known as: TYLENOL Take 2 tablets (650 mg total) by mouth every 6 (six)  hours as needed for headache or mild pain.   allopurinol 100 MG tablet Commonly known as: ZYLOPRIM Take 1 tablet (100 mg total) by mouth daily. What changed:  medication strength how much to take when to take this   amLODipine 10 MG tablet Commonly known as: NORVASC Take 1 tablet (10 mg total) by mouth daily.   carvedilol 3.125 MG tablet Commonly known as: COREG Take 1 tablet (3.125 mg total) by mouth 2 (two) times daily with a meal.   colchicine 0.6 MG tablet Commonly known as: Colcrys Take 1 tablet (0.6 mg total) by mouth daily as needed.   diclofenac Sodium 1 % Gel Commonly known as: VOLTAREN Apply 4 g topically 4 (four) times daily.   Ensure Max Protein Liqd Take 330 mLs (11 oz total) by mouth 2 (two) times daily.   furosemide 40 MG tablet Commonly known as: LASIX Take 1 tablet (40 mg total) by mouth daily.   naloxone 4 MG/0.1ML Liqd nasal spray kit Commonly known as: NARCAN Spray nostril if needed for opioid overuse- decreased consciousness, decreased respirations   oxyCODONE-acetaminophen 10-325 MG tablet Commonly known as: PERCOCET Take 1 tablet by mouth every 6 (six) hours as needed for pain.   polyethylene glycol 17 g packet Commonly known as: MIRALAX / GLYCOLAX Take 17 g by mouth daily as needed for moderate constipation.   potassium chloride 10 MEQ tablet Commonly known as: KLOR-CON Take 1 tablet (10 mEq total) by mouth daily.   Trulicity 8.46 KZ/9.9JT Sopn Generic drug: Dulaglutide INJECT 0.75 MG INTO THE SKIN ONCE A WEEK.               Durable Medical Equipment  (From admission, onward)           Start     Ordered   06/23/21 1124  For home use only DME Bipap  Once       Comments: IPAP 16   EPAP 6 Full face mask-medium  Question Answer Comment  Length of Need Lifetime   Bleed in oxygen (LPM) 5 liters-nocturnal use   Inspiratory pressure OTHER SEE COMMENTS   Expiratory pressure OTHER SEE COMMENTS      06/23/21 1124   06/23/21  1122  For home use only DME oxygen  Once       Comments: Nocturnal BiPAP: IPAP 16, EPAP 6 w/ 5L/min O2- medium full face mask  Question Answer Comment  Length of Need Lifetime   Liters per Minute 5  Frequency Only at night (stationary unit needed)   Oxygen delivery system Gas      06/23/21 1124   06/14/21 1432  For home use only DME oxygen  Once       Question Answer Comment  Length of Need 6 Months   Mode or (Route) Nasal cannula   Liters per Minute 4   Frequency Continuous (stationary and portable oxygen unit needed)   Oxygen conserving device Yes   Oxygen delivery system Gas      06/14/21 1431              Discharge Care Instructions  (From admission, onward)           Start     Ordered   06/28/21 0000  Discharge wound care:       Comments: You have a wound on the back of your right thigh.  Currently you are not receiving any dressings but the home health nurse needs to evaluate this wound at least weekly to ensure improvement   06/28/21 1030           Allergies  Allergen Reactions   Aspirin Other (See Comments)    Upset stomach   Lactose Intolerance (Gi) Nausea And Vomiting   Lisinopril     Causes cough    Follow-up Information     [name], MD. Schedule an appointment as soon as possible for a visit .   Specialty: Family Medicine Contact information: [place] [address] [place], [place] Follow up.   Specialty: Home Health Services Why: Home Health RN, Physical Therapy, Aide , Social Work will call to arrange visits Contact information: [place] [address] [phone]         [facility] Follow up.   Why: Trilogy Bipap for home - will be transported home with the patient. Contact information: [place] [address] [phone]         [facility], [place]. Call.   Specialty: Home Health Services Why: [facility] was notified that the patient is discharging home today on 06/28/2021 and will need resumption of services. Contact information: [place] [address] [phone]                  The results of significant diagnostics from this hospitalization (including imaging, microbiology, ancillary and laboratory) are listed below for reference.    Significant Diagnostic Studies: DG Chest 1 View  Result Date: 06/10/2021 CLINICAL DATA:  Dyspnea R06.00 (ICD-10-CM) EXAM: CHEST  1 VIEW COMPARISON:  06/07/2021. FINDINGS: Evaluation is limited due to patient body habitus and patient rotation. Right IJ central venous catheter with tip likely projecting in the region of the SVC when accounting for patient rotation. Endotracheal tube is no longer visualized. Similar cardiomegaly. Pulmonary vascular congestion, possibly mildly improved. Probable small left pleural effusion. Possible pulmonary edema and pulmonary vascular congestion, which may be mildly improved. No visible pneumothorax on this semi erect radiograph. Left basilar opacities. IMPRESSION: 1. Limited study due to patient body habitus and patient rotation. 2. Similar cardiomegaly, probable small left pleural effusion, and overlying left basilar opacities. 3. Possible superimposed edema and pulmonary vascular congestion, which may be mildly improved. Electronically Signed   By: Margaretha Sheffield M.D.   On: 06/10/2021 11:57   CT CHEST WO CONTRAST  Result Date: 05/31/2021 CLINICAL DATA:  Cardiomegaly, follow-up.  Hypoxia. EXAM: CT CHEST WITHOUT CONTRAST TECHNIQUE: Multidetector CT imaging of the chest was performed following the standard protocol without IV contrast. COMPARISON:  Chest x-ray 05/31/2021. FINDINGS: Cardiovascular: The heart is moderately enlarged. There is no pericardial effusion. Aortic diameter is difficult to measure secondary to lack of contrast and motion artifact, but grossly within normal limits.  Mediastinum/Nodes: No definite mediastinal lymphadenopathy. There is questionable right hilar lymphadenopathy with lymph nodes measuring up to 2.3 cm, although evaluation is limited secondary to motion artifact and lack contrast. Esophagus appears nondilated. Thyroid gland not well evaluated. Lungs/Pleura: There are multifocal patchy airspace opacities in the right upper lobe in bilateral lower lobes. Trachea and central airways are grossly patent, but not well evaluated. There is no pleural effusion or pneumothorax identified. Upper Abdomen: No acute abnormality. Musculoskeletal: There is irregularity of the left scapular wing. This may be secondary to artifact, but scapular wing fracture cannot be entirely excluded. Degenerative changes affect the thoracic spine. IMPRESSION: 1. Moderate cardiomegaly. 2. Multifocal airspace disease in the bilateral lower lobes and right upper lobe worrisome for infection. 3. Questionable right hilar lymphadenopathy.  This is indeterminate. 4. Irregularity of the left scapular wing is most likely artifactual. Please correlate clinically to exclude fracture. Electronically Signed   By: Ronney Asters M.D.   On: 05/31/2021 23:17   DG CHEST PORT 1 VIEW  Result Date: 06/07/2021 CLINICAL DATA:  Intubation EXAM: PORTABLE CHEST 1 VIEW COMPARISON:  Earlier today FINDINGS: Advanced endotracheal tube with tip between the clavicular heads and carina. The enteric tube at least reaches the diaphragm. Cardiomegaly and pulmonary edema/vascular congestion. IMPRESSION: Advanced endotracheal tube with tip between the clavicular heads and carina. Electronically Signed   By: Jorje Guild M.D.   On: 06/07/2021 06:50   DG CHEST PORT 1 VIEW  Result Date: 06/07/2021 CLINICAL DATA:  Intubation EXAM: PORTABLE CHEST 1 VIEW COMPARISON:  Three days ago FINDINGS: The endotracheal tube tip projects over the lower neck at the level of C7. An enteric tube at least reaches the diaphragm, beyond which there  is obscuration. Right neck catheter with no confident visualization of the tip due to patient's size. Cardiomegaly and pulmonary edema. No pneumothorax. These results will be called to the ordering clinician or representative by the Radiologist Assistant, and communication documented in the PACS or [employer]. IMPRESSION: 1. Higher endotracheal tube with tip at the level of C7. Would need 4 to 5 cm of advancement to reach the upper chest. 2. Stable to progressive CHF pattern. Electronically Signed   By: Jorje Guild M.D.   On: 06/07/2021 04:59   DG CHEST PORT 1 VIEW  Result Date: 06/04/2021 CLINICAL DATA:  Endotracheal tube.  Pulmonary edema. EXAM: PORTABLE CHEST 1 VIEW COMPARISON:  06/02/2021 FINDINGS: Limited exam secondary to patient body habitus. Endotracheal tube is felt to terminate approximally 3.6 cm above carina. Nasogastric tube extends beyond the inferior aspect of the film. Right internal jugular line tip is difficult to visualize centrally, likely in the low SVC. Cardiomegaly accentuated by AP portable technique. Probable small left pleural effusion, similar. No pneumothorax. Moderate interstitial edema is not significantly changed. Diffuse airspace disease, most confluent in the left lower lobe, unchanged. IMPRESSION: Moderately degraded exam, as detailed above. Grossly similar appearance of the chest, with congestive heart failure, left pleural effusion, and airspace disease which could represent concurrent infection. Electronically Signed   By: Abigail Miyamoto M.D.   On: 06/04/2021 08:47   DG Chest [facility]  Result Date: 06/02/2021 CLINICAL DATA:  ET tube, respiratory failure EXAM: PORTABLE CHEST 1 VIEW COMPARISON:  06/01/2021 FINDINGS: Cardiomegaly. Diffuse bilateral airspace disease, likely edema. Endotracheal tube and NG tube are unchanged. Right central line unchanged. IMPRESSION: Diffuse bilateral airspace disease, likely edema. Support devices stable. Electronically Signed    By: Rolm Baptise M.D.   On: 06/02/2021 08:30   DG CHEST PORT 1 VIEW  Result Date: 06/01/2021 CLINICAL DATA:  Central line placement. EXAM: PORTABLE CHEST 1 VIEW COMPARISON:  Earlier film, same date. FINDINGS: The endotracheal tube is in good position at the mid tracheal level. There is a new NG tube coursing down the esophagus and into the stomach. The right IJ catheter tip projects over region of the upper SVC. No pneumothorax. IMPRESSION: New NG tube in good position. Right IJ central line tip overlies the region of the upper SVC. Electronically Signed   By: Marijo Sanes M.D.   On: 06/01/2021 16:15   DG CHEST PORT 1 VIEW  Result Date: 06/01/2021 CLINICAL DATA:  Central line placement and intubation EXAM: PORTABLE CHEST 1 VIEW COMPARISON:  Earlier same day FINDINGS: Patient is rotated. Endotracheal tube is present and has been retracted. Right IJ central line tip probably overlies SVC. No pneumothorax. Low lung volumes. Bilateral opacities again identified with increased total opacification of left thorax. Cardiomediastinal contours are obscured. IMPRESSION: Endotracheal tube has been retracted. Right IJ central line tip probably overlies SVC. No pneumothorax. Bilateral opacities with increased total opacification of left thorax. Electronically Signed   By: Macy Mis M.D.   On: 06/01/2021 13:53   DG CHEST PORT 1 VIEW  Result Date: 06/01/2021 CLINICAL DATA:  Status post intubation. EXAM: PORTABLE CHEST 1 VIEW COMPARISON:  05/31/2021 FINDINGS: Low volume rotated film with underpenetration. The cardio pericardial silhouette is enlarged. Probable diffuse bilateral airspace disease. Endotracheal tube tip appears to be in the right mainstem bronchus although airway anatomy is poorly demonstrated on this film. Telemetry leads overlie the chest. IMPRESSION: 1. Probable right mainstem intubation. Endotracheal tube could be withdrawn 3-4 cm for tip placement above the carina. 2. I called this report to the  patient's nurse, Tiffany, who was in the room with the patient and directly verbalized these results to the attending physician, Dr. Roosevelt Locks, who was in the room with her. Electronically Signed   By: Misty Stanley M.D.   On: 06/01/2021 07:32   DG Chest Port 1 View  Result Date: 05/31/2021 CLINICAL DATA:  Shortness of breath. EXAM: PORTABLE CHEST 1 VIEW.  Patient is rotated. COMPARISON:  Chest x-ray 07/24/2015 FINDINGS: Marked patient rotation with partial opacification of the right lung which may be due to rotation. Enlarged cardiac silhouette. Question main pulmonary artery enlargement which may be due to rotation. Poorly visualized right heart border which may be due to rotation. Question left pleural effusion. IMPRESSION: 1. Marked patient rotation with limited evaluation. Unable to evaluate the right lung with possible positive findings. Recommend repeat PA and lateral view of the chest. 2. Cardiomegaly with question of left pleural effusion. These results will be called to the ordering clinician or representative by the Radiologist Assistant, and communication documented in the PACS or [employer]. Electronically Signed   By: Iven Finn M.D.   On: 05/31/2021 16:36   ECHOCARDIOGRAM COMPLETE  Result Date: 06/01/2021    ECHOCARDIOGRAM REPORT   Patient Name:   Michael Payne Date of Exam: 06/01/2021 Medical Rec #:  [id number]      Height:  71.0 in Accession #:    [id number]     Weight:       490.0 lb Date of Birth:  1971/02/10       BSA:          3.086 m Patient Age:    69 years       BP:           132/89 mmHg Patient Gender: M              HR:           68 bpm. Exam Location:  Inpatient Procedure: 2D Echo, Color Doppler, Cardiac Doppler and Intracardiac            Opacification Agent Indications:    S34.19 Acute diastolic (congestive) heart failure  History:        Patient has prior history of Echocardiogram examinations, most                 recent 03/12/2018. CHF; Risk Factors:Hypertension,  Diabetes,                 Dyslipidemia and Sleep Apnea. Prior performed at [place]:    [place] Referring Phys: [id number] Lequita Halt  Sonographer Comments: Technically difficult due to patient body habitus; scanned supine on artificial respirator. IMPRESSIONS  1. Left ventricular ejection fraction, by estimation, is 60 to 65%. The left ventricle has normal function. The left ventricle has no regional wall motion abnormalities. There is moderate left ventricular hypertrophy. Left ventricular diastolic parameters were normal.  2. Right ventricular systolic function is normal. The right ventricular size is normal.  3. The mitral valve is normal in structure. No evidence of mitral valve regurgitation. No evidence of mitral stenosis.  4. The aortic valve is tricuspid. Aortic valve regurgitation is not visualized. No aortic stenosis is present.  5. Aortic dilatation noted. There is moderate dilatation of the aortic root, measuring 44 mm. There is moderate dilatation of the ascending aorta, measuring 43 mm. FINDINGS  Left Ventricle: Left ventricular ejection fraction, by estimation, is 60 to 65%. The left ventricle has normal function. The left ventricle has no regional wall motion abnormalities. Definity contrast agent was given IV to delineate the left ventricular  endocardial borders. The left ventricular internal cavity size was normal in size. There is moderate left ventricular hypertrophy. Left ventricular diastolic parameters were normal. Normal left ventricular filling pressure. Right Ventricle: The right ventricular size is normal. No increase in right ventricular wall thickness. Right ventricular systolic function is normal. Left Atrium: Left atrial size was normal in size. Right Atrium: Right atrial size was normal in size. Pericardium: There is no evidence of pericardial effusion. Mitral Valve: The mitral valve is normal in structure. No evidence of mitral valve regurgitation. No evidence  of mitral valve stenosis. Tricuspid Valve: The tricuspid valve is normal in structure. Tricuspid valve regurgitation is trivial. No evidence of tricuspid stenosis. Aortic Valve: The aortic valve is tricuspid. Aortic valve regurgitation is not visualized. No aortic stenosis is present. Pulmonic Valve: The pulmonic valve was normal in structure. Pulmonic valve regurgitation is trivial. No evidence of pulmonic stenosis. Aorta: Aortic dilatation noted. There is moderate dilatation of the aortic root, measuring 44 mm. There is moderate dilatation of the ascending aorta, measuring 43 mm. IAS/Shunts: No atrial level shunt detected by color flow Doppler.  LEFT VENTRICLE PLAX 2D LVIDd:         6.00 cm  Diastology LVIDs:  3.80 cm  LV e' medial:    11.10 cm/s LV PW:         1.20 cm  LV E/e' medial:  7.3 LV IVS:        1.40 cm  LV e' lateral:   10.40 cm/s LVOT diam:     2.60 cm  LV E/e' lateral: 7.8 LV SV:         101 LV SV Index:   33 LVOT Area:     5.31 cm  RIGHT VENTRICLE RV S prime:     17.80 cm/s TAPSE (M-mode): 3.6 cm LEFT ATRIUM             Index       RIGHT ATRIUM           Index LA diam:        3.70 cm 1.20 cm/m  RA Area:     26.00 cm LA Vol (A2C):   81.2 ml 26.31 ml/m RA Volume:   82.70 ml  26.80 ml/m LA Vol (A4C):   88.5 ml 28.68 ml/m LA Biplane Vol: 85.8 ml 27.80 ml/m  AORTIC VALVE LVOT Vmax:   101.00 cm/s LVOT Vmean:  79.500 cm/s LVOT VTI:    0.190 m  AORTA Ao Root diam: 4.40 cm Ao Asc diam:  4.30 cm MITRAL VALVE               TRICUSPID VALVE MV Area (PHT): 2.83 cm    TR Peak grad:   14.2 mmHg MV Decel Time: 268 msec    TR Vmax:        188.67 cm/s MV E velocity: 81.00 cm/s MV A velocity: 65.60 cm/s  SHUNTS MV E/A ratio:  1.23        Systemic VTI:  0.19 m                            Systemic Diam: 2.60 cm Skeet Latch MD Electronically signed by Skeet Latch MD Signature Date/Time: 06/01/2021/4:56:43 PM    Final    VAS [place] LOWER EXTREMITY VENOUS (DVT)  Result Date: 06/01/2021  Lower Venous  DVT Study Patient Name:  QUINCY BOY  Date of Exam:   06/01/2021 Medical Rec #: [id number]       Accession #:    [id number] Date of Birth: Mar 05, 1971        Patient Gender: M Patient Age:   39 years Exam Location:  [facility] Procedure:      VAS [place] LOWER EXTREMITY VENOUS (DVT) Referring Phys: Wynetta Fines --------------------------------------------------------------------------------  Indications: Edema.  Limitations: Body habitus, poor ultrasound/tissue interface and depth of vessels. Patient unable to tolerate all compression maneuvers. Comparison Study: No prior study Performing Technologist: Maudry Mayhew MHA, RDMS, RVT, RDCS  Examination Guidelines: A complete evaluation includes B-mode imaging, spectral Doppler, color Doppler, and power Doppler as needed of all accessible portions of each vessel. Bilateral testing is considered an integral part of a complete examination. Limited examinations for reoccurring indications may be performed as noted. The reflux portion of the exam is performed with the patient in reverse Trendelenburg.  +---------+---------------+---------+-----------+----------+--------------+ RIGHT    CompressibilityPhasicitySpontaneityPropertiesThrombus Aging +---------+---------------+---------+-----------+----------+--------------+ CFV      Full           Yes      Yes        patent                   +---------+---------------+---------+-----------+----------+--------------+  SFJ      Full                               patent                   +---------+---------------+---------+-----------+----------+--------------+ FV Prox                          Yes        patent                   +---------+---------------+---------+-----------+----------+--------------+ FV Mid                           Yes        patent                   +---------+---------------+---------+-----------+----------+--------------+ FV Distal                        Yes         patent                   +---------+---------------+---------+-----------+----------+--------------+ PFV                              Yes        patent                   +---------+---------------+---------+-----------+----------+--------------+ POP      Full           Yes      Yes        patent                   +---------+---------------+---------+-----------+----------+--------------+ PTV      Full                    Yes        patent                   +---------+---------------+---------+-----------+----------+--------------+ PERO     Full                    Yes        patent                   +---------+---------------+---------+-----------+----------+--------------+   +---------+---------------+---------+-----------+----------+--------------+ LEFT     CompressibilityPhasicitySpontaneityPropertiesThrombus Aging +---------+---------------+---------+-----------+----------+--------------+ CFV      Full                    Yes        patent                   +---------+---------------+---------+-----------+----------+--------------+ FV Prox  Full                    Yes        patent                   +---------+---------------+---------+-----------+----------+--------------+ FV Mid                           Yes        patent                   +---------+---------------+---------+-----------+----------+--------------+  FV Distal                        Yes        patent                   +---------+---------------+---------+-----------+----------+--------------+ POP      Full           Yes      Yes        patent                   +---------+---------------+---------+-----------+----------+--------------+ PTV                              Yes        patent                   +---------+---------------+---------+-----------+----------+--------------+ PERO                             Yes        patent                    +---------+---------------+---------+-----------+----------+--------------+     Summary: RIGHT: - There is no evidence of deep vein thrombosis in the lower extremity. However, portions of this examination were limited- see technologist comments above.  - No cystic structure found in the popliteal fossa.  LEFT: - There is no evidence of deep vein thrombosis in the lower extremity. However, portions of this examination were limited- see technologist comments above.  - No cystic structure found in the popliteal fossa.  *See table(s) above for measurements and observations. Electronically signed by Harold Barban MD on 06/01/2021 at 7:11:55 PM.    Final     Microbiology: No results found for this or any previous visit (from the past 240 hour(s)).   Labs: Basic Metabolic Panel: Recent Labs  Lab 06/23/21 0226  NA 136  K 4.0  CL 93*  CO2 31  GLUCOSE 113*  BUN 21*  CREATININE 0.97  CALCIUM 8.9   Liver Function Tests: Recent Labs  Lab 06/23/21 0226  AST 86*  ALT 165*  ALKPHOS 44  BILITOT 0.5  PROT 6.9  ALBUMIN 3.2*   No results for input(s): LIPASE, AMYLASE in the last 168 hours. No results for input(s): AMMONIA in the last 168 hours. CBC: Recent Labs  Lab 06/23/21 0226  WBC 6.3  NEUTROABS 3.2  HGB 12.5*  HCT 42.8  MCV 94.5  PLT 234   Cardiac Enzymes: No results for input(s): CKTOTAL, CKMB, CKMBINDEX, TROPONINI in the last 168 hours. BNP: BNP (last 3 results) Recent Labs    05/31/21 1544  BNP 231.8*    ProBNP (last 3 results) Recent Labs    03/09/21 1232  PROBNP 23.0    CBG: No results for input(s): GLUCAP in the last 168 hours.     Signed:  Erin Hearing ANP [facility] 06/28/2021, 10:31 AM

## 2021-06-28 NOTE — TOC Transition Note (Addendum)
Transition of Care [facility]) - CM/SW Discharge Note   Patient Details  Name: Michael Payne MRN: [id number] Date of Birth: [birth year]  Transition of Care [facility] CM/SW Contact:  Curlene Labrum, RN Phone Number: 06/28/2021, 9:01 AM   Clinical Narrative:    CM met with the patient at the bedside to discuss discharge to home today.  The patient is planning to discharge to his home today via [place] after 1100 today.  [facility] was scheduled for 1100 at the patient's request.  CM called and spoke with [facility] and they will transport the patient home on RA but will bring the patient's [place] equipment with him on transport for home use only.    [facility] was called to notify them that the patient will discharge today and request was made for services today at the home if possible, otherwise, the company will begin services tomorrow.  Discharge summary faxed to [place] via secure email to [email or web address].  [place] agency was called and notified that patient is discharging today to home after 1100 am.  Bethanne Ginger, CM with [employer] was notified that the patient is discharging home today as well so that the Home RT can meet with the patient later on this afternoon to follow up about home trilogy Bipap use.  The patient states that he has been using the Trilogy over the weekend without trouble.  Louie Casa, RN charge nurse on [place] is aware the patient is discharging home.  CM and MSW with DTP Team will continue to follow the patient for discharge needs.     Final next level of care: [place] Barriers to Discharge: Continued Medical Work up, Other (must enter comment) (equipment delivery and home health provider - planned discharge for Monday, 06/28/2021)   Patient Goals and CMS Choice Patient states their goals for this hospitalization and ongoing recovery are:: Patient is agreeable to discharging home with home health and PCS services on  Monday, 06/28/2021 CMS Medicare.gov Compare Post Acute Care list provided to:: Patient Choice offered to / list presented to : Patient  Discharge Placement  Discharge to home today.                     Discharge Plan and Services In-house Referral: Clinical Social Work Discharge Planning Services: CM Consult Post Acute Care Choice: Home Health          DME Arranged: Bipap, Oxygen DME Agency: [facility] (Called Zack Blank at [employer] to f/u regarding equipment delivery to the home.) Date DME Agency Contacted: 06/23/21 Time DME Agency Contacted: 69 Representative spoke with at DME Agency: Andree Coss, [place] with [employer] HH Arranged: RN, Nurse's Aide, CAPS Program, PT, OT, Social Work (CAP referral sent through [place], [place] at [employer] program) [place]: [place] Date [place]: 06/22/21 Time [place]: 1500 Representative spoke with at [place]: [place], [place] at [place] ([place]) Interventions     Readmission Risk Interventions Readmission Risk Prevention Plan 06/28/2021 06/23/2021  Post Dischage Appt Complete Complete  Medication Screening Complete Complete  Transportation Screening Complete Complete  Some recent data might be hidden

## 2021-06-29 ENCOUNTER — Telehealth: Payer: Self-pay | Admitting: Family Medicine

## 2021-06-29 DIAGNOSIS — M1611 Unilateral primary osteoarthritis, right hip: Secondary | ICD-10-CM

## 2021-06-29 MED ORDER — AMLODIPINE BESYLATE 10 MG PO TABS: 10.0000 mg | ORAL_TABLET | Freq: Every day | ORAL | 1 refills | Status: DC

## 2021-06-29 MED ORDER — OXYCODONE-ACETAMINOPHEN 10-325 MG PO TABS: 1.0000 | ORAL_TABLET | Freq: Four times a day (QID) | ORAL | 0 refills | Status: DC | PRN

## 2021-06-29 NOTE — Telephone Encounter (Signed)
Sent. Thanks.   

## 2021-06-29 NOTE — Telephone Encounter (Signed)
Refill request for Oxycodone and amlodipine   LOV - 03/09/21 Next OV - 07/05/21 Last refill - 05/07/21 #120/0

## 2021-06-29 NOTE — Addendum Note (Signed)
Addended by: Joaquim Nam on: 06/29/2021 10:41 AM   Modules accepted: Orders

## 2021-06-29 NOTE — Telephone Encounter (Signed)
  Encourage patient to contact the pharmacy for refills or they can request refills through [facility]  LAST APPOINTMENT DATE:  Please schedule appointment if longer than 1 year  NEXT APPOINTMENT DATE: 07/05/21  MEDICATION: oxyCODONE-acetaminophen (PERCOCET) 10-325 MG tablet  Is the patient out of medication? yes   PH[facility]AC[facility] [address]  Let patient know to contact pharmacy at the end of the day to make sure medication is ready.  Please notify patient to allow 48-72 hours to process  CLINICAL FILLS OUT ALL BELOW:   LAST REFILL:  QTY:  REFILL DATE:    OTHER COMMENTS:    Okay for refill?  Please advise

## 2021-06-29 NOTE — Telephone Encounter (Signed)
  Encourage patient to contact the pharmacy for refills or they can request refills through [facility]  LAST APPOINTMENT DATE:  Please schedule appointment if longer than 1 year  NEXT APPOINTMENT DATE: 10/17  MEDICATION: amLODipine (NORVASC) 10 MG tablet  Is the patient out of medication? yes  [facility] [address]  Let patient know to contact pharmacy at the end of the day to make sure medication is ready.  Please notify patient to allow 48-72 hours to process  CLINICAL FILLS OUT ALL BELOW:   LAST REFILL:  QTY:  REFILL DATE:    OTHER COMMENTS:    Okay for refill?  Please advise

## 2021-06-29 NOTE — Telephone Encounter (Signed)
Annabelle Harman called in from [place] and stated that he is not on any statin drugs and he has a new diagnosis of heart failure.

## 2021-06-29 NOTE — Telephone Encounter (Signed)
Given his recent LFT elevation I wouldn't start a statin yet.  But if Annabelle Harman from [place] is going to assume care for the patient then please let me know.  Thanks.

## 2021-06-30 ENCOUNTER — Telehealth: Payer: Self-pay | Admitting: Family Medicine

## 2021-06-30 NOTE — Telephone Encounter (Signed)
Please give the order.  Thanks.   

## 2021-06-30 NOTE — Telephone Encounter (Signed)
Melissa from [facility] called stating that pt was in the hospital on 05/31/21 and was discharged on 06/28/21. Melissa states that PCS(Personal Care services) denied services.Melissa  wanted to put in orders for home health care.[phone] [id number]

## 2021-06-30 NOTE — Progress Notes (Signed)
Pt d/c'd 06/28/2021 - RN scheduled f/u for 07/06/2021

## 2021-07-01 DIAGNOSIS — J9621 Acute and chronic respiratory failure with hypoxia: Secondary | ICD-10-CM | POA: Diagnosis not present

## 2021-07-01 DIAGNOSIS — E119 Type 2 diabetes mellitus without complications: Secondary | ICD-10-CM | POA: Diagnosis not present

## 2021-07-01 DIAGNOSIS — J9622 Acute and chronic respiratory failure with hypercapnia: Secondary | ICD-10-CM | POA: Diagnosis not present

## 2021-07-01 DIAGNOSIS — L89892 Pressure ulcer of other site, stage 2: Secondary | ICD-10-CM | POA: Diagnosis not present

## 2021-07-01 DIAGNOSIS — D649 Anemia, unspecified: Secondary | ICD-10-CM | POA: Diagnosis not present

## 2021-07-01 DIAGNOSIS — K219 Gastro-esophageal reflux disease without esophagitis: Secondary | ICD-10-CM | POA: Diagnosis not present

## 2021-07-01 DIAGNOSIS — M109 Gout, unspecified: Secondary | ICD-10-CM | POA: Diagnosis not present

## 2021-07-01 DIAGNOSIS — I5030 Unspecified diastolic (congestive) heart failure: Secondary | ICD-10-CM | POA: Diagnosis not present

## 2021-07-01 DIAGNOSIS — I110 Hypertensive heart disease with heart failure: Secondary | ICD-10-CM | POA: Diagnosis not present

## 2021-07-02 ENCOUNTER — Telehealth: Payer: Medicare HMO

## 2021-07-02 ENCOUNTER — Telehealth: Payer: Self-pay | Admitting: Family Medicine

## 2021-07-02 DIAGNOSIS — E119 Type 2 diabetes mellitus without complications: Secondary | ICD-10-CM | POA: Diagnosis not present

## 2021-07-02 DIAGNOSIS — D649 Anemia, unspecified: Secondary | ICD-10-CM | POA: Diagnosis not present

## 2021-07-02 DIAGNOSIS — I110 Hypertensive heart disease with heart failure: Secondary | ICD-10-CM | POA: Diagnosis not present

## 2021-07-02 DIAGNOSIS — M109 Gout, unspecified: Secondary | ICD-10-CM | POA: Diagnosis not present

## 2021-07-02 DIAGNOSIS — L89892 Pressure ulcer of other site, stage 2: Secondary | ICD-10-CM | POA: Diagnosis not present

## 2021-07-02 DIAGNOSIS — J9622 Acute and chronic respiratory failure with hypercapnia: Secondary | ICD-10-CM | POA: Diagnosis not present

## 2021-07-02 DIAGNOSIS — J9621 Acute and chronic respiratory failure with hypoxia: Secondary | ICD-10-CM | POA: Diagnosis not present

## 2021-07-02 DIAGNOSIS — I5030 Unspecified diastolic (congestive) heart failure: Secondary | ICD-10-CM | POA: Diagnosis not present

## 2021-07-02 DIAGNOSIS — K219 Gastro-esophageal reflux disease without esophagitis: Secondary | ICD-10-CM | POA: Diagnosis not present

## 2021-07-02 NOTE — Telephone Encounter (Signed)
Michael Payne with [facility] called requesting verbal orders for home health PT 1x a week for 9 weeks

## 2021-07-02 NOTE — Telephone Encounter (Signed)
I have tried to call Michael Payne back twice and has not answered either time nor does she have a VM to leave a message.

## 2021-07-04 NOTE — Telephone Encounter (Signed)
Please give the order.  Thanks.   

## 2021-07-05 ENCOUNTER — Other Ambulatory Visit: Payer: Self-pay

## 2021-07-05 ENCOUNTER — Telehealth (INDEPENDENT_AMBULATORY_CARE_PROVIDER_SITE_OTHER): Payer: Medicare HMO | Admitting: Family Medicine

## 2021-07-05 ENCOUNTER — Telehealth: Payer: Self-pay | Admitting: Family Medicine

## 2021-07-05 ENCOUNTER — Encounter: Payer: Self-pay | Admitting: Family Medicine

## 2021-07-05 DIAGNOSIS — Z7401 Bed confinement status: Secondary | ICD-10-CM

## 2021-07-05 DIAGNOSIS — G4733 Obstructive sleep apnea (adult) (pediatric): Secondary | ICD-10-CM | POA: Diagnosis not present

## 2021-07-05 DIAGNOSIS — I5031 Acute diastolic (congestive) heart failure: Secondary | ICD-10-CM | POA: Diagnosis not present

## 2021-07-05 NOTE — Progress Notes (Signed)
Virtual visit completed through WebEx or similar program Patient location: home  Provider location: [place] at [facility] office  Participants: Patient and me (unless stated otherwise below)  Pandemic considerations d/w pt.   Limitations and rationale for visit method d/w patient.  Patient agreed to proceed.   CC: Inpatient follow-up.  HPI:  Inpatient course discussed with patient.  Med list reviewed.   TAKE these medications     acetaminophen 325 MG tablet Commonly known as: TYLENOL Take 2 tablets (650 mg total) by mouth every 6 (six) hours as needed for headache or mild pain.    allopurinol 100 MG tablet Commonly known as: ZYLOPRIM Take 1 tablet (100 mg total) by mouth daily. What changed:  medication strength how much to take when to take this    amLODipine 10 MG tablet Commonly known as: NORVASC Take 1 tablet (10 mg total) by mouth daily.    carvedilol 3.125 MG tablet Commonly known as: COREG Take 1 tablet (3.125 mg total) by mouth 2 (two) times daily with a meal.    colchicine 0.6 MG tablet Commonly known as: Colcrys Take 1 tablet (0.6 mg total) by mouth daily as needed.    diclofenac Sodium 1 % Gel Commonly known as: VOLTAREN Apply 4 g topically 4 (four) times daily.    Ensure Max Protein Liqd Take 330 mLs (11 oz total) by mouth 2 (two) times daily.    furosemide 40 MG tablet Commonly known as: LASIX Take 1 tablet (40 mg total) by mouth daily.    naloxone 4 MG/0.1ML Liqd nasal spray kit Commonly known as: NARCAN Spray nostril if needed for opioid overuse- decreased consciousness, decreased respirations    oxyCODONE-acetaminophen 10-325 MG tablet Commonly known as: PERCOCET Take 1 tablet by mouth every 6 (six) hours as needed for pain.    polyethylene glycol 17 g packet Commonly known as: MIRALAX / GLYCOLAX Take 17 g by mouth daily as needed for moderate constipation.    potassium chloride 10 MEQ tablet Commonly known as: KLOR-CON Take 1  tablet (10 mEq total) by mouth daily.    Trulicity 6.76 PP/5.0DT Sopn Generic drug: Dulaglutide INJECT 0.75 MG INTO THE SKIN ONCE A WEEK.       In summary, patient was admitted with acute respiratory failure requiring intubation and ICU management.  He required significant diuresis and improved to the point where he was able to be discharged.  There was no facility that was able to take him as a patient.  He is trying to get set up with services at home.  He is on a call regarding PCS through [employer].  He is using BiPAP at night, compliant.  Rationale for use discussed with patient.  He has been previously referred to pulmonary.  He is going to be due for follow-up labs.  Discussed with patient.  Discussed with patient about pathophysiology of respiratory failure and diuresis.  He still need follow-up with bariatric clinic to try to get his weight down.  He still has significant joint pain, especially hip pain.  He got his mattress rotated in the meantime and that has helped.  He still taking oxycodone pain medication at baseline.  Meds and allergies reviewed.   ROS: Per HPI unless specifically indicated in ROS section   NAD Speech wnl  A/P: Status post vent dependent respiratory failure/CHF requiring significant diuresis, now extubated and at home. No fevers. Breathing is clearly better.  No sputum except for some throat clearing.  Off abx.  He is off O2 currently.  Continue BiPAP use at night. He has PCS pending through [employer].  We will see about getting his follow-up labs collected at home.  Continue amlodipine carvedilol and furosemide in the meantime.  Chronic pain.  Still with hip pain.  Rotated his mattress.  That helped.  Continue Percocet at baseline.  Need bariatric f/u we talked about weight reduction strategies and specifically surgery/bariatric clinic follow-up.  He'll call about surgery clinic f/u.

## 2021-07-05 NOTE — Telephone Encounter (Signed)
Please give the order.  Thanks.   

## 2021-07-05 NOTE — Telephone Encounter (Signed)
Michael Payne with [facility] called she is needing Verbal orders for nursing  1x for 6wks for CHF Please call [phone]

## 2021-07-05 NOTE — Telephone Encounter (Signed)
Verbal orders given to [facility] for requested PT

## 2021-07-06 ENCOUNTER — Ambulatory Visit (INDEPENDENT_AMBULATORY_CARE_PROVIDER_SITE_OTHER): Payer: Medicare HMO

## 2021-07-06 ENCOUNTER — Telehealth: Payer: Self-pay

## 2021-07-06 ENCOUNTER — Ambulatory Visit: Payer: Medicare HMO | Admitting: *Deleted

## 2021-07-06 DIAGNOSIS — I5031 Acute diastolic (congestive) heart failure: Secondary | ICD-10-CM

## 2021-07-06 DIAGNOSIS — I10 Essential (primary) hypertension: Secondary | ICD-10-CM

## 2021-07-06 DIAGNOSIS — Z7401 Bed confinement status: Secondary | ICD-10-CM

## 2021-07-06 DIAGNOSIS — M1611 Unilateral primary osteoarthritis, right hip: Secondary | ICD-10-CM

## 2021-07-06 DIAGNOSIS — E119 Type 2 diabetes mellitus without complications: Secondary | ICD-10-CM

## 2021-07-06 DIAGNOSIS — F339 Major depressive disorder, recurrent, unspecified: Secondary | ICD-10-CM

## 2021-07-06 DIAGNOSIS — G8929 Other chronic pain: Secondary | ICD-10-CM

## 2021-07-06 DIAGNOSIS — G4733 Obstructive sleep apnea (adult) (pediatric): Secondary | ICD-10-CM

## 2021-07-06 DIAGNOSIS — M25562 Pain in left knee: Secondary | ICD-10-CM

## 2021-07-06 DIAGNOSIS — M25561 Pain in right knee: Secondary | ICD-10-CM

## 2021-07-06 NOTE — Telephone Encounter (Signed)
Verbal orders have been given to [facility].

## 2021-07-06 NOTE — Progress Notes (Addendum)
Chronic Care Management Pharmacy Assistant   Name: Michael Payne  MRN: [id number] DOB: [birth year]  Reason for Encounter: CCM (General Adherence)   Recent office visits:  07/05/2021 - Crawford Givens, MD - Video Visit - Patient presented for hospital follow up. Stop: Ensure Max Protein (ENSURE MAX PROTEIN) LIQD as patient is not taking.  05/15/2021 - Chassidy McAdoo, CMA - Patient presented for Annual Wellness Visit. Labs: CT Chest Lung CA Screen Low Dose wo CM.Referral for CT Lung Cancer Screening. Referral for [employer].  04/06/2021 - Janalyn Shy, LPN - Patient presented for Annual Wellness Visit. No medication changes.   Recent consult visits:  05/28/2021 - Gastroenterology - Patient presented for office consultation for initial bariatric surgery consultation.  05/25/2021 - Reece Levy, LCSW - Telephone - Patient presented for CCM visit.  05/17/2021 - George Ina, RN - Telephone - Patient presented for CCM visit.  05/17/2021 - Reece Levy, LCSW - Telephone - Patient presented for CCM visit.  05/17/2021 - Internal Medicine - Patient presented for major depressive disorder. Procedure: [facility] IND Psychotherapy. 05/10/2021 - Internal Medicine - Patient presented for major depressive disorder. Procedure: [facility] PHP IND Psychotherapy. 05/04/2021 - Internal Medicine - Patient presented for major depressive disorder. Procedure: [facility] IND Psychotherapy. 04/30/2021 - [employer] - Patient presented for Primary Generalized Osteo Arthritis. No other information. 04/26/2021 - [employer] - Patient presented for Primary Generalized Osteo Arthritis. No other information. 04/21/2021 - Reece Levy, LCSW - Telephone - Patient presented for CCM visit.  04/20/2021 - [employer] - Patient presented for Primary Generalized Osteo Arthritis. No other information. 04/19/2021 - Location Unknown - Patient presented for urinary incontinence. No other  information.  04/19/2021 - Internal Medicine - Patient presented for major depressive disorder. Procedure: [facility] PHP Assessment.  04/15/2021 - [employer] - Patient presented for Primary Generalized Osteo Arthritis. No other information.  04/13/2021 - George Ina, RN - Telephone -  Patient presented for CCM visit.  04/08/2021 - [employer] - Patient presented for Primary Generalized Osteo Arthritis. No other information.  04/05/2021 - Reece Levy, LCSW - Telephone - Patient presented for CCM visit.  04/02/2021 - [employer] - Patient presented for Primary Generalized Osteo Arthritis. No other information.  03/31/2021 - [employer] - Patient presented for Primary Generalized Osteo Arthritis. No other information.  03/26/2021 - Location Unknown - Patient presented for urinary incontinence. No other information.  03/25/2021 - [employer] - Patient presented for Primary Generalized Osteo Arthritis. No other information.  03/23/2021 - [employer] - Patient presented for Primary Generalized Osteo Arthritis. No other information.  03/18/2021 - [employer] - Patient presented for Primary Generalized Osteo Arthritis. No other information.   Hospital visits:  Medication Reconciliation was completed by comparing discharge summary, patient's EMR and Pharmacy list, and upon discussion with patient.  Admitted to the hospital on 06/01/2021 due to acute diastolic congestive heart failure.Marland Kitchen Discharge date was 10/10. Discharged from [facility].    New?Medications Started at [facility] Discharge:?? -started acetaminophen (TYLENOL) 325 MG tablet - take 2 tablets (650 mg total) by mouth every 6 (six) hours as needed for headache or mild pain., Starting Thu 06/24/2021, OTC -started carvedilol (COREG) 3.125 MG tablet - take 1 tablet (3.125 mg total) by mouth 2 (two) times daily with a meal., Starting Tue 06/15/2021, Until Thu  10/27/202 -started diclofenac Sodium (VOLTAREN) 1 % GEL - Apply 4 g topically 4 (  four) times daily., Starting Mon 06/28/2021 -started Esnure Max Protein -started furosemide (LASIX) 40 MG tablet - Take 1 tablet (40 mg total) by mouth daily., Starting Wed 06/16/2021, Until Fri 10/28/202 -started polyethylene glycol (MIRALAX / GLYCOLAX) 17 g packet - Take 17 g by mouth daily as needed for moderate constipation., Starting Tue 06/15/2021 -started potassium chloride (KLOR-CON) 10 MEQ tablet - Take 1 tablet (10 mEq total) by mouth daily., Starting Wed 06/16/2021, Until Fri 07/16/2021  Medication Changes at Hospital Discharge: -Changed allopurinol (ZYLOPRIM) 100 MG tablet - Take 1 tablet (100 mg total) by mouth daily., Starting Fri 06/25/2021  Medications Discontinued at Hospital Discharge: -Stopped diclofenac Sodium (VOLTAREN) 1 % GEL -Stopped Lidocaine 5% -Stopped Tizanidine 4mg   Medications that remain the same after Hospital Discharge:??  -All other medications will remain the same.    Medications: Outpatient Encounter Medications as of 07/06/2021  Medication Sig   acetaminophen (TYLENOL) 325 MG tablet Take 2 tablets (650 mg total) by mouth every 6 (six) hours as needed for headache or mild pain.   allopurinol (ZYLOPRIM) 100 MG tablet Take 1 tablet (100 mg total) by mouth daily.   amLODipine (NORVASC) 10 MG tablet Take 1 tablet (10 mg total) by mouth daily.   carvedilol (COREG) 3.125 MG tablet Take 1 tablet (3.125 mg total) by mouth 2 (two) times daily with a meal.   colchicine (COLCRYS) 0.6 MG tablet Take 1 tablet (0.6 mg total) by mouth daily as needed.   diclofenac Sodium (VOLTAREN) 1 % GEL Apply 4 g topically 4 (four) times daily.   furosemide (LASIX) 40 MG tablet Take 1 tablet (40 mg total) by mouth daily.   naloxone (NARCAN) nasal spray 4 mg/0.1 mL Spray nostril if needed for opioid overuse- decreased consciousness, decreased respirations   oxyCODONE-acetaminophen (PERCOCET) 10-325 MG  tablet Take 1 tablet by mouth every 6 (six) hours as needed for pain.   polyethylene glycol (MIRALAX / GLYCOLAX) 17 g packet Take 17 g by mouth daily as needed for moderate constipation.   potassium chloride (KLOR-CON) 10 MEQ tablet Take 1 tablet (10 mEq total) by mouth daily.   TRULICITY 0.75 MG/0.5ML SOPN INJECT 0.75 MG INTO THE SKIN ONCE A WEEK.   No facility-administered encounter medications on file as of 07/06/2021.   Contacted Hen[facility] L Batty on 07/06/2021 for general disease state and medication adherence call.   Patient is not > 5 days past due for refill on the following medications per chart history:  [facility] Medications: Medication Name/mg Last Fill Days Supply Trulicity 0.75mg   06/16/2021 28  What concerns do you have about your medications? None at this time  The patient denies side effects with his medications. Constipation - uses a natural form of Miralax. Patient has been eating fruit and it has helped with his constipation. He is no longer constipated.   How often do you forget or accidentally miss a dose? Never  Do you use a pillbox? No - Patient states he has one, but he can not find it.   Are you having any problems getting your medications from your pharmacy? No  Has the cost of your medications been a concern? No  Since last visit with CPP, the following interventions have been made: Stop: Ensure Max Protein (ENSURE MAX PROTEIN) LIQD as patient is not taking. Patient is following the instructions from the hospital and following the medications he was asked to take upon discharge (see above).   The patient has had an ED visit since last contact.  The patient denies problems with their health. Patient states he is doing much better than before his hospital visit. Sleeping 8 hours a night instead of 4. He is feeling much better overall.   he denies  concerns or questions for Phil Dopp, Pharm. D at this time.   Counseled patient on:  Access to CCM team for any  cost, medication or pharmacy concerns.  Care Gaps: Annual wellness visit in last year? Yes Most Recent BP reading: 145/80 on 06/28/2021  If Diabetic: Most recent A1C reading: 5.9 on 06/01/2021 Last eye exam / retinopathy screening: Never done Last diabetic foot exam: Up to date  No appointments scheduled within the next 30 days.  Phil Dopp, CPP notified  Claudina Lick, [place] Clinical Pharmacy Assistant [phone]  I have reviewed the care management and care coordination activities outlined in this encounter and I am certifying that I agree with the content of this note. No further action required.  Phil Dopp, PharmD Clinical Pharmacist [facility] [phone]

## 2021-07-06 NOTE — Telephone Encounter (Signed)
Tried calling Michael Payne at [facility] again today and no answer and can not leave a message.

## 2021-07-07 ENCOUNTER — Telehealth: Payer: Self-pay | Admitting: *Deleted

## 2021-07-07 ENCOUNTER — Telehealth: Payer: Self-pay | Admitting: Family Medicine

## 2021-07-07 DIAGNOSIS — I110 Hypertensive heart disease with heart failure: Secondary | ICD-10-CM | POA: Diagnosis not present

## 2021-07-07 DIAGNOSIS — M109 Gout, unspecified: Secondary | ICD-10-CM | POA: Diagnosis not present

## 2021-07-07 DIAGNOSIS — D649 Anemia, unspecified: Secondary | ICD-10-CM | POA: Diagnosis not present

## 2021-07-07 DIAGNOSIS — J9621 Acute and chronic respiratory failure with hypoxia: Secondary | ICD-10-CM | POA: Diagnosis not present

## 2021-07-07 DIAGNOSIS — J9622 Acute and chronic respiratory failure with hypercapnia: Secondary | ICD-10-CM | POA: Diagnosis not present

## 2021-07-07 DIAGNOSIS — K219 Gastro-esophageal reflux disease without esophagitis: Secondary | ICD-10-CM | POA: Diagnosis not present

## 2021-07-07 DIAGNOSIS — E119 Type 2 diabetes mellitus without complications: Secondary | ICD-10-CM | POA: Diagnosis not present

## 2021-07-07 DIAGNOSIS — I5030 Unspecified diastolic (congestive) heart failure: Secondary | ICD-10-CM | POA: Diagnosis not present

## 2021-07-07 DIAGNOSIS — L89892 Pressure ulcer of other site, stage 2: Secondary | ICD-10-CM | POA: Diagnosis not present

## 2021-07-07 NOTE — Telephone Encounter (Signed)
   Pre-operative Risk Assessment    Patient Name: Michael Payne  DOB: 10-06-1970 MRN: [id number]      Request for Surgical Clearance   Procedure:   ROBOTIC SLEEVE GASTRECTOMY   Date of Surgery: Clearance TBD                                 Surgeon:  DR. PAUL [name] Surgeon's Group or Practice Name:  [facility] Phone number:  [phone] Fax number:  [phone]   Type of Clearance Requested: - Medical    Type of Anesthesia:   Not Indicated   Additional requests/questions: PT WILL NEED TO BE SET UP AS ANEW PT AS REFERRAL WAS SENT TO CARDIOLOGY. ROSE J. CMA ENTERED IN REFERRAL INTO Epic AND HAS SENT A MESSAGE TO OUR SCHEDULING TEAM TO REACH TO THE PT WITH AN APPT.   Elpidio Anis   07/07/2021, 3:12 PM

## 2021-07-07 NOTE — Telephone Encounter (Signed)
[facility] called they need verbal orders for OT  1wk for 8wks

## 2021-07-07 NOTE — Patient Instructions (Signed)
Visit Information  PATIENT GOALS:  Goals Addressed   None     The patient verbalized understanding of instructions, educational materials, and care plan provided today and declined offer to receive copy of patient instructions, educational materials, and care plan.   Telephone follow up appointment with care management team member scheduled for:08/13/21  Charmine Bockrath MSW, LCSW Licensed Clinical Social Worker [facility] [phone]  

## 2021-07-07 NOTE — Chronic Care Management (AMB) (Signed)
Chronic Care Management   CCM RN Visit Note  07/07/2021 Name: Michael Payne MRN: [id number] DOB: March 26, 1971  Subjective: Michael Payne is a 50 y.o. year old male who is a primary care patient of Joaquim Nam, MD. The care management team was consulted for assistance with disease management and care coordination needs.    Engaged with patient by telephone for follow up visit in response to provider referral for case management and/or care coordination services.   Consent to Services:  The patient was given information about Chronic Care Management services, agreed to services, and gave verbal consent prior to initiation of services.  Please see initial visit note for detailed documentation.   Patient agreed to services and verbal consent obtained.   Assessment: Review of patient past medical history, allergies, medications, health status, including review of consultants reports, laboratory and other test data, was performed as part of comprehensive evaluation and provision of chronic care management services.   SDOH (Social Determinants of Health) assessments and interventions performed:    CCM Care Plan  Allergies  Allergen Reactions   Aspirin Other (See Comments)    Upset stomach   Lactose Intolerance (Gi) Nausea And Vomiting   Lisinopril     Causes cough    Outpatient Encounter Medications as of 07/06/2021  Medication Sig   acetaminophen (TYLENOL) 325 MG tablet Take 2 tablets (650 mg total) by mouth every 6 (six) hours as needed for headache or mild pain.   allopurinol (ZYLOPRIM) 100 MG tablet Take 1 tablet (100 mg total) by mouth daily.   amLODipine (NORVASC) 10 MG tablet Take 1 tablet (10 mg total) by mouth daily.   carvedilol (COREG) 3.125 MG tablet Take 1 tablet (3.125 mg total) by mouth 2 (two) times daily with a meal.   colchicine (COLCRYS) 0.6 MG tablet Take 1 tablet (0.6 mg total) by mouth daily as needed.   diclofenac Sodium (VOLTAREN) 1 % GEL Apply 4 g  topically 4 (four) times daily.   furosemide (LASIX) 40 MG tablet Take 1 tablet (40 mg total) by mouth daily.   naloxone (NARCAN) nasal spray 4 mg/0.1 mL Spray nostril if needed for opioid overuse- decreased consciousness, decreased respirations   oxyCODONE-acetaminophen (PERCOCET) 10-325 MG tablet Take 1 tablet by mouth every 6 (six) hours as needed for pain.   polyethylene glycol (MIRALAX / GLYCOLAX) 17 g packet Take 17 g by mouth daily as needed for moderate constipation.   potassium chloride (KLOR-CON) 10 MEQ tablet Take 1 tablet (10 mEq total) by mouth daily.   TRULICITY 0.75 MG/0.5ML SOPN INJECT 0.75 MG INTO THE SKIN ONCE A WEEK.   No facility-administered encounter medications on file as of 07/06/2021.    Patient Active Problem List   Diagnosis Date Noted   Nonobstructive transaminitis 06/28/2021   Decubitus ulcer of posterior thigh, stage 2 (HCC) 06/28/2021   Acute kidney injury (resolved) 06/28/2021   Pre-diabetes 06/10/2021   Obesity, Class III, BMI 40-49.9 (morbid obesity) (HCC) 06/10/2021   Osteoarthritis 06/10/2021   Pressure injury of skin 06/09/2021   Respiratory failure (HCC) 06/01/2021   Acute diastolic CHF (congestive heart failure) (HCC) 05/31/2021   CHF (congestive heart failure) (HCC) 05/31/2021   Hypoxia 05/31/2021   Gout, unspecified 03/10/2021   Edema 03/10/2021   Diabetes mellitus without complication (HCC) 03/10/2021   Muscle spasms of both lower extremities 07/29/2020   Bedbound 05/22/2020   Unilateral osteoarthritis of hip, right 12/27/2019   Severe obstructive sleep apnea 02/22/2018   Hypertriglyceridemia 08/24/2017  Low HDL (under 40) 08/24/2017   Metabolic syndrome 08/24/2017   Morbid obesity (HCC) 10/14/2013   HTN (hypertension) 10/14/2013    Conditions to be addressed/monitored:CHF, HTN, Osteoarthritis, and chronic pain  Care Plan : Cardiovascular  Updates made by Otho Ket, RN since 07/07/2021 12:00 AM     Problem: Symptom  management of HF, hypertension   Priority: High     Long-Range Goal: Hypertension / Heart failure monitored and managed   Start Date: 02/11/2021  Expected End Date: 11/16/2021  This Visit's Progress: On track  Recent Progress: On track  Priority: High  Note:   Objective:  Last practice recorded BP readings:  BP Readings from Last 3 Encounters:  06/28/21 (!) 145/80  03/09/21 (!) 160/119  11/05/20 127/86  Current Barriers: Per chart review patient admitted to hospital from 06/01/2021 - 06/28/2021 for respiratory failure, hypoxia, congestive heart failure.  Patient states he currently has home health services ( RN, PT, OT, CNA) services, home O2, hospital bed with trapeze, [place] lift, wheelchair and walker.  Patient states prior to admission he was receiving aide services 7 days per week with [facility].  Patient states he has since been discharged from the services and is currently working with [employer] to secure services with another provider.  Patient states his daughter and wife are helping him with ADL's and IADL's. He states he is also receiving meals his [facility]. He reports having a post hospital video follow up visit with his primary care provider on 07/05/2021.  Patient reports his oxygen usage is as needed.  He reports his pulse oximetry readings have ranged from 91-97%.  Patient denies any increase in symptoms related to shortness of breath or swelling in lower extremities.    Knowledge Deficits related to long term care plan for self management of  heart failure and hypertension pathophysiology and self care management Transportation barriers Not attend all scheduled provider appointments Unable to perform ADLs independently Case Manager Clinical Goal(s):  patient will demonstrate improved adherence to prescribed treatment plan for hypertension as evidenced by taking all medications as prescribed, monitoring and recording blood pressure as directed, adhering to low sodium/DASH  diet patient will verbalize basic understanding of heart failure / hypertension disease process and self health management plan. Interventions:  Collaboration with Joaquim Nam, MD regarding development and update of comprehensive plan of care as evidenced by provider attestation and co-signature:    Patient states he has been in contact with the bariatric clinic and has to complete paperwork to return to them. Per chart review of primary care providers' 03/09/2021 note provider will look at ordering [facility] lift for patient.  Inter-disciplinary care team collaboration (see longitudinal plan of care) Evaluation of current treatment plan related to hypertension self management and patient's adherence to plan as established by provider. Provided verbal / written education regarding heart failure and heart failure action plan Reviewed medications with patient and discussed importance of compliance Discussed plans with patient for ongoing care management follow up and provided patient with direct contact information for care management team Advised patient, providing education and rationale, to monitor blood pressure daily and record, calling PCP for findings outside established parameters.  Reviewed scheduled/upcoming provider appointments Discussed importance of using CPAP machine for sleep apnea Self-Care Activities: - Self administers medications as prescribed Calls provider office for new concerns, questions, or BP outside discussed parameters Checks BP and records as discussed Monitors for signs/ symptoms of heart failure Follows a low sodium diet/DASH diet Patient  Goals: - Continue to check or have blood pressure checked 2-3 times per week  and record - Continue to follow low sodium/ DASH diet and eat more fruits, vegetables and lean meats. Cook meats by broiling, baking or roasting. Drink plenty of water, read food labels and use smaller plates for portion control.  -  Continue to take your  medications as prescribed and refill timely.  - Attend doctor visits whether virtually and/ or in person (when transportation needs are arranged).  - Use CPAP as recommended by your doctor.  - Review education information sent to you in my chart on heart failure and heart failure action plan - Notify your provider as soon as possible for new or ongoing symptoms ( heart failure symptoms: weight gain of 3lbs in 1 day or 5 lbs in 1 week, swelling in feet, ankles, stomach, or hands, harder to breath when lying down, needing to sit up, less energy more tired, chest discomfort, heaviness or pain) - Continue to work with Licensed clinical Child psychotherapist as needed       Plan:The patient has been provided with contact information for the care management team and has been advised to call with any health related questions or concerns.  The care management team will reach out to the patient again over the next 1-2 months . George Ina RN,BSN,CCM RN Case Manager [facility] [place]  [phone]

## 2021-07-07 NOTE — Patient Instructions (Signed)
Visit Information:  Thank you for taking the time to speak with me today  PATIENT GOALS:  Goals Addressed             This Visit's Progress    COMPLETED: Monitoring and management of High Blood pressure       Goal combined with heart failure ( cardiovascular)  Timeframe:  Long-Range Goal Priority:  Medium Start Date:  02/11/2021                          Expected End Date:    07/19/2021        Follow up: 05/17/2021          Patient Goals:  - Continue to check or have blood pressure checked 2-3 times per week  - write blood pressure results in a log or diary - Continue to follow low sodium/ DASH diet and eat more fruits, vegetables and lean meats. Cook meats by broiling, baking or roasting. Drink plenty of water, read food labels and use smaller plates for portion control.  -  Continue to take your medications as prescribed.  - Attend doctor visits whether virtually and/ or in person (when transportation needs are arranged).  - Use CPAP as recommended by your doctor.       Track and Manage Symptoms-Heart Failure / hypertension   On track    Timeframe:  Long-Range Goal Priority:  High Start Date:   02/11/2021 for hypertension   07/06/2021 for Heart failure                      Expected End Date:    11/16/2020                   Follow Up Date 08/31/2021    - Continue to check or have blood pressure checked 2-3 times per week  and record - Continue to follow low sodium/ DASH diet and eat more fruits, vegetables and lean meats. Cook meats by broiling, baking or roasting. Drink plenty of water, read food labels and use smaller plates for portion control.  -  Continue to take your medications as prescribed and refill timely.  - Attend doctor visits whether virtually and/ or in person (when transportation needs are arranged).  - Use CPAP as recommended by your doctor.  - Review education information sent to you in my chart on heart failure and heart failure action plan - Notify your  provider as soon as possible for new or ongoing symptoms ( heart failure symptoms: weight gain of 3lbs in 1 day or 5 lbs in 1 week, swelling in feet, ankles, stomach, or hands, harder to breath when lying down, needing to sit up, less energy more tired, chest discomfort, heaviness or pain) - Continue to work with Licensed clinical Child psychotherapist as needed   Why is this important?   You will be able to handle your symptoms better if you keep track of them.  Making some simple changes to your lifestyle will help.  Eating healthy is one thing you can do to take good care of yourself.            Patient verbalizes understanding of instructions provided today and agrees to view in MyChart.   The patient has been provided with contact information for the care management team and has been advised to call with any health related questions or concerns.  The care management team will reach out  to the patient again over the next 1-2 months.   Quinn Plowman RN,BSN,CCM RN Case Manager [place]  [phone]

## 2021-07-07 NOTE — Chronic Care Management (AMB) (Signed)
Chronic Care Management    Clinical Social Work Note  07/07/2021 Name: Michael Payne MRN: [id number] DOB: 05-12-1971  Michael Payne is a 50 y.o. year old male who is a primary care patient of Michael Nam, MD. The CCM team was consulted to assist the patient with chronic disease management and/or care coordination needs related to: [employer] , Level of Care Concerns, and Caregiver Stress.   Engaged with patient by telephone for follow up visit in response to provider referral for social work chronic care management and care coordination services.   Consent to Services:  The patient was given information about Chronic Care Management services, agreed to services, and gave verbal consent prior to initiation of services.  Please see initial visit note for detailed documentation.   Patient agreed to services and consent obtained.   Assessment: Review of patient past medical history, allergies, medications, and health status, including review of relevant consultants reports was performed today as part of a comprehensive evaluation and provision of chronic care management and care coordination services.     SDOH (Social Determinants of Health) assessments and interventions performed:    Advanced Directives Status: Not addressed in this encounter.  CCM Care Plan  Allergies  Allergen Reactions   Aspirin Other (See Comments)    Upset stomach   Lactose Intolerance (Gi) Nausea And Vomiting   Lisinopril     Causes cough    Outpatient Encounter Medications as of 07/06/2021  Medication Sig   acetaminophen (TYLENOL) 325 MG tablet Take 2 tablets (650 mg total) by mouth every 6 (six) hours as needed for headache or mild pain.   allopurinol (ZYLOPRIM) 100 MG tablet Take 1 tablet (100 mg total) by mouth daily.   amLODipine (NORVASC) 10 MG tablet Take 1 tablet (10 mg total) by mouth daily.   carvedilol (COREG) 3.125 MG tablet Take 1 tablet (3.125 mg total) by mouth 2 (two) times  daily with a meal.   colchicine (COLCRYS) 0.6 MG tablet Take 1 tablet (0.6 mg total) by mouth daily as needed.   diclofenac Sodium (VOLTAREN) 1 % GEL Apply 4 g topically 4 (four) times daily.   furosemide (LASIX) 40 MG tablet Take 1 tablet (40 mg total) by mouth daily.   naloxone (NARCAN) nasal spray 4 mg/0.1 mL Spray nostril if needed for opioid overuse- decreased consciousness, decreased respirations   oxyCODONE-acetaminophen (PERCOCET) 10-325 MG tablet Take 1 tablet by mouth every 6 (six) hours as needed for pain.   polyethylene glycol (MIRALAX / GLYCOLAX) 17 g packet Take 17 g by mouth daily as needed for moderate constipation.   potassium chloride (KLOR-CON) 10 MEQ tablet Take 1 tablet (10 mEq total) by mouth daily.   TRULICITY 0.75 MG/0.5ML SOPN INJECT 0.75 MG INTO THE SKIN ONCE A WEEK.   No facility-administered encounter medications on file as of 07/06/2021.    Patient Active Problem List   Diagnosis Date Noted   Nonobstructive transaminitis 06/28/2021   Decubitus ulcer of posterior thigh, stage 2 (HCC) 06/28/2021   Acute kidney injury (resolved) 06/28/2021   Pre-diabetes 06/10/2021   Obesity, Class III, BMI 40-49.9 (morbid obesity) (HCC) 06/10/2021   Osteoarthritis 06/10/2021   Pressure injury of skin 06/09/2021   Respiratory failure (HCC) 06/01/2021   Acute diastolic CHF (congestive heart failure) (HCC) 05/31/2021   CHF (congestive heart failure) (HCC) 05/31/2021   Hypoxia 05/31/2021   Gout, unspecified 03/10/2021   Edema 03/10/2021   Diabetes mellitus without complication (HCC) 03/10/2021   Muscle spasms  of both lower extremities 07/29/2020   Bedbound 05/22/2020   Unilateral osteoarthritis of hip, right 12/27/2019   Severe obstructive sleep apnea 02/22/2018   Hypertriglyceridemia 08/24/2017   Low HDL (under 40) 08/24/2017   Metabolic syndrome 08/24/2017   Morbid obesity (HCC) 10/14/2013   HTN (hypertension) 10/14/2013    Conditions to be addressed/monitored: HTN and  bedbound ; Limited social support, Transportation, Housing barriers, Limited access to caregiver, and Inability to perform ADL's independently  Care Plan : Counsellor of Plan for Management of Functional Limitations and Needs  Updates made by Michael Mam, LCSW since 07/07/2021 12:00 AM     Problem: Social and Functional Symptoms      Long-Range Goal: Optimize pt's quality of life and opportunities   Start Date: 01/25/2021  Expected End Date: 08/18/2021  This Visit's Progress: On track  Recent Progress: On track  Priority: High  Note:   Current barriers:   Transportation, Housing barriers, Level of care concerns, ADL IADL limitations, Social Isolation, Limited access to caregiver, Inability to perform ADL's independently, Inability to perform IADL's independently, and Lacks knowledge of community resource Clinical Goals: Patient will work with CSW  to address needs related to transportation, PCS/CAPS and other needs as assessed and identified Clinical Interventions:  07/06/21- Pt reports being released from hospital back to home where he has family assistance. Per pt, he is awaiting Home Health visits as well as his PCS care to be resumed. He states he is on the waiting list for CAPS.  Pt continues to seek weight loss surgery and states "I have a bunch of stuff to do".    CSW offered encouragement with all his goals.   date of comprehensive plan of care as evidenced by provider attestation and co-signature Inter-disciplinary care team collaboration (see longitudinal plan of care) Assessment of needs, barriers , agencies contacted, as well as how impacting  Review various resources, discussed options and provided patient information about Department of Social Services (food stamps, [employer], and utilities assistance), Transportation provided by insurance provider, Enhanced Benefits connected with insurance provider, Referral to care guide for community support and other options, SCAT  transportation, and [employer] Alternative Program CAP Patient interviewed and appropriate assessments performed Referred patient to community resources care guide team for assistance with ramps (building/ex[expense,etc)  Provided mental health counseling with regard to pt's current isolation and being homebound/bedbound Provided patient with information about SCAT, CAPS, transportation resources Discussed plans with patient for ongoing care management follow up and provided patient with direct contact information for care management team Advised patient to call DSS worker to request CAPS services Advised patient to complete PCS application/request for increase in hours provided Collaborated with primary care provider re: insurance prior auth process for stretcher transportation Assisted patient/caregiver with obtaining information about health plan benefits Other interventions provided:Solution-Focused Strategies, Active listening / Reflection utilized , Emotional Supportive Provided, Problem Solving /Task Center , Psychoeducation /Health Education, Motivational Interviewing, Brief CBT , Increase in activities / exercise encouraged (with HHPT), and Provided basic mental health support, education   Patient Goals/Self-Care Activities: Over the next 30 days  Follow Up Date 08/13/21   - keep 90 percent of counseling appointments - schedule counseling appointment  -follow up with PCS office re: staffing of hours, etc.  -follow up with Bariatric Coordinator for next steps  - begin a notebook of services in my neighborhood or community - call 211 when I need some help - follow-up on any referrals for help I am given - think  ahead to make sure my need does not become an emergency - make a note about what I need to have by the phone or take with me, like an identification card or social security number have a back-up plan - have a back-up plan - make a list of family or friends that I can call  -continue working with Home Health PT   -continue with  virtual  counseling - keep 90 percent of counseling appointments - schedule counseling appointment  -follow up with PCS office re: staffing of hours, etc.  -follow up with Bariatric Coordinator for next steps  - begin a notebook of services in my neighborhood or community - call 211 when I need some help - follow-up on any referrals for help I am given - think ahead to make sure my need does not become an emergency - make a note about what I need to have by the phone or take with me, like an identification card or social security number have a back-up plan - have a back-up plan - make a list of family or friends that I can call -continue working with Home Health PT      Follow Up Plan: Appointment scheduled for SW follow up with client by phone on: 08/13/21      Reece Levy MSW, LCSW Licensed Clinical Social Worker [facility] [phone]

## 2021-07-08 ENCOUNTER — Telehealth: Payer: Self-pay

## 2021-07-08 ENCOUNTER — Telehealth: Payer: Self-pay | Admitting: Family Medicine

## 2021-07-08 NOTE — Telephone Encounter (Signed)
Please give the order.  Thanks.   

## 2021-07-08 NOTE — Telephone Encounter (Signed)
NOTES SCANNED TO REFERRAL 

## 2021-07-08 NOTE — Telephone Encounter (Signed)
Moria with [facility] calling wanting to get verbal orders for patient, Occupational therapy once a week for 8 weeks. Call back [phone]

## 2021-07-09 DIAGNOSIS — J9621 Acute and chronic respiratory failure with hypoxia: Secondary | ICD-10-CM | POA: Diagnosis not present

## 2021-07-09 DIAGNOSIS — I5030 Unspecified diastolic (congestive) heart failure: Secondary | ICD-10-CM | POA: Diagnosis not present

## 2021-07-09 DIAGNOSIS — M109 Gout, unspecified: Secondary | ICD-10-CM | POA: Diagnosis not present

## 2021-07-09 DIAGNOSIS — M1611 Unilateral primary osteoarthritis, right hip: Secondary | ICD-10-CM | POA: Diagnosis not present

## 2021-07-09 DIAGNOSIS — D649 Anemia, unspecified: Secondary | ICD-10-CM | POA: Diagnosis not present

## 2021-07-09 DIAGNOSIS — E119 Type 2 diabetes mellitus without complications: Secondary | ICD-10-CM | POA: Diagnosis not present

## 2021-07-09 DIAGNOSIS — L89892 Pressure ulcer of other site, stage 2: Secondary | ICD-10-CM | POA: Diagnosis not present

## 2021-07-09 DIAGNOSIS — K219 Gastro-esophageal reflux disease without esophagitis: Secondary | ICD-10-CM | POA: Diagnosis not present

## 2021-07-09 DIAGNOSIS — I110 Hypertensive heart disease with heart failure: Secondary | ICD-10-CM | POA: Diagnosis not present

## 2021-07-09 DIAGNOSIS — J9622 Acute and chronic respiratory failure with hypercapnia: Secondary | ICD-10-CM | POA: Diagnosis not present

## 2021-07-09 NOTE — Telephone Encounter (Signed)
LMTCB

## 2021-07-09 NOTE — Telephone Encounter (Signed)
Tried to call Moria back about the OT orders; see other TE note.

## 2021-07-09 NOTE — Telephone Encounter (Signed)
Gar Gibbon with [facility] called stating that pt didn't need an ov she did it over the phone. Gar Gibbon stated that she also got pt Aid Services started back.

## 2021-07-12 ENCOUNTER — Telehealth: Payer: Self-pay | Admitting: Cardiovascular Disease

## 2021-07-12 ENCOUNTER — Telehealth: Payer: Self-pay | Admitting: Family Medicine

## 2021-07-12 DIAGNOSIS — J9622 Acute and chronic respiratory failure with hypercapnia: Secondary | ICD-10-CM | POA: Diagnosis not present

## 2021-07-12 DIAGNOSIS — L89892 Pressure ulcer of other site, stage 2: Secondary | ICD-10-CM | POA: Diagnosis not present

## 2021-07-12 DIAGNOSIS — J9621 Acute and chronic respiratory failure with hypoxia: Secondary | ICD-10-CM | POA: Diagnosis not present

## 2021-07-12 DIAGNOSIS — D649 Anemia, unspecified: Secondary | ICD-10-CM | POA: Diagnosis not present

## 2021-07-12 DIAGNOSIS — I110 Hypertensive heart disease with heart failure: Secondary | ICD-10-CM | POA: Diagnosis not present

## 2021-07-12 DIAGNOSIS — M109 Gout, unspecified: Secondary | ICD-10-CM | POA: Diagnosis not present

## 2021-07-12 DIAGNOSIS — I5030 Unspecified diastolic (congestive) heart failure: Secondary | ICD-10-CM | POA: Diagnosis not present

## 2021-07-12 DIAGNOSIS — K219 Gastro-esophageal reflux disease without esophagitis: Secondary | ICD-10-CM | POA: Diagnosis not present

## 2021-07-12 DIAGNOSIS — E119 Type 2 diabetes mellitus without complications: Secondary | ICD-10-CM | POA: Diagnosis not present

## 2021-07-12 NOTE — Assessment & Plan Note (Signed)
Due to weight and hip pain. With chronic pain.   Rotated his mattress.  That helped.  Continue Percocet at baseline.  Not sedated.

## 2021-07-12 NOTE — Telephone Encounter (Signed)
Left v/m requesting cb. 

## 2021-07-12 NOTE — Assessment & Plan Note (Signed)
  Status post vent dependent respiratory failure/CHF requiring significant diuresis, now extubated and at home. No fevers. Breathing is clearly better.  No sputum except for some throat clearing.  Off abx.  He is off O2 currently.  Continue BiPAP use at night. He has PCS pending through [employer].  We will see about getting his follow-up labs collected at home.  Continue amlodipine carvedilol and furosemide in the meantime.

## 2021-07-12 NOTE — Telephone Encounter (Signed)
Please see if he can have his home health/personal care agencies are able to collect labs.  We need to get a CMET and CBC done and he is likely not going to be able to come to clinic since he is home bound/bedbound.  Dx: I50.9

## 2021-07-12 NOTE — Telephone Encounter (Signed)
Moira from [facility] returned call . Would like a call back #[phone]

## 2021-07-12 NOTE — Assessment & Plan Note (Signed)
Continue BiPAP.  

## 2021-07-12 NOTE — Assessment & Plan Note (Signed)
  Needs bariatric f/u we talked about weight reduction strategies and specifically surgery/bariatric clinic follow-up.  He'll call about surgery clinic f/u.

## 2021-07-12 NOTE — Telephone Encounter (Signed)
Clearance placed on 10/19, see phone note.   Called patient based on referral placed to cardiology for preop clearance, I called the patient and he stated that I needed to call his PCP to set up appt and transportation. Patient has to be transmitted by ambulance. I called his PCP's office and the lady I spoke with had no idea about setting up transportation for patient. Not sure how to get patient an appt as he is home bound. Please advise.

## 2021-07-12 NOTE — Telephone Encounter (Signed)
Will see if SW can help arrange. Annice Pih - Can you help get this pt transportation to office visit at [facility]? Please route back to P CV DIV PREOP Tereso Newcomer, PA-C    07/12/2021 5:09 PM

## 2021-07-13 NOTE — Telephone Encounter (Signed)
Left a message for pt to call back.  Have scheduled him a new pt appointment with Dr. Nahser, 07/26/2021, at 8:40. Pt will need transportation arranged via ambulance.  I have the LCSW, Jackie Brennan, working on that as well.  If pt calls back, make him aware of his appointment and that someone will call him regarding transportation.  I will route back to the requesting surgeon's office to make them aware of appointment and that clearance will be addressed at that time. 

## 2021-07-13 NOTE — Telephone Encounter (Signed)
LMTCB for orders

## 2021-07-13 NOTE — Telephone Encounter (Signed)
Verbal orders have been given for OT.

## 2021-07-13 NOTE — Telephone Encounter (Signed)
See other TE note about orders.

## 2021-07-13 NOTE — Telephone Encounter (Signed)
Covering preop today. See prior clearance note from 10/19 - patient needs new patient OV as they have not been seen with our team since 2014. Tereso Newcomer has forwarded chart to social work to assist with transportation. Will otherwise route to callback to be in communication with social work team about transportation. Additional pre-op APP input not needed at this time since it has already been deemed that pt needs appt - will remove from pre-op APP box. Annice Pih, you may instead route your reply to P CV DIV PREOP CALLBACK. Thank you!

## 2021-07-13 NOTE — Telephone Encounter (Signed)
Left a message for pt to call back.  Have scheduled him a new pt appointment with Dr. Elease Hashimoto, 07/26/2021, at 8:40. Pt will need transportation arranged via ambulance.  I have the LCSW, Lasandra Beech, working on that as well.  If pt calls back, make him aware of his appointment and that someone will call him regarding transportation.  I will route back to the requesting surgeon's office to make them aware of appointment and that clearance will be addressed at that time.

## 2021-07-14 ENCOUNTER — Telehealth: Payer: Self-pay | Admitting: Family Medicine

## 2021-07-14 ENCOUNTER — Telehealth (HOSPITAL_COMMUNITY): Payer: Self-pay | Admitting: Licensed Clinical Social Worker

## 2021-07-14 DIAGNOSIS — G4733 Obstructive sleep apnea (adult) (pediatric): Secondary | ICD-10-CM | POA: Diagnosis not present

## 2021-07-14 DIAGNOSIS — R7303 Prediabetes: Secondary | ICD-10-CM | POA: Diagnosis not present

## 2021-07-14 DIAGNOSIS — I110 Hypertensive heart disease with heart failure: Secondary | ICD-10-CM | POA: Diagnosis not present

## 2021-07-14 DIAGNOSIS — J9690 Respiratory failure, unspecified, unspecified whether with hypoxia or hypercapnia: Secondary | ICD-10-CM | POA: Diagnosis not present

## 2021-07-14 DIAGNOSIS — J9621 Acute and chronic respiratory failure with hypoxia: Secondary | ICD-10-CM | POA: Diagnosis not present

## 2021-07-14 DIAGNOSIS — I5030 Unspecified diastolic (congestive) heart failure: Secondary | ICD-10-CM | POA: Diagnosis not present

## 2021-07-14 DIAGNOSIS — J9622 Acute and chronic respiratory failure with hypercapnia: Secondary | ICD-10-CM | POA: Diagnosis not present

## 2021-07-14 DIAGNOSIS — D649 Anemia, unspecified: Secondary | ICD-10-CM | POA: Diagnosis not present

## 2021-07-14 DIAGNOSIS — K219 Gastro-esophageal reflux disease without esophagitis: Secondary | ICD-10-CM | POA: Diagnosis not present

## 2021-07-14 DIAGNOSIS — L89892 Pressure ulcer of other site, stage 2: Secondary | ICD-10-CM | POA: Diagnosis not present

## 2021-07-14 DIAGNOSIS — M109 Gout, unspecified: Secondary | ICD-10-CM | POA: Diagnosis not present

## 2021-07-14 DIAGNOSIS — E119 Type 2 diabetes mellitus without complications: Secondary | ICD-10-CM | POA: Diagnosis not present

## 2021-07-14 NOTE — Telephone Encounter (Signed)
Type of forms received: benefit forms  Routed to: [employer] received by :  denishia   Individual made aware of 3-5 business day turn around (Y/N): yes  Form completed and patient made aware of charges(Y/N):   Faxed to :   Form location:

## 2021-07-14 NOTE — Telephone Encounter (Signed)
CSW received referral to assist patient with transportation to [facility] appointment. CSW contacted [employer], [place] office and [employer] [phone] and messages left for return call. CSW continues to follow. Lasandra Beech, LCSW, CCSW-MCS [phone]

## 2021-07-14 NOTE — Telephone Encounter (Signed)
I'll work on them when possible.  Thanks.  

## 2021-07-15 DIAGNOSIS — K219 Gastro-esophageal reflux disease without esophagitis: Secondary | ICD-10-CM | POA: Diagnosis not present

## 2021-07-15 DIAGNOSIS — I110 Hypertensive heart disease with heart failure: Secondary | ICD-10-CM | POA: Diagnosis not present

## 2021-07-15 DIAGNOSIS — L89892 Pressure ulcer of other site, stage 2: Secondary | ICD-10-CM | POA: Diagnosis not present

## 2021-07-15 DIAGNOSIS — I5030 Unspecified diastolic (congestive) heart failure: Secondary | ICD-10-CM | POA: Diagnosis not present

## 2021-07-15 DIAGNOSIS — D649 Anemia, unspecified: Secondary | ICD-10-CM | POA: Diagnosis not present

## 2021-07-15 DIAGNOSIS — E119 Type 2 diabetes mellitus without complications: Secondary | ICD-10-CM | POA: Diagnosis not present

## 2021-07-15 DIAGNOSIS — J9622 Acute and chronic respiratory failure with hypercapnia: Secondary | ICD-10-CM | POA: Diagnosis not present

## 2021-07-15 DIAGNOSIS — J9621 Acute and chronic respiratory failure with hypoxia: Secondary | ICD-10-CM | POA: Diagnosis not present

## 2021-07-15 DIAGNOSIS — M109 Gout, unspecified: Secondary | ICD-10-CM | POA: Diagnosis not present

## 2021-07-15 NOTE — Telephone Encounter (Signed)
Good afternoon Michael Payne, just confirming if you are able to see the appt for Michael Payne 07/23/21 @ 8:40 with Dr. Elease Hashimoto?   From [place]: Just wanted you to know that I am struggling to get transportation. No retrun calls or unable to accommodate. I am still trying.Marland KitchenMarland Kitchen

## 2021-07-16 ENCOUNTER — Telehealth: Payer: Self-pay | Admitting: Licensed Clinical Social Worker

## 2021-07-16 ENCOUNTER — Telehealth: Payer: Self-pay | Admitting: Pulmonary Disease

## 2021-07-16 DIAGNOSIS — D649 Anemia, unspecified: Secondary | ICD-10-CM | POA: Diagnosis not present

## 2021-07-16 DIAGNOSIS — I5030 Unspecified diastolic (congestive) heart failure: Secondary | ICD-10-CM | POA: Diagnosis not present

## 2021-07-16 DIAGNOSIS — J9621 Acute and chronic respiratory failure with hypoxia: Secondary | ICD-10-CM | POA: Diagnosis not present

## 2021-07-16 DIAGNOSIS — E119 Type 2 diabetes mellitus without complications: Secondary | ICD-10-CM | POA: Diagnosis not present

## 2021-07-16 DIAGNOSIS — K219 Gastro-esophageal reflux disease without esophagitis: Secondary | ICD-10-CM | POA: Diagnosis not present

## 2021-07-16 DIAGNOSIS — I110 Hypertensive heart disease with heart failure: Secondary | ICD-10-CM | POA: Diagnosis not present

## 2021-07-16 DIAGNOSIS — J9622 Acute and chronic respiratory failure with hypercapnia: Secondary | ICD-10-CM | POA: Diagnosis not present

## 2021-07-16 DIAGNOSIS — L89892 Pressure ulcer of other site, stage 2: Secondary | ICD-10-CM | POA: Diagnosis not present

## 2021-07-16 DIAGNOSIS — M109 Gout, unspecified: Secondary | ICD-10-CM | POA: Diagnosis not present

## 2021-07-16 NOTE — Telephone Encounter (Signed)
Received referral for patient stating "transportation by ambulance has to be approved by [facility]". Called [employer] to determine appropriate steps. I was given [employer]'s phone number to call to setup transportation: [phone].  [employer] told me transportation can only be set up 2 week prior to an appointment. Will call back 07/21/2021 to set up transportation for patient's 11/16 appointment at 10am with AO.

## 2021-07-16 NOTE — Telephone Encounter (Signed)
CSW received request to assist with transportation to appointment at [facility] on 07-26-21 at 8:40 am. CSW contacted [employer] which was listed in the chart for transport needs by [facility] [phone]. CSW shared the date and time of transport need and that patient would need stretcher transport as his wheelchair does not fit in a regular ambulance per patient. CSW informed that [employer] does not provide stretcher transport and that [employer] would need to be contacted. CSW contacted [phone] [facility] Member Transport services who also state that member does not have a stretcher transport benefit. Patient unsure who has provided transport in the past but states he has had transportation available to him in the past.    CSW will reach out to [facility] Care Management for further assistance. Patient will also explore past options and return call to CSW if obtains information. CSW will collaborate further with [facility]. Lasandra Beech, LCSW, CCSW-MCS [phone]

## 2021-07-16 NOTE — Telephone Encounter (Signed)
CSW received request to assist with transportation to appointment at [facility] on 07-26-21 at 8:40 am. CSW contacted [employer] which was listed in the chart for transport needs by [employer] [phone]. CSW shared the date and time of transport need and that patient would need stretcher transport as his wheelchair does not fit in a regular ambulance per patient. CSW informed that [employer] does not provide stretcher transport and that [employer] would need to be contacted. CSW contacted [phone] [employer] Member Transport services who also state that member does not have a stretcher transport benefit. Patient unsure who has provided transport in the past but states he has had transportation available to him in the past.    CSW will reach out to [facility] Care Management for further assistance. Patient will also explore past options and return call to CSW if obtains information. CSW will collaborate further with [facility]. Jackie Tedrick Port, LCSW, CCSW-MCS [phone] 

## 2021-07-18 DIAGNOSIS — R269 Unspecified abnormalities of gait and mobility: Secondary | ICD-10-CM | POA: Diagnosis not present

## 2021-07-18 DIAGNOSIS — M1631 Unilateral osteoarthritis resulting from hip dysplasia, right hip: Secondary | ICD-10-CM | POA: Diagnosis not present

## 2021-07-19 ENCOUNTER — Other Ambulatory Visit: Payer: Self-pay

## 2021-07-19 DIAGNOSIS — F339 Major depressive disorder, recurrent, unspecified: Secondary | ICD-10-CM

## 2021-07-19 DIAGNOSIS — J9621 Acute and chronic respiratory failure with hypoxia: Secondary | ICD-10-CM | POA: Diagnosis not present

## 2021-07-19 DIAGNOSIS — I5030 Unspecified diastolic (congestive) heart failure: Secondary | ICD-10-CM | POA: Diagnosis not present

## 2021-07-19 DIAGNOSIS — I110 Hypertensive heart disease with heart failure: Secondary | ICD-10-CM | POA: Diagnosis not present

## 2021-07-19 DIAGNOSIS — I10 Essential (primary) hypertension: Secondary | ICD-10-CM

## 2021-07-19 DIAGNOSIS — I5031 Acute diastolic (congestive) heart failure: Secondary | ICD-10-CM | POA: Diagnosis not present

## 2021-07-19 DIAGNOSIS — E119 Type 2 diabetes mellitus without complications: Secondary | ICD-10-CM | POA: Diagnosis not present

## 2021-07-19 DIAGNOSIS — M1611 Unilateral primary osteoarthritis, right hip: Secondary | ICD-10-CM | POA: Diagnosis not present

## 2021-07-19 DIAGNOSIS — D649 Anemia, unspecified: Secondary | ICD-10-CM | POA: Diagnosis not present

## 2021-07-19 DIAGNOSIS — M109 Gout, unspecified: Secondary | ICD-10-CM | POA: Diagnosis not present

## 2021-07-19 DIAGNOSIS — K219 Gastro-esophageal reflux disease without esophagitis: Secondary | ICD-10-CM | POA: Diagnosis not present

## 2021-07-19 DIAGNOSIS — L89892 Pressure ulcer of other site, stage 2: Secondary | ICD-10-CM | POA: Diagnosis not present

## 2021-07-19 DIAGNOSIS — Z789 Other specified health status: Secondary | ICD-10-CM

## 2021-07-19 DIAGNOSIS — J9622 Acute and chronic respiratory failure with hypercapnia: Secondary | ICD-10-CM | POA: Diagnosis not present

## 2021-07-19 NOTE — Telephone Encounter (Signed)
Called and spoke with both the patient and Home health ([facility]). Patient stated information for me to contact [facility] as his Home health provider. Called and spoke with Arlys John the clinical supervisor and he stated they have an appointment with the patient this Thursday and he just needs a letter stating what labs in a letter and reasoning with the providers signature. Please advise.

## 2021-07-19 NOTE — Telephone Encounter (Signed)
Michael Payne from [facility] wants to know if the labs are still wanted for the pt

## 2021-07-20 DIAGNOSIS — Z7401 Bed confinement status: Secondary | ICD-10-CM | POA: Diagnosis not present

## 2021-07-20 DIAGNOSIS — R32 Unspecified urinary incontinence: Secondary | ICD-10-CM | POA: Diagnosis not present

## 2021-07-20 DIAGNOSIS — G4733 Obstructive sleep apnea (adult) (pediatric): Secondary | ICD-10-CM | POA: Diagnosis not present

## 2021-07-20 NOTE — Telephone Encounter (Signed)
Called Arlys John at [facility] back; left him a message that we were faxing lab orders over now to fax number [phone]. Advsied if that is not correct to please call back.

## 2021-07-20 NOTE — Telephone Encounter (Signed)
Letter done. Please send. Thanks.

## 2021-07-21 ENCOUNTER — Telehealth (HOSPITAL_COMMUNITY): Payer: Self-pay | Admitting: Licensed Clinical Social Worker

## 2021-07-21 ENCOUNTER — Telehealth: Payer: Self-pay

## 2021-07-21 DIAGNOSIS — I10 Essential (primary) hypertension: Secondary | ICD-10-CM | POA: Diagnosis not present

## 2021-07-21 NOTE — Telephone Encounter (Signed)
CSW has made multiple attempts to find transportation for cardiac clearance appointment on Monday July 26, 2021 at [facility] office with no success. CSW has reached out to [facility] Care Management for further assistance with messages left.   CSW contacted patient to follow up regarding transportation to appointment on Monday. Patient reports "someone just called me from Dr Sallee Provencal office and told me that they got insurance approval for transportation and it is all set up". CSW inquired about who was the person who contacted him and he was not aware. CSW unable to confirm transportation at this time. Will await return call from [facility] office and message left for [employer], LCSW at [facility]. Lasandra Beech, LCSW, CCSW-MCS [phone]

## 2021-07-21 NOTE — Telephone Encounter (Signed)
Left voicemail for patient to call back to discuss ambulance transportation. Transportation company [employer] would like to know if anyone will be coming with the patient to the appointment, if he will bring a cell phone that can receive text messages, and if he will be using a wheelchair, walker, etc.

## 2021-07-21 NOTE — Telephone Encounter (Signed)
S/w Dennis Bast, RN Clinic Supervisor who also s/w Lasandra Beech in regard to getting pt to appt 07/26/21. Per Dennis Bast, RN Clinic Supervisor she will update the clearance notes from her conversation with Lasandra Beech, CSW.

## 2021-07-21 NOTE — Telephone Encounter (Signed)
   Telephone encounter was:  Successful.  07/21/2021 Name: Michael Payne MRN: [id number] DOB: 1970-10-10  Michael Payne is a 50 y.o. year old male who is a primary care patient of Joaquim Nam, MD . The community resource team was consulted for assistance with Transportation Needs   Care guide performed the following interventions:  Pt advised he has not received weight loss surgery just yet. Therefore, he will need stretcher transportation as he is bedbound. He does not need any oxygen and no one will be riding. He has used [employer] before.  Follow Up Plan:  Care guide will follow up with patient by phone over the next few days.  [facility] Guide, Embedded Care Coordination [facility]  [place], [place] [address]  Main Phone: [phone]  E-mail: [email or web address]  Website: [email or web address]

## 2021-07-22 ENCOUNTER — Ambulatory Visit (INDEPENDENT_AMBULATORY_CARE_PROVIDER_SITE_OTHER): Payer: Medicare HMO | Admitting: *Deleted

## 2021-07-22 ENCOUNTER — Telehealth: Payer: Self-pay | Admitting: Family Medicine

## 2021-07-22 ENCOUNTER — Telehealth (HOSPITAL_COMMUNITY): Payer: Self-pay | Admitting: Licensed Clinical Social Worker

## 2021-07-22 DIAGNOSIS — I10 Essential (primary) hypertension: Secondary | ICD-10-CM

## 2021-07-22 DIAGNOSIS — M109 Gout, unspecified: Secondary | ICD-10-CM | POA: Diagnosis not present

## 2021-07-22 DIAGNOSIS — K219 Gastro-esophageal reflux disease without esophagitis: Secondary | ICD-10-CM | POA: Diagnosis not present

## 2021-07-22 DIAGNOSIS — J9621 Acute and chronic respiratory failure with hypoxia: Secondary | ICD-10-CM | POA: Diagnosis not present

## 2021-07-22 DIAGNOSIS — D649 Anemia, unspecified: Secondary | ICD-10-CM | POA: Diagnosis not present

## 2021-07-22 DIAGNOSIS — I110 Hypertensive heart disease with heart failure: Secondary | ICD-10-CM | POA: Diagnosis not present

## 2021-07-22 DIAGNOSIS — E119 Type 2 diabetes mellitus without complications: Secondary | ICD-10-CM

## 2021-07-22 DIAGNOSIS — L89892 Pressure ulcer of other site, stage 2: Secondary | ICD-10-CM | POA: Diagnosis not present

## 2021-07-22 DIAGNOSIS — I5030 Unspecified diastolic (congestive) heart failure: Secondary | ICD-10-CM | POA: Diagnosis not present

## 2021-07-22 DIAGNOSIS — I5031 Acute diastolic (congestive) heart failure: Secondary | ICD-10-CM

## 2021-07-22 DIAGNOSIS — G4733 Obstructive sleep apnea (adult) (pediatric): Secondary | ICD-10-CM

## 2021-07-22 DIAGNOSIS — J9622 Acute and chronic respiratory failure with hypercapnia: Secondary | ICD-10-CM | POA: Diagnosis not present

## 2021-07-22 NOTE — Chronic Care Management (AMB) (Signed)
Chronic Care Management    Clinical Social Work Note  07/22/2021 Name: Michael Payne MRN: [id number] DOB: 1971/02/07  Michael Payne is a 50 y.o. year old male who is a primary care patient of Joaquim Nam, MD. The CCM team was consulted to assist the patient with chronic disease management and/or care coordination needs related to: Transportation Needs .   Collaboration with Lasandra Beech, LCSW/Heart clinic  for transportation needs. follow up visit in response to provider referral for social work chronic care management and care coordination services.    Patient agreed to services and consent obtained.   Assessment: Review of patient past medical history, allergies, medications, and health status, including review of relevant consultants reports was performed today as part of a comprehensive evaluation and provision of chronic care management and care coordination services.     SDOH (Social Determinants of Health) assessments and interventions performed:    Advanced Directives Status: Not addressed in this encounter.  CCM Care Plan  Allergies  Allergen Reactions   Aspirin Other (See Comments)    Upset stomach   Lactose Intolerance (Gi) Nausea And Vomiting   Lisinopril     Causes cough    Outpatient Encounter Medications as of 07/22/2021  Medication Sig   acetaminophen (TYLENOL) 325 MG tablet Take 2 tablets (650 mg total) by mouth every 6 (six) hours as needed for headache or mild pain.   allopurinol (ZYLOPRIM) 100 MG tablet Take 1 tablet (100 mg total) by mouth daily.   amLODipine (NORVASC) 10 MG tablet Take 1 tablet (10 mg total) by mouth daily.   carvedilol (COREG) 3.125 MG tablet Take 1 tablet (3.125 mg total) by mouth 2 (two) times daily with a meal.   colchicine (COLCRYS) 0.6 MG tablet Take 1 tablet (0.6 mg total) by mouth daily as needed.   diclofenac Sodium (VOLTAREN) 1 % GEL Apply 4 g topically 4 (four) times daily.   furosemide (LASIX) 40 MG tablet Take 1 tablet  (40 mg total) by mouth daily.   naloxone (NARCAN) nasal spray 4 mg/0.1 mL Spray nostril if needed for opioid overuse- decreased consciousness, decreased respirations   oxyCODONE-acetaminophen (PERCOCET) 10-325 MG tablet Take 1 tablet by mouth every 6 (six) hours as needed for pain.   polyethylene glycol (MIRALAX / GLYCOLAX) 17 g packet Take 17 g by mouth daily as needed for moderate constipation.   potassium chloride (KLOR-CON) 10 MEQ tablet Take 1 tablet (10 mEq total) by mouth daily.   TRULICITY 0.75 MG/0.5ML SOPN INJECT 0.75 MG INTO THE SKIN ONCE A WEEK.   No facility-administered encounter medications on file as of 07/22/2021.    Patient Active Problem List   Diagnosis Date Noted   Nonobstructive transaminitis 06/28/2021   Decubitus ulcer of posterior thigh, stage 2 (HCC) 06/28/2021   Acute kidney injury (resolved) 06/28/2021   Pre-diabetes 06/10/2021   Obesity, Class III, BMI 40-49.9 (morbid obesity) (HCC) 06/10/2021   Osteoarthritis 06/10/2021   Pressure injury of skin 06/09/2021   Respiratory failure (HCC) 06/01/2021   Acute diastolic CHF (congestive heart failure) (HCC) 05/31/2021   CHF (congestive heart failure) (HCC) 05/31/2021   Hypoxia 05/31/2021   Gout, unspecified 03/10/2021   Edema 03/10/2021   Diabetes mellitus without complication (HCC) 03/10/2021   Muscle spasms of both lower extremities 07/29/2020   Bedbound 05/22/2020   Unilateral osteoarthritis of hip, right 12/27/2019   Severe obstructive sleep apnea 02/22/2018   Hypertriglyceridemia 08/24/2017   Low HDL (under 40) 08/24/2017   Metabolic syndrome  08/24/2017   Morbid obesity (HCC) 10/14/2013   HTN (hypertension) 10/14/2013    Conditions to be addressed/monitored:  transportation needs ;  bedbound  Care Plan : Development of Plan for Management of Functional Limitations and Needs  Updates made by Buck Mam, LCSW since 07/22/2021 12:00 AM     Problem: Social and Functional Symptoms       Long-Range Goal: Optimize pt's quality of life and opportunities   Start Date: 01/25/2021  Expected End Date: 08/18/2021  This Visit's Progress: On track  Recent Progress: On track  Priority: High  Note:   Current barriers:   Transportation, Housing barriers, Level of care concerns, ADL IADL limitations, Social Isolation, Limited access to caregiver, Inability to perform ADL's independently, Inability to perform IADL's independently, and Lacks knowledge of community resource Clinical Goals: Patient will work with CSW  to address needs related to transportation, PCS/CAPS and other needs as assessed and identified Clinical Interventions:  07/21/21: Received call from Lasandra Beech, LCSW/Heart Care, indicating pt was in need of transportation to see a Cardiologist for surgical clearance. CSW advised and provided the steps to seek insurance waiver/auth for this.   07/06/21- Pt reports being released from hospital back to home where he has family assistance. Per pt, he is awaiting Home Health visits as well as his PCS care to be resumed. He states he is on the waiting list for CAPS.  Pt continues to seek weight loss surgery and states "I have a bunch of stuff to do".    CSW offered encouragement with all his goals.   date of comprehensive plan of care as evidenced by provider attestation and co-signature Inter-disciplinary care team collaboration (see longitudinal plan of care) Assessment of needs, barriers , agencies contacted, as well as how impacting  Review various resources, discussed options and provided patient information about Department of Social Services (food stamps, [employer], and utilities assistance), Transportation provided by insurance provider, Enhanced Benefits connected with insurance provider, Referral to care guide for community support and other options, SCAT transportation, and [employer] Alternative Program CAP Patient interviewed and appropriate assessments  performed Referred patient to community resources care guide team for assistance with ramps (building/ex[expense,etc)  Provided mental health counseling with regard to pt's current isolation and being homebound/bedbound Provided patient with information about SCAT, CAPS, transportation resources Discussed plans with patient for ongoing care management follow up and provided patient with direct contact information for care management team Advised patient to call DSS worker to request CAPS services Advised patient to complete PCS application/request for increase in hours provided Collaborated with primary care provider re: insurance prior auth process for stretcher transportation Assisted patient/caregiver with obtaining information about health plan benefits Other interventions provided:Solution-Focused Strategies, Active listening / Reflection utilized , Emotional Supportive Provided, Problem Solving /Task Center , Psychoeducation /Health Education, Motivational Interviewing, Brief CBT , Increase in activities / exercise encouraged (with HHPT), and Provided basic mental health support, education   Patient Goals/Self-Care Activities: Over the next 30 days  Follow Up Date 08/13/21   - keep 90 percent of counseling appointments - schedule counseling appointment  -follow up with PCS office re: staffing of hours, etc.  -follow up with Bariatric Coordinator for next steps  - begin a notebook of services in my neighborhood or community - call 211 when I need some help - follow-up on any referrals for help I am given - think ahead to make sure my need does not become an emergency - make a note about what  I need to have by the phone or take with me, like an identification card or social security number have a back-up plan - have a back-up plan - make a list of family or friends that I can call -continue working with Home Health PT   -continue with  virtual  counseling - keep 90 percent of counseling  appointments - schedule counseling appointment  -follow up with PCS office re: staffing of hours, etc.  -follow up with Bariatric Coordinator for next steps  - begin a notebook of services in my neighborhood or community - call 211 when I need some help - follow-up on any referrals for help I am given - think ahead to make sure my need does not become an emergency - make a note about what I need to have by the phone or take with me, like an identification card or social security number have a back-up plan - have a back-up plan - make a list of family or friends that I can call -continue working with Home Health PT      Follow Up Plan: SW will follow up with patient by phone over the next 30 days Reece Levy MSW, LCSW Licensed Clinical Social Worker [facility] [place] [phone]

## 2021-07-22 NOTE — Telephone Encounter (Signed)
Kelly from [facility] has called to inform us that the patient got a referral for their office but has not been seen yet. [facility] is requesting some information to be faxed over to them for an upcoming procedure. They are not able to fax over the information because they have not seen the patient.  Tresa Endo is open to helping with any questions- [phone] ( okay to leave a message )   Fax number for place: [phone] Clinical Review- Case Number [id number] [facility] needing Cardiac Clearance  Include any supporting documents I.e. office notes to show the patient Is bed bound and has to travel by ambulance to his appointments  Also include member ID - Physician: Dr.P. Nahser NPI- [id number] TAX ID: [id number]

## 2021-07-22 NOTE — Telephone Encounter (Signed)
CSW spoke with Reece Levy, LCSW from CCM who shared process to obtain waiver exception from insurance from insurance company. CSW collaborated with Dennis Bast, RN Supervisor at [employer] is assisting with process to [employer]. The request is in process and Dennis Bast, RN will update CSW and patient as needed for further intervention. Lasandra Beech, LCSW, CCSW-MCS [phone]

## 2021-07-22 NOTE — Telephone Encounter (Signed)
Patient is needing cardiac clearance (see 10/19 phone note) for Robotic Sleeve Gastrectomy with Dr. Grace Blight at [facility]: Phone number:  [phone] Fax number:  [phone].  He has never been seen at our office and is schedule as a new patient on 11/7 with Dr. Elease Hashimoto as a new patient for surgical clearance.  It was brought to our attention that he is bed bound and would need transportation from [facility] to come to the appointment.  I have called and spent 40 min on the phone trying to get a network waiver for transportation.  I was asked to fax over supporting documents for this request.  As we have never seen the patient I am not able to fulfill this request and am asking for PCP to send over. The case # is [id number].  Documents that need to be sent are anything that supports his need for transportation.  The surgeon is seeking cardiac clearance for procedure.  I was told that he had used the [facility] service in the past for a PCP appointment.  Is this something you all can submit? As this may take some time feel we may need to move his appointment out in order to get him here.

## 2021-07-22 NOTE — Patient Instructions (Signed)
Visit Information  The patient verbalized understanding of instructions, educational materials, and care plan provided today and declined offer to receive copy of patient instructions, educational materials, and care plan.   Telephone follow up appointment with care management team member scheduled for: 3-4 weeks  Reece Levy MSW, LCSW Licensed Clinical Social Worker [facility] [phone]

## 2021-07-23 ENCOUNTER — Telehealth: Payer: Self-pay | Admitting: Family Medicine

## 2021-07-23 ENCOUNTER — Telehealth: Payer: Self-pay

## 2021-07-23 DIAGNOSIS — I10 Essential (primary) hypertension: Secondary | ICD-10-CM | POA: Diagnosis not present

## 2021-07-23 NOTE — Telephone Encounter (Signed)
Pt has appt 07/26/21 with Dr. Elease Hashimoto. Will forward notes to MD for upcoming. Will send FYI to surgeon's office pt has appt, see notes.

## 2021-07-23 NOTE — Telephone Encounter (Signed)
[facility] called and gave a PA number for transport #[phone].  Called [facility] to arrange and patient was already scheduled for transportation for appointment.  Transportation transferred me to billing and the PA number was given.  Dr. Elease Hashimoto aware of appointment for Monday.

## 2021-07-23 NOTE — Telephone Encounter (Signed)
Thank you for working on this and getting it arranged.  Is it possible to get blood drawn at the cardiology visit?  We couldn't get that set up at home.  He needs a f/u CBC and CMET.  Please let me know.  Thanks.

## 2021-07-23 NOTE — Telephone Encounter (Signed)
Yes we will be glad to get labs

## 2021-07-23 NOTE — Telephone Encounter (Signed)
Notes from Dennis Bast, RN Clinic Supervisor:  Carmell [place] called and gave a PA number for transport #[phone].  Called [facility] to arrange and patient was already scheduled for transportation for appointment.  Transportation transferred me to billing and the PA number was given.  Dr. Elease Hashimoto aware of appointment for Monday.

## 2021-07-23 NOTE — Telephone Encounter (Signed)
Called and spoke with Arlys John at [facility] about the discontinuation of aid services. Arlys John stated that patient has [facility] Kobus hands coming in 7 days a week for aid services so they will no longer be going. I also inquired about the lab work that was to be done yesterday at home visit. He stated that someone should have called [place] about that yesterday; he was told that blood sample could not be obtained due to patient having deep veins. Vein could not be seen or palpated yesterday.   I did see that patient has an appt with Cardiology 07/26/21; not sure if you want to reach out to them to see if maybe they could get the labs your requested?

## 2021-07-23 NOTE — Telephone Encounter (Signed)
Brain Chestine Spore with [facility] called stating that they have discontinue Aid Services for pt.

## 2021-07-23 NOTE — Telephone Encounter (Signed)
   Telephone encounter was:  Unsuccessful.  07/23/2021 Name: Michael Payne MRN: [id number] DOB: [birth year]  Unsuccessful outbound call made today to assist with:  Transportation Needs   Outreach Attempt:  1st Attempt  A HIPAA compliant voice message was left requesting a return call.  Instructed patient to call back at [phone] at their earliest convenience .  [facility] Guide, Embedded Care Coordination [facility]  [place], [place] [address]  Main Phone: [phone]  E-mail: [email or web address]  Website: [email or web address]

## 2021-07-25 NOTE — Addendum Note (Signed)
Addended by: Joaquim Nam on: 07/25/2021 09:20 PM   Modules accepted: Orders

## 2021-07-25 NOTE — Telephone Encounter (Signed)
I put in an order for CBC and CMET.  I appreciate you getting these done.

## 2021-07-25 NOTE — Telephone Encounter (Signed)
Thanks for checking on this.  I did communicate with cardiology about getting his labs done at the upcoming appointment.  Thanks.

## 2021-07-25 NOTE — Progress Notes (Signed)
Cardiology Office Note:    Date:  07/26/2021   ID:  Michael [facility], DOB 21-Apr-1971, MRN [id number]  PCP:  Joaquim Nam, MD   [facility] Providers Cardiologist:  Eugenia Pancoast to update primary MD,subspecialty MD or APP then REFRESH:1}    Referring MD: Joaquim Nam, MD   Chief Complaint  Patient presents with   Pre-op Exam   morbid obesity    Nov. 7, 2022   Michael [facility] is a 50 y.o. male with a hx of morbid obesity. We were asked to see him by Dr. Para March for pre-op evaluation of his CV risk prior to bariatric surgery .   Bed bound .  Unable to walk. Examined on the EMS gurney .  Recently admitted to [facility] with acute respiratory failure - hypercarbid respiratory failure  Was eating a high salt diet every meal  Was diuresed 26 liters  Dx with OSA, now on BIPAP  Echo on Sept. 13, showed normal LV systolic function 60-65%. 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 Last walked 2 years ago    Wt Readings from Last 3 Encounters:  06/18/21 (!) 595 lb 3.9 oz (270 kg)  11/05/20 (!) 490 lb (222.3 kg)  09/02/20 (!) 490 lb (222.3 kg)     Has cut back on his diet     Past Medical History:  Diagnosis Date   Anemia    borderline anemia, was instructed to eat more iron filled foods   GERD (gastroesophageal reflux disease)    lactose intolerance   Hip pain    HTN (hypertension)    Obesity    Sleep apnea     Past Surgical History:  Procedure Laterality Date   FOOT SURGERY     KNEE ARTHROSCOPY WITH MEDIAL MENISECTOMY Left 12/16/2014   Procedure: KNEE ARTHROSCOPY WITH MEDIAL MENISECTOMY;  Surgeon: Sheral Apley, MD;  Location: [facility] OR;  Service: Orthopedics;  Laterality: Left;    Current Medications: Current Meds  Medication Sig   acetaminophen (TYLENOL) 325 MG tablet Take 2 tablets (650 mg total) by mouth every 6 (six) hours as  needed for headache or mild pain.   allopurinol (ZYLOPRIM) 100 MG tablet Take 1 tablet (100 mg total) by mouth daily.   amLODipine (NORVASC) 10 MG tablet Take 1 tablet (10 mg total) by mouth daily.   carvedilol (COREG) 3.125 MG tablet Take 1 tablet (3.125 mg total) by mouth 2 (two) times daily with a meal.   colchicine (COLCRYS) 0.6 MG tablet Take 1 tablet (0.6 mg total) by mouth daily as needed.   furosemide (LASIX) 40 MG tablet Take 1 tablet (40 mg total) by mouth daily.   naloxone (NARCAN) nasal spray 4 mg/0.1 mL Spray nostril if needed for opioid overuse- decreased consciousness, decreased respirations   oxyCODONE-acetaminophen (PERCOCET) 10-325 MG tablet Take 1 tablet by mouth every 6 (six) hours as needed for pain.   potassium chloride (KLOR-CON) 10 MEQ tablet Take 1 tablet (10 mEq total) by mouth daily.   TRULICITY 0.75 MG/0.5ML SOPN INJECT 0.75 MG INTO THE SKIN ONCE A WEEK.     Allergies:   Aspirin, Lactose intolerance (gi), and Lisinopril   Social History   Socioeconomic History   Marital status: Married    Spouse name: Not on file   Number of children: 2   Years  of education: Not on file   Highest education level: Not on file  Occupational History   Occupation: disabled  Tobacco Use   Smoking status: Former    Packs/day: 2.00    Years: 22.00    Pack years: 44.00    Types: Cigarettes    Quit date: 02/24/2011    Years since quitting: 10.4   Smokeless tobacco: Never  Vaping Use   Vaping Use: Never used  Substance and Sexual Activity   Alcohol use: Not Currently    Comment: Weekends/beer-- has slowed down  alot   Drug use: Not Currently    Types: Marijuana    Comment: former -  quit in 2012   Sexual activity: Yes    Partners: Female  Other Topics Concern   Not on file  Social History Narrative   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   Former Hydrologist.   Married.   Social Determinants of Health   Financial Resource Strain: Low Risk     Difficulty of Paying Living Expenses: Not hard at all  Food Insecurity: No Food Insecurity   Worried About Programme researcher, broadcasting/film/video in the Last Year: Never true   Barista in the Last Year: Never true  Transportation Needs: Unmet Transportation Needs   Lack of Transportation (Medical): Yes   Lack of Transportation (Non-Medical): Yes  Physical Activity: Inactive   Days of Exercise per Week: 0 days   Minutes of Exercise per Session: 0 min  Stress: Stress Concern Present   Feeling of Stress : To some extent  Social Connections: Unknown   Frequency of Communication with Friends and Family: Twice a week   Frequency of Social Gatherings with Friends and Family: Once a week   Attends Religious Services: Never   Database administrator or Organizations: No   Attends Engineer, structural: Never   Marital Status: Not on file     Family History: The patient's family history includes CAD in his maternal grandfather; Cancer in his father; Heart attack (age of onset: 42) in his mother; Lung cancer in his father.  ROS:   Please see the history of present illness.     All other systems reviewed and are negative.  EKGs/Labs/Other Studies Reviewed:    The following studies were reviewed today:   EKG:  Nov. 7, 2022:  NSR at 95.  No ST or T wave changes.   Recent Labs: 03/09/2021: Pro B Natriuretic peptide (BNP) 23.0; TSH 4.33 05/31/2021: B Natriuretic Peptide 231.8 06/11/2021: Magnesium 2.4 06/23/2021: ALT 165; BUN 21; Creatinine, Ser 0.97; Hemoglobin 12.5; Platelets 234; Potassium 4.0; Sodium 136  Recent Lipid Panel    Component Value Date/Time   CHOL 165 03/09/2021 1232   TRIG 129.0 03/09/2021 1232   HDL 38.50 (L) 03/09/2021 1232   CHOLHDL 4 03/09/2021 1232   VLDL 25.8 03/09/2021 1232   LDLCALC 101 (H) 03/09/2021 1232   LDLDIRECT 76.0 10/29/2018 1249     Risk Assessment/Calculations:           Physical Exam:    VS:  BP (!) 148/95   Pulse 95   Ht 6\' 4"  (1.93 m)    SpO2 95%   BMI 72.46 kg/m     Wt Readings from Last 3 Encounters:  06/18/21 (!) 595 lb 3.9 oz (270 kg)  11/05/20 (!) 490 lb (222.3 kg)  09/02/20 (!) 490 lb (222.3 kg)     GEN:  morbidly obese male,  examined on the gurney  HEENT: Normal NECK: No JVD; No carotid bruits LYMPHATICS: No lymphadenopathy CARDIAC: RR , very distant heart sounds  RESPIRATORY:  clear anterily  ABDOMEN: Soft, non-tender, non-distended MUSCULOSKELETAL:  No edema; No deformity  SKIN: Warm and dry NEUROLOGIC:  Alert and oriented x 3 PSYCHIATRIC:  Normal affect   ASSESSMENT:    1. Acute hypercapnic respiratory failure (HCC)   2. Hypertension, unspecified type   3. Essential hypertension    PLAN:    In order of problems listed above:   Acute hypercarbic respiratory failure: Addam was admitted to the hospital in September with acute hypercarbic respiratory failure.  He was massively volume overloaded.  Echocardiogram revealed normal systolic and normal diastolic function.  The echo was technically difficult because of his morbid obesity.  He was diuresed over 26 L.  He was previously eating a very high salt and unrestricted diet for each meal.  He was diagnosed with obstructive sleep apnea and is now wearing BiPAP.  He is currently able to lie flat without any significant problems.  We we are asked to provide preoperative evaluation prior to bariatric surgery. Were not able to perform any additional stress testing or functional testing because of his morbid obesity.  His echocardiogram in the hospital several months ago revealed normal LV systolic and diastolic function.  Clinically he is quite a bit better.  He is able to lie flat without any significant problems.  He is completely bedbound for the past 2 years and has not been able to walk at all.  He has no ability to exercise.  Given his inability and his excessive weight, I would still would classify him at moderate risk for cardiovascular  complications.  He certainly has no signs of volume overload at this time.  Our team will be available to see him in the hospital for consultation if needed.  He will need to continue with his salt restricted diet and weight loss program.  We will see him back on an as-needed basis.   2.  Essential hypertension: His blood pressure is fairly well controlled.  3.  Morbid obesity:  going for bariatriac surgery  There is no way to really test him for ischemia given his morbid obesity. He would be at moderate - high risk for anesthesia given his size but there are no further tests we could perform that would allow [place] to reduce that risk .      Medication Adjustments/Labs and Tests Ordered: Current medicines are reviewed at length with the patient today.  Concerns regarding medicines are outlined above.  Orders Placed This Encounter  Procedures   Basic metabolic panel   CBC   EKG 12-Lead    No orders of the defined types were placed in this encounter.   Patient Instructions  Medication Instructions:  Your physician recommends that you continue on your current medications as directed. Please refer to the Current Medication list given to you today.  *If you need a refill on your cardiac medications before your next appointment, please call your pharmacy*   Lab Work: TODAY: CBC, BMP If you have labs (blood work) drawn today and your tests are completely normal, you will receive your results only by: MyChart Message (if you have MyChart) OR A paper copy in the mail If you have any lab test that is abnormal or we need to change your treatment, we will call you to review the results.   Testing/Procedures: NONE   Follow-Up: As needed  At [facility], you and your health needs are our priority.  As part of our continuing mission to provide you with exceptional heart care, we have created designated Provider Care Teams.  These Care Teams include your primary Cardiologist  (physician) and Advanced Practice Providers (APPs -  Physician Assistants and Nurse Practitioners) who all work together to provide you with the care you need, when you need it.      Signed, [name], MD  07/26/2021 9:43 AM    [facility]

## 2021-07-26 ENCOUNTER — Ambulatory Visit (INDEPENDENT_AMBULATORY_CARE_PROVIDER_SITE_OTHER): Payer: Medicare HMO | Admitting: Cardiovascular Disease

## 2021-07-26 ENCOUNTER — Encounter: Payer: Self-pay | Admitting: Cardiovascular Disease

## 2021-07-26 ENCOUNTER — Telehealth: Payer: Self-pay

## 2021-07-26 VITALS — BP 148/95 | HR 95 | Ht 76.0 in

## 2021-07-26 DIAGNOSIS — Z7401 Bed confinement status: Secondary | ICD-10-CM | POA: Diagnosis not present

## 2021-07-26 DIAGNOSIS — J9602 Acute respiratory failure with hypercapnia: Secondary | ICD-10-CM | POA: Diagnosis not present

## 2021-07-26 DIAGNOSIS — E662 Morbid (severe) obesity with alveolar hypoventilation: Secondary | ICD-10-CM | POA: Diagnosis not present

## 2021-07-26 DIAGNOSIS — Z01810 Encounter for preprocedural cardiovascular examination: Secondary | ICD-10-CM

## 2021-07-26 DIAGNOSIS — M2550 Pain in unspecified joint: Secondary | ICD-10-CM | POA: Diagnosis not present

## 2021-07-26 DIAGNOSIS — I10 Essential (primary) hypertension: Secondary | ICD-10-CM

## 2021-07-26 DIAGNOSIS — R5381 Other malaise: Secondary | ICD-10-CM | POA: Diagnosis not present

## 2021-07-26 DIAGNOSIS — J9610 Chronic respiratory failure, unspecified whether with hypoxia or hypercapnia: Secondary | ICD-10-CM | POA: Diagnosis not present

## 2021-07-26 LAB — BASIC METABOLIC PANEL
BUN/Creatinine Ratio: 19 (ref 9–20)
BUN: 12 mg/dL (ref 6–24)
CO2: 24 mmol/L (ref 20–29)
Calcium: 9.2 mg/dL (ref 8.7–10.2)
Chloride: 103 mmol/L (ref 96–106)
Creatinine, Ser: 0.63 mg/dL — ABNORMAL LOW (ref 0.76–1.27)
Glucose: 101 mg/dL — ABNORMAL HIGH (ref 70–99)
Potassium: 4.3 mmol/L (ref 3.5–5.2)
Sodium: 140 mmol/L (ref 134–144)
eGFR: 117 mL/min/{1.73_m2} (ref >59)

## 2021-07-26 LAB — CBC
Hematocrit: 42.4 % (ref 37.5–51.0)
Hemoglobin: 13.4 g/dL (ref 13.0–17.7)
MCH: 28.1 pg (ref 26.6–33.0)
MCHC: 31.6 g/dL (ref 31.5–35.7)
MCV: 89 fL (ref 79–97)
Platelets: 261 10*3/uL (ref 150–450)
RBC: 4.77 x10E6/uL (ref 4.14–5.80)
RDW: 15.9 % — ABNORMAL HIGH (ref 11.6–15.4)
WBC: 7.7 10*3/uL (ref 3.4–10.8)

## 2021-07-26 NOTE — Telephone Encounter (Signed)
  Michael [facility] is a 50 year old gentleman with morbid obesity.  His weight is close to 600 pounds at last measurement.  He was recently admitted to the hospital with acute hypercarbic respiratory failure. Echocardiogram reveals normal left ventricular systolic and diastolic function.  He he was massively volume overloaded and was diuresed over 26 L throughout that hospitalization.  He has corrected his diet.  He is no longer eating high salt foods.  He has been wearing his BiPAP at night. He is feeling quite a bit better and is able to lie flat without any difficulty.  He is bedbound and we have no ability to exercise him.  We have no way to do any functional testing. Clinically he is clearly better than when he was hospitalized several months ago.  He is EKG shows no acute abnormalities.  Given his inability and his excessive weight, I would  would classify him at moderate risk for cardiovascular complications.  He certainly has no signs of volume overload at this time.    [name], MD  07/26/2021 9:45 AM    [facility] [address] [place],  [address] [place], [place]  [address] Phone: [phone]; Fax: [phone]

## 2021-07-26 NOTE — Patient Instructions (Signed)
Medication Instructions:  Your physician recommends that you continue on your current medications as directed. Please refer to the Current Medication list given to you today.  *If you need a refill on your cardiac medications before your next appointment, please call your pharmacy*   Lab Work: TODAY: CBC, BMP If you have labs (blood work) drawn today and your tests are completely normal, you will receive your results only by: MyChart Message (if you have MyChart) OR A paper copy in the mail If you have any lab test that is abnormal or we need to change your treatment, we will call you to review the results.   Testing/Procedures: NONE   Follow-Up: As needed  At [facility], you and your health needs are our priority.  As part of our continuing mission to provide you with exceptional heart care, we have created designated Provider Care Teams.  These Care Teams include your primary Cardiologist (physician) and Advanced Practice Providers (APPs -  Physician Assistants and Nurse Practitioners) who all work together to provide you with the care you need, when you need it.

## 2021-07-26 NOTE — Telephone Encounter (Signed)
   Telephone encounter was:  Successful.  07/26/2021 Name: [facility] MRN: [id number] DOB: [birth year]  [facility] is a 50 y.o. year old male who is a primary care patient of Joaquim Nam, MD . The community resource team was consulted for assistance with Transportation Needs   Care guide performed the following interventions:  Patient advised [facility] did in fact pick him up this morning for his transportation needs. Until pt receives weight loss surgery. [facility] will assist on his trips.  Follow Up Plan:  No further follow up planned at this time. The patient has been provided with needed resources.  [facility] Guide, Embedded Care Coordination [facility]  [place], [place] [address]  Main Phone: [phone]  E-mail: [email or web address]  Website: [email or web address]

## 2021-07-26 NOTE — Telephone Encounter (Signed)
Seen by Dr. Elease Hashimoto today, will forward cardiac clearance letter below to the surgeon's office. Per Dr. Elease Hashimoto, he is a moderate risk patient for cardiovascular complication

## 2021-07-27 DIAGNOSIS — I110 Hypertensive heart disease with heart failure: Secondary | ICD-10-CM | POA: Diagnosis not present

## 2021-07-27 DIAGNOSIS — J9621 Acute and chronic respiratory failure with hypoxia: Secondary | ICD-10-CM | POA: Diagnosis not present

## 2021-07-27 DIAGNOSIS — K219 Gastro-esophageal reflux disease without esophagitis: Secondary | ICD-10-CM | POA: Diagnosis not present

## 2021-07-27 DIAGNOSIS — D649 Anemia, unspecified: Secondary | ICD-10-CM | POA: Diagnosis not present

## 2021-07-27 DIAGNOSIS — I10 Essential (primary) hypertension: Secondary | ICD-10-CM | POA: Diagnosis not present

## 2021-07-27 DIAGNOSIS — J9622 Acute and chronic respiratory failure with hypercapnia: Secondary | ICD-10-CM | POA: Diagnosis not present

## 2021-07-27 DIAGNOSIS — L89892 Pressure ulcer of other site, stage 2: Secondary | ICD-10-CM | POA: Diagnosis not present

## 2021-07-27 DIAGNOSIS — M109 Gout, unspecified: Secondary | ICD-10-CM | POA: Diagnosis not present

## 2021-07-27 DIAGNOSIS — E119 Type 2 diabetes mellitus without complications: Secondary | ICD-10-CM | POA: Diagnosis not present

## 2021-07-27 DIAGNOSIS — I5030 Unspecified diastolic (congestive) heart failure: Secondary | ICD-10-CM | POA: Diagnosis not present

## 2021-07-27 NOTE — Telephone Encounter (Signed)
I was able to reach patient to discuss transportation. Patient states [facility] will not transport him. Gave the name [facility]- but had no phone number. Called [facility] at [phone] and was told they have transported him before. Transportation appointment has been set up for them to pick up patient at his home address for his appointment on 11/16 at 10am.

## 2021-07-28 ENCOUNTER — Telehealth: Payer: Self-pay | Admitting: Family Medicine

## 2021-07-28 DIAGNOSIS — I10 Essential (primary) hypertension: Secondary | ICD-10-CM | POA: Diagnosis not present

## 2021-07-28 DIAGNOSIS — M1611 Unilateral primary osteoarthritis, right hip: Secondary | ICD-10-CM

## 2021-07-28 MED ORDER — OXYCODONE-ACETAMINOPHEN 10-325 MG PO TABS: 1.0000 | ORAL_TABLET | Freq: Four times a day (QID) | ORAL | 0 refills | Status: DC | PRN

## 2021-07-28 NOTE — Telephone Encounter (Signed)
LOV - 07/05/21 NOV - not scheduled RF - 06/29/21 #120/0

## 2021-07-28 NOTE — Telephone Encounter (Signed)
Sent.  Please check to see when patient is going to see bariatric clinic.  Let me know if there are any outstanding issues that I need to address in the meantime.  I know that he saw cardiology and had his labs collected.  See note on that.   He'll need a set of follow up labs this winter re: DM2, but he isn't due yet.    Thanks.

## 2021-07-28 NOTE — Telephone Encounter (Signed)
  Encourage patient to contact the pharmacy for refills or they can request refills through [facility]  LAST APPOINTMENT DATE:  Please schedule appointment if longer than 1 year  NEXT APPOINTMENT DATE:08/06/21  MEDICATION:oxyCODONE-acetaminophen (PERCOCET) 10-325 MG tablet  Is the patient out of medication? 2 pills left  PHARMACY:[id number] - [place], [place] - [address]  Let patient know to contact pharmacy at the end of the day to make sure medication is ready.  Please notify patient to allow 48-72 hours to process  CLINICAL FILLS OUT ALL BELOW:   LAST REFILL:  QTY:  REFILL DATE:    OTHER COMMENTS:    Okay for refill?  Please advise

## 2021-07-29 ENCOUNTER — Other Ambulatory Visit: Payer: Self-pay | Admitting: Family Medicine

## 2021-07-29 ENCOUNTER — Telehealth: Payer: Self-pay

## 2021-07-29 ENCOUNTER — Telehealth: Payer: Self-pay | Admitting: Family Medicine

## 2021-07-29 DIAGNOSIS — J9622 Acute and chronic respiratory failure with hypercapnia: Secondary | ICD-10-CM | POA: Diagnosis not present

## 2021-07-29 DIAGNOSIS — D649 Anemia, unspecified: Secondary | ICD-10-CM | POA: Diagnosis not present

## 2021-07-29 DIAGNOSIS — E119 Type 2 diabetes mellitus without complications: Secondary | ICD-10-CM | POA: Diagnosis not present

## 2021-07-29 DIAGNOSIS — L89892 Pressure ulcer of other site, stage 2: Secondary | ICD-10-CM | POA: Diagnosis not present

## 2021-07-29 DIAGNOSIS — K219 Gastro-esophageal reflux disease without esophagitis: Secondary | ICD-10-CM | POA: Diagnosis not present

## 2021-07-29 DIAGNOSIS — J9621 Acute and chronic respiratory failure with hypoxia: Secondary | ICD-10-CM | POA: Diagnosis not present

## 2021-07-29 DIAGNOSIS — I110 Hypertensive heart disease with heart failure: Secondary | ICD-10-CM | POA: Diagnosis not present

## 2021-07-29 DIAGNOSIS — I10 Essential (primary) hypertension: Secondary | ICD-10-CM | POA: Diagnosis not present

## 2021-07-29 DIAGNOSIS — M109 Gout, unspecified: Secondary | ICD-10-CM | POA: Diagnosis not present

## 2021-07-29 DIAGNOSIS — I5030 Unspecified diastolic (congestive) heart failure: Secondary | ICD-10-CM | POA: Diagnosis not present

## 2021-07-29 MED ORDER — COLCHICINE 0.6 MG PO TABS: 0.6000 mg | ORAL_TABLET | Freq: Every day | ORAL | 2 refills | Status: DC | PRN

## 2021-07-29 MED ORDER — AMLODIPINE BESYLATE 10 MG PO TABS: 10.0000 mg | ORAL_TABLET | Freq: Every day | ORAL | 1 refills | Status: DC

## 2021-07-29 MED ORDER — ALLOPURINOL 100 MG PO TABS: 100.0000 mg | ORAL_TABLET | Freq: Every day | ORAL | 2 refills | Status: DC

## 2021-07-29 MED ORDER — FUROSEMIDE 40 MG PO TABS: 40.0000 mg | ORAL_TABLET | Freq: Every day | ORAL | 3 refills | Status: DC

## 2021-07-29 MED ORDER — CARVEDILOL 3.125 MG PO TABS
3.1250 mg | ORAL_TABLET | Freq: Two times a day (BID) | ORAL | 2 refills | Status: DC
Start: 2021-07-29 — End: 2021-09-28

## 2021-07-29 MED ORDER — POTASSIUM CHLORIDE CRYS ER 10 MEQ PO TBCR: 10.0000 meq | EXTENDED_RELEASE_TABLET | Freq: Every day | ORAL | 2 refills | Status: DC

## 2021-07-29 NOTE — Addendum Note (Signed)
Addended by: Wendie Simmer B on: 07/29/2021 09:46 AM   Modules accepted: Orders

## 2021-07-29 NOTE — Telephone Encounter (Signed)
Pharmacy requesting change to [facility] instead of [facility]. Please advise.

## 2021-07-29 NOTE — Telephone Encounter (Signed)
Rxs sent. Patient has been notified.

## 2021-07-29 NOTE — Chronic Care Management (AMB) (Addendum)
Chronic Care Management Pharmacy Assistant   Name: Michael [facility]  MRN: [id number] DOB: 1971/04/11  Reason for Encounter:  Hypertension Disease State  Recent office visits:  07/22/21-Family Medicine-LCSW phone call. 07/06/21-Family Medicine LCSW phone call.  Recent consult visits:  07/26/21-Cardiology-Patient presented for pre-op evaluation prior to bariatric surgey.EKG,labs ordered(labs are stable)  Hospital visits:  None in previous 6 months  Medications: Outpatient Encounter Medications as of 07/29/2021  Medication Sig   acetaminophen (TYLENOL) 325 MG tablet Take 2 tablets (650 mg total) by mouth every 6 (six) hours as needed for headache or mild pain.   allopurinol (ZYLOPRIM) 100 MG tablet Take 1 tablet (100 mg total) by mouth daily.   amLODipine (NORVASC) 10 MG tablet Take 1 tablet (10 mg total) by mouth daily.   carvedilol (COREG) 3.125 MG tablet Take 1 tablet (3.125 mg total) by mouth 2 (two) times daily with a meal.   colchicine (COLCRYS) 0.6 MG tablet Take 1 tablet (0.6 mg total) by mouth daily as needed.   diclofenac Sodium (VOLTAREN) 1 % GEL Apply 4 g topically 4 (four) times daily. (Patient not taking: Reported on 07/26/2021)   furosemide (LASIX) 40 MG tablet Take 1 tablet (40 mg total) by mouth daily.   naloxone (NARCAN) nasal spray 4 mg/0.1 mL Spray nostril if needed for opioid overuse- decreased consciousness, decreased respirations   oxyCODONE-acetaminophen (PERCOCET) 10-325 MG tablet Take 1 tablet by mouth every 6 (six) hours as needed for pain.   polyethylene glycol (MIRALAX / GLYCOLAX) 17 g packet Take 17 g by mouth daily as needed for moderate constipation. (Patient not taking: Reported on 07/26/2021)   potassium chloride (KLOR-CON) 10 MEQ tablet Take 1 tablet (10 mEq total) by mouth daily.   TRULICITY 0.75 MG/0.5ML SOPN INJECT 0.75 MG INTO THE SKIN ONCE A WEEK.   No facility-administered encounter medications on file as of 07/29/2021.     Recent Office  Vitals: BP Readings from Last 3 Encounters:  07/26/21 (!) 148/95  06/28/21 (!) 145/80  03/09/21 (!) 160/119   Pulse Readings from Last 3 Encounters:  07/26/21 95  07/05/21 81  06/28/21 80    Wt Readings from Last 3 Encounters:  06/18/21 (!) 595 lb 3.9 oz (270 kg)  11/05/20 (!) 490 lb (222.3 kg)  09/02/20 (!) 490 lb (222.3 kg)     Kidney Function Lab Results  Component Value Date/Time   CREATININE 0.63 (L) 07/26/2021 09:48 AM   CREATININE 0.97 06/23/2021 02:26 AM   CREATININE 0.79 05/27/2015 12:46 PM   CREATININE 0.98 10/08/2014 04:18 PM   GFR 109.18 03/09/2021 12:32 PM   GFRNONAA >60 06/23/2021 02:26 AM   GFRAA >60 01/03/2020 02:33 AM    BMP Latest Ref Rng & Units 07/26/2021 06/23/2021 06/15/2021  Glucose 70 - 99 mg/dL 782(N) 562(Z) 308(M)  BUN 6 - 24 mg/dL 12 57(Q) 46(N)  Creatinine 0.76 - 1.27 mg/dL 6.29(B) 2.84 1.32  BUN/Creat Ratio 9 - 20 19 - -  Sodium 134 - 144 mmol/L 140 136 135  Potassium 3.5 - 5.2 mmol/L 4.3 4.0 4.3  Chloride 96 - 106 mmol/L 103 93(L) 88(L)  CO2 20 - 29 mmol/L 24 31 38(H)  Calcium 8.7 - 10.2 mg/dL 9.2 8.9 9.1     Contacted patient on 07/29/21 to discuss hypertension disease state  Current antihypertensive regimen:  Amlodipine 10mg  take 1 tablet daily Carvedilol 3.125mg  take 1 tablet 2 times daily  Furosemide 40mg  take 1 tablet daily    Patient verbally confirms he is  taking the above medications as directed. Yes  How often are you checking your Blood Pressure? daily Home health comes in and records daily BP  he checks his blood pressure in the morning before taking his medication.  Current home BP readings:   DATE:             BP               PULSE 07/29/21  135/78 07/28/21  141/80 07/27/21  135/82  07/26/21  137/80    Wrist or arm cuff: wrist cuff Caffeine intake:  1 8oz Sprite zero a day  Salt intake: Patient reports he now limits adding salt to foods  OTC medications including pseudoephedrine or NSAIDs?  no  Any readings  above 180/120? No   What recent interventions/DTPs have been made by any provider to improve Blood Pressure control since last CPP Visit: CNA comes to the house every morning and takes BP.  Any recent hospitalizations or ED visits since last visit with CPP? No  What diet changes have been made to improve Blood Pressure Control?   The patient states he now limits salt intake   What exercise is being done to improve your Blood Pressure Control?   The patient is bed bound-last walked 2 years ago   Adherence Review: Is the patient currently on ACE/ARB medication? No Does the patient have >5 day gap between last estimated fill dates? Yes   Star Rating Drugs:  Medication:  Last Fill: Day Supply Trulicity  06/16/21 28 (Mondays)  Amlodipine 10mg  06/29/21 90 Carvedilol 3.125mg  07/29/21 30 Furosemide 40mg  07/29/21 30  Care Gaps: Annual wellness visit in last year? Yes Most Recent BP reading:  135/78  07/29/21  recorded by Home Health   If Diabetic: Most recent A1C reading: 5.9 06/01/21 Last eye exam / retinopathy screening: never done  Last diabetic foot exam:up to date   CCM appointment on 08/23/21   06/03/21, CPP notified  14/5/22, [facility] Pharmacy Assistant [phone]  I have reviewed the care management and care coordination activities outlined in this encounter and I am certifying that I agree with the content of this note. Updated BP in chart.  [facility], PharmD Clinical Pharmacist [facility] [phone]

## 2021-07-29 NOTE — Telephone Encounter (Signed)
Patient notified rx was sent and about lab results. Patient still has plans to see the bariatric clinic and that is in motion he stated. Patient has no outstanding issues that need to be addressed at this time. Patient will set up labs closer to time due.

## 2021-07-29 NOTE — Telephone Encounter (Signed)
  Encourage patient to contact the pharmacy for refills or they can request refills through [facility]  LAST APPOINTMENT DATE:  Please schedule appointment if longer than 1 year  NEXT APPOINTMENT DATE:  MEDICATION:Furosemide, carvedilol, potassium chloride, colchicine, allopurinol, amlodipine  Is the patient out of medication? yes  [facility] [address] [facility] [place]  Let patient know to contact pharmacy at the end of the day to make sure medication is ready.  Please notify patient to allow 48-72 hours to process  CLINICAL FILLS OUT ALL BELOW:   LAST REFILL:  QTY:  REFILL DATE:    OTHER COMMENTS:    Okay for refill?  Please advise

## 2021-07-30 DIAGNOSIS — I10 Essential (primary) hypertension: Secondary | ICD-10-CM | POA: Diagnosis not present

## 2021-07-30 NOTE — Telephone Encounter (Signed)
Rx sent. Thanks

## 2021-07-31 DIAGNOSIS — I110 Hypertensive heart disease with heart failure: Secondary | ICD-10-CM | POA: Diagnosis not present

## 2021-07-31 DIAGNOSIS — E119 Type 2 diabetes mellitus without complications: Secondary | ICD-10-CM | POA: Diagnosis not present

## 2021-07-31 DIAGNOSIS — I5030 Unspecified diastolic (congestive) heart failure: Secondary | ICD-10-CM | POA: Diagnosis not present

## 2021-07-31 DIAGNOSIS — K219 Gastro-esophageal reflux disease without esophagitis: Secondary | ICD-10-CM | POA: Diagnosis not present

## 2021-07-31 DIAGNOSIS — J9621 Acute and chronic respiratory failure with hypoxia: Secondary | ICD-10-CM | POA: Diagnosis not present

## 2021-07-31 DIAGNOSIS — J9622 Acute and chronic respiratory failure with hypercapnia: Secondary | ICD-10-CM | POA: Diagnosis not present

## 2021-07-31 DIAGNOSIS — M109 Gout, unspecified: Secondary | ICD-10-CM | POA: Diagnosis not present

## 2021-07-31 DIAGNOSIS — D649 Anemia, unspecified: Secondary | ICD-10-CM | POA: Diagnosis not present

## 2021-07-31 DIAGNOSIS — L89892 Pressure ulcer of other site, stage 2: Secondary | ICD-10-CM | POA: Diagnosis not present

## 2021-08-02 DIAGNOSIS — I10 Essential (primary) hypertension: Secondary | ICD-10-CM | POA: Diagnosis not present

## 2021-08-03 ENCOUNTER — Telehealth: Payer: Self-pay | Admitting: Pulmonary Disease

## 2021-08-03 DIAGNOSIS — I10 Essential (primary) hypertension: Secondary | ICD-10-CM | POA: Diagnosis not present

## 2021-08-03 NOTE — Telephone Encounter (Signed)
Called and spoke with patient. Advised him that the transportation has already been setup for him. He verbalized understanding.   Nothing further needed at time of call.

## 2021-08-04 ENCOUNTER — Other Ambulatory Visit: Payer: Self-pay

## 2021-08-04 ENCOUNTER — Encounter: Payer: Self-pay | Admitting: Pulmonary Disease

## 2021-08-04 ENCOUNTER — Ambulatory Visit (INDEPENDENT_AMBULATORY_CARE_PROVIDER_SITE_OTHER): Payer: Medicare HMO | Admitting: Pulmonary Disease

## 2021-08-04 ENCOUNTER — Ambulatory Visit: Payer: Self-pay

## 2021-08-04 DIAGNOSIS — J9602 Acute respiratory failure with hypercapnia: Secondary | ICD-10-CM | POA: Diagnosis not present

## 2021-08-04 DIAGNOSIS — M109 Gout, unspecified: Secondary | ICD-10-CM | POA: Diagnosis not present

## 2021-08-04 DIAGNOSIS — I5030 Unspecified diastolic (congestive) heart failure: Secondary | ICD-10-CM | POA: Diagnosis not present

## 2021-08-04 DIAGNOSIS — L89892 Pressure ulcer of other site, stage 2: Secondary | ICD-10-CM | POA: Diagnosis not present

## 2021-08-04 DIAGNOSIS — R0902 Hypoxemia: Secondary | ICD-10-CM | POA: Diagnosis not present

## 2021-08-04 DIAGNOSIS — J9621 Acute and chronic respiratory failure with hypoxia: Secondary | ICD-10-CM | POA: Diagnosis not present

## 2021-08-04 DIAGNOSIS — J9622 Acute and chronic respiratory failure with hypercapnia: Secondary | ICD-10-CM | POA: Diagnosis not present

## 2021-08-04 DIAGNOSIS — E662 Morbid (severe) obesity with alveolar hypoventilation: Secondary | ICD-10-CM

## 2021-08-04 DIAGNOSIS — J9601 Acute respiratory failure with hypoxia: Secondary | ICD-10-CM | POA: Diagnosis not present

## 2021-08-04 DIAGNOSIS — E119 Type 2 diabetes mellitus without complications: Secondary | ICD-10-CM

## 2021-08-04 DIAGNOSIS — G4733 Obstructive sleep apnea (adult) (pediatric): Secondary | ICD-10-CM | POA: Diagnosis not present

## 2021-08-04 DIAGNOSIS — I10 Essential (primary) hypertension: Secondary | ICD-10-CM | POA: Diagnosis not present

## 2021-08-04 DIAGNOSIS — R531 Weakness: Secondary | ICD-10-CM | POA: Diagnosis not present

## 2021-08-04 DIAGNOSIS — I110 Hypertensive heart disease with heart failure: Secondary | ICD-10-CM | POA: Diagnosis not present

## 2021-08-04 DIAGNOSIS — Z743 Need for continuous supervision: Secondary | ICD-10-CM | POA: Diagnosis not present

## 2021-08-04 DIAGNOSIS — D649 Anemia, unspecified: Secondary | ICD-10-CM | POA: Diagnosis not present

## 2021-08-04 DIAGNOSIS — K219 Gastro-esophageal reflux disease without esophagitis: Secondary | ICD-10-CM | POA: Diagnosis not present

## 2021-08-04 DIAGNOSIS — R0681 Apnea, not elsewhere classified: Secondary | ICD-10-CM | POA: Diagnosis not present

## 2021-08-04 NOTE — Patient Instructions (Signed)
Patient Care Plan: Development of Plan for Management of Functional Limitations and Needs     Problem Identified: Social and Functional Symptoms      Long-Range Goal: Optimize pt's quality of life and opportunities   Start Date: 01/25/2021  Expected End Date: 08/18/2021  This Visit's Progress: On track  Recent Progress: On track  Priority: High  Note:   Current barriers:   Transportation, Housing barriers, Level of care concerns, ADL IADL limitations, Social Isolation, Limited access to caregiver, Inability to perform ADL's independently, Inability to perform IADL's independently, and Lacks knowledge of community resource Clinical Goals: Patient will work with CSW  to address needs related to transportation, PCS/CAPS and other needs as assessed and identified Clinical Interventions:  07/21/21: Received call from Lasandra Beech, LCSW/Heart Care, indicating pt was in need of transportation to see a Cardiologist for surgical clearance. CSW advised and provided the steps to seek insurance waiver/auth for this.   07/06/21- Pt reports being released from hospital back to home where he has family assistance. Per pt, he is awaiting Home Health visits as well as his PCS care to be resumed. He states he is on the waiting list for CAPS.  Pt continues to seek weight loss surgery and states "I have a bunch of stuff to do".    CSW offered encouragement with all his goals.   date of comprehensive plan of care as evidenced by provider attestation and co-signature Inter-disciplinary care team collaboration (see longitudinal plan of care) Assessment of needs, barriers , agencies contacted, as well as how impacting  Review various resources, discussed options and provided patient information about Department of Social Services (food stamps, [employer], and utilities assistance), Transportation provided by insurance provider, Enhanced Benefits connected with insurance provider, Referral to care guide for community  support and other options, SCAT transportation, and [employer] Alternative Program CAP Patient interviewed and appropriate assessments performed Referred patient to community resources care guide team for assistance with ramps (building/ex[expense,etc)  Provided mental health counseling with regard to pt's current isolation and being homebound/bedbound Provided patient with information about SCAT, CAPS, transportation resources Discussed plans with patient for ongoing care management follow up and provided patient with direct contact information for care management team Advised patient to call DSS worker to request CAPS services Advised patient to complete PCS application/request for increase in hours provided Collaborated with primary care provider re: insurance prior auth process for stretcher transportation Assisted patient/caregiver with obtaining information about health plan benefits Other interventions provided:Solution-Focused Strategies, Active listening / Reflection utilized , Emotional Supportive Provided, Problem Solving /Task Center , Psychoeducation /Health Education, Motivational Interviewing, Brief CBT , Increase in activities / exercise encouraged (with HHPT), and Provided basic mental health support, education   Patient Goals/Self-Care Activities: Over the next 30 days  Follow Up Date 08/13/21   - keep 90 percent of counseling appointments - schedule counseling appointment  -follow up with PCS office re: staffing of hours, etc.  -follow up with Bariatric Coordinator for next steps  - begin a notebook of services in my neighborhood or community - call 211 when I need some help - follow-up on any referrals for help I am given - think ahead to make sure my need does not become an emergency - make a note about what I need to have by the phone or take with me, like an identification card or social security number have a back-up plan - have a back-up plan - make a list of family or  friends that  I can call -continue working with Home Health PT   -continue with  virtual  counseling - keep 90 percent of counseling appointments - schedule counseling appointment  -follow up with PCS office re: staffing of hours, etc.  -follow up with Bariatric Coordinator for next steps  - begin a notebook of services in my neighborhood or community - call 211 when I need some help - follow-up on any referrals for help I am given - think ahead to make sure my need does not become an emergency - make a note about what I need to have by the phone or take with me, like an identification card or social security number have a back-up plan - have a back-up plan - make a list of family or friends that I can call -continue working with Home Health PT    Problem Identified: Mobility and Independence      Patient Care Plan: Chronic pain due to osteoarthritis     Problem Identified: Decreased mobility due to osteoarthritis pain and obesity   Priority: High     Long-Range Goal: Patient will perform physical activity within limits of disease   Start Date: 02/11/2021  Expected End Date: 08/18/2021  This Visit's Progress: On track  Recent Progress: On track  Priority: High  Note:   Current Barriers: Patient states he continues to receive home health physical therapy.  He reports working on [employer] transfers to wheelchair  with therapist and states family and aid are also being trained on how to use lift. Patient states he is able to sit in wheelchair for approximately 20-30 minutes.  He states he continues to have hip pain that ranges from 6 to 7 on a scale of 1 to 10. He states he is scheduled to follow up with the bariatric surgeon on 05/28/2021. Care guide to assist with [facility] authorization for  EMS transportation if needed.  Patient states he continues to do home exercises on non home health therapy days. He states he is currently taking Trulicity which has helped to curb his appetite and is  not on a Keto diet.  Knowledge Deficits related to self-health management of chronic pain Chronic Disease Management support and education needs related to chronic pain Transportation barriers  Not attend all scheduled provider appointments Unable to perform ADLs independently: Patient continues with PCS services 2 1/2 hours a day, 7 days a week.  Unable to perform IADLs independently.  Clinical Goal(s):  patient will verbalize understanding of plan for pain management. Patient states he takes his pain medication as prescribed. RNCM advised patient to consider taking pain medication prior to physical and/ or occupational therapy visits.  patient will report pain at a level less than 3 to 4 on a 10-10 rating scale., patient will use pharmacological and nonpharmacological pain relief strategies as prescribed. patient will verbalize acceptable level of pain relief and ability to engage in desired activities Patient will work on healthy eating for weight loss  Interventions:  Collaboration with Joaquim Nam, MD regarding development and update of comprehensive plan of care as evidenced by provider attestation and co-signature:   Inter-disciplinary care team collaboration (see longitudinal plan of care):  Telephone call to [employer] [facility] regarding patients CPAP concerns. Spoke with Lawanna Kobus in the CPAP division who states she will have a respiratory therapist return call to discuss.  Pain assessment performed Discussed plans with patient for ongoing care management follow up and provided patient with direct contact information for care management team Reviewed  medications with patient and discussed  Reviewed scheduled/upcoming provider appointments Notified social worker patient would like counseling resources for coping with chronic pain. Due to bed bound status, advised patient to turn frequently to prevent skin breakdown.  Reviewed skin breakdown signs/ symptoms.   Patient Goals/Self Care  Activities:  Continue to take your medications as prescribed. Refill your medications timely Attend doctors visits as recommended.  (virtually and/ or in person when able) Call your provider office for new concerns or questions Continue to turn frequently in bed to prevent skin breakdown. Be aware of signs of skin break down: redness and skin tears.  Report any new symptoms to your primary care provider and/ or wound care physician.   Continue to perform exercises provided to you by home physical therapist. Continue to work with home health physical therapy. Perform home exercises as instructed by your physical / occupational therapist.  Apply hot or cold pack for pain relief to affected area Use assistive devices as recommended by your provider and physical therapist Continue to incorporate healthy food choices to assist with weight loss.  Follow Up Plan: The patient has been provided with contact information for the care management team and has been advised to call with any health related questions or concerns.  The care management team will reach out to the patient again over the next 30 days.          Patient Care Plan: Cardiovascular     Problem Identified: Symptom management of HF, hypertension   Priority: High     Long-Range Goal: Hypertension / Heart failure monitored and managed   Start Date: 02/11/2021  Expected End Date: 11/16/2021  This Visit's Progress: On track  Recent Progress: On track  Priority: High  Note:   Objective:  Last practice recorded BP readings:  BP Readings from Last 3 Encounters:  06/28/21 (!) 145/80  03/09/21 (!) 160/119  11/05/20 127/86  Current Barriers: Per chart review patient admitted to hospital from 06/01/2021 - 06/28/2021 for respiratory failure, hypoxia, congestive heart failure.  Patient states he currently has home health services ( RN, PT, OT, CNA) services, home O2, hospital bed with trapeze, [place] lift, wheelchair and walker.  Patient  states prior to admission he was receiving aide services 7 days per week with [facility].  Patient states he has since been discharged from the services and is currently working with [employer] to secure services with another provider.  Patient states his daughter and wife are helping him with ADL's and IADL's. He states he is also receiving meals his [facility]. He reports having a post hospital video follow up visit with his primary care provider on 07/05/2021.  Patient reports his oxygen usage is as needed.  He reports his pulse oximetry readings have ranged from 91-97%.  Patient denies any increase in symptoms related to shortness of breath or swelling in lower extremities.    Knowledge Deficits related to long term care plan for self management of  heart failure and hypertension pathophysiology and self care management Transportation barriers Not attend all scheduled provider appointments Unable to perform ADLs independently Case Manager Clinical Goal(s):  patient will demonstrate improved adherence to prescribed treatment plan for hypertension as evidenced by taking all medications as prescribed, monitoring and recording blood pressure as directed, adhering to low sodium/DASH diet patient will verbalize basic understanding of heart failure / hypertension disease process and self health management plan. Interventions:  Collaboration with Joaquim Nam, MD regarding development and update of comprehensive  plan of care as evidenced by provider attestation and co-signature:    Patient states he has been in contact with the bariatric clinic and has to complete paperwork to return to them. Per chart review of primary care providers' 03/09/2021 note provider will look at ordering [facility] lift for patient.  Inter-disciplinary care team collaboration (see longitudinal plan of care) Evaluation of current treatment plan related to hypertension self management and patient's adherence to plan as established  by provider. Provided verbal / written education regarding heart failure and heart failure action plan Reviewed medications with patient and discussed importance of compliance Discussed plans with patient for ongoing care management follow up and provided patient with direct contact information for care management team Advised patient, providing education and rationale, to monitor blood pressure daily and record, calling PCP for findings outside established parameters.  Reviewed scheduled/upcoming provider appointments Discussed importance of using CPAP machine for sleep apnea Self-Care Activities: - Self administers medications as prescribed Calls provider office for new concerns, questions, or BP outside discussed parameters Checks BP and records as discussed Monitors for signs/ symptoms of heart failure Follows a low sodium diet/DASH diet Patient Goals: - Continue to check or have blood pressure checked 2-3 times per week  and record - Continue to follow low sodium/ DASH diet and eat more fruits, vegetables and lean meats. Cook meats by broiling, baking or roasting. Drink plenty of water, read food labels and use smaller plates for portion control.  -  Continue to take your medications as prescribed and refill timely.  - Attend doctor visits whether virtually and/ or in person (when transportation needs are arranged).  - Use CPAP as recommended by your doctor.  - Review education information sent to you in my chart on heart failure and heart failure action plan - Notify your provider as soon as possible for new or ongoing symptoms ( heart failure symptoms: weight gain of 3lbs in 1 day or 5 lbs in 1 week, swelling in feet, ankles, stomach, or hands, harder to breath when lying down, needing to sit up, less energy more tired, chest discomfort, heaviness or pain) - Continue to work with Licensed clinical Child psychotherapist as needed Follow Up Plan: The patient has been provided with contact  information for the care management team and has been advised to call with any health related questions or concerns.  The care management team will reach out to the patient again over the next 1-2 months.          Patient Care Plan: CCM Pharmacy Care Plan     Problem Identified: CHL AMB "PATIENT-SPECIFIC PROBLEM"      Long-Range Goal: Disease Management   Start Date: 03/16/2021  Note:   Current Barriers:  No medication concerns identified  Pharmacist Clinical Goal(s):  Patient will contact provider office for questions/concerns as evidenced notation of same in electronic health record through collaboration with PharmD and provider.   Interventions: 1:1 collaboration with Joaquim Nam, MD regarding development and update of comprehensive plan of care as evidenced by provider attestation and co-signature Inter-disciplinary care team collaboration (see longitudinal plan of care) Comprehensive medication review performed; medication list updated in electronic medical record  Hypertension (BP goal <140/90) -Update 08/04/21, patient home BP is monitored daily by [facility] and within goal. Updated chart. -Controlled - per home readings  -Current treatment: Amlodipine 10 mg - 1 tablet daily -Medications previously tried: none reported  -Current home readings: Patient reports BP is checked by PT twice weekly -  usually around 135/81-88 mmHg -Denies hypotensive/hypertensive symptoms -Educated on BP goals and benefits of medications for prevention of heart attack, stroke and kidney damage; -Continue to monitor BP at home twice weekly, document, and provide log at future appointments -Recommended to continue current medication  Diabetes (A1c goal <7%) -Controlled - A1c 6.5% -Current medications: None -Medications previously tried: None reported   -Current home glucose readings - none reported today -Denies hypoglycemic/hyperglycemic symptoms -Current meal patterns: pt reports he is  working on diet - limiting carbs, sweets, and fried foods.  drinks: diet soda - 1-2 per day, water - Weight loss: He reports last attempted weight loss about 3 years ago for surgery. This was delayed due to family emergency. He is currently enrolled in a weight loss program and reports first visit is soon, pending transportation.   He denies counting calories at this time. -Current exercise: bed bound -Educated on A1c and blood sugar goals; -I was consulted by PCP to discuss Trulicity cost and concerns with patient. This was reviewed. See telephone note 03/16/21. -Recommended start Trulicity for weight loss/DM. See telephone note.   Patient Goals/Self-Care Activities Patient will:  - take medications as prescribed  Follow Up Plan: Telephone follow up appointment with care management team member scheduled for:  30 days      Phil Dopp, PharmD Clinical Pharmacist [facility] [phone]

## 2021-08-04 NOTE — Patient Instructions (Signed)
Tentative follow-up in about 6 months  Continue using BiPAP on a nightly basis   We will try and get more information from [employer] regarding your machine and how it is working   Continue weight loss efforts  Call us with significant concerns  CPAP supplies-DME referral--mask and headgear

## 2021-08-04 NOTE — Progress Notes (Signed)
Michael Payne    [id number]    03/24/1971  Primary Care Physician:Duncan, Dwana Curd, MD  Referring Physician: Joaquim Nam, MD [address] [place],  [place] [address]  Chief complaint:   Patient with obstructive sleep apnea  HPI:  History of obstructive sleep apnea  Recent hospitalization for hypercapnic respiratory failure for which he was on a ventilator -Was discharged on BiPAP  Has been doing very well since his discharge -Uses machine nightly -Waking up feeling restored -He does have some dryness of his mouth in the mornings -Denies any sleepiness during the day  Initial difficulty with his CPAP was that he was not able to get used to it but since his recent hospitalization and discharged on BiPAP, has been tolerating it well  Has always been very heavy Last time he walked was over 2 years ago, did have a hip injury for which he needed surgery but needed to lose a lot of weight before the surgery  He continues to work on weight loss at present  He usually goes to bed about 10 PM, takes him about 20 minutes to fall asleep About 1 awakening Final wake up time about 630  Outpatient Encounter Medications as of 08/04/2021  Medication Sig   acetaminophen (TYLENOL) 325 MG tablet Take 2 tablets (650 mg total) by mouth every 6 (six) hours as needed for headache or mild pain.   allopurinol (ZYLOPRIM) 100 MG tablet Take 1 tablet (100 mg total) by mouth daily.   amLODipine (NORVASC) 10 MG tablet Take 1 tablet (10 mg total) by mouth daily.   carvedilol (COREG) 3.125 MG tablet Take 1 tablet (3.125 mg total) by mouth 2 (two) times daily with a meal.   Colchicine (MITIGARE) 0.6 MG CAPS Take 0.5 tablets by mouth daily as needed (for gout.  okay to fill with mitigare).   diclofenac Sodium (VOLTAREN) 1 % GEL Apply 4 g topically 4 (four) times daily. (Patient not taking: Reported on 07/26/2021)   furosemide (LASIX) 40 MG tablet Take 1 tablet (40 mg total) by mouth  daily.   naloxone (NARCAN) nasal spray 4 mg/0.1 mL Spray nostril if needed for opioid overuse- decreased consciousness, decreased respirations   oxyCODONE-acetaminophen (PERCOCET) 10-325 MG tablet Take 1 tablet by mouth every 6 (six) hours as needed for pain.   polyethylene glycol (MIRALAX / GLYCOLAX) 17 g packet Take 17 g by mouth daily as needed for moderate constipation. (Patient not taking: Reported on 07/26/2021)   potassium chloride (KLOR-CON) 10 MEQ tablet Take 1 tablet (10 mEq total) by mouth daily.   TRULICITY 0.75 MG/0.5ML SOPN INJECT 0.75 MG INTO THE SKIN ONCE A WEEK.   No facility-administered encounter medications on file as of 08/04/2021.    Allergies as of 08/04/2021 - Review Complete 07/26/2021  Allergen Reaction Noted   Aspirin Other (See Comments) 02/03/2012   Lactose intolerance (gi) Nausea And Vomiting 12/27/2019   Lisinopril  06/04/2021    Past Medical History:  Diagnosis Date   Anemia    borderline anemia, was instructed to eat more iron filled foods   GERD (gastroesophageal reflux disease)    lactose intolerance   Hip pain    HTN (hypertension)    Obesity    Sleep apnea     Past Surgical History:  Procedure Laterality Date   FOOT SURGERY     KNEE ARTHROSCOPY WITH MEDIAL MENISECTOMY Left 12/16/2014   Procedure: KNEE ARTHROSCOPY WITH MEDIAL MENISECTOMY;  Surgeon: Sheral Apley, MD;  Location: [facility] OR;  Service: Orthopedics;  Laterality: Left;    Family History  Problem Relation Age of Onset   Heart attack Mother 82   Lung cancer Father    Cancer Father    CAD Maternal Grandfather     Social History   Socioeconomic History   Marital status: Married    Spouse name: Not on file   Number of children: 2   Years of education: Not on file   Highest education level: Not on file  Occupational History   Occupation: disabled  Tobacco Use   Smoking status: Former    Packs/day: 2.00    Years: 22.00    Pack years: 44.00    Types: Cigarettes    Quit  date: 02/24/2011    Years since quitting: 10.4   Smokeless tobacco: Never  Vaping Use   Vaping Use: Never used  Substance and Sexual Activity   Alcohol use: Not Currently    Comment: Weekends/beer-- has slowed down  alot   Drug use: Not Currently    Types: Marijuana    Comment: former -  quit in 2012   Sexual activity: Yes    Partners: Female  Other Topics Concern   Not on file  Social History Narrative   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   Former Hydrologist.   Married.   Social Determinants of Health   Financial Resource Strain: Low Risk    Difficulty of Paying Living Expenses: Not hard at all  Food Insecurity: No Food Insecurity   Worried About Programme researcher, broadcasting/film/video in the Last Year: Never true   Barista in the Last Year: Never true  Transportation Needs: Unmet Transportation Needs   Lack of Transportation (Medical): Yes   Lack of Transportation (Non-Medical): Yes  Physical Activity: Inactive   Days of Exercise per Week: 0 days   Minutes of Exercise per Session: 0 min  Stress: Stress Concern Present   Feeling of Stress : To some extent  Social Connections: Unknown   Frequency of Communication with Friends and Family: Twice a week   Frequency of Social Gatherings with Friends and Family: Once a week   Attends Religious Services: Never   Database administrator or Organizations: No   Attends Engineer, structural: Never   Marital Status: Not on file  Intimate Partner Violence: Not At Risk   Fear of Current or Ex-Partner: No   Emotionally Abused: No   Physically Abused: No   Sexually Abused: No    Review of Systems  Constitutional:  Positive for fatigue.  Psychiatric/Behavioral:  Positive for sleep disturbance.    There were no vitals filed for this visit.   Physical Exam Constitutional:      Appearance: He is obese.  HENT:     Head: Normocephalic.     Nose: Nose normal.     Mouth/Throat:     Mouth: Mucous membranes are  moist.     Comments: Macroglossia, crowded oropharynx, Mallampati 4 Cardiovascular:     Rate and Rhythm: Normal rate and regular rhythm.     Heart sounds: No murmur heard.   No friction rub.  Pulmonary:     Effort: No respiratory distress.     Breath sounds: No stridor. No wheezing or rhonchi.  Musculoskeletal:     Cervical back: No rigidity or tenderness.  Neurological:     Mental Status: He is alert.  Psychiatric:  Mood and Affect: Mood normal.   Data Reviewed: Recent hospital records reviewed  Last ABG significant for hypercapnic respiratory failure with PCO2 of 66  Last sleep study was June 2019 showing an AHI of 31, titrated to CPAP of 13  Assessment:   Obstructive sleep apnea  Obesity hypoventilation  Class III obesity  History of hypertension  Diastolic heart failure   Plan/Recommendations: Continue trilogy ventilator  Clinically appears to be doing very well  We will get download from DME company  CPAP supplies will be called in  Encouraged to call with any significant concerns  Tentative follow-up in about 6 months     Virl Diamond MD [facility] 08/04/2021, 10:25 AM  CC: Joaquim Nam, MD

## 2021-08-04 NOTE — Progress Notes (Signed)
Updated BP in chart from home health readings. Home BP is controlled.  BP Readings from Last 3 Encounters:  08/04/21 135/78  07/26/21 (!) 148/95  06/28/21 (!) 145/80    Patient Care Plan: CCM Pharmacy Care Plan     Problem Identified: CHL AMB "PATIENT-SPECIFIC PROBLEM"      Long-Range Goal: Disease Management   Start Date: 03/16/2021  Note:   Current Barriers:  No medication concerns identified  Pharmacist Clinical Goal(s):  Patient will contact provider office for questions/concerns as evidenced notation of same in electronic health record through collaboration with PharmD and provider.   Interventions: 1:1 collaboration with Joaquim Nam, MD regarding development and update of comprehensive plan of care as evidenced by provider attestation and co-signature Inter-disciplinary care team collaboration (see longitudinal plan of care) Comprehensive medication review performed; medication list updated in electronic medical record  Hypertension (BP goal <140/90) -Update 08/04/21, patient home BP is monitored daily by [facility] and within goal. Updated chart. -Controlled - per home readings  -Current treatment: Amlodipine 10 mg - 1 tablet daily -Medications previously tried: none reported  -Current home readings: Patient reports BP is checked by PT twice weekly - usually around 135/81-88 mmHg -Denies hypotensive/hypertensive symptoms -Educated on BP goals and benefits of medications for prevention of heart attack, stroke and kidney damage; -Continue to monitor BP at home twice weekly, document, and provide log at future appointments -Recommended to continue current medication  Diabetes (A1c goal <7%) -Controlled - A1c 6.5% -Current medications: None -Medications previously tried: None reported   -Current home glucose readings - none reported today -Denies hypoglycemic/hyperglycemic symptoms -Current meal patterns: pt reports he is working on diet - limiting carbs, sweets, and  fried foods.  drinks: diet soda - 1-2 per day, water - Weight loss: He reports last attempted weight loss about 3 years ago for surgery. This was delayed due to family emergency. He is currently enrolled in a weight loss program and reports first visit is soon, pending transportation.   He denies counting calories at this time. -Current exercise: bed bound -Educated on A1c and blood sugar goals; -I was consulted by PCP to discuss Trulicity cost and concerns with patient. This was reviewed. See telephone note 03/16/21. -Recommended start Trulicity for weight loss/DM. See telephone note.   Patient Goals/Self-Care Activities Patient will:  - take medications as prescribed  Follow Up Plan: Telephone follow up appointment with care management team member scheduled for:  30 days     Phil Dopp, PharmD Clinical Pharmacist [facility] Primary [facility] at [facility] [phone]

## 2021-08-05 DIAGNOSIS — J9622 Acute and chronic respiratory failure with hypercapnia: Secondary | ICD-10-CM | POA: Diagnosis not present

## 2021-08-05 DIAGNOSIS — K219 Gastro-esophageal reflux disease without esophagitis: Secondary | ICD-10-CM | POA: Diagnosis not present

## 2021-08-05 DIAGNOSIS — L89892 Pressure ulcer of other site, stage 2: Secondary | ICD-10-CM | POA: Diagnosis not present

## 2021-08-05 DIAGNOSIS — J9621 Acute and chronic respiratory failure with hypoxia: Secondary | ICD-10-CM | POA: Diagnosis not present

## 2021-08-05 DIAGNOSIS — I10 Essential (primary) hypertension: Secondary | ICD-10-CM | POA: Diagnosis not present

## 2021-08-05 DIAGNOSIS — M109 Gout, unspecified: Secondary | ICD-10-CM | POA: Diagnosis not present

## 2021-08-05 DIAGNOSIS — I110 Hypertensive heart disease with heart failure: Secondary | ICD-10-CM | POA: Diagnosis not present

## 2021-08-05 DIAGNOSIS — E119 Type 2 diabetes mellitus without complications: Secondary | ICD-10-CM | POA: Diagnosis not present

## 2021-08-05 DIAGNOSIS — D649 Anemia, unspecified: Secondary | ICD-10-CM | POA: Diagnosis not present

## 2021-08-05 DIAGNOSIS — I5030 Unspecified diastolic (congestive) heart failure: Secondary | ICD-10-CM | POA: Diagnosis not present

## 2021-08-06 ENCOUNTER — Ambulatory Visit: Payer: Medicare HMO

## 2021-08-06 DIAGNOSIS — I10 Essential (primary) hypertension: Secondary | ICD-10-CM | POA: Diagnosis not present

## 2021-08-06 DIAGNOSIS — L89892 Pressure ulcer of other site, stage 2: Secondary | ICD-10-CM | POA: Diagnosis not present

## 2021-08-06 DIAGNOSIS — E119 Type 2 diabetes mellitus without complications: Secondary | ICD-10-CM | POA: Diagnosis not present

## 2021-08-06 DIAGNOSIS — I5032 Chronic diastolic (congestive) heart failure: Secondary | ICD-10-CM

## 2021-08-06 DIAGNOSIS — J9621 Acute and chronic respiratory failure with hypoxia: Secondary | ICD-10-CM | POA: Diagnosis not present

## 2021-08-06 DIAGNOSIS — I110 Hypertensive heart disease with heart failure: Secondary | ICD-10-CM | POA: Diagnosis not present

## 2021-08-06 DIAGNOSIS — M25551 Pain in right hip: Secondary | ICD-10-CM

## 2021-08-06 DIAGNOSIS — J9622 Acute and chronic respiratory failure with hypercapnia: Secondary | ICD-10-CM | POA: Diagnosis not present

## 2021-08-06 DIAGNOSIS — I5030 Unspecified diastolic (congestive) heart failure: Secondary | ICD-10-CM | POA: Diagnosis not present

## 2021-08-06 DIAGNOSIS — G8929 Other chronic pain: Secondary | ICD-10-CM

## 2021-08-06 DIAGNOSIS — M109 Gout, unspecified: Secondary | ICD-10-CM | POA: Diagnosis not present

## 2021-08-06 DIAGNOSIS — K219 Gastro-esophageal reflux disease without esophagitis: Secondary | ICD-10-CM | POA: Diagnosis not present

## 2021-08-06 DIAGNOSIS — G4733 Obstructive sleep apnea (adult) (pediatric): Secondary | ICD-10-CM

## 2021-08-06 DIAGNOSIS — D649 Anemia, unspecified: Secondary | ICD-10-CM | POA: Diagnosis not present

## 2021-08-06 NOTE — Patient Instructions (Signed)
Visit Information:  Thank you for taking time to visit with me today. Please don't hesitate to contact me if I can be of assistance to you before our next scheduled telephone appointment.  The patient has been provided with contact information for the care management team and has been advised to call with any health related questions or concerns.  The care management team will reach out to the patient on October 22, 2021 at 2:00 pm.    If you need to cancel or re-schedule our visit, please call [phone] and our care guide team will be happy to assist you.  Patient Goals/Self-Care Activities: Patient will self administer medications as prescribed as evidenced by self report/primary caregiver report  Patient will attend all scheduled provider appointments as evidenced by clinician review of documented attendance to scheduled appointments and patient/caregiver report Patient will call pharmacy for medication refills as evidenced by patient report and review of pharmacy fill history as appropriate Patient will call provider office for new concerns or questions as evidenced by review of documented incoming telephone call notes and patient report watch for swelling in feet, ankles and legs every day know when to call the doctor  - call doctor for signs and symptoms of high blood pressure - keep all doctor appointments Follow diet plan provided to you by the nutritionist Follow a low salt diet as advised by your provider.  Continue to perform exercises provided to you by home physical therapist. Continue to work with home health physical therapy. Perform home exercises as instructed by your physical / occupational therapist.  Apply hot or cold pack for pain relief to affected area  Patient verbalizes understanding of instructions provided today and agrees to view in MyChart.   George Ina RN,BSN,CCM RN Case Manager [facility]  [phone]

## 2021-08-06 NOTE — Chronic Care Management (AMB) (Signed)
Chronic Care Management   CCM RN Visit Note  08/06/2021 Name: Michael Payne MRN: [id number] DOB: April 08, 1971  Subjective: Michael Payne is a 50 y.o. year old male who is a primary care patient of [name], MD. The care management team was consulted for assistance with disease management and care coordination needs.    Engaged with patient by telephone for follow up visit in response to provider referral for case management and/or care coordination services.   Consent to Services:  The patient was given information about Chronic Care Management services, agreed to services, and gave verbal consent prior to initiation of services.  Please see initial visit note for detailed documentation.   Patient agreed to services and verbal consent obtained.   Assessment: Review of patient past medical history, allergies, medications, health status, including review of consultants reports, laboratory and other test data, was performed as part of comprehensive evaluation and provision of chronic care management services.   SDOH (Social Determinants of Health) assessments and interventions performed:    CCM Care Plan  Allergies  Allergen Reactions   Aspirin Other (See Comments)    Upset stomach   Lactose Intolerance (Gi) Nausea And Vomiting   Lisinopril     Causes cough    Outpatient Encounter Medications as of 08/06/2021  Medication Sig   acetaminophen (TYLENOL) 325 MG tablet Take 2 tablets (650 mg total) by mouth every 6 (six) hours as needed for headache or mild pain.   allopurinol (ZYLOPRIM) 100 MG tablet Take 1 tablet (100 mg total) by mouth daily.   amLODipine (NORVASC) 10 MG tablet Take 1 tablet (10 mg total) by mouth daily.   carvedilol (COREG) 3.125 MG tablet Take 1 tablet (3.125 mg total) by mouth 2 (two) times daily with a meal.   Colchicine (MITIGARE) 0.6 MG CAPS Take 0.5 tablets by mouth daily as needed (for gout.  okay to fill with mitigare).   diclofenac Sodium (VOLTAREN)  1 % GEL Apply 4 g topically 4 (four) times daily.   furosemide (LASIX) 40 MG tablet Take 1 tablet (40 mg total) by mouth daily.   Multiple Vitamins-Minerals (MULTIVITAMIN WITH MINERALS) tablet Take 1 tablet by mouth daily.   naloxone (NARCAN) nasal spray 4 mg/0.1 mL Spray nostril if needed for opioid overuse- decreased consciousness, decreased respirations   oxyCODONE-acetaminophen (PERCOCET) 10-325 MG tablet Take 1 tablet by mouth every 6 (six) hours as needed for pain.   polyethylene glycol (MIRALAX / GLYCOLAX) 17 g packet Take 17 g by mouth daily as needed for moderate constipation.   potassium chloride (KLOR-CON) 10 MEQ tablet Take 1 tablet (10 mEq total) by mouth daily.   TRULICITY 4.09 WJ/1.9JY SOPN INJECT 0.75 MG INTO THE SKIN ONCE A WEEK.   vitamin C (ASCORBIC ACID) 500 MG tablet Take 500 mg by mouth 2 (two) times daily.   No facility-administered encounter medications on file as of 08/06/2021.    Patient Active Problem List   Diagnosis Date Noted   Nonobstructive transaminitis 06/28/2021   Decubitus ulcer of posterior thigh, stage 2 ([place]) 06/28/2021   Acute kidney injury (resolved) 06/28/2021   Pre-diabetes 06/10/2021   Obesity, Class III, BMI 40-49.9 (morbid obesity) ([place]) 06/10/2021   Osteoarthritis 06/10/2021   Pressure injury of skin 06/09/2021   Respiratory failure ([place]) 78/29/5621   Acute diastolic CHF (congestive heart failure) ([place]) 05/31/2021   CHF (congestive heart failure) ([place]) 05/31/2021   Hypoxia 05/31/2021   Gout, unspecified 03/10/2021   Edema 03/10/2021   Diabetes  mellitus without complication ([place]) 60/06/9322   Muscle spasms of both lower extremities 07/29/2020   Bedbound 05/22/2020   Unilateral osteoarthritis of hip, right 12/27/2019   Severe obstructive sleep apnea 02/22/2018   Hypertriglyceridemia 08/24/2017   Low HDL (under 40) 55/73/2202   Metabolic syndrome 54/27/0623   Morbid obesity ([place]) 10/14/2013   HTN (hypertension) 10/14/2013     Conditions to be addressed/monitored:CHF, HTN, and Osteoarthritis, chronic pain, obesity  Care Plan : [facility] plan of care  Updates made by Dannielle Karvonen, RN since 08/06/2021 12:00 AM     Problem: Chronic disease management, education  and/ or care coordination needs   Priority: High     Long-Range Goal: Development of plan of care to address chronic disease management and/ or care coodination needs   Start Date: 08/06/2021  Expected End Date: 11/16/2021  Priority: High  Note:   Current Barriers:  Knowledge Deficits related to plan of care for management of HTN and osteoarthritis / morbid obesity, heart failure  Chronic Disease Management support and education needs related to HTN and osteoarthritis / morbid obesity, heart failure Transportation barriers Mobility limitations due to: Osteoarthritis of hip/ chronic pain/ morbid obesity.  Patient is homebound and has not walked in 2 years.  Patient states, " I am doing wonderful."  He states he recently saw his cardiologist for pre-op exam and saw his pulmonologist. He states he is wearing his CPAP/bipap and feels very rested when he wakes up in the morning. He reports exercising at least 5 times per day.  Patient states he continues to work with home health physical therapist.  He reports seeing a nutritionist and following prescribed diet plan. He reports compliance with low salt diet. Patient voiced his excitement and motivation regarding potential for bariatric surgery.  He states he has to see the nutritionist for 6 months then he is able to have surgery.  He states he continues to receive mom's meals for lunch and his daughter cooks for him.  Patient reports pain in his hip still comes and goes. He states his pain level today in hip is a 6.   Patient denies any increase shortness of breath or swelling in lower extremities. He states he continues to take his fluid pill as prescribed.  RNCM Clinical Goal(s):  Patient will verbalize  understanding of plan for management of HTN and osteoarthritis, morbid obesity, heart failure take all medications exactly as prescribed and will call provider for medication related questions attend all scheduled medical appointments:  continue to work with RN Care Manager to address care management and care coordination needs related to  HTN and osteoarthritis, morbid obesity, heart failure work with community resource care guide to address needs related to  Transportation through collaboration with Consulting civil engineer, Education officer, museum, provider, and care team.   Interventions: 1:1 collaboration with primary care provider regarding development and update of comprehensive plan of care as evidenced by provider attestation and co-signature Inter-disciplinary care team collaboration (see longitudinal plan of care) Evaluation of current treatment plan related to  self management and patient's adherence to plan as established by provider  Osteoarthritis /Pain Interventions: Goal on track: Yes Pain assessment performed Medications reviewed Reviewed provider established plan for pain management; Discussed importance of adherence to all scheduled medical appointments; Counseled on the importance of reporting any/all new or changed pain symptoms or management strategies to pain management provider; Discussed use of relaxation techniques and/or diversional activities to assist with pain reduction (distraction, imagery, relaxation, massage, acupressure, TENS,  heat, and cold application; Reviewed with patient prescribed pharmacological and nonpharmacological pain relief strategies; Inquired of patients current pain level.  Advised to continue to work with home health PT and perform home exercises as advised.  Hypertension Interventions:  Goal on track: Yes Last practice recorded BP readings:  BP Readings from Last 3 Encounters:  08/04/21 135/78  07/26/21 (!) 148/95  06/28/21 (!) 145/80  Most recent  eGFR/CrCl:  Lab Results  Component Value Date   EGFR 117 07/26/2021    No components found for: CRCL  Evaluation of current treatment plan related to hypertension self management and patient's adherence to plan as established by provider; Reviewed medications with patient and discussed importance of compliance; Discussed plans with patient for ongoing care management follow up and provided patient with direct contact information for care management team; Reviewed scheduled/upcoming provider appointments including:   Obesity interventions: New goal. Evaluation of current treatment plan related to  obesity ,  self-management and patient's adherence to plan as established by provider. Discussed plans with patient for ongoing care management follow up and provided patient with direct contact information for care management team Evaluation of current treatment plan related to obesity and patient's adherence to plan as established by provider; Reviewed medications with patient and discussed  ; Reviewed scheduled/upcoming provider appointments including; Discussed plans with patient for ongoing care management follow up and provided patient with direct contact information for care management team; Advised patient to continue follow up with nutritionist and follow prescribed diet plan Advised patient to continue daily exercises routine  Heart Failure Interventions: Goal on track: Yes Provided education on low sodium diet; Reviewed Heart Failure Action Plan in depth and provided written copy; Discussed the importance of keeping all appointments with provider; Provided patient with education about the role of exercise in the management of heart failure;  Continue to use CPAP/ bipap as advised by doctor Advised to take medications as prescribed.   Patient Goals/Self-Care Activities: Patient will self administer medications as prescribed as evidenced by self report/primary caregiver report   Patient will attend all scheduled provider appointments as evidenced by clinician review of documented attendance to scheduled appointments and patient/caregiver report Patient will call pharmacy for medication refills as evidenced by patient report and review of pharmacy fill history as appropriate Patient will call provider office for new concerns or questions as evidenced by review of documented incoming telephone call notes and patient report watch for swelling in feet, ankles and legs every day know when to call the doctor  - call doctor for signs and symptoms of high blood pressure - keep all doctor appointments Follow diet plan provided to you by the nutritionist Follow a low salt diet as advised by your provider.  Continue to perform exercises provided to you by home physical therapist. Continue to work with home health physical therapy. Perform home exercises as instructed by your physical / occupational therapist.  Apply hot or cold pack for pain relief to affected area          Plan:The patient has been provided with contact information for the care management team and has been advised to call with any health related questions or concerns.  The care management team will reach out to the patient again over the next 2-3 months . Quinn Plowman RN,BSN,CCM RN Case Manager [place]  [phone]

## 2021-08-07 DIAGNOSIS — I10 Essential (primary) hypertension: Secondary | ICD-10-CM | POA: Diagnosis not present

## 2021-08-09 ENCOUNTER — Telehealth: Payer: Self-pay | Admitting: Family Medicine

## 2021-08-09 DIAGNOSIS — I10 Essential (primary) hypertension: Secondary | ICD-10-CM | POA: Diagnosis not present

## 2021-08-09 NOTE — Telephone Encounter (Signed)
Patient needs an order to have his oxygen tanks and machine picked up from his home. He is no longer using them. Order goes to [facility] at [phone] once done.

## 2021-08-09 NOTE — Telephone Encounter (Signed)
Pt called stating that he needs a prescription to pick up 2 oxygen tanks and the machine that creates the oxygen because pt is not using them. Pt states that he would like the prescription faxed to [phone].

## 2021-08-10 DIAGNOSIS — J9621 Acute and chronic respiratory failure with hypoxia: Secondary | ICD-10-CM | POA: Diagnosis not present

## 2021-08-10 DIAGNOSIS — M109 Gout, unspecified: Secondary | ICD-10-CM | POA: Diagnosis not present

## 2021-08-10 DIAGNOSIS — J9622 Acute and chronic respiratory failure with hypercapnia: Secondary | ICD-10-CM | POA: Diagnosis not present

## 2021-08-10 DIAGNOSIS — K219 Gastro-esophageal reflux disease without esophagitis: Secondary | ICD-10-CM | POA: Diagnosis not present

## 2021-08-10 DIAGNOSIS — I5030 Unspecified diastolic (congestive) heart failure: Secondary | ICD-10-CM | POA: Diagnosis not present

## 2021-08-10 DIAGNOSIS — D649 Anemia, unspecified: Secondary | ICD-10-CM | POA: Diagnosis not present

## 2021-08-10 DIAGNOSIS — L89892 Pressure ulcer of other site, stage 2: Secondary | ICD-10-CM | POA: Diagnosis not present

## 2021-08-10 DIAGNOSIS — E119 Type 2 diabetes mellitus without complications: Secondary | ICD-10-CM | POA: Diagnosis not present

## 2021-08-10 DIAGNOSIS — I10 Essential (primary) hypertension: Secondary | ICD-10-CM | POA: Diagnosis not present

## 2021-08-10 DIAGNOSIS — I110 Hypertensive heart disease with heart failure: Secondary | ICD-10-CM | POA: Diagnosis not present

## 2021-08-10 NOTE — Telephone Encounter (Signed)
I typed a letter.  Please send.  Thanks.

## 2021-08-10 NOTE — Telephone Encounter (Signed)
Letter has been faxed to [employer].

## 2021-08-11 DIAGNOSIS — J9622 Acute and chronic respiratory failure with hypercapnia: Secondary | ICD-10-CM | POA: Diagnosis not present

## 2021-08-11 DIAGNOSIS — L89892 Pressure ulcer of other site, stage 2: Secondary | ICD-10-CM | POA: Diagnosis not present

## 2021-08-11 DIAGNOSIS — J9621 Acute and chronic respiratory failure with hypoxia: Secondary | ICD-10-CM | POA: Diagnosis not present

## 2021-08-11 DIAGNOSIS — I110 Hypertensive heart disease with heart failure: Secondary | ICD-10-CM | POA: Diagnosis not present

## 2021-08-11 DIAGNOSIS — I5030 Unspecified diastolic (congestive) heart failure: Secondary | ICD-10-CM | POA: Diagnosis not present

## 2021-08-11 DIAGNOSIS — M109 Gout, unspecified: Secondary | ICD-10-CM | POA: Diagnosis not present

## 2021-08-11 DIAGNOSIS — K219 Gastro-esophageal reflux disease without esophagitis: Secondary | ICD-10-CM | POA: Diagnosis not present

## 2021-08-11 DIAGNOSIS — I10 Essential (primary) hypertension: Secondary | ICD-10-CM | POA: Diagnosis not present

## 2021-08-11 DIAGNOSIS — D649 Anemia, unspecified: Secondary | ICD-10-CM | POA: Diagnosis not present

## 2021-08-11 DIAGNOSIS — E119 Type 2 diabetes mellitus without complications: Secondary | ICD-10-CM | POA: Diagnosis not present

## 2021-08-13 ENCOUNTER — Telehealth: Payer: Medicare HMO

## 2021-08-13 DIAGNOSIS — I10 Essential (primary) hypertension: Secondary | ICD-10-CM | POA: Diagnosis not present

## 2021-08-16 DIAGNOSIS — I5030 Unspecified diastolic (congestive) heart failure: Secondary | ICD-10-CM | POA: Diagnosis not present

## 2021-08-16 DIAGNOSIS — F330 Major depressive disorder, recurrent, mild: Secondary | ICD-10-CM | POA: Diagnosis not present

## 2021-08-16 DIAGNOSIS — F419 Anxiety disorder, unspecified: Secondary | ICD-10-CM | POA: Diagnosis not present

## 2021-08-16 DIAGNOSIS — K219 Gastro-esophageal reflux disease without esophagitis: Secondary | ICD-10-CM | POA: Diagnosis not present

## 2021-08-16 DIAGNOSIS — D649 Anemia, unspecified: Secondary | ICD-10-CM | POA: Diagnosis not present

## 2021-08-16 DIAGNOSIS — M109 Gout, unspecified: Secondary | ICD-10-CM | POA: Diagnosis not present

## 2021-08-16 DIAGNOSIS — E119 Type 2 diabetes mellitus without complications: Secondary | ICD-10-CM | POA: Diagnosis not present

## 2021-08-16 DIAGNOSIS — J9622 Acute and chronic respiratory failure with hypercapnia: Secondary | ICD-10-CM | POA: Diagnosis not present

## 2021-08-16 DIAGNOSIS — L89892 Pressure ulcer of other site, stage 2: Secondary | ICD-10-CM | POA: Diagnosis not present

## 2021-08-16 DIAGNOSIS — I10 Essential (primary) hypertension: Secondary | ICD-10-CM | POA: Diagnosis not present

## 2021-08-16 DIAGNOSIS — J9621 Acute and chronic respiratory failure with hypoxia: Secondary | ICD-10-CM | POA: Diagnosis not present

## 2021-08-16 DIAGNOSIS — I110 Hypertensive heart disease with heart failure: Secondary | ICD-10-CM | POA: Diagnosis not present

## 2021-08-17 DIAGNOSIS — I10 Essential (primary) hypertension: Secondary | ICD-10-CM | POA: Diagnosis not present

## 2021-08-18 DIAGNOSIS — E1159 Type 2 diabetes mellitus with other circulatory complications: Secondary | ICD-10-CM

## 2021-08-18 DIAGNOSIS — I5033 Acute on chronic diastolic (congestive) heart failure: Secondary | ICD-10-CM

## 2021-08-18 DIAGNOSIS — I110 Hypertensive heart disease with heart failure: Secondary | ICD-10-CM

## 2021-08-18 DIAGNOSIS — I10 Essential (primary) hypertension: Secondary | ICD-10-CM | POA: Diagnosis not present

## 2021-08-18 DIAGNOSIS — K219 Gastro-esophageal reflux disease without esophagitis: Secondary | ICD-10-CM | POA: Diagnosis not present

## 2021-08-18 DIAGNOSIS — E119 Type 2 diabetes mellitus without complications: Secondary | ICD-10-CM | POA: Diagnosis not present

## 2021-08-18 DIAGNOSIS — J9621 Acute and chronic respiratory failure with hypoxia: Secondary | ICD-10-CM | POA: Diagnosis not present

## 2021-08-18 DIAGNOSIS — I5030 Unspecified diastolic (congestive) heart failure: Secondary | ICD-10-CM | POA: Diagnosis not present

## 2021-08-18 DIAGNOSIS — M109 Gout, unspecified: Secondary | ICD-10-CM | POA: Diagnosis not present

## 2021-08-18 DIAGNOSIS — D649 Anemia, unspecified: Secondary | ICD-10-CM | POA: Diagnosis not present

## 2021-08-18 DIAGNOSIS — J9622 Acute and chronic respiratory failure with hypercapnia: Secondary | ICD-10-CM | POA: Diagnosis not present

## 2021-08-18 DIAGNOSIS — L89892 Pressure ulcer of other site, stage 2: Secondary | ICD-10-CM | POA: Diagnosis not present

## 2021-08-19 ENCOUNTER — Telehealth: Payer: Self-pay

## 2021-08-19 DIAGNOSIS — R32 Unspecified urinary incontinence: Secondary | ICD-10-CM | POA: Diagnosis not present

## 2021-08-19 DIAGNOSIS — I10 Essential (primary) hypertension: Secondary | ICD-10-CM | POA: Diagnosis not present

## 2021-08-19 NOTE — Chronic Care Management (AMB) (Signed)
    Chronic Care Management Pharmacy Assistant   Name: TREG DIEMER  MRN: [id number] DOB: 07-03-1971  Reason for Encounter: Reminder Call   Conditions to be addressed/monitored: HTN and DMII   Medications: Outpatient Encounter Medications as of 08/19/2021  Medication Sig   acetaminophen (TYLENOL) 325 MG tablet Take 2 tablets (650 mg total) by mouth every 6 (six) hours as needed for headache or mild pain.   allopurinol (ZYLOPRIM) 100 MG tablet Take 1 tablet (100 mg total) by mouth daily.   amLODipine (NORVASC) 10 MG tablet Take 1 tablet (10 mg total) by mouth daily.   carvedilol (COREG) 3.125 MG tablet Take 1 tablet (3.125 mg total) by mouth 2 (two) times daily with a meal.   Colchicine (MITIGARE) 0.6 MG CAPS Take 0.5 tablets by mouth daily as needed (for gout.  okay to fill with mitigare).   diclofenac Sodium (VOLTAREN) 1 % GEL Apply 4 g topically 4 (four) times daily.   furosemide (LASIX) 40 MG tablet Take 1 tablet (40 mg total) by mouth daily.   Multiple Vitamins-Minerals (MULTIVITAMIN WITH MINERALS) tablet Take 1 tablet by mouth daily.   naloxone (NARCAN) nasal spray 4 mg/0.1 mL Spray nostril if needed for opioid overuse- decreased consciousness, decreased respirations   oxyCODONE-acetaminophen (PERCOCET) 10-325 MG tablet Take 1 tablet by mouth every 6 (six) hours as needed for pain.   polyethylene glycol (MIRALAX / GLYCOLAX) 17 g packet Take 17 g by mouth daily as needed for moderate constipation.   potassium chloride (KLOR-CON) 10 MEQ tablet Take 1 tablet (10 mEq total) by mouth daily.   TRULICITY 0.75 MG/0.5ML SOPN INJECT 0.75 MG INTO THE SKIN ONCE A WEEK.   vitamin C (ASCORBIC ACID) 500 MG tablet Take 500 mg by mouth 2 (two) times daily.   No facility-administered encounter medications on file as of 08/19/2021.   [name] was contacted to remind him of his upcoming telephone visit with Phil Dopp on 08/23/21 at 11:00am. Patient was reminded to have all medications,  supplements and any blood glucose and blood pressure readings available for review at appointment.   Are you having any problems with your medications? No  Do you have any concerns you like to discuss with the pharmacist? No    Star Rating Drugs: Medication:  Last Fill: Day Supply Trulicity  06/16/21 28  Phil Dopp, CPP notified  Burt Knack, [facility] Clincal Pharmacy Assistant [phone]  Total time spent for month CPA: 10 min.

## 2021-08-20 DIAGNOSIS — I10 Essential (primary) hypertension: Secondary | ICD-10-CM | POA: Diagnosis not present

## 2021-08-21 DIAGNOSIS — I10 Essential (primary) hypertension: Secondary | ICD-10-CM | POA: Diagnosis not present

## 2021-08-22 DIAGNOSIS — I10 Essential (primary) hypertension: Secondary | ICD-10-CM | POA: Diagnosis not present

## 2021-08-23 ENCOUNTER — Other Ambulatory Visit: Payer: Self-pay

## 2021-08-23 ENCOUNTER — Telehealth: Payer: Self-pay

## 2021-08-23 ENCOUNTER — Ambulatory Visit (INDEPENDENT_AMBULATORY_CARE_PROVIDER_SITE_OTHER): Payer: Medicare HMO

## 2021-08-23 DIAGNOSIS — E119 Type 2 diabetes mellitus without complications: Secondary | ICD-10-CM

## 2021-08-23 DIAGNOSIS — I5032 Chronic diastolic (congestive) heart failure: Secondary | ICD-10-CM

## 2021-08-23 DIAGNOSIS — I10 Essential (primary) hypertension: Secondary | ICD-10-CM | POA: Diagnosis not present

## 2021-08-23 MED ORDER — TRULICITY 1.5 MG/0.5ML ~~LOC~~ SOAJ: 1.5000 mg | SUBCUTANEOUS | 1 refills | Status: DC

## 2021-08-23 NOTE — Patient Instructions (Addendum)
Dear [name],  Below is a summary of the goals we discussed during our follow up appointment on August 23, 2021. Please contact me anytime with questions or concerns.   Visit Information Patient Care Plan: CCM Pharmacy Care Plan     Problem Identified: CHL AMB "PATIENT-SPECIFIC PROBLEM"      Long-Range Goal: Disease Management   Start Date: 03/16/2021  This Visit's Progress: On track  Priority: High  Note:   Current Barriers:  No medication concerns identified  Pharmacist Clinical Goal(s):  Patient will contact provider office for questions/concerns as evidenced notation of same in electronic health record through collaboration with PharmD and provider.   Interventions: 1:1 collaboration with Joaquim Nam, MD regarding development and update of comprehensive plan of care as evidenced by provider attestation and co-signature Inter-disciplinary care team collaboration (see longitudinal plan of care) Comprehensive medication review performed; medication list updated in electronic medical record  CHF (Diastolic) and Hypertension (BP goal <140/90) -HTN controlled - per home readings  -CHF symptoms, pt reports left foot swollen past 3-4 days since he accidentally ate pork sausage. No redness, not painful. No abnormal SOB, but does affirm wheezing when he first wakes up. Reviewed labels tries to limit sodium < 2000 mg. Wears BiPAP nightly. -Pt affirms daily adherence to below medications  -Unable to weigh daily as pt is bed bound -Current treatment: Amlodipine 10 mg - 1 tablet daily Carvedilol 3.125 mg - take 1 tablet 2 times daily  Furosemide 40 mg - take 1 tablet daily  Potassium chloride 10 mEq - 1 tablet daily -Medications previously tried: none reported  -Current home readings:  07/29/21                      135/78 07/28/21                        141/80 07/27/21                        135/82  07/26/21                        137/80 Wrist or arm cuff: measured daily by  CNA, wrist cuff Caffeine intake: 8 ounce Sprite zero/day Salt intake: Patient reports he now limits adding salt to foods  OTC medications including pseudoephedrine or NSAIDs?  no -Denies hypotensive/hypertensive symptoms -Continue to monitor BP at home, document, and provide log at future appointments -Recommended to continue current medication; Consult PCP regarding swelling in foot.  Diabetes/Weight Loss (A1c goal <7%) -Controlled - A1c 5.9% (improved) - He states he has tolerated Trulicity well so far. He has noticed decrease appetite. -Current medications: Trulicity 0.75 mg - Inject once weekly (Tuesdays) -Medications previously tried: None reported   -Denies any side effects.  -Current home glucose readings - none reported today -Denies hypoglycemic/hyperglycemic symptoms -Obesity: He will see nutrition and diabetes education (Scote[facility] Alex B) for 6 months with first visit scheduled for 09/16/21 prior to bariatric surgery. He will need transportation for visit. After 6 months and pre-op approval he can schedule bariatric surgery.  -Current exercise: bed bound -Educated on A1c and blood sugar goals; Discussed increasing Trulicity dose. -Recommend consider increasing Trulicity 1.5 mg weekly - [facility]  [employer].   Patient Goals/Self-Care Activities Patient will:  - take medications as prescribed - continue to follow up with plan for bariatric surgery - call CCM  team with any medication questions/concerns  Follow Up Plan: CCM pharmD follow up visit - 30 days      Patient verbalizes understanding of instructions provided today and agrees to view in MyChart.   Phil Dopp, PharmD Clinical Pharmacist Practitioner [facility] [phone]

## 2021-08-23 NOTE — Progress Notes (Signed)
Chronic Care Management Pharmacy Note  08/23/2021 Name:  Michael Payne MRN:  [id number] DOB:  05/08/1971  Subjective: Michael Payne is an 50 y.o. year old male who is a primary patient of Damita Dunnings, Elveria Rising, MD.  The CCM team was consulted for assistance with disease management and care coordination needs.    Engaged with patient by telephone for follow up visit in response to provider referral for pharmacy case management and/or care coordination services.   Consent to Services:  The patient was given information about Chronic Care Management services, agreed to services, and gave verbal consent prior to initiation of services.  Please see initial visit note for detailed documentation.   Patient Care Team: [name], MD as PCP - General (Family Medicine) Deirdre Peer, LCSW as Social Worker (Licensed Clinical Social Worker) Nyoka Cowden, Delice Lesch, RN as Case Manager Chesley Mires, MD as Consulting Physician (Pulmonary Disease) [name], [name], MD as Consulting Physician (Surgery)  Recent office visits: 07/05/21 - PCP - Hospital follow up, video visit. Patient was admitted with acute respiratory failure requiring intubation and ICU management. Started BiPAP. CHF, diuresis required. Continue carvedilol, lasix, amlodipine. Will need to follow up with bariatric. 03/09/21 - PCP - Patient presented to PCP for follow up visit. PCP will look into getting a lift and extra help at home, pulmonology referral and weight loss medication.   Recent consult visits: 08/04/21 - [facility] follow up. Continue Trilogy ventilator. Follow up 6 months. 07/26/21 - Cardiology - Pre-op for bariatric surgery. Continue current medications.   Hospital visits: 05/31/21 - 06/28/21 - Hospital visit - Acute diastolic CHF, respiratory failure. Start furosemide 40 mg daily, potassium chloride 10 mEq daily, and carvedilol 3.125 mg BID.   Objective:  Lab Results  Component Value Date   CREATININE  0.63 (L) 07/26/2021   BUN 12 07/26/2021   GFR 109.18 03/09/2021   GFRNONAA >60 06/23/2021   GFRAA >60 01/03/2020   NA 140 07/26/2021   K 4.3 07/26/2021   CALCIUM 9.2 07/26/2021   CO2 24 07/26/2021   GLUCOSE 101 (H) 07/26/2021   Lab Results  Component Value Date/Time   HGBA1C 5.9 (H) 06/01/2021 12:00 PM   HGBA1C 6.5 03/09/2021 12:32 PM   GFR 109.18 03/09/2021 12:32 PM   GFR 116.29 12/18/2019 05:12 PM    Lab Results  Component Value Date   CHOL 165 03/09/2021   HDL 38.50 (L) 03/09/2021   LDLCALC 101 (H) 03/09/2021   LDLDIRECT 76.0 10/29/2018   TRIG 129.0 03/09/2021   CHOLHDL 4 03/09/2021    Hepatic Function Latest Ref Rng & Units 06/23/2021 06/07/2021 06/01/2021  Total Protein 6.5 - 8.1 g/dL 6.9 7.7 6.7  Albumin 3.5 - 5.0 g/dL 3.2(L) 3.1(L) 3.2(L)  AST 15 - 41 U/L 86(H) 43(H) 28  ALT 0 - 44 U/L 165(H) 53(H) 40  Alk Phosphatase 38 - 126 U/L 44 43 53  Total Bilirubin 0.3 - 1.2 mg/dL 0.5 0.7 1.2    Lab Results  Component Value Date/Time   TSH 4.33 03/09/2021 12:32 PM   TSH 2.63 12/18/2019 05:12 PM    CBC Latest Ref Rng & Units 07/26/2021 06/23/2021 06/14/2021  WBC 3.4 - 10.8 x10E3/uL 7.7 6.3 6.3  Hemoglobin 13.0 - 17.7 g/dL 13.4 12.5(L) 12.1(L)  Hematocrit 37.5 - 51.0 % 42.4 42.8 41.5  Platelets 150 - 450 x10E3/uL 261 234 260    Lab Results  Component Value Date/Time   VD25OH 10.08 (L) 12/18/2019 05:12 PM  Clinical ASCVD: No  The 10-year ASCVD risk score (Arnett DK, et al., 2019) is: 17.2%   Values used to calculate the score:     Age: 72 years     Sex: Male     Is Non-Hispanic African American: Yes     Diabetic: Yes     Tobacco smoker: No     Systolic Blood Pressure: 846 mmHg     Is BP treated: Yes     HDL Cholesterol: 38.5 mg/dL     Total Cholesterol: 165 mg/dL    Depression screen [facility] 2/9 05/15/2021 04/06/2021 02/11/2021  Decreased Interest 0 0 0  Down, Depressed, Hopeless 0 0 0  PHQ - 2 Score 0 0 0  Altered sleeping 0 0 -  Tired, decreased energy 0 0 -   Change in appetite 0 0 -  Feeling bad or failure about yourself  0 0 -  Trouble concentrating 0 0 -  Moving slowly or fidgety/restless 0 0 -  Suicidal thoughts 0 0 -  PHQ-9 Score 0 0 -  Difficult doing work/chores Not difficult at all Not difficult at all -    Social History   Tobacco Use  Smoking Status Former   Packs/day: 2.00   Years: 22.00   Pack years: 44.00   Types: Cigarettes   Quit date: 02/24/2011   Years since quitting: 10.5  Smokeless Tobacco Never   BP Readings from Last 3 Encounters:  08/04/21 135/78  07/26/21 (!) 148/95  06/28/21 (!) 145/80   Pulse Readings from Last 3 Encounters:  07/26/21 95  07/05/21 81  06/28/21 80   Wt Readings from Last 3 Encounters:  06/18/21 (!) 595 lb 3.9 oz (270 kg)  11/05/20 (!) 490 lb (222.3 kg)  09/02/20 (!) 490 lb (222.3 kg)   BMI Readings from Last 3 Encounters:  07/26/21 72.46 kg/m  07/05/21 83.02 kg/m  06/18/21 83.02 kg/m    Assessment/Interventions: Review of patient past medical history, allergies, medications, health status, including review of consultants reports, laboratory and other test data, was performed as part of comprehensive evaluation and provision of chronic care management services.   SDOH:  (Social Determinants of Health) assessments and interventions performed: Yes   SDOH Screenings   Alcohol Screen: Low Risk    Last Alcohol Screening Score (AUDIT): 0  Depression (PHQ2-9): Low Risk    PHQ-2 Score: 0  Financial Resource Strain: Low Risk    Difficulty of Paying Living Expenses: Not hard at all  Food Insecurity: No Food Insecurity   Worried About Charity fundraiser in the Last Year: Never true   Ran Out of Food in the Last Year: Never true  Housing: [place] Risk Score: 0  Physical Activity: Inactive   Days of Exercise per Week: 0 days   Minutes of Exercise per Session: 0 min  Social Connections: Unknown   Frequency of Communication with Friends and Family: Twice a week    Frequency of Social Gatherings with Friends and Family: Once a week   Attends Religious Services: Never   Marine scientist or Organizations: No   Attends Archivist Meetings: Never   Marital Status: Not on file  Stress: Stress Concern Present   Feeling of Stress : To some extent  Tobacco Use: Medium Risk   Smoking Tobacco Use: Former   Smokeless Tobacco Use: Never   Passive Exposure: Not on file  Transportation Needs: Unmet Transportation Needs   Lack of Transportation (  Medical): Yes   Lack of Transportation (Non-Medical): Yes    CCM Care Plan  Allergies  Allergen Reactions   Aspirin Other (See Comments)    Upset stomach   Lactose Intolerance (Gi) Nausea And Vomiting   Lisinopril     Causes cough    Medications Reviewed Today     Reviewed by Dannielle Karvonen, RN (Registered Nurse) on 08/06/21 at 1101  Med List Status: <None>   Medication Order Taking? Sig Documenting Provider Last Dose Status Informant  acetaminophen (TYLENOL) 325 MG tablet [id number] No Take 2 tablets (650 mg total) by mouth every 6 (six) hours as needed for headache or mild pain. Samella Parr, NP Taking Active   allopurinol (ZYLOPRIM) 100 MG tablet [id number] No Take 1 tablet (100 mg total) by mouth daily. [name], MD Taking Active   amLODipine (NORVASC) 10 MG tablet [id number] No Take 1 tablet (10 mg total) by mouth daily. [name], MD Taking Active   carvedilol (COREG) 3.125 MG tablet [id number] No Take 1 tablet (3.125 mg total) by mouth 2 (two) times daily with a meal. [name], MD Taking Active   Colchicine (MITIGARE) 0.6 MG CAPS [id number] No Take 0.5 tablets by mouth daily as needed (for gout.  okay to fill with mitigare). [name], MD Taking Active   diclofenac Sodium (VOLTAREN) 1 % GEL [id number] No Apply 4 g topically 4 (four) times daily. Samella Parr, NP Taking Active   furosemide (LASIX) 40 MG tablet [id number] No Take 1 tablet (40 mg total) by mouth  daily. [name], MD Taking Active   Multiple Vitamins-Minerals (MULTIVITAMIN WITH MINERALS) tablet [id number] No Take 1 tablet by mouth daily. [provider] Taking Active   naloxone [facility]) nasal spray 4 mg/0.1 mL [id number] No Spray nostril if needed for opioid overuse- decreased consciousness, decreased respirations Elby Beck, FNP Taking Active Self  oxyCODONE-acetaminophen (PERCOCET) 10-325 MG tablet [id number] No Take 1 tablet by mouth every 6 (six) hours as needed for pain. [name], MD Taking Active   polyethylene glycol (MIRALAX / GLYCOLAX) 17 g packet [id number] No Take 17 g by mouth daily as needed for moderate constipation. Arrien, Jimmy Picket, MD Taking Active   potassium chloride (KLOR-CON) 10 MEQ tablet [id number] No Take 1 tablet (10 mEq total) by mouth daily. [name], MD Taking Active   TRULICITY 8.32 PQ/9.8YM [place] Aid [id number] No INJECT 0.75 MG INTO THE SKIN ONCE A WEEK. [name], MD Taking Active   vitamin C (ASCORBIC ACID) 500 MG tablet [id number] No Take 500 mg by mouth 2 (two) times daily. [provider] Taking Active             Patient Active Problem List   Diagnosis Date Noted   Nonobstructive transaminitis 06/28/2021   Decubitus ulcer of posterior thigh, stage 2 ([place]) 06/28/2021   Acute kidney injury (resolved) 06/28/2021   Pre-diabetes 06/10/2021   Obesity, Class III, BMI 40-49.9 (morbid obesity) ([place]) 06/10/2021   Osteoarthritis 06/10/2021   Pressure injury of skin 06/09/2021   Respiratory failure ([place]) 31/59/4585   Acute diastolic CHF (congestive heart failure) ([place]) 05/31/2021   CHF (congestive heart failure) ([place]) 05/31/2021   Hypoxia 05/31/2021   Gout, unspecified 03/10/2021   Edema 03/10/2021   Diabetes mellitus without complication ([place]) 92/92/4462   Muscle spasms of both lower extremities 07/29/2020   Bedbound 05/22/2020   Unilateral osteoarthritis of hip, right 12/27/2019   Severe  obstructive sleep apnea  02/22/2018   Hypertriglyceridemia 08/24/2017   Low HDL (under 40) 88/41/6606   Metabolic syndrome 30/16/0109   Morbid obesity ([place]) 10/14/2013   HTN (hypertension) 10/14/2013    Immunization History  Administered Date(s) Administered   Tdap 02/03/2012    Conditions to be addressed/monitored:  Hypertension, Diabetes, and Heart Failure  Care Plan : [place]  Updates made by Debbora Dus, [place] since 08/23/2021 12:00 AM     Problem: CHL AMB "PATIENT-SPECIFIC PROBLEM"      Long-Range Goal: Disease Management   Start Date: 03/16/2021  This Visit's Progress: On track  Priority: High  Note:   Current Barriers:  No medication concerns identified  Pharmacist Clinical Goal(s):  Patient will contact provider office for questions/concerns as evidenced notation of same in electronic health record through collaboration with PharmD and provider.   Interventions: 1:1 collaboration with [name], MD regarding development and update of comprehensive plan of care as evidenced by provider attestation and co-signature Inter-disciplinary care team collaboration (see longitudinal plan of care) Comprehensive medication review performed; medication list updated in electronic medical record  CHF (Diastolic) and Hypertension (BP goal <140/90) -HTN controlled - per home readings  -CHF symptoms, pt reports left foot swollen past 3-4 days since he accidentally ate pork sausage. No redness, not painful. No abnormal SOB, but does affirm wheezing when he first wakes up. Reviewed labels tries to limit sodium < 2000 mg. Wears BiPAP nightly. -Pt affirms daily adherence to below medications  -Unable to weigh daily as pt is bed bound -Current treatment: Amlodipine 10 mg - 1 tablet daily Carvedilol 3.125 mg - take 1 tablet 2 times daily  Furosemide 40 mg - take 1 tablet daily  Potassium chloride 10 mEq - 1 tablet daily -Medications previously tried: none  reported  -Current home readings:  07/29/21                      135/78 07/28/21                        141/80 07/27/21                        135/82  07/26/21                        137/80 Wrist or arm cuff: measured daily by CNA, wrist cuff Caffeine intake: 8 ounce Sprite zero/day Salt intake: Patient reports he now limits adding salt to foods  OTC medications including pseudoephedrine or NSAIDs?  no -Denies hypotensive/hypertensive symptoms -Continue to monitor BP at home, document, and provide log at future appointments -Recommended to continue current medication; Consult PCP regarding swelling in foot.  Diabetes/Weight Loss (A1c goal <7%) -Controlled - A1c 5.9% (improved) - He states he has tolerated Trulicity well so far. He has noticed decrease appetite. -Current medications: Trulicity 3.23 mg - Inject once weekly (Tuesdays) -Medications previously tried: None reported   -Denies any side effects.  -Current home glucose readings - none reported today -Denies hypoglycemic/hyperglycemic symptoms -Obesity: He will see nutrition and diabetes education ([facility] Alex B) for 6 months with first visit scheduled for 09/16/21 prior to bariatric surgery. He will need transportation for visit. After 6 months and pre-op approval he can schedule bariatric surgery.  -Current exercise: bed bound -Educated on A1c and blood sugar goals; Discussed increasing Trulicity dose. -Recommend consider increasing Trulicity 1.5 mg weekly - [facility]   Patient Goals/Self-Care Activities Patient will:  - take medications as prescribed - continue to follow up with plan for bariatric surgery - call CCM team with any medication questions/concerns  Follow Up Plan: CCM pharmD follow up visit - 30 days (will have Charlene Brooke follow up for the next couple months for Trulicity titration)    Medication Assistance: None required.  Patient affirms current coverage meets  needs.  Compliance/Adherence/Medication fill history: Care Gaps: Diabetes eye and foot exam due  Star-Rating Drugs: Medication:                Last Fill:         Day Supply Trulicity 6.38 mg          06/16/21            28 (Mondays) - past due  Amlodipine 62m        06/29/21          90 Carvedilol 3.1237m    07/29/21          30 Furosemide 4074m     07/29/21          30  -Pt is past due for some refills due to extended hospital stay in Sept 2022.  Patient[facility] pharmacy [facility]:  [id number]: [phone]: [phone] -[id number]: [phone]: [phone] pill box? Yes - he fills a 7 day pillbox weekly  Pt endorses 100% compliance  Care Plan and Follow Up Patient Decision:  Patient agrees to Care Plan and Follow-up.  MichDebbora DusarmD Clinical Pharmacist [facility] at [phone]

## 2021-08-23 NOTE — Telephone Encounter (Signed)
Patient needs transportation for nutrition visit 09/16/21, 10:45 AM. Will refer to care guide.

## 2021-08-23 NOTE — Telephone Encounter (Signed)
Thank you for your help.  I think it makes sense to take an extra dose of lasix today and elevate his foot as much as possible.  Would go back to routine lasix dosing starting tomorrow. If that doesn't help with the swelling, then let us know.    And I think it makes sense to inc trulicity to 1.5mg  weekly.  Please let me know if I need to send in a new rx.    Thanks.

## 2021-08-23 NOTE — Telephone Encounter (Signed)
Pt notified and confirms understanding. He will update [place] if no improvement. Escribed Trulicity dose increase - 1.5 mg weekly. Pt will double up on the 0.75 mg for now as he has excess supply.  Phil Dopp, PharmD Clinical Pharmacist Practitioner [facility] at [facility] [phone]

## 2021-08-23 NOTE — Telephone Encounter (Signed)
   Telephone encounter was:  Successful.  08/23/2021 Name: Michael Payne MRN: [id number] DOB: 05-10-1971  Michael Payne is a 50 y.o. year old male who is a primary care patient of Joaquim Nam, MD . The community resource team was consulted for assistance with Transportation Needs   Care guide performed the following interventions:  Pt advised PTAR is still needed due to weight. He has not had his weight loss surgery just yet. PTAR advised to call the week before appt (12/19 week) or 2-3 days before the 12/29th appt. Pt has been advised as well.  Follow Up Plan:  Care guide will follow up with patient by phone over the next few days. The week of the 19th - 12/27.   [facility] Guide, Embedded Care Coordination [facility]  [place], [place] [address]  Main Phone: [phone]  E-mail: [email or web address]  Website: [email or web address]

## 2021-08-23 NOTE — Telephone Encounter (Signed)
Hi Dr. Para March, I have 2 questions from CCM visit today, #1 is urgent.   Pt reports he ate a high salt meal 3 days ago and his left foot has been swollen since. States this looks similar to the past before his hospitalization for CHF. Denies SOB and affirms taking his Lasix 40 mg daily in morning. Denies pain or redness in foot. He is unable to check weights due to bed bound. Please advise.  Pt has been tolerating Trulicity 0.75 mg weekly for several months. OK to increase to the 1.5 mg dose for further weight loss benefit?  Michael Payne, PharmD Clinical Pharmacist Practitioner [place] [facility] at [facility] [phone]

## 2021-08-24 DIAGNOSIS — I10 Essential (primary) hypertension: Secondary | ICD-10-CM | POA: Diagnosis not present

## 2021-08-25 DIAGNOSIS — D649 Anemia, unspecified: Secondary | ICD-10-CM | POA: Diagnosis not present

## 2021-08-25 DIAGNOSIS — J9610 Chronic respiratory failure, unspecified whether with hypoxia or hypercapnia: Secondary | ICD-10-CM | POA: Diagnosis not present

## 2021-08-25 DIAGNOSIS — E119 Type 2 diabetes mellitus without complications: Secondary | ICD-10-CM | POA: Diagnosis not present

## 2021-08-25 DIAGNOSIS — L89892 Pressure ulcer of other site, stage 2: Secondary | ICD-10-CM | POA: Diagnosis not present

## 2021-08-25 DIAGNOSIS — M109 Gout, unspecified: Secondary | ICD-10-CM | POA: Diagnosis not present

## 2021-08-25 DIAGNOSIS — J9621 Acute and chronic respiratory failure with hypoxia: Secondary | ICD-10-CM | POA: Diagnosis not present

## 2021-08-25 DIAGNOSIS — F419 Anxiety disorder, unspecified: Secondary | ICD-10-CM | POA: Diagnosis not present

## 2021-08-25 DIAGNOSIS — I5030 Unspecified diastolic (congestive) heart failure: Secondary | ICD-10-CM | POA: Diagnosis not present

## 2021-08-25 DIAGNOSIS — I110 Hypertensive heart disease with heart failure: Secondary | ICD-10-CM | POA: Diagnosis not present

## 2021-08-25 DIAGNOSIS — E662 Morbid (severe) obesity with alveolar hypoventilation: Secondary | ICD-10-CM | POA: Diagnosis not present

## 2021-08-25 DIAGNOSIS — J9622 Acute and chronic respiratory failure with hypercapnia: Secondary | ICD-10-CM | POA: Diagnosis not present

## 2021-08-25 DIAGNOSIS — K219 Gastro-esophageal reflux disease without esophagitis: Secondary | ICD-10-CM | POA: Diagnosis not present

## 2021-08-25 DIAGNOSIS — F330 Major depressive disorder, recurrent, mild: Secondary | ICD-10-CM | POA: Diagnosis not present

## 2021-08-25 DIAGNOSIS — I10 Essential (primary) hypertension: Secondary | ICD-10-CM | POA: Diagnosis not present

## 2021-08-26 ENCOUNTER — Telehealth: Payer: Self-pay | Admitting: Family Medicine

## 2021-08-26 DIAGNOSIS — K219 Gastro-esophageal reflux disease without esophagitis: Secondary | ICD-10-CM | POA: Diagnosis not present

## 2021-08-26 DIAGNOSIS — I110 Hypertensive heart disease with heart failure: Secondary | ICD-10-CM | POA: Diagnosis not present

## 2021-08-26 DIAGNOSIS — E119 Type 2 diabetes mellitus without complications: Secondary | ICD-10-CM | POA: Diagnosis not present

## 2021-08-26 DIAGNOSIS — I5030 Unspecified diastolic (congestive) heart failure: Secondary | ICD-10-CM | POA: Diagnosis not present

## 2021-08-26 DIAGNOSIS — I10 Essential (primary) hypertension: Secondary | ICD-10-CM | POA: Diagnosis not present

## 2021-08-26 DIAGNOSIS — L89892 Pressure ulcer of other site, stage 2: Secondary | ICD-10-CM | POA: Diagnosis not present

## 2021-08-26 DIAGNOSIS — J9622 Acute and chronic respiratory failure with hypercapnia: Secondary | ICD-10-CM | POA: Diagnosis not present

## 2021-08-26 DIAGNOSIS — M1611 Unilateral primary osteoarthritis, right hip: Secondary | ICD-10-CM

## 2021-08-26 DIAGNOSIS — J9621 Acute and chronic respiratory failure with hypoxia: Secondary | ICD-10-CM | POA: Diagnosis not present

## 2021-08-26 DIAGNOSIS — D649 Anemia, unspecified: Secondary | ICD-10-CM | POA: Diagnosis not present

## 2021-08-26 DIAGNOSIS — M109 Gout, unspecified: Secondary | ICD-10-CM | POA: Diagnosis not present

## 2021-08-26 NOTE — Telephone Encounter (Signed)
LOV - 07/05/21 NOV - not schedule RF - 07/28/21 #120/0

## 2021-08-26 NOTE — Telephone Encounter (Signed)
Home Health verbal orders Caller Name:Kate White Agency Name: [facility]  Callback number: [phone]  Requesting OT/PT/Skilled nursing/Social Work/Speech:  Reason:PT  Frequency:2 wks for 9 wks  Please forward to [facility] pool or providers CMA

## 2021-08-26 NOTE — Telephone Encounter (Signed)
  Encourage patient to contact the pharmacy for refills or they can request refills through [facility]  LAST APPOINTMENT DATE:  Please schedule appointment if longer than 1 year  NEXT APPOINTMENT DATE:  MEDICATION:oxyCODONE-acetaminophen (PERCOCET) 10-325 MG tablet   Is the patient out of medication?   [id number] [address], [place] [address]   Let patient know to contact pharmacy at the end of the day to make sure medication is ready.  Please notify patient to allow 48-72 hours to process  CLINICAL FILLS OUT ALL BELOW:   LAST REFILL:  QTY:  REFILL DATE:    OTHER COMMENTS:    Okay for refill?  Please advise

## 2021-08-27 DIAGNOSIS — I10 Essential (primary) hypertension: Secondary | ICD-10-CM | POA: Diagnosis not present

## 2021-08-27 MED ORDER — OXYCODONE-ACETAMINOPHEN 10-325 MG PO TABS: 1.0000 | ORAL_TABLET | Freq: Four times a day (QID) | ORAL | 0 refills | Status: DC | PRN

## 2021-08-27 NOTE — Telephone Encounter (Signed)
Please give the order.  Thanks.   

## 2021-08-27 NOTE — Telephone Encounter (Signed)
Sent. Thanks.   

## 2021-08-27 NOTE — Telephone Encounter (Signed)
Pt called in to know status of refills stated he out of medication .

## 2021-08-27 NOTE — Telephone Encounter (Signed)
Patient notified rx was sent to pharmacy

## 2021-08-27 NOTE — Addendum Note (Signed)
Addended by: Joaquim Nam on: 08/27/2021 09:40 AM   Modules accepted: Orders

## 2021-08-27 NOTE — Telephone Encounter (Signed)
Called and left message on secure VM with verbal orders.  

## 2021-08-30 DIAGNOSIS — J9621 Acute and chronic respiratory failure with hypoxia: Secondary | ICD-10-CM | POA: Diagnosis not present

## 2021-08-30 DIAGNOSIS — E119 Type 2 diabetes mellitus without complications: Secondary | ICD-10-CM | POA: Diagnosis not present

## 2021-08-30 DIAGNOSIS — D649 Anemia, unspecified: Secondary | ICD-10-CM | POA: Diagnosis not present

## 2021-08-30 DIAGNOSIS — M1611 Unilateral primary osteoarthritis, right hip: Secondary | ICD-10-CM | POA: Diagnosis not present

## 2021-08-30 DIAGNOSIS — I5031 Acute diastolic (congestive) heart failure: Secondary | ICD-10-CM | POA: Diagnosis not present

## 2021-08-30 DIAGNOSIS — M109 Gout, unspecified: Secondary | ICD-10-CM | POA: Diagnosis not present

## 2021-08-30 DIAGNOSIS — J9622 Acute and chronic respiratory failure with hypercapnia: Secondary | ICD-10-CM | POA: Diagnosis not present

## 2021-08-30 DIAGNOSIS — I10 Essential (primary) hypertension: Secondary | ICD-10-CM | POA: Diagnosis not present

## 2021-08-30 DIAGNOSIS — K219 Gastro-esophageal reflux disease without esophagitis: Secondary | ICD-10-CM | POA: Diagnosis not present

## 2021-08-30 DIAGNOSIS — I110 Hypertensive heart disease with heart failure: Secondary | ICD-10-CM | POA: Diagnosis not present

## 2021-08-31 ENCOUNTER — Telehealth: Payer: Medicare HMO

## 2021-08-31 ENCOUNTER — Ambulatory Visit: Payer: Medicare HMO | Admitting: *Deleted

## 2021-08-31 DIAGNOSIS — I110 Hypertensive heart disease with heart failure: Secondary | ICD-10-CM | POA: Diagnosis not present

## 2021-08-31 DIAGNOSIS — F339 Major depressive disorder, recurrent, unspecified: Secondary | ICD-10-CM

## 2021-08-31 DIAGNOSIS — E119 Type 2 diabetes mellitus without complications: Secondary | ICD-10-CM

## 2021-08-31 DIAGNOSIS — J9622 Acute and chronic respiratory failure with hypercapnia: Secondary | ICD-10-CM | POA: Diagnosis not present

## 2021-08-31 DIAGNOSIS — J9621 Acute and chronic respiratory failure with hypoxia: Secondary | ICD-10-CM | POA: Diagnosis not present

## 2021-08-31 DIAGNOSIS — I5031 Acute diastolic (congestive) heart failure: Secondary | ICD-10-CM | POA: Diagnosis not present

## 2021-08-31 DIAGNOSIS — K219 Gastro-esophageal reflux disease without esophagitis: Secondary | ICD-10-CM | POA: Diagnosis not present

## 2021-08-31 DIAGNOSIS — D649 Anemia, unspecified: Secondary | ICD-10-CM | POA: Diagnosis not present

## 2021-08-31 DIAGNOSIS — M1611 Unilateral primary osteoarthritis, right hip: Secondary | ICD-10-CM | POA: Diagnosis not present

## 2021-08-31 DIAGNOSIS — M109 Gout, unspecified: Secondary | ICD-10-CM | POA: Diagnosis not present

## 2021-08-31 DIAGNOSIS — I10 Essential (primary) hypertension: Secondary | ICD-10-CM | POA: Diagnosis not present

## 2021-08-31 DIAGNOSIS — Z7401 Bed confinement status: Secondary | ICD-10-CM

## 2021-08-31 NOTE — Patient Instructions (Signed)
Visit Information  Thank you for taking time to visit with me today. Please don't hesitate to contact me if I can be of assistance to you before our next scheduled telephone appointment.  Following are the goals we discussed today:  (Copy and paste patient goals from clinical care plan here)  Our next appointment is by telephone on 09/26/21 at [facility]  Please call the care guide team at [phone] if you need to cancel or reschedule your appointment.   If you are experiencing a Mental Health or Behavioral Health Crisis or need someone to talk to, please call the Suicide and Crisis Lifeline: 988 call the [place] National Suicide Prevention Lifeline: (478) 739-8334 or TTY: 607 127 6555 TTY 714-544-8366) to talk to a trained counselor call 1-800-273-TALK (toll free, 24 hour hotline) go to [facility] [address] [place] [phone]) call 911   The patient verbalized understanding of instructions, educational materials, and care plan provided today and declined offer to receive copy of patient instructions, educational materials, and care plan.   Reece Levy MSW, LCSW Licensed Clinical Social Worker [facility] [place] [phone]

## 2021-08-31 NOTE — Chronic Care Management (AMB) (Signed)
Chronic Care Management    Clinical Social Work Note  08/31/2021 Name: Michael Payne MRN: [id number] DOB: July 31, 1971  Michael Payne is a 50 y.o. year old male who is a primary care patient of Michael March Dwana Curd, MD. The CCM team was consulted to assist the patient with chronic disease management and/or care coordination needs related to: [employer]  and Mental Health Counseling and Resources.   Engaged with patient by telephone for follow up visit in response to provider referral for social work chronic care management and care coordination services.   Consent to Services:  The patient was given information about Chronic Care Management services, agreed to services, and gave verbal consent prior to initiation of services.  Please see initial visit note for detailed documentation.   Patient agreed to services and consent obtained.   Assessment: Review of patient past medical history, allergies, medications, and health status, including review of relevant consultants reports was performed today as part of a comprehensive evaluation and provision of chronic care management and care coordination services.     SDOH (Social Determinants of Health) assessments and interventions performed:    Advanced Directives Status: Not addressed in this encounter.  CCM Care Plan  Allergies  Allergen Reactions   Aspirin Other (See Comments)    Upset stomach   Lactose Intolerance (Gi) Nausea And Vomiting   Lisinopril     Causes cough    Outpatient Encounter Medications as of 08/31/2021  Medication Sig   acetaminophen (TYLENOL) 325 MG tablet Take 2 tablets (650 mg total) by mouth every 6 (six) hours as needed for headache or mild pain.   allopurinol (ZYLOPRIM) 100 MG tablet Take 1 tablet (100 mg total) by mouth daily.   amLODipine (NORVASC) 10 MG tablet Take 1 tablet (10 mg total) by mouth daily.   carvedilol (COREG) 3.125 MG tablet Take 1 tablet (3.125 mg total) by mouth 2 (two) times daily  with a meal.   Colchicine (MITIGARE) 0.6 MG CAPS Take 0.5 tablets by mouth daily as needed (for gout.  okay to fill with mitigare).   diclofenac Sodium (VOLTAREN) 1 % GEL Apply 4 g topically 4 (four) times daily.   Dulaglutide (TRULICITY) 1.5 MG/0.5ML SOPN Inject 1.5 mg into the skin once a week.   furosemide (LASIX) 40 MG tablet Take 1 tablet (40 mg total) by mouth daily.   Multiple Vitamins-Minerals (MULTIVITAMIN WITH MINERALS) tablet Take 1 tablet by mouth daily.   naloxone (NARCAN) nasal spray 4 mg/0.1 mL Spray nostril if needed for opioid overuse- decreased consciousness, decreased respirations   oxyCODONE-acetaminophen (PERCOCET) 10-325 MG tablet Take 1 tablet by mouth every 6 (six) hours as needed for pain.   potassium chloride (KLOR-CON) 10 MEQ tablet Take 1 tablet (10 mEq total) by mouth daily.   vitamin C (ASCORBIC ACID) 500 MG tablet Take 500 mg by mouth daily.   No facility-administered encounter medications on file as of 08/31/2021.    Patient Active Problem List   Diagnosis Date Noted   Nonobstructive transaminitis 06/28/2021   Decubitus ulcer of posterior thigh, stage 2 (HCC) 06/28/2021   Acute kidney injury (resolved) 06/28/2021   Pre-diabetes 06/10/2021   Obesity, Class III, BMI 40-49.9 (morbid obesity) (HCC) 06/10/2021   Osteoarthritis 06/10/2021   Pressure injury of skin 06/09/2021   Respiratory failure (HCC) 06/01/2021   Acute diastolic CHF (congestive heart failure) (HCC) 05/31/2021   CHF (congestive heart failure) (HCC) 05/31/2021   Hypoxia 05/31/2021   Gout, unspecified 03/10/2021   Edema  03/10/2021   Diabetes mellitus without complication (HCC) 03/10/2021   Muscle spasms of both lower extremities 07/29/2020   Bedbound 05/22/2020   Unilateral osteoarthritis of hip, right 12/27/2019   Severe obstructive sleep apnea 02/22/2018   Hypertriglyceridemia 08/24/2017   Low HDL (under 40) 08/24/2017   Metabolic syndrome 08/24/2017   Morbid obesity (HCC) 10/14/2013    HTN (hypertension) 10/14/2013    Conditions to be addressed/monitored: Depression; Mental Health Concerns   Care Plan : LCSW Plan of Care  Updates made by Michael Mam, LCSW since 08/31/2021 12:00 AM     Problem: Social and Functional Symptoms      Long-Range Goal: Optimize pt's quality of life and opportunities   Start Date: 01/25/2021  Expected End Date: 10/18/2021  This Visit's Progress: On track  Recent Progress: On track  Priority: High  Note:   Current barriers:   Transportation, Housing barriers, Level of care concerns, ADL IADL limitations, Social Isolation, Limited access to caregiver, Inability to perform ADL's independently, Inability to perform IADL's independently, and Lacks knowledge of community resource Clinical Goals: Patient will work with CSW  to address needs related to transportation, PCS/CAPS and other needs as assessed and identified Clinical Interventions:  08/31/21- spoke with pt who was in good spirits- states he continues to work with PT and is progressing, "I am able to sit on edge of bed myself and hope to soon be able to stand". Support and encouragement offered- he is also scheduled to have a counseling session later this month and has PCS care 2.45 hours daily, 7 days per week.  His daughter works from home and is able to help him some also- they are considering housing options (in a large rental).   07/21/21: Received call from Lasandra Beech, LCSW/Heart Care, indicating pt was in need of transportation to see a Cardiologist for surgical clearance. CSW advised and provided the steps to seek insurance waiver/auth for this.   07/06/21- Pt reports being released from hospital back to home where he has family assistance. Per pt, he is awaiting Home Health visits as well as his PCS care to be resumed. He states he is on the waiting list for CAPS.  Pt continues to seek weight loss surgery and states "I have a bunch of stuff to do".    CSW offered  encouragement with all his goals.   date of comprehensive plan of care as evidenced by provider attestation and co-signature Inter-disciplinary care team collaboration (see longitudinal plan of care) Assessment of needs, barriers , agencies contacted, as well as how impacting  Review various resources, discussed options and provided patient information about Department of Social Services (food stamps, [employer], and utilities assistance), Transportation provided by insurance provider, Enhanced Benefits connected with insurance provider, Referral to care guide for community support and other options, SCAT transportation, and [employer] Alternative Program CAP Patient interviewed and appropriate assessments performed Referred patient to community resources care guide team for assistance with ramps (building/ex[expense,etc)  Provided mental health counseling with regard to pt's current isolation and being homebound/bedbound Provided patient with information about SCAT, CAPS, transportation resources Discussed plans with patient for ongoing care management follow up and provided patient with direct contact information for care management team Advised patient to call DSS worker to request CAPS services Advised patient to complete PCS application/request for increase in hours provided Collaborated with primary care provider re: insurance prior auth process for stretcher transportation Assisted patient/caregiver with obtaining information about health plan benefits Other interventions provided:Solution-Focused Strategies,  Active listening / Reflection utilized , Emotional Supportive Provided, Problem Solving /Task Center , Psychoeducation /Health Education, Motivational Interviewing, Brief CBT , Increase in activities / exercise encouraged (with HHPT), and Provided basic mental health support, education   Patient Goals/Self-Care Activities: Over the next 30 days  Follow Up Date 08/13/21   - keep 90 percent  of counseling appointments - schedule counseling appointment  -follow up with PCS office re: staffing of hours, etc.  -follow up with Bariatric Coordinator for next steps  - begin a notebook of services in my neighborhood or community - call 211 when I need some help - follow-up on any referrals for help I am given - think ahead to make sure my need does not become an emergency - make a note about what I need to have by the phone or take with me, like an identification card or social security number have a back-up plan - have a back-up plan - make a list of family or friends that I can call -continue working with Home Health PT   -continue with  virtual  counseling - keep 90 percent of counseling appointments - schedule counseling appointment  -follow up with PCS office re: staffing of hours, etc.  -follow up with Bariatric Coordinator for next steps  - begin a notebook of services in my neighborhood or community - call 211 when I need some help - follow-up on any referrals for help I am given - think ahead to make sure my need does not become an emergency - make a note about what I need to have by the phone or take with me, like an identification card or social security number have a back-up plan - have a back-up plan - make a list of family or friends that I can call -continue working with Home Health PT      Follow Up Plan: Appointment scheduled for SW follow up with client by phone on: 09/26/21      Reece Levy MSW, LCSW Licensed Clinical Social Worker [facility] [place] [phone]

## 2021-09-01 ENCOUNTER — Telehealth: Payer: Self-pay

## 2021-09-01 DIAGNOSIS — I10 Essential (primary) hypertension: Secondary | ICD-10-CM | POA: Diagnosis not present

## 2021-09-01 DIAGNOSIS — M1611 Unilateral primary osteoarthritis, right hip: Secondary | ICD-10-CM | POA: Diagnosis not present

## 2021-09-01 DIAGNOSIS — I110 Hypertensive heart disease with heart failure: Secondary | ICD-10-CM | POA: Diagnosis not present

## 2021-09-01 DIAGNOSIS — D649 Anemia, unspecified: Secondary | ICD-10-CM | POA: Diagnosis not present

## 2021-09-01 DIAGNOSIS — E119 Type 2 diabetes mellitus without complications: Secondary | ICD-10-CM | POA: Diagnosis not present

## 2021-09-01 DIAGNOSIS — J9622 Acute and chronic respiratory failure with hypercapnia: Secondary | ICD-10-CM | POA: Diagnosis not present

## 2021-09-01 DIAGNOSIS — I5031 Acute diastolic (congestive) heart failure: Secondary | ICD-10-CM | POA: Diagnosis not present

## 2021-09-01 DIAGNOSIS — K219 Gastro-esophageal reflux disease without esophagitis: Secondary | ICD-10-CM | POA: Diagnosis not present

## 2021-09-01 DIAGNOSIS — M109 Gout, unspecified: Secondary | ICD-10-CM | POA: Diagnosis not present

## 2021-09-01 DIAGNOSIS — J9621 Acute and chronic respiratory failure with hypoxia: Secondary | ICD-10-CM | POA: Diagnosis not present

## 2021-09-01 NOTE — Telephone Encounter (Signed)
Call made to conduct screening for transportation needs. Was able to reach patient but he shared that he was busy and requested we call back at another time.

## 2021-09-02 DIAGNOSIS — I10 Essential (primary) hypertension: Secondary | ICD-10-CM | POA: Diagnosis not present

## 2021-09-03 DIAGNOSIS — E119 Type 2 diabetes mellitus without complications: Secondary | ICD-10-CM | POA: Diagnosis not present

## 2021-09-03 DIAGNOSIS — K219 Gastro-esophageal reflux disease without esophagitis: Secondary | ICD-10-CM | POA: Diagnosis not present

## 2021-09-03 DIAGNOSIS — M1611 Unilateral primary osteoarthritis, right hip: Secondary | ICD-10-CM | POA: Diagnosis not present

## 2021-09-03 DIAGNOSIS — I10 Essential (primary) hypertension: Secondary | ICD-10-CM | POA: Diagnosis not present

## 2021-09-03 DIAGNOSIS — M109 Gout, unspecified: Secondary | ICD-10-CM | POA: Diagnosis not present

## 2021-09-03 DIAGNOSIS — I5031 Acute diastolic (congestive) heart failure: Secondary | ICD-10-CM | POA: Diagnosis not present

## 2021-09-03 DIAGNOSIS — D649 Anemia, unspecified: Secondary | ICD-10-CM | POA: Diagnosis not present

## 2021-09-03 DIAGNOSIS — J9622 Acute and chronic respiratory failure with hypercapnia: Secondary | ICD-10-CM | POA: Diagnosis not present

## 2021-09-03 DIAGNOSIS — J9621 Acute and chronic respiratory failure with hypoxia: Secondary | ICD-10-CM | POA: Diagnosis not present

## 2021-09-03 DIAGNOSIS — I110 Hypertensive heart disease with heart failure: Secondary | ICD-10-CM | POA: Diagnosis not present

## 2021-09-04 DIAGNOSIS — I10 Essential (primary) hypertension: Secondary | ICD-10-CM | POA: Diagnosis not present

## 2021-09-05 DIAGNOSIS — I10 Essential (primary) hypertension: Secondary | ICD-10-CM | POA: Diagnosis not present

## 2021-09-06 DIAGNOSIS — J9622 Acute and chronic respiratory failure with hypercapnia: Secondary | ICD-10-CM | POA: Diagnosis not present

## 2021-09-06 DIAGNOSIS — M109 Gout, unspecified: Secondary | ICD-10-CM | POA: Diagnosis not present

## 2021-09-06 DIAGNOSIS — D649 Anemia, unspecified: Secondary | ICD-10-CM | POA: Diagnosis not present

## 2021-09-06 DIAGNOSIS — I5031 Acute diastolic (congestive) heart failure: Secondary | ICD-10-CM | POA: Diagnosis not present

## 2021-09-06 DIAGNOSIS — J9621 Acute and chronic respiratory failure with hypoxia: Secondary | ICD-10-CM | POA: Diagnosis not present

## 2021-09-06 DIAGNOSIS — K219 Gastro-esophageal reflux disease without esophagitis: Secondary | ICD-10-CM | POA: Diagnosis not present

## 2021-09-06 DIAGNOSIS — I110 Hypertensive heart disease with heart failure: Secondary | ICD-10-CM | POA: Diagnosis not present

## 2021-09-06 DIAGNOSIS — M1611 Unilateral primary osteoarthritis, right hip: Secondary | ICD-10-CM | POA: Diagnosis not present

## 2021-09-06 DIAGNOSIS — I10 Essential (primary) hypertension: Secondary | ICD-10-CM | POA: Diagnosis not present

## 2021-09-06 DIAGNOSIS — E119 Type 2 diabetes mellitus without complications: Secondary | ICD-10-CM | POA: Diagnosis not present

## 2021-09-07 DIAGNOSIS — I10 Essential (primary) hypertension: Secondary | ICD-10-CM | POA: Diagnosis not present

## 2021-09-08 DIAGNOSIS — M1611 Unilateral primary osteoarthritis, right hip: Secondary | ICD-10-CM | POA: Diagnosis not present

## 2021-09-08 DIAGNOSIS — I10 Essential (primary) hypertension: Secondary | ICD-10-CM | POA: Diagnosis not present

## 2021-09-08 DIAGNOSIS — K219 Gastro-esophageal reflux disease without esophagitis: Secondary | ICD-10-CM | POA: Diagnosis not present

## 2021-09-08 DIAGNOSIS — I110 Hypertensive heart disease with heart failure: Secondary | ICD-10-CM | POA: Diagnosis not present

## 2021-09-08 DIAGNOSIS — J9622 Acute and chronic respiratory failure with hypercapnia: Secondary | ICD-10-CM | POA: Diagnosis not present

## 2021-09-08 DIAGNOSIS — M109 Gout, unspecified: Secondary | ICD-10-CM | POA: Diagnosis not present

## 2021-09-08 DIAGNOSIS — D649 Anemia, unspecified: Secondary | ICD-10-CM | POA: Diagnosis not present

## 2021-09-08 DIAGNOSIS — I5031 Acute diastolic (congestive) heart failure: Secondary | ICD-10-CM | POA: Diagnosis not present

## 2021-09-08 DIAGNOSIS — J9621 Acute and chronic respiratory failure with hypoxia: Secondary | ICD-10-CM | POA: Diagnosis not present

## 2021-09-08 DIAGNOSIS — E119 Type 2 diabetes mellitus without complications: Secondary | ICD-10-CM | POA: Diagnosis not present

## 2021-09-09 DIAGNOSIS — I10 Essential (primary) hypertension: Secondary | ICD-10-CM | POA: Diagnosis not present

## 2021-09-10 DIAGNOSIS — I10 Essential (primary) hypertension: Secondary | ICD-10-CM | POA: Diagnosis not present

## 2021-09-10 DIAGNOSIS — D649 Anemia, unspecified: Secondary | ICD-10-CM | POA: Diagnosis not present

## 2021-09-10 DIAGNOSIS — E119 Type 2 diabetes mellitus without complications: Secondary | ICD-10-CM | POA: Diagnosis not present

## 2021-09-10 DIAGNOSIS — M1611 Unilateral primary osteoarthritis, right hip: Secondary | ICD-10-CM | POA: Diagnosis not present

## 2021-09-10 DIAGNOSIS — J9621 Acute and chronic respiratory failure with hypoxia: Secondary | ICD-10-CM | POA: Diagnosis not present

## 2021-09-10 DIAGNOSIS — J9622 Acute and chronic respiratory failure with hypercapnia: Secondary | ICD-10-CM | POA: Diagnosis not present

## 2021-09-10 DIAGNOSIS — M109 Gout, unspecified: Secondary | ICD-10-CM | POA: Diagnosis not present

## 2021-09-10 DIAGNOSIS — K219 Gastro-esophageal reflux disease without esophagitis: Secondary | ICD-10-CM | POA: Diagnosis not present

## 2021-09-10 DIAGNOSIS — I5031 Acute diastolic (congestive) heart failure: Secondary | ICD-10-CM | POA: Diagnosis not present

## 2021-09-10 DIAGNOSIS — I110 Hypertensive heart disease with heart failure: Secondary | ICD-10-CM | POA: Diagnosis not present

## 2021-09-11 DIAGNOSIS — I10 Essential (primary) hypertension: Secondary | ICD-10-CM | POA: Diagnosis not present

## 2021-09-13 DIAGNOSIS — I10 Essential (primary) hypertension: Secondary | ICD-10-CM | POA: Diagnosis not present

## 2021-09-14 ENCOUNTER — Telehealth: Payer: Self-pay

## 2021-09-14 DIAGNOSIS — K219 Gastro-esophageal reflux disease without esophagitis: Secondary | ICD-10-CM | POA: Diagnosis not present

## 2021-09-14 DIAGNOSIS — J9622 Acute and chronic respiratory failure with hypercapnia: Secondary | ICD-10-CM | POA: Diagnosis not present

## 2021-09-14 DIAGNOSIS — D649 Anemia, unspecified: Secondary | ICD-10-CM | POA: Diagnosis not present

## 2021-09-14 DIAGNOSIS — M109 Gout, unspecified: Secondary | ICD-10-CM | POA: Diagnosis not present

## 2021-09-14 DIAGNOSIS — I110 Hypertensive heart disease with heart failure: Secondary | ICD-10-CM | POA: Diagnosis not present

## 2021-09-14 DIAGNOSIS — J9621 Acute and chronic respiratory failure with hypoxia: Secondary | ICD-10-CM | POA: Diagnosis not present

## 2021-09-14 DIAGNOSIS — E119 Type 2 diabetes mellitus without complications: Secondary | ICD-10-CM | POA: Diagnosis not present

## 2021-09-14 DIAGNOSIS — I5031 Acute diastolic (congestive) heart failure: Secondary | ICD-10-CM | POA: Diagnosis not present

## 2021-09-14 DIAGNOSIS — I10 Essential (primary) hypertension: Secondary | ICD-10-CM | POA: Diagnosis not present

## 2021-09-14 DIAGNOSIS — M1611 Unilateral primary osteoarthritis, right hip: Secondary | ICD-10-CM | POA: Diagnosis not present

## 2021-09-14 NOTE — Telephone Encounter (Signed)
° °  Telephone encounter was:  Unsuccessful.  09/14/2021 Name: Michael Payne MRN: [id number] DOB: 10/08/1970  Unsuccessful outbound call made today to assist with:  Transportation Needs   Outreach Attempt:  1st Attempt  1st attempt Phone rings then call is dropped. This has happened three times.  [facility] Guide, Embedded Care Coordination [facility]  [place], [place] [address]  Main Phone: [phone]   E-mail: [email or web address]  Website: [email or web address]

## 2021-09-15 ENCOUNTER — Telehealth: Payer: Self-pay

## 2021-09-15 DIAGNOSIS — M1611 Unilateral primary osteoarthritis, right hip: Secondary | ICD-10-CM | POA: Diagnosis not present

## 2021-09-15 DIAGNOSIS — D649 Anemia, unspecified: Secondary | ICD-10-CM | POA: Diagnosis not present

## 2021-09-15 DIAGNOSIS — J9621 Acute and chronic respiratory failure with hypoxia: Secondary | ICD-10-CM | POA: Diagnosis not present

## 2021-09-15 DIAGNOSIS — I10 Essential (primary) hypertension: Secondary | ICD-10-CM | POA: Diagnosis not present

## 2021-09-15 DIAGNOSIS — I110 Hypertensive heart disease with heart failure: Secondary | ICD-10-CM | POA: Diagnosis not present

## 2021-09-15 DIAGNOSIS — E119 Type 2 diabetes mellitus without complications: Secondary | ICD-10-CM | POA: Diagnosis not present

## 2021-09-15 DIAGNOSIS — I5031 Acute diastolic (congestive) heart failure: Secondary | ICD-10-CM | POA: Diagnosis not present

## 2021-09-15 DIAGNOSIS — K219 Gastro-esophageal reflux disease without esophagitis: Secondary | ICD-10-CM | POA: Diagnosis not present

## 2021-09-15 DIAGNOSIS — J9622 Acute and chronic respiratory failure with hypercapnia: Secondary | ICD-10-CM | POA: Diagnosis not present

## 2021-09-15 DIAGNOSIS — M109 Gout, unspecified: Secondary | ICD-10-CM | POA: Diagnosis not present

## 2021-09-15 NOTE — Telephone Encounter (Signed)
° °  Telephone encounter was:  Successful.  09/15/2021 Name: Michael Payne MRN: [id number] DOB: [birth year]  Michael Payne is a 50 y.o. year old male who is a primary care patient of Joaquim Nam, MD . The community resource team was consulted for assistance with Transportation Needs   Care guide performed the following interventions:  Patient has been advised that [facility] will be picking him up on 12/29 at 9am.  Follow Up Plan:  No further follow up planned at this time. The patient has been provided with needed resources.  [facility] Guide, Embedded Care Coordination [facility]  [place], [place] [address]  Main Phone: [phone]   E-mail: [email or web address]  Website: [email or web address]

## 2021-09-16 ENCOUNTER — Other Ambulatory Visit: Payer: Self-pay

## 2021-09-16 ENCOUNTER — Encounter: Payer: Medicare HMO | Attending: Surgery | Admitting: Skilled Nursing Facility1

## 2021-09-16 DIAGNOSIS — I10 Essential (primary) hypertension: Secondary | ICD-10-CM | POA: Diagnosis not present

## 2021-09-16 DIAGNOSIS — E119 Type 2 diabetes mellitus without complications: Secondary | ICD-10-CM | POA: Insufficient documentation

## 2021-09-16 DIAGNOSIS — R0902 Hypoxemia: Secondary | ICD-10-CM | POA: Diagnosis not present

## 2021-09-16 DIAGNOSIS — Z7401 Bed confinement status: Secondary | ICD-10-CM | POA: Diagnosis not present

## 2021-09-16 NOTE — Progress Notes (Signed)
Nutrition Assessment for Bariatric Surgery Medical Nutrition Therapy Appt Start Time: 10:45    End Time: 11:45  Patient was seen on 09/16/2021 for Pre-Operative Nutrition Assessment. Letter of approval faxed to [facility] bariatric surgery program coordinator on 09/16/2021.   Referral stated Supervised Weight Loss (SWL) visits needed: 6  Planned surgery: sleeve gastrectomy  Pt expectation of surgery: weight loss  Pt expectation of dietitian: none stated    NUTRITION ASSESSMENT   Anthropometrics  Start weight at [facility]: pt declined due to coming in in a stretcher  Height:  in BMI:  kg/m2     Clinical  Medical hx: Chf, Respiratory Failure, Diabetes, osteoarthritis, OSA Medications: allopurinol, Trulicity, furosemide, oxycodone, amlodpine, potassium-chloride, tylenol, vitamin c, multi  Labs: A1C 6.5 Notable signs/symptoms:  Any previous deficiencies? No  Micronutrient Nutrition Focused Physical Exam: Hair: No issues observed Eyes: No issues observed Mouth: No issues observed Neck: No issues observed Nails: No issues observed Skin: No issues observed  Lifestyle & Dietary Hx  Pt was brought into the office in a stretcher and the appointment was held in the waiting room due to being bed ridden for 2 years.   Pt reports his daughter moved in with him to help with food but this is only temporary (one year).   Pt has started physical therapy and occupational therapy. Monday, Wednesday and Friday he works on mid section and legs and Tuesday and  Thursday he works on arms with his physical therapist but he did start working on his own physical activity on his own in October Pt states he is very happy that he can sit up on the side of the bed now, he workouts about 60 mins a day and is feeling a lot stronger.  Pt states that he uses his Bipap every night ever since his episode of CHF in the hospital.  Pt states he is doing talk therapy weekly.  Pt states he is looking  forward to a color party this weekend.   Pt states he is realizing from watching a video on tiktok that he is an emotional eater but currently this has not been an issue due to being bed bound. Pt states that his daughter does a good job of taking care of him now in not letting home eat high fat and sugar foods and she is currently following the keto diet.  Pt sates he currently limiting his sodium intake to 2000 mg.  Pt states he has been struggling with constipation lately, pt believes it was from eating cheese over the holidays.  Majority of care conducted by CNA. CNA cooks his mom's meals lunch. Daughter does breakfast and dinner.    24-Hr Dietary Recall First Meal: oatmeal + fruit Snack: fruit Second Meal: mom's meals low sodium Snack: sun chips Third Meal: [place] burger + veggies Snack: guacamole  Beverages: hot tea + honey, sprite zero, water    Estimated Energy Needs Calories: 2200   NUTRITION DIAGNOSIS  Overweight/obesity ([place]-3.3) related to past poor dietary habits and physical inactivity as evidenced by patient w/ planned Sleeve Gastrectomy surgery following dietary guidelines for continued weight loss.    NUTRITION INTERVENTION  Nutrition counseling (C-1) and education (E-2) to facilitate bariatric surgery goals.  Educated pt on micronutrient deficiencies post surgery and strategies to mitigate that risk   Pre-Op Goals Reviewed with the Patient Track food and beverage intake (pen and paper, MyFitness Pal, Baritastic app, etc.) Make healthy food choices while monitoring portion sizes Consume 3 meals per  day or try to eat every 3-5 hours Avoid concentrated sugars and fried foods Keep sugar & fat in the single digits per serving on food labels Practice CHEWING your food (aim for applesauce consistency) Practice not drinking 15 minutes before, during, and 30 minutes after each meal and snack Avoid all carbonated beverages (ex: soda, sparkling beverages)  Limit  caffeinated beverages (ex: coffee, tea, energy drinks) Avoid all sugar-sweetened beverages (ex: regular soda, sports drinks)  Avoid alcohol  Aim for 64-100 ounces of FLUID daily (with at least half of fluid intake being plain water)  Aim for at least 60-80 grams of PROTEIN daily Look for a liquid protein source that contains ?15 g protein and ?5 g carbohydrate (ex: shakes, drinks, shots) Make a list of non-food related activities Physical activity is an important part of a healthy lifestyle so keep it moving! The goal is to reach 150 minutes of exercise per week, including cardiovascular and weight baring activity.  *Goals that are bolded indicate the pt would like to start working towards these  Handouts Provided Include  Bariatric Surgery handouts (Nutrition Visits, Pre-Op Goals, Protein Shakes, Vitamins & Minerals)  Learning Style & Readiness for Change Teaching method utilized: Visual & Auditory  Demonstrated degree of understanding via: Teach Back  Readiness Level: action Barriers to learning/adherence to lifestyle change: Pt is bed bound  RD's Notes for Next Visit Assess pt's adherence to chosen goals      MONITORING & EVALUATION Dietary intake, weekly physical activity, body weight, and pre-op goals reached at next nutrition visit.    Next Steps  Patient is to follow up for next SWL

## 2021-09-17 DIAGNOSIS — J9621 Acute and chronic respiratory failure with hypoxia: Secondary | ICD-10-CM | POA: Diagnosis not present

## 2021-09-17 DIAGNOSIS — E119 Type 2 diabetes mellitus without complications: Secondary | ICD-10-CM | POA: Diagnosis not present

## 2021-09-17 DIAGNOSIS — K219 Gastro-esophageal reflux disease without esophagitis: Secondary | ICD-10-CM | POA: Diagnosis not present

## 2021-09-17 DIAGNOSIS — J9622 Acute and chronic respiratory failure with hypercapnia: Secondary | ICD-10-CM | POA: Diagnosis not present

## 2021-09-17 DIAGNOSIS — D649 Anemia, unspecified: Secondary | ICD-10-CM | POA: Diagnosis not present

## 2021-09-17 DIAGNOSIS — M1611 Unilateral primary osteoarthritis, right hip: Secondary | ICD-10-CM | POA: Diagnosis not present

## 2021-09-17 DIAGNOSIS — M109 Gout, unspecified: Secondary | ICD-10-CM | POA: Diagnosis not present

## 2021-09-17 DIAGNOSIS — I110 Hypertensive heart disease with heart failure: Secondary | ICD-10-CM | POA: Diagnosis not present

## 2021-09-17 DIAGNOSIS — I5031 Acute diastolic (congestive) heart failure: Secondary | ICD-10-CM | POA: Diagnosis not present

## 2021-09-17 DIAGNOSIS — I10 Essential (primary) hypertension: Secondary | ICD-10-CM | POA: Diagnosis not present

## 2021-09-18 DIAGNOSIS — I5032 Chronic diastolic (congestive) heart failure: Secondary | ICD-10-CM

## 2021-09-18 DIAGNOSIS — E119 Type 2 diabetes mellitus without complications: Secondary | ICD-10-CM

## 2021-09-18 DIAGNOSIS — F339 Major depressive disorder, recurrent, unspecified: Secondary | ICD-10-CM | POA: Diagnosis not present

## 2021-09-18 DIAGNOSIS — I10 Essential (primary) hypertension: Secondary | ICD-10-CM | POA: Diagnosis not present

## 2021-09-20 DIAGNOSIS — I10 Essential (primary) hypertension: Secondary | ICD-10-CM | POA: Diagnosis not present

## 2021-09-21 DIAGNOSIS — E119 Type 2 diabetes mellitus without complications: Secondary | ICD-10-CM | POA: Diagnosis not present

## 2021-09-21 DIAGNOSIS — J9622 Acute and chronic respiratory failure with hypercapnia: Secondary | ICD-10-CM | POA: Diagnosis not present

## 2021-09-21 DIAGNOSIS — K219 Gastro-esophageal reflux disease without esophagitis: Secondary | ICD-10-CM | POA: Diagnosis not present

## 2021-09-21 DIAGNOSIS — J9621 Acute and chronic respiratory failure with hypoxia: Secondary | ICD-10-CM | POA: Diagnosis not present

## 2021-09-21 DIAGNOSIS — I5031 Acute diastolic (congestive) heart failure: Secondary | ICD-10-CM | POA: Diagnosis not present

## 2021-09-21 DIAGNOSIS — I10 Essential (primary) hypertension: Secondary | ICD-10-CM | POA: Diagnosis not present

## 2021-09-21 DIAGNOSIS — D649 Anemia, unspecified: Secondary | ICD-10-CM | POA: Diagnosis not present

## 2021-09-21 DIAGNOSIS — I110 Hypertensive heart disease with heart failure: Secondary | ICD-10-CM | POA: Diagnosis not present

## 2021-09-21 DIAGNOSIS — M109 Gout, unspecified: Secondary | ICD-10-CM | POA: Diagnosis not present

## 2021-09-21 DIAGNOSIS — M1611 Unilateral primary osteoarthritis, right hip: Secondary | ICD-10-CM | POA: Diagnosis not present

## 2021-09-22 ENCOUNTER — Encounter: Payer: Medicare HMO | Attending: Surgery | Admitting: Skilled Nursing Facility1

## 2021-09-22 ENCOUNTER — Telehealth: Payer: Self-pay

## 2021-09-22 DIAGNOSIS — E119 Type 2 diabetes mellitus without complications: Secondary | ICD-10-CM | POA: Diagnosis not present

## 2021-09-22 DIAGNOSIS — F330 Major depressive disorder, recurrent, mild: Secondary | ICD-10-CM | POA: Diagnosis not present

## 2021-09-22 DIAGNOSIS — I10 Essential (primary) hypertension: Secondary | ICD-10-CM | POA: Diagnosis not present

## 2021-09-22 DIAGNOSIS — F419 Anxiety disorder, unspecified: Secondary | ICD-10-CM | POA: Diagnosis not present

## 2021-09-22 NOTE — Progress Notes (Signed)
Supervised Weight Loss Visit Bariatric Nutrition Education  Sleeve gastrectomy   1 out of 6 SWL Appointments    NUTRITION ASSESSMENT  Anthropometrics  Start weight at [facility]: Pt is bedbound Weight: pt is bedbound  Clinical  Medical hx: Chf, Respiratory Failure, Diabetes, osteoarthritis, OSA Medications: allopurinol, Trulicity, furosemide, oxycodone, amlodpine, potassium-chloride, tylenol, vitamin c, multi  Labs: A1C 6.5 Notable signs/symptoms:  Any previous deficiencies? No  Lifestyle & Dietary Hx  Pts daughter makes his breakfast and dinner and is very supportive: angelina  Pt states he has been doing really well with PT. Pt states he is starting a 30 day [employer] fast which is vegetarian.    Estimated daily fluid intake:  oz Supplements:  Current average weekly physical activity: bedbound  24-Hr Dietary Recall: 3 times a day fruit First Meal: [place] sausage cressoint with cheese + grapes Snack:  Second Meal: grilled fish and salad: leafy veg, onion, pepper, ginger, garlic, olive oil, tiumeric Snack: fruit Third Meal: hot wings, black eyed peas and greens Snack:  Beverages: water  Estimated Energy Needs Calories: 2200   NUTRITION DIAGNOSIS  Overweight/obesity ([place]-3.3) related to past poor dietary habits and physical inactivity as evidenced by patient w/ planned sleeve gastrectomy surgery following dietary guidelines for continued weight loss.   NUTRITION INTERVENTION  Nutrition counseling (C-1) and education (E-2) to facilitate bariatric surgery goals.  Pre-Op Goals Progress & New Goals Great up the great work! Be sitting up for our next appt  Handouts Provided Include    Learning Style & Readiness for Change Teaching method utilized: Visual & Auditory  Demonstrated degree of understanding via: Teach Back  Readiness Level: action Barriers to learning/adherence to lifestyle change: none; his daughter is providing his meals which is following a healthy  diet  RD's Notes for next Visit  Assess pts adherence to chosen goals   MONITORING & EVALUATION Dietary intake, weekly physical activity, body weight, and pre-op goals in 1 month.   Next Steps  Patient is to return to [facility] in 1 month

## 2021-09-22 NOTE — Chronic Care Management (AMB) (Signed)
° ° °  Chronic Care Management Pharmacy Assistant   Name: Michael Payne  MRN: [id number] DOB: 1970/11/13   Reason for Encounter: Reminder Call    Conditions to be addressed/monitored: HTN and DMII   Outpatient Encounter Medications as of 09/22/2021  Medication Sig   acetaminophen (TYLENOL) 325 MG tablet Take 2 tablets (650 mg total) by mouth every 6 (six) hours as needed for headache or mild pain.   allopurinol (ZYLOPRIM) 100 MG tablet Take 1 tablet (100 mg total) by mouth daily.   amLODipine (NORVASC) 10 MG tablet Take 1 tablet (10 mg total) by mouth daily.   carvedilol (COREG) 3.125 MG tablet Take 1 tablet (3.125 mg total) by mouth 2 (two) times daily with a meal.   Colchicine (MITIGARE) 0.6 MG CAPS Take 0.5 tablets by mouth daily as needed (for gout.  okay to fill with mitigare).   diclofenac Sodium (VOLTAREN) 1 % GEL Apply 4 g topically 4 (four) times daily.   Dulaglutide (TRULICITY) 1.5 MG/0.5ML SOPN Inject 1.5 mg into the skin once a week.   furosemide (LASIX) 40 MG tablet Take 1 tablet (40 mg total) by mouth daily.   Multiple Vitamins-Minerals (MULTIVITAMIN WITH MINERALS) tablet Take 1 tablet by mouth daily.   naloxone (NARCAN) nasal spray 4 mg/0.1 mL Spray nostril if needed for opioid overuse- decreased consciousness, decreased respirations   oxyCODONE-acetaminophen (PERCOCET) 10-325 MG tablet Take 1 tablet by mouth every 6 (six) hours as needed for pain.   potassium chloride (KLOR-CON) 10 MEQ tablet Take 1 tablet (10 mEq total) by mouth daily.   vitamin C (ASCORBIC ACID) 500 MG tablet Take 500 mg by mouth daily.   No facility-administered encounter medications on file as of 09/22/2021.   Michael Payne  did not answer the telephone  to remind him of his upcoming telephone visit with [name] on 09/27/21 at 1:00pm. Patient was reminded to have all medications, supplements and any blood glucose and blood pressure readings available for review at appointment. Detailed message left  on machine     Star Rating Drugs: Medication:   Last Fill: Day Supply Trulicity 1.5mg /0.43ml  09/18/21 28    Mardella Layman Foltanski,RPH CPP notified  Burt Knack, [facility] Clincal Pharmacy Assistant [phone]  Total time spent for month CPA: 10 min

## 2021-09-23 ENCOUNTER — Telehealth: Payer: Medicare HMO | Admitting: Skilled Nursing Facility1

## 2021-09-23 DIAGNOSIS — K219 Gastro-esophageal reflux disease without esophagitis: Secondary | ICD-10-CM | POA: Diagnosis not present

## 2021-09-23 DIAGNOSIS — E119 Type 2 diabetes mellitus without complications: Secondary | ICD-10-CM | POA: Diagnosis not present

## 2021-09-23 DIAGNOSIS — I10 Essential (primary) hypertension: Secondary | ICD-10-CM | POA: Diagnosis not present

## 2021-09-23 DIAGNOSIS — I110 Hypertensive heart disease with heart failure: Secondary | ICD-10-CM | POA: Diagnosis not present

## 2021-09-23 DIAGNOSIS — J9622 Acute and chronic respiratory failure with hypercapnia: Secondary | ICD-10-CM | POA: Diagnosis not present

## 2021-09-23 DIAGNOSIS — D649 Anemia, unspecified: Secondary | ICD-10-CM | POA: Diagnosis not present

## 2021-09-23 DIAGNOSIS — I5031 Acute diastolic (congestive) heart failure: Secondary | ICD-10-CM | POA: Diagnosis not present

## 2021-09-23 DIAGNOSIS — J9621 Acute and chronic respiratory failure with hypoxia: Secondary | ICD-10-CM | POA: Diagnosis not present

## 2021-09-23 DIAGNOSIS — M109 Gout, unspecified: Secondary | ICD-10-CM | POA: Diagnosis not present

## 2021-09-23 DIAGNOSIS — M1611 Unilateral primary osteoarthritis, right hip: Secondary | ICD-10-CM | POA: Diagnosis not present

## 2021-09-24 DIAGNOSIS — I5031 Acute diastolic (congestive) heart failure: Secondary | ICD-10-CM | POA: Diagnosis not present

## 2021-09-24 DIAGNOSIS — J9621 Acute and chronic respiratory failure with hypoxia: Secondary | ICD-10-CM | POA: Diagnosis not present

## 2021-09-24 DIAGNOSIS — I10 Essential (primary) hypertension: Secondary | ICD-10-CM | POA: Diagnosis not present

## 2021-09-24 DIAGNOSIS — M1611 Unilateral primary osteoarthritis, right hip: Secondary | ICD-10-CM | POA: Diagnosis not present

## 2021-09-24 DIAGNOSIS — J9622 Acute and chronic respiratory failure with hypercapnia: Secondary | ICD-10-CM | POA: Diagnosis not present

## 2021-09-24 DIAGNOSIS — D649 Anemia, unspecified: Secondary | ICD-10-CM | POA: Diagnosis not present

## 2021-09-24 DIAGNOSIS — I110 Hypertensive heart disease with heart failure: Secondary | ICD-10-CM | POA: Diagnosis not present

## 2021-09-24 DIAGNOSIS — K219 Gastro-esophageal reflux disease without esophagitis: Secondary | ICD-10-CM | POA: Diagnosis not present

## 2021-09-24 DIAGNOSIS — E119 Type 2 diabetes mellitus without complications: Secondary | ICD-10-CM | POA: Diagnosis not present

## 2021-09-24 DIAGNOSIS — M109 Gout, unspecified: Secondary | ICD-10-CM | POA: Diagnosis not present

## 2021-09-25 DIAGNOSIS — I10 Essential (primary) hypertension: Secondary | ICD-10-CM | POA: Diagnosis not present

## 2021-09-26 DIAGNOSIS — I10 Essential (primary) hypertension: Secondary | ICD-10-CM | POA: Diagnosis not present

## 2021-09-27 ENCOUNTER — Telehealth: Payer: Self-pay | Admitting: Family Medicine

## 2021-09-27 ENCOUNTER — Telehealth: Payer: Self-pay | Admitting: Pharmacist

## 2021-09-27 ENCOUNTER — Other Ambulatory Visit: Payer: Self-pay | Admitting: Family Medicine

## 2021-09-27 ENCOUNTER — Other Ambulatory Visit: Payer: Medicare HMO | Admitting: Family Medicine

## 2021-09-27 ENCOUNTER — Other Ambulatory Visit: Payer: Self-pay

## 2021-09-27 ENCOUNTER — Ambulatory Visit (INDEPENDENT_AMBULATORY_CARE_PROVIDER_SITE_OTHER): Payer: Medicare HMO | Admitting: Pharmacist

## 2021-09-27 DIAGNOSIS — I5032 Chronic diastolic (congestive) heart failure: Secondary | ICD-10-CM

## 2021-09-27 DIAGNOSIS — E119 Type 2 diabetes mellitus without complications: Secondary | ICD-10-CM

## 2021-09-27 DIAGNOSIS — I10 Essential (primary) hypertension: Secondary | ICD-10-CM

## 2021-09-27 DIAGNOSIS — M1611 Unilateral primary osteoarthritis, right hip: Secondary | ICD-10-CM

## 2021-09-27 MED ORDER — OXYCODONE-ACETAMINOPHEN 10-325 MG PO TABS: 1.0000 | ORAL_TABLET | Freq: Four times a day (QID) | ORAL | 0 refills | Status: DC | PRN

## 2021-09-27 MED ORDER — TRULICITY 3 MG/0.5ML ~~LOC~~ SOAJ: 3.0000 mg | SUBCUTANEOUS | 2 refills | Status: DC

## 2021-09-27 NOTE — Telephone Encounter (Signed)
Patient reports he is tolerating Trulicity 1.5 mg weekly (has taken 4 doses); he reports decrease in appetite but not as much as he had hoped. He denies s/sx of hypoglycemia. He is undergoing PT/OT and seeing nutrition for pre-approval for bariatric surgery; he would like to further increase Trulicity for help with weight loss. He is due for refill but hasn't picked up his refill yet, awaiting decision on new dose.  Recommend to increase Trulicity to 3 mg weekly - consulting with PCP. [facility].   Lab Results  Component Value Date/Time   HGBA1C 5.9 (H) 06/01/2021 12:00 PM   HGBA1C 6.5 03/09/2021 12:32 PM   BMI Readings from Last 3 Encounters:  07/26/21 72.46 kg/m  07/05/21 83.02 kg/m  06/18/21 83.02 kg/m

## 2021-09-27 NOTE — Addendum Note (Signed)
Addended by: Joaquim Nam on: 09/27/2021 04:39 PM   Modules accepted: Orders

## 2021-09-27 NOTE — Telephone Encounter (Signed)
Patient is due oxycodone (Last filled 08/27/21  #120, 30 day-supply).  As for f/u labs in the spring - I'm not sure if home health is an option to draw labs? Pt used transportation services/stretcher to bring him to nutrition appt recently, we could use the same for lab appt/follow up. Routing to LCSW for assistance.

## 2021-09-27 NOTE — Telephone Encounter (Signed)
°  Encourage patient to contact the pharmacy for refills or they can request refills through [facility]  LAST APPOINTMENT DATE:  Please schedule appointment if longer than 1 year  NEXT APPOINTMENT DATE:  MEDICATION: oxyCODONE-acetaminophen (PERCOCET) 10-325 MG tablet  Is the patient out of medication? Yes- took last one this morning  [facility] [address]  Let patient know to contact pharmacy at the end of the day to make sure medication is ready.  Please notify patient to allow 48-72 hours to process  CLINICAL FILLS OUT ALL BELOW:   LAST REFILL:  QTY:  REFILL DATE:    OTHER COMMENTS:    Okay for refill?  Please advise

## 2021-09-27 NOTE — Telephone Encounter (Signed)
LOV - 07/05/21 NOV - Not scheduled RF- 08/27/21 #120/0

## 2021-09-27 NOTE — Telephone Encounter (Signed)
Sent. Thanks.   

## 2021-09-27 NOTE — Progress Notes (Signed)
Chronic Care Management Pharmacy Note  09/28/2021 Name:  Michael Payne MRN:  [id number] DOB:  1971-06-01  Summary: -Pt is tolerating Trulicity 1.5 mg weekly (has taken 4 doses); he reports decrease in appetite but not as much as he had hoped; he is undergoing PT/OT and seeing nutrition for pre-approval for bariatric surgery; he would like to further increase Trulicity if able  Recommendations/Changes made from today's visit: -Increase Trulicity to 3 mg weekly for further weight loss (see phone note)  Plan: -[facility] will call patient in 1 month for Trulicity tolerability -Pharmacist follow up televisit scheduled for 3 months  Subjective: Michael Payne is an 51 y.o. year old male who is a primary patient of Damita Dunnings, Elveria Rising, MD.  The CCM team was consulted for assistance with disease management and care coordination needs.    Engaged with patient by telephone for follow up visit in response to provider referral for pharmacy case management and/or care coordination services.   Consent to Services:  The patient was given information about Chronic Care Management services, agreed to services, and gave verbal consent prior to initiation of services.  Please see initial visit note for detailed documentation.   Patient Care Team: [name], MD as PCP - General (Family Medicine) Deirdre Peer, LCSW as Social Worker (Licensed Clinical Social Worker) Nyoka Cowden, Delice Lesch, RN as Case Manager Chesley Mires, MD as Consulting Physician (Pulmonary Disease) [name], [name], MD as Consulting Physician (Surgery)  Recent office visits: 07/05/21 - PCP - Hospital follow up, video visit. Patient was admitted with acute respiratory failure requiring intubation and ICU management. Started BiPAP. CHF, diuresis required. Continue carvedilol, lasix, amlodipine. Will need to follow up with bariatric. 03/09/21 - PCP - Patient presented to PCP for follow up visit. PCP will look into getting  a lift and extra help at home, pulmonology referral and weight loss medication.   Recent consult visits: 08/04/21 - [facility] follow up. Continue Trilogy ventilator. Follow up 6 months. 07/26/21 - Cardiology - Pre-op for bariatric surgery. Continue current medications.   Hospital visits: 05/31/21 - 06/28/21 - Hospital visit - Acute diastolic CHF, respiratory failure. Start furosemide 40 mg daily, potassium chloride 10 mEq daily, and carvedilol 3.125 mg BID.   Objective:  Lab Results  Component Value Date   CREATININE 0.63 (L) 07/26/2021   BUN 12 07/26/2021   GFR 109.18 03/09/2021   GFRNONAA >60 06/23/2021   GFRAA >60 01/03/2020   NA 140 07/26/2021   K 4.3 07/26/2021   CALCIUM 9.2 07/26/2021   CO2 24 07/26/2021   GLUCOSE 101 (H) 07/26/2021   Lab Results  Component Value Date/Time   HGBA1C 5.9 (H) 06/01/2021 12:00 PM   HGBA1C 6.5 03/09/2021 12:32 PM   GFR 109.18 03/09/2021 12:32 PM   GFR 116.29 12/18/2019 05:12 PM    Lab Results  Component Value Date   CHOL 165 03/09/2021   HDL 38.50 (L) 03/09/2021   LDLCALC 101 (H) 03/09/2021   LDLDIRECT 76.0 10/29/2018   TRIG 129.0 03/09/2021   CHOLHDL 4 03/09/2021    Hepatic Function Latest Ref Rng & Units 06/23/2021 06/07/2021 06/01/2021  Total Protein 6.5 - 8.1 g/dL 6.9 7.7 6.7  Albumin 3.5 - 5.0 g/dL 3.2(L) 3.1(L) 3.2(L)  AST 15 - 41 U/L 86(H) 43(H) 28  ALT 0 - 44 U/L 165(H) 53(H) 40  Alk Phosphatase 38 - 126 U/L 44 43 53  Total Bilirubin 0.3 - 1.2 mg/dL 0.5 0.7 1.2  Lab Results  Component Value Date/Time   TSH 4.33 03/09/2021 12:32 PM   TSH 2.63 12/18/2019 05:12 PM    CBC Latest Ref Rng & Units 07/26/2021 06/23/2021 06/14/2021  WBC 3.4 - 10.8 x10E3/uL 7.7 6.3 6.3  Hemoglobin 13.0 - 17.7 g/dL 13.4 12.5(L) 12.1(L)  Hematocrit 37.5 - 51.0 % 42.4 42.8 41.5  Platelets 150 - 450 x10E3/uL 261 234 260    Lab Results  Component Value Date/Time   VD25OH 10.08 (L) 12/18/2019 05:12 PM    Clinical ASCVD: No  The  10-year ASCVD risk score (Arnett DK, et al., 2019) is: 18%   Values used to calculate the score:     Age: 51 years     Sex: Male     Is Non-Hispanic African American: Yes     Diabetic: Yes     Tobacco smoker: No     Systolic Blood Pressure: 923 mmHg     Is BP treated: Yes     HDL Cholesterol: 38.5 mg/dL     Total Cholesterol: 165 mg/dL    Depression screen [facility] 2/9 05/15/2021 04/06/2021 02/11/2021  Decreased Interest 0 0 0  Down, Depressed, Hopeless 0 0 0  PHQ - 2 Score 0 0 0  Altered sleeping 0 0 -  Tired, decreased energy 0 0 -  Change in appetite 0 0 -  Feeling bad or failure about yourself  0 0 -  Trouble concentrating 0 0 -  Moving slowly or fidgety/restless 0 0 -  Suicidal thoughts 0 0 -  PHQ-9 Score 0 0 -  Difficult doing work/chores Not difficult at all Not difficult at all -    Social History   Tobacco Use  Smoking Status Former   Packs/day: 2.00   Years: 22.00   Pack years: 44.00   Types: Cigarettes   Quit date: 02/24/2011   Years since quitting: 10.6  Smokeless Tobacco Never   BP Readings from Last 3 Encounters:  08/04/21 135/78  07/26/21 (!) 148/95  06/28/21 (!) 145/80   Pulse Readings from Last 3 Encounters:  07/26/21 95  07/05/21 81  06/28/21 80   Wt Readings from Last 3 Encounters:  06/18/21 (!) 595 lb 3.9 oz (270 kg)  11/05/20 (!) 490 lb (222.3 kg)  09/02/20 (!) 490 lb (222.3 kg)   BMI Readings from Last 3 Encounters:  07/26/21 72.46 kg/m  07/05/21 83.02 kg/m  06/18/21 83.02 kg/m    Assessment/Interventions: Review of patient past medical history, allergies, medications, health status, including review of consultants reports, laboratory and other test data, was performed as part of comprehensive evaluation and provision of chronic care management services.   SDOH:  (Social Determinants of Health) assessments and interventions performed: Yes   SDOH Screenings   Alcohol Screen: Low Risk    Last Alcohol Screening Score (AUDIT): 0   Depression (PHQ2-9): Low Risk    PHQ-2 Score: 0  Financial Resource Strain: Low Risk    Difficulty of Paying Living Expenses: Not hard at all  Food Insecurity: No Food Insecurity   Worried About Charity fundraiser in the Last Year: Never true   Ran Out of Food in the Last Year: Never true  Housing: Low Risk    Last Housing Risk Score: 0  Physical Activity: Inactive   Days of Exercise per Week: 0 days   Minutes of Exercise per Session: 0 min  Social Connections: Unknown   Frequency of Communication with Friends and Family: Twice a week   Frequency of  Social Gatherings with Friends and Family: Once a week   Attends Religious Services: Never   Marine scientist or Organizations: No   Attends Music therapist: Never   Marital Status: Not on file  Stress: Stress Concern Present   Feeling of Stress : To some extent  Tobacco Use: Medium Risk   Smoking Tobacco Use: Former   Smokeless Tobacco Use: Never   Passive Exposure: Not on file  Transportation Needs: Unmet Transportation Needs   Lack of Transportation (Medical): Yes   Lack of Transportation (Non-Medical): Yes    CCM Care Plan  Allergies  Allergen Reactions   Aspirin Other (See Comments)    Upset stomach   Lactose Intolerance (Gi) Nausea And Vomiting   Lisinopril     Causes cough    Medications Reviewed Today     Reviewed by Charlton Haws, [facility] (Pharmacist) on 09/27/21 at 1325  Med List Status: <None>   Medication Order Taking? Sig Documenting Provider Last Dose Status Informant  acetaminophen (TYLENOL) 325 MG tablet [id number] Yes Take 2 tablets (650 mg total) by mouth every 6 (six) hours as needed for headache or mild pain. Samella Parr, NP Taking Active   allopurinol (ZYLOPRIM) 100 MG tablet [id number] Yes Take 1 tablet (100 mg total) by mouth daily. [name], MD Taking Active   amLODipine (NORVASC) 10 MG tablet [id number] Yes Take 1 tablet (10 mg total) by mouth daily. [name], MD Taking Active   carvedilol (COREG) 3.125 MG tablet [id number] Yes Take 1 tablet (3.125 mg total) by mouth 2 (two) times daily with a meal. [name], MD Taking Active   Colchicine (MITIGARE) 0.6 MG CAPS [id number] Yes Take 0.5 tablets by mouth daily as needed (for gout.  okay to fill with mitigare). [name], MD Taking Active   diclofenac Sodium (VOLTAREN) 1 % GEL [id number] Yes Apply 4 g topically 4 (four) times daily. Samella Parr, NP Taking Active   Dulaglutide (TRULICITY) 1.5 NT/6.1WE SOPN [id number] Yes Inject 1.5 mg into the skin once a week. [name], MD Taking Active   furosemide (LASIX) 40 MG tablet [id number] Yes Take 1 tablet (40 mg total) by mouth daily. [name], MD Taking Active   Multiple Vitamins-Minerals (MULTIVITAMIN WITH MINERALS) tablet [id number] Yes Take 1 tablet by mouth daily. [provider] Taking Active   naloxone [facility]) nasal spray 4 mg/0.1 mL [id number] Yes Spray nostril if needed for opioid overuse- decreased consciousness, decreased respirations Elby Beck, FNP Taking Active Self  oxyCODONE-acetaminophen (PERCOCET) 10-325 MG tablet [id number] Yes Take 1 tablet by mouth every 6 (six) hours as needed for pain. [name], MD Taking Active   potassium chloride (KLOR-CON) 10 MEQ tablet [id number] Yes Take 1 tablet (10 mEq total) by mouth daily. [name], MD Taking Active   vitamin C (ASCORBIC ACID) 500 MG tablet [id number] Yes Take 500 mg by mouth daily. [provider] Taking Active             Patient Active Problem List   Diagnosis Date Noted   Nonobstructive transaminitis 06/28/2021   Decubitus ulcer of posterior thigh, stage 2 ([place]) 06/28/2021   Acute kidney injury (resolved) 06/28/2021   Pre-diabetes 06/10/2021   Obesity, Class III, BMI 40-49.9 (morbid obesity) ([place]) 06/10/2021   Osteoarthritis 06/10/2021   Pressure injury of skin 06/09/2021   Respiratory failure ([place])  83/41/9622   Acute diastolic CHF (congestive heart failure) ([place]) 05/31/2021  CHF (congestive heart failure) ([place]) 05/31/2021   Hypoxia 05/31/2021   Gout, unspecified 03/10/2021   Edema 03/10/2021   Diabetes mellitus without complication ([place]) 29/93/7169   Muscle spasms of both lower extremities 07/29/2020   Bedbound 05/22/2020   Unilateral osteoarthritis of hip, right 12/27/2019   Severe obstructive sleep apnea 02/22/2018   Hypertriglyceridemia 08/24/2017   Low HDL (under 40) 67/89/3810   Metabolic syndrome 17/51/0258   Morbid obesity ([place]) 10/14/2013   HTN (hypertension) 10/14/2013    Immunization History  Administered Date(s) Administered   Tdap 02/03/2012    Conditions to be addressed/monitored:  Hypertension, Diabetes, and Heart Failure  Care Plan : [place]  Updates made by Charlton Haws, [place] since 09/28/2021 12:00 AM     Problem: Hypertension, Diabetes, and Heart Failure   Priority: High     Long-Range Goal: Disease Management   Start Date: 03/16/2021  Expected End Date: 09/28/2022  This Visit's Progress: On track  Recent Progress: On track  Priority: High  Note:   Current Barriers:  Unable to achieve control of obesity   Pharmacist Clinical Goal(s):  Patient will achieve improvement in obesity as evidenced by weight loss through collaboration with PharmD and provider.   Interventions: 1:1 collaboration with [name], MD regarding development and update of comprehensive plan of care as evidenced by provider attestation and co-signature Inter-disciplinary care team collaboration (see longitudinal plan of care) Comprehensive medication review performed; medication list updated in electronic medical record  CHF (Diastolic) and Hypertension (BP goal <140/90) -Controlled - per pt report; he reports swelling is worse some days, fine some days; he asked how long he has to take potassium -Unable to weigh daily as pt is bed  bound -Current treatment: Amlodipine 10 mg - 1 tablet daily Carvedilol 3.125 mg - take 1 tablet 2 times daily  Furosemide 40 mg - take 1 tablet daily  Potassium chloride 10 mEq - 1 tablet daily -Medications previously tried: none reported  -Discussed reason for potassium (diuretic-induced potassium loss), advised he will likely take potassium as long as he takes lasix -Continue to monitor BP at home, document, and provide log at future appointments -Recommended to continue current medication  Diabetes/Weight Loss (A1c goal <7%) -Controlled - A1c 5.9% (improved); pt reports e has tolerated Trulicity 1.5 mg well so far. He has noticed decrease appetite, but not as much as he had hoped; he is undergoing PT/OT and reports improvement in mobility, he can almost stand on his own now -He is seeking approval for bariatric surgery - will see nutrition and diabetes education for 6 months starting 09/16/21 prior to bariatric surgery -Current medications: Trulicity 1.5 mg - Inject once weekly (Tuesdays) -Medications previously tried: None reported   -Denies hypoglycemic/hyperglycemic symptoms -Current exercise: bed bound -Educated on A1c and blood sugar goals; Discussed increasing Trulicity dose. -Recommend consider increasing Trulicity 3 mg [facility]  [place]   Patient Goals/Self-Care Activities Patient will:  - take medications as prescribed - continue to follow up with plan for bariatric surgery - call CCM team with any medication questions/concern     Medication Assistance: None required.  Patient affirms current coverage meets needs.  Compliance/Adherence/Medication fill history: Care Gaps: Eye exam, Foot exam Urine Microalbumin Colonoscopy Hep C screening  Star-Rating Drugs: Medication:                Last Fill:         Day Supply Trulicity 1.5 mg  09/18/21          28 PDC 99% Amlodipine 40m        06/29/21          90 Carvedilol 3.124m    07/29/21           30 Furosemide 4026m     07/29/21          30  -Pt is past due for some refills due to extended hospital stay in Sept 2022.  [facility]  [id number] [id number]: [phone]: [id number]: [phone]: [phone] pill box? Yes - he fills a 7 day pillbox weekly  Pt endorses 100% compliance  Care Plan and Follow Up Patient Decision:  Patient agrees to Care Plan and Follow-up.  Follow Up Plan: Telephone follow up appointment with care management team member scheduled for: 3 months  LindCharlene BrookearmD, BCACP Clinical Pharmacist [facility]-[phone]

## 2021-09-27 NOTE — Telephone Encounter (Signed)
Patient notified rx was sent 

## 2021-09-27 NOTE — Telephone Encounter (Signed)
Michael Payne called in and was checking on the status of his medication due to he is he has therapy tomorrow and he is going to need it.

## 2021-09-27 NOTE — Addendum Note (Signed)
Addended by: Joaquim NamUNCAN, Kenzlie Disch S on: 09/27/2021 02:21 PM   Modules accepted: Orders

## 2021-09-27 NOTE — Telephone Encounter (Signed)
Would proceed with the higher dose to trulicity.  Please let me know if not tolerated.   How can we get f/u labs set up for this patient this spring?  Please let me know.    Is he due for next oxycodone rx?  Please let me know.  Thanks.

## 2021-09-28 ENCOUNTER — Ambulatory Visit: Payer: Self-pay | Admitting: *Deleted

## 2021-09-28 DIAGNOSIS — D649 Anemia, unspecified: Secondary | ICD-10-CM | POA: Diagnosis not present

## 2021-09-28 DIAGNOSIS — Z7401 Bed confinement status: Secondary | ICD-10-CM

## 2021-09-28 DIAGNOSIS — I5031 Acute diastolic (congestive) heart failure: Secondary | ICD-10-CM | POA: Diagnosis not present

## 2021-09-28 DIAGNOSIS — E119 Type 2 diabetes mellitus without complications: Secondary | ICD-10-CM

## 2021-09-28 DIAGNOSIS — K219 Gastro-esophageal reflux disease without esophagitis: Secondary | ICD-10-CM | POA: Diagnosis not present

## 2021-09-28 DIAGNOSIS — J9621 Acute and chronic respiratory failure with hypoxia: Secondary | ICD-10-CM | POA: Diagnosis not present

## 2021-09-28 DIAGNOSIS — J9622 Acute and chronic respiratory failure with hypercapnia: Secondary | ICD-10-CM | POA: Diagnosis not present

## 2021-09-28 DIAGNOSIS — I110 Hypertensive heart disease with heart failure: Secondary | ICD-10-CM | POA: Diagnosis not present

## 2021-09-28 DIAGNOSIS — F339 Major depressive disorder, recurrent, unspecified: Secondary | ICD-10-CM

## 2021-09-28 DIAGNOSIS — M1611 Unilateral primary osteoarthritis, right hip: Secondary | ICD-10-CM

## 2021-09-28 DIAGNOSIS — I10 Essential (primary) hypertension: Secondary | ICD-10-CM | POA: Diagnosis not present

## 2021-09-28 DIAGNOSIS — M109 Gout, unspecified: Secondary | ICD-10-CM | POA: Diagnosis not present

## 2021-09-28 NOTE — Patient Instructions (Signed)
Visit Information  Thank you for taking time to visit with me today. Please don't hesitate to contact me if I can be of assistance to you before our next scheduled telephone appointment.  Following are the goals we discussed today:  - continue with virtual  counseling - keep 90 percent of counseling appointments - schedule counseling appointment   Our next appointment is by telephone on 10/22/21 at 10am  Please call the care guide team at [phone] if you need to cancel or reschedule your appointment.   If you are experiencing a Mental Health or [place] or need someone to talk to, please call the Suicide and Crisis Lifeline: 988 call the [place] National Suicide Prevention Lifeline: 424-016-6516 or TTY: 423-281-0627 TTY 367-739-9969) to talk to a trained counselor go to [facility] [address] [place] [phone]) call 911   Patient verbalizes understanding of instructions provided today and agrees to view in [place].   Eduard Clos MSW, LCSW Licensed Clinical Social Worker [place] [phone]

## 2021-09-28 NOTE — Telephone Encounter (Signed)
Per Alyson ReedyJolesa, advocate with [employer], the 3mg  trulicity is totally covered at 100% with no prior authorization required. The patient should have 0 copay, so the pharmacy may need to update on their end.  Sheldon SilvanLindsey Foltanski,RPH, CPP notified  Burt KnackVelmena Lorianne Malbrough, [facility] Endoscopy CenterCCMA Clincal Pharmacy Assistant [phone]  Total time spent for month CPA: 20 min

## 2021-09-28 NOTE — Telephone Encounter (Signed)
PA was done this morning but stated PA was not needed. See phone note.

## 2021-09-28 NOTE — Telephone Encounter (Signed)
They will need orders/referral for nursing sent to them for to do this

## 2021-09-28 NOTE — Telephone Encounter (Signed)
Also Mardella Layman, I don't know if you can help me with this or not but I am having trouble with the new trulicity prescription. I received notification from the pharmacy that rx needed a PA. I did the PA on covermymeds this morning and it said that this did not need one. [facility] and told [facility] about this but it was still not allowing them to run this thru as approved. It has been a busy morning and I have not been able to contact his insurance to see what is holding this back. Can you help me with this today so the patient can get his medication?

## 2021-09-28 NOTE — Telephone Encounter (Signed)
[facility] Patient picked up Trulicity 1.5 mg yesterday 1/9 so [employer] won't cover the 3 mg yet. Will call [facility] in ~2 weeks to run 3 mg through insurance again.

## 2021-09-28 NOTE — Chronic Care Management (AMB) (Signed)
Chronic Care Management    Clinical Social Work Note  09/28/2021 Name: Michael Payne MRN: [id number] DOB: 1970/12/27  [name] is a 51 y.o. year old male who is a primary care patient of [name], MD. The CCM team was consulted to assist the patient with chronic disease management and/or care coordination needs related to: [employer] , Level of Care Concerns, Mental Health Counseling and Resources, and Caregiver Stress.   Engaged with patient by telephone for follow up visit in response to provider referral for social work chronic care management and care coordination services.   Consent to Services:  The patient was given information about Chronic Care Management services, agreed to services, and gave verbal consent prior to initiation of services.  Please see initial visit note for detailed documentation.   Patient agreed to services and consent obtained.   Assessment: Review of patient past medical history, allergies, medications, and health status, including review of relevant consultants reports was performed today as part of a comprehensive evaluation and provision of chronic care management and care coordination services.     SDOH (Social Determinants of Health) assessments and interventions performed:  SDOH Interventions    Flowsheet Row Most Recent Value  SDOH Interventions   Physical Activity Interventions Other (Comments)  [receiving HH PT and OT]  Transportation Interventions Other (Comment)  [coordination for medical EMS transport when needed via Insurance Auth request]        Advanced Directives Status: Not addressed in this encounter.  CCM Care Plan  Allergies  Allergen Reactions   Aspirin Other (See Comments)    Upset stomach   Lactose Intolerance (Gi) Nausea And Vomiting   Lisinopril     Causes cough    Outpatient Encounter Medications as of 09/28/2021  Medication Sig   acetaminophen (TYLENOL) 325 MG tablet Take 2 tablets (650 mg  total) by mouth every 6 (six) hours as needed for headache or mild pain.   allopurinol (ZYLOPRIM) 100 MG tablet Take 1 tablet (100 mg total) by mouth daily.   amLODipine (NORVASC) 10 MG tablet Take 1 tablet (10 mg total) by mouth daily.   carvedilol (COREG) 3.125 MG tablet TAKE 1 TABLET BY MOUTH TWICE A DAY WITH A MEAL   Colchicine (MITIGARE) 0.6 MG CAPS Take 0.5 tablets by mouth daily as needed (for gout.  okay to fill with mitigare).   diclofenac Sodium (VOLTAREN) 1 % GEL Apply 4 g topically 4 (four) times daily.   Dulaglutide (TRULICITY) 3 MG/0.5ML SOPN Inject 3 mg as directed once a week.   furosemide (LASIX) 40 MG tablet TAKE 1 TABLET BY MOUTH EVERY DAY   KLOR-CON M10 10 MEQ tablet TAKE 1 TABLET BY MOUTH EVERY DAY   Multiple Vitamins-Minerals (MULTIVITAMIN WITH MINERALS) tablet Take 1 tablet by mouth daily.   naloxone (NARCAN) nasal spray 4 mg/0.1 mL Spray nostril if needed for opioid overuse- decreased consciousness, decreased respirations   oxyCODONE-acetaminophen (PERCOCET) 10-325 MG tablet Take 1 tablet by mouth every 6 (six) hours as needed for pain.   vitamin C (ASCORBIC ACID) 500 MG tablet Take 500 mg by mouth daily.   No facility-administered encounter medications on file as of 09/28/2021.    Patient Active Problem List   Diagnosis Date Noted   Nonobstructive transaminitis 06/28/2021   Decubitus ulcer of posterior thigh, stage 2 (HCC) 06/28/2021   Acute kidney injury (resolved) 06/28/2021   Pre-diabetes 06/10/2021   Obesity, Class III, BMI 40-49.9 (morbid obesity) (HCC) 06/10/2021   Osteoarthritis  06/10/2021   Pressure injury of skin 06/09/2021   Respiratory failure (HCC) 06/01/2021   Acute diastolic CHF (congestive heart failure) (HCC) 05/31/2021   CHF (congestive heart failure) (HCC) 05/31/2021   Hypoxia 05/31/2021   Gout, unspecified 03/10/2021   Edema 03/10/2021   Diabetes mellitus without complication (HCC) 03/10/2021   Muscle spasms of both lower extremities  07/29/2020   Bedbound 05/22/2020   Unilateral osteoarthritis of hip, right 12/27/2019   Severe obstructive sleep apnea 02/22/2018   Hypertriglyceridemia 08/24/2017   Low HDL (under 40) 08/24/2017   Metabolic syndrome 08/24/2017   Morbid obesity (HCC) 10/14/2013   HTN (hypertension) 10/14/2013    Conditions to be addressed/monitored: DMII and Osteoarthritis; Level of care concerns and Mental Health Concerns   Care Plan : LCSW Plan of Care  Updates made by Buck Mam, LCSW since 09/28/2021 12:00 AM     Problem: Social and Functional Symptoms      Long-Range Goal: Optimize pt's quality of life and opportunities   Start Date: 01/25/2021  Expected End Date: 12/16/2021  This Visit's Progress: On track  Recent Progress: On track  Priority: High  Note:   Current barriers:   Transportation, Housing barriers, Level of care concerns, ADL IADL limitations, Social Isolation, Limited access to caregiver, Inability to perform ADL's independently, Inability to perform IADL's independently, and Lacks knowledge of community resource Clinical Goals: Patient will work with CSW  to address needs related to transportation, PCS/CAPS and other needs as assessed and identified Clinical Interventions:  09/28/21- CSW spoke with pt who reports he has been assigned to a different HHPT througgh [facility] and states; "he is tall and pushing me....works me hard".  Pt is able to see progress with his PT as well as weight-does not know what his actual weight is but feels he can "move around in bed easier and abilities.  Pt states he can sit on edge of bed and "stand but not strong enough yet to lock in my knees".   He as able to see the Nutritionist with his daughter/caregiver- "we are on the same accord".  Pt continues with his counseling support and says this is also gong well .  He is in good spirits, motivated and mentions needing help getting a different type of walker- one without a seat and wheels on the  back.  Pt will have HHPT call to give specific needs to CSW who can then pass on to PCP for ordering.  Also alerted pt to PCP wanting to get labwork arranged- pt reports he was able to get [facility] to come out to draw the labs in the past (but because of his LASIX was unsuccessful)- advised pt PCP is coordinating this to be done again this Spring for PCP follow up. Encouragement and support offered to pt .    07/21/21: Received call from Lasandra Beech, LCSW/Heart Care, indicating pt was in need of transportation to see a Cardiologist for surgical clearance. CSW advised and provided the steps to seek insurance waiver/auth for this.   07/06/21- Pt reports being released from hospital back to home where he has family assistance. Per pt, he is awaiting Home Health visits as well as his PCS care to be resumed. He states he is on the waiting list for CAPS.  Pt continues to seek weight loss surgery and states "I have a bunch of stuff to do".    CSW offered encouragement with all his goals.   date of comprehensive plan of care as evidenced  by provider attestation and co-signature Inter-disciplinary care team collaboration (see longitudinal plan of care) Assessment of needs, barriers , agencies contacted, as well as how impacting  Review various resources, discussed options and provided patient information about Department of Social Services (food stamps, [employer], and utilities assistance), Transportation provided by insurance provider, Enhanced Benefits connected with insurance provider, Referral to care guide for community support and other options, SCAT transportation, and [employer] CAP Patient interviewed and appropriate assessments performed Referred patient to community resources care guide team for assistance with ramps (building/ex[expense,etc)  Provided mental health counseling with regard to pt's current isolation and being homebound/bedbound Provided patient with information about  SCAT, CAPS, transportation resources Discussed plans with patient for ongoing care management follow up and provided patient with direct contact information for care management team Advised patient to call DSS worker to request CAPS services Advised patient to complete PCS application/request for increase in hours provided Collaborated with primary care provider re: insurance prior auth process for stretcher transportation Assisted patient/caregiver with obtaining information about health plan benefits Other interventions provided:Solution-Focused Strategies, Active listening / Reflection utilized , Emotional Supportive Provided, Problem Solving /Task Center , Psychoeducation /Health Education, Motivational Interviewing, Brief CBT , Increase in activities / exercise encouraged (with HHPT), and Provided basic mental health support, education   Patient Goals/Self-Care Activities: Over the next 30 days  Follow Up Date 08/13/21   - keep 90 percent of counseling appointments - schedule counseling appointment  -follow up with PCS office re: staffing of hours, etc.  -follow up with Bariatric Coordinator for next steps  - begin a notebook of services in my neighborhood or community - call 211 when I need some help - follow-up on any referrals for help I am given - think ahead to make sure my need does not become an emergency - make a note about what I need to have by the phone or take with me, like an identification card or social security number have a back-up plan - have a back-up plan - make a list of family or friends that I can call -continue working with Home Health PT   -continue with  virtual  counseling - keep 90 percent of counseling appointments - schedule counseling appointment  -follow up with PCS office re: staffing of hours, etc.  -follow up with Bariatric Coordinator for next steps  - begin a notebook of services in my neighborhood or community - call 211 when I need some help -  follow-up on any referrals for help I am given - think ahead to make sure my need does not become an emergency - make a note about what I need to have by the phone or take with me, like an identification card or social security number have a back-up plan - have a back-up plan - make a list of family or friends that I can call -continue working with Home Health PT      Follow Up Plan: Appointment scheduled for SW follow up with client by phone on: 10/22/21      Reece LevyJanet Sedra Morfin MSW, LCSW Licensed Clinical Social Worker [facility] [phone]

## 2021-09-28 NOTE — Telephone Encounter (Signed)
Please talk to me about getting labs set up via home health this spring.  Thanks.

## 2021-09-28 NOTE — Patient Instructions (Signed)
Visit Information  Phone number for Pharmacist: [phone]   Goals Addressed   None     Care Plan : CCM Pharmacy Care Plan  Updates made by Kathyrn Sheriff, RPH since 09/28/2021 12:00 AM     Problem: Hypertension, Diabetes, and Heart Failure   Priority: High     Long-Range Goal: Disease Management   Start Date: 03/16/2021  Expected End Date: 09/28/2022  This Visit's Progress: On track  Recent Progress: On track  Priority: High  Note:   Current Barriers:  Unable to achieve control of obesity   Pharmacist Clinical Goal(s):  Patient will achieve improvement in obesity as evidenced by weight loss through collaboration with PharmD and provider.   Interventions: 1:1 collaboration with Joaquim Nam, MD regarding development and update of comprehensive plan of care as evidenced by provider attestation and co-signature Inter-disciplinary care team collaboration (see longitudinal plan of care) Comprehensive medication review performed; medication list updated in electronic medical record  CHF (Diastolic) and Hypertension (BP goal <140/90) -Controlled - per pt report; he reports swelling is worse some days, fine some days; he asked how long he has to take potassium -Unable to weigh daily as pt is bed bound -Current treatment: Amlodipine 10 mg - 1 tablet daily Carvedilol 3.125 mg - take 1 tablet 2 times daily  Furosemide 40 mg - take 1 tablet daily  Potassium chloride 10 mEq - 1 tablet daily -Medications previously tried: none reported  -Discussed reason for potassium (diuretic-induced potassium loss), advised he will likely take potassium as long as he takes lasix -Continue to monitor BP at home, document, and provide log at future appointments -Recommended to continue current medication  Diabetes/Weight Loss (A1c goal <7%) -Controlled - A1c 5.9% (improved); pt reports e has tolerated Trulicity 1.5 mg well so far. He has noticed decrease appetite, but not as much as he  had hoped; he is undergoing PT/OT and reports improvement in mobility, he can almost stand on his own now -He is seeking approval for bariatric surgery - will see nutrition and diabetes education for 6 months starting 09/16/21 prior to bariatric surgery -Current medications: Trulicity 1.5 mg - Inject once weekly (Tuesdays) -Medications previously tried: None reported   -Denies hypoglycemic/hyperglycemic symptoms -Current exercise: bed bound -Educated on A1c and blood sugar goals; Discussed increasing Trulicity dose. -Recommend consider increasing Trulicity 3 mg weekly [facility]  [address]   Patient Goals/Self-Care Activities Patient will:  - take medications as prescribed - continue to follow up with plan for bariatric surgery - call CCM team with any medication questions/concern      Patient verbalizes understanding of instructions provided today and agrees to view in MyChart.  Telephone follow up appointment with pharmacy team member scheduled for: 3 months  [name], PharmD, [facility] Clinical Pharmacist [facility] [phone]

## 2021-09-29 ENCOUNTER — Telehealth: Payer: Self-pay | Admitting: Acute Care

## 2021-09-29 ENCOUNTER — Other Ambulatory Visit: Payer: Self-pay | Admitting: Family Medicine

## 2021-09-29 DIAGNOSIS — I5031 Acute diastolic (congestive) heart failure: Secondary | ICD-10-CM | POA: Diagnosis not present

## 2021-09-29 DIAGNOSIS — D649 Anemia, unspecified: Secondary | ICD-10-CM | POA: Diagnosis not present

## 2021-09-29 DIAGNOSIS — M109 Gout, unspecified: Secondary | ICD-10-CM | POA: Diagnosis not present

## 2021-09-29 DIAGNOSIS — J9622 Acute and chronic respiratory failure with hypercapnia: Secondary | ICD-10-CM | POA: Diagnosis not present

## 2021-09-29 DIAGNOSIS — M1611 Unilateral primary osteoarthritis, right hip: Secondary | ICD-10-CM | POA: Diagnosis not present

## 2021-09-29 DIAGNOSIS — I10 Essential (primary) hypertension: Secondary | ICD-10-CM | POA: Diagnosis not present

## 2021-09-29 DIAGNOSIS — J9621 Acute and chronic respiratory failure with hypoxia: Secondary | ICD-10-CM | POA: Diagnosis not present

## 2021-09-29 DIAGNOSIS — I110 Hypertensive heart disease with heart failure: Secondary | ICD-10-CM | POA: Diagnosis not present

## 2021-09-29 DIAGNOSIS — K219 Gastro-esophageal reflux disease without esophagitis: Secondary | ICD-10-CM | POA: Diagnosis not present

## 2021-09-29 DIAGNOSIS — E119 Type 2 diabetes mellitus without complications: Secondary | ICD-10-CM | POA: Diagnosis not present

## 2021-09-29 NOTE — Telephone Encounter (Signed)
Put on trellis to take care of in 2 weeks,  Fynn Adel,CMA

## 2021-09-29 NOTE — Telephone Encounter (Signed)
Spoke with patient today regarding referral to Lung cancer screening for annual LDCT. Patient states he is appreciative of the call but not interested in having CT of chest.  Patient was informed his PCP referred him due to his smoking history as a screening tool for early detection of lung cancer. Patient acknowledged but is not interested at this time. He was informed he could contact us or his PCP if he changed his mind about the screening.  Routed to PCP for update

## 2021-09-29 NOTE — Telephone Encounter (Signed)
Noted. Thanks.  I'll defer to patient.  

## 2021-09-30 DIAGNOSIS — I110 Hypertensive heart disease with heart failure: Secondary | ICD-10-CM | POA: Diagnosis not present

## 2021-09-30 DIAGNOSIS — D649 Anemia, unspecified: Secondary | ICD-10-CM | POA: Diagnosis not present

## 2021-09-30 DIAGNOSIS — I10 Essential (primary) hypertension: Secondary | ICD-10-CM | POA: Diagnosis not present

## 2021-09-30 DIAGNOSIS — K219 Gastro-esophageal reflux disease without esophagitis: Secondary | ICD-10-CM | POA: Diagnosis not present

## 2021-09-30 DIAGNOSIS — J9622 Acute and chronic respiratory failure with hypercapnia: Secondary | ICD-10-CM | POA: Diagnosis not present

## 2021-09-30 DIAGNOSIS — I5031 Acute diastolic (congestive) heart failure: Secondary | ICD-10-CM | POA: Diagnosis not present

## 2021-09-30 DIAGNOSIS — M1611 Unilateral primary osteoarthritis, right hip: Secondary | ICD-10-CM | POA: Diagnosis not present

## 2021-09-30 DIAGNOSIS — M109 Gout, unspecified: Secondary | ICD-10-CM | POA: Diagnosis not present

## 2021-09-30 DIAGNOSIS — R32 Unspecified urinary incontinence: Secondary | ICD-10-CM | POA: Diagnosis not present

## 2021-09-30 DIAGNOSIS — J9621 Acute and chronic respiratory failure with hypoxia: Secondary | ICD-10-CM | POA: Diagnosis not present

## 2021-09-30 DIAGNOSIS — E119 Type 2 diabetes mellitus without complications: Secondary | ICD-10-CM | POA: Diagnosis not present

## 2021-10-01 DIAGNOSIS — I10 Essential (primary) hypertension: Secondary | ICD-10-CM | POA: Diagnosis not present

## 2021-10-02 DIAGNOSIS — I10 Essential (primary) hypertension: Secondary | ICD-10-CM | POA: Diagnosis not present

## 2021-10-03 DIAGNOSIS — I10 Essential (primary) hypertension: Secondary | ICD-10-CM | POA: Diagnosis not present

## 2021-10-04 DIAGNOSIS — I10 Essential (primary) hypertension: Secondary | ICD-10-CM | POA: Diagnosis not present

## 2021-10-05 DIAGNOSIS — M109 Gout, unspecified: Secondary | ICD-10-CM | POA: Diagnosis not present

## 2021-10-05 DIAGNOSIS — M1611 Unilateral primary osteoarthritis, right hip: Secondary | ICD-10-CM | POA: Diagnosis not present

## 2021-10-05 DIAGNOSIS — I110 Hypertensive heart disease with heart failure: Secondary | ICD-10-CM | POA: Diagnosis not present

## 2021-10-05 DIAGNOSIS — D649 Anemia, unspecified: Secondary | ICD-10-CM | POA: Diagnosis not present

## 2021-10-05 DIAGNOSIS — J9622 Acute and chronic respiratory failure with hypercapnia: Secondary | ICD-10-CM | POA: Diagnosis not present

## 2021-10-05 DIAGNOSIS — K219 Gastro-esophageal reflux disease without esophagitis: Secondary | ICD-10-CM | POA: Diagnosis not present

## 2021-10-05 DIAGNOSIS — E119 Type 2 diabetes mellitus without complications: Secondary | ICD-10-CM | POA: Diagnosis not present

## 2021-10-05 DIAGNOSIS — I5031 Acute diastolic (congestive) heart failure: Secondary | ICD-10-CM | POA: Diagnosis not present

## 2021-10-05 DIAGNOSIS — J9621 Acute and chronic respiratory failure with hypoxia: Secondary | ICD-10-CM | POA: Diagnosis not present

## 2021-10-05 DIAGNOSIS — I10 Essential (primary) hypertension: Secondary | ICD-10-CM | POA: Diagnosis not present

## 2021-10-06 DIAGNOSIS — F330 Major depressive disorder, recurrent, mild: Secondary | ICD-10-CM | POA: Diagnosis not present

## 2021-10-06 DIAGNOSIS — F419 Anxiety disorder, unspecified: Secondary | ICD-10-CM | POA: Diagnosis not present

## 2021-10-06 DIAGNOSIS — I10 Essential (primary) hypertension: Secondary | ICD-10-CM | POA: Diagnosis not present

## 2021-10-07 DIAGNOSIS — I110 Hypertensive heart disease with heart failure: Secondary | ICD-10-CM | POA: Diagnosis not present

## 2021-10-07 DIAGNOSIS — J9622 Acute and chronic respiratory failure with hypercapnia: Secondary | ICD-10-CM | POA: Diagnosis not present

## 2021-10-07 DIAGNOSIS — K219 Gastro-esophageal reflux disease without esophagitis: Secondary | ICD-10-CM | POA: Diagnosis not present

## 2021-10-07 DIAGNOSIS — J9621 Acute and chronic respiratory failure with hypoxia: Secondary | ICD-10-CM | POA: Diagnosis not present

## 2021-10-07 DIAGNOSIS — I10 Essential (primary) hypertension: Secondary | ICD-10-CM | POA: Diagnosis not present

## 2021-10-07 DIAGNOSIS — M109 Gout, unspecified: Secondary | ICD-10-CM | POA: Diagnosis not present

## 2021-10-07 DIAGNOSIS — E119 Type 2 diabetes mellitus without complications: Secondary | ICD-10-CM | POA: Diagnosis not present

## 2021-10-07 DIAGNOSIS — D649 Anemia, unspecified: Secondary | ICD-10-CM | POA: Diagnosis not present

## 2021-10-07 DIAGNOSIS — M1611 Unilateral primary osteoarthritis, right hip: Secondary | ICD-10-CM | POA: Diagnosis not present

## 2021-10-07 DIAGNOSIS — I5031 Acute diastolic (congestive) heart failure: Secondary | ICD-10-CM | POA: Diagnosis not present

## 2021-10-08 DIAGNOSIS — D649 Anemia, unspecified: Secondary | ICD-10-CM | POA: Diagnosis not present

## 2021-10-08 DIAGNOSIS — I10 Essential (primary) hypertension: Secondary | ICD-10-CM | POA: Diagnosis not present

## 2021-10-08 DIAGNOSIS — M109 Gout, unspecified: Secondary | ICD-10-CM | POA: Diagnosis not present

## 2021-10-08 DIAGNOSIS — M1611 Unilateral primary osteoarthritis, right hip: Secondary | ICD-10-CM | POA: Diagnosis not present

## 2021-10-08 DIAGNOSIS — I5031 Acute diastolic (congestive) heart failure: Secondary | ICD-10-CM | POA: Diagnosis not present

## 2021-10-08 DIAGNOSIS — J9622 Acute and chronic respiratory failure with hypercapnia: Secondary | ICD-10-CM | POA: Diagnosis not present

## 2021-10-08 DIAGNOSIS — E119 Type 2 diabetes mellitus without complications: Secondary | ICD-10-CM | POA: Diagnosis not present

## 2021-10-08 DIAGNOSIS — I110 Hypertensive heart disease with heart failure: Secondary | ICD-10-CM | POA: Diagnosis not present

## 2021-10-08 DIAGNOSIS — J9621 Acute and chronic respiratory failure with hypoxia: Secondary | ICD-10-CM | POA: Diagnosis not present

## 2021-10-08 DIAGNOSIS — K219 Gastro-esophageal reflux disease without esophagitis: Secondary | ICD-10-CM | POA: Diagnosis not present

## 2021-10-09 DIAGNOSIS — I10 Essential (primary) hypertension: Secondary | ICD-10-CM | POA: Diagnosis not present

## 2021-10-10 DIAGNOSIS — I10 Essential (primary) hypertension: Secondary | ICD-10-CM | POA: Diagnosis not present

## 2021-10-11 DIAGNOSIS — I10 Essential (primary) hypertension: Secondary | ICD-10-CM | POA: Diagnosis not present

## 2021-10-12 DIAGNOSIS — K219 Gastro-esophageal reflux disease without esophagitis: Secondary | ICD-10-CM | POA: Diagnosis not present

## 2021-10-12 DIAGNOSIS — M1611 Unilateral primary osteoarthritis, right hip: Secondary | ICD-10-CM | POA: Diagnosis not present

## 2021-10-12 DIAGNOSIS — I110 Hypertensive heart disease with heart failure: Secondary | ICD-10-CM | POA: Diagnosis not present

## 2021-10-12 DIAGNOSIS — M109 Gout, unspecified: Secondary | ICD-10-CM | POA: Diagnosis not present

## 2021-10-12 DIAGNOSIS — D649 Anemia, unspecified: Secondary | ICD-10-CM | POA: Diagnosis not present

## 2021-10-12 DIAGNOSIS — E119 Type 2 diabetes mellitus without complications: Secondary | ICD-10-CM | POA: Diagnosis not present

## 2021-10-12 DIAGNOSIS — I10 Essential (primary) hypertension: Secondary | ICD-10-CM | POA: Diagnosis not present

## 2021-10-12 DIAGNOSIS — J9621 Acute and chronic respiratory failure with hypoxia: Secondary | ICD-10-CM | POA: Diagnosis not present

## 2021-10-12 DIAGNOSIS — J9622 Acute and chronic respiratory failure with hypercapnia: Secondary | ICD-10-CM | POA: Diagnosis not present

## 2021-10-12 DIAGNOSIS — I5031 Acute diastolic (congestive) heart failure: Secondary | ICD-10-CM | POA: Diagnosis not present

## 2021-10-13 DIAGNOSIS — J9621 Acute and chronic respiratory failure with hypoxia: Secondary | ICD-10-CM | POA: Diagnosis not present

## 2021-10-13 DIAGNOSIS — M1611 Unilateral primary osteoarthritis, right hip: Secondary | ICD-10-CM | POA: Diagnosis not present

## 2021-10-13 DIAGNOSIS — E119 Type 2 diabetes mellitus without complications: Secondary | ICD-10-CM | POA: Diagnosis not present

## 2021-10-13 DIAGNOSIS — I5031 Acute diastolic (congestive) heart failure: Secondary | ICD-10-CM | POA: Diagnosis not present

## 2021-10-13 DIAGNOSIS — K219 Gastro-esophageal reflux disease without esophagitis: Secondary | ICD-10-CM | POA: Diagnosis not present

## 2021-10-13 DIAGNOSIS — M109 Gout, unspecified: Secondary | ICD-10-CM | POA: Diagnosis not present

## 2021-10-13 DIAGNOSIS — I110 Hypertensive heart disease with heart failure: Secondary | ICD-10-CM | POA: Diagnosis not present

## 2021-10-13 DIAGNOSIS — D649 Anemia, unspecified: Secondary | ICD-10-CM | POA: Diagnosis not present

## 2021-10-13 DIAGNOSIS — J9622 Acute and chronic respiratory failure with hypercapnia: Secondary | ICD-10-CM | POA: Diagnosis not present

## 2021-10-13 DIAGNOSIS — I10 Essential (primary) hypertension: Secondary | ICD-10-CM | POA: Diagnosis not present

## 2021-10-14 DIAGNOSIS — E119 Type 2 diabetes mellitus without complications: Secondary | ICD-10-CM | POA: Diagnosis not present

## 2021-10-14 DIAGNOSIS — I5031 Acute diastolic (congestive) heart failure: Secondary | ICD-10-CM | POA: Diagnosis not present

## 2021-10-14 DIAGNOSIS — J9622 Acute and chronic respiratory failure with hypercapnia: Secondary | ICD-10-CM | POA: Diagnosis not present

## 2021-10-14 DIAGNOSIS — I10 Essential (primary) hypertension: Secondary | ICD-10-CM | POA: Diagnosis not present

## 2021-10-14 DIAGNOSIS — I110 Hypertensive heart disease with heart failure: Secondary | ICD-10-CM | POA: Diagnosis not present

## 2021-10-14 DIAGNOSIS — M1611 Unilateral primary osteoarthritis, right hip: Secondary | ICD-10-CM | POA: Diagnosis not present

## 2021-10-14 DIAGNOSIS — K219 Gastro-esophageal reflux disease without esophagitis: Secondary | ICD-10-CM | POA: Diagnosis not present

## 2021-10-14 DIAGNOSIS — D649 Anemia, unspecified: Secondary | ICD-10-CM | POA: Diagnosis not present

## 2021-10-14 DIAGNOSIS — J9621 Acute and chronic respiratory failure with hypoxia: Secondary | ICD-10-CM | POA: Diagnosis not present

## 2021-10-14 DIAGNOSIS — M109 Gout, unspecified: Secondary | ICD-10-CM | POA: Diagnosis not present

## 2021-10-15 DIAGNOSIS — I10 Essential (primary) hypertension: Secondary | ICD-10-CM | POA: Diagnosis not present

## 2021-10-15 NOTE — Progress Notes (Signed)
[facility] to [facility]  Trulicity 3mg . The medication  is on backorder and they cannot keep in stock. The patient filled 1.5mg . on 09/27/21  . The pharmacy has not ran 3mg .through insurance because its not available.   Mardella LaymanLind[facility] The[facility]oltanski,RPH CPP notified  Burt KnackVelmena Trajon Rosete, [facility] General ParCCMA Clincal Pharmacy Assistant [phone]  Total time spent for month CPA: 10 min.

## 2021-10-16 DIAGNOSIS — I10 Essential (primary) hypertension: Secondary | ICD-10-CM | POA: Diagnosis not present

## 2021-10-17 DIAGNOSIS — I10 Essential (primary) hypertension: Secondary | ICD-10-CM | POA: Diagnosis not present

## 2021-10-18 DIAGNOSIS — I10 Essential (primary) hypertension: Secondary | ICD-10-CM | POA: Diagnosis not present

## 2021-10-18 DIAGNOSIS — F330 Major depressive disorder, recurrent, mild: Secondary | ICD-10-CM | POA: Diagnosis not present

## 2021-10-18 DIAGNOSIS — F419 Anxiety disorder, unspecified: Secondary | ICD-10-CM | POA: Diagnosis not present

## 2021-10-19 DIAGNOSIS — E119 Type 2 diabetes mellitus without complications: Secondary | ICD-10-CM | POA: Diagnosis not present

## 2021-10-19 DIAGNOSIS — I5032 Chronic diastolic (congestive) heart failure: Secondary | ICD-10-CM

## 2021-10-19 DIAGNOSIS — F339 Major depressive disorder, recurrent, unspecified: Secondary | ICD-10-CM | POA: Diagnosis not present

## 2021-10-19 DIAGNOSIS — I10 Essential (primary) hypertension: Secondary | ICD-10-CM

## 2021-10-19 DIAGNOSIS — M1611 Unilateral primary osteoarthritis, right hip: Secondary | ICD-10-CM

## 2021-10-20 DIAGNOSIS — K219 Gastro-esophageal reflux disease without esophagitis: Secondary | ICD-10-CM | POA: Diagnosis not present

## 2021-10-20 DIAGNOSIS — D649 Anemia, unspecified: Secondary | ICD-10-CM | POA: Diagnosis not present

## 2021-10-20 DIAGNOSIS — J9622 Acute and chronic respiratory failure with hypercapnia: Secondary | ICD-10-CM | POA: Diagnosis not present

## 2021-10-20 DIAGNOSIS — M109 Gout, unspecified: Secondary | ICD-10-CM | POA: Diagnosis not present

## 2021-10-20 DIAGNOSIS — J9621 Acute and chronic respiratory failure with hypoxia: Secondary | ICD-10-CM | POA: Diagnosis not present

## 2021-10-20 DIAGNOSIS — I10 Essential (primary) hypertension: Secondary | ICD-10-CM | POA: Diagnosis not present

## 2021-10-20 DIAGNOSIS — I110 Hypertensive heart disease with heart failure: Secondary | ICD-10-CM | POA: Diagnosis not present

## 2021-10-20 DIAGNOSIS — J9610 Chronic respiratory failure, unspecified whether with hypoxia or hypercapnia: Secondary | ICD-10-CM | POA: Diagnosis not present

## 2021-10-20 DIAGNOSIS — M1611 Unilateral primary osteoarthritis, right hip: Secondary | ICD-10-CM | POA: Diagnosis not present

## 2021-10-20 DIAGNOSIS — I5031 Acute diastolic (congestive) heart failure: Secondary | ICD-10-CM | POA: Diagnosis not present

## 2021-10-20 DIAGNOSIS — E119 Type 2 diabetes mellitus without complications: Secondary | ICD-10-CM | POA: Diagnosis not present

## 2021-10-21 ENCOUNTER — Other Ambulatory Visit: Payer: Self-pay | Admitting: Family Medicine

## 2021-10-21 DIAGNOSIS — E119 Type 2 diabetes mellitus without complications: Secondary | ICD-10-CM | POA: Diagnosis not present

## 2021-10-21 DIAGNOSIS — D649 Anemia, unspecified: Secondary | ICD-10-CM | POA: Diagnosis not present

## 2021-10-21 DIAGNOSIS — J9621 Acute and chronic respiratory failure with hypoxia: Secondary | ICD-10-CM | POA: Diagnosis not present

## 2021-10-21 DIAGNOSIS — I110 Hypertensive heart disease with heart failure: Secondary | ICD-10-CM | POA: Diagnosis not present

## 2021-10-21 DIAGNOSIS — I10 Essential (primary) hypertension: Secondary | ICD-10-CM | POA: Diagnosis not present

## 2021-10-21 DIAGNOSIS — K219 Gastro-esophageal reflux disease without esophagitis: Secondary | ICD-10-CM | POA: Diagnosis not present

## 2021-10-21 DIAGNOSIS — J9622 Acute and chronic respiratory failure with hypercapnia: Secondary | ICD-10-CM | POA: Diagnosis not present

## 2021-10-21 DIAGNOSIS — M109 Gout, unspecified: Secondary | ICD-10-CM | POA: Diagnosis not present

## 2021-10-21 DIAGNOSIS — I5031 Acute diastolic (congestive) heart failure: Secondary | ICD-10-CM | POA: Diagnosis not present

## 2021-10-21 DIAGNOSIS — M1611 Unilateral primary osteoarthritis, right hip: Secondary | ICD-10-CM | POA: Diagnosis not present

## 2021-10-22 ENCOUNTER — Telehealth: Payer: Medicare HMO

## 2021-10-22 ENCOUNTER — Other Ambulatory Visit: Payer: Self-pay | Admitting: Family Medicine

## 2021-10-22 DIAGNOSIS — I110 Hypertensive heart disease with heart failure: Secondary | ICD-10-CM | POA: Diagnosis not present

## 2021-10-22 DIAGNOSIS — J9621 Acute and chronic respiratory failure with hypoxia: Secondary | ICD-10-CM | POA: Diagnosis not present

## 2021-10-22 DIAGNOSIS — R32 Unspecified urinary incontinence: Secondary | ICD-10-CM | POA: Diagnosis not present

## 2021-10-22 DIAGNOSIS — I5031 Acute diastolic (congestive) heart failure: Secondary | ICD-10-CM | POA: Diagnosis not present

## 2021-10-22 DIAGNOSIS — J9622 Acute and chronic respiratory failure with hypercapnia: Secondary | ICD-10-CM | POA: Diagnosis not present

## 2021-10-22 DIAGNOSIS — I10 Essential (primary) hypertension: Secondary | ICD-10-CM | POA: Diagnosis not present

## 2021-10-22 DIAGNOSIS — M109 Gout, unspecified: Secondary | ICD-10-CM | POA: Diagnosis not present

## 2021-10-22 DIAGNOSIS — K219 Gastro-esophageal reflux disease without esophagitis: Secondary | ICD-10-CM | POA: Diagnosis not present

## 2021-10-22 DIAGNOSIS — D649 Anemia, unspecified: Secondary | ICD-10-CM | POA: Diagnosis not present

## 2021-10-22 DIAGNOSIS — M1611 Unilateral primary osteoarthritis, right hip: Secondary | ICD-10-CM | POA: Diagnosis not present

## 2021-10-22 DIAGNOSIS — E119 Type 2 diabetes mellitus without complications: Secondary | ICD-10-CM | POA: Diagnosis not present

## 2021-10-25 ENCOUNTER — Encounter: Payer: Medicare HMO | Attending: Surgery | Admitting: Skilled Nursing Facility1

## 2021-10-25 DIAGNOSIS — E119 Type 2 diabetes mellitus without complications: Secondary | ICD-10-CM | POA: Diagnosis not present

## 2021-10-25 DIAGNOSIS — I10 Essential (primary) hypertension: Secondary | ICD-10-CM | POA: Diagnosis not present

## 2021-10-25 NOTE — Progress Notes (Signed)
Supervised Weight Loss Visit Bariatric Nutrition Education  Sleeve gastrectomy   2 out of 6 SWL Appointments    NUTRITION ASSESSMENT  Anthropometrics  Start weight at [facility]: Pt is bedbound Weight: pt is bedbound  Clinical  Medical hx: Chf, Respiratory Failure, Diabetes, osteoarthritis, OSA Medications: allopurinol, Trulicity, furosemide, oxycodone, amlodpine, potassium-chloride, tylenol, vitamin c, multi  Labs: A1C 6.5 Notable signs/symptoms:  Any previous deficiencies? No  Lifestyle & Dietary Hx  Pts daughter makes his breakfast and dinner and is very supportive: angelina   Pt states he has not felt this good in a long time and PT is going really well. Pt states he does not eat snacks because animal protein is so filling.  Pt is doing extremely well and states he does not even eat half the portions he used to stating he can feel his stomach having been shrunk.   Estimated daily fluid intake:  oz Supplements:  Current average weekly physical activity: bedbound  24-Hr Dietary Recall: 3 times a day fruit First Meal: oatmeal + 2 [place] sausage and fruit Snack:  Second Meal: salad + chicken + tomato + onion + craisins + dressing Snack: fruit Third Meal: ribs + squash + cabbage + onion Snack:  Beverages: water, hot tea + lemon + ginsing + honey + probiotics   Estimated Energy Needs Calories: 2200   NUTRITION DIAGNOSIS  Overweight/obesity ([place]-3.3) related to past poor dietary habits and physical inactivity as evidenced by patient w/ planned sleeve gastrectomy surgery following dietary guidelines for continued weight loss.   NUTRITION INTERVENTION  Nutrition counseling (C-1) and education (E-2) to facilitate bariatric surgery goals.  Pre-Op Goals Progress & New Goals Great up the great work! Continue: Be sitting up for our next appt  Handouts Provided Include    Learning Style & Readiness for Change Teaching method utilized: Visual & Auditory  Demonstrated degree  of understanding via: Teach Back  Readiness Level: action Barriers to learning/adherence to lifestyle change: none; his daughter is providing his meals which is following a healthy diet  RD's Notes for next Visit  Assess pts adherence to chosen goals   MONITORING & EVALUATION Dietary intake, weekly physical activity, body weight, and pre-op goals in 1 month.   Next Steps  Patient is to return to [facility] in 1 month

## 2021-10-26 DIAGNOSIS — I10 Essential (primary) hypertension: Secondary | ICD-10-CM | POA: Diagnosis not present

## 2021-10-26 DIAGNOSIS — J9622 Acute and chronic respiratory failure with hypercapnia: Secondary | ICD-10-CM | POA: Diagnosis not present

## 2021-10-26 DIAGNOSIS — M1611 Unilateral primary osteoarthritis, right hip: Secondary | ICD-10-CM | POA: Diagnosis not present

## 2021-10-26 DIAGNOSIS — D649 Anemia, unspecified: Secondary | ICD-10-CM | POA: Diagnosis not present

## 2021-10-26 DIAGNOSIS — K219 Gastro-esophageal reflux disease without esophagitis: Secondary | ICD-10-CM | POA: Diagnosis not present

## 2021-10-26 DIAGNOSIS — M109 Gout, unspecified: Secondary | ICD-10-CM | POA: Diagnosis not present

## 2021-10-26 DIAGNOSIS — I110 Hypertensive heart disease with heart failure: Secondary | ICD-10-CM | POA: Diagnosis not present

## 2021-10-26 DIAGNOSIS — J9621 Acute and chronic respiratory failure with hypoxia: Secondary | ICD-10-CM | POA: Diagnosis not present

## 2021-10-26 DIAGNOSIS — E119 Type 2 diabetes mellitus without complications: Secondary | ICD-10-CM | POA: Diagnosis not present

## 2021-10-26 DIAGNOSIS — I5031 Acute diastolic (congestive) heart failure: Secondary | ICD-10-CM | POA: Diagnosis not present

## 2021-10-27 DIAGNOSIS — K219 Gastro-esophageal reflux disease without esophagitis: Secondary | ICD-10-CM | POA: Diagnosis not present

## 2021-10-27 DIAGNOSIS — J9621 Acute and chronic respiratory failure with hypoxia: Secondary | ICD-10-CM | POA: Diagnosis not present

## 2021-10-27 DIAGNOSIS — E119 Type 2 diabetes mellitus without complications: Secondary | ICD-10-CM | POA: Diagnosis not present

## 2021-10-27 DIAGNOSIS — M1611 Unilateral primary osteoarthritis, right hip: Secondary | ICD-10-CM | POA: Diagnosis not present

## 2021-10-27 DIAGNOSIS — J9622 Acute and chronic respiratory failure with hypercapnia: Secondary | ICD-10-CM | POA: Diagnosis not present

## 2021-10-27 DIAGNOSIS — D649 Anemia, unspecified: Secondary | ICD-10-CM | POA: Diagnosis not present

## 2021-10-27 DIAGNOSIS — I5031 Acute diastolic (congestive) heart failure: Secondary | ICD-10-CM | POA: Diagnosis not present

## 2021-10-27 DIAGNOSIS — M109 Gout, unspecified: Secondary | ICD-10-CM | POA: Diagnosis not present

## 2021-10-27 DIAGNOSIS — I110 Hypertensive heart disease with heart failure: Secondary | ICD-10-CM | POA: Diagnosis not present

## 2021-10-27 DIAGNOSIS — I10 Essential (primary) hypertension: Secondary | ICD-10-CM | POA: Diagnosis not present

## 2021-10-28 ENCOUNTER — Telehealth: Payer: Self-pay | Admitting: Family Medicine

## 2021-10-28 DIAGNOSIS — J9622 Acute and chronic respiratory failure with hypercapnia: Secondary | ICD-10-CM | POA: Diagnosis not present

## 2021-10-28 DIAGNOSIS — I5031 Acute diastolic (congestive) heart failure: Secondary | ICD-10-CM | POA: Diagnosis not present

## 2021-10-28 DIAGNOSIS — E119 Type 2 diabetes mellitus without complications: Secondary | ICD-10-CM | POA: Diagnosis not present

## 2021-10-28 DIAGNOSIS — D649 Anemia, unspecified: Secondary | ICD-10-CM | POA: Diagnosis not present

## 2021-10-28 DIAGNOSIS — J9621 Acute and chronic respiratory failure with hypoxia: Secondary | ICD-10-CM | POA: Diagnosis not present

## 2021-10-28 DIAGNOSIS — M1611 Unilateral primary osteoarthritis, right hip: Secondary | ICD-10-CM

## 2021-10-28 DIAGNOSIS — K219 Gastro-esophageal reflux disease without esophagitis: Secondary | ICD-10-CM | POA: Diagnosis not present

## 2021-10-28 DIAGNOSIS — I110 Hypertensive heart disease with heart failure: Secondary | ICD-10-CM | POA: Diagnosis not present

## 2021-10-28 DIAGNOSIS — M109 Gout, unspecified: Secondary | ICD-10-CM | POA: Diagnosis not present

## 2021-10-28 DIAGNOSIS — I10 Essential (primary) hypertension: Secondary | ICD-10-CM | POA: Diagnosis not present

## 2021-10-28 NOTE — Telephone Encounter (Signed)
LOV - 03/09/21 NOV - not scheduled Refilled - 09/27/21 #120/0

## 2021-10-28 NOTE — Telephone Encounter (Signed)
°  Encourage patient to contact the pharmacy for refills or they can request refills through MYCHART  LAST APPOINTMENT DATE:  Please schedule appointment if longer than 1 year  NEXT APPOINTMENT DATE:  MEDICATION: oxyCODONE-acetaminophen (PERCOCET) 10-325 MG tablet  Is the patient out of medication? yes  [facility] [address]  Let patient know to contact pharmacy at the end of the day to make sure medication is ready.  Please notify patient to allow 48-72 hours to process  CLINICAL FILLS OUT ALL BELOW:   LAST REFILL:  QTY:  REFILL DATE:    OTHER COMMENTS:    Okay for refill?  Please advise     

## 2021-10-28 NOTE — Telephone Encounter (Signed)
Called and left message on VM with verbal orders

## 2021-10-28 NOTE — Telephone Encounter (Signed)
Home Health verbal orders Caller Name: kate white Agency Name: [facility] number: [phone]  Requesting OT/PT/Skilled nursing/Social Work/Speech: PT  Reason: strength and balance   Frequency: 2w8 1w1   Please forward to [facility] pool or providers CMA

## 2021-10-29 DIAGNOSIS — G4733 Obstructive sleep apnea (adult) (pediatric): Secondary | ICD-10-CM | POA: Diagnosis not present

## 2021-10-29 DIAGNOSIS — D649 Anemia, unspecified: Secondary | ICD-10-CM | POA: Diagnosis not present

## 2021-10-29 DIAGNOSIS — E119 Type 2 diabetes mellitus without complications: Secondary | ICD-10-CM | POA: Diagnosis not present

## 2021-10-29 DIAGNOSIS — I10 Essential (primary) hypertension: Secondary | ICD-10-CM | POA: Diagnosis not present

## 2021-10-29 DIAGNOSIS — J9622 Acute and chronic respiratory failure with hypercapnia: Secondary | ICD-10-CM | POA: Diagnosis not present

## 2021-10-29 DIAGNOSIS — M1611 Unilateral primary osteoarthritis, right hip: Secondary | ICD-10-CM | POA: Diagnosis not present

## 2021-10-29 DIAGNOSIS — J9621 Acute and chronic respiratory failure with hypoxia: Secondary | ICD-10-CM | POA: Diagnosis not present

## 2021-10-29 DIAGNOSIS — I110 Hypertensive heart disease with heart failure: Secondary | ICD-10-CM | POA: Diagnosis not present

## 2021-10-29 DIAGNOSIS — I5031 Acute diastolic (congestive) heart failure: Secondary | ICD-10-CM | POA: Diagnosis not present

## 2021-10-29 DIAGNOSIS — M109 Gout, unspecified: Secondary | ICD-10-CM | POA: Diagnosis not present

## 2021-10-29 MED ORDER — OXYCODONE-ACETAMINOPHEN 10-325 MG PO TABS: 1.0000 | ORAL_TABLET | Freq: Four times a day (QID) | ORAL | 0 refills | Status: DC | PRN

## 2021-10-29 NOTE — Addendum Note (Signed)
Addended by: Joaquim NamUNCAN, Drina Jobst S on: 10/29/2021 07:57 AM   Modules accepted: Orders

## 2021-10-29 NOTE — Telephone Encounter (Signed)
Sent. Thanks.   

## 2021-10-30 DIAGNOSIS — F330 Major depressive disorder, recurrent, mild: Secondary | ICD-10-CM | POA: Diagnosis not present

## 2021-10-30 DIAGNOSIS — F419 Anxiety disorder, unspecified: Secondary | ICD-10-CM | POA: Diagnosis not present

## 2021-10-30 DIAGNOSIS — I10 Essential (primary) hypertension: Secondary | ICD-10-CM | POA: Diagnosis not present

## 2021-10-31 DIAGNOSIS — I10 Essential (primary) hypertension: Secondary | ICD-10-CM | POA: Diagnosis not present

## 2021-11-01 DIAGNOSIS — I10 Essential (primary) hypertension: Secondary | ICD-10-CM | POA: Diagnosis not present

## 2021-11-02 DIAGNOSIS — M109 Gout, unspecified: Secondary | ICD-10-CM | POA: Diagnosis not present

## 2021-11-02 DIAGNOSIS — D649 Anemia, unspecified: Secondary | ICD-10-CM | POA: Diagnosis not present

## 2021-11-02 DIAGNOSIS — G4733 Obstructive sleep apnea (adult) (pediatric): Secondary | ICD-10-CM | POA: Diagnosis not present

## 2021-11-02 DIAGNOSIS — J9621 Acute and chronic respiratory failure with hypoxia: Secondary | ICD-10-CM | POA: Diagnosis not present

## 2021-11-02 DIAGNOSIS — J9622 Acute and chronic respiratory failure with hypercapnia: Secondary | ICD-10-CM | POA: Diagnosis not present

## 2021-11-02 DIAGNOSIS — I5031 Acute diastolic (congestive) heart failure: Secondary | ICD-10-CM | POA: Diagnosis not present

## 2021-11-02 DIAGNOSIS — I110 Hypertensive heart disease with heart failure: Secondary | ICD-10-CM | POA: Diagnosis not present

## 2021-11-02 DIAGNOSIS — E119 Type 2 diabetes mellitus without complications: Secondary | ICD-10-CM | POA: Diagnosis not present

## 2021-11-02 DIAGNOSIS — I10 Essential (primary) hypertension: Secondary | ICD-10-CM | POA: Diagnosis not present

## 2021-11-02 DIAGNOSIS — M1611 Unilateral primary osteoarthritis, right hip: Secondary | ICD-10-CM | POA: Diagnosis not present

## 2021-11-03 DIAGNOSIS — D649 Anemia, unspecified: Secondary | ICD-10-CM | POA: Diagnosis not present

## 2021-11-03 DIAGNOSIS — G4733 Obstructive sleep apnea (adult) (pediatric): Secondary | ICD-10-CM | POA: Diagnosis not present

## 2021-11-03 DIAGNOSIS — I10 Essential (primary) hypertension: Secondary | ICD-10-CM | POA: Diagnosis not present

## 2021-11-03 DIAGNOSIS — J9621 Acute and chronic respiratory failure with hypoxia: Secondary | ICD-10-CM | POA: Diagnosis not present

## 2021-11-03 DIAGNOSIS — I110 Hypertensive heart disease with heart failure: Secondary | ICD-10-CM | POA: Diagnosis not present

## 2021-11-03 DIAGNOSIS — E119 Type 2 diabetes mellitus without complications: Secondary | ICD-10-CM | POA: Diagnosis not present

## 2021-11-03 DIAGNOSIS — J9622 Acute and chronic respiratory failure with hypercapnia: Secondary | ICD-10-CM | POA: Diagnosis not present

## 2021-11-03 DIAGNOSIS — I5031 Acute diastolic (congestive) heart failure: Secondary | ICD-10-CM | POA: Diagnosis not present

## 2021-11-03 DIAGNOSIS — M1611 Unilateral primary osteoarthritis, right hip: Secondary | ICD-10-CM | POA: Diagnosis not present

## 2021-11-03 DIAGNOSIS — M109 Gout, unspecified: Secondary | ICD-10-CM | POA: Diagnosis not present

## 2021-11-04 DIAGNOSIS — G4733 Obstructive sleep apnea (adult) (pediatric): Secondary | ICD-10-CM | POA: Diagnosis not present

## 2021-11-04 DIAGNOSIS — J9622 Acute and chronic respiratory failure with hypercapnia: Secondary | ICD-10-CM | POA: Diagnosis not present

## 2021-11-04 DIAGNOSIS — I110 Hypertensive heart disease with heart failure: Secondary | ICD-10-CM | POA: Diagnosis not present

## 2021-11-04 DIAGNOSIS — I10 Essential (primary) hypertension: Secondary | ICD-10-CM | POA: Diagnosis not present

## 2021-11-04 DIAGNOSIS — E119 Type 2 diabetes mellitus without complications: Secondary | ICD-10-CM | POA: Diagnosis not present

## 2021-11-04 DIAGNOSIS — M1611 Unilateral primary osteoarthritis, right hip: Secondary | ICD-10-CM | POA: Diagnosis not present

## 2021-11-04 DIAGNOSIS — I5031 Acute diastolic (congestive) heart failure: Secondary | ICD-10-CM | POA: Diagnosis not present

## 2021-11-04 DIAGNOSIS — M109 Gout, unspecified: Secondary | ICD-10-CM | POA: Diagnosis not present

## 2021-11-04 DIAGNOSIS — D649 Anemia, unspecified: Secondary | ICD-10-CM | POA: Diagnosis not present

## 2021-11-04 DIAGNOSIS — J9621 Acute and chronic respiratory failure with hypoxia: Secondary | ICD-10-CM | POA: Diagnosis not present

## 2021-11-05 DIAGNOSIS — I10 Essential (primary) hypertension: Secondary | ICD-10-CM | POA: Diagnosis not present

## 2021-11-06 DIAGNOSIS — F419 Anxiety disorder, unspecified: Secondary | ICD-10-CM | POA: Diagnosis not present

## 2021-11-06 DIAGNOSIS — F330 Major depressive disorder, recurrent, mild: Secondary | ICD-10-CM | POA: Diagnosis not present

## 2021-11-07 DIAGNOSIS — I10 Essential (primary) hypertension: Secondary | ICD-10-CM | POA: Diagnosis not present

## 2021-11-08 DIAGNOSIS — I10 Essential (primary) hypertension: Secondary | ICD-10-CM | POA: Diagnosis not present

## 2021-11-09 DIAGNOSIS — D649 Anemia, unspecified: Secondary | ICD-10-CM | POA: Diagnosis not present

## 2021-11-09 DIAGNOSIS — M1611 Unilateral primary osteoarthritis, right hip: Secondary | ICD-10-CM | POA: Diagnosis not present

## 2021-11-09 DIAGNOSIS — F419 Anxiety disorder, unspecified: Secondary | ICD-10-CM | POA: Diagnosis not present

## 2021-11-09 DIAGNOSIS — G4733 Obstructive sleep apnea (adult) (pediatric): Secondary | ICD-10-CM | POA: Diagnosis not present

## 2021-11-09 DIAGNOSIS — M109 Gout, unspecified: Secondary | ICD-10-CM | POA: Diagnosis not present

## 2021-11-09 DIAGNOSIS — F330 Major depressive disorder, recurrent, mild: Secondary | ICD-10-CM | POA: Diagnosis not present

## 2021-11-09 DIAGNOSIS — J9622 Acute and chronic respiratory failure with hypercapnia: Secondary | ICD-10-CM | POA: Diagnosis not present

## 2021-11-09 DIAGNOSIS — I5031 Acute diastolic (congestive) heart failure: Secondary | ICD-10-CM | POA: Diagnosis not present

## 2021-11-09 DIAGNOSIS — I110 Hypertensive heart disease with heart failure: Secondary | ICD-10-CM | POA: Diagnosis not present

## 2021-11-09 DIAGNOSIS — E119 Type 2 diabetes mellitus without complications: Secondary | ICD-10-CM | POA: Diagnosis not present

## 2021-11-09 DIAGNOSIS — J9621 Acute and chronic respiratory failure with hypoxia: Secondary | ICD-10-CM | POA: Diagnosis not present

## 2021-11-09 DIAGNOSIS — I10 Essential (primary) hypertension: Secondary | ICD-10-CM | POA: Diagnosis not present

## 2021-11-10 DIAGNOSIS — E119 Type 2 diabetes mellitus without complications: Secondary | ICD-10-CM | POA: Diagnosis not present

## 2021-11-10 DIAGNOSIS — J9621 Acute and chronic respiratory failure with hypoxia: Secondary | ICD-10-CM | POA: Diagnosis not present

## 2021-11-10 DIAGNOSIS — J9622 Acute and chronic respiratory failure with hypercapnia: Secondary | ICD-10-CM | POA: Diagnosis not present

## 2021-11-10 DIAGNOSIS — I5031 Acute diastolic (congestive) heart failure: Secondary | ICD-10-CM | POA: Diagnosis not present

## 2021-11-10 DIAGNOSIS — M109 Gout, unspecified: Secondary | ICD-10-CM | POA: Diagnosis not present

## 2021-11-10 DIAGNOSIS — M1611 Unilateral primary osteoarthritis, right hip: Secondary | ICD-10-CM | POA: Diagnosis not present

## 2021-11-10 DIAGNOSIS — D649 Anemia, unspecified: Secondary | ICD-10-CM | POA: Diagnosis not present

## 2021-11-10 DIAGNOSIS — I10 Essential (primary) hypertension: Secondary | ICD-10-CM | POA: Diagnosis not present

## 2021-11-10 DIAGNOSIS — I110 Hypertensive heart disease with heart failure: Secondary | ICD-10-CM | POA: Diagnosis not present

## 2021-11-10 DIAGNOSIS — G4733 Obstructive sleep apnea (adult) (pediatric): Secondary | ICD-10-CM | POA: Diagnosis not present

## 2021-11-11 DIAGNOSIS — J9622 Acute and chronic respiratory failure with hypercapnia: Secondary | ICD-10-CM | POA: Diagnosis not present

## 2021-11-11 DIAGNOSIS — I10 Essential (primary) hypertension: Secondary | ICD-10-CM | POA: Diagnosis not present

## 2021-11-11 DIAGNOSIS — M1611 Unilateral primary osteoarthritis, right hip: Secondary | ICD-10-CM | POA: Diagnosis not present

## 2021-11-11 DIAGNOSIS — J9621 Acute and chronic respiratory failure with hypoxia: Secondary | ICD-10-CM | POA: Diagnosis not present

## 2021-11-11 DIAGNOSIS — I5031 Acute diastolic (congestive) heart failure: Secondary | ICD-10-CM | POA: Diagnosis not present

## 2021-11-11 DIAGNOSIS — E119 Type 2 diabetes mellitus without complications: Secondary | ICD-10-CM | POA: Diagnosis not present

## 2021-11-11 DIAGNOSIS — M109 Gout, unspecified: Secondary | ICD-10-CM | POA: Diagnosis not present

## 2021-11-11 DIAGNOSIS — G4733 Obstructive sleep apnea (adult) (pediatric): Secondary | ICD-10-CM | POA: Diagnosis not present

## 2021-11-11 DIAGNOSIS — I110 Hypertensive heart disease with heart failure: Secondary | ICD-10-CM | POA: Diagnosis not present

## 2021-11-11 DIAGNOSIS — D649 Anemia, unspecified: Secondary | ICD-10-CM | POA: Diagnosis not present

## 2021-11-12 DIAGNOSIS — I10 Essential (primary) hypertension: Secondary | ICD-10-CM | POA: Diagnosis not present

## 2021-11-13 DIAGNOSIS — I10 Essential (primary) hypertension: Secondary | ICD-10-CM | POA: Diagnosis not present

## 2021-11-14 DIAGNOSIS — I10 Essential (primary) hypertension: Secondary | ICD-10-CM | POA: Diagnosis not present

## 2021-11-15 DIAGNOSIS — F330 Major depressive disorder, recurrent, mild: Secondary | ICD-10-CM | POA: Diagnosis not present

## 2021-11-15 DIAGNOSIS — I10 Essential (primary) hypertension: Secondary | ICD-10-CM | POA: Diagnosis not present

## 2021-11-15 DIAGNOSIS — F419 Anxiety disorder, unspecified: Secondary | ICD-10-CM | POA: Diagnosis not present

## 2021-11-16 ENCOUNTER — Ambulatory Visit (INDEPENDENT_AMBULATORY_CARE_PROVIDER_SITE_OTHER): Payer: Medicare HMO

## 2021-11-16 DIAGNOSIS — D649 Anemia, unspecified: Secondary | ICD-10-CM | POA: Diagnosis not present

## 2021-11-16 DIAGNOSIS — I10 Essential (primary) hypertension: Secondary | ICD-10-CM | POA: Diagnosis not present

## 2021-11-16 DIAGNOSIS — I110 Hypertensive heart disease with heart failure: Secondary | ICD-10-CM | POA: Diagnosis not present

## 2021-11-16 DIAGNOSIS — M1611 Unilateral primary osteoarthritis, right hip: Secondary | ICD-10-CM | POA: Diagnosis not present

## 2021-11-16 DIAGNOSIS — I5032 Chronic diastolic (congestive) heart failure: Secondary | ICD-10-CM | POA: Diagnosis not present

## 2021-11-16 DIAGNOSIS — G4733 Obstructive sleep apnea (adult) (pediatric): Secondary | ICD-10-CM | POA: Diagnosis not present

## 2021-11-16 DIAGNOSIS — J9621 Acute and chronic respiratory failure with hypoxia: Secondary | ICD-10-CM | POA: Diagnosis not present

## 2021-11-16 DIAGNOSIS — I5031 Acute diastolic (congestive) heart failure: Secondary | ICD-10-CM | POA: Diagnosis not present

## 2021-11-16 DIAGNOSIS — M109 Gout, unspecified: Secondary | ICD-10-CM | POA: Diagnosis not present

## 2021-11-16 DIAGNOSIS — E119 Type 2 diabetes mellitus without complications: Secondary | ICD-10-CM | POA: Diagnosis not present

## 2021-11-16 DIAGNOSIS — J9622 Acute and chronic respiratory failure with hypercapnia: Secondary | ICD-10-CM | POA: Diagnosis not present

## 2021-11-16 NOTE — Patient Instructions (Signed)
Visit Information  Thank you for taking time to visit with me today. Please don't hesitate to contact me if I can be of assistance to you before our next scheduled telephone appointment.  Following are the goals we discussed today:  Patient will self administer medications as prescribed as evidenced by self report/primary caregiver report  Patient will attend all scheduled provider appointments as evidenced by clinician review of documented attendance to scheduled appointments and patient/caregiver report Patient will call pharmacy for medication refills as evidenced by patient report and review of pharmacy fill history as appropriate Patient will call provider office for new concerns or questions as evidenced by review of documented incoming telephone call notes and patient report watch for swelling in feet, ankles and legs every day - call doctor for signs and symptoms of high blood pressure - keep all doctor appointments Follow diet plan provided to you by the nutritionist Follow a low salt diet as advised by your provider.  Continue to perform exercises provided to you by home physical therapist / occupational therapist. Apply hot or cold pack for pain relief to affected area  Our next appointment is by telephone on January 03, 2022 at 1:00 pm  Please call the care guide team at [phone] if you need to cancel or reschedule your appointment.   If you are experiencing a Mental Health or Behavioral Health Crisis or need someone to talk to, please call the Suicide and Crisis Lifeline: 988 call 1-800-273-TALK (toll free, 24 hour hotline)   Patient verbalizes understanding of instructions and care plan provided today and agrees to view in MyChart. Active MyChart status confirmed with patient.    George Ina RN,BSN,CCM RN Case Manager [facility] [place]  [phone]

## 2021-11-16 NOTE — Chronic Care Management (AMB) (Addendum)
Chronic Care Management   CCM RN Visit Note  11/16/2021 Name: Michael Payne MRN: [id number] DOB: 11-16-1970  Subjective: Michael Payne is a 51 y.o. year old male who is a primary care patient of [name], MD. The care management team was consulted for assistance with disease management and care coordination needs.    Engaged with patient by telephone for follow up visit in response to provider referral for case management and/or care coordination services.   Consent to Services:  The patient was given information about Chronic Care Management services, agreed to services, and gave verbal consent prior to initiation of services.  Please see initial visit note for detailed documentation.   Patient agreed to services and verbal consent obtained.   Assessment: Review of patient past medical history, allergies, medications, health status, including review of consultants reports, laboratory and other test data, was performed as part of comprehensive evaluation and provision of chronic care management services.   SDOH (Social Determinants of Health) assessments and interventions performed:    CCM Care Plan  Allergies  Allergen Reactions   Aspirin Other (See Comments)    Upset stomach   Lactose Intolerance (Gi) Nausea And Vomiting   Lisinopril     Causes cough    Outpatient Encounter Medications as of 11/16/2021  Medication Sig   acetaminophen (TYLENOL) 325 MG tablet Take 2 tablets (650 mg total) by mouth every 6 (six) hours as needed for headache or mild pain.   allopurinol (ZYLOPRIM) 100 MG tablet TAKE 1 TABLET BY MOUTH EVERY DAY   amLODipine (NORVASC) 10 MG tablet Take 1 tablet (10 mg total) by mouth daily.   carvedilol (COREG) 3.125 MG tablet TAKE 1 TABLET BY MOUTH TWICE A DAY WITH A MEAL   furosemide (LASIX) 40 MG tablet TAKE 1 TABLET BY MOUTH EVERY DAY   KLOR-CON M10 10 MEQ tablet TAKE 1 TABLET BY MOUTH EVERY DAY   Multiple Vitamins-Minerals (MULTIVITAMIN WITH MINERALS)  tablet Take 1 tablet by mouth daily.   oxyCODONE-acetaminophen (PERCOCET) 10-325 MG tablet Take 1 tablet by mouth every 6 (six) hours as needed for pain.   vitamin C (ASCORBIC ACID) 500 MG tablet Take 500 mg by mouth daily.   Colchicine (MITIGARE) 0.6 MG CAPS Take 0.5 tablets by mouth daily as needed (for gout.  okay to fill with mitigare). (Patient not taking: Reported on 11/16/2021)   diclofenac Sodium (VOLTAREN) 1 % GEL Apply 4 g topically 4 (four) times daily. (Patient not taking: Reported on 11/16/2021)   naloxone [facility]) nasal spray 4 mg/0.1 mL Spray nostril if needed for opioid overuse- decreased consciousness, decreased respirations   TRULICITY 3 GH/8.2XH SOPN INJECT 3 MG AS DIRECTED ONCE A WEEK. (Patient not taking: Reported on 11/16/2021)   No facility-administered encounter medications on file as of 11/16/2021.    Patient Active Problem List   Diagnosis Date Noted   Nonobstructive transaminitis 06/28/2021   Decubitus ulcer of posterior thigh, stage 2 ([place]) 06/28/2021   Acute kidney injury (resolved) 06/28/2021   Pre-diabetes 06/10/2021   Obesity, Class III, BMI 40-49.9 (morbid obesity) ([place]) 06/10/2021   Osteoarthritis 06/10/2021   Pressure injury of skin 06/09/2021   Respiratory failure ([place]) 37/16/9678   Acute diastolic CHF (congestive heart failure) ([place]) 05/31/2021   CHF (congestive heart failure) ([place]) 05/31/2021   Hypoxia 05/31/2021   Gout, unspecified 03/10/2021   Edema 03/10/2021   Diabetes mellitus without complication ([place]) 93/81/0175   Muscle spasms of both lower extremities 07/29/2020   Bedbound 05/22/2020  Unilateral osteoarthritis of hip, right 12/27/2019   Severe obstructive sleep apnea 02/22/2018   Hypertriglyceridemia 08/24/2017   Low HDL (under 40) 17/61/6073   Metabolic syndrome 71/02/2693   Morbid obesity ([place]) 10/14/2013   HTN (hypertension) 10/14/2013    Conditions to be addressed/monitored:CHF, HTN, Osteoarthritis, and Obesity  Care Plan : [facility]  plan of care  Updates made by Dannielle Karvonen, RN since 11/16/2021 12:00 AM     Problem: Chronic disease management, education  and/ or care coordination needs   Priority: High     Long-Range Goal: Development of plan of care to address chronic disease management and/ or care coodination needs   Start Date: 08/06/2021  Expected End Date: 02/16/2022  Priority: High  Note:   Current Barriers:  Knowledge Deficits related to plan of care for management of HTN and osteoarthritis / morbid obesity, heart failure  Chronic Disease Management support and education needs related to HTN and osteoarthritis / morbid obesity, heart failure Transportation barriers Mobility limitations due to: Osteoarthritis of hip/ chronic pain/ morbid obesity.  Patient is homebound and has not walked in 2 years.  Patient states," I am doing great."  Patient is good spirits.  He states he just finished working with his physical therapist for the day.  He reports having physical therapy 2x per week and occupational therapy 1x per week. Patient states he is able to sit on the side of the bed for approximately 30-45 min and stand up for approximately 1 min.  He states he still has good and bad days with knee / hip and shoulder pain.   He reports ongoing weekly follow up visits with his therapist. He states he feels the therapy sessions are really helping him especially as he is going through marriage separation.  Patient states his dog provides a lot of emotional support for him.  He states he is concerned that his landlord does not allow dogs. Patient states his has tried to contact his doctor and ask his counselor if a medical letter of necessity can be written to the landlord stating he needs to have the dog for emotional support.  He states he has been unsuccessful at reaching both.    Patient states he continues to see the nutritionist. He states he has definitely lost a lot of weight. He states he is not sure of his specific  weight but states he is able to tell by his clothes and the way he looks. Patient reports his blood pressure ranges from 136-143/80-90's.  Patient reports being in the [place]ough[place] zone of his heart failure action plan. He reports he continues to use his Bipap nightly and gets anywhere from 8-9 hours of sleep at night.    RNCM Clinical Goal(s):  Patient will verbalize understanding of plan for management of HTN and osteoarthritis, morbid obesity, heart failure take all medications exactly as prescribed and will call provider for medication related questions attend all scheduled medical appointments:  continue to work with RN Care Manager to address care management and care coordination needs related to  HTN and osteoarthritis, morbid obesity, heart failure work with community resource care guide to address needs related to  Transportation through collaboration with Consulting civil engineer, Education officer, museum, provider, and care team.   Interventions: 1:1 collaboration with primary care provider regarding development and update of comprehensive plan of care as evidenced by provider attestation and co-signature Inter-disciplinary care team collaboration (see longitudinal plan of care) Evaluation of current treatment plan related to  self  management and patient's adherence to plan as established by provider  Osteoarthritis /Pain Interventions: Goal on track: Yes  Longterm goal Pain assessment performed (pain level today per patient is 7-8) Medications reviewed and compliance discussed  Reviewed provider established plan for pain management Discussed importance of adherence to all scheduled medical appointments; Counseled on the importance of reporting any/all new or changed pain symptoms or management strategies to pain management provider; Discussed use of relaxation techniques and/or diversional activities to assist with pain reduction (distraction, imagery, relaxation, massage, acupressure, TENS, heat, and cold  application; Advised to continue to work with home health PT/ OT and perform home exercises as advised.  Hypertension Interventions:  Goal on track: Yes Long term goal Last practice recorded BP readings:  BP Readings from Last 3 Encounters:  08/04/21 135/78  07/26/21 (!) 148/95  06/28/21 (!) 145/80  Most recent eGFR/CrCl:  Lab Results  Component Value Date   EGFR 117 07/26/2021    No components found for: CRCL  Evaluation of current treatment plan related to hypertension self management and patient's adherence to plan as established by provider; Reviewed medications with patient and discussed importance of compliance; Discussed plans with patient for ongoing care management follow up and provided patient with direct contact information for care management team; Reviewed scheduled/upcoming provider appointments RNCM sent message to primary care provider for patient regarding return call to discuss dog support/ medical necessity letter.  Patient advised to talk with his counselor regarding dog support/ medical necessity letter.   Obesity interventions: New goal. Evaluation of current treatment plan related to  obesity ,  self-management and patient's adherence to plan as established by provider. Discussed plans with patient for ongoing care management follow up and provided patient with direct contact information for care management team Reviewed medications with patient and discussed compliance Reviewed scheduled/upcoming provider appointments  Advised patient to continue follow up with nutritionist and follow prescribed diet plan Advised patient to continue daily exercises routine  Heart Failure Interventions: Goal on track: Yes Patient advised to continue following low sodium diet; Reviewed Heart Failure Action Plan  Discussed the importance of keeping all appointments with provider; Advised to continue to use CPAP/ bipap as advised by doctor Advised to take medications as  prescribed.  Discussed plans with patient for ongoing care management follow up and provided patient with direct contact information for care management team Reviewed medications with patient and discussed compliance  Patient Goals/Self-Care Activities: Patient will self administer medications as prescribed as evidenced by self report/primary caregiver report  Patient will attend all scheduled provider appointments as evidenced by clinician review of documented attendance to scheduled appointments and patient/caregiver report Patient will call pharmacy for medication refills as evidenced by patient report and review of pharmacy fill history as appropriate Patient will call provider office for new concerns or questions as evidenced by review of documented incoming telephone call notes and patient report watch for swelling in feet, ankles and legs every day - call doctor for signs and symptoms of high blood pressure - keep all doctor appointments Follow diet plan provided to you by the nutritionist Follow a low salt diet as advised by your provider.  Continue to perform exercises provided to you by home physical therapist / occupational therapist. Apply hot or cold pack for pain relief to affected area          Plan:The patient has been provided with contact information for the care management team and has been advised to call with any health  related questions or concerns.  The care management team will reach out to the patient again over the next 2 months . Quinn Plowman RN,BSN,CCM RN Case Manager [place]  [phone]

## 2021-11-17 ENCOUNTER — Encounter: Payer: Self-pay | Admitting: Family Medicine

## 2021-11-17 DIAGNOSIS — J9621 Acute and chronic respiratory failure with hypoxia: Secondary | ICD-10-CM | POA: Diagnosis not present

## 2021-11-17 DIAGNOSIS — M109 Gout, unspecified: Secondary | ICD-10-CM | POA: Diagnosis not present

## 2021-11-17 DIAGNOSIS — E119 Type 2 diabetes mellitus without complications: Secondary | ICD-10-CM | POA: Diagnosis not present

## 2021-11-17 DIAGNOSIS — I110 Hypertensive heart disease with heart failure: Secondary | ICD-10-CM | POA: Diagnosis not present

## 2021-11-17 DIAGNOSIS — G4733 Obstructive sleep apnea (adult) (pediatric): Secondary | ICD-10-CM | POA: Diagnosis not present

## 2021-11-17 DIAGNOSIS — D649 Anemia, unspecified: Secondary | ICD-10-CM | POA: Diagnosis not present

## 2021-11-17 DIAGNOSIS — J9622 Acute and chronic respiratory failure with hypercapnia: Secondary | ICD-10-CM | POA: Diagnosis not present

## 2021-11-17 DIAGNOSIS — M1611 Unilateral primary osteoarthritis, right hip: Secondary | ICD-10-CM | POA: Diagnosis not present

## 2021-11-17 DIAGNOSIS — R32 Unspecified urinary incontinence: Secondary | ICD-10-CM | POA: Diagnosis not present

## 2021-11-17 DIAGNOSIS — I10 Essential (primary) hypertension: Secondary | ICD-10-CM | POA: Diagnosis not present

## 2021-11-17 DIAGNOSIS — I5031 Acute diastolic (congestive) heart failure: Secondary | ICD-10-CM | POA: Diagnosis not present

## 2021-11-18 DIAGNOSIS — E119 Type 2 diabetes mellitus without complications: Secondary | ICD-10-CM | POA: Diagnosis not present

## 2021-11-18 DIAGNOSIS — J9621 Acute and chronic respiratory failure with hypoxia: Secondary | ICD-10-CM | POA: Diagnosis not present

## 2021-11-18 DIAGNOSIS — G4733 Obstructive sleep apnea (adult) (pediatric): Secondary | ICD-10-CM | POA: Diagnosis not present

## 2021-11-18 DIAGNOSIS — I5031 Acute diastolic (congestive) heart failure: Secondary | ICD-10-CM | POA: Diagnosis not present

## 2021-11-18 DIAGNOSIS — I10 Essential (primary) hypertension: Secondary | ICD-10-CM | POA: Diagnosis not present

## 2021-11-18 DIAGNOSIS — M1611 Unilateral primary osteoarthritis, right hip: Secondary | ICD-10-CM | POA: Diagnosis not present

## 2021-11-18 DIAGNOSIS — J9622 Acute and chronic respiratory failure with hypercapnia: Secondary | ICD-10-CM | POA: Diagnosis not present

## 2021-11-18 DIAGNOSIS — I110 Hypertensive heart disease with heart failure: Secondary | ICD-10-CM | POA: Diagnosis not present

## 2021-11-18 DIAGNOSIS — M109 Gout, unspecified: Secondary | ICD-10-CM | POA: Diagnosis not present

## 2021-11-18 DIAGNOSIS — D649 Anemia, unspecified: Secondary | ICD-10-CM | POA: Diagnosis not present

## 2021-11-20 DIAGNOSIS — I10 Essential (primary) hypertension: Secondary | ICD-10-CM | POA: Diagnosis not present

## 2021-11-21 DIAGNOSIS — I10 Essential (primary) hypertension: Secondary | ICD-10-CM | POA: Diagnosis not present

## 2021-11-22 ENCOUNTER — Encounter: Payer: Medicare HMO | Attending: Surgery | Admitting: Skilled Nursing Facility1

## 2021-11-22 DIAGNOSIS — E119 Type 2 diabetes mellitus without complications: Secondary | ICD-10-CM | POA: Diagnosis not present

## 2021-11-22 DIAGNOSIS — I10 Essential (primary) hypertension: Secondary | ICD-10-CM | POA: Diagnosis not present

## 2021-11-22 LAB — HM DIABETES EYE EXAM

## 2021-11-22 NOTE — Progress Notes (Signed)
Supervised Weight Loss Visit ?Bariatric Nutrition Education ? ?Sleeve gastrectomy  ? ?3 out of 6 SWL Appointments  ? ? ?NUTRITION ASSESSMENT ? ?Anthropometrics  ?Start weight at [facility]: Pt is bedbound ?Weight: pt is bedbound ? ?Clinical  ?Medical hx: Chf, Respiratory Failure, Diabetes, osteoarthritis, OSA ?Medications: allopurinol, Trulicity, furosemide, oxycodone, amlodpine, potassium-chloride, tylenol, vitamin c, multi  ?Labs: A1C 6.5 ?Notable signs/symptoms:  ?Any previous deficiencies? No ? ?Lifestyle & Dietary Hx ? ?Pts daughter makes his breakfast and dinner and is very supportive: angelina  ? ?(Previous) Pt states he has not felt this good in a long time and PT is going really well. Pt states he does not eat snacks because animal protein is so filling.  ?Pt is doing extremely well and states he does not even eat half the portions he used to stating he can feel his stomach having been shrunk.  ? ?Pt states he can stand now! Pt states he wants to be able to go places and do things and play at the park and go fishing. Pt states he wants to get in shape and run and play.  ?Pt states he gets meal from [facility].  ?Pt state she has an 50 year old and a 50 year old.  ?Pt states chips and fries are what he really loves but so tries to avoid them.  ?Pt states he has been training himself to chew very well and take his time with his foods and does not drink until after he eats.  ? ?Estimated daily fluid intake: 125 oz ?Supplements:  ?Current average weekly physical activity: bedbound/PT ? ?24-Hr Dietary Recall: 3 times a day fruit; sometimes snacks in between  ?First Meal: oatmeal + 2 [place] sausage and fruit or grits and eggs ?Snack: carrots and celery with peanut butter ?Second Meal: salad + chicken + tomato + onion + craisins + dressing or cauliflower crust pizza  + veggies or fish and veggies ?Snack: fruit or vegan fig bar ?Third Meal: ribs + squash + cabbage + onion or veggies + meatballs ?Snack: no sugar added pudding  sugar free brownie with sugar free whipped cream ?Beverages: water, hot tea + lemon + ginsing + honey + probiotics  ? ?Estimated Energy Needs ?Calories: 2200 ? ? ?NUTRITION DIAGNOSIS  ?Overweight/obesity ([place]-3.3) related to past poor dietary habits and physical inactivity as evidenced by patient w/ planned sleeve gastrectomy surgery following dietary guidelines for continued weight loss. ? ? ?NUTRITION INTERVENTION  ?Nutrition counseling (C-1) and education (E-2) to facilitate bariatric surgery goals. ? ?Pre-Op Goals Progress & New Goals ?Great up the great work! ?Continue: Be sitting up for our next appt ?NEW: drink on the fluid throughout the day instead of chugging at once ? ?Handouts Provided Include  ? ? ?Learning Style & Readiness for Change ?Teaching method utilized: Visual & Auditory  ?Demonstrated degree of understanding via: Teach Back  ?Readiness Level: action ?Barriers to learning/adherence to lifestyle change: none; his daughter is providing his meals which is following a healthy diet ? ?RD's Notes for next Visit  ?Assess pts adherence to chosen goals ? ? ?MONITORING & EVALUATION ?Dietary intake, weekly physical activity, body weight, and pre-op goals in 1 month.  ? ?Next Steps  ?Patient is to return to [facility] in 1 month ?

## 2021-11-23 ENCOUNTER — Other Ambulatory Visit: Payer: Self-pay

## 2021-11-23 DIAGNOSIS — I110 Hypertensive heart disease with heart failure: Secondary | ICD-10-CM | POA: Diagnosis not present

## 2021-11-23 DIAGNOSIS — J9610 Chronic respiratory failure, unspecified whether with hypoxia or hypercapnia: Secondary | ICD-10-CM | POA: Diagnosis not present

## 2021-11-23 DIAGNOSIS — I5031 Acute diastolic (congestive) heart failure: Secondary | ICD-10-CM | POA: Diagnosis not present

## 2021-11-23 DIAGNOSIS — G4733 Obstructive sleep apnea (adult) (pediatric): Secondary | ICD-10-CM | POA: Diagnosis not present

## 2021-11-23 DIAGNOSIS — D649 Anemia, unspecified: Secondary | ICD-10-CM | POA: Diagnosis not present

## 2021-11-23 DIAGNOSIS — M1611 Unilateral primary osteoarthritis, right hip: Secondary | ICD-10-CM

## 2021-11-23 DIAGNOSIS — J9621 Acute and chronic respiratory failure with hypoxia: Secondary | ICD-10-CM | POA: Diagnosis not present

## 2021-11-23 DIAGNOSIS — J9622 Acute and chronic respiratory failure with hypercapnia: Secondary | ICD-10-CM | POA: Diagnosis not present

## 2021-11-23 DIAGNOSIS — M109 Gout, unspecified: Secondary | ICD-10-CM | POA: Diagnosis not present

## 2021-11-23 DIAGNOSIS — E119 Type 2 diabetes mellitus without complications: Secondary | ICD-10-CM | POA: Diagnosis not present

## 2021-11-23 NOTE — Telephone Encounter (Signed)
LOV - 03/09/21 ?NOV - not scheduled ?Refilled - 10-29-21 #120/0 [facility] [address] ?

## 2021-11-24 DIAGNOSIS — I110 Hypertensive heart disease with heart failure: Secondary | ICD-10-CM | POA: Diagnosis not present

## 2021-11-24 DIAGNOSIS — I5031 Acute diastolic (congestive) heart failure: Secondary | ICD-10-CM | POA: Diagnosis not present

## 2021-11-24 DIAGNOSIS — J9621 Acute and chronic respiratory failure with hypoxia: Secondary | ICD-10-CM | POA: Diagnosis not present

## 2021-11-24 DIAGNOSIS — D649 Anemia, unspecified: Secondary | ICD-10-CM | POA: Diagnosis not present

## 2021-11-24 DIAGNOSIS — E119 Type 2 diabetes mellitus without complications: Secondary | ICD-10-CM | POA: Diagnosis not present

## 2021-11-24 DIAGNOSIS — G4733 Obstructive sleep apnea (adult) (pediatric): Secondary | ICD-10-CM | POA: Diagnosis not present

## 2021-11-24 DIAGNOSIS — M109 Gout, unspecified: Secondary | ICD-10-CM | POA: Diagnosis not present

## 2021-11-24 DIAGNOSIS — J9622 Acute and chronic respiratory failure with hypercapnia: Secondary | ICD-10-CM | POA: Diagnosis not present

## 2021-11-24 DIAGNOSIS — M1611 Unilateral primary osteoarthritis, right hip: Secondary | ICD-10-CM | POA: Diagnosis not present

## 2021-11-24 MED ORDER — OXYCODONE-ACETAMINOPHEN 10-325 MG PO TABS: 1.0000 | ORAL_TABLET | Freq: Four times a day (QID) | ORAL | 0 refills | Status: DC | PRN

## 2021-11-24 NOTE — Telephone Encounter (Signed)
When can patient have a video visit with me?  Please see about seeing that up.  Rx sent. Thanks.  ?

## 2021-11-25 DIAGNOSIS — D649 Anemia, unspecified: Secondary | ICD-10-CM | POA: Diagnosis not present

## 2021-11-25 DIAGNOSIS — I110 Hypertensive heart disease with heart failure: Secondary | ICD-10-CM | POA: Diagnosis not present

## 2021-11-25 DIAGNOSIS — F419 Anxiety disorder, unspecified: Secondary | ICD-10-CM | POA: Diagnosis not present

## 2021-11-25 DIAGNOSIS — I5031 Acute diastolic (congestive) heart failure: Secondary | ICD-10-CM | POA: Diagnosis not present

## 2021-11-25 DIAGNOSIS — M1611 Unilateral primary osteoarthritis, right hip: Secondary | ICD-10-CM | POA: Diagnosis not present

## 2021-11-25 DIAGNOSIS — G4733 Obstructive sleep apnea (adult) (pediatric): Secondary | ICD-10-CM | POA: Diagnosis not present

## 2021-11-25 DIAGNOSIS — J9621 Acute and chronic respiratory failure with hypoxia: Secondary | ICD-10-CM | POA: Diagnosis not present

## 2021-11-25 DIAGNOSIS — J9622 Acute and chronic respiratory failure with hypercapnia: Secondary | ICD-10-CM | POA: Diagnosis not present

## 2021-11-25 DIAGNOSIS — F330 Major depressive disorder, recurrent, mild: Secondary | ICD-10-CM | POA: Diagnosis not present

## 2021-11-25 DIAGNOSIS — E119 Type 2 diabetes mellitus without complications: Secondary | ICD-10-CM | POA: Diagnosis not present

## 2021-11-25 DIAGNOSIS — M109 Gout, unspecified: Secondary | ICD-10-CM | POA: Diagnosis not present

## 2021-11-25 MED ORDER — OXYCODONE-ACETAMINOPHEN 10-325 MG PO TABS: 1.0000 | ORAL_TABLET | Freq: Four times a day (QID) | ORAL | 0 refills | Status: DC | PRN

## 2021-11-25 NOTE — Addendum Note (Signed)
Addended by: Joaquim NamUNCAN, Shawnese Magner S on: 11/25/2021 02:06 PM ? ? Modules accepted: Orders ? ?

## 2021-11-25 NOTE — Telephone Encounter (Signed)
Patient has made a video visit for 3.17.23 at 3pm ? ?Oxycodone is not available at [facility], did find they have it at  ? [id number] - [place], [place] - [address] oxyCODONE-acetaminophen (PERCOCET) 10-325 MG tablet ? ?Needs script sent over ? ?Also, patient states he has not had his Trulicity in 3 weeks, No [facility] location has it, patient is asking about a lower dose, or what should he do? TRULICITY 3 MG/0.5ML SOPN  ?

## 2021-11-25 NOTE — Telephone Encounter (Signed)
Called patient and advised patient new rx was sent for the oxy to requested pharmacy. Also advised patient that we are still working on trulicity option.  ?

## 2021-11-25 NOTE — Telephone Encounter (Signed)
Rx sent sent for [facility]codone to [facility].  Please cancel prev rx for oxycodone.  I will see about options re: trulicity in the meantime.  Thanks.   ?

## 2021-11-25 NOTE — Telephone Encounter (Signed)
Called [facility] [address] and cancelled the oxycodone rx.  ?

## 2021-11-28 DIAGNOSIS — M109 Gout, unspecified: Secondary | ICD-10-CM | POA: Diagnosis not present

## 2021-11-28 DIAGNOSIS — G4733 Obstructive sleep apnea (adult) (pediatric): Secondary | ICD-10-CM | POA: Diagnosis not present

## 2021-11-28 DIAGNOSIS — I5031 Acute diastolic (congestive) heart failure: Secondary | ICD-10-CM | POA: Diagnosis not present

## 2021-11-28 DIAGNOSIS — E119 Type 2 diabetes mellitus without complications: Secondary | ICD-10-CM | POA: Diagnosis not present

## 2021-11-28 DIAGNOSIS — M1611 Unilateral primary osteoarthritis, right hip: Secondary | ICD-10-CM | POA: Diagnosis not present

## 2021-11-28 DIAGNOSIS — I110 Hypertensive heart disease with heart failure: Secondary | ICD-10-CM | POA: Diagnosis not present

## 2021-11-28 DIAGNOSIS — J9622 Acute and chronic respiratory failure with hypercapnia: Secondary | ICD-10-CM | POA: Diagnosis not present

## 2021-11-28 DIAGNOSIS — D649 Anemia, unspecified: Secondary | ICD-10-CM | POA: Diagnosis not present

## 2021-11-28 DIAGNOSIS — J9621 Acute and chronic respiratory failure with hypoxia: Secondary | ICD-10-CM | POA: Diagnosis not present

## 2021-11-28 MED ORDER — TRULICITY 1.5 MG/0.5ML ~~LOC~~ SOAJ: 3.0000 mg | SUBCUTANEOUS | 2 refills | Status: DC

## 2021-11-28 NOTE — Addendum Note (Signed)
Addended by: Joaquim Nam on: 11/28/2021 03:27 PM ? ? Modules accepted: Orders ? ?

## 2021-11-28 NOTE — Telephone Encounter (Signed)
I resent a prescription for a lower dose to his pharmacy to see if they can fill that.  I also routed this to pharmacy staff here in the clinic, for any possible extra help.  I thank all involved.  Please update patient and let me know if he can't get the lower dose filled at the pharmacy. ?

## 2021-11-29 ENCOUNTER — Telehealth: Payer: Self-pay

## 2021-11-29 NOTE — Chronic Care Management (AMB) (Signed)
Called several pharmacies for stock of Trulicity. [facility] [place] has 1 box. Prescription for Trulicity inject 3 mg. Once weekly called in for the patient.Also appointment with CCM moved out to July . ? ?[name], CPP notified ? ?Myrtis Maille, CCMA ?Health concierge  ?[phone]  ?

## 2021-11-29 NOTE — Telephone Encounter (Signed)
Found 1 box of Trulicity 3 mg at [facility] on [address], verified patient could pick it up there and called in Rx. ?

## 2021-11-29 NOTE — Telephone Encounter (Addendum)
Greatly appreciate your help.  ?Please update patient.  Thanks.  ?

## 2021-11-30 DIAGNOSIS — J9621 Acute and chronic respiratory failure with hypoxia: Secondary | ICD-10-CM | POA: Diagnosis not present

## 2021-11-30 DIAGNOSIS — I110 Hypertensive heart disease with heart failure: Secondary | ICD-10-CM | POA: Diagnosis not present

## 2021-11-30 DIAGNOSIS — I5031 Acute diastolic (congestive) heart failure: Secondary | ICD-10-CM | POA: Diagnosis not present

## 2021-11-30 DIAGNOSIS — M1611 Unilateral primary osteoarthritis, right hip: Secondary | ICD-10-CM | POA: Diagnosis not present

## 2021-11-30 DIAGNOSIS — M109 Gout, unspecified: Secondary | ICD-10-CM | POA: Diagnosis not present

## 2021-11-30 DIAGNOSIS — J9622 Acute and chronic respiratory failure with hypercapnia: Secondary | ICD-10-CM | POA: Diagnosis not present

## 2021-11-30 DIAGNOSIS — E119 Type 2 diabetes mellitus without complications: Secondary | ICD-10-CM | POA: Diagnosis not present

## 2021-11-30 DIAGNOSIS — D649 Anemia, unspecified: Secondary | ICD-10-CM | POA: Diagnosis not present

## 2021-11-30 DIAGNOSIS — G4733 Obstructive sleep apnea (adult) (pediatric): Secondary | ICD-10-CM | POA: Diagnosis not present

## 2021-12-01 DIAGNOSIS — E119 Type 2 diabetes mellitus without complications: Secondary | ICD-10-CM | POA: Diagnosis not present

## 2021-12-01 DIAGNOSIS — I110 Hypertensive heart disease with heart failure: Secondary | ICD-10-CM | POA: Diagnosis not present

## 2021-12-01 DIAGNOSIS — M109 Gout, unspecified: Secondary | ICD-10-CM | POA: Diagnosis not present

## 2021-12-01 DIAGNOSIS — G4733 Obstructive sleep apnea (adult) (pediatric): Secondary | ICD-10-CM | POA: Diagnosis not present

## 2021-12-01 DIAGNOSIS — D649 Anemia, unspecified: Secondary | ICD-10-CM | POA: Diagnosis not present

## 2021-12-01 DIAGNOSIS — J9621 Acute and chronic respiratory failure with hypoxia: Secondary | ICD-10-CM | POA: Diagnosis not present

## 2021-12-01 DIAGNOSIS — J9622 Acute and chronic respiratory failure with hypercapnia: Secondary | ICD-10-CM | POA: Diagnosis not present

## 2021-12-01 DIAGNOSIS — I5031 Acute diastolic (congestive) heart failure: Secondary | ICD-10-CM | POA: Diagnosis not present

## 2021-12-01 DIAGNOSIS — M1611 Unilateral primary osteoarthritis, right hip: Secondary | ICD-10-CM | POA: Diagnosis not present

## 2021-12-02 DIAGNOSIS — G4733 Obstructive sleep apnea (adult) (pediatric): Secondary | ICD-10-CM | POA: Diagnosis not present

## 2021-12-02 DIAGNOSIS — I110 Hypertensive heart disease with heart failure: Secondary | ICD-10-CM | POA: Diagnosis not present

## 2021-12-02 DIAGNOSIS — J9621 Acute and chronic respiratory failure with hypoxia: Secondary | ICD-10-CM | POA: Diagnosis not present

## 2021-12-02 DIAGNOSIS — E119 Type 2 diabetes mellitus without complications: Secondary | ICD-10-CM | POA: Diagnosis not present

## 2021-12-02 DIAGNOSIS — M109 Gout, unspecified: Secondary | ICD-10-CM | POA: Diagnosis not present

## 2021-12-02 DIAGNOSIS — F330 Major depressive disorder, recurrent, mild: Secondary | ICD-10-CM | POA: Diagnosis not present

## 2021-12-02 DIAGNOSIS — J9622 Acute and chronic respiratory failure with hypercapnia: Secondary | ICD-10-CM | POA: Diagnosis not present

## 2021-12-02 DIAGNOSIS — M1611 Unilateral primary osteoarthritis, right hip: Secondary | ICD-10-CM | POA: Diagnosis not present

## 2021-12-02 DIAGNOSIS — I5031 Acute diastolic (congestive) heart failure: Secondary | ICD-10-CM | POA: Diagnosis not present

## 2021-12-02 DIAGNOSIS — F419 Anxiety disorder, unspecified: Secondary | ICD-10-CM | POA: Diagnosis not present

## 2021-12-02 DIAGNOSIS — D649 Anemia, unspecified: Secondary | ICD-10-CM | POA: Diagnosis not present

## 2021-12-03 ENCOUNTER — Telehealth (INDEPENDENT_AMBULATORY_CARE_PROVIDER_SITE_OTHER): Payer: Medicare HMO | Admitting: Family Medicine

## 2021-12-03 ENCOUNTER — Encounter: Payer: Self-pay | Admitting: Family Medicine

## 2021-12-03 ENCOUNTER — Other Ambulatory Visit: Payer: Self-pay

## 2021-12-03 DIAGNOSIS — M1611 Unilateral primary osteoarthritis, right hip: Secondary | ICD-10-CM | POA: Diagnosis not present

## 2021-12-03 DIAGNOSIS — E119 Type 2 diabetes mellitus without complications: Secondary | ICD-10-CM | POA: Diagnosis not present

## 2021-12-03 MED ORDER — ALLOPURINOL 100 MG PO TABS: 100.0000 mg | ORAL_TABLET | ORAL | Status: DC

## 2021-12-03 NOTE — Progress Notes (Signed)
Virtual visit completed through WebEx or similar program ?Patient location: home  ?Provider location: Financial controller at [facility], office  ?Participants: Patient and me (unless stated otherwise below) ? ?Pandemic considerations d/w pt.  ? ?Limitations and rationale for visit method d/w patient.  Patient agreed to proceed.  ? ?CC: f/u.   ? ?HPI: ? ?Taking allopurinol.  1 tab every other day.  No recent flares.   ? ?Diabetes.  He had been off trulicity for about 1 month, due to availability not ADE on med. He is going to restart soon.  He has been working on diet.  "Tremendous changes in my diet."  He cut out friend foods and carbs.  No sodas. Drinking water.  He is doing exercises in addition to PT.   ? ?Living at home with 82 y/o daughter. Wife moved out.   ? ?D/w pt about pain level. More pain on rainy days, usually ~5/10, today 6-7/10.  Still on PT, with inc pain with that.  Some relief from oxycodone.  No ADE on med.  Not drowsy on med.  He is able to stand for 2 minutes at a time.  He took 2 steps recently.  He can turn over in bed now.  He can get dressed now.   ? ?He has 4 kids.   ? ?He has 3 months of nutrition counseling left.  He has plan for surgery if needed, unless he can't lose enough weight w/o the procedure.   ? ?He is going to check with insurance about getting extra counseling visits.  He'll update me as needed.  Counseling has helped.  Per patient he was approved for CAPS.   ? ?Daughter is working from home.   ? ?Meds and allergies reviewed.  ? ?ROS: Per HPI unless specifically indicated in ROS section  ? ?NAD ?Speech wnl ? ?A/P: ? ?Chronic pain.  He is working with PT.  Not sedated on oxycodone.  Would continue as is.  Prescription sent.  Recheck periodically. ? ?Diabetes.  He is working hard on his diet and his current weight is not known but it is likely decreasing.  I thanked him for his effort.  We will see if we can get labs set up at home. ?

## 2021-12-05 ENCOUNTER — Telehealth: Payer: Self-pay | Admitting: Family Medicine

## 2021-12-05 MED ORDER — OXYCODONE-ACETAMINOPHEN 10-325 MG PO TABS: 1.0000 | ORAL_TABLET | Freq: Four times a day (QID) | ORAL | 0 refills | Status: DC | PRN

## 2021-12-05 NOTE — Assessment & Plan Note (Signed)
Chronic pain.  He is working with PT.  Not sedated on oxycodone.  Would continue as is.  Prescription sent.  Recheck periodically. ?

## 2021-12-05 NOTE — Telephone Encounter (Signed)
Is there a way to get labs set up at home for this gentleman, since he is homebound?  Please let me know. ?

## 2021-12-05 NOTE — Assessment & Plan Note (Signed)
He is working hard on his diet and his current weight is not known but it is likely decreasing.  I thanked him for his effort.  We will see if we can get labs set up at home. ?

## 2021-12-06 DIAGNOSIS — F419 Anxiety disorder, unspecified: Secondary | ICD-10-CM | POA: Diagnosis not present

## 2021-12-06 DIAGNOSIS — F330 Major depressive disorder, recurrent, mild: Secondary | ICD-10-CM | POA: Diagnosis not present

## 2021-12-07 DIAGNOSIS — E119 Type 2 diabetes mellitus without complications: Secondary | ICD-10-CM | POA: Diagnosis not present

## 2021-12-07 DIAGNOSIS — I110 Hypertensive heart disease with heart failure: Secondary | ICD-10-CM | POA: Diagnosis not present

## 2021-12-07 DIAGNOSIS — I5031 Acute diastolic (congestive) heart failure: Secondary | ICD-10-CM | POA: Diagnosis not present

## 2021-12-07 DIAGNOSIS — D649 Anemia, unspecified: Secondary | ICD-10-CM | POA: Diagnosis not present

## 2021-12-07 DIAGNOSIS — M1611 Unilateral primary osteoarthritis, right hip: Secondary | ICD-10-CM | POA: Diagnosis not present

## 2021-12-07 DIAGNOSIS — M109 Gout, unspecified: Secondary | ICD-10-CM | POA: Diagnosis not present

## 2021-12-07 DIAGNOSIS — G4733 Obstructive sleep apnea (adult) (pediatric): Secondary | ICD-10-CM | POA: Diagnosis not present

## 2021-12-07 DIAGNOSIS — J9622 Acute and chronic respiratory failure with hypercapnia: Secondary | ICD-10-CM | POA: Diagnosis not present

## 2021-12-07 DIAGNOSIS — J9621 Acute and chronic respiratory failure with hypoxia: Secondary | ICD-10-CM | POA: Diagnosis not present

## 2021-12-08 DIAGNOSIS — J9621 Acute and chronic respiratory failure with hypoxia: Secondary | ICD-10-CM | POA: Diagnosis not present

## 2021-12-08 DIAGNOSIS — I5031 Acute diastolic (congestive) heart failure: Secondary | ICD-10-CM | POA: Diagnosis not present

## 2021-12-08 DIAGNOSIS — E119 Type 2 diabetes mellitus without complications: Secondary | ICD-10-CM | POA: Diagnosis not present

## 2021-12-08 DIAGNOSIS — G4733 Obstructive sleep apnea (adult) (pediatric): Secondary | ICD-10-CM | POA: Diagnosis not present

## 2021-12-08 DIAGNOSIS — D649 Anemia, unspecified: Secondary | ICD-10-CM | POA: Diagnosis not present

## 2021-12-08 DIAGNOSIS — M109 Gout, unspecified: Secondary | ICD-10-CM | POA: Diagnosis not present

## 2021-12-08 DIAGNOSIS — M1611 Unilateral primary osteoarthritis, right hip: Secondary | ICD-10-CM | POA: Diagnosis not present

## 2021-12-08 DIAGNOSIS — J9622 Acute and chronic respiratory failure with hypercapnia: Secondary | ICD-10-CM | POA: Diagnosis not present

## 2021-12-08 DIAGNOSIS — I110 Hypertensive heart disease with heart failure: Secondary | ICD-10-CM | POA: Diagnosis not present

## 2021-12-09 DIAGNOSIS — E119 Type 2 diabetes mellitus without complications: Secondary | ICD-10-CM | POA: Diagnosis not present

## 2021-12-09 DIAGNOSIS — M109 Gout, unspecified: Secondary | ICD-10-CM | POA: Diagnosis not present

## 2021-12-09 DIAGNOSIS — J9622 Acute and chronic respiratory failure with hypercapnia: Secondary | ICD-10-CM | POA: Diagnosis not present

## 2021-12-09 DIAGNOSIS — J9621 Acute and chronic respiratory failure with hypoxia: Secondary | ICD-10-CM | POA: Diagnosis not present

## 2021-12-09 DIAGNOSIS — G4733 Obstructive sleep apnea (adult) (pediatric): Secondary | ICD-10-CM | POA: Diagnosis not present

## 2021-12-09 DIAGNOSIS — I110 Hypertensive heart disease with heart failure: Secondary | ICD-10-CM | POA: Diagnosis not present

## 2021-12-09 DIAGNOSIS — I5031 Acute diastolic (congestive) heart failure: Secondary | ICD-10-CM | POA: Diagnosis not present

## 2021-12-09 DIAGNOSIS — D649 Anemia, unspecified: Secondary | ICD-10-CM | POA: Diagnosis not present

## 2021-12-09 DIAGNOSIS — M1611 Unilateral primary osteoarthritis, right hip: Secondary | ICD-10-CM | POA: Diagnosis not present

## 2021-12-09 NOTE — Telephone Encounter (Signed)
I called around to see who or where could do this for this patient this afternoon. Patient still has open cases with [facility] until 12/27/21 and they have nursing capabilities to do the lab draws. I spoke with Arlys John at [facility] and he stated we would have to send new orders over that are justifying the need to nursing to go out like medication mangement or something like that and then add a note to it that patient also needs blood drawn while there. Make sure what you need done is listed with Dx codes. Talk to me tomorrow if this does not make sense.   ?

## 2021-12-10 DIAGNOSIS — F330 Major depressive disorder, recurrent, mild: Secondary | ICD-10-CM | POA: Diagnosis not present

## 2021-12-10 DIAGNOSIS — F419 Anxiety disorder, unspecified: Secondary | ICD-10-CM | POA: Diagnosis not present

## 2021-12-12 NOTE — Telephone Encounter (Signed)
Please send an order for home care/treatment due to medication management.  Getting his follow-up labs will help with medication management. ? ?CBC/CMET/lipid/A1c E11.9.  ?Uric acid M10.9.  ? ?Thanks.  ?

## 2021-12-13 DIAGNOSIS — F330 Major depressive disorder, recurrent, mild: Secondary | ICD-10-CM | POA: Diagnosis not present

## 2021-12-13 DIAGNOSIS — F419 Anxiety disorder, unspecified: Secondary | ICD-10-CM | POA: Diagnosis not present

## 2021-12-14 DIAGNOSIS — M109 Gout, unspecified: Secondary | ICD-10-CM | POA: Diagnosis not present

## 2021-12-14 DIAGNOSIS — E119 Type 2 diabetes mellitus without complications: Secondary | ICD-10-CM | POA: Diagnosis not present

## 2021-12-14 DIAGNOSIS — J9622 Acute and chronic respiratory failure with hypercapnia: Secondary | ICD-10-CM | POA: Diagnosis not present

## 2021-12-14 DIAGNOSIS — J9621 Acute and chronic respiratory failure with hypoxia: Secondary | ICD-10-CM | POA: Diagnosis not present

## 2021-12-14 DIAGNOSIS — I110 Hypertensive heart disease with heart failure: Secondary | ICD-10-CM | POA: Diagnosis not present

## 2021-12-14 DIAGNOSIS — I5031 Acute diastolic (congestive) heart failure: Secondary | ICD-10-CM | POA: Diagnosis not present

## 2021-12-14 DIAGNOSIS — M1611 Unilateral primary osteoarthritis, right hip: Secondary | ICD-10-CM | POA: Diagnosis not present

## 2021-12-14 DIAGNOSIS — G4733 Obstructive sleep apnea (adult) (pediatric): Secondary | ICD-10-CM | POA: Diagnosis not present

## 2021-12-14 DIAGNOSIS — D649 Anemia, unspecified: Secondary | ICD-10-CM | POA: Diagnosis not present

## 2021-12-14 NOTE — Telephone Encounter (Signed)
Letter has been done and faxed to [facility].  ?

## 2021-12-15 DIAGNOSIS — G4733 Obstructive sleep apnea (adult) (pediatric): Secondary | ICD-10-CM | POA: Diagnosis not present

## 2021-12-15 DIAGNOSIS — I5031 Acute diastolic (congestive) heart failure: Secondary | ICD-10-CM | POA: Diagnosis not present

## 2021-12-15 DIAGNOSIS — D649 Anemia, unspecified: Secondary | ICD-10-CM | POA: Diagnosis not present

## 2021-12-15 DIAGNOSIS — M109 Gout, unspecified: Secondary | ICD-10-CM | POA: Diagnosis not present

## 2021-12-15 DIAGNOSIS — I110 Hypertensive heart disease with heart failure: Secondary | ICD-10-CM | POA: Diagnosis not present

## 2021-12-15 DIAGNOSIS — J9622 Acute and chronic respiratory failure with hypercapnia: Secondary | ICD-10-CM | POA: Diagnosis not present

## 2021-12-15 DIAGNOSIS — J9621 Acute and chronic respiratory failure with hypoxia: Secondary | ICD-10-CM | POA: Diagnosis not present

## 2021-12-15 DIAGNOSIS — M1611 Unilateral primary osteoarthritis, right hip: Secondary | ICD-10-CM | POA: Diagnosis not present

## 2021-12-15 DIAGNOSIS — E119 Type 2 diabetes mellitus without complications: Secondary | ICD-10-CM | POA: Diagnosis not present

## 2021-12-16 DIAGNOSIS — J9622 Acute and chronic respiratory failure with hypercapnia: Secondary | ICD-10-CM | POA: Diagnosis not present

## 2021-12-16 DIAGNOSIS — J9621 Acute and chronic respiratory failure with hypoxia: Secondary | ICD-10-CM | POA: Diagnosis not present

## 2021-12-16 DIAGNOSIS — M1611 Unilateral primary osteoarthritis, right hip: Secondary | ICD-10-CM | POA: Diagnosis not present

## 2021-12-16 DIAGNOSIS — F330 Major depressive disorder, recurrent, mild: Secondary | ICD-10-CM | POA: Diagnosis not present

## 2021-12-16 DIAGNOSIS — D649 Anemia, unspecified: Secondary | ICD-10-CM | POA: Diagnosis not present

## 2021-12-16 DIAGNOSIS — E119 Type 2 diabetes mellitus without complications: Secondary | ICD-10-CM | POA: Diagnosis not present

## 2021-12-16 DIAGNOSIS — F419 Anxiety disorder, unspecified: Secondary | ICD-10-CM | POA: Diagnosis not present

## 2021-12-16 DIAGNOSIS — M109 Gout, unspecified: Secondary | ICD-10-CM | POA: Diagnosis not present

## 2021-12-16 DIAGNOSIS — G4733 Obstructive sleep apnea (adult) (pediatric): Secondary | ICD-10-CM | POA: Diagnosis not present

## 2021-12-16 DIAGNOSIS — I5031 Acute diastolic (congestive) heart failure: Secondary | ICD-10-CM | POA: Diagnosis not present

## 2021-12-16 DIAGNOSIS — I110 Hypertensive heart disease with heart failure: Secondary | ICD-10-CM | POA: Diagnosis not present

## 2021-12-17 ENCOUNTER — Ambulatory Visit (INDEPENDENT_AMBULATORY_CARE_PROVIDER_SITE_OTHER): Payer: Medicare HMO | Admitting: *Deleted

## 2021-12-17 DIAGNOSIS — E119 Type 2 diabetes mellitus without complications: Secondary | ICD-10-CM

## 2021-12-17 DIAGNOSIS — G4733 Obstructive sleep apnea (adult) (pediatric): Secondary | ICD-10-CM

## 2021-12-17 DIAGNOSIS — G8929 Other chronic pain: Secondary | ICD-10-CM

## 2021-12-17 DIAGNOSIS — Z7401 Bed confinement status: Secondary | ICD-10-CM

## 2021-12-17 DIAGNOSIS — E66813 Obesity, class 3: Secondary | ICD-10-CM

## 2021-12-17 DIAGNOSIS — F339 Major depressive disorder, recurrent, unspecified: Secondary | ICD-10-CM

## 2021-12-18 NOTE — Chronic Care Management (AMB) (Signed)
?Chronic Care Management  ? ? Clinical Social Work Note ? ?12/18/2021 ?Name: Michael Payne MRN: [id number] DOB: 12/20/1970 ? ?Michael Payne is a 51 y.o. year old male who is a primary care patient of Joaquim NamDuncan, Graham S, MD. The CCM team was consulted to assist the patient with chronic disease management and/or care coordination needs related to: Transportation Needs , [employer] Resources , Level of Care Concerns, and Mental Health Counseling and Resources.  ? ?Engaged with patient by telephone for follow up visit in response to provider referral for social work chronic care management and care coordination services.  ? ?Consent to Services:  ?The patient was given information about Chronic Care Management services, agreed to services, and gave verbal consent prior to initiation of services.  Please see initial visit note for detailed documentation.  ? ?Patient agreed to services and consent obtained.  ? ?Assessment: Review of patient past medical history, allergies, medications, and health status, including review of relevant consultants reports was performed today as part of a comprehensive evaluation and provision of chronic care management and care coordination services.    ? ?SDOH (Social Determinants of Health) assessments and interventions performed:   ? ?Advanced Directives Status: Not addressed in this encounter. ? ?CCM Care Plan ? ?Allergies  ?Allergen Reactions  ? Aspirin Other (See Comments)  ?  Upset stomach  ? Lactose Intolerance (Gi) Nausea And Vomiting  ? Lisinopril   ?  Causes cough  ? ? ?Outpatient Encounter Medications as of 12/17/2021  ?Medication Sig  ? acetaminophen (TYLENOL) 325 MG tablet Take 2 tablets (650 mg total) by mouth every 6 (six) hours as needed for headache or mild pain.  ? allopurinol (ZYLOPRIM) 100 MG tablet Take 1 tablet (100 mg total) by mouth every other day.  ? amLODipine (NORVASC) 10 MG tablet Take 1 tablet (10 mg total) by mouth daily.  ? carvedilol (COREG) 3.125 MG tablet TAKE 1  TABLET BY MOUTH TWICE A DAY WITH A MEAL  ? Colchicine (MITIGARE) 0.6 MG CAPS Take 0.5 tablets by mouth daily as needed (for gout.  okay to fill with mitigare).  ? Dulaglutide (TRULICITY) 1.5 MG/0.5ML SOPN Inject 3 mg into the skin once a week.  ? furosemide (LASIX) 40 MG tablet TAKE 1 TABLET BY MOUTH EVERY DAY  ? KLOR-CON M10 10 MEQ tablet TAKE 1 TABLET BY MOUTH EVERY DAY  ? Multiple Vitamins-Minerals (MULTIVITAMIN WITH MINERALS) tablet Take 1 tablet by mouth daily.  ? naloxone (NARCAN) nasal spray 4 mg/0.1 mL Spray nostril if needed for opioid overuse- decreased consciousness, decreased respirations  ? oxyCODONE-acetaminophen (PERCOCET) 10-325 MG tablet Take 1 tablet by mouth every 6 (six) hours as needed for pain (Fill on or after 02/22/2022).  ? oxyCODONE-acetaminophen (PERCOCET) 10-325 MG tablet Take 1 tablet by mouth every 6 (six) hours as needed for pain (Fill on or after 01/23/2022).  ? oxyCODONE-acetaminophen (PERCOCET) 10-325 MG tablet Take 1 tablet by mouth every 6 (six) hours as needed for pain (Fill on or after 12/24/2021).  ? vitamin C (ASCORBIC ACID) 500 MG tablet Take 500 mg by mouth daily.  ? ?No facility-administered encounter medications on file as of 12/17/2021.  ? ? ?Patient Active Problem List  ? Diagnosis Date Noted  ? Nonobstructive transaminitis 06/28/2021  ? Decubitus ulcer of posterior thigh, stage 2 (HCC) 06/28/2021  ? Acute kidney injury (resolved) 06/28/2021  ? Pre-diabetes 06/10/2021  ? Obesity, Class III, BMI 40-49.9 (morbid obesity) (HCC) 06/10/2021  ? Osteoarthritis 06/10/2021  ? Pressure injury  of skin 06/09/2021  ? Respiratory failure (HCC) 06/01/2021  ? Acute diastolic CHF (congestive heart failure) (HCC) 05/31/2021  ? CHF (congestive heart failure) (HCC) 05/31/2021  ? Hypoxia 05/31/2021  ? Gout, unspecified 03/10/2021  ? Edema 03/10/2021  ? Diabetes mellitus without complication (HCC) 03/10/2021  ? Muscle spasms of both lower extremities 07/29/2020  ? Bedbound 05/22/2020  ? Unilateral  osteoarthritis of hip, right 12/27/2019  ? Severe obstructive sleep apnea 02/22/2018  ? Hypertriglyceridemia 08/24/2017  ? Low HDL (under 40) 08/24/2017  ? Metabolic syndrome 08/24/2017  ? Morbid obesity (HCC) 10/14/2013  ? HTN (hypertension) 10/14/2013  ? ? ?Conditions to be addressed/monitored: DMII and Depression; Limited social support, Transportation, ADL IADL limitations, and Mental Health Concerns  ? ?Care Plan : LCSW Plan of Care  ?Updates made by Buck Mam, LCSW since 12/18/2021 12:00 AM  ?  ? ?Problem: Social and Functional Symptoms   ?  ? ?Long-Range Goal: Optimize pt's quality of life and opportunities   ?Start Date: 01/25/2021  ?Expected End Date: 03/18/2022  ?Recent Progress: On track  ?Priority: High  ?Note:   ?Current barriers:   ?Transportation, Housing barriers, Level of care concerns, ADL IADL limitations, Social Isolation, Limited access to caregiver, Inability to perform ADL's independently, Inability to perform IADL's independently, and Lacks knowledge of community resource ?Clinical Goals: Patient will work with CSW  to address needs related to transportation, PCS/CAPS and other needs as assessed and identified ?Clinical Interventions:  ?12/17/21- Pt reports doing better! He is able to stand and take a few steps, moving in bed more independently and feels he is losing weight too. He continues to have HHPT and OT weekly as well as involved with Dietitian  for healthy eating/weight loss, etc- hoping to be approved for bariatric surgery. He shared that he feels a relief since his wife has moved out; continues with his counseling and is optimistic and motivated. CSW offered encouragement and support.  ?09/28/21- CSW spoke with pt who reports he has been assigned to a different HHPT througgh [facility] and states; "he is tall and pushing me....works me hard".  Pt is able to see progress with his PT as well as weight-does not know what his actual weight is but feels he can "move around in bed  easier and abilities.  Pt states he can sit on edge of bed and "stand but not strong enough yet to lock in my knees".  ? ?He as able to see the Nutritionist with his daughter/caregiver- "we are on the same accord".  Pt continues with his counseling support and says this is also gong well .  ?He is in good spirits, motivated and mentions needing help getting a different type of walker- one without a seat and wheels on the back.  Pt will have HHPT call to give specific needs to CSW who can then pass on to PCP for ordering.  ?Also alerted pt to PCP wanting to get labwork arranged- pt reports he was able to get [facility] to come out to draw the labs in the past (but because of his LASIX was unsuccessful)- advised pt PCP is coordinating this to be done again this Spring for PCP follow up. ?Encouragement and support offered to pt . ? ?  07/21/21: Received call from Lasandra Beech, LCSW/[facility], indicating pt was in need of transportation to see a Cardiologist for surgical clearance. CSW advised and provided the steps to seek insurance waiver/auth for this.  ? ?07/06/21- Pt reports being released from  hospital back to home where he has family assistance. Per pt, he is awaiting Home Health visits as well as his PCS care to be resumed. He states he is on the waiting list for CAPS.  ?Pt continues to seek weight loss surgery and states "I have a bunch of stuff to do".    ?CSW offered encouragement with all his goals.  ? ?date of comprehensive plan of care as evidenced by provider attestation and co-signature ?Inter-disciplinary care team collaboration (see longitudinal plan of care) ?Assessment of needs, barriers , agencies contacted, as well as how impacting  ?Review various resources, discussed options and provided patient information about Department of Social Services (food stamps, [employer], and utilities assistance), Transportation provided by insurance provider, Enhanced Benefits connected with insurance provider, Referral  to care guide for community support and other options, SCAT transportation, and [employer] Alternative Program CAP ?Patient interviewed and appropriate assessments performed ?Referred patient to community r

## 2021-12-18 NOTE — Patient Instructions (Addendum)
Visit Information ? ?Thank you for taking time to visit with me today. Please don't hesitate to contact me if I can be of assistance to you before our next scheduled telephone appointment. ? ?Following are the goals we discussed today:  ?Keep up the good work!  ? ?Our next appointment is by telephone on 01/28/22  at 1PM ? ?Please call the care guide team at [phone] if you need to cancel or reschedule your appointment.  ? ?If you are experiencing a Mental Health or Behavioral Health Crisis or need someone to talk to, please call the [place] National Suicide Prevention Lifeline: 6707456474 or TTY: 564-454-0139 TTY [phone]) [phone]) to talk to a trained counselor ?go to [facility] [address] [place] [phone]) ?call the [facility]: [phone] ?call 911  ? ?Patient verbalizes understanding of instructions and care plan provided today and agrees to view in MyChart. Active MyChart status confirmed with patient.   ? ?Reece Levy MSW, LCSW ?Licensed Clinical Social Worker ?[facility]   ?[phone]  ?

## 2021-12-20 DIAGNOSIS — R32 Unspecified urinary incontinence: Secondary | ICD-10-CM | POA: Diagnosis not present

## 2021-12-21 DIAGNOSIS — J9621 Acute and chronic respiratory failure with hypoxia: Secondary | ICD-10-CM | POA: Diagnosis not present

## 2021-12-21 DIAGNOSIS — M109 Gout, unspecified: Secondary | ICD-10-CM | POA: Diagnosis not present

## 2021-12-21 DIAGNOSIS — D649 Anemia, unspecified: Secondary | ICD-10-CM | POA: Diagnosis not present

## 2021-12-21 DIAGNOSIS — E119 Type 2 diabetes mellitus without complications: Secondary | ICD-10-CM | POA: Diagnosis not present

## 2021-12-21 DIAGNOSIS — G4733 Obstructive sleep apnea (adult) (pediatric): Secondary | ICD-10-CM | POA: Diagnosis not present

## 2021-12-21 DIAGNOSIS — J9622 Acute and chronic respiratory failure with hypercapnia: Secondary | ICD-10-CM | POA: Diagnosis not present

## 2021-12-21 DIAGNOSIS — M1611 Unilateral primary osteoarthritis, right hip: Secondary | ICD-10-CM | POA: Diagnosis not present

## 2021-12-21 DIAGNOSIS — I5031 Acute diastolic (congestive) heart failure: Secondary | ICD-10-CM | POA: Diagnosis not present

## 2021-12-21 DIAGNOSIS — I110 Hypertensive heart disease with heart failure: Secondary | ICD-10-CM | POA: Diagnosis not present

## 2021-12-22 DIAGNOSIS — F419 Anxiety disorder, unspecified: Secondary | ICD-10-CM | POA: Diagnosis not present

## 2021-12-22 DIAGNOSIS — F331 Major depressive disorder, recurrent, moderate: Secondary | ICD-10-CM | POA: Diagnosis not present

## 2021-12-23 ENCOUNTER — Encounter: Payer: Medicare HMO | Attending: Surgery | Admitting: Skilled Nursing Facility1

## 2021-12-23 DIAGNOSIS — I5031 Acute diastolic (congestive) heart failure: Secondary | ICD-10-CM | POA: Diagnosis not present

## 2021-12-23 DIAGNOSIS — E119 Type 2 diabetes mellitus without complications: Secondary | ICD-10-CM | POA: Diagnosis not present

## 2021-12-23 DIAGNOSIS — D649 Anemia, unspecified: Secondary | ICD-10-CM | POA: Diagnosis not present

## 2021-12-23 DIAGNOSIS — J9621 Acute and chronic respiratory failure with hypoxia: Secondary | ICD-10-CM | POA: Diagnosis not present

## 2021-12-23 DIAGNOSIS — M109 Gout, unspecified: Secondary | ICD-10-CM | POA: Diagnosis not present

## 2021-12-23 DIAGNOSIS — I110 Hypertensive heart disease with heart failure: Secondary | ICD-10-CM | POA: Diagnosis not present

## 2021-12-23 DIAGNOSIS — G4733 Obstructive sleep apnea (adult) (pediatric): Secondary | ICD-10-CM | POA: Diagnosis not present

## 2021-12-23 DIAGNOSIS — J9622 Acute and chronic respiratory failure with hypercapnia: Secondary | ICD-10-CM | POA: Diagnosis not present

## 2021-12-23 DIAGNOSIS — M1611 Unilateral primary osteoarthritis, right hip: Secondary | ICD-10-CM | POA: Diagnosis not present

## 2021-12-23 NOTE — Progress Notes (Signed)
Supervised Weight Loss Visit ?Bariatric Nutrition Education ? ?Sleeve gastrectomy  ? ?4 out of 6 SWL Appointments  ? ?Virtual appointment; Pt agreeable to limitations of this visit type; Pt identified by name and date of birth. ? ? ?NUTRITION ASSESSMENT ? ?Anthropometrics  ?Start weight at [facility]: Pt is bedbound ?Weight: pt is bedbound ? ?Clinical  ?Medical hx: Chf, Respiratory Failure, Diabetes, osteoarthritis, OSA ?Medications: allopurinol, Trulicity, furosemide, oxycodone, amlodpine, potassium-chloride, tylenol, vitamin c, multi  ?Labs: A1C 6.5 ?Notable signs/symptoms:  ?Any previous deficiencies? No ? ?Lifestyle & Dietary Hx ? ?Pts daughter makes his breakfast and dinner and is very supportive: Angelina  ?Pt states he is on a fast right now for church, but also felt he needed to also, so just eating vegetables. ?Pt states he has not been eating fast food or sweets.  ?Pt states the rice made him feel too full  ?Pt states he is having his CNA cook more instead if daughter ?Pt states he chews his food 36 times. ?Pt states his counselor advised him to draw ?Pt states he has taken steps! ? ?Estimated daily fluid intake: 125 oz ?Supplements:  ?Current average weekly physical activity: bedbound/PT ? ?24-Hr Dietary Recall: 3 times a day fruit; sometimes snacks in between 1-2 ?First Meal: sauteed vegetables ?Snack: carrots and celery with peanut butter ?Second Meal: sauteed vegetables + rice + rice ?Snack: fruit or vegan fig bar or 3-4 graham crackers + peanut butter ?Third Meal: sauteed vegetables (summer squash, onion, peppers, mushrooms, asparagus + garlic) powder onion soup over it + rice + butter ?Snack: no sugar added pudding sugar free brownie with sugar free whipped cream ?Beverages: water, hot tea + lemon + ginsing + honey + probiotics  ? ?Estimated Energy Needs ?Calories: 2200 ? ? ?NUTRITION DIAGNOSIS  ?Overweight/obesity ([place]-3.3) related to past poor dietary habits and physical inactivity as evidenced by patient  w/ planned sleeve gastrectomy surgery following dietary guidelines for continued weight loss. ? ? ?NUTRITION INTERVENTION  ?Nutrition counseling (C-1) and education (E-2) to facilitate bariatric surgery goals. ? ?Pre-Op Goals Progress & New Goals ?Great up the great work! ?Continue: Be sitting up for our next appt ?Continue: drink on the fluid throughout the day instead of chugging at once ?NEW: aim for stopping one bite before that feeling of fullness ? ?Handouts Provided Include  ? ? ?Learning Style & Readiness for Change ?Teaching method utilized: Visual & Auditory  ?Demonstrated degree of understanding via: Teach Back  ?Readiness Level: action ?Barriers to learning/adherence to lifestyle change: none; his daughter is providing his meals which is following a healthy diet ? ?RD's Notes for next Visit  ?Assess pts adherence to chosen goals ? ? ?MONITORING & EVALUATION ?Dietary intake, weekly physical activity, body weight, and pre-op goals in 1 month.  ? ?Next Steps  ?Patient is to return to [facility] in 1 month ?

## 2021-12-24 DIAGNOSIS — M109 Gout, unspecified: Secondary | ICD-10-CM | POA: Diagnosis not present

## 2021-12-24 DIAGNOSIS — J9610 Chronic respiratory failure, unspecified whether with hypoxia or hypercapnia: Secondary | ICD-10-CM | POA: Diagnosis not present

## 2021-12-24 DIAGNOSIS — J9622 Acute and chronic respiratory failure with hypercapnia: Secondary | ICD-10-CM | POA: Diagnosis not present

## 2021-12-24 DIAGNOSIS — J9621 Acute and chronic respiratory failure with hypoxia: Secondary | ICD-10-CM | POA: Diagnosis not present

## 2021-12-24 DIAGNOSIS — E119 Type 2 diabetes mellitus without complications: Secondary | ICD-10-CM | POA: Diagnosis not present

## 2021-12-24 DIAGNOSIS — M1611 Unilateral primary osteoarthritis, right hip: Secondary | ICD-10-CM | POA: Diagnosis not present

## 2021-12-24 DIAGNOSIS — D649 Anemia, unspecified: Secondary | ICD-10-CM | POA: Diagnosis not present

## 2021-12-24 DIAGNOSIS — I110 Hypertensive heart disease with heart failure: Secondary | ICD-10-CM | POA: Diagnosis not present

## 2021-12-24 DIAGNOSIS — G4733 Obstructive sleep apnea (adult) (pediatric): Secondary | ICD-10-CM | POA: Diagnosis not present

## 2021-12-24 DIAGNOSIS — I5031 Acute diastolic (congestive) heart failure: Secondary | ICD-10-CM | POA: Diagnosis not present

## 2021-12-25 DIAGNOSIS — F419 Anxiety disorder, unspecified: Secondary | ICD-10-CM | POA: Diagnosis not present

## 2021-12-25 DIAGNOSIS — F331 Major depressive disorder, recurrent, moderate: Secondary | ICD-10-CM | POA: Diagnosis not present

## 2021-12-27 ENCOUNTER — Telehealth: Payer: Self-pay

## 2021-12-27 ENCOUNTER — Encounter: Payer: Self-pay | Admitting: Family Medicine

## 2021-12-27 ENCOUNTER — Telehealth: Payer: Medicare HMO

## 2021-12-27 DIAGNOSIS — J9621 Acute and chronic respiratory failure with hypoxia: Secondary | ICD-10-CM | POA: Diagnosis not present

## 2021-12-27 DIAGNOSIS — I5031 Acute diastolic (congestive) heart failure: Secondary | ICD-10-CM | POA: Diagnosis not present

## 2021-12-27 DIAGNOSIS — M1611 Unilateral primary osteoarthritis, right hip: Secondary | ICD-10-CM | POA: Diagnosis not present

## 2021-12-27 DIAGNOSIS — J9622 Acute and chronic respiratory failure with hypercapnia: Secondary | ICD-10-CM | POA: Diagnosis not present

## 2021-12-27 DIAGNOSIS — D649 Anemia, unspecified: Secondary | ICD-10-CM | POA: Diagnosis not present

## 2021-12-27 DIAGNOSIS — M109 Gout, unspecified: Secondary | ICD-10-CM | POA: Diagnosis not present

## 2021-12-27 DIAGNOSIS — G4733 Obstructive sleep apnea (adult) (pediatric): Secondary | ICD-10-CM | POA: Diagnosis not present

## 2021-12-27 DIAGNOSIS — E119 Type 2 diabetes mellitus without complications: Secondary | ICD-10-CM | POA: Diagnosis not present

## 2021-12-27 DIAGNOSIS — I110 Hypertensive heart disease with heart failure: Secondary | ICD-10-CM | POA: Diagnosis not present

## 2021-12-27 NOTE — Telephone Encounter (Signed)
Verbal orders given to [employer] ?

## 2021-12-27 NOTE — Telephone Encounter (Signed)
Tried to call Anda Kraft with [facility] but no answer and VM is full  ?

## 2021-12-27 NOTE — Chronic Care Management (AMB) (Signed)
? ? ?Chronic Care Management ?Pharmacy Assistant  ? ?Name: Michael Payne  MRN: [id number] DOB: 1971-01-15 ? ?Reason for Encounter: General Adherence  ?  ?Recent office visits:  ?12/03/21-PCP-[name],MD-Telemedicine-follow up chronic pain syndrome-labs done in home soon.No medication changes ? ?Recent consult visits:  ?None since last CCM contact ? ?Hospital visits:  ?None in previous 6 months ? ?Medications: ?Outpatient Encounter Medications as of 12/27/2021  ?Medication Sig  ? acetaminophen (TYLENOL) 325 MG tablet Take 2 tablets (650 mg total) by mouth every 6 (six) hours as needed for headache or mild pain.  ? allopurinol (ZYLOPRIM) 100 MG tablet Take 1 tablet (100 mg total) by mouth every other day.  ? amLODipine (NORVASC) 10 MG tablet Take 1 tablet (10 mg total) by mouth daily.  ? carvedilol (COREG) 3.125 MG tablet TAKE 1 TABLET BY MOUTH TWICE A DAY WITH A MEAL  ? Colchicine (MITIGARE) 0.6 MG CAPS Take 0.5 tablets by mouth daily as needed (for gout.  okay to fill with mitigare).  ? Dulaglutide (TRULICITY) 1.5 0000000 SOPN Inject 3 mg into the skin once a week.  ? furosemide (LASIX) 40 MG tablet TAKE 1 TABLET BY MOUTH EVERY DAY  ? KLOR-CON M10 10 MEQ tablet TAKE 1 TABLET BY MOUTH EVERY DAY  ? Multiple Vitamins-Minerals (MULTIVITAMIN WITH MINERALS) tablet Take 1 tablet by mouth daily.  ? naloxone (NARCAN) nasal spray 4 mg/0.1 mL Spray nostril if needed for opioid overuse- decreased consciousness, decreased respirations  ? oxyCODONE-acetaminophen (PERCOCET) 10-325 MG tablet Take 1 tablet by mouth every 6 (six) hours as needed for pain (Fill on or after 02/22/2022).  ? oxyCODONE-acetaminophen (PERCOCET) 10-325 MG tablet Take 1 tablet by mouth every 6 (six) hours as needed for pain (Fill on or after 01/23/2022).  ? oxyCODONE-acetaminophen (PERCOCET) 10-325 MG tablet Take 1 tablet by mouth every 6 (six) hours as needed for pain (Fill on or after 12/24/2021).  ? vitamin C (ASCORBIC ACID) 500 MG tablet Take 500 mg by  mouth daily.  ? ?No facility-administered encounter medications on file as of 12/27/2021.  ? ? ? ?Contacted [facility] on 12/27/21 for general disease state and medication adherence call.  ? ?Patient is not more than 5 days past due for refill on the following medications per chart history: ? ?[facility] Medications: ?Medication Name/mg Last Fill Days Supply ?Trulicity 3mg    11/29/21 28      Patient is not using (hard to get,pharmacy closed,etc.) ?Furosemide 40mg   10/18/21 90 ?Amlodipine 10mg   12/17/21 90 ?Carvedilol 3.125mg    10/18/21 90 ? ? ?What concerns do you have about your medications?  Trulicty is hard to find, patient has limited access to transportation as he does not walk at all at this time.  ? ?The patient denies side effects with their medications.  ? ?How often do you forget or accidentally miss a dose? Rarely  forgot  2 times last week it was late and he forgot  ? ?Do you use a pillbox? Yes  weekly  ? ?Are you having any problems getting your medications from your pharmacy? Yes   ? ?Has the cost of your medications been a concern? No- no copay any longer ? ?Since last visit with CPP, the following interventions have been made. Patient has stopped all sodas and all sugars- drinks plenty of water  ? ?The patient has not had an ED visit since last contact.  ? ?The patient denies problems with their health. Improving with the help of in home PT, patient  is standing longer periods of time and ambulating a few steps as well. ? ?Patient denies concerns or questions for Charlene Brooke, PharmD at this time.  ? ?Counseled patient on:  ?Importance of taking medication daily without missed doses and Access to CCM team for any cost, medication or pharmacy concerns. ? ? ?Care Gaps: ?Annual wellness visit in last year? Yes ?Most Recent BP reading:148/95  95-P  07/26/21 ? ?If Diabetic: ?Most recent A1C reading:5.9  06/01/21 ?Last eye exam / retinopathy screening:11/22/21 ?Last diabetic foot exam:  has therapist check , but  formal exam overdue  ? ?Upcoming appointments: ?CCM appointment on 01/03/22  care manager call ? ? ?Charlene Brooke, CPP notified ? ?Haruki Arnold, CCMA ?Health concierge  ?[phone]  ?

## 2021-12-27 NOTE — Telephone Encounter (Signed)
Please give the order.  Thanks.   

## 2021-12-27 NOTE — Telephone Encounter (Signed)
Michael Payne from [facility]  ?[phone] ok to leave v/m  ? ?Verbal orders  ?HH pt  ?1 a week 1  ?2 a week for 8 weeks  ?

## 2021-12-28 DIAGNOSIS — J9622 Acute and chronic respiratory failure with hypercapnia: Secondary | ICD-10-CM | POA: Diagnosis not present

## 2021-12-28 DIAGNOSIS — M109 Gout, unspecified: Secondary | ICD-10-CM | POA: Diagnosis not present

## 2021-12-28 DIAGNOSIS — I110 Hypertensive heart disease with heart failure: Secondary | ICD-10-CM | POA: Diagnosis not present

## 2021-12-28 DIAGNOSIS — F419 Anxiety disorder, unspecified: Secondary | ICD-10-CM | POA: Diagnosis not present

## 2021-12-28 DIAGNOSIS — E119 Type 2 diabetes mellitus without complications: Secondary | ICD-10-CM | POA: Diagnosis not present

## 2021-12-28 DIAGNOSIS — I5031 Acute diastolic (congestive) heart failure: Secondary | ICD-10-CM | POA: Diagnosis not present

## 2021-12-28 DIAGNOSIS — D649 Anemia, unspecified: Secondary | ICD-10-CM | POA: Diagnosis not present

## 2021-12-28 DIAGNOSIS — F331 Major depressive disorder, recurrent, moderate: Secondary | ICD-10-CM | POA: Diagnosis not present

## 2021-12-28 DIAGNOSIS — J9621 Acute and chronic respiratory failure with hypoxia: Secondary | ICD-10-CM | POA: Diagnosis not present

## 2021-12-28 DIAGNOSIS — G4733 Obstructive sleep apnea (adult) (pediatric): Secondary | ICD-10-CM | POA: Diagnosis not present

## 2021-12-28 DIAGNOSIS — M1611 Unilateral primary osteoarthritis, right hip: Secondary | ICD-10-CM | POA: Diagnosis not present

## 2021-12-29 ENCOUNTER — Telehealth: Payer: Self-pay | Admitting: Family Medicine

## 2021-12-29 DIAGNOSIS — D649 Anemia, unspecified: Secondary | ICD-10-CM | POA: Diagnosis not present

## 2021-12-29 DIAGNOSIS — I110 Hypertensive heart disease with heart failure: Secondary | ICD-10-CM | POA: Diagnosis not present

## 2021-12-29 DIAGNOSIS — E119 Type 2 diabetes mellitus without complications: Secondary | ICD-10-CM | POA: Diagnosis not present

## 2021-12-29 DIAGNOSIS — M1611 Unilateral primary osteoarthritis, right hip: Secondary | ICD-10-CM | POA: Diagnosis not present

## 2021-12-29 DIAGNOSIS — J9622 Acute and chronic respiratory failure with hypercapnia: Secondary | ICD-10-CM | POA: Diagnosis not present

## 2021-12-29 DIAGNOSIS — I5031 Acute diastolic (congestive) heart failure: Secondary | ICD-10-CM | POA: Diagnosis not present

## 2021-12-29 DIAGNOSIS — G4733 Obstructive sleep apnea (adult) (pediatric): Secondary | ICD-10-CM | POA: Diagnosis not present

## 2021-12-29 DIAGNOSIS — J9621 Acute and chronic respiratory failure with hypoxia: Secondary | ICD-10-CM | POA: Diagnosis not present

## 2021-12-29 DIAGNOSIS — M109 Gout, unspecified: Secondary | ICD-10-CM | POA: Diagnosis not present

## 2021-12-29 NOTE — Telephone Encounter (Signed)
Type of forms received:Health Benefits ? ?Routed WU:[id number]:Jessica I ? ?Paperwork received by : Lesly Rubensteinindy S ? ? ?Individual made aware of 3-5 business day turn around (Y/N): ? ?Form completed and patient made aware of charges(Y/N): ? ? ?Faxed to :  ? ?Form location: Place in mail box ? ?

## 2021-12-29 NOTE — Telephone Encounter (Signed)
Placed in Dr. Duncan's inbox 

## 2021-12-30 DIAGNOSIS — F419 Anxiety disorder, unspecified: Secondary | ICD-10-CM | POA: Diagnosis not present

## 2021-12-30 DIAGNOSIS — F331 Major depressive disorder, recurrent, moderate: Secondary | ICD-10-CM | POA: Diagnosis not present

## 2021-12-30 NOTE — Telephone Encounter (Signed)
I'll work on the hard copy when I am back in clinic.  Thanks.  ?

## 2021-12-31 DIAGNOSIS — I110 Hypertensive heart disease with heart failure: Secondary | ICD-10-CM | POA: Diagnosis not present

## 2021-12-31 DIAGNOSIS — M1611 Unilateral primary osteoarthritis, right hip: Secondary | ICD-10-CM | POA: Diagnosis not present

## 2021-12-31 DIAGNOSIS — E119 Type 2 diabetes mellitus without complications: Secondary | ICD-10-CM | POA: Diagnosis not present

## 2021-12-31 DIAGNOSIS — M109 Gout, unspecified: Secondary | ICD-10-CM | POA: Diagnosis not present

## 2021-12-31 DIAGNOSIS — J9622 Acute and chronic respiratory failure with hypercapnia: Secondary | ICD-10-CM | POA: Diagnosis not present

## 2021-12-31 DIAGNOSIS — I5031 Acute diastolic (congestive) heart failure: Secondary | ICD-10-CM | POA: Diagnosis not present

## 2021-12-31 DIAGNOSIS — J9621 Acute and chronic respiratory failure with hypoxia: Secondary | ICD-10-CM | POA: Diagnosis not present

## 2021-12-31 DIAGNOSIS — D649 Anemia, unspecified: Secondary | ICD-10-CM | POA: Diagnosis not present

## 2021-12-31 DIAGNOSIS — G4733 Obstructive sleep apnea (adult) (pediatric): Secondary | ICD-10-CM | POA: Diagnosis not present

## 2022-01-03 ENCOUNTER — Ambulatory Visit (INDEPENDENT_AMBULATORY_CARE_PROVIDER_SITE_OTHER): Payer: Medicare HMO

## 2022-01-03 DIAGNOSIS — I5032 Chronic diastolic (congestive) heart failure: Secondary | ICD-10-CM

## 2022-01-03 DIAGNOSIS — M160 Bilateral primary osteoarthritis of hip: Secondary | ICD-10-CM

## 2022-01-03 DIAGNOSIS — I10 Essential (primary) hypertension: Secondary | ICD-10-CM

## 2022-01-03 DIAGNOSIS — F331 Major depressive disorder, recurrent, moderate: Secondary | ICD-10-CM | POA: Diagnosis not present

## 2022-01-03 DIAGNOSIS — F419 Anxiety disorder, unspecified: Secondary | ICD-10-CM | POA: Diagnosis not present

## 2022-01-03 NOTE — Chronic Care Management (AMB) (Signed)
?Chronic Care Management  ? ?CCM RN Visit Note ? ?01/03/2022 ?Name: Michael Payne MRN: [id number] DOB: 09/28/1970 ? ?Subjective: ?Michael Payne is a 51 y.o. year old male who is a primary care patient of Michael Ghent, MD. The care management team was consulted for assistance with disease management and care coordination needs.   ? ?Engaged with patient by telephone for follow up visit in response to provider referral for case management and/or care coordination services.  ? ?Consent to Services:  ?The patient was given information about Chronic Care Management services, agreed to services, and gave verbal consent prior to initiation of services.  Please see initial visit note for detailed documentation.  ? ?Patient agreed to services and verbal consent obtained.  ? ?Assessment: Review of patient past medical history, allergies, medications, health status, including review of consultants reports, laboratory and other test data, was performed as part of comprehensive evaluation and provision of chronic care management services.  ? ?SDOH (Social Determinants of Health) assessments and interventions performed:   ? ?CCM Care Plan ? ?Allergies  ?Allergen Reactions  ? Aspirin Other (See Comments)  ?  Upset stomach  ? Lactose Intolerance (Gi) Nausea And Vomiting  ? Lisinopril   ?  Causes cough  ? ? ?Outpatient Encounter Medications as of 01/03/2022  ?Medication Sig  ? acetaminophen (TYLENOL) 325 MG tablet Take 2 tablets (650 mg total) by mouth every 6 (six) hours as needed for headache or mild pain.  ? allopurinol (ZYLOPRIM) 100 MG tablet Take 1 tablet (100 mg total) by mouth every other day.  ? amLODipine (NORVASC) 10 MG tablet Take 1 tablet (10 mg total) by mouth daily.  ? carvedilol (COREG) 3.125 MG tablet TAKE 1 TABLET BY MOUTH TWICE A DAY WITH A MEAL  ? Colchicine (MITIGARE) 0.6 MG CAPS Take 0.5 tablets by mouth daily as needed (for gout.  okay to fill with mitigare).  ? Dulaglutide (TRULICITY) 1.5 JK/0.9FG SOPN  Inject 3 mg into the skin once a week. (Patient not taking: Reported on 01/03/2022)  ? furosemide (LASIX) 40 MG tablet TAKE 1 TABLET BY MOUTH EVERY DAY  ? KLOR-CON M10 10 MEQ tablet TAKE 1 TABLET BY MOUTH EVERY DAY  ? Multiple Vitamins-Minerals (MULTIVITAMIN WITH MINERALS) tablet Take 1 tablet by mouth daily.  ? naloxone (NARCAN) nasal spray 4 mg/0.1 mL Spray nostril if needed for opioid overuse- decreased consciousness, decreased respirations  ? oxyCODONE-acetaminophen (PERCOCET) 10-325 MG tablet Take 1 tablet by mouth every 6 (six) hours as needed for pain (Fill on or after 02/22/2022).  ? oxyCODONE-acetaminophen (PERCOCET) 10-325 MG tablet Take 1 tablet by mouth every 6 (six) hours as needed for pain (Fill on or after 01/23/2022).  ? oxyCODONE-acetaminophen (PERCOCET) 10-325 MG tablet Take 1 tablet by mouth every 6 (six) hours as needed for pain (Fill on or after 12/24/2021).  ? vitamin C (ASCORBIC ACID) 500 MG tablet Take 500 mg by mouth daily.  ? ?No facility-administered encounter medications on file as of 01/03/2022.  ? ? ?Patient Active Problem List  ? Diagnosis Date Noted  ? Nonobstructive transaminitis 06/28/2021  ? Decubitus ulcer of posterior thigh, stage 2 ([place]) 06/28/2021  ? Acute kidney injury (resolved) 06/28/2021  ? Pre-diabetes 06/10/2021  ? Obesity, Class III, BMI 40-49.9 (morbid obesity) ([place]) 06/10/2021  ? Osteoarthritis 06/10/2021  ? Pressure injury of skin 06/09/2021  ? Respiratory failure ([place]) 06/01/2021  ? Acute diastolic CHF (congestive heart failure) ([place]) 05/31/2021  ? CHF (congestive heart failure) ([place]) 05/31/2021  ?  Hypoxia 05/31/2021  ? Gout, unspecified 03/10/2021  ? Edema 03/10/2021  ? Diabetes mellitus without complication ([place]) 29/92/4268  ? Muscle spasms of both lower extremities 07/29/2020  ? Bedbound 05/22/2020  ? Unilateral osteoarthritis of hip, right 12/27/2019  ? Severe obstructive sleep apnea 02/22/2018  ? Hypertriglyceridemia 08/24/2017  ? Low HDL (under 40) 08/24/2017  ?  Metabolic syndrome 34/19/6222  ? Morbid obesity ([place]) 10/14/2013  ? HTN (hypertension) 10/14/2013  ? ? ?Conditions to be addressed/monitored:CHF, HTN, Osteoarthritis, and Obesity ? ?Care Plan : RNCM plan of care  ?Updates made by Dannielle Karvonen, RN since 01/03/2022 12:00 AM  ?  ? ?Problem: Chronic disease management, education  and/ or care coordination needs   ?Priority: High  ?  ? ?Long-Range Goal: Development of plan of care to address chronic disease management and/ or care coodination needs   ?Start Date: 08/06/2021  ?Expected End Date: 03/18/2022  ?Priority: High  ?Note:   ?Current Barriers:  ?Knowledge Deficits related to plan of care for management of HTN and osteoarthritis / morbid obesity, heart failure  ?Chronic Disease Management support and education needs related to HTN and osteoarthritis / morbid obesity, heart failure ?Transportation barriers ?Mobility limitations due to: Osteoarthritis of hip/ chronic pain/ morbid obesity.  Patient is homebound and has not walked in 2 years.  ?Patient states, he is doing well.  He reports having a follow up visit with his primary care provider on 12/03/21.  He states he continues follow up with the nutritionist 1 time per month and physical therapy 2x per week.  Patient reports ongoing counseling 2 times per week. Patient states he is able to stand for approximately 2 minutes and can take a couple of steps.  He states he is pleased with is progress.  Patient states he was able to keep his dog at his currently residence.    ?RNCM Clinical Goal(s):  ?Patient will verbalize understanding of plan for management of HTN and osteoarthritis, morbid obesity, heart failure ?take all medications exactly as prescribed and will call provider for medication related questions ?attend all scheduled medical appointments:  ?continue to work with RN Care Manager to address care management and care coordination needs related to  HTN and osteoarthritis, morbid obesity, heart failure ?work  with community resource care guide to address needs related to  Transportation through collaboration with Consulting civil engineer, Education officer, museum, provider, and care team.  ? ?Interventions: ?1:1 collaboration with primary care provider regarding development and update of comprehensive plan of care as evidenced by provider attestation and co-signature ?Inter-disciplinary care team collaboration (see longitudinal plan of care) ?Evaluation of current treatment plan related to  self management and patient's adherence to plan as established by provider ? ?Osteoarthritis /Pain Interventions: Goal on track: Yes  Longterm goal ?Pain assessment performed ?Medications reviewed and compliance discussed  ?Reviewed provider established plan for pain management ?Discussed importance of adherence to all scheduled medical appointments; ?Counseled on the importance of reporting any/all new or changed pain symptoms or management strategies to pain management provider; ?Discussed use of relaxation techniques and/or diversional activities to assist with pain reduction (distraction, imagery, relaxation, massage, acupressure, TENS, heat, and cold application; ?Advised to continue to work with home health PT/ OT and perform home exercises as advised. ? ?Hypertension Interventions:  Goal on track: Yes Long term goal ?Last practice recorded BP readings:  ?BP Readings from Last 3 Encounters:  ?12/03/21 (!) 132/91  ?08/04/21 135/78  ?07/26/21 (!) 148/95  ?Most recent eGFR/CrCl:  ?Lab Results  ?  Component Value Date  ? EGFR 117 07/26/2021  ?  No components found for: CRCL ? ?Evaluation of current treatment plan related to hypertension self management and patient's adherence to plan as established by provider; ?Reviewed medications with patient and discussed importance of compliance; ?Discussed plans with patient for ongoing care management follow up and provided patient with direct contact information for care management team; ?Reviewed  scheduled/upcoming provider appointments ?Advised to continue to follow a low salt diet.  ? ?Obesity interventions: Goal on track:  Yes. Long term ?Evaluation of current treatment plan related to  obesity ,  self-management

## 2022-01-03 NOTE — Patient Instructions (Signed)
Visit Information ? ?Thank you for taking time to visit with me today. Please don't hesitate to contact me if I can be of assistance to you before our next scheduled telephone appointment. ? ?Following are the goals we discussed today:  ?Continue to take medications and refill as prescribed.   ?Attend all scheduled provider appointments  ?Call provider office for new concerns or questions  ?- call doctor for signs and symptoms of high blood pressure ?- keep all doctor appointments ?Follow diet plan provided to you by the nutritionist ?Follow a low salt diet as advised by your provider.  ?Continue to perform exercises provided to you by home physical therapist ?Apply hot or cold pack for pain relief to affected area ? ?Our next appointment is by telephone on 01/28/22 at 1:00 pm ? ?Please call the care guide team at [phone] if you need to cancel or reschedule your appointment.  ? ?If you are experiencing a Mental Health or Behavioral Health Crisis or need someone to talk to, please call the Suicide and Crisis Lifeline: 988 ?call 1-800-273-TALK (toll free, 24 hour hotline)  ? ?Patient verbalizes understanding of instructions and care plan provided today and agrees to view in MyChart. Active MyChart status confirmed with patient.   ? ?George Inaavina Cornelia Walraven RN,BSN,CCM ?RN Case Manager ?[facility]  ?[phone] ? ?

## 2022-01-04 DIAGNOSIS — I5031 Acute diastolic (congestive) heart failure: Secondary | ICD-10-CM | POA: Diagnosis not present

## 2022-01-04 DIAGNOSIS — D649 Anemia, unspecified: Secondary | ICD-10-CM | POA: Diagnosis not present

## 2022-01-04 DIAGNOSIS — E119 Type 2 diabetes mellitus without complications: Secondary | ICD-10-CM | POA: Diagnosis not present

## 2022-01-04 DIAGNOSIS — M1611 Unilateral primary osteoarthritis, right hip: Secondary | ICD-10-CM | POA: Diagnosis not present

## 2022-01-04 DIAGNOSIS — J9622 Acute and chronic respiratory failure with hypercapnia: Secondary | ICD-10-CM | POA: Diagnosis not present

## 2022-01-04 DIAGNOSIS — G4733 Obstructive sleep apnea (adult) (pediatric): Secondary | ICD-10-CM | POA: Diagnosis not present

## 2022-01-04 DIAGNOSIS — J9621 Acute and chronic respiratory failure with hypoxia: Secondary | ICD-10-CM | POA: Diagnosis not present

## 2022-01-04 DIAGNOSIS — I110 Hypertensive heart disease with heart failure: Secondary | ICD-10-CM | POA: Diagnosis not present

## 2022-01-04 DIAGNOSIS — M109 Gout, unspecified: Secondary | ICD-10-CM | POA: Diagnosis not present

## 2022-01-05 ENCOUNTER — Telehealth: Payer: Medicare HMO

## 2022-01-05 DIAGNOSIS — D649 Anemia, unspecified: Secondary | ICD-10-CM | POA: Diagnosis not present

## 2022-01-05 DIAGNOSIS — G4733 Obstructive sleep apnea (adult) (pediatric): Secondary | ICD-10-CM | POA: Diagnosis not present

## 2022-01-05 DIAGNOSIS — M109 Gout, unspecified: Secondary | ICD-10-CM | POA: Diagnosis not present

## 2022-01-05 DIAGNOSIS — E119 Type 2 diabetes mellitus without complications: Secondary | ICD-10-CM | POA: Diagnosis not present

## 2022-01-05 DIAGNOSIS — I110 Hypertensive heart disease with heart failure: Secondary | ICD-10-CM | POA: Diagnosis not present

## 2022-01-05 DIAGNOSIS — M1611 Unilateral primary osteoarthritis, right hip: Secondary | ICD-10-CM | POA: Diagnosis not present

## 2022-01-05 DIAGNOSIS — J9622 Acute and chronic respiratory failure with hypercapnia: Secondary | ICD-10-CM | POA: Diagnosis not present

## 2022-01-05 DIAGNOSIS — I5031 Acute diastolic (congestive) heart failure: Secondary | ICD-10-CM | POA: Diagnosis not present

## 2022-01-05 DIAGNOSIS — J9621 Acute and chronic respiratory failure with hypoxia: Secondary | ICD-10-CM | POA: Diagnosis not present

## 2022-01-06 DIAGNOSIS — D649 Anemia, unspecified: Secondary | ICD-10-CM | POA: Diagnosis not present

## 2022-01-06 DIAGNOSIS — G4733 Obstructive sleep apnea (adult) (pediatric): Secondary | ICD-10-CM | POA: Diagnosis not present

## 2022-01-06 DIAGNOSIS — E119 Type 2 diabetes mellitus without complications: Secondary | ICD-10-CM | POA: Diagnosis not present

## 2022-01-06 DIAGNOSIS — M109 Gout, unspecified: Secondary | ICD-10-CM | POA: Diagnosis not present

## 2022-01-06 DIAGNOSIS — I5031 Acute diastolic (congestive) heart failure: Secondary | ICD-10-CM | POA: Diagnosis not present

## 2022-01-06 DIAGNOSIS — J9621 Acute and chronic respiratory failure with hypoxia: Secondary | ICD-10-CM | POA: Diagnosis not present

## 2022-01-06 DIAGNOSIS — I110 Hypertensive heart disease with heart failure: Secondary | ICD-10-CM | POA: Diagnosis not present

## 2022-01-06 DIAGNOSIS — M1611 Unilateral primary osteoarthritis, right hip: Secondary | ICD-10-CM | POA: Diagnosis not present

## 2022-01-06 DIAGNOSIS — J9622 Acute and chronic respiratory failure with hypercapnia: Secondary | ICD-10-CM | POA: Diagnosis not present

## 2022-01-07 DIAGNOSIS — F331 Major depressive disorder, recurrent, moderate: Secondary | ICD-10-CM | POA: Diagnosis not present

## 2022-01-07 DIAGNOSIS — F419 Anxiety disorder, unspecified: Secondary | ICD-10-CM | POA: Diagnosis not present

## 2022-01-10 ENCOUNTER — Other Ambulatory Visit: Payer: Self-pay

## 2022-01-10 DIAGNOSIS — F331 Major depressive disorder, recurrent, moderate: Secondary | ICD-10-CM | POA: Diagnosis not present

## 2022-01-10 DIAGNOSIS — F419 Anxiety disorder, unspecified: Secondary | ICD-10-CM | POA: Diagnosis not present

## 2022-01-10 MED ORDER — TRULICITY 1.5 MG/0.5ML ~~LOC~~ SOAJ: 3.0000 mg | SUBCUTANEOUS | 2 refills | Status: DC

## 2022-01-10 MED ORDER — CARVEDILOL 3.125 MG PO TABS
ORAL_TABLET | ORAL | 2 refills | Status: DC
Start: 2022-01-10 — End: 2022-09-20

## 2022-01-10 MED ORDER — FUROSEMIDE 40 MG PO TABS: 40.0000 mg | ORAL_TABLET | Freq: Every day | ORAL | 1 refills | Status: DC

## 2022-01-10 MED ORDER — ALLOPURINOL 100 MG PO TABS
100.0000 mg | ORAL_TABLET | ORAL | 1 refills | Status: DC
Start: 2022-01-10 — End: 2023-01-26

## 2022-01-11 DIAGNOSIS — G4733 Obstructive sleep apnea (adult) (pediatric): Secondary | ICD-10-CM | POA: Diagnosis not present

## 2022-01-11 DIAGNOSIS — M109 Gout, unspecified: Secondary | ICD-10-CM | POA: Diagnosis not present

## 2022-01-11 DIAGNOSIS — J9622 Acute and chronic respiratory failure with hypercapnia: Secondary | ICD-10-CM | POA: Diagnosis not present

## 2022-01-11 DIAGNOSIS — D649 Anemia, unspecified: Secondary | ICD-10-CM | POA: Diagnosis not present

## 2022-01-11 DIAGNOSIS — I110 Hypertensive heart disease with heart failure: Secondary | ICD-10-CM | POA: Diagnosis not present

## 2022-01-11 DIAGNOSIS — E119 Type 2 diabetes mellitus without complications: Secondary | ICD-10-CM | POA: Diagnosis not present

## 2022-01-11 DIAGNOSIS — I5031 Acute diastolic (congestive) heart failure: Secondary | ICD-10-CM | POA: Diagnosis not present

## 2022-01-11 DIAGNOSIS — M1611 Unilateral primary osteoarthritis, right hip: Secondary | ICD-10-CM | POA: Diagnosis not present

## 2022-01-11 DIAGNOSIS — J9621 Acute and chronic respiratory failure with hypoxia: Secondary | ICD-10-CM | POA: Diagnosis not present

## 2022-01-12 DIAGNOSIS — D649 Anemia, unspecified: Secondary | ICD-10-CM | POA: Diagnosis not present

## 2022-01-12 DIAGNOSIS — E119 Type 2 diabetes mellitus without complications: Secondary | ICD-10-CM | POA: Diagnosis not present

## 2022-01-12 DIAGNOSIS — I110 Hypertensive heart disease with heart failure: Secondary | ICD-10-CM | POA: Diagnosis not present

## 2022-01-12 DIAGNOSIS — G4733 Obstructive sleep apnea (adult) (pediatric): Secondary | ICD-10-CM | POA: Diagnosis not present

## 2022-01-12 DIAGNOSIS — J9621 Acute and chronic respiratory failure with hypoxia: Secondary | ICD-10-CM | POA: Diagnosis not present

## 2022-01-12 DIAGNOSIS — J9622 Acute and chronic respiratory failure with hypercapnia: Secondary | ICD-10-CM | POA: Diagnosis not present

## 2022-01-12 DIAGNOSIS — I5031 Acute diastolic (congestive) heart failure: Secondary | ICD-10-CM | POA: Diagnosis not present

## 2022-01-12 DIAGNOSIS — M109 Gout, unspecified: Secondary | ICD-10-CM | POA: Diagnosis not present

## 2022-01-12 DIAGNOSIS — M1611 Unilateral primary osteoarthritis, right hip: Secondary | ICD-10-CM | POA: Diagnosis not present

## 2022-01-13 DIAGNOSIS — M1611 Unilateral primary osteoarthritis, right hip: Secondary | ICD-10-CM | POA: Diagnosis not present

## 2022-01-13 DIAGNOSIS — J9621 Acute and chronic respiratory failure with hypoxia: Secondary | ICD-10-CM | POA: Diagnosis not present

## 2022-01-13 DIAGNOSIS — M109 Gout, unspecified: Secondary | ICD-10-CM | POA: Diagnosis not present

## 2022-01-13 DIAGNOSIS — F419 Anxiety disorder, unspecified: Secondary | ICD-10-CM | POA: Diagnosis not present

## 2022-01-13 DIAGNOSIS — I110 Hypertensive heart disease with heart failure: Secondary | ICD-10-CM | POA: Diagnosis not present

## 2022-01-13 DIAGNOSIS — F331 Major depressive disorder, recurrent, moderate: Secondary | ICD-10-CM | POA: Diagnosis not present

## 2022-01-13 DIAGNOSIS — G4733 Obstructive sleep apnea (adult) (pediatric): Secondary | ICD-10-CM | POA: Diagnosis not present

## 2022-01-13 DIAGNOSIS — E119 Type 2 diabetes mellitus without complications: Secondary | ICD-10-CM | POA: Diagnosis not present

## 2022-01-13 DIAGNOSIS — D649 Anemia, unspecified: Secondary | ICD-10-CM | POA: Diagnosis not present

## 2022-01-13 DIAGNOSIS — I5031 Acute diastolic (congestive) heart failure: Secondary | ICD-10-CM | POA: Diagnosis not present

## 2022-01-13 DIAGNOSIS — J9622 Acute and chronic respiratory failure with hypercapnia: Secondary | ICD-10-CM | POA: Diagnosis not present

## 2022-01-15 ENCOUNTER — Telehealth: Payer: Self-pay

## 2022-01-15 NOTE — Telephone Encounter (Signed)
Received following fax from pharmacy please advise:  ? ? ?

## 2022-01-16 DIAGNOSIS — I110 Hypertensive heart disease with heart failure: Secondary | ICD-10-CM | POA: Diagnosis not present

## 2022-01-16 DIAGNOSIS — I509 Heart failure, unspecified: Secondary | ICD-10-CM

## 2022-01-16 DIAGNOSIS — M1990 Unspecified osteoarthritis, unspecified site: Secondary | ICD-10-CM

## 2022-01-16 MED ORDER — TRULICITY 3 MG/0.5ML ~~LOC~~ SOAJ: 3.0000 mg | SUBCUTANEOUS | 5 refills | Status: DC

## 2022-01-16 NOTE — Telephone Encounter (Signed)
Rx sent for 3mg .  Please update patient.   ?

## 2022-01-16 NOTE — Addendum Note (Signed)
Addended by: Joaquim Nam on: 01/16/2022 06:46 PM ? ? Modules accepted: Orders ? ?

## 2022-01-17 ENCOUNTER — Other Ambulatory Visit: Payer: Self-pay | Admitting: Family Medicine

## 2022-01-17 DIAGNOSIS — F419 Anxiety disorder, unspecified: Secondary | ICD-10-CM | POA: Diagnosis not present

## 2022-01-17 DIAGNOSIS — F331 Major depressive disorder, recurrent, moderate: Secondary | ICD-10-CM | POA: Diagnosis not present

## 2022-01-17 NOTE — Telephone Encounter (Signed)
Did PA in covermymeds and PA came back as not needed. I let pharmacy know and they still show PA is needed. Insurance will need to be called to see why. ?

## 2022-01-17 NOTE — Telephone Encounter (Signed)
Contacted the patient to discuss moving up appointment with CCM pharmacist. The patient states he is now getting mail delivery for his medications from [facility]  to his home. He is happy with this process at this time . ? ?[name], CPP notified ? ?Mariam Helbert, CCMA ?Health concierge  ?[phone]  ?

## 2022-01-18 DIAGNOSIS — J9621 Acute and chronic respiratory failure with hypoxia: Secondary | ICD-10-CM | POA: Diagnosis not present

## 2022-01-18 DIAGNOSIS — E119 Type 2 diabetes mellitus without complications: Secondary | ICD-10-CM | POA: Diagnosis not present

## 2022-01-18 DIAGNOSIS — J9622 Acute and chronic respiratory failure with hypercapnia: Secondary | ICD-10-CM | POA: Diagnosis not present

## 2022-01-18 DIAGNOSIS — I5031 Acute diastolic (congestive) heart failure: Secondary | ICD-10-CM | POA: Diagnosis not present

## 2022-01-18 DIAGNOSIS — I110 Hypertensive heart disease with heart failure: Secondary | ICD-10-CM | POA: Diagnosis not present

## 2022-01-18 DIAGNOSIS — M109 Gout, unspecified: Secondary | ICD-10-CM | POA: Diagnosis not present

## 2022-01-18 DIAGNOSIS — G4733 Obstructive sleep apnea (adult) (pediatric): Secondary | ICD-10-CM | POA: Diagnosis not present

## 2022-01-18 DIAGNOSIS — M1611 Unilateral primary osteoarthritis, right hip: Secondary | ICD-10-CM | POA: Diagnosis not present

## 2022-01-18 DIAGNOSIS — R32 Unspecified urinary incontinence: Secondary | ICD-10-CM | POA: Diagnosis not present

## 2022-01-18 DIAGNOSIS — D649 Anemia, unspecified: Secondary | ICD-10-CM | POA: Diagnosis not present

## 2022-01-18 NOTE — Telephone Encounter (Signed)
Called patients insurance and this medication is covered by them and no PA is needed.  ?

## 2022-01-19 ENCOUNTER — Telehealth: Payer: Self-pay

## 2022-01-19 DIAGNOSIS — J9622 Acute and chronic respiratory failure with hypercapnia: Secondary | ICD-10-CM | POA: Diagnosis not present

## 2022-01-19 DIAGNOSIS — E119 Type 2 diabetes mellitus without complications: Secondary | ICD-10-CM | POA: Diagnosis not present

## 2022-01-19 DIAGNOSIS — J9621 Acute and chronic respiratory failure with hypoxia: Secondary | ICD-10-CM | POA: Diagnosis not present

## 2022-01-19 DIAGNOSIS — G4733 Obstructive sleep apnea (adult) (pediatric): Secondary | ICD-10-CM | POA: Diagnosis not present

## 2022-01-19 DIAGNOSIS — D649 Anemia, unspecified: Secondary | ICD-10-CM | POA: Diagnosis not present

## 2022-01-19 DIAGNOSIS — M109 Gout, unspecified: Secondary | ICD-10-CM | POA: Diagnosis not present

## 2022-01-19 DIAGNOSIS — M1611 Unilateral primary osteoarthritis, right hip: Secondary | ICD-10-CM | POA: Diagnosis not present

## 2022-01-19 DIAGNOSIS — I110 Hypertensive heart disease with heart failure: Secondary | ICD-10-CM | POA: Diagnosis not present

## 2022-01-19 DIAGNOSIS — I5031 Acute diastolic (congestive) heart failure: Secondary | ICD-10-CM | POA: Diagnosis not present

## 2022-01-19 NOTE — Telephone Encounter (Signed)
Called and spoke with [facility] and looks like the last two rxs came from and went to [facility] [address] to fill. I spoke with [facility] about this rx yesterday and they were getting it ready for the patient. Called [facility] and notified them of all of this.  ?

## 2022-01-19 NOTE — Telephone Encounter (Signed)
[facility] Client ?>>>Contains Verbal Order - Signature Required<<< ?TELEPHONE ADVICE RECORD ?AccessNurse? ?Patient ?Name: ?Michael Payne ?IS ?Gender: Male ?DOB: 01/20/1971 ?Age: [age] Y 4 M ?Return ?Phone ?Number: ?[phone] ?(Primary) ?Address: ?City/ ?State/ ?Zip: [place] ? [phone] ?Client [facility] ?[facility] ?Provider Raechel Acheuncan, Shaw - MD ?Contact Type Call ?Who Is Calling Pharmacy ?Call Type Pharmacy Send to RN ?Chief Complaint Paging or Request for Consult ?Reason for Call Request to clarify medication order ?Initial Comment Caller states they are from [facility] ?and need clarification on prescription ?Pharmacy Name [facility] ?Pharmacist Name Elease Hashimotoatricia ?Pharmacy Number [phone] ?Translation No ?Nurse Assessment ?Nurse: Elray BubaHaurez, RN, Huntley DecSara Date/Time Lamount Cohen(Eastern Time): 01/18/2022 5:24:33 PM ?Please select the assessment type ---Pharmacy clarification ?Is there an on-call physician for the client? ---Yes ?Do the client directives allow paging the on call for ?medication concerns? ---No ?Additional Documentation ---RN will obtain NPI information and call the ?pharmacy back for order clarification ?Nurse: Elray BubaHaurez, RN, Huntley DecSara Date/Time Lamount Cohen(Eastern Time): 01/18/2022 5:48:13 PM ?Please select the assessment type ---Pharmacy clarification ?Additional Documentation ?---Pharmacy is requesting clarification on Trulicity ?strength and direction. Written for 1.5mg  pen but ?directions state to inject 3mg . Caller wants to clarify if ?they are to dispense 3mg  pen. ?Is there an on-call physician for the client? ---Yes ?Do the client directives allow paging the on call for ?medication concerns? ---Yes ?Disp. Time (Eastern ?Time) Disposition Final User ?01/18/2022 5:30:18 PM Pharmacy Call Elray BubaHaurez, RN, Huntley DecSara ?Reason: Called pharmacy for ?clarification. Pharmacy will need ?called back. ?PLEASE NOTE: All timestamps  contained within this report are represented as [place] Standard Time. ?CONFIDENTIALTY NOTICE: This fax transmission is intended only for the addressee. It contains information that is legally privileged, confidential or ?otherwise protected from use or disclosure. If you are not the intended recipient, you are strictly prohibited from reviewing, disclosing, copying using ?or disseminating any of this information or taking any action in reliance on or regarding this information. If you have received this fax in error, please ?notify us immediately by telephone so that we can arrange for its return to us. Phone: [phone], Toll-Free: [phone], Fax: [phone] ?Page: 2 of 3 ?Call Id: [id number] ?Disp. Time (Eastern ?Time) Disposition Final User ?01/18/2022 5:32:33 PM Send To RN Personal Elray BubaHaurez, RN, Sara ?01/18/2022 5:33:48 PM Attempt made - line busy Elray BubaHaurez, RN, Sara ?01/18/2022 5:52:49 PM [facility], RN, Huntley DecSara ?01/18/2022 5:53:56 PM Pharmacy Call Elray BubaHaurez, RN, Huntley DecSara ?Reason: Spoke with Melissa at the ?pharmacy for order clarification. ?01/18/2022 5:54:06 PM [facility], Huntley DecSara ?01/18/2022 6:29:40 PM [facility], RN, Huntley DecSara ?01/18/2022 6:43:06 PM Pharmacy Call Elray BubaHaurez, RN, Huntley DecSara ?Reason: Spoke with pharmacist to ?clarify order. ?01/18/2022 5:33:43 PM Clinical Call Yes Elray BubaHaurez, RN, Huntley DecSara ?Verbal Orders/Maintenance Medications ?Medication Refill Route Dosage Regime Duration Admin Instructions User Name ?Trulicity pen Subcutaneous 3mg  1 Months inject 3mg  Subq once ?weekly ?Elray BubaHaurez, RN, ?Huntley DecSara ?Paging ?DoctorName Phone DateTime Result/ ?Outcome Message Type Notes ?Sanda LingerJones, Thomas - MD [phone] ?01/18/2022 ?5:52:49 ?PM ?Paged On Call ?Back to Call ?Center ?Doctor Paged Huntley DecSara, RN Access nurse ?[phone] ?Sanda LingerJones, Thomas - MD [phone] ?01/18/2022 ?6:29:40 ?PM ?Paged On Call ?Back to Call ?Center ?Doctor Paged ?Sanda LingerJones, Thomas - MD ?01/18/2022 ?6:35:32 ?PM ?Spoke  with On ?Call - General Message Result On call provider reports it is o ?

## 2022-01-19 NOTE — Telephone Encounter (Signed)
Noted. Thanks.

## 2022-01-20 ENCOUNTER — Encounter: Payer: Medicare HMO | Attending: Surgery | Admitting: Skilled Nursing Facility1

## 2022-01-20 ENCOUNTER — Other Ambulatory Visit: Payer: Self-pay | Admitting: Family Medicine

## 2022-01-20 DIAGNOSIS — I110 Hypertensive heart disease with heart failure: Secondary | ICD-10-CM | POA: Diagnosis not present

## 2022-01-20 DIAGNOSIS — I5031 Acute diastolic (congestive) heart failure: Secondary | ICD-10-CM | POA: Diagnosis not present

## 2022-01-20 DIAGNOSIS — J9622 Acute and chronic respiratory failure with hypercapnia: Secondary | ICD-10-CM | POA: Diagnosis not present

## 2022-01-20 DIAGNOSIS — M109 Gout, unspecified: Secondary | ICD-10-CM | POA: Diagnosis not present

## 2022-01-20 DIAGNOSIS — E119 Type 2 diabetes mellitus without complications: Secondary | ICD-10-CM | POA: Insufficient documentation

## 2022-01-20 DIAGNOSIS — J9621 Acute and chronic respiratory failure with hypoxia: Secondary | ICD-10-CM | POA: Diagnosis not present

## 2022-01-20 DIAGNOSIS — D649 Anemia, unspecified: Secondary | ICD-10-CM | POA: Diagnosis not present

## 2022-01-20 DIAGNOSIS — M1611 Unilateral primary osteoarthritis, right hip: Secondary | ICD-10-CM

## 2022-01-20 DIAGNOSIS — F331 Major depressive disorder, recurrent, moderate: Secondary | ICD-10-CM | POA: Diagnosis not present

## 2022-01-20 DIAGNOSIS — F419 Anxiety disorder, unspecified: Secondary | ICD-10-CM | POA: Diagnosis not present

## 2022-01-20 DIAGNOSIS — G4733 Obstructive sleep apnea (adult) (pediatric): Secondary | ICD-10-CM | POA: Diagnosis not present

## 2022-01-20 NOTE — Telephone Encounter (Signed)
Duplicate request. I cannot deny 

## 2022-01-20 NOTE — Progress Notes (Signed)
Supervised Weight Loss Visit ?Bariatric Nutrition Education ? ?Sleeve gastrectomy  ? ?5 out of 6 SWL Appointments  ? ?Virtual appointment; Pt agreeable to limitations of this visit type; Pt identified by name and date of birth. ? ? ?NUTRITION ASSESSMENT ? ?Anthropometrics  ?Start weight at [facility]: Pt is bedbound ?Weight: pt is bedbound ? ?Clinical  ?Medical hx: Chf, Respiratory Failure, Diabetes, osteoarthritis, OSA ?Medications: allopurinol, Trulicity, furosemide, oxycodone, amlodpine, potassium-chloride, tylenol, vitamin c, multi  ?Labs: A1C 6.5 ?Notable signs/symptoms:  ?Any previous deficiencies? No ? ?Lifestyle & Dietary Hx ? ?Pt logged onto virtual appointment while sitting up, stating he is sitting up for his meals now. ?Pt states he can stand on his own when no one is around, but states he is fearful of taking steps on his own.  Pt states he has taken steps during PT.  ?Pt states he is working with physical therapist to get into a wheelchair, but states he is anxious about getting into the wheelchair. ? ? ?Dietitian advised pt if PT felt he was ready it would be great to see him in person with the use of a wheel chair, pt states that seems too difficult at this point.  ? ? ?Estimated daily fluid intake: 126 oz (8 bottles of water) ?Supplements:  ?Current average weekly physical activity: bedbound/PT; 2 times a week and OT once a week ? ?24-Hr Dietary Recall: 3 times a day fruit; sometimes snacks in between 1-2 ?First Meal: Yogurt with granola and berries, chicken wrap ?Snack: carrots and celery with peanut butter ?Second Meal: Salad with with chicken, bell peppers craisins, fat free sf balsamic vinaigrette. ?Snack: fruit or vegan fig bar or 3-4 graham crackers + peanut butter ?Third Meal: baked chicken thigh sauteed veggies with unsalted butter, 1/2 cup of rice or pizza (cauliflower crust, ground [place]) ?Snack: no sugar added pudding sugar free brownie with sugar free whipped cream ?Beverages: water, herbal  tea occasionally ? ?Estimated Energy Needs ?Calories: 2200 ? ? ?NUTRITION DIAGNOSIS  ?Overweight/obesity ([place]-3.3) related to past poor dietary habits and physical inactivity as evidenced by patient w/ planned sleeve gastrectomy surgery following dietary guidelines for continued weight loss. ? ? ?NUTRITION INTERVENTION  ?Nutrition counseling (C-1) and education (E-2) to facilitate bariatric surgery goals. ? ?Pre-Op Goals Progress & New Goals ?Great up the great work! ?Continue: Be sitting up for our next appt ?Continue: drink on the fluid throughout the day instead of chugging at once ?Re-engage/Continue: aim for stopping one bite before that feeling of fullness, looking for satifaction ? ?Handouts Provided Include  ? ? ?Learning Style & Readiness for Change ?Teaching method utilized: Visual & Auditory  ?Demonstrated degree of understanding via: Teach Back  ?Readiness Level: action ?Barriers to learning/adherence to lifestyle change: none; his daughter is providing his meals which is following a healthy diet ? ?RD's Notes for next Visit  ?Assess pts adherence to chosen goals ? ? ?MONITORING & EVALUATION ?Dietary intake, weekly physical activity, body weight, and pre-op goals in 1 month.  ? ?Next Steps  ?Patient is to return to [facility] in 1 month ?

## 2022-01-20 NOTE — Telephone Encounter (Signed)
Refill request for oxyCODONE-acetaminophen (PERCOCET) 10-325 MG tablet ? ?LOV - 12/03/21 ?Next OV - not scheduled ?Last refill - 12/05/21 #120/0 x 3 ? ?

## 2022-01-23 DIAGNOSIS — J9610 Chronic respiratory failure, unspecified whether with hypoxia or hypercapnia: Secondary | ICD-10-CM | POA: Diagnosis not present

## 2022-01-23 NOTE — Telephone Encounter (Signed)
Please verify with pharmacy.  He should have refills on file.   ?

## 2022-01-23 NOTE — Telephone Encounter (Signed)
See other phone note

## 2022-01-24 DIAGNOSIS — F419 Anxiety disorder, unspecified: Secondary | ICD-10-CM | POA: Diagnosis not present

## 2022-01-24 DIAGNOSIS — F331 Major depressive disorder, recurrent, moderate: Secondary | ICD-10-CM | POA: Diagnosis not present

## 2022-01-24 NOTE — Progress Notes (Signed)
Please check with patient about getting his refill.  I didn't realize that he needed this prior to Sunday and I couldn't complete all of the incoming mychart messages before end of business on Friday.  I apologize.  His next refill is on 02/22/22 and that is a Tuesday.   ?

## 2022-01-25 DIAGNOSIS — M109 Gout, unspecified: Secondary | ICD-10-CM | POA: Diagnosis not present

## 2022-01-25 DIAGNOSIS — J9621 Acute and chronic respiratory failure with hypoxia: Secondary | ICD-10-CM | POA: Diagnosis not present

## 2022-01-25 DIAGNOSIS — E119 Type 2 diabetes mellitus without complications: Secondary | ICD-10-CM | POA: Diagnosis not present

## 2022-01-25 DIAGNOSIS — G4733 Obstructive sleep apnea (adult) (pediatric): Secondary | ICD-10-CM | POA: Diagnosis not present

## 2022-01-25 DIAGNOSIS — I5031 Acute diastolic (congestive) heart failure: Secondary | ICD-10-CM | POA: Diagnosis not present

## 2022-01-25 DIAGNOSIS — D649 Anemia, unspecified: Secondary | ICD-10-CM | POA: Diagnosis not present

## 2022-01-25 DIAGNOSIS — I110 Hypertensive heart disease with heart failure: Secondary | ICD-10-CM | POA: Diagnosis not present

## 2022-01-25 DIAGNOSIS — J9622 Acute and chronic respiratory failure with hypercapnia: Secondary | ICD-10-CM | POA: Diagnosis not present

## 2022-01-25 DIAGNOSIS — M1611 Unilateral primary osteoarthritis, right hip: Secondary | ICD-10-CM | POA: Diagnosis not present

## 2022-01-26 DIAGNOSIS — I5031 Acute diastolic (congestive) heart failure: Secondary | ICD-10-CM | POA: Diagnosis not present

## 2022-01-26 DIAGNOSIS — D649 Anemia, unspecified: Secondary | ICD-10-CM | POA: Diagnosis not present

## 2022-01-26 DIAGNOSIS — J9621 Acute and chronic respiratory failure with hypoxia: Secondary | ICD-10-CM | POA: Diagnosis not present

## 2022-01-26 DIAGNOSIS — M109 Gout, unspecified: Secondary | ICD-10-CM | POA: Diagnosis not present

## 2022-01-26 DIAGNOSIS — M1611 Unilateral primary osteoarthritis, right hip: Secondary | ICD-10-CM | POA: Diagnosis not present

## 2022-01-26 DIAGNOSIS — G4733 Obstructive sleep apnea (adult) (pediatric): Secondary | ICD-10-CM | POA: Diagnosis not present

## 2022-01-26 DIAGNOSIS — J9622 Acute and chronic respiratory failure with hypercapnia: Secondary | ICD-10-CM | POA: Diagnosis not present

## 2022-01-26 DIAGNOSIS — E119 Type 2 diabetes mellitus without complications: Secondary | ICD-10-CM | POA: Diagnosis not present

## 2022-01-26 DIAGNOSIS — I110 Hypertensive heart disease with heart failure: Secondary | ICD-10-CM | POA: Diagnosis not present

## 2022-01-27 DIAGNOSIS — G4733 Obstructive sleep apnea (adult) (pediatric): Secondary | ICD-10-CM | POA: Diagnosis not present

## 2022-01-27 DIAGNOSIS — M109 Gout, unspecified: Secondary | ICD-10-CM | POA: Diagnosis not present

## 2022-01-27 DIAGNOSIS — M1611 Unilateral primary osteoarthritis, right hip: Secondary | ICD-10-CM | POA: Diagnosis not present

## 2022-01-27 DIAGNOSIS — E119 Type 2 diabetes mellitus without complications: Secondary | ICD-10-CM | POA: Diagnosis not present

## 2022-01-27 DIAGNOSIS — F331 Major depressive disorder, recurrent, moderate: Secondary | ICD-10-CM | POA: Diagnosis not present

## 2022-01-27 DIAGNOSIS — J9621 Acute and chronic respiratory failure with hypoxia: Secondary | ICD-10-CM | POA: Diagnosis not present

## 2022-01-27 DIAGNOSIS — I110 Hypertensive heart disease with heart failure: Secondary | ICD-10-CM | POA: Diagnosis not present

## 2022-01-27 DIAGNOSIS — J9622 Acute and chronic respiratory failure with hypercapnia: Secondary | ICD-10-CM | POA: Diagnosis not present

## 2022-01-27 DIAGNOSIS — I5031 Acute diastolic (congestive) heart failure: Secondary | ICD-10-CM | POA: Diagnosis not present

## 2022-01-27 DIAGNOSIS — F419 Anxiety disorder, unspecified: Secondary | ICD-10-CM | POA: Diagnosis not present

## 2022-01-27 DIAGNOSIS — D649 Anemia, unspecified: Secondary | ICD-10-CM | POA: Diagnosis not present

## 2022-01-28 ENCOUNTER — Telehealth: Payer: Medicare HMO

## 2022-01-31 DIAGNOSIS — F331 Major depressive disorder, recurrent, moderate: Secondary | ICD-10-CM | POA: Diagnosis not present

## 2022-01-31 DIAGNOSIS — F419 Anxiety disorder, unspecified: Secondary | ICD-10-CM | POA: Diagnosis not present

## 2022-02-01 DIAGNOSIS — I110 Hypertensive heart disease with heart failure: Secondary | ICD-10-CM | POA: Diagnosis not present

## 2022-02-01 DIAGNOSIS — D649 Anemia, unspecified: Secondary | ICD-10-CM | POA: Diagnosis not present

## 2022-02-01 DIAGNOSIS — E119 Type 2 diabetes mellitus without complications: Secondary | ICD-10-CM | POA: Diagnosis not present

## 2022-02-01 DIAGNOSIS — G4733 Obstructive sleep apnea (adult) (pediatric): Secondary | ICD-10-CM | POA: Diagnosis not present

## 2022-02-01 DIAGNOSIS — J9622 Acute and chronic respiratory failure with hypercapnia: Secondary | ICD-10-CM | POA: Diagnosis not present

## 2022-02-01 DIAGNOSIS — I5031 Acute diastolic (congestive) heart failure: Secondary | ICD-10-CM | POA: Diagnosis not present

## 2022-02-01 DIAGNOSIS — M109 Gout, unspecified: Secondary | ICD-10-CM | POA: Diagnosis not present

## 2022-02-01 DIAGNOSIS — J9621 Acute and chronic respiratory failure with hypoxia: Secondary | ICD-10-CM | POA: Diagnosis not present

## 2022-02-01 DIAGNOSIS — M1611 Unilateral primary osteoarthritis, right hip: Secondary | ICD-10-CM | POA: Diagnosis not present

## 2022-02-03 DIAGNOSIS — M109 Gout, unspecified: Secondary | ICD-10-CM | POA: Diagnosis not present

## 2022-02-03 DIAGNOSIS — D649 Anemia, unspecified: Secondary | ICD-10-CM | POA: Diagnosis not present

## 2022-02-03 DIAGNOSIS — F419 Anxiety disorder, unspecified: Secondary | ICD-10-CM | POA: Diagnosis not present

## 2022-02-03 DIAGNOSIS — J9622 Acute and chronic respiratory failure with hypercapnia: Secondary | ICD-10-CM | POA: Diagnosis not present

## 2022-02-03 DIAGNOSIS — G4733 Obstructive sleep apnea (adult) (pediatric): Secondary | ICD-10-CM | POA: Diagnosis not present

## 2022-02-03 DIAGNOSIS — F331 Major depressive disorder, recurrent, moderate: Secondary | ICD-10-CM | POA: Diagnosis not present

## 2022-02-03 DIAGNOSIS — I5031 Acute diastolic (congestive) heart failure: Secondary | ICD-10-CM | POA: Diagnosis not present

## 2022-02-03 DIAGNOSIS — M1611 Unilateral primary osteoarthritis, right hip: Secondary | ICD-10-CM | POA: Diagnosis not present

## 2022-02-03 DIAGNOSIS — E119 Type 2 diabetes mellitus without complications: Secondary | ICD-10-CM | POA: Diagnosis not present

## 2022-02-03 DIAGNOSIS — I110 Hypertensive heart disease with heart failure: Secondary | ICD-10-CM | POA: Diagnosis not present

## 2022-02-03 DIAGNOSIS — J9621 Acute and chronic respiratory failure with hypoxia: Secondary | ICD-10-CM | POA: Diagnosis not present

## 2022-02-07 DIAGNOSIS — F331 Major depressive disorder, recurrent, moderate: Secondary | ICD-10-CM | POA: Diagnosis not present

## 2022-02-07 DIAGNOSIS — F419 Anxiety disorder, unspecified: Secondary | ICD-10-CM | POA: Diagnosis not present

## 2022-02-08 DIAGNOSIS — G4733 Obstructive sleep apnea (adult) (pediatric): Secondary | ICD-10-CM | POA: Diagnosis not present

## 2022-02-08 DIAGNOSIS — I5031 Acute diastolic (congestive) heart failure: Secondary | ICD-10-CM | POA: Diagnosis not present

## 2022-02-08 DIAGNOSIS — J9621 Acute and chronic respiratory failure with hypoxia: Secondary | ICD-10-CM | POA: Diagnosis not present

## 2022-02-08 DIAGNOSIS — I110 Hypertensive heart disease with heart failure: Secondary | ICD-10-CM | POA: Diagnosis not present

## 2022-02-08 DIAGNOSIS — E119 Type 2 diabetes mellitus without complications: Secondary | ICD-10-CM | POA: Diagnosis not present

## 2022-02-08 DIAGNOSIS — M1611 Unilateral primary osteoarthritis, right hip: Secondary | ICD-10-CM | POA: Diagnosis not present

## 2022-02-08 DIAGNOSIS — M109 Gout, unspecified: Secondary | ICD-10-CM | POA: Diagnosis not present

## 2022-02-08 DIAGNOSIS — J9622 Acute and chronic respiratory failure with hypercapnia: Secondary | ICD-10-CM | POA: Diagnosis not present

## 2022-02-08 DIAGNOSIS — D649 Anemia, unspecified: Secondary | ICD-10-CM | POA: Diagnosis not present

## 2022-02-10 DIAGNOSIS — I5031 Acute diastolic (congestive) heart failure: Secondary | ICD-10-CM | POA: Diagnosis not present

## 2022-02-10 DIAGNOSIS — G4733 Obstructive sleep apnea (adult) (pediatric): Secondary | ICD-10-CM | POA: Diagnosis not present

## 2022-02-10 DIAGNOSIS — F331 Major depressive disorder, recurrent, moderate: Secondary | ICD-10-CM | POA: Diagnosis not present

## 2022-02-10 DIAGNOSIS — J9622 Acute and chronic respiratory failure with hypercapnia: Secondary | ICD-10-CM | POA: Diagnosis not present

## 2022-02-10 DIAGNOSIS — M109 Gout, unspecified: Secondary | ICD-10-CM | POA: Diagnosis not present

## 2022-02-10 DIAGNOSIS — D649 Anemia, unspecified: Secondary | ICD-10-CM | POA: Diagnosis not present

## 2022-02-10 DIAGNOSIS — J9621 Acute and chronic respiratory failure with hypoxia: Secondary | ICD-10-CM | POA: Diagnosis not present

## 2022-02-10 DIAGNOSIS — I110 Hypertensive heart disease with heart failure: Secondary | ICD-10-CM | POA: Diagnosis not present

## 2022-02-10 DIAGNOSIS — F419 Anxiety disorder, unspecified: Secondary | ICD-10-CM | POA: Diagnosis not present

## 2022-02-10 DIAGNOSIS — M1611 Unilateral primary osteoarthritis, right hip: Secondary | ICD-10-CM | POA: Diagnosis not present

## 2022-02-10 DIAGNOSIS — E119 Type 2 diabetes mellitus without complications: Secondary | ICD-10-CM | POA: Diagnosis not present

## 2022-02-12 DIAGNOSIS — J9621 Acute and chronic respiratory failure with hypoxia: Secondary | ICD-10-CM | POA: Diagnosis not present

## 2022-02-12 DIAGNOSIS — M1611 Unilateral primary osteoarthritis, right hip: Secondary | ICD-10-CM | POA: Diagnosis not present

## 2022-02-12 DIAGNOSIS — I110 Hypertensive heart disease with heart failure: Secondary | ICD-10-CM | POA: Diagnosis not present

## 2022-02-12 DIAGNOSIS — G4733 Obstructive sleep apnea (adult) (pediatric): Secondary | ICD-10-CM | POA: Diagnosis not present

## 2022-02-12 DIAGNOSIS — E119 Type 2 diabetes mellitus without complications: Secondary | ICD-10-CM | POA: Diagnosis not present

## 2022-02-12 DIAGNOSIS — I5031 Acute diastolic (congestive) heart failure: Secondary | ICD-10-CM | POA: Diagnosis not present

## 2022-02-12 DIAGNOSIS — D649 Anemia, unspecified: Secondary | ICD-10-CM | POA: Diagnosis not present

## 2022-02-12 DIAGNOSIS — J9622 Acute and chronic respiratory failure with hypercapnia: Secondary | ICD-10-CM | POA: Diagnosis not present

## 2022-02-12 DIAGNOSIS — M109 Gout, unspecified: Secondary | ICD-10-CM | POA: Diagnosis not present

## 2022-02-15 DIAGNOSIS — I5031 Acute diastolic (congestive) heart failure: Secondary | ICD-10-CM | POA: Diagnosis not present

## 2022-02-15 DIAGNOSIS — M1611 Unilateral primary osteoarthritis, right hip: Secondary | ICD-10-CM | POA: Diagnosis not present

## 2022-02-15 DIAGNOSIS — G4733 Obstructive sleep apnea (adult) (pediatric): Secondary | ICD-10-CM | POA: Diagnosis not present

## 2022-02-15 DIAGNOSIS — D649 Anemia, unspecified: Secondary | ICD-10-CM | POA: Diagnosis not present

## 2022-02-15 DIAGNOSIS — J9621 Acute and chronic respiratory failure with hypoxia: Secondary | ICD-10-CM | POA: Diagnosis not present

## 2022-02-15 DIAGNOSIS — E119 Type 2 diabetes mellitus without complications: Secondary | ICD-10-CM | POA: Diagnosis not present

## 2022-02-15 DIAGNOSIS — M109 Gout, unspecified: Secondary | ICD-10-CM | POA: Diagnosis not present

## 2022-02-15 DIAGNOSIS — J9622 Acute and chronic respiratory failure with hypercapnia: Secondary | ICD-10-CM | POA: Diagnosis not present

## 2022-02-15 DIAGNOSIS — I110 Hypertensive heart disease with heart failure: Secondary | ICD-10-CM | POA: Diagnosis not present

## 2022-02-17 ENCOUNTER — Telehealth: Payer: Self-pay

## 2022-02-17 DIAGNOSIS — I5031 Acute diastolic (congestive) heart failure: Secondary | ICD-10-CM | POA: Diagnosis not present

## 2022-02-17 DIAGNOSIS — M109 Gout, unspecified: Secondary | ICD-10-CM | POA: Diagnosis not present

## 2022-02-17 DIAGNOSIS — J9622 Acute and chronic respiratory failure with hypercapnia: Secondary | ICD-10-CM | POA: Diagnosis not present

## 2022-02-17 DIAGNOSIS — M1611 Unilateral primary osteoarthritis, right hip: Secondary | ICD-10-CM | POA: Diagnosis not present

## 2022-02-17 DIAGNOSIS — I110 Hypertensive heart disease with heart failure: Secondary | ICD-10-CM | POA: Diagnosis not present

## 2022-02-17 DIAGNOSIS — R32 Unspecified urinary incontinence: Secondary | ICD-10-CM | POA: Diagnosis not present

## 2022-02-17 DIAGNOSIS — E119 Type 2 diabetes mellitus without complications: Secondary | ICD-10-CM | POA: Diagnosis not present

## 2022-02-17 DIAGNOSIS — D649 Anemia, unspecified: Secondary | ICD-10-CM | POA: Diagnosis not present

## 2022-02-17 DIAGNOSIS — G4733 Obstructive sleep apnea (adult) (pediatric): Secondary | ICD-10-CM | POA: Diagnosis not present

## 2022-02-17 DIAGNOSIS — J9621 Acute and chronic respiratory failure with hypoxia: Secondary | ICD-10-CM | POA: Diagnosis not present

## 2022-02-17 NOTE — Telephone Encounter (Signed)
noted 

## 2022-02-17 NOTE — Telephone Encounter (Signed)
Noted. Thanks.

## 2022-02-17 NOTE — Telephone Encounter (Signed)
I spoke with pt; pt said he has a stye on inside corner of rt eye that started on 02/13/22. Pt said hurting pain level of 6 - 7. Pt has taken tylenol for pain that has helped. Pt said some clear drainage coming from stye. Rt eye started swelling on 02/16/22 but pt said he can see out of his rt eye and no vision changes. Pt is bed ridden and has already scheduled an video visit appt with Audria Nine NP on 02/18/22  at 2:40. UC & ED precautions given and pt voiced understanding. Also told pt to try warm moist compresses on rt eye for 15 min intervals 3 - 4 times today to help with pain and swelling. Pt voiced understanding and appreciative. Sending note to Audria Nine NP and FYI to Dr Para March who requested triage.

## 2022-02-18 ENCOUNTER — Telehealth (INDEPENDENT_AMBULATORY_CARE_PROVIDER_SITE_OTHER): Payer: Medicare HMO | Admitting: Nurse Practitioner

## 2022-02-18 DIAGNOSIS — H00013 Hordeolum externum right eye, unspecified eyelid: Secondary | ICD-10-CM | POA: Diagnosis not present

## 2022-02-18 MED ORDER — ERYTHROMYCIN 5 MG/GM OP OINT: 1.0000 "application " | TOPICAL_OINTMENT | Freq: Three times a day (TID) | OPHTHALMIC | 0 refills | Status: DC

## 2022-02-18 NOTE — Progress Notes (Signed)
Patient ID: Michael Payne, male    DOB: 02/16/1971, 51 y.o.   MRN: [id number]  Virtual visit completed through [employer], a video enabled telemedicine application. Due to national recommendations of social distancing due to COVID-19, a virtual visit is felt to be most appropriate for this patient at this time. Reviewed limitations, risks, security and privacy concerns of performing a virtual visit and the availability of in person appointments. I also reviewed that there may be a patient responsible charge related to this service. The patient agreed to proceed.   Patient location: home Provider location: [place] [facility] Persons participating in this virtual visit: patient, provider   If any vitals were documented, they were collected by patient at home unless specified below.    There were no vitals taken for this visit.   CC: Eye problems Subjective:   HPI: Michael Payne is a 51 y.o. male presenting on 02/18/2022 for Stye (In the corner of the right eye, noticed on 02/14/22. Pain, itching present, some discharge this morning. No fever)    States that he noticed approx 5 days. States that it is his right eye in the corner of his eye. States that he noticed a knot under the eyelid   States during the week he had some nausea and diarrhea. By Thursday he noticed pain and swelling in the eye. States last night he work up around 4 am hurting and swollen. States that he did do warm compresses. Noticed  yellow and clear discharge. States some itching and pain.  Patient denies any changes in vision and is moving his eyes well.  Patient does wear corrective lenses but does not wear contacts    Relevant past medical, surgical, family and social history reviewed and updated as indicated. Interim medical history since our last visit reviewed. Allergies and medications reviewed and updated. Outpatient Medications Prior to Visit  Medication Sig Dispense Refill   acetaminophen  (TYLENOL) 325 MG tablet Take 2 tablets (650 mg total) by mouth every 6 (six) hours as needed for headache or mild pain.     allopurinol (ZYLOPRIM) 100 MG tablet Take 1 tablet (100 mg total) by mouth every other day. 90 tablet 1   amLODipine (NORVASC) 10 MG tablet Take 1 tablet (10 mg total) by mouth daily. 90 tablet 1   carvedilol (COREG) 3.125 MG tablet TAKE 1 TABLET BY MOUTH TWICE A DAY WITH A MEAL 180 tablet 2   Colchicine (MITIGARE) 0.6 MG CAPS Take 0.5 tablets by mouth daily as needed (for gout.  okay to fill with mitigare). 30 capsule 3   Dulaglutide (TRULICITY) 3 MG/0.5ML SOPN INJECT 3 MG AS DIRECTED ONCE A WEEK 2 mL 5   furosemide (LASIX) 40 MG tablet Take 1 tablet (40 mg total) by mouth daily. 90 tablet 1   KLOR-CON M10 10 MEQ tablet TAKE 1 TABLET BY MOUTH EVERY DAY 90 tablet 2   Multiple Vitamins-Minerals (MULTIVITAMIN WITH MINERALS) tablet Take 1 tablet by mouth daily.     naloxone (NARCAN) nasal spray 4 mg/0.1 mL Spray nostril if needed for opioid overuse- decreased consciousness, decreased respirations 2 each 1   oxyCODONE-acetaminophen (PERCOCET) 10-325 MG tablet Take 1 tablet by mouth every 6 (six) hours as needed for pain (Fill on or after 02/22/2022). 120 tablet 0   oxyCODONE-acetaminophen (PERCOCET) 10-325 MG tablet Take 1 tablet by mouth every 6 (six) hours as needed for pain (Fill on or after 12/24/2021). 120 tablet 0   vitamin C (  ASCORBIC ACID) 500 MG tablet Take 500 mg by mouth daily.     oxyCODONE-acetaminophen (PERCOCET) 10-325 MG tablet Take 1 tablet by mouth every 6 (six) hours as needed for pain (Fill on or after 01/23/2022). 120 tablet 0   No facility-administered medications prior to visit.     Per HPI unless specifically indicated in ROS section below Review of Systems  Constitutional:  Negative for fever.  Eyes:  Positive for pain, discharge and itching. Negative for photophobia and visual disturbance.  Objective:  There were no vitals taken for this visit.  Wt  Readings from Last 3 Encounters:  06/18/21 (!) 595 lb 3.9 oz (270 kg)  11/05/20 (!) 490 lb (222.3 kg)  09/02/20 (!) 490 lb (222.3 kg)       Physical exam: Gen: alert, NAD, not ill appearing Pulm: speaks in complete sentences without increased work of breathing Psych: normal mood, normal thought content      Results for orders placed or performed in visit on 12/24/21  HM DIABETES EYE EXAM  Result Value Ref Range   HM Diabetic Eye Exam No Retinopathy No Retinopathy   Assessment & Plan:   Problem List Items Addressed This Visit       Other   Hordeolum externum of right eye - Primary    Per patient report sounds like a hordeolum of the right eye.  Patient has been using warm compresses continue doing this such.  We will send erythromycin 0.5% ointment 3 times daily for 7 days patient does wear glasses but does not wear contacts.  Gave strict precautions when to seek emergent healthcare in regards to eye trouble and/or pain.  Follow-up if no improvement       Relevant Medications   erythromycin ophthalmic ointment     Meds ordered this encounter  Medications   erythromycin ophthalmic ointment    Sig: Place 1 application. into the left eye 3 (three) times daily. For one week.    Dispense:  3.5 g    Refill:  0    Order Specific Question:   Supervising Provider    Answer:   TOWER, MARNE A [[id number]]   No orders of the defined types were placed in this encounter.   I discussed the assessment and treatment plan with the patient. The patient was provided an opportunity to ask questions and all were answered. The patient agreed with the plan and demonstrated an understanding of the instructions. The patient was advised to call back or seek an in-person evaluation if the symptoms worsen or if the condition fails to improve as anticipated.  Follow up plan: No follow-ups on file.  Audria Nine, NP

## 2022-02-18 NOTE — Assessment & Plan Note (Signed)
Per patient report sounds like a hordeolum of the right eye.  Patient has been using warm compresses continue doing this such.  We will send erythromycin 0.5% ointment 3 times daily for 7 days patient does wear glasses but does not wear contacts.  Gave strict precautions when to seek emergent healthcare in regards to eye trouble and/or pain.  Follow-up if no improvement

## 2022-02-19 DIAGNOSIS — G4733 Obstructive sleep apnea (adult) (pediatric): Secondary | ICD-10-CM | POA: Diagnosis not present

## 2022-02-19 DIAGNOSIS — I5031 Acute diastolic (congestive) heart failure: Secondary | ICD-10-CM | POA: Diagnosis not present

## 2022-02-19 DIAGNOSIS — M1611 Unilateral primary osteoarthritis, right hip: Secondary | ICD-10-CM | POA: Diagnosis not present

## 2022-02-19 DIAGNOSIS — J9622 Acute and chronic respiratory failure with hypercapnia: Secondary | ICD-10-CM | POA: Diagnosis not present

## 2022-02-19 DIAGNOSIS — D649 Anemia, unspecified: Secondary | ICD-10-CM | POA: Diagnosis not present

## 2022-02-19 DIAGNOSIS — E119 Type 2 diabetes mellitus without complications: Secondary | ICD-10-CM | POA: Diagnosis not present

## 2022-02-19 DIAGNOSIS — J9621 Acute and chronic respiratory failure with hypoxia: Secondary | ICD-10-CM | POA: Diagnosis not present

## 2022-02-19 DIAGNOSIS — I110 Hypertensive heart disease with heart failure: Secondary | ICD-10-CM | POA: Diagnosis not present

## 2022-02-19 DIAGNOSIS — M109 Gout, unspecified: Secondary | ICD-10-CM | POA: Diagnosis not present

## 2022-02-21 ENCOUNTER — Encounter: Payer: Medicare HMO | Attending: Family Medicine | Admitting: Skilled Nursing Facility1

## 2022-02-21 ENCOUNTER — Ambulatory Visit: Payer: Medicare HMO | Admitting: Skilled Nursing Facility1

## 2022-02-21 DIAGNOSIS — E119 Type 2 diabetes mellitus without complications: Secondary | ICD-10-CM | POA: Diagnosis not present

## 2022-02-21 DIAGNOSIS — F331 Major depressive disorder, recurrent, moderate: Secondary | ICD-10-CM | POA: Diagnosis not present

## 2022-02-21 DIAGNOSIS — F419 Anxiety disorder, unspecified: Secondary | ICD-10-CM | POA: Diagnosis not present

## 2022-02-21 NOTE — Progress Notes (Signed)
Supervised Weight Loss Visit Bariatric Nutrition Education  Sleeve gastrectomy   6 out of 6 SWL Appointments   Virtual appointment; Pt agreeable to limitations of this visit type; Pt identified by name and date of birth.  Pt completed visits.   Pt has cleared nutrition requirements.    NUTRITION ASSESSMENT  Anthropometrics  Start weight at [facility]: Pt is bedbound Weight: pt is bedbound  Clinical  Medical hx: CHF, Respiratory Failure, Diabetes, osteoarthritis, OSA Medications: allopurinol, Trulicity, furosemide, oxycodone, amlodpine, potassium-chloride, tylenol, vitamin c, multi  Labs: A1C 6.5 Notable signs/symptoms:  Any previous deficiencies? No  Lifestyle & Dietary Hx  Pt logged onto virtual appointment laying in bed, Dietitian asked if he as able to sit up for the appt; pt states he forgot he had the appt and was already sitting up with breakfast so does not feel he is able to sit up for this appt.   Pt states he has been taking steps but not working on getting in the wheelchair due to a desire to walk instead of need a wheelchair and has been working on controled sitting. Pt states he can feel his strength increasing.  Pt states he does not want to work on using a wheelchair he wants to be able to walk. Pt states he does not have a ramp so he cannot use a wheelchair anyway.  Pt states he does not have a timeline to leave the house for appointments and is currently focusing on just taking more steps overall.   Pt states his daughter continues to help him with meals with the plan to help him until July or August of this year stating he was hoping to have surgery this July.   Pt states his main take aways have been to slow down eating, portion control and stopping at fullness, not drinking with meals, and not eating sweets and carbs as often and avoiding fried foods.   Pt states he thinks he will have to focus in on getting more active after surgery.   Pt states he is excited  to get surgery in July.  Dietitian advised pt she was unsure of his timeline for surgery due to his current mobility status; pt states, "I watch a lot of Dr. Now and not one time have they been told they could not have surgery because they cannot walk." Dietitian reiterated an emphasis on not knowing WHEN he was having surgery not that he could not have surgery altogether.  Pt states he was supposed to have hip surgery a long time ago but needed to lose weight first.   Pt states if he could walk why would he need surgery? He could just go right to the gym and not need surgery. Dietitian advised pt the program clearance flow is for his health and safety and to mitigate any undue risk for his wellbeing.    Estimated daily fluid intake: 126 oz (8 bottles of water) Supplements:  Current average weekly physical activity: bedbound/PT; 2 times a week and OT once a week  24-Hr Dietary Recall: 3 times a day fruit; sometimes snacks in between 1-2 First Meal: oatmeal + unsalted butter + cranberries + 3 sausage [place] links or potato bell peppers and onion and ground [place] meat on low carb wrap Snack: carrots and celery with peanut butter or chips Second Meal: [place] meat and bell peppers and cheese and sour cream burrito Snack: fruit or vegan fig bar or 3-4 graham crackers + peanut butter Third Meal: shrimp +  chicken thighs + cauliflower rice + corn Snack: no sugar added pudding sugar free brownie with sugar free whipped cream Beverages: water, herbal tea occasionally  Estimated Energy Needs Calories: 2200   NUTRITION DIAGNOSIS  Overweight/obesity ([place]-3.3) related to past poor dietary habits and physical inactivity as evidenced by patient w/ planned sleeve gastrectomy surgery following dietary guidelines for continued weight loss.   NUTRITION INTERVENTION  Nutrition counseling (C-1) and education (E-2) to facilitate bariatric surgery goals.  Pre-Op Goals Progress & New Goals Great keep up the  great work! Continue: Be sitting up for our next appt Re-engage/Continue: drink on the fluid throughout the day instead of chugging at once Re-engage/Continue: aim for stopping one bite before that feeling of fullness, looking for satisfaction NEW: avoid simple carbohydrates   Handouts Provided Include    Learning Style & Readiness for Change Teaching method utilized: Visual & Auditory  Demonstrated degree of understanding via: Teach Back  Readiness Level: action Barriers to learning/adherence to lifestyle change: none; his daughter is providing his meals which is following a healthy diet    MONITORING & EVALUATION Dietary intake, weekly physical activity, body weight, and pre-op goals   Next Steps  Patient is to return to [facility] for next appt Pt has completed visits. No further supervised visits required/recomended

## 2022-02-22 ENCOUNTER — Telehealth: Payer: Self-pay | Admitting: Family Medicine

## 2022-02-22 ENCOUNTER — Telehealth: Payer: Self-pay

## 2022-02-22 DIAGNOSIS — M1611 Unilateral primary osteoarthritis, right hip: Secondary | ICD-10-CM | POA: Diagnosis not present

## 2022-02-22 DIAGNOSIS — M109 Gout, unspecified: Secondary | ICD-10-CM | POA: Diagnosis not present

## 2022-02-22 DIAGNOSIS — I110 Hypertensive heart disease with heart failure: Secondary | ICD-10-CM | POA: Diagnosis not present

## 2022-02-22 DIAGNOSIS — E119 Type 2 diabetes mellitus without complications: Secondary | ICD-10-CM | POA: Diagnosis not present

## 2022-02-22 DIAGNOSIS — D649 Anemia, unspecified: Secondary | ICD-10-CM | POA: Diagnosis not present

## 2022-02-22 DIAGNOSIS — G4733 Obstructive sleep apnea (adult) (pediatric): Secondary | ICD-10-CM | POA: Diagnosis not present

## 2022-02-22 DIAGNOSIS — J9621 Acute and chronic respiratory failure with hypoxia: Secondary | ICD-10-CM | POA: Diagnosis not present

## 2022-02-22 DIAGNOSIS — I5031 Acute diastolic (congestive) heart failure: Secondary | ICD-10-CM | POA: Diagnosis not present

## 2022-02-22 DIAGNOSIS — J9622 Acute and chronic respiratory failure with hypercapnia: Secondary | ICD-10-CM | POA: Diagnosis not present

## 2022-02-22 NOTE — Telephone Encounter (Signed)
Caller Name: Gayleen OremWillie Payne Call back phone #: [phone]  MEDICATION(S): oxyCODONE-acetaminophen (PERCOCET) 10-325 MG tablet   Days of Med Remaining: a few days  Has the patient contacted their pharmacy (YES/NO)?  Yes, controlled but also the pharmacy where he normally gets it filled is out of the medication  IF YES, when and what did the pharmacy advise?  IF NO, request that the patient contact the pharmacy for the refills in the future.             The pharmacy will send an electronic request (except for controlled medications).  [facility]: [facility] on [address], He called and said they are in stock  ~~~Please advise patient/caregiver to allow 2-3 business days to process RX refills.

## 2022-02-22 NOTE — Telephone Encounter (Signed)
LOV - 02/18/22 VV w/ Audria Nine, NP NOV - NA RF - 12/05/21 #120/0 x 3   [facility]acy: [facility] on [address], He called and said they are in stock. Patient [facility] for [facility] to [facility] but they are out of medication

## 2022-02-22 NOTE — Telephone Encounter (Signed)
Home Health verbal orders  Agency Name: [facility]   Requesting OT/PT/Skilled nursing/Social Work/Speech:PT   Frequency: twice a week for 9 weeks.   COMMENTS: Secure VM, okay to leave message.   Please forward to [facility] pool or providers CMA

## 2022-02-23 ENCOUNTER — Other Ambulatory Visit: Payer: Self-pay | Admitting: Family Medicine

## 2022-02-23 DIAGNOSIS — I110 Hypertensive heart disease with heart failure: Secondary | ICD-10-CM | POA: Diagnosis not present

## 2022-02-23 DIAGNOSIS — G4733 Obstructive sleep apnea (adult) (pediatric): Secondary | ICD-10-CM | POA: Diagnosis not present

## 2022-02-23 DIAGNOSIS — E119 Type 2 diabetes mellitus without complications: Secondary | ICD-10-CM | POA: Diagnosis not present

## 2022-02-23 DIAGNOSIS — J9621 Acute and chronic respiratory failure with hypoxia: Secondary | ICD-10-CM | POA: Diagnosis not present

## 2022-02-23 DIAGNOSIS — D649 Anemia, unspecified: Secondary | ICD-10-CM | POA: Diagnosis not present

## 2022-02-23 DIAGNOSIS — M1611 Unilateral primary osteoarthritis, right hip: Secondary | ICD-10-CM | POA: Diagnosis not present

## 2022-02-23 DIAGNOSIS — J9622 Acute and chronic respiratory failure with hypercapnia: Secondary | ICD-10-CM | POA: Diagnosis not present

## 2022-02-23 DIAGNOSIS — I5031 Acute diastolic (congestive) heart failure: Secondary | ICD-10-CM | POA: Diagnosis not present

## 2022-02-23 DIAGNOSIS — M109 Gout, unspecified: Secondary | ICD-10-CM | POA: Diagnosis not present

## 2022-02-23 DIAGNOSIS — J9610 Chronic respiratory failure, unspecified whether with hypoxia or hypercapnia: Secondary | ICD-10-CM | POA: Diagnosis not present

## 2022-02-23 MED ORDER — OXYCODONE-ACETAMINOPHEN 10-325 MG PO TABS: 1.0000 | ORAL_TABLET | Freq: Four times a day (QID) | ORAL | 0 refills | Status: DC | PRN

## 2022-02-23 NOTE — Telephone Encounter (Signed)
Sent. Thanks.   

## 2022-02-23 NOTE — Telephone Encounter (Signed)
Name of Medication: oxycodone apap  10-325 mg Name of Pharmacy: [facility] or Written Date and Quantity: #120 on 12/05/21 Last Office Visit and Type: 02/18/22 acute and 12/03/21 FU Next Office Visit and Type: none scheduled Last Controlled Substance Agreement Date: none seen Last [id number] seen  Sending note to Dr Damita Dunnings who is out of office today.

## 2022-02-23 NOTE — Telephone Encounter (Signed)
See phone note.  Sent to [employer].

## 2022-02-23 NOTE — Telephone Encounter (Signed)
Please give the order.  Thanks.   

## 2022-02-24 DIAGNOSIS — G4733 Obstructive sleep apnea (adult) (pediatric): Secondary | ICD-10-CM | POA: Diagnosis not present

## 2022-02-24 DIAGNOSIS — J9621 Acute and chronic respiratory failure with hypoxia: Secondary | ICD-10-CM | POA: Diagnosis not present

## 2022-02-24 DIAGNOSIS — D649 Anemia, unspecified: Secondary | ICD-10-CM | POA: Diagnosis not present

## 2022-02-24 DIAGNOSIS — I5031 Acute diastolic (congestive) heart failure: Secondary | ICD-10-CM | POA: Diagnosis not present

## 2022-02-24 DIAGNOSIS — E119 Type 2 diabetes mellitus without complications: Secondary | ICD-10-CM | POA: Diagnosis not present

## 2022-02-24 DIAGNOSIS — J9622 Acute and chronic respiratory failure with hypercapnia: Secondary | ICD-10-CM | POA: Diagnosis not present

## 2022-02-24 DIAGNOSIS — F331 Major depressive disorder, recurrent, moderate: Secondary | ICD-10-CM | POA: Diagnosis not present

## 2022-02-24 DIAGNOSIS — M1611 Unilateral primary osteoarthritis, right hip: Secondary | ICD-10-CM | POA: Diagnosis not present

## 2022-02-24 DIAGNOSIS — F419 Anxiety disorder, unspecified: Secondary | ICD-10-CM | POA: Diagnosis not present

## 2022-02-24 DIAGNOSIS — I110 Hypertensive heart disease with heart failure: Secondary | ICD-10-CM | POA: Diagnosis not present

## 2022-02-24 DIAGNOSIS — M109 Gout, unspecified: Secondary | ICD-10-CM | POA: Diagnosis not present

## 2022-02-24 MED ORDER — OXYCODONE-ACETAMINOPHEN 10-325 MG PO TABS: 1.0000 | ORAL_TABLET | Freq: Four times a day (QID) | ORAL | 0 refills | Status: DC | PRN

## 2022-02-24 NOTE — Telephone Encounter (Signed)
Left detailed message for [name] with verbal orders

## 2022-02-24 NOTE — Telephone Encounter (Signed)
Patient advised.

## 2022-02-24 NOTE — Addendum Note (Signed)
Addended by: Joaquim Nam on: 02/24/2022 05:02 PM   Modules accepted: Orders

## 2022-02-24 NOTE — Telephone Encounter (Signed)
Sent. Thanks.   

## 2022-02-24 NOTE — Telephone Encounter (Signed)
Patient called in stating that the pharmacy does not have the medication in stock, asking for [place] to send to [facility] Address: [address] [place], [place] [address].

## 2022-02-26 DIAGNOSIS — G4733 Obstructive sleep apnea (adult) (pediatric): Secondary | ICD-10-CM | POA: Diagnosis not present

## 2022-02-26 DIAGNOSIS — I5031 Acute diastolic (congestive) heart failure: Secondary | ICD-10-CM | POA: Diagnosis not present

## 2022-02-26 DIAGNOSIS — M109 Gout, unspecified: Secondary | ICD-10-CM | POA: Diagnosis not present

## 2022-02-26 DIAGNOSIS — I110 Hypertensive heart disease with heart failure: Secondary | ICD-10-CM | POA: Diagnosis not present

## 2022-02-26 DIAGNOSIS — E119 Type 2 diabetes mellitus without complications: Secondary | ICD-10-CM | POA: Diagnosis not present

## 2022-02-26 DIAGNOSIS — M1611 Unilateral primary osteoarthritis, right hip: Secondary | ICD-10-CM | POA: Diagnosis not present

## 2022-02-26 DIAGNOSIS — J9621 Acute and chronic respiratory failure with hypoxia: Secondary | ICD-10-CM | POA: Diagnosis not present

## 2022-02-26 DIAGNOSIS — D649 Anemia, unspecified: Secondary | ICD-10-CM | POA: Diagnosis not present

## 2022-02-26 DIAGNOSIS — J9622 Acute and chronic respiratory failure with hypercapnia: Secondary | ICD-10-CM | POA: Diagnosis not present

## 2022-02-28 DIAGNOSIS — F419 Anxiety disorder, unspecified: Secondary | ICD-10-CM | POA: Diagnosis not present

## 2022-02-28 DIAGNOSIS — F331 Major depressive disorder, recurrent, moderate: Secondary | ICD-10-CM | POA: Diagnosis not present

## 2022-03-01 DIAGNOSIS — E119 Type 2 diabetes mellitus without complications: Secondary | ICD-10-CM | POA: Diagnosis not present

## 2022-03-01 DIAGNOSIS — M1611 Unilateral primary osteoarthritis, right hip: Secondary | ICD-10-CM | POA: Diagnosis not present

## 2022-03-01 DIAGNOSIS — I110 Hypertensive heart disease with heart failure: Secondary | ICD-10-CM | POA: Diagnosis not present

## 2022-03-01 DIAGNOSIS — J9621 Acute and chronic respiratory failure with hypoxia: Secondary | ICD-10-CM | POA: Diagnosis not present

## 2022-03-01 DIAGNOSIS — J9622 Acute and chronic respiratory failure with hypercapnia: Secondary | ICD-10-CM | POA: Diagnosis not present

## 2022-03-01 DIAGNOSIS — I5031 Acute diastolic (congestive) heart failure: Secondary | ICD-10-CM | POA: Diagnosis not present

## 2022-03-01 DIAGNOSIS — D649 Anemia, unspecified: Secondary | ICD-10-CM | POA: Diagnosis not present

## 2022-03-01 DIAGNOSIS — G4733 Obstructive sleep apnea (adult) (pediatric): Secondary | ICD-10-CM | POA: Diagnosis not present

## 2022-03-01 DIAGNOSIS — M109 Gout, unspecified: Secondary | ICD-10-CM | POA: Diagnosis not present

## 2022-03-03 DIAGNOSIS — E119 Type 2 diabetes mellitus without complications: Secondary | ICD-10-CM | POA: Diagnosis not present

## 2022-03-03 DIAGNOSIS — J9621 Acute and chronic respiratory failure with hypoxia: Secondary | ICD-10-CM | POA: Diagnosis not present

## 2022-03-03 DIAGNOSIS — F419 Anxiety disorder, unspecified: Secondary | ICD-10-CM | POA: Diagnosis not present

## 2022-03-03 DIAGNOSIS — D649 Anemia, unspecified: Secondary | ICD-10-CM | POA: Diagnosis not present

## 2022-03-03 DIAGNOSIS — J9622 Acute and chronic respiratory failure with hypercapnia: Secondary | ICD-10-CM | POA: Diagnosis not present

## 2022-03-03 DIAGNOSIS — M1611 Unilateral primary osteoarthritis, right hip: Secondary | ICD-10-CM | POA: Diagnosis not present

## 2022-03-03 DIAGNOSIS — I5031 Acute diastolic (congestive) heart failure: Secondary | ICD-10-CM | POA: Diagnosis not present

## 2022-03-03 DIAGNOSIS — I110 Hypertensive heart disease with heart failure: Secondary | ICD-10-CM | POA: Diagnosis not present

## 2022-03-03 DIAGNOSIS — M109 Gout, unspecified: Secondary | ICD-10-CM | POA: Diagnosis not present

## 2022-03-03 DIAGNOSIS — G4733 Obstructive sleep apnea (adult) (pediatric): Secondary | ICD-10-CM | POA: Diagnosis not present

## 2022-03-03 DIAGNOSIS — F331 Major depressive disorder, recurrent, moderate: Secondary | ICD-10-CM | POA: Diagnosis not present

## 2022-03-07 ENCOUNTER — Ambulatory Visit (INDEPENDENT_AMBULATORY_CARE_PROVIDER_SITE_OTHER): Payer: Medicare HMO

## 2022-03-07 ENCOUNTER — Telehealth: Payer: Self-pay

## 2022-03-07 DIAGNOSIS — I10 Essential (primary) hypertension: Secondary | ICD-10-CM

## 2022-03-07 DIAGNOSIS — M160 Bilateral primary osteoarthritis of hip: Secondary | ICD-10-CM

## 2022-03-07 DIAGNOSIS — I5032 Chronic diastolic (congestive) heart failure: Secondary | ICD-10-CM

## 2022-03-07 DIAGNOSIS — F419 Anxiety disorder, unspecified: Secondary | ICD-10-CM | POA: Diagnosis not present

## 2022-03-07 DIAGNOSIS — F331 Major depressive disorder, recurrent, moderate: Secondary | ICD-10-CM | POA: Diagnosis not present

## 2022-03-07 NOTE — Chronic Care Management (AMB) (Signed)
Chronic Care Management   CCM RN Visit Note  03/07/2022 Name: Michael Payne MRN: [id number] DOB: [birth year]  Subjective: Michael Payne is a 51 y.o. year old male who is a primary care patient of [name], MD. The care management team was consulted for assistance with disease management and care coordination needs.    Engaged with patient by telephone for follow up visit in response to provider referral for case management and/or care coordination services.   Consent to Services:  The patient was given information about Chronic Care Management services, agreed to services, and gave verbal consent prior to initiation of services.  Please see initial visit note for detailed documentation.   Patient agreed to services and verbal consent obtained.   Assessment: Review of patient past medical history, allergies, medications, health status, including review of consultants reports, laboratory and other test data, was performed as part of comprehensive evaluation and provision of chronic care management services.   SDOH (Social Determinants of Health) assessments and interventions performed:    CCM Care Plan  Allergies  Allergen Reactions   Aspirin Other (See Comments)    Upset stomach   Lactose Intolerance (Gi) Nausea And Vomiting   Lisinopril     Causes cough    Outpatient Encounter Medications as of 03/07/2022  Medication Sig   acetaminophen (TYLENOL) 325 MG tablet Take 2 tablets (650 mg total) by mouth every 6 (six) hours as needed for headache or mild pain.   allopurinol (ZYLOPRIM) 100 MG tablet Take 1 tablet (100 mg total) by mouth every other day.   amLODipine (NORVASC) 10 MG tablet Take 1 tablet (10 mg total) by mouth daily.   carvedilol (COREG) 3.125 MG tablet TAKE 1 TABLET BY MOUTH TWICE A DAY WITH A MEAL   Colchicine (MITIGARE) 0.6 MG CAPS Take 0.5 tablets by mouth daily as needed (for gout.  okay to fill with mitigare).   Dulaglutide (TRULICITY) 3 QQ/2.2LN SOPN INJECT  3 MG AS DIRECTED ONCE A WEEK   erythromycin ophthalmic ointment Place 1 application. into the left eye 3 (three) times daily. For one week.   furosemide (LASIX) 40 MG tablet Take 1 tablet (40 mg total) by mouth daily.   KLOR-CON M10 10 MEQ tablet TAKE 1 TABLET BY MOUTH EVERY DAY   Multiple Vitamins-Minerals (MULTIVITAMIN WITH MINERALS) tablet Take 1 tablet by mouth daily.   naloxone (NARCAN) nasal spray 4 mg/0.1 mL Spray nostril if needed for opioid overuse- decreased consciousness, decreased respirations   oxyCODONE-acetaminophen (PERCOCET) 10-325 MG tablet Take 1 tablet by mouth every 6 (six) hours as needed for pain.   vitamin C (ASCORBIC ACID) 500 MG tablet Take 500 mg by mouth daily.   No facility-administered encounter medications on file as of 03/07/2022.    Patient Active Problem List   Diagnosis Date Noted   Hordeolum externum of right eye 02/18/2022   Nonobstructive transaminitis 06/28/2021   Decubitus ulcer of posterior thigh, stage 2 ([place]) 06/28/2021   Acute kidney injury (resolved) 06/28/2021   Pre-diabetes 06/10/2021   Obesity, Class III, BMI 40-49.9 (morbid obesity) ([place]) 06/10/2021   Osteoarthritis 06/10/2021   Pressure injury of skin 06/09/2021   Respiratory failure ([place]) 98/92/1194   Acute diastolic CHF (congestive heart failure) ([place]) 05/31/2021   CHF (congestive heart failure) ([place]) 05/31/2021   Hypoxia 05/31/2021   Gout, unspecified 03/10/2021   Edema 03/10/2021   Diabetes mellitus without complication ([place]) 17/40/8144   Muscle spasms of both lower extremities 07/29/2020   Bedbound 05/22/2020  Unilateral osteoarthritis of hip, right 12/27/2019   Severe obstructive sleep apnea 02/22/2018   Hypertriglyceridemia 08/24/2017   Low HDL (under 40) 29/56/2130   Metabolic syndrome 86/57/8469   Morbid obesity ([place]) 10/14/2013   HTN (hypertension) 10/14/2013    Conditions to be addressed/monitored:CHF, HTN, Osteoarthritis, and Obesity  Care Plan : [facility] plan of care   Updates made by Dannielle Karvonen, RN since 03/07/2022 12:00 AM     Problem: Chronic disease management, education  and/ or care coordination needs   Priority: High     Long-Range Goal: Development of plan of care to address chronic disease management and/ or care coodination needs   Start Date: 08/06/2021  Expected End Date: 04/18/2022  Priority: High  Note:   Current Barriers:  Knowledge Deficits related to plan of care for management of HTN and osteoarthritis / morbid obesity, heart failure  Chronic Disease Management support and education needs related to HTN and osteoarthritis / morbid obesity, heart failure Transportation barriers Mobility limitations due to: Osteoarthritis of hip/ chronic pain/ morbid obesity.  Patient is homebound and has not walked in 2 years.  Patient states, he is doing well.  He states he continues to have physical therapy  2 days per week.  He states he continues to make strides now walking 8-9 steps and 5-6 backwards and sitting on side of the bed at least 45 min to 1 hr.  Patient reports pain level today is 7-8.  Patient states he continues to adhere to his diet plan.  He states he has completed his 6 month nutrition counseling.  He states he attempted to contact the bariatric clinic but has not heard back.  Patient states he continues to monitor is blood pressure.  Reports blood pressure ranges from 120-130's/70-80.  Denies any heart failure symptoms.   RNCM Clinical Goal(s):  Patient will verbalize understanding of plan for management of HTN and osteoarthritis, morbid obesity, heart failure take all medications exactly as prescribed and will call provider for medication related questions attend all scheduled medical appointments:  continue to work with RN Care Manager to address care management and care coordination needs related to  HTN and osteoarthritis, morbid obesity, heart failure work with community resource care guide to address needs related to   Transportation through collaboration with Consulting civil engineer, Education officer, museum, provider, and care team.   Interventions: 1:1 collaboration with primary care provider regarding development and update of comprehensive plan of care as evidenced by provider attestation and co-signature Inter-disciplinary care team collaboration (see longitudinal plan of care) Evaluation of current treatment plan related to  self management and patient's adherence to plan as established by provider  Osteoarthritis /Pain Interventions: Goal on track: Yes  Longterm goal Pain assessment performed Medications reviewed and compliance discussed  Reviewed provider established plan for pain management Discussed importance of adherence to all scheduled medical appointments; Counseled on the importance of reporting any/all new or changed pain symptoms or management strategies to pain management provider; Discussed use of relaxation techniques and/or diversional activities to assist with pain reduction (distraction, imagery, relaxation, massage, acupressure, TENS, heat, and cold application; Advised to continue to work with home health PTand perform home exercises as advised.  Hypertension Interventions:  Goal on track: Yes Long term goal Last practice recorded BP readings:  BP Readings from Last 3 Encounters:  12/03/21 (!) 132/91  08/04/21 135/78  07/26/21 (!) 148/95  Most recent eGFR/CrCl:  Lab Results  Component Value Date   EGFR 117 07/26/2021  No components found for: CRCL  Evaluation of current treatment plan related to hypertension self management and patient's adherence to plan as established by provider; Reviewed medications with patient and discussed importance of compliance; Discussed plans with patient for ongoing care management follow up and provided patient with direct contact information for care management team; Reviewed scheduled/upcoming provider appointments Advised to continue to follow a low salt  diet.  Advised to continue to monitor blood pressure and record  Obesity interventions: Goal on track:  Yes. Long term Evaluation of current treatment plan related to  obesity ,  self-management and patient's adherence to plan as established by provider. Discussed plans with patient for ongoing care management follow up and provided patient with direct contact information for care management team Reviewed medications with patient and discussed compliance Reviewed scheduled/upcoming provider appointments  Advised patient to continue daily exercises routine Advised patient to attempt telephone outreach to bariatric center  Heart Failure Interventions: Goal on track: Yes Patient advised to continue following low sodium diet; Discussed the importance of keeping all appointments with provider; Advised to continue to use CPAP/ bipap as advised by doctor Advised to take medications as prescribed.  Discussed plans with patient for ongoing care management follow up and provided patient with direct contact information for care management team Reviewed medications with patient and discussed compliance  Patient Goals/Self-Care Activities: Continue to take medications and refill as prescribed.   Attend all scheduled provider appointments  Call provider office for new concerns or questions  - call doctor for signs and symptoms of high blood pressure - keep all doctor appointments Follow diet plan provided to you by the nutritionist Follow a low salt diet as advised by your provider.  Continue to perform exercises provided to you by home physical therapist Apply hot or cold pack for pain relief to affected area Attempt call to bariatric office for plan of treatment update          Plan:The patient has been provided with contact information for the care management team and has been advised to call with any health related questions or concerns.  The care management team will reach out to the  patient again over the next 2 months days. Quinn Plowman RN,BSN,CCM RN Case Manager [place]  [phone]

## 2022-03-07 NOTE — Telephone Encounter (Signed)
Received a call from April at [facility]. The patient is bed bound currently and is needing blood work for an upcoming surgery, April is wondering if there is a way to have a nurse go out to his house and draw the blood.

## 2022-03-07 NOTE — Patient Instructions (Signed)
Visit Information  Thank you for taking time to visit with me today. Please don't hesitate to contact me if I can be of assistance to you before our next scheduled telephone appointment.  Following are the goals we discussed today:  Continue to take medications and refill as prescribed.   Attend all scheduled provider appointments  Call provider office for new concerns or questions  - call doctor for signs and symptoms of high blood pressure - keep all doctor appointments Follow diet plan provided to you by the nutritionist Follow a low salt diet as advised by your provider.  Continue to perform exercises provided to you by home physical therapist Apply hot or cold pack for pain relief to affected area Attempt call to bariatric office for plan of treatment update   Our next appointment is by telephone on 04/22/22 at 1:00 pm  Please call the care guide team at [phone] if you need to cancel or reschedule your appointment.   If you are experiencing a Mental Health or Behavioral Health Crisis or need someone to talk to, please call the Suicide and Crisis Lifeline: 988 call 1-800-273-TALK (toll free, 24 hour hotline)   Patient verbalizes understanding of instructions and care plan provided today and agrees to view in MyChart. Active MyChart status and patient understanding of how to access instructions and care plan via MyChart confirmed with patient.     George Ina RN,BSN,CCM RN Case Manager [facility]  [phone]

## 2022-03-07 NOTE — Telephone Encounter (Signed)
Error

## 2022-03-08 DIAGNOSIS — D649 Anemia, unspecified: Secondary | ICD-10-CM | POA: Diagnosis not present

## 2022-03-08 DIAGNOSIS — G4733 Obstructive sleep apnea (adult) (pediatric): Secondary | ICD-10-CM | POA: Diagnosis not present

## 2022-03-08 DIAGNOSIS — I110 Hypertensive heart disease with heart failure: Secondary | ICD-10-CM | POA: Diagnosis not present

## 2022-03-08 DIAGNOSIS — E119 Type 2 diabetes mellitus without complications: Secondary | ICD-10-CM | POA: Diagnosis not present

## 2022-03-08 DIAGNOSIS — J9621 Acute and chronic respiratory failure with hypoxia: Secondary | ICD-10-CM | POA: Diagnosis not present

## 2022-03-08 DIAGNOSIS — M1611 Unilateral primary osteoarthritis, right hip: Secondary | ICD-10-CM | POA: Diagnosis not present

## 2022-03-08 DIAGNOSIS — I5031 Acute diastolic (congestive) heart failure: Secondary | ICD-10-CM | POA: Diagnosis not present

## 2022-03-08 DIAGNOSIS — J9622 Acute and chronic respiratory failure with hypercapnia: Secondary | ICD-10-CM | POA: Diagnosis not present

## 2022-03-08 DIAGNOSIS — M109 Gout, unspecified: Secondary | ICD-10-CM | POA: Diagnosis not present

## 2022-03-08 NOTE — Telephone Encounter (Signed)
Yes, a HH referral is needed to have blood drawn at the home.

## 2022-03-08 NOTE — Telephone Encounter (Signed)
Can this be arranged through home health?  Does he need a new home health referral?  Please let me know.  Thanks.

## 2022-03-09 ENCOUNTER — Ambulatory Visit: Payer: Self-pay | Admitting: *Deleted

## 2022-03-09 DIAGNOSIS — I10 Essential (primary) hypertension: Secondary | ICD-10-CM

## 2022-03-09 DIAGNOSIS — J9621 Acute and chronic respiratory failure with hypoxia: Secondary | ICD-10-CM | POA: Diagnosis not present

## 2022-03-09 DIAGNOSIS — J9622 Acute and chronic respiratory failure with hypercapnia: Secondary | ICD-10-CM | POA: Diagnosis not present

## 2022-03-09 DIAGNOSIS — M1611 Unilateral primary osteoarthritis, right hip: Secondary | ICD-10-CM | POA: Diagnosis not present

## 2022-03-09 DIAGNOSIS — D649 Anemia, unspecified: Secondary | ICD-10-CM | POA: Diagnosis not present

## 2022-03-09 DIAGNOSIS — I5031 Acute diastolic (congestive) heart failure: Secondary | ICD-10-CM | POA: Diagnosis not present

## 2022-03-09 DIAGNOSIS — M160 Bilateral primary osteoarthritis of hip: Secondary | ICD-10-CM

## 2022-03-09 DIAGNOSIS — M109 Gout, unspecified: Secondary | ICD-10-CM | POA: Diagnosis not present

## 2022-03-09 DIAGNOSIS — G4733 Obstructive sleep apnea (adult) (pediatric): Secondary | ICD-10-CM | POA: Diagnosis not present

## 2022-03-09 DIAGNOSIS — E119 Type 2 diabetes mellitus without complications: Secondary | ICD-10-CM | POA: Diagnosis not present

## 2022-03-09 DIAGNOSIS — I110 Hypertensive heart disease with heart failure: Secondary | ICD-10-CM | POA: Diagnosis not present

## 2022-03-09 NOTE — Chronic Care Management (AMB) (Signed)
Chronic Care Management    Clinical Social Work Note  03/09/2022 Name: Michael Payne MRN: [id number] DOB: 01/15/1971  Michael Payne is a 51 y.o. year old male who is a primary care patient of Joaquim Nam, MD. The CCM team was consulted to assist the patient with chronic disease management and/or care coordination needs related to: Transportation Needs  and [employer] .   Engaged with patient by telephone for follow up visit in response to provider referral for social work chronic care management and care coordination services.   Consent to Services:  The patient was given information about Chronic Care Management services, agreed to services, and gave verbal consent prior to initiation of services.  Please see initial visit note for detailed documentation.   Patient agreed to services and consent obtained.   Assessment: Review of patient past medical history, allergies, medications, and health status, including review of relevant consultants reports was performed today as part of a comprehensive evaluation and provision of chronic care management and care coordination services.     SDOH (Social Determinants of Health) assessments and interventions performed:    Advanced Directives Status: Not addressed in this encounter.  CCM Care Plan  Allergies  Allergen Reactions   Aspirin Other (See Comments)    Upset stomach   Lactose Intolerance (Gi) Nausea And Vomiting   Lisinopril     Causes cough    Outpatient Encounter Medications as of 03/09/2022  Medication Sig   acetaminophen (TYLENOL) 325 MG tablet Take 2 tablets (650 mg total) by mouth every 6 (six) hours as needed for headache or mild pain.   allopurinol (ZYLOPRIM) 100 MG tablet Take 1 tablet (100 mg total) by mouth every other day.   amLODipine (NORVASC) 10 MG tablet Take 1 tablet (10 mg total) by mouth daily.   carvedilol (COREG) 3.125 MG tablet TAKE 1 TABLET BY MOUTH TWICE A DAY WITH A MEAL   Colchicine  (MITIGARE) 0.6 MG CAPS Take 0.5 tablets by mouth daily as needed (for gout.  okay to fill with mitigare).   Dulaglutide (TRULICITY) 3 MG/0.5ML SOPN INJECT 3 MG AS DIRECTED ONCE A WEEK   erythromycin ophthalmic ointment Place 1 application. into the left eye 3 (three) times daily. For one week.   furosemide (LASIX) 40 MG tablet Take 1 tablet (40 mg total) by mouth daily.   KLOR-CON M10 10 MEQ tablet TAKE 1 TABLET BY MOUTH EVERY DAY   Multiple Vitamins-Minerals (MULTIVITAMIN WITH MINERALS) tablet Take 1 tablet by mouth daily.   naloxone (NARCAN) nasal spray 4 mg/0.1 mL Spray nostril if needed for opioid overuse- decreased consciousness, decreased respirations   oxyCODONE-acetaminophen (PERCOCET) 10-325 MG tablet Take 1 tablet by mouth every 6 (six) hours as needed for pain.   vitamin C (ASCORBIC ACID) 500 MG tablet Take 500 mg by mouth daily.   No facility-administered encounter medications on file as of 03/09/2022.    Patient Active Problem List   Diagnosis Date Noted   Hordeolum externum of right eye 02/18/2022   Nonobstructive transaminitis 06/28/2021   Decubitus ulcer of posterior thigh, stage 2 (HCC) 06/28/2021   Acute kidney injury (resolved) 06/28/2021   Pre-diabetes 06/10/2021   Obesity, Class III, BMI 40-49.9 (morbid obesity) (HCC) 06/10/2021   Osteoarthritis 06/10/2021   Pressure injury of skin 06/09/2021   Respiratory failure (HCC) 06/01/2021   Acute diastolic CHF (congestive heart failure) (HCC) 05/31/2021   CHF (congestive heart failure) (HCC) 05/31/2021   Hypoxia 05/31/2021   Gout, unspecified 03/10/2021  Edema 03/10/2021   Diabetes mellitus without complication (HCC) 03/10/2021   Muscle spasms of both lower extremities 07/29/2020   Bedbound 05/22/2020   Unilateral osteoarthritis of hip, right 12/27/2019   Severe obstructive sleep apnea 02/22/2018   Hypertriglyceridemia 08/24/2017   Low HDL (under 40) 08/24/2017   Metabolic syndrome 08/24/2017   Morbid obesity (HCC)  10/14/2013   HTN (hypertension) 10/14/2013    Conditions to be addressed/monitored:  Care Plan : LCSW Plan of Care  Updates made by Buck Mam, LCSW since 03/09/2022 12:00 AM     Problem: Social and Functional Symptoms      Long-Range Goal: Optimize pt's quality of life and opportunities   Start Date: 01/25/2021  Expected End Date: 03/18/2022  This Visit's Progress: On track  Recent Progress: On track  Priority: High  Note:   Current barriers:   Transportation, Housing barriers, Level of care concerns, ADL IADL limitations, Social Isolation, Limited access to caregiver, Inability to perform ADL's independently, Inability to perform IADL's independently, and Lacks knowledge of community resource Clinical Goals: Patient will work with CSW  to address needs related to transportation, PCS/CAPS and other needs as assessed and identified Clinical Interventions:  03/09/22- CSW spoke with pt today- " I walked about 3 feet". Pt continues to progress with HHPT and is receiving max hours of PCS care. He is interested in CAPS but will have a $1500 deductible monthly- CSW explained to him the CAPS program is an alternative to nursing home placement and he likely/fortunately does not need that level of care. CSW encouraged pt to keep working with HHPT.  Pt has been provided with resources, guidance and support and will reach out to PCP to request further CSW support/referral if needs change. CSW will sign off.   12/17/21- Pt reports doing better! He is able to stand and take a few steps, moving in bed more independently and feels he is losing weight too. He continues to have HHPT and OT weekly as well as involved with Dietitian  for healthy eating/weight loss, etc- hoping to be approved for bariatric surgery. He shared that he feels a relief since his wife has moved out; continues with his counseling and is optimistic and motivated. CSW offered encouragement and support.  09/28/21- CSW spoke with pt  who reports he has been assigned to a different HHPT throug[facility] and states; "he is tall and pushing me....works me hard".  Pt is able to see progress with his PT as well as weight-does not know what his actual weight is but feels he can "move around in bed easier and abilities.  Pt states he can sit on edge of bed and "stand but not strong enough yet to lock in my knees".   He as able to see the Nutritionist with his daughter/caregiver- "we are on the same accord".  Pt continues with his counseling support and says this is also gong well .  He is in good spirits, motivated and mentions needing help getting a different type of walker- one without a seat and wheels on the back.  Pt will have HHPT call to give specific needs to CSW who can then pass on to PCP for ordering.  Also alerted pt to PCP wanting to get labwork arranged- pt reports he was able to get [facility] to come out to draw the labs in the past (but because of his LASIX was unsuccessful)- advised pt PCP is coordinating this to be done again this Spring for PCP follow  up. Encouragement and support offered to pt .    07/21/21: Received call from Lasandra Beech, LCSW/Heart [facility], indicating pt was in need of transportation to see a Cardiologist for surgical clearance. CSW advised and provided the steps to seek insurance waiver/auth for this.   07/06/21- Pt reports being released from hospital back to home where he has family assistance. Per pt, he is awaiting Home Health visits as well as his PCS care to be resumed. He states he is on the waiting list for CAPS.  Pt continues to seek weight loss surgery and states "I have a bunch of stuff to do".    CSW offered encouragement with all his goals.   date of comprehensive plan of care as evidenced by provider attestation and co-signature Inter-disciplinary care team collaboration (see longitudinal plan of care) Assessment of needs, barriers , agencies contacted, as well as how impacting  Review  various resources, discussed options and provided patient information about Department of Social Services (food stamps, [employer], and utilities assistance), Transportation provided by insurance provider, Enhanced Benefits connected with insurance provider, Referral to care guide for community support and other options, SCAT transportation, and [employer] Alternative Program CAP Patient interviewed and appropriate assessments performed Referred patient to community resources care guide team for assistance with ramps (building/ex[expense,etc)  Provided mental health counseling with regard to pt's current isolation and being homebound/bedbound Provided patient with information about SCAT, CAPS, transportation resources Discussed plans with patient for ongoing care management follow up and provided patient with direct contact information for care management team Advised patient to call DSS worker to request CAPS services Advised patient to complete PCS application/request for increase in hours provided Collaborated with primary care provider re: insurance prior auth process for stretcher transportation Assisted patient/caregiver with obtaining information about health plan benefits Other interventions provided:Solution-Focused Strategies, Active listening / Reflection utilized , Emotional Supportive Provided, Problem Solving /Task Center , Psychoeducation /Health Education, Motivational Interviewing, Brief CBT , Increase in activities / exercise encouraged (with HHPT), and Provided basic mental health support, education   Patient Goals/Self-Care Activities: Over the next 30 days  Follow Up Date 08/13/21   - keep 90 percent of counseling appointments - schedule counseling appointment  -follow up with PCS office re: staffing of hours, etc.  -follow up with Bariatric Coordinator for next steps  - begin a notebook of services in my neighborhood or community - call 211 when I need some help - follow-up on  any referrals for help I am given - think ahead to make sure my need does not become an emergency - make a note about what I need to have by the phone or take with me, like an identification card or social security number have a back-up plan - have a back-up plan - make a list of family or friends that I can call -continue working with Home Health PT   -continue with  virtual  counseling - keep 90 percent of counseling appointments - schedule counseling appointment  -follow up with PCS office re: staffing of hours, etc.  -follow up with Bariatric Coordinator for next steps  - begin a notebook of services in my neighborhood or community - call 211 when I need some help - follow-up on any referrals for help I am given - think ahead to make sure my need does not become an emergency - make a note about what I need to have by the phone or take with me, like an identification card or social security  number have a back-up plan - have a back-up plan - make a list of family or friends that I can call -continue working with Home Health PT      Follow Up Plan: Client will request PCP referral for CSW if further needs arise      Reece Levy MSW, LCSW Licensed Clinical Social Worker [facility]   [phone]

## 2022-03-09 NOTE — Patient Instructions (Signed)
Visit Information  Thank you for taking time to visit with me today. Please don't hesitate to contact me if I can be of assistance to you.  If you are experiencing a Mental Health or Behavioral Health Crisis or need someone to talk to, please call 911   The patient verbalized understanding of instructions, educational materials, and care plan provided today and DECLINED offer to receive copy of patient instructions, educational materials, and care plan.   Melesa Lecy MSW, LCSW Licensed Clinical Social Worker [facility]   [phone]  

## 2022-03-09 NOTE — Telephone Encounter (Signed)
Please set up a video visit that should count as a face-to-face so we can work on getting home health set up.  Thanks.

## 2022-03-10 DIAGNOSIS — M1611 Unilateral primary osteoarthritis, right hip: Secondary | ICD-10-CM | POA: Diagnosis not present

## 2022-03-10 DIAGNOSIS — I5031 Acute diastolic (congestive) heart failure: Secondary | ICD-10-CM | POA: Diagnosis not present

## 2022-03-10 DIAGNOSIS — J9621 Acute and chronic respiratory failure with hypoxia: Secondary | ICD-10-CM | POA: Diagnosis not present

## 2022-03-10 DIAGNOSIS — G4733 Obstructive sleep apnea (adult) (pediatric): Secondary | ICD-10-CM | POA: Diagnosis not present

## 2022-03-10 DIAGNOSIS — F419 Anxiety disorder, unspecified: Secondary | ICD-10-CM | POA: Diagnosis not present

## 2022-03-10 DIAGNOSIS — J9622 Acute and chronic respiratory failure with hypercapnia: Secondary | ICD-10-CM | POA: Diagnosis not present

## 2022-03-10 DIAGNOSIS — F331 Major depressive disorder, recurrent, moderate: Secondary | ICD-10-CM | POA: Diagnosis not present

## 2022-03-10 DIAGNOSIS — M109 Gout, unspecified: Secondary | ICD-10-CM | POA: Diagnosis not present

## 2022-03-10 DIAGNOSIS — E119 Type 2 diabetes mellitus without complications: Secondary | ICD-10-CM | POA: Diagnosis not present

## 2022-03-10 DIAGNOSIS — D649 Anemia, unspecified: Secondary | ICD-10-CM | POA: Diagnosis not present

## 2022-03-10 DIAGNOSIS — I110 Hypertensive heart disease with heart failure: Secondary | ICD-10-CM | POA: Diagnosis not present

## 2022-03-14 DIAGNOSIS — F331 Major depressive disorder, recurrent, moderate: Secondary | ICD-10-CM | POA: Diagnosis not present

## 2022-03-14 DIAGNOSIS — F419 Anxiety disorder, unspecified: Secondary | ICD-10-CM | POA: Diagnosis not present

## 2022-03-15 DIAGNOSIS — M1611 Unilateral primary osteoarthritis, right hip: Secondary | ICD-10-CM | POA: Diagnosis not present

## 2022-03-15 DIAGNOSIS — D649 Anemia, unspecified: Secondary | ICD-10-CM | POA: Diagnosis not present

## 2022-03-15 DIAGNOSIS — G4733 Obstructive sleep apnea (adult) (pediatric): Secondary | ICD-10-CM | POA: Diagnosis not present

## 2022-03-15 DIAGNOSIS — E119 Type 2 diabetes mellitus without complications: Secondary | ICD-10-CM | POA: Diagnosis not present

## 2022-03-15 DIAGNOSIS — I5031 Acute diastolic (congestive) heart failure: Secondary | ICD-10-CM | POA: Diagnosis not present

## 2022-03-15 DIAGNOSIS — J9621 Acute and chronic respiratory failure with hypoxia: Secondary | ICD-10-CM | POA: Diagnosis not present

## 2022-03-15 DIAGNOSIS — I110 Hypertensive heart disease with heart failure: Secondary | ICD-10-CM | POA: Diagnosis not present

## 2022-03-15 DIAGNOSIS — M109 Gout, unspecified: Secondary | ICD-10-CM | POA: Diagnosis not present

## 2022-03-15 DIAGNOSIS — J9622 Acute and chronic respiratory failure with hypercapnia: Secondary | ICD-10-CM | POA: Diagnosis not present

## 2022-03-17 DIAGNOSIS — G4733 Obstructive sleep apnea (adult) (pediatric): Secondary | ICD-10-CM | POA: Diagnosis not present

## 2022-03-17 DIAGNOSIS — F419 Anxiety disorder, unspecified: Secondary | ICD-10-CM | POA: Diagnosis not present

## 2022-03-17 DIAGNOSIS — D649 Anemia, unspecified: Secondary | ICD-10-CM | POA: Diagnosis not present

## 2022-03-17 DIAGNOSIS — J9622 Acute and chronic respiratory failure with hypercapnia: Secondary | ICD-10-CM | POA: Diagnosis not present

## 2022-03-17 DIAGNOSIS — J9621 Acute and chronic respiratory failure with hypoxia: Secondary | ICD-10-CM | POA: Diagnosis not present

## 2022-03-17 DIAGNOSIS — E119 Type 2 diabetes mellitus without complications: Secondary | ICD-10-CM | POA: Diagnosis not present

## 2022-03-17 DIAGNOSIS — M109 Gout, unspecified: Secondary | ICD-10-CM | POA: Diagnosis not present

## 2022-03-17 DIAGNOSIS — I5031 Acute diastolic (congestive) heart failure: Secondary | ICD-10-CM | POA: Diagnosis not present

## 2022-03-17 DIAGNOSIS — I110 Hypertensive heart disease with heart failure: Secondary | ICD-10-CM | POA: Diagnosis not present

## 2022-03-17 DIAGNOSIS — M1611 Unilateral primary osteoarthritis, right hip: Secondary | ICD-10-CM | POA: Diagnosis not present

## 2022-03-17 DIAGNOSIS — F331 Major depressive disorder, recurrent, moderate: Secondary | ICD-10-CM | POA: Diagnosis not present

## 2022-03-18 DIAGNOSIS — I110 Hypertensive heart disease with heart failure: Secondary | ICD-10-CM | POA: Diagnosis not present

## 2022-03-18 DIAGNOSIS — I509 Heart failure, unspecified: Secondary | ICD-10-CM | POA: Diagnosis not present

## 2022-03-18 DIAGNOSIS — M1990 Unspecified osteoarthritis, unspecified site: Secondary | ICD-10-CM | POA: Diagnosis not present

## 2022-03-19 DIAGNOSIS — I110 Hypertensive heart disease with heart failure: Secondary | ICD-10-CM | POA: Diagnosis not present

## 2022-03-19 DIAGNOSIS — M1611 Unilateral primary osteoarthritis, right hip: Secondary | ICD-10-CM | POA: Diagnosis not present

## 2022-03-19 DIAGNOSIS — G4733 Obstructive sleep apnea (adult) (pediatric): Secondary | ICD-10-CM | POA: Diagnosis not present

## 2022-03-19 DIAGNOSIS — E119 Type 2 diabetes mellitus without complications: Secondary | ICD-10-CM | POA: Diagnosis not present

## 2022-03-19 DIAGNOSIS — D649 Anemia, unspecified: Secondary | ICD-10-CM | POA: Diagnosis not present

## 2022-03-19 DIAGNOSIS — M109 Gout, unspecified: Secondary | ICD-10-CM | POA: Diagnosis not present

## 2022-03-19 DIAGNOSIS — I5031 Acute diastolic (congestive) heart failure: Secondary | ICD-10-CM | POA: Diagnosis not present

## 2022-03-19 DIAGNOSIS — J9621 Acute and chronic respiratory failure with hypoxia: Secondary | ICD-10-CM | POA: Diagnosis not present

## 2022-03-19 DIAGNOSIS — J9622 Acute and chronic respiratory failure with hypercapnia: Secondary | ICD-10-CM | POA: Diagnosis not present

## 2022-03-21 DIAGNOSIS — G4733 Obstructive sleep apnea (adult) (pediatric): Secondary | ICD-10-CM | POA: Diagnosis not present

## 2022-03-21 DIAGNOSIS — F411 Generalized anxiety disorder: Secondary | ICD-10-CM | POA: Diagnosis not present

## 2022-03-21 DIAGNOSIS — I110 Hypertensive heart disease with heart failure: Secondary | ICD-10-CM | POA: Diagnosis not present

## 2022-03-21 DIAGNOSIS — E119 Type 2 diabetes mellitus without complications: Secondary | ICD-10-CM | POA: Diagnosis not present

## 2022-03-21 DIAGNOSIS — M1611 Unilateral primary osteoarthritis, right hip: Secondary | ICD-10-CM | POA: Diagnosis not present

## 2022-03-21 DIAGNOSIS — J9621 Acute and chronic respiratory failure with hypoxia: Secondary | ICD-10-CM | POA: Diagnosis not present

## 2022-03-21 DIAGNOSIS — D649 Anemia, unspecified: Secondary | ICD-10-CM | POA: Diagnosis not present

## 2022-03-21 DIAGNOSIS — J9622 Acute and chronic respiratory failure with hypercapnia: Secondary | ICD-10-CM | POA: Diagnosis not present

## 2022-03-21 DIAGNOSIS — M109 Gout, unspecified: Secondary | ICD-10-CM | POA: Diagnosis not present

## 2022-03-21 DIAGNOSIS — I5031 Acute diastolic (congestive) heart failure: Secondary | ICD-10-CM | POA: Diagnosis not present

## 2022-03-21 DIAGNOSIS — F419 Anxiety disorder, unspecified: Secondary | ICD-10-CM | POA: Diagnosis not present

## 2022-03-21 DIAGNOSIS — R32 Unspecified urinary incontinence: Secondary | ICD-10-CM | POA: Diagnosis not present

## 2022-03-23 DIAGNOSIS — D649 Anemia, unspecified: Secondary | ICD-10-CM | POA: Diagnosis not present

## 2022-03-23 DIAGNOSIS — M1611 Unilateral primary osteoarthritis, right hip: Secondary | ICD-10-CM | POA: Diagnosis not present

## 2022-03-23 DIAGNOSIS — M109 Gout, unspecified: Secondary | ICD-10-CM | POA: Diagnosis not present

## 2022-03-23 DIAGNOSIS — J9621 Acute and chronic respiratory failure with hypoxia: Secondary | ICD-10-CM | POA: Diagnosis not present

## 2022-03-23 DIAGNOSIS — I5031 Acute diastolic (congestive) heart failure: Secondary | ICD-10-CM | POA: Diagnosis not present

## 2022-03-23 DIAGNOSIS — J9622 Acute and chronic respiratory failure with hypercapnia: Secondary | ICD-10-CM | POA: Diagnosis not present

## 2022-03-23 DIAGNOSIS — G4733 Obstructive sleep apnea (adult) (pediatric): Secondary | ICD-10-CM | POA: Diagnosis not present

## 2022-03-23 DIAGNOSIS — I110 Hypertensive heart disease with heart failure: Secondary | ICD-10-CM | POA: Diagnosis not present

## 2022-03-23 DIAGNOSIS — E119 Type 2 diabetes mellitus without complications: Secondary | ICD-10-CM | POA: Diagnosis not present

## 2022-03-24 ENCOUNTER — Telehealth: Payer: Self-pay | Admitting: Family Medicine

## 2022-03-24 ENCOUNTER — Telehealth: Payer: Self-pay

## 2022-03-24 DIAGNOSIS — M1611 Unilateral primary osteoarthritis, right hip: Secondary | ICD-10-CM

## 2022-03-24 DIAGNOSIS — F331 Major depressive disorder, recurrent, moderate: Secondary | ICD-10-CM | POA: Diagnosis not present

## 2022-03-24 DIAGNOSIS — F419 Anxiety disorder, unspecified: Secondary | ICD-10-CM | POA: Diagnosis not present

## 2022-03-24 MED ORDER — OXYCODONE-ACETAMINOPHEN 10-325 MG PO TABS: 1.0000 | ORAL_TABLET | Freq: Four times a day (QID) | ORAL | 0 refills | Status: DC | PRN

## 2022-03-24 NOTE — Telephone Encounter (Signed)
  Encourage patient to contact the pharmacy for refills or they can request refills through [facility]  Did the patient contact the pharmacy:  yes   LAST APPOINTMENT DATE:  Please schedule appointment if longer than 1 year  NEXT APPOINTMENT DATE: 03/29/2022  MEDICATION:oxyCODONE-acetaminophen (PERCOCET) 10-325 MG tablet  Is the patient out of medication? no  If not, how much is left? 1 day supply  Is this a 90 day supply: no  PHARMACY: [id number] [place], [place] - [address] Phone:  [phone]  Fax:  [phone]      Let patient know to contact pharmacy at the end of the day to make sure medication is ready.  Please notify patient to allow 48-72 hours to process  CLINICAL FILLS OUT ALL BELOW:   LAST REFILL:  QTY:  REFILL DATE:    OTHER COMMENTS:    Okay for refill?  Please advise

## 2022-03-24 NOTE — Telephone Encounter (Signed)
LOV - 02/18/22 NOV - Not scheduled RF - 02/24/22 #120/0

## 2022-03-24 NOTE — Addendum Note (Signed)
Addended by: Joaquim Nam on: 03/24/2022 02:06 PM   Modules accepted: Orders

## 2022-03-24 NOTE — Telephone Encounter (Signed)
Sent. Thanks.  When can he have DM2 f/u with me?  Please schedule when possible, by video if needed.

## 2022-03-24 NOTE — Chronic Care Management (AMB) (Signed)
    Chronic Care Management Pharmacy Assistant   Name: Michael Payne  MRN: [id number] DOB: 02-Jun-1971   Reason for Encounter: Reminder Call   Conditions to be addressed/monitored: CHF, HTN, and DMII   Recent office visits:  03/07/22- [name],RN(fam med)-telemedicine,doing PT,discussed goals for nutrition,exercise,adherence to medications- no medication changes 02/18/22-[name],NP(fam med)-telemedicine- stye on right eye. We will send erythromycin 0.5% ointment 3 times daily for 7 days 12/17/21-[name],LCSW(fam med)- telemedicine-discussed care coordination-continues HHPT. OT.No Medication changes 12/03/21-[name],MD(PCP)-Telemedicine-f/u, labs ordered no medication changes  Recent consult visits:  02/24/22-Lynette Thompson(int med)-no data found   Hospital visits:  None in previous 6 months  Medications: Outpatient Encounter Medications as of 03/24/2022  Medication Sig   acetaminophen (TYLENOL) 325 MG tablet Take 2 tablets (650 mg total) by mouth every 6 (six) hours as needed for headache or mild pain.   allopurinol (ZYLOPRIM) 100 MG tablet Take 1 tablet (100 mg total) by mouth every other day.   amLODipine (NORVASC) 10 MG tablet Take 1 tablet (10 mg total) by mouth daily.   carvedilol (COREG) 3.125 MG tablet TAKE 1 TABLET BY MOUTH TWICE A DAY WITH A MEAL   Colchicine (MITIGARE) 0.6 MG CAPS Take 0.5 tablets by mouth daily as needed (for gout.  okay to fill with mitigare).   Dulaglutide (TRULICITY) 3 MG/0.5ML SOPN INJECT 3 MG AS DIRECTED ONCE A WEEK   erythromycin ophthalmic ointment Place 1 application. into the left eye 3 (three) times daily. For one week.   furosemide (LASIX) 40 MG tablet Take 1 tablet (40 mg total) by mouth daily.   KLOR-CON M10 10 MEQ tablet TAKE 1 TABLET BY MOUTH EVERY DAY   Multiple Vitamins-Minerals (MULTIVITAMIN WITH MINERALS) tablet Take 1 tablet by mouth daily.   naloxone (NARCAN) nasal spray 4 mg/0.1 mL Spray nostril if needed for opioid  overuse- decreased consciousness, decreased respirations   oxyCODONE-acetaminophen (PERCOCET) 10-325 MG tablet Take 1 tablet by mouth every 6 (six) hours as needed for pain.   vitamin C (ASCORBIC ACID) 500 MG tablet Take 500 mg by mouth daily.   No facility-administered encounter medications on file as of 03/24/2022.   Michael Payne was contacted to remind of upcoming telephone visit with Michael Payne  on 03/29/22 at 3:00pm. Patient was reminded to have any blood glucose and blood pressure readings available for review at appointment.   Message was left reminding patient of appointment.    CCM referral has been placed prior to visit?  Yes    Star Rating Drugs: Medication:  Last Fill: Day Supply Trulicity 3mg   03/23/22  84      05/24/22, CPP notified  Michael Payne, [facility] concierge  [phone]

## 2022-03-25 DIAGNOSIS — J9610 Chronic respiratory failure, unspecified whether with hypoxia or hypercapnia: Secondary | ICD-10-CM | POA: Diagnosis not present

## 2022-03-26 DIAGNOSIS — I5031 Acute diastolic (congestive) heart failure: Secondary | ICD-10-CM | POA: Diagnosis not present

## 2022-03-26 DIAGNOSIS — I110 Hypertensive heart disease with heart failure: Secondary | ICD-10-CM | POA: Diagnosis not present

## 2022-03-26 DIAGNOSIS — J9622 Acute and chronic respiratory failure with hypercapnia: Secondary | ICD-10-CM | POA: Diagnosis not present

## 2022-03-26 DIAGNOSIS — M1611 Unilateral primary osteoarthritis, right hip: Secondary | ICD-10-CM | POA: Diagnosis not present

## 2022-03-26 DIAGNOSIS — D649 Anemia, unspecified: Secondary | ICD-10-CM | POA: Diagnosis not present

## 2022-03-26 DIAGNOSIS — E119 Type 2 diabetes mellitus without complications: Secondary | ICD-10-CM | POA: Diagnosis not present

## 2022-03-26 DIAGNOSIS — J9621 Acute and chronic respiratory failure with hypoxia: Secondary | ICD-10-CM | POA: Diagnosis not present

## 2022-03-26 DIAGNOSIS — M109 Gout, unspecified: Secondary | ICD-10-CM | POA: Diagnosis not present

## 2022-03-26 DIAGNOSIS — G4733 Obstructive sleep apnea (adult) (pediatric): Secondary | ICD-10-CM | POA: Diagnosis not present

## 2022-03-28 DIAGNOSIS — J9621 Acute and chronic respiratory failure with hypoxia: Secondary | ICD-10-CM | POA: Diagnosis not present

## 2022-03-28 DIAGNOSIS — F331 Major depressive disorder, recurrent, moderate: Secondary | ICD-10-CM | POA: Diagnosis not present

## 2022-03-28 DIAGNOSIS — D649 Anemia, unspecified: Secondary | ICD-10-CM | POA: Diagnosis not present

## 2022-03-28 DIAGNOSIS — M109 Gout, unspecified: Secondary | ICD-10-CM | POA: Diagnosis not present

## 2022-03-28 DIAGNOSIS — J9622 Acute and chronic respiratory failure with hypercapnia: Secondary | ICD-10-CM | POA: Diagnosis not present

## 2022-03-28 DIAGNOSIS — G4733 Obstructive sleep apnea (adult) (pediatric): Secondary | ICD-10-CM | POA: Diagnosis not present

## 2022-03-28 DIAGNOSIS — I110 Hypertensive heart disease with heart failure: Secondary | ICD-10-CM | POA: Diagnosis not present

## 2022-03-28 DIAGNOSIS — I5031 Acute diastolic (congestive) heart failure: Secondary | ICD-10-CM | POA: Diagnosis not present

## 2022-03-28 DIAGNOSIS — E119 Type 2 diabetes mellitus without complications: Secondary | ICD-10-CM | POA: Diagnosis not present

## 2022-03-28 DIAGNOSIS — M1611 Unilateral primary osteoarthritis, right hip: Secondary | ICD-10-CM | POA: Diagnosis not present

## 2022-03-28 DIAGNOSIS — F419 Anxiety disorder, unspecified: Secondary | ICD-10-CM | POA: Diagnosis not present

## 2022-03-29 ENCOUNTER — Ambulatory Visit: Payer: Medicare HMO | Admitting: Pharmacist

## 2022-03-29 DIAGNOSIS — M109 Gout, unspecified: Secondary | ICD-10-CM | POA: Diagnosis not present

## 2022-03-29 DIAGNOSIS — E119 Type 2 diabetes mellitus without complications: Secondary | ICD-10-CM

## 2022-03-29 DIAGNOSIS — G4733 Obstructive sleep apnea (adult) (pediatric): Secondary | ICD-10-CM | POA: Diagnosis not present

## 2022-03-29 DIAGNOSIS — D649 Anemia, unspecified: Secondary | ICD-10-CM | POA: Diagnosis not present

## 2022-03-29 DIAGNOSIS — I5031 Acute diastolic (congestive) heart failure: Secondary | ICD-10-CM | POA: Diagnosis not present

## 2022-03-29 DIAGNOSIS — I5032 Chronic diastolic (congestive) heart failure: Secondary | ICD-10-CM

## 2022-03-29 DIAGNOSIS — M1611 Unilateral primary osteoarthritis, right hip: Secondary | ICD-10-CM | POA: Diagnosis not present

## 2022-03-29 DIAGNOSIS — J9621 Acute and chronic respiratory failure with hypoxia: Secondary | ICD-10-CM | POA: Diagnosis not present

## 2022-03-29 DIAGNOSIS — I10 Essential (primary) hypertension: Secondary | ICD-10-CM

## 2022-03-29 DIAGNOSIS — J9622 Acute and chronic respiratory failure with hypercapnia: Secondary | ICD-10-CM | POA: Diagnosis not present

## 2022-03-29 DIAGNOSIS — I110 Hypertensive heart disease with heart failure: Secondary | ICD-10-CM | POA: Diagnosis not present

## 2022-03-29 NOTE — Progress Notes (Signed)
Chronic Care Management Pharmacy Note  04/05/2022 Name:  Michael Payne MRN:  [id number] DOB:  1971/01/13  Summary: CCM F/U visit -Reviewed medications; pt affirms compliance and denies issues -Pt previously had issues obtaining Trulicity due to supply issues, this has resolved and pt has been tolerating 3 mg weekly  Recommendations/Changes made from today's visit: -No med changes  Plan: The patient has been provided with contact information for the care management team and has been advised to call with any health related questions or concerns.      Subjective: Michael Payne is an 51 y.o. year old male who is a primary patient of Damita Dunnings, Elveria Rising, MD.  The CCM team was consulted for assistance with disease management and care coordination needs.    Engaged with patient by telephone for follow up visit in response to provider referral for pharmacy case management and/or care coordination services.   Consent to Services:  The patient was given information about Chronic Care Management services, agreed to services, and gave verbal consent prior to initiation of services.  Please see initial visit note for detailed documentation.   Patient Care Team: [name], MD as PCP - General (Family Medicine) Dannielle Karvonen, RN as Case Manager Chesley Mires, MD as Consulting Physician (Pulmonary Disease) [name], [name], MD as Consulting Physician (Surgery) Charlton Haws, [facility] as Pharmacist (Pharmacist)  Recent office visits: 02/18/22 Romilda Garret NP VV: stye - rx'd erythromycin ointment.  07/05/21 - PCP - Hospital follow up, video visit. Patient was admitted with acute respiratory failure requiring intubation and ICU management. Started BiPAP. CHF, diuresis required. Continue carvedilol, lasix, amlodipine. Will need to follow up with bariatric. 03/09/21 - PCP - Patient presented to PCP for follow up visit. PCP will look into getting a lift and extra help at home, pulmonology  referral and weight loss medication.   Recent consult visits: 08/04/21 - [facility] follow up. Continue Trilogy ventilator. Follow up 6 months. 07/26/21 - Cardiology - Pre-op for bariatric surgery. Continue current medications.   Hospital visits: 05/31/21 - 06/28/21 - Hospital visit - Acute diastolic CHF, respiratory failure. Start furosemide 40 mg daily, potassium chloride 10 mEq daily, and carvedilol 3.125 mg BID.   Objective:  Lab Results  Component Value Date   CREATININE 0.63 (L) 07/26/2021   BUN 12 07/26/2021   GFR 109.18 03/09/2021   GFRNONAA >60 06/23/2021   GFRAA >60 01/03/2020   NA 140 07/26/2021   K 4.3 07/26/2021   CALCIUM 9.2 07/26/2021   CO2 24 07/26/2021   GLUCOSE 101 (H) 07/26/2021   Lab Results  Component Value Date/Time   HGBA1C 5.9 (H) 06/01/2021 12:00 PM   HGBA1C 6.5 03/09/2021 12:32 PM   GFR 109.18 03/09/2021 12:32 PM   GFR 116.29 12/18/2019 05:12 PM    Lab Results  Component Value Date   CHOL 165 03/09/2021   HDL 38.50 (L) 03/09/2021   LDLCALC 101 (H) 03/09/2021   LDLDIRECT 76.0 10/29/2018   TRIG 129.0 03/09/2021   CHOLHDL 4 03/09/2021       Latest Ref Rng & Units 06/23/2021    2:26 AM 06/07/2021    4:46 AM 06/01/2021   12:38 PM  Hepatic Function  Total Protein 6.5 - 8.1 g/dL 6.9  7.7  6.7   Albumin 3.5 - 5.0 g/dL 3.2  3.1  3.2   AST 15 - 41 U/L 86  43  28   ALT 0 - 44 U/L 165  53  40   Alk Phosphatase 38 - 126 U/L 44  43  53   Total Bilirubin 0.3 - 1.2 mg/dL 0.5  0.7  1.2     Lab Results  Component Value Date/Time   TSH 4.33 03/09/2021 12:32 PM   TSH 2.63 12/18/2019 05:12 PM       Latest Ref Rng & Units 07/26/2021    9:48 AM 06/23/2021    2:26 AM 06/14/2021   12:21 AM  CBC  WBC 3.4 - 10.8 x10E3/uL 7.7  6.3  6.3   Hemoglobin 13.0 - 17.7 g/dL 13.4  12.5  12.1   Hematocrit 37.5 - 51.0 % 42.4  42.8  41.5   Platelets 150 - 450 x10E3/uL 261  234  260     Lab Results  Component Value Date/Time   VD25OH 10.08 (L) 12/18/2019  05:12 PM    Clinical ASCVD: No  The 10-year ASCVD risk score (Arnett DK, et al., 2019) is: 17.3%   Values used to calculate the score:     Age: 5 years     Sex: Male     Is Non-Hispanic African American: Yes     Diabetic: Yes     Tobacco smoker: No     Systolic Blood Pressure: 159 mmHg     Is BP treated: Yes     HDL Cholesterol: 38.5 mg/dL     Total Cholesterol: 165 mg/dL       05/15/2021   10:08 AM 04/06/2021   10:33 AM 02/11/2021    1:21 PM  Depression screen PHQ 2/9  Decreased Interest 0 0 0  Down, Depressed, Hopeless 0 0 0  PHQ - 2 Score 0 0 0  Altered sleeping 0 0   Tired, decreased energy 0 0   Change in appetite 0 0   Feeling bad or failure about yourself  0 0   Trouble concentrating 0 0   Moving slowly or fidgety/restless 0 0   Suicidal thoughts 0 0   PHQ-9 Score 0 0   Difficult doing work/chores Not difficult at all Not difficult at all     Social History   Tobacco Use  Smoking Status Former   Packs/day: 2.00   Years: 22.00   Total pack years: 44.00   Types: Cigarettes   Quit date: 02/24/2011   Years since quitting: 11.1  Smokeless Tobacco Never   BP Readings from Last 3 Encounters:  12/03/21 (!) 132/91  08/04/21 135/78  07/26/21 (!) 148/95   Pulse Readings from Last 3 Encounters:  12/03/21 87  07/26/21 95  07/05/21 81   Wt Readings from Last 3 Encounters:  06/18/21 (!) 595 lb 3.9 oz (270 kg)  11/05/20 (!) 490 lb (222.3 kg)  09/02/20 (!) 490 lb (222.3 kg)   BMI Readings from Last 3 Encounters:  12/03/21 72.46 kg/m  07/26/21 72.46 kg/m  07/05/21 83.02 kg/m    Assessment/Interventions: Review of patient past medical history, allergies, medications, health status, including review of consultants reports, laboratory and other test data, was performed as part of comprehensive evaluation and provision of chronic care management services.   SDOH:  (Social Determinants of Health) assessments and interventions performed: No    SDOH Screenings    Alcohol Screen: Low Risk  (05/15/2021)   Alcohol Screen    Last Alcohol Screening Score (AUDIT): 0  Depression (PHQ2-9): Low Risk  (05/15/2021)   Depression (PHQ2-9)    PHQ-2 Score: 0  Financial Resource Strain: Low Risk  (04/06/2021)   Overall Financial  Resource Strain (CARDIA)    Difficulty of Paying Living Expenses: Not hard at all  Food Insecurity: No Food Insecurity (05/15/2021)   Hunger Vital Sign    Worried About Running Out of Food in the Last Year: Never true    [place] in the Last Year: Never true  Housing: Low Risk  (04/06/2021)   Housing    Last Housing Risk Score: 0  Physical Activity: Inactive (09/28/2021)   Exercise Vital Sign    Days of Exercise per Week: 0 days    Minutes of Exercise per Session: 0 min  Social Connections: Unknown (05/15/2021)   Social Connection and Isolation Panel [NHANES]    Frequency of Communication with Friends and Family: Twice a week    Frequency of Social Gatherings with Friends and Family: Once a week    Attends Religious Services: Never    Marine scientist or Organizations: No    Attends Archivist Meetings: Never    Marital Status: Not on file  Stress: Stress Concern Present (05/15/2021)   [employer] [place]    Feeling of Stress : To some extent  Tobacco Use: Medium Risk (12/03/2021)   Patient History    Smoking Tobacco Use: Former    Smokeless Tobacco Use: Never    Passive Exposure: Not on file  Transportation Needs: Unmet Transportation Needs (07/07/2021)   PRAPARE - Hydrologist (Medical): Yes    Lack of Transportation (Non-Medical): Yes    CCM Care Plan  Allergies  Allergen Reactions   Aspirin Other (See Comments)    Upset stomach   Lactose Intolerance (Gi) Nausea And Vomiting   Lisinopril     Causes cough    Medications Reviewed Today     Reviewed by Charlton Haws, [facility] (Pharmacist) on 03/29/22 at 1539   Med List Status: <None>   Medication Order Taking? Sig Documenting Provider Last Dose Status Informant  acetaminophen (TYLENOL) 325 MG tablet [id number] Yes Take 2 tablets (650 mg total) by mouth every 6 (six) hours as needed for headache or mild pain. Samella Parr, NP Taking Active   allopurinol (ZYLOPRIM) 100 MG tablet [id number] Yes Take 1 tablet (100 mg total) by mouth every other day. [name], MD Taking Active   amLODipine (NORVASC) 10 MG tablet [id number] Yes Take 1 tablet (10 mg total) by mouth daily. [name], MD Taking Active   carvedilol (COREG) 3.125 MG tablet [id number] Yes TAKE 1 TABLET BY MOUTH TWICE A DAY WITH A MEAL [name], MD Taking Active   Colchicine (MITIGARE) 0.6 MG CAPS [id number] Yes Take 0.5 tablets by mouth daily as needed (for gout.  okay to fill with mitigare). [name], MD Taking Active   Dulaglutide (TRULICITY) 3 VV/7.4MO [place] Aid [id number] Yes INJECT 3 MG AS DIRECTED ONCE A WEEK [name], MD Taking Active   erythromycin ophthalmic ointment [id number] Yes Place 1 application. into the left eye 3 (three) times daily. For one week. Michela Pitcher, NP Taking Active   furosemide (LASIX) 40 MG tablet [id number] Yes Take 1 tablet (40 mg total) by mouth daily. [name], MD Taking Active   KLOR-CON M10 10 MEQ tablet [id number] Yes TAKE 1 TABLET BY MOUTH EVERY DAY [name], MD Taking Active   Multiple Vitamins-Minerals (MULTIVITAMIN WITH MINERALS) tablet [id number] Yes Take 1 tablet by mouth daily. [provider] Taking Active  naloxone [facility]) nasal spray 4 mg/0.1 mL [id number] Yes Spray nostril if needed for opioid overuse- decreased consciousness, decreased respirations Elby Beck, FNP Taking Active Self  oxyCODONE-acetaminophen (PERCOCET) 10-325 MG tablet [id number] Yes Take 1 tablet by mouth every 6 (six) hours as needed for pain. [name], MD Taking Active   vitamin C (ASCORBIC ACID) 500 MG tablet  [id number] Yes Take 500 mg by mouth daily. [provider] Taking Active             Patient Active Problem List   Diagnosis Date Noted   Hordeolum externum of right eye 02/18/2022   Nonobstructive transaminitis 06/28/2021   Decubitus ulcer of posterior thigh, stage 2 ([place]) 06/28/2021   Acute kidney injury (resolved) 06/28/2021   Pre-diabetes 06/10/2021   Obesity, Class III, BMI 40-49.9 (morbid obesity) ([place]) 06/10/2021   Osteoarthritis 06/10/2021   Pressure injury of skin 06/09/2021   Respiratory failure ([place]) 97/94/8016   Acute diastolic CHF (congestive heart failure) ([place]) 05/31/2021   CHF (congestive heart failure) ([place]) 05/31/2021   Hypoxia 05/31/2021   Gout, unspecified 03/10/2021   Edema 03/10/2021   Diabetes mellitus without complication ([place]) 55/37/4827   Muscle spasms of both lower extremities 07/29/2020   Bedbound 05/22/2020   Unilateral osteoarthritis of hip, right 12/27/2019   Severe obstructive sleep apnea 02/22/2018   Hypertriglyceridemia 08/24/2017   Low HDL (under 40) 07/86/7544   Metabolic syndrome 92/09/69   Morbid obesity ([place]) 10/14/2013   HTN (hypertension) 10/14/2013    Immunization History  Administered Date(s) Administered   Tdap 02/03/2012    Conditions to be addressed/monitored:  Hypertension, Diabetes, and Heart Failure  Care Plan : [place]  Updates made by Charlton Haws, [place] since 04/05/2022 12:00 AM     Problem: Hypertension, Diabetes, and Heart Failure   Priority: High     Long-Range Goal: Disease mgmt   Expected End Date: 04/05/2022  This Visit's Progress: On track  Priority: High  Note:   Current Barriers:  None identified  Pharmacist Clinical Goal(s):  Patient will contact provider office for questions/concerns as evidenced notation of same in electronic health record through collaboration with PharmD and provider.   Interventions: 1:1 collaboration with [name], MD regarding  development and update of comprehensive plan of care as evidenced by provider attestation and co-signature Inter-disciplinary care team collaboration (see longitudinal plan of care) Comprehensive medication review performed; medication list updated in electronic medical record  CHF (Diastolic) and Hypertension (BP goal <140/90) -Controlled - per pt report; he reports swelling is worse some days, fine some days; he asked how long he has to take potassium -Unable to weigh daily as pt is bed bound -Current treatment: Amlodipine 10 mg daily - Appropriate, Effective, Safe, Accessible Carvedilol 3.125 mg BID -Appropriate, Effective, Safe, Accessible Furosemide 40 mg daily -Appropriate, Effective, Safe, Accessible Potassium chloride 10 mEq daily -Appropriate, Effective, Safe, Accessible -Medications previously tried: none reported  -Discussed reason for potassium (diuretic-induced potassium loss), advised he will likely take potassium as long as he takes lasix -Continue to monitor BP at home, document, and provide log at future appointments -Recommended to continue current medication  Diabetes/Weight Loss (A1c goal <7%) -Controlled - A1c 5.9% (05/2021) at goal; pt previously had issues with Trulicity supply issues but this has resolved and he is tolerating 3 mg weekly -He is seeking approval for bariatric surgery - will see nutrition and diabetes education for 6 months starting 09/16/21 prior to bariatric surgery -Current medications: Trulicity 3  mg weekly (Tuesdays) -Appropriate, Effective, Safe, Accessible -Medications previously tried: None reported   -Denies hypoglycemic/hyperglycemic symptoms -Current exercise: bed bound -Educated on A1c and blood sugar goals; Discussed increasing Trulicity dose. -Recommend to continue current medication  Patient Goals/Self-Care Activities Patient will:  - take medications as prescribed - continue to follow up with plan for bariatric surgery - call CCM  team with any questions/concrns     Medication Assistance: None required.  Patient affirms current coverage meets needs.  Medication Access: Within the past 30 days, how often has patient missed a dose of medication? 0 Is a pillbox or other method used to improve adherence? Yes  Factors that may affect medication adherence? no barriers identified Are meds synced by current pharmacy? No  Are meds delivered by current pharmacy? Yes  Does patient experience delays in picking up medications due to transportation concerns? No   [facility] Services Reviewed: Is patient disadvantaged to use [facility]?: Yes  Current Rx insurance plan: [employer] [place] Name and location of Current pharmacy:  [id number] [place]Phone: [phone]: [phone] [address] [address] [place]Phone: [phone]: [phone] [facility] services reviewed with patient today?: No  Patient requests to transfer care to [facility]?: No  Reason patient declined to change pharmacies: Disadvantaged due to insurance/mail order   Care Plan and Follow Up Patient Decision:  Patient agrees to Care Plan and Follow-up.  Follow Up Plan: The patient has been provided with contact information for the care management team and has been advised to call with any health related questions or concerns.    LCharlene Brooke PharmD, BCACP Clinical Pharmacist [phone]

## 2022-03-30 DIAGNOSIS — J9622 Acute and chronic respiratory failure with hypercapnia: Secondary | ICD-10-CM | POA: Diagnosis not present

## 2022-03-30 DIAGNOSIS — D649 Anemia, unspecified: Secondary | ICD-10-CM | POA: Diagnosis not present

## 2022-03-30 DIAGNOSIS — J9621 Acute and chronic respiratory failure with hypoxia: Secondary | ICD-10-CM | POA: Diagnosis not present

## 2022-03-30 DIAGNOSIS — M109 Gout, unspecified: Secondary | ICD-10-CM | POA: Diagnosis not present

## 2022-03-30 DIAGNOSIS — I110 Hypertensive heart disease with heart failure: Secondary | ICD-10-CM | POA: Diagnosis not present

## 2022-03-30 DIAGNOSIS — I5031 Acute diastolic (congestive) heart failure: Secondary | ICD-10-CM | POA: Diagnosis not present

## 2022-03-30 DIAGNOSIS — M1611 Unilateral primary osteoarthritis, right hip: Secondary | ICD-10-CM | POA: Diagnosis not present

## 2022-03-30 DIAGNOSIS — E119 Type 2 diabetes mellitus without complications: Secondary | ICD-10-CM | POA: Diagnosis not present

## 2022-03-30 DIAGNOSIS — G4733 Obstructive sleep apnea (adult) (pediatric): Secondary | ICD-10-CM | POA: Diagnosis not present

## 2022-03-31 DIAGNOSIS — J9621 Acute and chronic respiratory failure with hypoxia: Secondary | ICD-10-CM | POA: Diagnosis not present

## 2022-03-31 DIAGNOSIS — M1611 Unilateral primary osteoarthritis, right hip: Secondary | ICD-10-CM | POA: Diagnosis not present

## 2022-03-31 DIAGNOSIS — F331 Major depressive disorder, recurrent, moderate: Secondary | ICD-10-CM | POA: Diagnosis not present

## 2022-03-31 DIAGNOSIS — I110 Hypertensive heart disease with heart failure: Secondary | ICD-10-CM | POA: Diagnosis not present

## 2022-03-31 DIAGNOSIS — J9622 Acute and chronic respiratory failure with hypercapnia: Secondary | ICD-10-CM | POA: Diagnosis not present

## 2022-03-31 DIAGNOSIS — I5031 Acute diastolic (congestive) heart failure: Secondary | ICD-10-CM | POA: Diagnosis not present

## 2022-03-31 DIAGNOSIS — E119 Type 2 diabetes mellitus without complications: Secondary | ICD-10-CM | POA: Diagnosis not present

## 2022-03-31 DIAGNOSIS — G4733 Obstructive sleep apnea (adult) (pediatric): Secondary | ICD-10-CM | POA: Diagnosis not present

## 2022-03-31 DIAGNOSIS — F419 Anxiety disorder, unspecified: Secondary | ICD-10-CM | POA: Diagnosis not present

## 2022-03-31 DIAGNOSIS — M109 Gout, unspecified: Secondary | ICD-10-CM | POA: Diagnosis not present

## 2022-03-31 DIAGNOSIS — D649 Anemia, unspecified: Secondary | ICD-10-CM | POA: Diagnosis not present

## 2022-04-04 DIAGNOSIS — I10 Essential (primary) hypertension: Secondary | ICD-10-CM | POA: Diagnosis not present

## 2022-04-05 DIAGNOSIS — J9621 Acute and chronic respiratory failure with hypoxia: Secondary | ICD-10-CM | POA: Diagnosis not present

## 2022-04-05 DIAGNOSIS — M109 Gout, unspecified: Secondary | ICD-10-CM | POA: Diagnosis not present

## 2022-04-05 DIAGNOSIS — J9622 Acute and chronic respiratory failure with hypercapnia: Secondary | ICD-10-CM | POA: Diagnosis not present

## 2022-04-05 DIAGNOSIS — E119 Type 2 diabetes mellitus without complications: Secondary | ICD-10-CM | POA: Diagnosis not present

## 2022-04-05 DIAGNOSIS — M1611 Unilateral primary osteoarthritis, right hip: Secondary | ICD-10-CM | POA: Diagnosis not present

## 2022-04-05 DIAGNOSIS — I5031 Acute diastolic (congestive) heart failure: Secondary | ICD-10-CM | POA: Diagnosis not present

## 2022-04-05 DIAGNOSIS — I110 Hypertensive heart disease with heart failure: Secondary | ICD-10-CM | POA: Diagnosis not present

## 2022-04-05 DIAGNOSIS — I10 Essential (primary) hypertension: Secondary | ICD-10-CM | POA: Diagnosis not present

## 2022-04-05 DIAGNOSIS — D649 Anemia, unspecified: Secondary | ICD-10-CM | POA: Diagnosis not present

## 2022-04-05 DIAGNOSIS — G4733 Obstructive sleep apnea (adult) (pediatric): Secondary | ICD-10-CM | POA: Diagnosis not present

## 2022-04-05 NOTE — Patient Instructions (Signed)
Visit Information  Phone number for Pharmacist: [phone]   Goals Addressed   None     Care Plan : CCM Pharmacy Care Plan  Updates made by Kathyrn Sheriff, RPH since 04/05/2022 12:00 AM     Problem: Hypertension, Diabetes, and Heart Failure   Priority: High     Long-Range Goal: Disease mgmt   Expected End Date: 04/05/2022  This Visit's Progress: On track  Priority: High  Note:   Current Barriers:  None identified  Pharmacist Clinical Goal(s):  Patient will contact provider office for questions/concerns as evidenced notation of same in electronic health record through collaboration with PharmD and provider.   Interventions: 1:1 collaboration with Joaquim Nam, MD regarding development and update of comprehensive plan of care as evidenced by provider attestation and co-signature Inter-disciplinary care team collaboration (see longitudinal plan of care) Comprehensive medication review performed; medication list updated in electronic medical record  CHF (Diastolic) and Hypertension (BP goal <140/90) -Controlled - per pt report; he reports swelling is worse some days, fine some days; he asked how long he has to take potassium -Unable to weigh daily as pt is bed bound -Current treatment: Amlodipine 10 mg daily - Appropriate, Effective, Safe, Accessible Carvedilol 3.125 mg BID -Appropriate, Effective, Safe, Accessible Furosemide 40 mg daily -Appropriate, Effective, Safe, Accessible Potassium chloride 10 mEq daily -Appropriate, Effective, Safe, Accessible -Medications previously tried: none reported  -Discussed reason for potassium (diuretic-induced potassium loss), advised he will likely take potassium as long as he takes lasix -Continue to monitor BP at home, document, and provide log at future appointments -Recommended to continue current medication  Diabetes/Weight Loss (A1c goal <7%) -Controlled - A1c 5.9% (05/2021) at goal; pt previously had issues with Trulicity  supply issues but this has resolved and he is tolerating 3 mg weekly -He is seeking approval for bariatric surgery - will see nutrition and diabetes education for 6 months starting 09/16/21 prior to bariatric surgery -Current medications: Trulicity 3 mg weekly (Tuesdays) -Appropriate, Effective, Safe, Accessible -Medications previously tried: None reported   -Denies hypoglycemic/hyperglycemic symptoms -Current exercise: bed bound -Educated on A1c and blood sugar goals; Discussed increasing Trulicity dose. -Recommend to continue current medication  Patient Goals/Self-Care Activities Patient will:  - take medications as prescribed - continue to follow up with plan for bariatric surgery - call CCM team with any questions/concrns      Patient verbalizes understanding of instructions and care plan provided today and agrees to view in MyChart. Active MyChart status and patient understanding of how to access instructions and care plan via MyChart confirmed with patient.    The patient has been provided with contact information for the care management team and has been advised to call with any health related questions or concerns.    [name], PharmD, BCACP Clinical Pharmacist [facility] at [facility] [phone]

## 2022-04-06 ENCOUNTER — Telehealth: Payer: Self-pay | Admitting: Family Medicine

## 2022-04-06 ENCOUNTER — Other Ambulatory Visit: Payer: Self-pay | Admitting: Family Medicine

## 2022-04-06 DIAGNOSIS — I110 Hypertensive heart disease with heart failure: Secondary | ICD-10-CM | POA: Diagnosis not present

## 2022-04-06 DIAGNOSIS — D649 Anemia, unspecified: Secondary | ICD-10-CM | POA: Diagnosis not present

## 2022-04-06 DIAGNOSIS — M109 Gout, unspecified: Secondary | ICD-10-CM | POA: Diagnosis not present

## 2022-04-06 DIAGNOSIS — I5031 Acute diastolic (congestive) heart failure: Secondary | ICD-10-CM | POA: Diagnosis not present

## 2022-04-06 DIAGNOSIS — I10 Essential (primary) hypertension: Secondary | ICD-10-CM | POA: Diagnosis not present

## 2022-04-06 DIAGNOSIS — E119 Type 2 diabetes mellitus without complications: Secondary | ICD-10-CM | POA: Diagnosis not present

## 2022-04-06 DIAGNOSIS — G4733 Obstructive sleep apnea (adult) (pediatric): Secondary | ICD-10-CM | POA: Diagnosis not present

## 2022-04-06 DIAGNOSIS — J9621 Acute and chronic respiratory failure with hypoxia: Secondary | ICD-10-CM | POA: Diagnosis not present

## 2022-04-06 DIAGNOSIS — J9622 Acute and chronic respiratory failure with hypercapnia: Secondary | ICD-10-CM | POA: Diagnosis not present

## 2022-04-06 DIAGNOSIS — M1611 Unilateral primary osteoarthritis, right hip: Secondary | ICD-10-CM | POA: Diagnosis not present

## 2022-04-06 NOTE — Telephone Encounter (Signed)
Noted. Thanks.

## 2022-04-06 NOTE — Telephone Encounter (Signed)
Dyann Ruddle called from [facility], had physical therapy with the patient yesterday and he had a fall. Call back number [phone]. Called EMS services and they got him back up on the bed, no injuries reported.

## 2022-04-07 ENCOUNTER — Ambulatory Visit: Payer: Medicare HMO

## 2022-04-07 DIAGNOSIS — M109 Gout, unspecified: Secondary | ICD-10-CM | POA: Diagnosis not present

## 2022-04-07 DIAGNOSIS — F419 Anxiety disorder, unspecified: Secondary | ICD-10-CM | POA: Diagnosis not present

## 2022-04-07 DIAGNOSIS — I10 Essential (primary) hypertension: Secondary | ICD-10-CM | POA: Diagnosis not present

## 2022-04-07 DIAGNOSIS — J9621 Acute and chronic respiratory failure with hypoxia: Secondary | ICD-10-CM | POA: Diagnosis not present

## 2022-04-07 DIAGNOSIS — M1611 Unilateral primary osteoarthritis, right hip: Secondary | ICD-10-CM | POA: Diagnosis not present

## 2022-04-07 DIAGNOSIS — I110 Hypertensive heart disease with heart failure: Secondary | ICD-10-CM | POA: Diagnosis not present

## 2022-04-07 DIAGNOSIS — E119 Type 2 diabetes mellitus without complications: Secondary | ICD-10-CM | POA: Diagnosis not present

## 2022-04-07 DIAGNOSIS — D649 Anemia, unspecified: Secondary | ICD-10-CM | POA: Diagnosis not present

## 2022-04-07 DIAGNOSIS — I5031 Acute diastolic (congestive) heart failure: Secondary | ICD-10-CM | POA: Diagnosis not present

## 2022-04-07 DIAGNOSIS — G4733 Obstructive sleep apnea (adult) (pediatric): Secondary | ICD-10-CM | POA: Diagnosis not present

## 2022-04-07 DIAGNOSIS — J9622 Acute and chronic respiratory failure with hypercapnia: Secondary | ICD-10-CM | POA: Diagnosis not present

## 2022-04-07 DIAGNOSIS — F331 Major depressive disorder, recurrent, moderate: Secondary | ICD-10-CM | POA: Diagnosis not present

## 2022-04-08 DIAGNOSIS — I10 Essential (primary) hypertension: Secondary | ICD-10-CM | POA: Diagnosis not present

## 2022-04-09 DIAGNOSIS — I10 Essential (primary) hypertension: Secondary | ICD-10-CM | POA: Diagnosis not present

## 2022-04-10 DIAGNOSIS — I10 Essential (primary) hypertension: Secondary | ICD-10-CM | POA: Diagnosis not present

## 2022-04-11 DIAGNOSIS — F331 Major depressive disorder, recurrent, moderate: Secondary | ICD-10-CM | POA: Diagnosis not present

## 2022-04-11 DIAGNOSIS — I10 Essential (primary) hypertension: Secondary | ICD-10-CM | POA: Diagnosis not present

## 2022-04-11 DIAGNOSIS — F419 Anxiety disorder, unspecified: Secondary | ICD-10-CM | POA: Diagnosis not present

## 2022-04-12 DIAGNOSIS — M109 Gout, unspecified: Secondary | ICD-10-CM | POA: Diagnosis not present

## 2022-04-12 DIAGNOSIS — I110 Hypertensive heart disease with heart failure: Secondary | ICD-10-CM | POA: Diagnosis not present

## 2022-04-12 DIAGNOSIS — D649 Anemia, unspecified: Secondary | ICD-10-CM | POA: Diagnosis not present

## 2022-04-12 DIAGNOSIS — E119 Type 2 diabetes mellitus without complications: Secondary | ICD-10-CM | POA: Diagnosis not present

## 2022-04-12 DIAGNOSIS — M1611 Unilateral primary osteoarthritis, right hip: Secondary | ICD-10-CM | POA: Diagnosis not present

## 2022-04-12 DIAGNOSIS — J9622 Acute and chronic respiratory failure with hypercapnia: Secondary | ICD-10-CM | POA: Diagnosis not present

## 2022-04-12 DIAGNOSIS — G4733 Obstructive sleep apnea (adult) (pediatric): Secondary | ICD-10-CM | POA: Diagnosis not present

## 2022-04-12 DIAGNOSIS — I10 Essential (primary) hypertension: Secondary | ICD-10-CM | POA: Diagnosis not present

## 2022-04-12 DIAGNOSIS — J9621 Acute and chronic respiratory failure with hypoxia: Secondary | ICD-10-CM | POA: Diagnosis not present

## 2022-04-12 DIAGNOSIS — I5031 Acute diastolic (congestive) heart failure: Secondary | ICD-10-CM | POA: Diagnosis not present

## 2022-04-13 ENCOUNTER — Encounter: Payer: Self-pay | Admitting: Family Medicine

## 2022-04-13 DIAGNOSIS — I10 Essential (primary) hypertension: Secondary | ICD-10-CM | POA: Diagnosis not present

## 2022-04-14 ENCOUNTER — Other Ambulatory Visit: Payer: Self-pay

## 2022-04-14 DIAGNOSIS — F419 Anxiety disorder, unspecified: Secondary | ICD-10-CM | POA: Diagnosis not present

## 2022-04-14 DIAGNOSIS — F331 Major depressive disorder, recurrent, moderate: Secondary | ICD-10-CM | POA: Diagnosis not present

## 2022-04-14 DIAGNOSIS — M1611 Unilateral primary osteoarthritis, right hip: Secondary | ICD-10-CM | POA: Diagnosis not present

## 2022-04-14 DIAGNOSIS — G4733 Obstructive sleep apnea (adult) (pediatric): Secondary | ICD-10-CM | POA: Diagnosis not present

## 2022-04-14 DIAGNOSIS — I5031 Acute diastolic (congestive) heart failure: Secondary | ICD-10-CM | POA: Diagnosis not present

## 2022-04-14 DIAGNOSIS — I10 Essential (primary) hypertension: Secondary | ICD-10-CM | POA: Diagnosis not present

## 2022-04-14 DIAGNOSIS — E119 Type 2 diabetes mellitus without complications: Secondary | ICD-10-CM | POA: Diagnosis not present

## 2022-04-14 DIAGNOSIS — D649 Anemia, unspecified: Secondary | ICD-10-CM | POA: Diagnosis not present

## 2022-04-14 DIAGNOSIS — I110 Hypertensive heart disease with heart failure: Secondary | ICD-10-CM | POA: Diagnosis not present

## 2022-04-14 DIAGNOSIS — J9621 Acute and chronic respiratory failure with hypoxia: Secondary | ICD-10-CM | POA: Diagnosis not present

## 2022-04-14 DIAGNOSIS — M109 Gout, unspecified: Secondary | ICD-10-CM | POA: Diagnosis not present

## 2022-04-14 DIAGNOSIS — J9622 Acute and chronic respiratory failure with hypercapnia: Secondary | ICD-10-CM | POA: Diagnosis not present

## 2022-04-14 MED ORDER — POTASSIUM CHLORIDE CRYS ER 10 MEQ PO TBCR: 10.0000 meq | EXTENDED_RELEASE_TABLET | Freq: Every day | ORAL | 1 refills | Status: DC

## 2022-04-15 DIAGNOSIS — I110 Hypertensive heart disease with heart failure: Secondary | ICD-10-CM | POA: Diagnosis not present

## 2022-04-15 DIAGNOSIS — M109 Gout, unspecified: Secondary | ICD-10-CM | POA: Diagnosis not present

## 2022-04-15 DIAGNOSIS — D649 Anemia, unspecified: Secondary | ICD-10-CM | POA: Diagnosis not present

## 2022-04-15 DIAGNOSIS — E119 Type 2 diabetes mellitus without complications: Secondary | ICD-10-CM | POA: Diagnosis not present

## 2022-04-15 DIAGNOSIS — I5031 Acute diastolic (congestive) heart failure: Secondary | ICD-10-CM | POA: Diagnosis not present

## 2022-04-15 DIAGNOSIS — J9622 Acute and chronic respiratory failure with hypercapnia: Secondary | ICD-10-CM | POA: Diagnosis not present

## 2022-04-15 DIAGNOSIS — J9621 Acute and chronic respiratory failure with hypoxia: Secondary | ICD-10-CM | POA: Diagnosis not present

## 2022-04-15 DIAGNOSIS — M1611 Unilateral primary osteoarthritis, right hip: Secondary | ICD-10-CM | POA: Diagnosis not present

## 2022-04-15 DIAGNOSIS — G4733 Obstructive sleep apnea (adult) (pediatric): Secondary | ICD-10-CM | POA: Diagnosis not present

## 2022-04-15 DIAGNOSIS — I10 Essential (primary) hypertension: Secondary | ICD-10-CM | POA: Diagnosis not present

## 2022-04-15 NOTE — Telephone Encounter (Signed)
April from [facility] called back. They are trying to figure out how to get blood work on this patient. I discussed with her that we have been trying this as well for a good while. I had reached out before to his [facility] agency at [facility] about they possibly drawing his blood. The clinical manger Arlys John at [facility] said patient would need to qualify for nursing care in order for them to send an RN out to draw his blood. We had sent over a referral to see if patient could get any services including nursing care. Looks like patient did not qualify for nursing and is only getting PT currently. April stated that she is going to go back to the drawing board on this and speak with the surgeon about what else they may can do for the patient before surgery.

## 2022-04-16 DIAGNOSIS — I10 Essential (primary) hypertension: Secondary | ICD-10-CM | POA: Diagnosis not present

## 2022-04-17 DIAGNOSIS — I10 Essential (primary) hypertension: Secondary | ICD-10-CM | POA: Diagnosis not present

## 2022-04-18 DIAGNOSIS — F419 Anxiety disorder, unspecified: Secondary | ICD-10-CM | POA: Diagnosis not present

## 2022-04-18 DIAGNOSIS — F331 Major depressive disorder, recurrent, moderate: Secondary | ICD-10-CM | POA: Diagnosis not present

## 2022-04-18 DIAGNOSIS — I10 Essential (primary) hypertension: Secondary | ICD-10-CM | POA: Diagnosis not present

## 2022-04-19 DIAGNOSIS — D649 Anemia, unspecified: Secondary | ICD-10-CM | POA: Diagnosis not present

## 2022-04-19 DIAGNOSIS — M1611 Unilateral primary osteoarthritis, right hip: Secondary | ICD-10-CM | POA: Diagnosis not present

## 2022-04-19 DIAGNOSIS — I5031 Acute diastolic (congestive) heart failure: Secondary | ICD-10-CM | POA: Diagnosis not present

## 2022-04-19 DIAGNOSIS — I110 Hypertensive heart disease with heart failure: Secondary | ICD-10-CM | POA: Diagnosis not present

## 2022-04-19 DIAGNOSIS — J9622 Acute and chronic respiratory failure with hypercapnia: Secondary | ICD-10-CM | POA: Diagnosis not present

## 2022-04-19 DIAGNOSIS — J9621 Acute and chronic respiratory failure with hypoxia: Secondary | ICD-10-CM | POA: Diagnosis not present

## 2022-04-19 DIAGNOSIS — G4733 Obstructive sleep apnea (adult) (pediatric): Secondary | ICD-10-CM | POA: Diagnosis not present

## 2022-04-19 DIAGNOSIS — I10 Essential (primary) hypertension: Secondary | ICD-10-CM | POA: Diagnosis not present

## 2022-04-19 DIAGNOSIS — R32 Unspecified urinary incontinence: Secondary | ICD-10-CM | POA: Diagnosis not present

## 2022-04-19 DIAGNOSIS — E119 Type 2 diabetes mellitus without complications: Secondary | ICD-10-CM | POA: Diagnosis not present

## 2022-04-19 DIAGNOSIS — M109 Gout, unspecified: Secondary | ICD-10-CM | POA: Diagnosis not present

## 2022-04-20 DIAGNOSIS — I10 Essential (primary) hypertension: Secondary | ICD-10-CM | POA: Diagnosis not present

## 2022-04-20 NOTE — Telephone Encounter (Signed)
Please let me know what you hear from the surgery clinic.  Thanks.

## 2022-04-21 DIAGNOSIS — G4733 Obstructive sleep apnea (adult) (pediatric): Secondary | ICD-10-CM | POA: Diagnosis not present

## 2022-04-21 DIAGNOSIS — I5031 Acute diastolic (congestive) heart failure: Secondary | ICD-10-CM | POA: Diagnosis not present

## 2022-04-21 DIAGNOSIS — J9621 Acute and chronic respiratory failure with hypoxia: Secondary | ICD-10-CM | POA: Diagnosis not present

## 2022-04-21 DIAGNOSIS — F419 Anxiety disorder, unspecified: Secondary | ICD-10-CM | POA: Diagnosis not present

## 2022-04-21 DIAGNOSIS — E119 Type 2 diabetes mellitus without complications: Secondary | ICD-10-CM | POA: Diagnosis not present

## 2022-04-21 DIAGNOSIS — M1611 Unilateral primary osteoarthritis, right hip: Secondary | ICD-10-CM | POA: Diagnosis not present

## 2022-04-21 DIAGNOSIS — F331 Major depressive disorder, recurrent, moderate: Secondary | ICD-10-CM | POA: Diagnosis not present

## 2022-04-21 DIAGNOSIS — M109 Gout, unspecified: Secondary | ICD-10-CM | POA: Diagnosis not present

## 2022-04-21 DIAGNOSIS — I10 Essential (primary) hypertension: Secondary | ICD-10-CM | POA: Diagnosis not present

## 2022-04-21 DIAGNOSIS — J9622 Acute and chronic respiratory failure with hypercapnia: Secondary | ICD-10-CM | POA: Diagnosis not present

## 2022-04-21 DIAGNOSIS — I110 Hypertensive heart disease with heart failure: Secondary | ICD-10-CM | POA: Diagnosis not present

## 2022-04-21 DIAGNOSIS — D649 Anemia, unspecified: Secondary | ICD-10-CM | POA: Diagnosis not present

## 2022-04-22 ENCOUNTER — Telehealth: Payer: Medicare HMO

## 2022-04-22 ENCOUNTER — Telehealth: Payer: Self-pay

## 2022-04-22 DIAGNOSIS — I10 Essential (primary) hypertension: Secondary | ICD-10-CM | POA: Diagnosis not present

## 2022-04-22 NOTE — Telephone Encounter (Signed)
  Care Management   Follow Up Note   04/22/2022 Name: Michael Payne MRN: [id number] DOB: Sep 14, 1971   Referred by: Joaquim Nam, MD Reason for referral : Care Coordination   An unsuccessful telephone outreach was attempted today. The patient was referred to the case management team for assistance with care management and care coordination.   Follow Up Plan: The care management team will reach out to the patient again over the next 14 days.    George Ina RN,BSN,CCM RN Care Manager Coordinator [place]  [phone]

## 2022-04-23 ENCOUNTER — Other Ambulatory Visit: Payer: Self-pay | Admitting: Family Medicine

## 2022-04-23 DIAGNOSIS — I110 Hypertensive heart disease with heart failure: Secondary | ICD-10-CM | POA: Diagnosis not present

## 2022-04-23 DIAGNOSIS — E119 Type 2 diabetes mellitus without complications: Secondary | ICD-10-CM | POA: Diagnosis not present

## 2022-04-23 DIAGNOSIS — I5031 Acute diastolic (congestive) heart failure: Secondary | ICD-10-CM | POA: Diagnosis not present

## 2022-04-23 DIAGNOSIS — M109 Gout, unspecified: Secondary | ICD-10-CM | POA: Diagnosis not present

## 2022-04-23 DIAGNOSIS — G4733 Obstructive sleep apnea (adult) (pediatric): Secondary | ICD-10-CM | POA: Diagnosis not present

## 2022-04-23 DIAGNOSIS — J9622 Acute and chronic respiratory failure with hypercapnia: Secondary | ICD-10-CM | POA: Diagnosis not present

## 2022-04-23 DIAGNOSIS — J9621 Acute and chronic respiratory failure with hypoxia: Secondary | ICD-10-CM | POA: Diagnosis not present

## 2022-04-23 DIAGNOSIS — M1611 Unilateral primary osteoarthritis, right hip: Secondary | ICD-10-CM | POA: Diagnosis not present

## 2022-04-23 DIAGNOSIS — D649 Anemia, unspecified: Secondary | ICD-10-CM | POA: Diagnosis not present

## 2022-04-24 DIAGNOSIS — I10 Essential (primary) hypertension: Secondary | ICD-10-CM | POA: Diagnosis not present

## 2022-04-25 ENCOUNTER — Telehealth: Payer: Self-pay | Admitting: Family Medicine

## 2022-04-25 DIAGNOSIS — D649 Anemia, unspecified: Secondary | ICD-10-CM | POA: Diagnosis not present

## 2022-04-25 DIAGNOSIS — F419 Anxiety disorder, unspecified: Secondary | ICD-10-CM | POA: Diagnosis not present

## 2022-04-25 DIAGNOSIS — I5031 Acute diastolic (congestive) heart failure: Secondary | ICD-10-CM | POA: Diagnosis not present

## 2022-04-25 DIAGNOSIS — J9622 Acute and chronic respiratory failure with hypercapnia: Secondary | ICD-10-CM | POA: Diagnosis not present

## 2022-04-25 DIAGNOSIS — J9610 Chronic respiratory failure, unspecified whether with hypoxia or hypercapnia: Secondary | ICD-10-CM | POA: Diagnosis not present

## 2022-04-25 DIAGNOSIS — F331 Major depressive disorder, recurrent, moderate: Secondary | ICD-10-CM | POA: Diagnosis not present

## 2022-04-25 DIAGNOSIS — M1611 Unilateral primary osteoarthritis, right hip: Secondary | ICD-10-CM | POA: Diagnosis not present

## 2022-04-25 DIAGNOSIS — E119 Type 2 diabetes mellitus without complications: Secondary | ICD-10-CM | POA: Diagnosis not present

## 2022-04-25 DIAGNOSIS — I110 Hypertensive heart disease with heart failure: Secondary | ICD-10-CM | POA: Diagnosis not present

## 2022-04-25 DIAGNOSIS — J9621 Acute and chronic respiratory failure with hypoxia: Secondary | ICD-10-CM | POA: Diagnosis not present

## 2022-04-25 DIAGNOSIS — M109 Gout, unspecified: Secondary | ICD-10-CM | POA: Diagnosis not present

## 2022-04-25 DIAGNOSIS — G4733 Obstructive sleep apnea (adult) (pediatric): Secondary | ICD-10-CM | POA: Diagnosis not present

## 2022-04-25 DIAGNOSIS — I10 Essential (primary) hypertension: Secondary | ICD-10-CM | POA: Diagnosis not present

## 2022-04-25 MED ORDER — OXYCODONE-ACETAMINOPHEN 10-325 MG PO TABS: 1.0000 | ORAL_TABLET | Freq: Four times a day (QID) | ORAL | 0 refills | Status: DC | PRN

## 2022-04-25 NOTE — Telephone Encounter (Signed)
Pt called in to know status of refill on oxyCODONE-acetaminophen (PERCOCET) 10-325 MG tablet need's to go to [facility]

## 2022-04-25 NOTE — Telephone Encounter (Signed)
ERx due to PCP out of office

## 2022-04-25 NOTE — Telephone Encounter (Signed)
Patient called states that local pharmacy is out of medication. Would need refill sent into [facility] [address]  Patient will be out of medication today.   Oxycodone 10-325 1 tab every 6 hrs  #120 sent in 05/10/2022  Dr. Para March is out of the office this week. Would you be able to refill or do we need to hold for his return?

## 2022-04-25 NOTE — Telephone Encounter (Signed)
Home Health verbal orders Caller Name: Kreg Shropshire Agency Name: [facility] number: [phone]  Requesting HH PT  Frequency: 1 time a week for 1 week, 2x week for 8 weeks  Please forward to [facility] pool or providers CMA

## 2022-04-26 DIAGNOSIS — M109 Gout, unspecified: Secondary | ICD-10-CM | POA: Diagnosis not present

## 2022-04-26 DIAGNOSIS — J9622 Acute and chronic respiratory failure with hypercapnia: Secondary | ICD-10-CM | POA: Diagnosis not present

## 2022-04-26 DIAGNOSIS — I5031 Acute diastolic (congestive) heart failure: Secondary | ICD-10-CM | POA: Diagnosis not present

## 2022-04-26 DIAGNOSIS — J9621 Acute and chronic respiratory failure with hypoxia: Secondary | ICD-10-CM | POA: Diagnosis not present

## 2022-04-26 DIAGNOSIS — G4733 Obstructive sleep apnea (adult) (pediatric): Secondary | ICD-10-CM | POA: Diagnosis not present

## 2022-04-26 DIAGNOSIS — E119 Type 2 diabetes mellitus without complications: Secondary | ICD-10-CM | POA: Diagnosis not present

## 2022-04-26 DIAGNOSIS — D649 Anemia, unspecified: Secondary | ICD-10-CM | POA: Diagnosis not present

## 2022-04-26 DIAGNOSIS — I110 Hypertensive heart disease with heart failure: Secondary | ICD-10-CM | POA: Diagnosis not present

## 2022-04-26 DIAGNOSIS — M1611 Unilateral primary osteoarthritis, right hip: Secondary | ICD-10-CM | POA: Diagnosis not present

## 2022-04-26 DIAGNOSIS — I10 Essential (primary) hypertension: Secondary | ICD-10-CM | POA: Diagnosis not present

## 2022-04-26 NOTE — Telephone Encounter (Signed)
Please give the order.  Thanks.   

## 2022-04-27 DIAGNOSIS — I5031 Acute diastolic (congestive) heart failure: Secondary | ICD-10-CM | POA: Diagnosis not present

## 2022-04-27 DIAGNOSIS — M109 Gout, unspecified: Secondary | ICD-10-CM | POA: Diagnosis not present

## 2022-04-27 DIAGNOSIS — I10 Essential (primary) hypertension: Secondary | ICD-10-CM | POA: Diagnosis not present

## 2022-04-27 DIAGNOSIS — E119 Type 2 diabetes mellitus without complications: Secondary | ICD-10-CM | POA: Diagnosis not present

## 2022-04-27 DIAGNOSIS — M1611 Unilateral primary osteoarthritis, right hip: Secondary | ICD-10-CM | POA: Diagnosis not present

## 2022-04-27 DIAGNOSIS — J9622 Acute and chronic respiratory failure with hypercapnia: Secondary | ICD-10-CM | POA: Diagnosis not present

## 2022-04-27 DIAGNOSIS — I110 Hypertensive heart disease with heart failure: Secondary | ICD-10-CM | POA: Diagnosis not present

## 2022-04-27 DIAGNOSIS — D649 Anemia, unspecified: Secondary | ICD-10-CM | POA: Diagnosis not present

## 2022-04-27 DIAGNOSIS — J9621 Acute and chronic respiratory failure with hypoxia: Secondary | ICD-10-CM | POA: Diagnosis not present

## 2022-04-27 DIAGNOSIS — G4733 Obstructive sleep apnea (adult) (pediatric): Secondary | ICD-10-CM | POA: Diagnosis not present

## 2022-04-27 NOTE — Telephone Encounter (Signed)
Verbal orders have been left on secure VM

## 2022-04-28 DIAGNOSIS — I10 Essential (primary) hypertension: Secondary | ICD-10-CM | POA: Diagnosis not present

## 2022-04-29 DIAGNOSIS — I10 Essential (primary) hypertension: Secondary | ICD-10-CM | POA: Diagnosis not present

## 2022-04-30 DIAGNOSIS — I10 Essential (primary) hypertension: Secondary | ICD-10-CM | POA: Diagnosis not present

## 2022-05-01 DIAGNOSIS — I10 Essential (primary) hypertension: Secondary | ICD-10-CM | POA: Diagnosis not present

## 2022-05-03 DIAGNOSIS — I5031 Acute diastolic (congestive) heart failure: Secondary | ICD-10-CM | POA: Diagnosis not present

## 2022-05-03 DIAGNOSIS — I10 Essential (primary) hypertension: Secondary | ICD-10-CM | POA: Diagnosis not present

## 2022-05-03 DIAGNOSIS — I110 Hypertensive heart disease with heart failure: Secondary | ICD-10-CM | POA: Diagnosis not present

## 2022-05-03 DIAGNOSIS — E119 Type 2 diabetes mellitus without complications: Secondary | ICD-10-CM | POA: Diagnosis not present

## 2022-05-03 DIAGNOSIS — J9622 Acute and chronic respiratory failure with hypercapnia: Secondary | ICD-10-CM | POA: Diagnosis not present

## 2022-05-03 DIAGNOSIS — G4733 Obstructive sleep apnea (adult) (pediatric): Secondary | ICD-10-CM | POA: Diagnosis not present

## 2022-05-03 DIAGNOSIS — D649 Anemia, unspecified: Secondary | ICD-10-CM | POA: Diagnosis not present

## 2022-05-03 DIAGNOSIS — M1611 Unilateral primary osteoarthritis, right hip: Secondary | ICD-10-CM | POA: Diagnosis not present

## 2022-05-03 DIAGNOSIS — J9621 Acute and chronic respiratory failure with hypoxia: Secondary | ICD-10-CM | POA: Diagnosis not present

## 2022-05-03 DIAGNOSIS — M109 Gout, unspecified: Secondary | ICD-10-CM | POA: Diagnosis not present

## 2022-05-04 DIAGNOSIS — I10 Essential (primary) hypertension: Secondary | ICD-10-CM | POA: Diagnosis not present

## 2022-05-05 DIAGNOSIS — J9622 Acute and chronic respiratory failure with hypercapnia: Secondary | ICD-10-CM | POA: Diagnosis not present

## 2022-05-05 DIAGNOSIS — D649 Anemia, unspecified: Secondary | ICD-10-CM | POA: Diagnosis not present

## 2022-05-05 DIAGNOSIS — I10 Essential (primary) hypertension: Secondary | ICD-10-CM | POA: Diagnosis not present

## 2022-05-05 DIAGNOSIS — E119 Type 2 diabetes mellitus without complications: Secondary | ICD-10-CM | POA: Diagnosis not present

## 2022-05-05 DIAGNOSIS — M109 Gout, unspecified: Secondary | ICD-10-CM | POA: Diagnosis not present

## 2022-05-05 DIAGNOSIS — M1611 Unilateral primary osteoarthritis, right hip: Secondary | ICD-10-CM | POA: Diagnosis not present

## 2022-05-05 DIAGNOSIS — F331 Major depressive disorder, recurrent, moderate: Secondary | ICD-10-CM | POA: Diagnosis not present

## 2022-05-05 DIAGNOSIS — J9621 Acute and chronic respiratory failure with hypoxia: Secondary | ICD-10-CM | POA: Diagnosis not present

## 2022-05-05 DIAGNOSIS — I5031 Acute diastolic (congestive) heart failure: Secondary | ICD-10-CM | POA: Diagnosis not present

## 2022-05-05 DIAGNOSIS — I110 Hypertensive heart disease with heart failure: Secondary | ICD-10-CM | POA: Diagnosis not present

## 2022-05-05 DIAGNOSIS — F419 Anxiety disorder, unspecified: Secondary | ICD-10-CM | POA: Diagnosis not present

## 2022-05-05 DIAGNOSIS — G4733 Obstructive sleep apnea (adult) (pediatric): Secondary | ICD-10-CM | POA: Diagnosis not present

## 2022-05-07 DIAGNOSIS — I10 Essential (primary) hypertension: Secondary | ICD-10-CM | POA: Diagnosis not present

## 2022-05-08 DIAGNOSIS — I10 Essential (primary) hypertension: Secondary | ICD-10-CM | POA: Diagnosis not present

## 2022-05-09 ENCOUNTER — Telehealth: Payer: Self-pay

## 2022-05-09 DIAGNOSIS — F419 Anxiety disorder, unspecified: Secondary | ICD-10-CM | POA: Diagnosis not present

## 2022-05-09 DIAGNOSIS — F331 Major depressive disorder, recurrent, moderate: Secondary | ICD-10-CM | POA: Diagnosis not present

## 2022-05-09 DIAGNOSIS — I10 Essential (primary) hypertension: Secondary | ICD-10-CM | POA: Diagnosis not present

## 2022-05-09 NOTE — Progress Notes (Signed)
This encounter was created in error - please disregard.

## 2022-05-09 NOTE — Telephone Encounter (Addendum)
  Care Management   Follow Up Note   05/09/2022 Name: Michael Payne MRN: [id number] DOB: 28-Mar-1971   Referred by: Joaquim Nam, MD Reason for referral : Care Coordination   An unsuccessful telephone outreach was attempted today. The patient was referred to the case management team for assistance with care management and care coordination.  Attempted call to patient x 2.  Unable to reach patient or leave voice message.  Phone rang twice then hung up on both attempts.   Follow Up Plan: The care management team will reach out to the patient again over the next 45 days.   George Ina RN,BSN,CCM RN Care Manager Coordinator [phone]

## 2022-05-10 ENCOUNTER — Encounter: Payer: Self-pay | Admitting: Family Medicine

## 2022-05-10 ENCOUNTER — Telehealth (INDEPENDENT_AMBULATORY_CARE_PROVIDER_SITE_OTHER): Payer: Medicare HMO | Admitting: Family Medicine

## 2022-05-10 DIAGNOSIS — M109 Gout, unspecified: Secondary | ICD-10-CM

## 2022-05-10 DIAGNOSIS — J9621 Acute and chronic respiratory failure with hypoxia: Secondary | ICD-10-CM | POA: Diagnosis not present

## 2022-05-10 DIAGNOSIS — R739 Hyperglycemia, unspecified: Secondary | ICD-10-CM | POA: Diagnosis not present

## 2022-05-10 DIAGNOSIS — M25511 Pain in right shoulder: Secondary | ICD-10-CM | POA: Diagnosis not present

## 2022-05-10 DIAGNOSIS — D649 Anemia, unspecified: Secondary | ICD-10-CM | POA: Diagnosis not present

## 2022-05-10 DIAGNOSIS — I5032 Chronic diastolic (congestive) heart failure: Secondary | ICD-10-CM

## 2022-05-10 DIAGNOSIS — I10 Essential (primary) hypertension: Secondary | ICD-10-CM | POA: Diagnosis not present

## 2022-05-10 DIAGNOSIS — M1611 Unilateral primary osteoarthritis, right hip: Secondary | ICD-10-CM | POA: Diagnosis not present

## 2022-05-10 DIAGNOSIS — G4733 Obstructive sleep apnea (adult) (pediatric): Secondary | ICD-10-CM | POA: Diagnosis not present

## 2022-05-10 DIAGNOSIS — I110 Hypertensive heart disease with heart failure: Secondary | ICD-10-CM | POA: Diagnosis not present

## 2022-05-10 DIAGNOSIS — I5031 Acute diastolic (congestive) heart failure: Secondary | ICD-10-CM | POA: Diagnosis not present

## 2022-05-10 DIAGNOSIS — E119 Type 2 diabetes mellitus without complications: Secondary | ICD-10-CM | POA: Diagnosis not present

## 2022-05-10 DIAGNOSIS — J9622 Acute and chronic respiratory failure with hypercapnia: Secondary | ICD-10-CM | POA: Diagnosis not present

## 2022-05-10 MED ORDER — CELECOXIB 100 MG PO CAPS: 100.0000 mg | ORAL_CAPSULE | Freq: Two times a day (BID) | ORAL | 1 refills | Status: DC

## 2022-05-10 NOTE — Progress Notes (Unsigned)
Interactive audio and video telecommunications were attempted between this provider and patient, however failed, due to patient having technical difficulties OR patient did not have access to video capability.  We continued and completed visit with audio only.   Virtual Visit via Telephone Note  I connected with patient on 05/10/22  at 2:49 PM  by telephone and verified that I am speaking with the correct person using two identifiers.  Location of patient: home   Location of MD: [facility] Name of referring provider (if blank then none associated): Names per persons and role in encounter:  MD: Ferd Hibbs, Patient: name listed above.    I discussed the limitations, risks, security and privacy concerns of performing an evaluation and management service by telephone and the availability of in person appointments. I also discussed with the patient that there may be a patient responsible charge related to this service. The patient expressed understanding and agreed to proceed.  CC: follow up.   History of Present Illness:   Living at home, with his daughter.  His other kids are home intermittently.    No recent gout flares.    Still on oxycodone at baseline for chronic pain.  No ADE on med.    He weighed 2 weeks ago, down to 450 lbs from 625 lbs.  He feels good about the change and is working on continued loss.  His energy level is better.  He is standing and taking steps now.  He is still in PT.    D/w pt about trying to get labs done.  He is going to get labs done for preop through surgery at [facility].  He is going to get labs done.    R>L shoulder pain in the AMs.  L knee and ankle pain.  R shoulder pain waking patient up at night.  More pain with weather changes.  Pressure sensation in the R shoulder with arm abduction and above the head ROM.  In general the pain comes and goes but is worse when not moving his arm.  Going on for the past year or two.  Oxycodone helps his shoulder  pain some.     Observations/Objective: Nad Speech normal.  Assessment and Plan: Obesity.  Significant weight loss with diet.  He is working with PT.  He is able to stand now.  He is going to call [facility] about getting his follow-up set in anticipation of bariatric surgery.  I thanked him for his effort.  See following phone note we will [facility] with [facility] about getting his labs done.  He has orders in the EMR.  He still dealing with chronic pain and taking oxycodone at baseline without sedation.  The medication helps his pain.  He is having significant right shoulder pain.   D/w pt about trial of celebrex given most recent Cr 0.63, d/w pt. we will see about trying to get an x-ray of his shoulder done when he has his labs done.  Follow Up Instructions: see above.     I discussed the assessment and treatment plan with the patient. The patient was provided an opportunity to ask questions and all were answered. The patient agreed with the plan and demonstrated an understanding of the instructions.   The patient was advised to call back or seek an in-person evaluation if the symptoms worsen or if the condition fails to improve as anticipated.  I provided 21 minutes of non-face-to-face time during this encounter.  Cheree Ditto  Damita Dunnings, MD

## 2022-05-11 ENCOUNTER — Telehealth: Payer: Self-pay | Admitting: Family Medicine

## 2022-05-11 DIAGNOSIS — I10 Essential (primary) hypertension: Secondary | ICD-10-CM | POA: Diagnosis not present

## 2022-05-11 DIAGNOSIS — M25511 Pain in right shoulder: Secondary | ICD-10-CM | POA: Insufficient documentation

## 2022-05-11 NOTE — Assessment & Plan Note (Signed)
See above

## 2022-05-11 NOTE — Assessment & Plan Note (Signed)
Obesity.  Significant weight loss with diet.  He is working with PT.  He is able to stand now.  He is going to call [facility] about getting his follow-up set in anticipation of bariatric surgery.  I thanked him for his effort.  See following phone note we will [facility] with [facility] about getting his labs done.  He has orders in the EMR.  He still dealing with chronic pain and taking oxycodone at baseline without sedation.  The medication helps his pain.  He is having significant right shoulder pain.   D/w pt about trial of celebrex given most recent Cr 0.63, d/w pt. we will see about trying to get an x-ray of his shoulder done when he has his labs done.

## 2022-05-11 NOTE — Telephone Encounter (Signed)
Please send a copy of my office visit note to [facility] surgery.  I put in orders in the EMR.  I would also like to get a right shoulder film done at the same time, when he goes in for labs.  I would like to coordinate all of this with them.  Thanks.

## 2022-05-12 DIAGNOSIS — J9621 Acute and chronic respiratory failure with hypoxia: Secondary | ICD-10-CM | POA: Diagnosis not present

## 2022-05-12 DIAGNOSIS — I110 Hypertensive heart disease with heart failure: Secondary | ICD-10-CM | POA: Diagnosis not present

## 2022-05-12 DIAGNOSIS — I10 Essential (primary) hypertension: Secondary | ICD-10-CM | POA: Diagnosis not present

## 2022-05-12 DIAGNOSIS — F331 Major depressive disorder, recurrent, moderate: Secondary | ICD-10-CM | POA: Diagnosis not present

## 2022-05-12 DIAGNOSIS — F419 Anxiety disorder, unspecified: Secondary | ICD-10-CM | POA: Diagnosis not present

## 2022-05-12 DIAGNOSIS — K219 Gastro-esophageal reflux disease without esophagitis: Secondary | ICD-10-CM

## 2022-05-12 DIAGNOSIS — E119 Type 2 diabetes mellitus without complications: Secondary | ICD-10-CM | POA: Diagnosis not present

## 2022-05-12 DIAGNOSIS — M1611 Unilateral primary osteoarthritis, right hip: Secondary | ICD-10-CM | POA: Diagnosis not present

## 2022-05-12 DIAGNOSIS — I5031 Acute diastolic (congestive) heart failure: Secondary | ICD-10-CM | POA: Diagnosis not present

## 2022-05-12 DIAGNOSIS — D649 Anemia, unspecified: Secondary | ICD-10-CM | POA: Diagnosis not present

## 2022-05-12 DIAGNOSIS — G4733 Obstructive sleep apnea (adult) (pediatric): Secondary | ICD-10-CM | POA: Diagnosis not present

## 2022-05-12 DIAGNOSIS — M109 Gout, unspecified: Secondary | ICD-10-CM | POA: Diagnosis not present

## 2022-05-12 DIAGNOSIS — Z7985 Long-term (current) use of injectable non-insulin antidiabetic drugs: Secondary | ICD-10-CM

## 2022-05-12 DIAGNOSIS — Z87891 Personal history of nicotine dependence: Secondary | ICD-10-CM

## 2022-05-12 DIAGNOSIS — Z6841 Body Mass Index (BMI) 40.0 and over, adult: Secondary | ICD-10-CM

## 2022-05-12 DIAGNOSIS — J9622 Acute and chronic respiratory failure with hypercapnia: Secondary | ICD-10-CM | POA: Diagnosis not present

## 2022-05-12 NOTE — Telephone Encounter (Signed)
Called and spoke with April with [facility] about patient and where we are in getting his pre op done with them for surgery. She stated that they are waiting on some clearance ppw that patient has to send back in to them before they can do any scheduling. Nothing is needed from [place] at this time. I asked if we could possibly add in doing a shoulder xray/scan during this time as well since it is so hard to get him anywhere. She said that she would try to keep me in the loop of what is going on to help get this done for the patient. I gave April my direct number to call if she hears anything further.

## 2022-05-13 DIAGNOSIS — G4733 Obstructive sleep apnea (adult) (pediatric): Secondary | ICD-10-CM | POA: Diagnosis not present

## 2022-05-13 DIAGNOSIS — I5031 Acute diastolic (congestive) heart failure: Secondary | ICD-10-CM | POA: Diagnosis not present

## 2022-05-13 DIAGNOSIS — I110 Hypertensive heart disease with heart failure: Secondary | ICD-10-CM | POA: Diagnosis not present

## 2022-05-13 DIAGNOSIS — J9621 Acute and chronic respiratory failure with hypoxia: Secondary | ICD-10-CM | POA: Diagnosis not present

## 2022-05-13 DIAGNOSIS — D649 Anemia, unspecified: Secondary | ICD-10-CM | POA: Diagnosis not present

## 2022-05-13 DIAGNOSIS — E119 Type 2 diabetes mellitus without complications: Secondary | ICD-10-CM | POA: Diagnosis not present

## 2022-05-13 DIAGNOSIS — M1611 Unilateral primary osteoarthritis, right hip: Secondary | ICD-10-CM | POA: Diagnosis not present

## 2022-05-13 DIAGNOSIS — J9622 Acute and chronic respiratory failure with hypercapnia: Secondary | ICD-10-CM | POA: Diagnosis not present

## 2022-05-13 DIAGNOSIS — I10 Essential (primary) hypertension: Secondary | ICD-10-CM | POA: Diagnosis not present

## 2022-05-13 DIAGNOSIS — M109 Gout, unspecified: Secondary | ICD-10-CM | POA: Diagnosis not present

## 2022-05-13 NOTE — Telephone Encounter (Signed)
Noted. Thanks.

## 2022-05-14 DIAGNOSIS — I10 Essential (primary) hypertension: Secondary | ICD-10-CM | POA: Diagnosis not present

## 2022-05-15 DIAGNOSIS — I10 Essential (primary) hypertension: Secondary | ICD-10-CM | POA: Diagnosis not present

## 2022-05-16 ENCOUNTER — Ambulatory Visit: Payer: Medicare HMO

## 2022-05-16 DIAGNOSIS — I10 Essential (primary) hypertension: Secondary | ICD-10-CM | POA: Diagnosis not present

## 2022-05-17 ENCOUNTER — Ambulatory Visit (INDEPENDENT_AMBULATORY_CARE_PROVIDER_SITE_OTHER): Payer: Medicare HMO

## 2022-05-17 VITALS — Ht 76.0 in

## 2022-05-17 DIAGNOSIS — M1611 Unilateral primary osteoarthritis, right hip: Secondary | ICD-10-CM | POA: Diagnosis not present

## 2022-05-17 DIAGNOSIS — J9622 Acute and chronic respiratory failure with hypercapnia: Secondary | ICD-10-CM | POA: Diagnosis not present

## 2022-05-17 DIAGNOSIS — G4733 Obstructive sleep apnea (adult) (pediatric): Secondary | ICD-10-CM | POA: Diagnosis not present

## 2022-05-17 DIAGNOSIS — M109 Gout, unspecified: Secondary | ICD-10-CM | POA: Diagnosis not present

## 2022-05-17 DIAGNOSIS — D649 Anemia, unspecified: Secondary | ICD-10-CM | POA: Diagnosis not present

## 2022-05-17 DIAGNOSIS — I110 Hypertensive heart disease with heart failure: Secondary | ICD-10-CM | POA: Diagnosis not present

## 2022-05-17 DIAGNOSIS — I5031 Acute diastolic (congestive) heart failure: Secondary | ICD-10-CM | POA: Diagnosis not present

## 2022-05-17 DIAGNOSIS — Z1211 Encounter for screening for malignant neoplasm of colon: Secondary | ICD-10-CM

## 2022-05-17 DIAGNOSIS — E119 Type 2 diabetes mellitus without complications: Secondary | ICD-10-CM | POA: Diagnosis not present

## 2022-05-17 DIAGNOSIS — J9621 Acute and chronic respiratory failure with hypoxia: Secondary | ICD-10-CM | POA: Diagnosis not present

## 2022-05-17 DIAGNOSIS — Z0000 Encounter for general adult medical examination without abnormal findings: Secondary | ICD-10-CM

## 2022-05-17 DIAGNOSIS — I10 Essential (primary) hypertension: Secondary | ICD-10-CM | POA: Diagnosis not present

## 2022-05-17 NOTE — Patient Instructions (Addendum)
[name] , Thank you for taking time to come for your Medicare Wellness Visit. I appreciate your ongoing commitment to your health goals. Please review the following plan we discussed and let me know if I can assist you in the future.   Screening recommendations/referrals: Colonoscopy: Referral placed 05/17/22 Recommended yearly ophthalmology/optometry visit for glaucoma screening and checkup Recommended yearly dental visit for hygiene and checkup  Vaccinations: Influenza vaccine: Declined Tdap vaccine: Due Shingles vaccine: Declined   Covid-19: Declined Opioid Pain Medicine Management Opioids are powerful medicines that are used to treat moderate to severe pain. When used for short periods of time, they can help you to: Sleep better. Do better in physical or occupational therapy. Feel better in the first few days after an injury. Recover from surgery. Opioids should be taken with the supervision of a trained health care provider. They should be taken for the shortest period of time possible. This is because opioids can be addictive, and the longer you take opioids, the greater your risk of addiction. This addiction can also be called opioid use disorder. What are the risks? Using opioid pain medicines for longer than 3 days increases your risk of side effects. Side effects include: Constipation. Nausea and vomiting. Breathing difficulties (respiratory depression). Drowsiness. Confusion. Opioid use disorder. Itching. Taking opioid pain medicine for a long period of time can affect your ability to do daily tasks. It also puts you at risk for: Motor vehicle crashes. Depression. Suicide. Heart attack. Overdose, which can be life-threatening. What is a pain treatment plan? A pain treatment plan is an agreement between you and your health care provider. Pain is unique to each person, and treatments vary depending on your condition. To manage your pain, you and your health care provider  need to work together. To help you do this: Discuss the goals of your treatment, including how much pain you might expect to have and how you will manage the pain. Review the risks and benefits of taking opioid medicines. Remember that a good treatment plan uses more than one approach and minimizes the chance of side effects. Be honest about the amount of medicines you take and about any drug or alcohol use. Get pain medicine prescriptions from only one health care provider. Pain can be managed with many types of alternative treatments. Ask your health care provider to refer you to one or more specialists who can help you manage pain through: Physical or occupational therapy. Counseling (cognitive behavioral therapy). Good nutrition. Biofeedback. Massage. Meditation. Non-opioid medicine. Following a gentle exercise program. How to use opioid pain medicine Taking medicine Take your pain medicine exactly as told by your health care provider. Take it only when you need it. If your pain gets less severe, you may take less than your prescribed dose if your health care provider approves. If you are not having pain, do nottake pain medicine unless your health care provider tells you to take it. If your pain is severe, do nottry to treat it yourself by taking more pills than instructed on your prescription. Contact your health care provider for help. Write down the times when you take your pain medicine. It is easy to become confused while on pain medicine. Writing the time can help you avoid overdose. Take other over-the-counter or prescription medicines only as told by your health care provider. Keeping yourself and others safe  While you are taking opioid pain medicine: Do not drive, use machinery, or power tools. Do not sign legal documents. Do  not drink alcohol. Do not take sleeping pills. Do not supervise children by yourself. Do not do activities that require climbing or being in high  places. Do not go to a lake, river, ocean, spa, or swimming pool. Do not share your pain medicine with anyone. Keep pain medicine in a locked cabinet or in a secure area where pets and children cannot reach it. Stopping your use of opioids If you have been taking opioid medicine for more than a few weeks, you may need to slowly decrease (taper) how much you take until you stop completely. Tapering your use of opioids can decrease your risk of symptoms of withdrawal, such as: Pain and cramping in the abdomen. Nausea. Sweating. Sleepiness. Restlessness. Uncontrollable shaking (tremors). Cravings for the medicine. Do not attempt to taper your use of opioids on your own. Talk with your health care provider about how to do this. Your health care provider may prescribe a step-down schedule based on how much medicine you are taking and how long you have been taking it. Getting rid of leftover pills Do not save any leftover pills. Get rid of leftover pills safely by: Taking the medicine to a prescription take-back program. This is usually offered by the county or law enforcement. Bringing them to a pharmacy that has a drug disposal container. Flushing them down the toilet. Check the label or package insert of your medicine to see whether this is safe to do. Throwing them out in the trash. Check the label or package insert of your medicine to see whether this is safe to do. If it is safe to throw it out, remove the medicine from the original container, put it into a sealable bag or container, and mix it with used coffee grounds, food scraps, dirt, or cat litter before putting it in the trash. Follow these instructions at home: Activity Do exercises as told by your health care provider. Avoid activities that make your pain worse. Return to your normal activities as told by your health care provider. Ask your health care provider what activities are safe for you. General instructions You may need to  take these actions to prevent or treat constipation: Drink enough fluid to keep your urine pale yellow. Take over-the-counter or prescription medicines. Eat foods that are high in fiber, such as beans, whole grains, and fresh fruits and vegetables. Limit foods that are high in fat and processed sugars, such as fried or sweet foods. Keep all follow-up visits. This is important. Where to find support If you have been taking opioids for a long time, you may benefit from receiving support for quitting from a local support group or counselor. Ask your health care provider for a referral to these resources in your area. Where to find more information Centers for Disease Control and Prevention (CDC): FootballExhibition.com.br [place] Food and Drug Administration (FDA): PumpkinSearch.com.ee Get help right away if: You may have taken too much of an opioid (overdosed). Common symptoms of an overdose: Your breathing is slower or more shallow than normal. You have a very slow heartbeat (pulse). You have slurred speech. You have nausea and vomiting. Your pupils become very small. You have other potential symptoms: You are very confused. You faint or feel like you will faint. You have cold, clammy skin. You have blue lips or fingernails. You have thoughts of harming yourself or harming others. These symptoms may represent a serious problem that is an emergency. Do not wait to see if the symptoms will go away. Get  medical help right away. Call your local emergency services (911 in the [place] Do not drive yourself to the hospital.  If you ever feel like you may hurt yourself or others, or have thoughts about taking your own life, get help right away. Go to your nearest emergency department or: Call your local emergency services (911 in the [place] Call the [facility] (351-219-2210 in the [place] Call a suicide crisis helpline, such as the National Suicide Prevention Lifeline at 629-490-1259 or 988 in the  [place] This is open 24 hours a day in the [place] Text the Crisis Text Line at (437) 747-7559 (in the [place] Summary Opioid medicines can help you manage moderate to severe pain for a short period of time. A pain treatment plan is an agreement between you and your health care provider. Discuss the goals of your treatment, including how much pain you might expect to have and how you will manage the pain. If you think that you or someone else may have taken too much of an opioid, get medical help right away. This information is not intended to replace advice given to you by your health care provider. Make sure you discuss any questions you have with your health care provider. Document Revised: 03/31/2021 Document Reviewed: 12/16/2020 Elsevier Patient Education  2023 Elsevier Inc.   Advanced directives: Advance directive discussed with you today. Even though you declined this today, please call our office should you change your mind, and we can give you the proper paperwork for you to fill out.   Conditions/risks identified: None  Next appointment: Follow up in one year for your annual wellness visit    Preventive Care 40-64 Years, Male Preventive care refers to lifestyle choices and visits with your health care provider that can promote health and wellness. What does preventive care include? A yearly physical exam. This is also called an annual well check. Dental exams once or twice a year. Routine eye exams. Ask your health care provider how often you should have your eyes checked. Personal lifestyle choices, including: Daily care of your teeth and gums. Regular physical activity. Eating a healthy diet. Avoiding tobacco and drug use. Limiting alcohol use. Practicing safe sex. Taking low-dose aspirin every day starting at age 20. What happens during an annual well check? The services and screenings done by your health care provider during your annual well check will depend on your age, overall  health, lifestyle risk factors, and family history of disease. Counseling  Your health care provider may ask you questions about your: Alcohol use. Tobacco use. Drug use. Emotional well-being. Home and relationship well-being. Sexual activity. Eating habits. Work and work Astronomer. Screening  You may have the following tests or measurements: Height, weight, and BMI. Blood pressure. Lipid and cholesterol levels. These may be checked every 5 years, or more frequently if you are over 31 years old. Skin check. Lung cancer screening. You may have this screening every year starting at age 28 if you have a 30-pack-year history of smoking and currently smoke or have quit within the past 15 years. Fecal occult blood test (FOBT) of the stool. You may have this test every year starting at age 81. Flexible sigmoidoscopy or colonoscopy. You may have a sigmoidoscopy every 5 years or a colonoscopy every 10 years starting at age 15. Prostate cancer screening. Recommendations will vary depending on your family history and other risks. Hepatitis C blood test. Hepatitis B blood test. Sexually transmitted disease (STD) testing. Diabetes screening. This  is done by checking your blood sugar (glucose) after you have not eaten for a while (fasting). You may have this done every 1-3 years. Discuss your test results, treatment options, and if necessary, the need for more tests with your health care provider. Vaccines  Your health care provider may recommend certain vaccines, such as: Influenza vaccine. This is recommended every year. Tetanus, diphtheria, and acellular pertussis (Tdap, Td) vaccine. You may need a Td booster every 10 years. Zoster vaccine. You may need this after age 51. Pneumococcal 13-valent conjugate (PCV13) vaccine. You may need this if you have certain conditions and have not been vaccinated. Pneumococcal polysaccharide (PPSV23) vaccine. You may need one or two doses if you smoke  cigarettes or if you have certain conditions. Talk to your health care provider about which screenings and vaccines you need and how often you need them. This information is not intended to replace advice given to you by your health care provider. Make sure you discuss any questions you have with your health care provider. Document Released: 10/02/2015 Document Revised: 05/25/2016 Document Reviewed: 07/07/2015 Elsevier Interactive Patient Education  2017 [employer].  Fall Prevention in the Home Falls can cause injuries. They can happen to people of all ages. There are many things you can do to make your home safe and to help prevent falls. What can I do on the outside of my home? Regularly fix the edges of walkways and driveways and fix any cracks. Remove anything that might make you trip as you walk through a door, such as a raised step or threshold. Trim any bushes or trees on the path to your home. Use bright outdoor lighting. Clear any walking paths of anything that might make someone trip, such as rocks or tools. Regularly check to see if handrails are loose or broken. Make sure that both sides of any steps have handrails. Any raised decks and porches should have guardrails on the edges. Have any leaves, snow, or ice cleared regularly. Use sand or salt on walking paths during winter. Clean up any spills in your garage right away. This includes oil or grease spills. What can I do in the bathroom? Use night lights. Install grab bars by the toilet and in the tub and shower. Do not use towel bars as grab bars. Use non-skid mats or decals in the tub or shower. If you need to sit down in the shower, use a plastic, non-slip stool. Keep the floor dry. Clean up any water that spills on the floor as soon as it happens. Remove soap buildup in the tub or shower regularly. Attach bath mats securely with double-sided non-slip rug tape. Do not have throw rugs and other things on the floor that  can make you trip. What can I do in the bedroom? Use night lights. Make sure that you have a light by your bed that is easy to reach. Do not use any sheets or blankets that are too big for your bed. They should not hang down onto the floor. Have a firm chair that has side arms. You can use this for support while you get dressed. Do not have throw rugs and other things on the floor that can make you trip. What can I do in the kitchen? Clean up any spills right away. Avoid walking on wet floors. Keep items that you use a lot in easy-to-reach places. If you need to reach something above you, use a strong step stool that has a grab bar.  Keep electrical cords out of the way. Do not use floor polish or wax that makes floors slippery. If you must use wax, use non-skid floor wax. Do not have throw rugs and other things on the floor that can make you trip. What can I do with my stairs? Do not leave any items on the stairs. Make sure that there are handrails on both sides of the stairs and use them. Fix handrails that are broken or loose. Make sure that handrails are as long as the stairways. Check any carpeting to make sure that it is firmly attached to the stairs. Fix any carpet that is loose or worn. Avoid having throw rugs at the top or bottom of the stairs. If you do have throw rugs, attach them to the floor with carpet tape. Make sure that you have a light switch at the top of the stairs and the bottom of the stairs. If you do not have them, ask someone to add them for you. What else can I do to help prevent falls? Wear shoes that: Do not have high heels. Have rubber bottoms. Are comfortable and fit you well. Are closed at the toe. Do not wear sandals. If you use a stepladder: Make sure that it is fully opened. Do not climb a closed stepladder. Make sure that both sides of the stepladder are locked into place. Ask someone to hold it for you, if possible. Clearly mark and make sure that you  can see: Any grab bars or handrails. First and last steps. Where the edge of each step is. Use tools that help you move around (mobility aids) if they are needed. These include: Canes. Walkers. Scooters. Crutches. Turn on the lights when you go into a dark area. Replace any light bulbs as soon as they burn out. Set up your furniture so you have a clear path. Avoid moving your furniture around. If any of your floors are uneven, fix them. If there are any pets around you, be aware of where they are. Review your medicines with your doctor. Some medicines can make you feel dizzy. This can increase your chance of falling. Ask your doctor what other things that you can do to help prevent falls. This information is not intended to replace advice given to you by your health care provider. Make sure you discuss any questions you have with your health care provider. Document Released: 07/02/2009 Document Revised: 02/11/2016 Document Reviewed: 10/10/2014 Elsevier Interactive Patient Education  2017 [employer].

## 2022-05-17 NOTE — Progress Notes (Signed)
Subjective:   Michael Payne is a 51 y.o. male who presents for Medicare Annual/Subsequent preventive examination.  Review of Systems    Virtual Visit via Telephone Note  I connected with  Michael Payne on 05/17/22 at  8:15 AM EDT by telephone and verified that I am speaking with the correct person using two identifiers.  Location: Patient: Home Provider: Office Persons participating in the virtual visit: patient/Nurse Health Advisor   I discussed the limitations, risks, security and privacy concerns of performing an evaluation and management service by telephone and the availability of in person appointments. The patient expressed understanding and agreed to proceed.  Interactive audio and video telecommunications were attempted between this nurse and patient, however failed, due to patient having technical difficulties OR patient did not have access to video capability.  We continued and completed visit with audio only.  Some vital signs may be absent or patient reported.   Tillie Rung, LPN  Cardiac Risk Factors include: advanced age (>79men, >86 women);hypertension;male gender;sedentary lifestyle     Objective:    Today's Vitals   05/17/22 0814  Height: 6\' 4"  (1.93 m)   Body mass index is 72.46 kg/m.     05/17/2022    8:40 AM 05/31/2021    3:17 PM 04/06/2021   10:31 AM 02/11/2021    1:14 PM 03/24/2020    8:16 AM 12/27/2019   11:30 PM 07/24/2015    3:22 PM  Advanced Directives  Does Patient Have a Medical Advance Directive? No No No No No No No  Does patient want to make changes to medical advance directive? No - Patient declined  No - Patient declined      Would patient like information on creating a medical advance directive? No - Patient declined No - Patient declined  Yes (MAU/Ambulatory/Procedural Areas - Information given) No - Patient declined No - Patient declined No - patient declined information    Current Medications (verified) Outpatient Encounter  Medications as of 05/17/2022  Medication Sig   acetaminophen (TYLENOL) 325 MG tablet Take 2 tablets (650 mg total) by mouth every 6 (six) hours as needed for headache or mild pain.   allopurinol (ZYLOPRIM) 100 MG tablet Take 1 tablet (100 mg total) by mouth every other day.   amLODipine (NORVASC) 10 MG tablet Take 1 tablet (10 mg total) by mouth daily.   carvedilol (COREG) 3.125 MG tablet TAKE 1 TABLET BY MOUTH TWICE A DAY WITH A MEAL   celecoxib (CELEBREX) 100 MG capsule Take 1 capsule (100 mg total) by mouth 2 (two) times daily.   Colchicine (MITIGARE) 0.6 MG CAPS Take 0.5 tablets by mouth daily as needed (for gout.  okay to fill with mitigare).   Dulaglutide (TRULICITY) 3 MG/0.5ML SOPN INJECT 3 MG AS DIRECTED ONCE A WEEK   furosemide (LASIX) 40 MG tablet TAKE 1 TABLET EVERY DAY   Multiple Vitamins-Minerals (MULTIVITAMIN WITH MINERALS) tablet Take 1 tablet by mouth daily.   naloxone (NARCAN) nasal spray 4 mg/0.1 mL Spray nostril if needed for opioid overuse- decreased consciousness, decreased respirations   oxyCODONE-acetaminophen (PERCOCET) 10-325 MG tablet Take 1 tablet by mouth every 6 (six) hours as needed for pain.   potassium chloride (KLOR-CON M10) 10 MEQ tablet Take 1 tablet (10 mEq total) by mouth daily.   vitamin C (ASCORBIC ACID) 500 MG tablet Take 500 mg by mouth daily.   No facility-administered encounter medications on file as of 05/17/2022.    Allergies (verified) Aspirin, Lactose intolerance (  gi), and Lisinopril   History: Past Medical History:  Diagnosis Date   Anemia    borderline anemia, was instructed to eat more iron filled foods   GERD (gastroesophageal reflux disease)    lactose intolerance   Hip pain    HTN (hypertension)    Obesity    Sleep apnea    Past Surgical History:  Procedure Laterality Date   FOOT SURGERY     KNEE ARTHROSCOPY WITH MEDIAL MENISECTOMY Left 12/16/2014   Procedure: KNEE ARTHROSCOPY WITH MEDIAL MENISECTOMY;  Surgeon: Sheral Apley,  MD;  Location: [facility] OR;  Service: Orthopedics;  Laterality: Left;   Family History  Problem Relation Age of Onset   Heart attack Mother 26   Lung cancer Father    Cancer Father    CAD Maternal Grandfather    Social History   Socioeconomic History   Marital status: Legally Separated    Spouse name: Not on file   Number of children: 2   Years of education: Not on file   Highest education level: Not on file  Occupational History   Occupation: disabled  Tobacco Use   Smoking status: Former    Packs/day: 2.00    Years: 22.00    Total pack years: 44.00    Types: Cigarettes    Quit date: 02/24/2011    Years since quitting: 11.2   Smokeless tobacco: Never  Vaping Use   Vaping Use: Never used  Substance and Sexual Activity   Alcohol use: Not Currently    Comment: Weekends/beer-- has slowed down  alot   Drug use: Not Currently    Types: Marijuana    Comment: former -  quit in 2012   Sexual activity: Yes    Partners: Female  Other Topics Concern   Not on file  Social History Narrative   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   Former Hydrologist.   Married.   Social Determinants of Health   Financial Resource Strain: Low Risk  (05/17/2022)   Overall Financial Resource Strain (CARDIA)    Difficulty of Paying Living Expenses: Not hard at all  Food Insecurity: No Food Insecurity (05/17/2022)   Hunger Vital Sign    Worried About Running Out of Food in the Last Year: Never true    Ran Out of Food in the Last Year: Never true  Transportation Needs: No Transportation Needs (05/17/2022)   PRAPARE - Administrator, Civil Service (Medical): No    Lack of Transportation (Non-Medical): No  Physical Activity: Sufficiently Active (05/17/2022)   Exercise Vital Sign    Days of Exercise per Week: 6 days    Minutes of Exercise per Session: 90 min  Stress: Stress Concern Present (05/17/2022)   [employer] of Occupational Health - Occupational Stress  Questionnaire    Feeling of Stress : To some extent  Social Connections: Moderately Integrated (05/17/2022)   Social Connection and Isolation Panel [NHANES]    Frequency of Communication with Friends and Family: More than three times a week    Frequency of Social Gatherings with Friends and Family: More than three times a week    Attends Religious Services: More than 4 times per year    Active Member of [employer] or Organizations: Yes    Attends Engineer, structural: More than 4 times per year    Marital Status: Separated     Clinical Intake:  Pre-visit preparation completed: No  Pain : No/denies pain  Nutritional Status: BMI > 30  Obese Nutritional Risks: None Diabetes: No  How often do you need to have someone help you when you read instructions, pamphlets, or other written materials from your doctor or pharmacy?: 1 - Never  Diabetic?  No  Interpreter Needed?: No  Information entered by :: Theresa Mulligan LPN   Activities of Daily Living    05/17/2022    8:31 AM 06/02/2021    9:00 PM  In your present state of health, do you have any difficulty performing the following activities:  Hearing? 0 0  Vision? 0 0  Difficulty concentrating or making decisions? 0 0  Walking or climbing stairs? 1 1  Comment Uses Cane, Walker and wheelchair   Dressing or bathing? 1 1  Doing errands, shopping? 1 1  Comment [place] [employer] and eating ? Y   Comment Aide assist. patient feeds self   Using the Toilet? N   In the past six months, have you accidently leaked urine? N   Comment Uses urnial   Do you have problems with loss of bowel control? N   Comment Uses bed pan   Managing your Medications? N   Managing your Finances? N   Housekeeping or managing your Housekeeping? Y   Comment Aide Assist     Patient Care Team: Joaquim Nam, MD as PCP - General (Family Medicine) Otho Ket, RN as Case Manager Coralyn Helling, MD as  Consulting Physician (Pulmonary Disease) [name], Hyman Hopes, MD as Consulting Physician (Surgery) Kathyrn Sheriff, [facility] as Pharmacist (Pharmacist)  Indicate any recent Medical Services you may have received from other than [facility] providers in the past year (date may be approximate).     Assessment:   This is a routine wellness examination for Michael Payne.  Hearing/Vision screen Hearing Screening - Comments:: Denies hearing difficulties   Vision Screening - Comments:: Wears rx glasses - Followed by Ferrel Logan  Dietary issues and exercise activities discussed: Current Exercise Habits: Home exercise routine, Type of exercise: strength training/weights, Time (Minutes): > 60, Frequency (Times/Week): 6, Weekly Exercise (Minutes/Week): 0, Intensity: Moderate, Exercise limited by: orthopedic condition(s)   Goals Addressed               This Visit's Progress     Increase physical activity (pt-stated)        Travel and go back to Gym again.       Depression Screen    05/17/2022    8:25 AM 05/15/2021   10:08 AM 04/06/2021   10:33 AM 02/11/2021    1:21 PM 09/02/2020    8:19 AM 07/29/2020    1:15 PM 03/24/2020    8:18 AM  PHQ 2/9 Scores  PHQ - 2 Score 0 0 0 0 0 2 0  PHQ- 9 Score  0 0   6 0    Fall Risk    05/17/2022    8:39 AM 05/15/2021   10:08 AM 04/06/2021   10:33 AM 02/11/2021    1:21 PM 09/02/2020    8:19 AM  Fall Risk   Falls in the past year? 1 0 0 0 0  Number falls in past yr: 0 0 0 0 0  Injury with Fall? 0 0 0  0  Risk for fall due to : No Fall Risks No Fall Risks Medication side effect;Impaired balance/gait;Impaired mobility    Follow up  Falls evaluation completed Falls evaluation completed;Falls prevention discussed  Falls evaluation completed  FALL RISK PREVENTION PERTAINING TO THE HOME:  Any stairs in or around the home? Yes  If so, are there any without handrails? No  Home free of loose throw rugs in walkways, pet beds, electrical cords, etc? Yes  Adequate  lighting in your home to reduce risk of falls? Yes   ASSISTIVE DEVICES UTILIZED TO PREVENT FALLS:  Life alert? Yes  Use of a cane, walker or w/c? Yes  Grab bars in the bathroom? No  Shower chair or bench in shower? No  Elevated toilet seat or a handicapped toilet? No   TIMED UP AND GO:  Was the test performed? No . Audio Visit  Cognitive Function:    04/06/2021   10:38 AM 03/24/2020    8:22 AM  MMSE - Mini Mental State Exam  Not completed: Refused Refused        05/17/2022    8:41 AM 05/15/2021   10:10 AM  6CIT Screen  What Year? 0 points 0 points  What month? 0 points 0 points  What time? 0 points 0 points  Count back from 20 0 points 0 points  Months in reverse 2 points 0 points  Repeat phrase 0 points 0 points  Total Score 2 points 0 points    Immunizations Immunization History  Administered Date(s) Administered   Tdap 02/03/2012    TDAP status: Due, Education has been provided regarding the importance of this vaccine. Advised may receive this vaccine at local pharmacy or Health Dept. Aware to provide a copy of the vaccination record if obtained from local pharmacy or Health Dept. Verbalized acceptance and understanding.  Flu Vaccine status: Declined, Education has been provided regarding the importance of this vaccine but patient still declined. Advised may receive this vaccine at local pharmacy or Health Dept. Aware to provide a copy of the vaccination record if obtained from local pharmacy or Health Dept. Verbalized acceptance and understanding.    Covid-19 vaccine status: Information provided on how to obtain vaccines.   Qualifies for Shingles Vaccine? Yes   Zostavax completed No   Shingrix Completed?: No.    Education has been provided regarding the importance of this vaccine. Patient has been advised to call insurance company to determine out of pocket expense if they have not yet received this vaccine. Advised may also receive vaccine at local pharmacy or  Health Dept. Verbalized acceptance and understanding.  Screening Tests Health Maintenance  Topic Date Due   FOOT EXAM  Never done   URINE MICROALBUMIN  Never done   Hepatitis C Screening  Never done   COLONOSCOPY (Pts 45-46yrs Insurance coverage will need to be confirmed)  Never done   HEMOGLOBIN A1C  11/29/2021   Zoster Vaccines- Shingrix (1 of 2) 08/17/2022 (Originally 09/20/2021)   TETANUS/TDAP  05/18/2023 (Originally 02/02/2022)   OPHTHALMOLOGY EXAM  11/23/2022   HIV Screening  Completed   HPV VACCINES  Aged Out   INFLUENZA VACCINE  Discontinued   COVID-19 Vaccine  Discontinued    Health Maintenance  Health Maintenance Due  Topic Date Due   FOOT EXAM  Never done   URINE MICROALBUMIN  Never done   Hepatitis C Screening  Never done   COLONOSCOPY (Pts 45-46yrs Insurance coverage will need to be confirmed)  Never done   HEMOGLOBIN A1C  11/29/2021    Colorectal cancer screening: Referral to GI placed 05/17/22. Pt aware the office will call re: appt.  Lung Cancer Screening: (Low Dose CT Chest recommended if Age 6-80 years, 30 pack-year  currently smoking OR have quit w/in 15years.) does not qualify.     Additional Screening:  Hepatitis C Screening: does qualify; Completed Patient deferred  Vision Screening: Recommended annual ophthalmology exams for early detection of glaucoma and other disorders of the eye. Is the patient up to date with their annual eye exam?  No  Who is the provider or what is the name of the office in which the patient attends annual eye exams? [facility] If pt is not established with a provider, would they like to be referred to a provider to establish care? No .   Dental Screening: Recommended annual dental exams for proper oral hygiene  Community Resource Referral / Chronic Care Management:  CRR required this visit?  No   CCM required this visit?  No      Plan:     I have personally reviewed and noted the following in the patient's chart:    Medical and social history Use of alcohol, tobacco or illicit drugs  Current medications and supplements including opioid prescriptions. Patient is currently taking opioid prescriptions. Information provided to patient regarding non-opioid alternatives. Patient advised to discuss non-opioid treatment plan with their provider. Functional ability and status Nutritional status Physical activity Advanced directives List of other physicians Hospitalizations, surgeries, and ER visits in previous 12 months Vitals Screenings to include cognitive, depression, and falls Referrals and appointments  In addition, I have reviewed and discussed with patient certain preventive protocols, quality metrics, and best practice recommendations. A written personalized care plan for preventive services as well as general preventive health recommendations were provided to patient.     Tillie Rung, LPN   1/61/0960   Nurse Notes:  Patient due  lab Hep-C Screening and Urine Mircoalbumin

## 2022-05-18 DIAGNOSIS — I10 Essential (primary) hypertension: Secondary | ICD-10-CM | POA: Diagnosis not present

## 2022-05-19 ENCOUNTER — Other Ambulatory Visit: Payer: Self-pay | Admitting: Surgery

## 2022-05-19 ENCOUNTER — Ambulatory Visit: Payer: Self-pay | Admitting: Surgery

## 2022-05-19 ENCOUNTER — Telehealth: Payer: Medicare HMO

## 2022-05-19 DIAGNOSIS — M1611 Unilateral primary osteoarthritis, right hip: Secondary | ICD-10-CM | POA: Diagnosis not present

## 2022-05-19 DIAGNOSIS — J9621 Acute and chronic respiratory failure with hypoxia: Secondary | ICD-10-CM | POA: Diagnosis not present

## 2022-05-19 DIAGNOSIS — I5031 Acute diastolic (congestive) heart failure: Secondary | ICD-10-CM | POA: Diagnosis not present

## 2022-05-19 DIAGNOSIS — Z01818 Encounter for other preprocedural examination: Secondary | ICD-10-CM

## 2022-05-19 DIAGNOSIS — D649 Anemia, unspecified: Secondary | ICD-10-CM | POA: Diagnosis not present

## 2022-05-19 DIAGNOSIS — I110 Hypertensive heart disease with heart failure: Secondary | ICD-10-CM | POA: Diagnosis not present

## 2022-05-19 DIAGNOSIS — F331 Major depressive disorder, recurrent, moderate: Secondary | ICD-10-CM | POA: Diagnosis not present

## 2022-05-19 DIAGNOSIS — E119 Type 2 diabetes mellitus without complications: Secondary | ICD-10-CM | POA: Diagnosis not present

## 2022-05-19 DIAGNOSIS — J9622 Acute and chronic respiratory failure with hypercapnia: Secondary | ICD-10-CM | POA: Diagnosis not present

## 2022-05-19 DIAGNOSIS — I10 Essential (primary) hypertension: Secondary | ICD-10-CM | POA: Diagnosis not present

## 2022-05-19 DIAGNOSIS — F419 Anxiety disorder, unspecified: Secondary | ICD-10-CM | POA: Diagnosis not present

## 2022-05-19 DIAGNOSIS — M109 Gout, unspecified: Secondary | ICD-10-CM | POA: Diagnosis not present

## 2022-05-19 DIAGNOSIS — G4733 Obstructive sleep apnea (adult) (pediatric): Secondary | ICD-10-CM | POA: Diagnosis not present

## 2022-05-20 DIAGNOSIS — G4733 Obstructive sleep apnea (adult) (pediatric): Secondary | ICD-10-CM | POA: Diagnosis not present

## 2022-05-20 DIAGNOSIS — I10 Essential (primary) hypertension: Secondary | ICD-10-CM | POA: Diagnosis not present

## 2022-05-20 DIAGNOSIS — M109 Gout, unspecified: Secondary | ICD-10-CM | POA: Diagnosis not present

## 2022-05-20 DIAGNOSIS — M1611 Unilateral primary osteoarthritis, right hip: Secondary | ICD-10-CM | POA: Diagnosis not present

## 2022-05-20 DIAGNOSIS — J9621 Acute and chronic respiratory failure with hypoxia: Secondary | ICD-10-CM | POA: Diagnosis not present

## 2022-05-20 DIAGNOSIS — J9622 Acute and chronic respiratory failure with hypercapnia: Secondary | ICD-10-CM | POA: Diagnosis not present

## 2022-05-20 DIAGNOSIS — D649 Anemia, unspecified: Secondary | ICD-10-CM | POA: Diagnosis not present

## 2022-05-20 DIAGNOSIS — I110 Hypertensive heart disease with heart failure: Secondary | ICD-10-CM | POA: Diagnosis not present

## 2022-05-20 DIAGNOSIS — R32 Unspecified urinary incontinence: Secondary | ICD-10-CM | POA: Diagnosis not present

## 2022-05-20 DIAGNOSIS — E119 Type 2 diabetes mellitus without complications: Secondary | ICD-10-CM | POA: Diagnosis not present

## 2022-05-20 DIAGNOSIS — I5031 Acute diastolic (congestive) heart failure: Secondary | ICD-10-CM | POA: Diagnosis not present

## 2022-05-20 NOTE — Telephone Encounter (Signed)
This encounter was created in error - please disregard.

## 2022-05-21 DIAGNOSIS — I10 Essential (primary) hypertension: Secondary | ICD-10-CM | POA: Diagnosis not present

## 2022-05-22 DIAGNOSIS — I10 Essential (primary) hypertension: Secondary | ICD-10-CM | POA: Diagnosis not present

## 2022-05-24 DIAGNOSIS — M1611 Unilateral primary osteoarthritis, right hip: Secondary | ICD-10-CM | POA: Diagnosis not present

## 2022-05-24 DIAGNOSIS — F331 Major depressive disorder, recurrent, moderate: Secondary | ICD-10-CM | POA: Diagnosis not present

## 2022-05-24 DIAGNOSIS — F419 Anxiety disorder, unspecified: Secondary | ICD-10-CM | POA: Diagnosis not present

## 2022-05-24 DIAGNOSIS — I5031 Acute diastolic (congestive) heart failure: Secondary | ICD-10-CM | POA: Diagnosis not present

## 2022-05-24 DIAGNOSIS — D649 Anemia, unspecified: Secondary | ICD-10-CM | POA: Diagnosis not present

## 2022-05-24 DIAGNOSIS — E119 Type 2 diabetes mellitus without complications: Secondary | ICD-10-CM | POA: Diagnosis not present

## 2022-05-24 DIAGNOSIS — J9621 Acute and chronic respiratory failure with hypoxia: Secondary | ICD-10-CM | POA: Diagnosis not present

## 2022-05-24 DIAGNOSIS — I10 Essential (primary) hypertension: Secondary | ICD-10-CM | POA: Diagnosis not present

## 2022-05-24 DIAGNOSIS — G4733 Obstructive sleep apnea (adult) (pediatric): Secondary | ICD-10-CM | POA: Diagnosis not present

## 2022-05-24 DIAGNOSIS — I110 Hypertensive heart disease with heart failure: Secondary | ICD-10-CM | POA: Diagnosis not present

## 2022-05-24 DIAGNOSIS — M109 Gout, unspecified: Secondary | ICD-10-CM | POA: Diagnosis not present

## 2022-05-24 DIAGNOSIS — J9622 Acute and chronic respiratory failure with hypercapnia: Secondary | ICD-10-CM | POA: Diagnosis not present

## 2022-05-25 ENCOUNTER — Other Ambulatory Visit: Payer: Self-pay | Admitting: Family Medicine

## 2022-05-25 DIAGNOSIS — I10 Essential (primary) hypertension: Secondary | ICD-10-CM | POA: Diagnosis not present

## 2022-05-25 MED ORDER — OXYCODONE-ACETAMINOPHEN 10-325 MG PO TABS: 1.0000 | ORAL_TABLET | Freq: Four times a day (QID) | ORAL | 0 refills | Status: DC | PRN

## 2022-05-25 NOTE — Telephone Encounter (Signed)
  Encourage patient to contact the pharmacy for refills or they can request refills through [facility]  Did the patient contact the pharmacy: y    LAST APPOINTMENT DATE:  05/11/22  NEXT APPOINTMENT DATE:  MEDICATION:oxyCODONE-acetaminophen (PERCOCET) 10-325 MG tablet  Is the patient out of medication? n  If not, how much is left?2 left  Is this a 90 day supply: 120  PHARMACY: [id number] [place], [place] - [address] Phone:  [phone]  Fax:  [phone]      Let patient know to contact pharmacy at the end of the day to make sure medication is ready.  Please notify patient to allow 48-72 hours to process

## 2022-05-25 NOTE — Telephone Encounter (Signed)
Refill request Oxycodone Last refill 05/10/22 video visit Last refill 04/25/22 #120 No upcoming appointment scheduled

## 2022-05-25 NOTE — Telephone Encounter (Signed)
Sent. Thanks.   

## 2022-05-26 ENCOUNTER — Ambulatory Visit: Payer: Self-pay

## 2022-05-26 DIAGNOSIS — G4733 Obstructive sleep apnea (adult) (pediatric): Secondary | ICD-10-CM | POA: Diagnosis not present

## 2022-05-26 DIAGNOSIS — J9610 Chronic respiratory failure, unspecified whether with hypoxia or hypercapnia: Secondary | ICD-10-CM | POA: Diagnosis not present

## 2022-05-26 DIAGNOSIS — J9622 Acute and chronic respiratory failure with hypercapnia: Secondary | ICD-10-CM | POA: Diagnosis not present

## 2022-05-26 DIAGNOSIS — D649 Anemia, unspecified: Secondary | ICD-10-CM | POA: Diagnosis not present

## 2022-05-26 DIAGNOSIS — J9621 Acute and chronic respiratory failure with hypoxia: Secondary | ICD-10-CM | POA: Diagnosis not present

## 2022-05-26 DIAGNOSIS — M1611 Unilateral primary osteoarthritis, right hip: Secondary | ICD-10-CM | POA: Diagnosis not present

## 2022-05-26 DIAGNOSIS — M109 Gout, unspecified: Secondary | ICD-10-CM | POA: Diagnosis not present

## 2022-05-26 DIAGNOSIS — I110 Hypertensive heart disease with heart failure: Secondary | ICD-10-CM | POA: Diagnosis not present

## 2022-05-26 DIAGNOSIS — E119 Type 2 diabetes mellitus without complications: Secondary | ICD-10-CM | POA: Diagnosis not present

## 2022-05-26 DIAGNOSIS — I10 Essential (primary) hypertension: Secondary | ICD-10-CM | POA: Diagnosis not present

## 2022-05-26 DIAGNOSIS — I5031 Acute diastolic (congestive) heart failure: Secondary | ICD-10-CM | POA: Diagnosis not present

## 2022-05-26 NOTE — Patient Outreach (Signed)
  Care Coordination   05/26/2022 Name: Michael Payne MRN: [id number] DOB: Jul 24, 1971   Care Coordination Outreach Attempts:  An unsuccessful telephone outreach was attempted for a scheduled appointment today. Attempted call x 3 times phone only rang then hung up.   Follow Up Plan:  Additional outreach attempts will be made to offer the patient care coordination information and services.   Encounter Outcome:  No Answer  Care Coordination Interventions Activated:  No   Care Coordination Interventions:  No, not indicated    George Ina RN,BSN,CCM RN Care Manager Coordinator [phone]

## 2022-05-27 DIAGNOSIS — I10 Essential (primary) hypertension: Secondary | ICD-10-CM | POA: Diagnosis not present

## 2022-05-27 DIAGNOSIS — J9621 Acute and chronic respiratory failure with hypoxia: Secondary | ICD-10-CM | POA: Diagnosis not present

## 2022-05-27 DIAGNOSIS — J9622 Acute and chronic respiratory failure with hypercapnia: Secondary | ICD-10-CM | POA: Diagnosis not present

## 2022-05-27 DIAGNOSIS — M1611 Unilateral primary osteoarthritis, right hip: Secondary | ICD-10-CM | POA: Diagnosis not present

## 2022-05-27 DIAGNOSIS — M109 Gout, unspecified: Secondary | ICD-10-CM | POA: Diagnosis not present

## 2022-05-27 DIAGNOSIS — E119 Type 2 diabetes mellitus without complications: Secondary | ICD-10-CM | POA: Diagnosis not present

## 2022-05-27 DIAGNOSIS — I5031 Acute diastolic (congestive) heart failure: Secondary | ICD-10-CM | POA: Diagnosis not present

## 2022-05-27 DIAGNOSIS — D649 Anemia, unspecified: Secondary | ICD-10-CM | POA: Diagnosis not present

## 2022-05-27 DIAGNOSIS — G4733 Obstructive sleep apnea (adult) (pediatric): Secondary | ICD-10-CM | POA: Diagnosis not present

## 2022-05-27 DIAGNOSIS — I110 Hypertensive heart disease with heart failure: Secondary | ICD-10-CM | POA: Diagnosis not present

## 2022-05-28 DIAGNOSIS — I10 Essential (primary) hypertension: Secondary | ICD-10-CM | POA: Diagnosis not present

## 2022-05-29 DIAGNOSIS — I10 Essential (primary) hypertension: Secondary | ICD-10-CM | POA: Diagnosis not present

## 2022-05-30 ENCOUNTER — Telehealth: Payer: Self-pay

## 2022-05-30 DIAGNOSIS — I10 Essential (primary) hypertension: Secondary | ICD-10-CM | POA: Diagnosis not present

## 2022-05-30 NOTE — Addendum Note (Signed)
Addended by: Joaquim Nam on: 05/30/2022 01:39 PM   Modules accepted: Orders

## 2022-05-30 NOTE — Telephone Encounter (Signed)
I put in a new referral  Thanks

## 2022-05-30 NOTE — Telephone Encounter (Signed)
April from [place] [facility] called and is trying to get patients surgery approved by his insurance. They are requiring them to get a new referral from [place] in order to continue with his authorization process. Can we send them a new referral? Also April said that patient is scheduled to have his xray done on Friday.

## 2022-05-30 NOTE — Telephone Encounter (Signed)
I have let Michael Payne at [facility] [place] [facility] know referral was done.

## 2022-05-31 ENCOUNTER — Telehealth: Payer: Self-pay | Admitting: *Deleted

## 2022-05-31 DIAGNOSIS — M1611 Unilateral primary osteoarthritis, right hip: Secondary | ICD-10-CM | POA: Diagnosis not present

## 2022-05-31 DIAGNOSIS — J9622 Acute and chronic respiratory failure with hypercapnia: Secondary | ICD-10-CM | POA: Diagnosis not present

## 2022-05-31 DIAGNOSIS — M109 Gout, unspecified: Secondary | ICD-10-CM | POA: Diagnosis not present

## 2022-05-31 DIAGNOSIS — J9621 Acute and chronic respiratory failure with hypoxia: Secondary | ICD-10-CM | POA: Diagnosis not present

## 2022-05-31 DIAGNOSIS — I5031 Acute diastolic (congestive) heart failure: Secondary | ICD-10-CM | POA: Diagnosis not present

## 2022-05-31 DIAGNOSIS — I110 Hypertensive heart disease with heart failure: Secondary | ICD-10-CM | POA: Diagnosis not present

## 2022-05-31 DIAGNOSIS — D649 Anemia, unspecified: Secondary | ICD-10-CM | POA: Diagnosis not present

## 2022-05-31 DIAGNOSIS — I10 Essential (primary) hypertension: Secondary | ICD-10-CM | POA: Diagnosis not present

## 2022-05-31 DIAGNOSIS — G4733 Obstructive sleep apnea (adult) (pediatric): Secondary | ICD-10-CM | POA: Diagnosis not present

## 2022-05-31 DIAGNOSIS — E119 Type 2 diabetes mellitus without complications: Secondary | ICD-10-CM | POA: Diagnosis not present

## 2022-05-31 NOTE — Patient Outreach (Signed)
CSW received call from April at [facility] requesting help with stretcher ride to and from [facility] Pre-op this Friday. CSW contacted [employer] and was advised pt has zero copay and the coverage is the same in-network and out of network. As well, [employer] Rep indicates no prior-auth is required.  CSW has updated April at [facility] as well as pt.   Reece Levy MSW, LCSW Licensed Clinical Social Worker      [phone]

## 2022-06-01 DIAGNOSIS — I5031 Acute diastolic (congestive) heart failure: Secondary | ICD-10-CM | POA: Diagnosis not present

## 2022-06-01 DIAGNOSIS — M1611 Unilateral primary osteoarthritis, right hip: Secondary | ICD-10-CM | POA: Diagnosis not present

## 2022-06-01 DIAGNOSIS — I10 Essential (primary) hypertension: Secondary | ICD-10-CM | POA: Diagnosis not present

## 2022-06-01 DIAGNOSIS — I110 Hypertensive heart disease with heart failure: Secondary | ICD-10-CM | POA: Diagnosis not present

## 2022-06-01 DIAGNOSIS — E119 Type 2 diabetes mellitus without complications: Secondary | ICD-10-CM | POA: Diagnosis not present

## 2022-06-01 DIAGNOSIS — M109 Gout, unspecified: Secondary | ICD-10-CM | POA: Diagnosis not present

## 2022-06-01 DIAGNOSIS — D649 Anemia, unspecified: Secondary | ICD-10-CM | POA: Diagnosis not present

## 2022-06-01 DIAGNOSIS — J9622 Acute and chronic respiratory failure with hypercapnia: Secondary | ICD-10-CM | POA: Diagnosis not present

## 2022-06-01 DIAGNOSIS — J9621 Acute and chronic respiratory failure with hypoxia: Secondary | ICD-10-CM | POA: Diagnosis not present

## 2022-06-01 DIAGNOSIS — G4733 Obstructive sleep apnea (adult) (pediatric): Secondary | ICD-10-CM | POA: Diagnosis not present

## 2022-06-02 ENCOUNTER — Ambulatory Visit: Payer: Self-pay

## 2022-06-02 DIAGNOSIS — I10 Essential (primary) hypertension: Secondary | ICD-10-CM | POA: Diagnosis not present

## 2022-06-02 DIAGNOSIS — I110 Hypertensive heart disease with heart failure: Secondary | ICD-10-CM | POA: Diagnosis not present

## 2022-06-02 DIAGNOSIS — M109 Gout, unspecified: Secondary | ICD-10-CM | POA: Diagnosis not present

## 2022-06-02 DIAGNOSIS — J9621 Acute and chronic respiratory failure with hypoxia: Secondary | ICD-10-CM | POA: Diagnosis not present

## 2022-06-02 DIAGNOSIS — G4733 Obstructive sleep apnea (adult) (pediatric): Secondary | ICD-10-CM | POA: Diagnosis not present

## 2022-06-02 DIAGNOSIS — J9622 Acute and chronic respiratory failure with hypercapnia: Secondary | ICD-10-CM | POA: Diagnosis not present

## 2022-06-02 DIAGNOSIS — E119 Type 2 diabetes mellitus without complications: Secondary | ICD-10-CM | POA: Diagnosis not present

## 2022-06-02 DIAGNOSIS — D649 Anemia, unspecified: Secondary | ICD-10-CM | POA: Diagnosis not present

## 2022-06-02 DIAGNOSIS — M1611 Unilateral primary osteoarthritis, right hip: Secondary | ICD-10-CM | POA: Diagnosis not present

## 2022-06-02 DIAGNOSIS — I5031 Acute diastolic (congestive) heart failure: Secondary | ICD-10-CM | POA: Diagnosis not present

## 2022-06-02 NOTE — Patient Instructions (Signed)
Visit Information  Thank you for taking time to visit with me today. Please don't hesitate to contact me if I can be of assistance to you.   Following are the goals we discussed today:   Goals Addressed             This Visit's Progress    Patient Stated:  Ongoing weight loss and to have bariatric surgery       Care Coordination Interventions: Evaluation of current treatment plan related to bariatric surgery and patient's adherence to plan as established by provider Reviewed scheduled/upcoming provider appointments  Patient provided with contact phone number for [facility]).  Advised to call and confirm transport arrangements for 06/03/22 preop visit.  Active listening and emotional support provided. Discussed post bariatric surgery arrangements.          Our next appointment is by telephone on July 14, 2022  at 3pm  Please call the care guide team at [phone] if you need to cancel or reschedule your appointment.   If you are experiencing a Mental Health or Behavioral Health Crisis or need someone to talk to, please call 1-800-273-TALK (toll free, 24 hour hotline)  Patient verbalizes understanding of instructions and care plan provided today and agrees to view in MyChart. Active MyChart status and patient understanding of how to access instructions and care plan via MyChart confirmed with patient.     George Ina RN,BSN,CCM [facility] Care Coordination [phone] direct line

## 2022-06-02 NOTE — Patient Outreach (Signed)
  Care Coordination   Follow Up Visit Note   06/02/2022 Name: Michael Payne MRN: [id number] DOB: 07-07-1971  Michael Payne is a 51 y.o. year old male who sees Joaquim Nam, MD for primary care. I spoke with  Michael Payne by phone today.  What matters to the patients health and wellness today?  Patient states he feels good about his ongoing weight loss.  Per chart review last patient weight was 450 lbs down from 625 lbs.  Congratulated patient on his weight loss and hard work.  Patient states his bariatric surgery is scheduled for June 20, 2022. He states he is scheduled for his Preop appointment on tomorrow.  Patient states he has not heard from [facility] regarding his transportation arrangements for tomorrows Preop appointment.  He states he has recently changed his phone number and is concerned [facility] may not have his new number.  Patient expressed some apprehension about his upcoming surgery but states overall he is ready to have it.  Patient states his primary provider is also requesting an xray of his right shoulder at his Preop visit  due to having ongoing pain.     Goals Addressed             This Visit's Progress    Patient Stated:  Ongoing weight loss and to have bariatric surgery       Care Coordination Interventions: Evaluation of current treatment plan related to bariatric surgery and patient's adherence to plan as established by provider Reviewed scheduled/upcoming provider appointments  Patient provided with contact phone number for [facility]).  Advised to call and confirm transport arrangements for 06/03/22 preop visit.  Active listening and emotional support provided. Discussed post bariatric surgery arrangements.          SDOH assessments and interventions completed:  No     Care Coordination Interventions Activated:  Yes  Care Coordination Interventions:  Yes, provided   Follow up plan: Follow up call scheduled for July 14, 2022 at 3:00 pm    Encounter Outcome:  Pt. Visit Completed   George Ina RN,BSN,CCM [facility] Care Coordination [phone] direct line

## 2022-06-03 ENCOUNTER — Encounter (HOSPITAL_COMMUNITY)
Admission: RE | Admit: 2022-06-03 | Discharge: 2022-06-03 | Disposition: A | Discharge status: Home / Self Care | Payer: Medicare HMO | Source: Ambulatory Visit | Attending: General Surgery | Admitting: General Surgery

## 2022-06-03 ENCOUNTER — Encounter (HOSPITAL_COMMUNITY): Payer: Self-pay | Admitting: Physician Assistant

## 2022-06-03 ENCOUNTER — Inpatient Hospital Stay (HOSPITAL_COMMUNITY): Admission: RE | Admit: 2022-06-03 | Payer: Medicare HMO | Source: Ambulatory Visit

## 2022-06-03 ENCOUNTER — Ambulatory Visit (HOSPITAL_COMMUNITY)
Admission: RE | Admit: 2022-06-03 | Discharge: 2022-06-03 | Disposition: A | Discharge status: Home / Self Care | Payer: Medicare HMO | Source: Ambulatory Visit | Attending: Family Medicine | Admitting: Family Medicine

## 2022-06-03 ENCOUNTER — Encounter (HOSPITAL_COMMUNITY): Payer: Self-pay | Admitting: Anesthesiology

## 2022-06-03 ENCOUNTER — Ambulatory Visit (HOSPITAL_COMMUNITY)
Admission: RE | Admit: 2022-06-03 | Discharge: 2022-06-03 | Disposition: A | Discharge status: Home / Self Care | Payer: Medicare HMO | Source: Ambulatory Visit | Attending: Physician Assistant | Admitting: Physician Assistant

## 2022-06-03 VITALS — Ht 76.0 in | Wt >= 6400 oz

## 2022-06-03 DIAGNOSIS — Z7401 Bed confinement status: Secondary | ICD-10-CM | POA: Diagnosis not present

## 2022-06-03 DIAGNOSIS — E119 Type 2 diabetes mellitus without complications: Secondary | ICD-10-CM | POA: Diagnosis not present

## 2022-06-03 DIAGNOSIS — Z01818 Encounter for other preprocedural examination: Secondary | ICD-10-CM | POA: Diagnosis not present

## 2022-06-03 DIAGNOSIS — M25511 Pain in right shoulder: Secondary | ICD-10-CM | POA: Diagnosis not present

## 2022-06-03 DIAGNOSIS — R531 Weakness: Secondary | ICD-10-CM | POA: Diagnosis not present

## 2022-06-03 DIAGNOSIS — I517 Cardiomegaly: Secondary | ICD-10-CM

## 2022-06-03 DIAGNOSIS — I10 Essential (primary) hypertension: Secondary | ICD-10-CM | POA: Diagnosis not present

## 2022-06-03 HISTORY — DX: Cardiomegaly: I51.7

## 2022-06-03 LAB — COMPREHENSIVE METABOLIC PANEL
ALT: 27 U/L (ref 0–44)
AST: 20 U/L (ref 15–41)
Albumin: 3.8 g/dL (ref 3.5–5.0)
Alkaline Phosphatase: 61 U/L (ref 38–126)
Anion gap: 5 (ref 5–15)
BUN: 13 mg/dL (ref 6–20)
CO2: 30 mmol/L (ref 22–32)
Calcium: 9.1 mg/dL (ref 8.9–10.3)
Chloride: 106 mmol/L (ref 98–111)
Creatinine, Ser: 0.78 mg/dL (ref 0.61–1.24)
GFR, Estimated: >60 mL/min (ref >60)
Glucose, Bld: 106 mg/dL — ABNORMAL HIGH (ref 70–99)
Potassium: 4.1 mmol/L (ref 3.5–5.1)
Sodium: 141 mmol/L (ref 135–145)
Total Bilirubin: 0.6 mg/dL (ref 0.3–1.2)
Total Protein: 8 g/dL (ref 6.5–8.1)

## 2022-06-03 LAB — CBC WITH DIFFERENTIAL/PLATELET
Abs Immature Granulocytes: 0.02 10*3/uL (ref 0.00–0.07)
Basophils Absolute: 0 10*3/uL (ref 0.0–0.1)
Basophils Relative: 1 %
Eosinophils Absolute: 0.1 10*3/uL (ref 0.0–0.5)
Eosinophils Relative: 2 %
HCT: 43.4 % (ref 39.0–52.0)
Hemoglobin: 13.7 g/dL (ref 13.0–17.0)
Immature Granulocytes: 0 %
Lymphocytes Relative: 30 %
Lymphs Abs: 2.2 10*3/uL (ref 0.7–4.0)
MCH: 30.3 pg (ref 26.0–34.0)
MCHC: 31.6 g/dL (ref 30.0–36.0)
MCV: 96 fL (ref 80.0–100.0)
Monocytes Absolute: 0.6 10*3/uL (ref 0.1–1.0)
Monocytes Relative: 9 %
Neutro Abs: 4.2 10*3/uL (ref 1.7–7.7)
Neutrophils Relative %: 58 %
Platelets: 243 10*3/uL (ref 150–400)
RBC: 4.52 MIL/uL (ref 4.22–5.81)
RDW: 14.3 % (ref 11.5–15.5)
WBC: 7.3 10*3/uL (ref 4.0–10.5)
nRBC: 0 % (ref 0.0–0.2)

## 2022-06-03 LAB — HEMOGLOBIN A1C
Hgb A1c MFr Bld: 5.3 % (ref 4.8–5.6)
Mean Plasma Glucose: 105.41 mg/dL

## 2022-06-03 NOTE — Progress Notes (Addendum)
Anesthesia Chart Review     DISCUSSION:51 y.o. with h/o HTN, DM II, OSA, diastolic heart failure, morbid obesity tentatively scheduled for bariatric surgery with Dr. Dossie Der seen in Pre-op clinic today for anesthesia evaluation.    Pt weight is down from 625lbs to 509lbs today with diet and exercise changes.  He reports he uses a walker at home, minimal ambulation.  He reports using weights and bands to workout daily, reports getting his heart rate up and braking a sweat with these workouts.  Pt does not leave his home.  He was brought in by EMS transport today.  Unable to transfer himself or stand for weight. Seen by Dr. Renold Don today.    Video visit with PCP 05/10/2022. Pt with chronic pain, right shoulder pain.  Currently taking oxycodone 10 mg q6 prn.    Hospital admission 05/31/2021-06/28/2021 due to acute respiratory failure due to decompensated diastolic heart failure.  Required ICU care, aggressive diuresis with improvement.    Follow up with cardiology 07/26/2021. Per OV note, "going for bariatriac surgery  There is no way to really test him for ischemia given his morbid obesity. He would be at moderate - high risk for anesthesia given his size but there are no further tests we could perform that would allow [place] to reduce that risk ."  F/u with pulmonology 08/04/2021. Per OV note stable at this visit.   Previous knee arthroscopy 12/06/2014. No anesthesia complications at this time.   Addendum 06/16/2022:  Cardiology preoperative evaluation 06/15/2022. Per note, "According to the Revised Cardiac Risk Index (RCRI), his Perioperative Risk of Major Cardiac Event is (%): 0.9. His Functional Capacity in METs is: 3.94 according to the Duke Activity Status Index (DASI). Patient' activity is very limited in the setting of morbid obesity. He does walk short distances and performs daily bed exercises without any concerning symptoms.    Per Dr. Harvie Bridge prior note, "There is no way to really  test him for ischemia given his morbid obesity. He would be at moderate - high risk for anesthesia given his size but there are no further tests we could perform that would allow [place] to reduce that risk."  Patient is verbalized understanding and agrees to proceed. "  VS: Ht 6\' 4"  (1.93 m)   Wt (!) 231.1 kg   BMI 62.01 kg/m   PROVIDERS: , MD  Joaquim Nam, MD is Cardiologist  LABS: Labs reviewed: Acceptable for surgery. (all labs ordered are listed, but only abnormal results are displayed)  Labs Reviewed  COMPREHENSIVE METABOLIC PANEL - Abnormal; Notable for the following components:      Result Value   Glucose, Bld 106 (*)    All other components within normal limits  CBC WITH DIFFERENTIAL/PLATELET  NICOTINE/COTININE METABOLITES  HEMOGLOBIN A1C  TYPE AND SCREEN     IMAGES:   EKG:   CV: Echo 06/01/2021 1. Left ventricular ejection fraction, by estimation, is 60 to 65%. The  left ventricle has normal function. The left ventricle has no regional  wall motion abnormalities. There is moderate left ventricular hypertrophy.  Left ventricular diastolic  parameters were normal.   2. Right ventricular systolic function is normal. The right ventricular  size is normal.   3. The mitral valve is normal in structure. No evidence of mitral valve  regurgitation. No evidence of mitral stenosis.   4. The aortic valve is tricuspid. Aortic valve regurgitation is not  visualized. No aortic stenosis is present.   5. Aortic dilatation noted.  There is moderate dilatation of the aortic  root, measuring 44 mm. There is moderate dilatation of the ascending  aorta, measuring 43 mm.  Past Medical History:  Diagnosis Date   Anemia    borderline anemia, was instructed to eat more iron filled foods   GERD (gastroesophageal reflux disease)    lactose intolerance   Hip pain    HTN (hypertension)    Obesity    Sleep apnea     Past Surgical History:  Procedure Laterality Date    FOOT SURGERY     KNEE ARTHROSCOPY WITH MEDIAL MENISECTOMY Left 12/16/2014   Procedure: KNEE ARTHROSCOPY WITH MEDIAL MENISECTOMY;  Surgeon: Sheral Apley, MD;  Location: [facility] OR;  Service: Orthopedics;  Laterality: Left;    MEDICATIONS:  acetaminophen (TYLENOL) 325 MG tablet   allopurinol (ZYLOPRIM) 100 MG tablet   amLODipine (NORVASC) 10 MG tablet   carvedilol (COREG) 3.125 MG tablet   celecoxib (CELEBREX) 100 MG capsule   Colchicine (MITIGARE) 0.6 MG CAPS   Dulaglutide (TRULICITY) 3 MG/0.5ML SOPN   furosemide (LASIX) 40 MG tablet   Multiple Vitamins-Minerals (MULTIVITAMIN WITH MINERALS) tablet   naloxone (NARCAN) nasal spray 4 mg/0.1 mL   oxyCODONE-acetaminophen (PERCOCET) 10-325 MG tablet   potassium chloride (KLOR-CON M10) 10 MEQ tablet   vitamin C (ASCORBIC ACID) 500 MG tablet   No current facility-administered medications for this encounter.   Jodell Cipro Ward, PA-C [facility] [phone]

## 2022-06-04 DIAGNOSIS — I10 Essential (primary) hypertension: Secondary | ICD-10-CM | POA: Diagnosis not present

## 2022-06-05 DIAGNOSIS — I10 Essential (primary) hypertension: Secondary | ICD-10-CM | POA: Diagnosis not present

## 2022-06-06 ENCOUNTER — Telehealth (HOSPITAL_COMMUNITY): Payer: Self-pay | Admitting: *Deleted

## 2022-06-06 ENCOUNTER — Telehealth: Payer: Self-pay | Admitting: *Deleted

## 2022-06-06 DIAGNOSIS — I10 Essential (primary) hypertension: Secondary | ICD-10-CM | POA: Diagnosis not present

## 2022-06-06 NOTE — Telephone Encounter (Signed)
   Pre-operative Risk Assessment    Patient Name: Michael Payne  DOB: [birth year] MRN: [id number]      Request for Surgical Clearance    Procedure:   SLEEVE BARIATRIC SURGERY  Date of Surgery:  Clearance 06/20/22                                 Surgeon:  DR. PAUL STECHSCHULETE Surgeon's Group or Practice Name:  [facility] Phone number:  [phone] Fax number:  [phone] ATTN: Inocencio Homes, CMA   Type of Clearance Requested:   - Medical ; NO MEDICATIONS LISTED AS NEEDING TO BE HELD   Type of Anesthesia:  General    Additional requests/questions:    Jiles Prows   06/06/2022, 11:51 AM

## 2022-06-06 NOTE — Telephone Encounter (Signed)
Left message for pt to call back for tele pre op appt.  

## 2022-06-06 NOTE — Telephone Encounter (Signed)
Spoke with Michael Payne to confirm virtual pre-op appt tomorrow at 9am via WebEx. Pt will receive pre-op information via email along with link to join meeting.  Will continue to f/u with pt as needed as we approach tentative surgery date.

## 2022-06-06 NOTE — Telephone Encounter (Signed)
   Name: Michael Payne  DOB: [birth year]  MRN: [id number]  Primary Cardiologist: None  Chart reviewed as part of pre-operative protocol coverage. Because of Michael Payne's past medical history and time since last visit, he will require a follow-up telephone visit in order to better assess preoperative cardiovascular risk.  Pre-op covering staff: - Please schedule appointment and call patient to inform them. If patient already had an upcoming appointment within acceptable timeframe, please add "pre-op clearance" to the appointment notes so provider is aware. - Please contact requesting surgeon's office via preferred method (i.e, phone, fax) to inform them of need for appointment prior to surgery.  No medications are indicated as needs to be held  Elgie Collard, PA-C  06/06/2022, 12:52 PM

## 2022-06-07 ENCOUNTER — Telehealth: Payer: Self-pay | Admitting: *Deleted

## 2022-06-07 ENCOUNTER — Telehealth (HOSPITAL_COMMUNITY): Payer: Self-pay | Admitting: *Deleted

## 2022-06-07 ENCOUNTER — Encounter: Payer: Self-pay | Admitting: Dietician

## 2022-06-07 ENCOUNTER — Encounter: Payer: Medicare HMO | Attending: Dietician | Admitting: Dietician

## 2022-06-07 DIAGNOSIS — I110 Hypertensive heart disease with heart failure: Secondary | ICD-10-CM | POA: Diagnosis not present

## 2022-06-07 DIAGNOSIS — M109 Gout, unspecified: Secondary | ICD-10-CM | POA: Diagnosis not present

## 2022-06-07 DIAGNOSIS — J9622 Acute and chronic respiratory failure with hypercapnia: Secondary | ICD-10-CM | POA: Diagnosis not present

## 2022-06-07 DIAGNOSIS — J9621 Acute and chronic respiratory failure with hypoxia: Secondary | ICD-10-CM | POA: Diagnosis not present

## 2022-06-07 DIAGNOSIS — I5031 Acute diastolic (congestive) heart failure: Secondary | ICD-10-CM | POA: Diagnosis not present

## 2022-06-07 DIAGNOSIS — D649 Anemia, unspecified: Secondary | ICD-10-CM | POA: Diagnosis not present

## 2022-06-07 DIAGNOSIS — M1611 Unilateral primary osteoarthritis, right hip: Secondary | ICD-10-CM | POA: Diagnosis not present

## 2022-06-07 DIAGNOSIS — I10 Essential (primary) hypertension: Secondary | ICD-10-CM | POA: Diagnosis not present

## 2022-06-07 DIAGNOSIS — E119 Type 2 diabetes mellitus without complications: Secondary | ICD-10-CM

## 2022-06-07 DIAGNOSIS — G4733 Obstructive sleep apnea (adult) (pediatric): Secondary | ICD-10-CM | POA: Diagnosis not present

## 2022-06-07 NOTE — Telephone Encounter (Signed)
Patient returned call

## 2022-06-07 NOTE — Telephone Encounter (Signed)
Pt agreeable to plan of care for tele pre op appt 06/15/22 @ 10:20. Med rec and consent are done.     Patient Consent for Virtual Visit        Michael Payne has provided verbal consent on 06/07/2022 for a virtual visit (video or telephone).   CONSENT FOR VIRTUAL VISIT FOR:  Michael Payne  By participating in this virtual visit I agree to the following:  I hereby voluntarily request, consent and authorize [facility] and its employed or contracted physicians, physician assistants, nurse practitioners or other licensed health care professionals (the Practitioner), to provide me with telemedicine health care services (the "Services") as deemed necessary by the treating Practitioner. I acknowledge and consent to receive the Services by the Practitioner via telemedicine. I understand that the telemedicine visit will involve communicating with the Practitioner through live audiovisual communication technology and the disclosure of certain medical information by electronic transmission. I acknowledge that I have been given the opportunity to request an in-person assessment or other available alternative prior to the telemedicine visit and am voluntarily participating in the telemedicine visit.  I understand that I have the right to withhold or withdraw my consent to the use of telemedicine in the course of my care at any time, without affecting my right to future care or treatment, and that the Practitioner or I may terminate the telemedicine visit at any time. I understand that I have the right to inspect all information obtained and/or recorded in the course of the telemedicine visit and may receive copies of available information for a reasonable fee.  I understand that some of the potential risks of receiving the Services via telemedicine include:  Delay or interruption in medical evaluation due to technological equipment failure or disruption; Information transmitted may not be sufficient  (e.g. poor resolution of images) to allow for appropriate medical decision making by the Practitioner; and/or  In rare instances, security protocols could fail, causing a breach of personal health information.  Furthermore, I acknowledge that it is my responsibility to provide information about my medical history, conditions and care that is complete and accurate to the best of my ability. I acknowledge that Practitioner's advice, recommendations, and/or decision may be based on factors not within their control, such as incomplete or inaccurate data provided by me or distortions of diagnostic images or specimens that may result from electronic transmissions. I understand that the practice of medicine is not an exact science and that Practitioner makes no warranties or guarantees regarding treatment outcomes. I acknowledge that a copy of this consent can be made available to me via my patient portal ([facility]), or I can request a printed copy by calling the office of [facility].    I understand that my insurance will be billed for this visit.   I have read or had this consent read to me. I understand the contents of this consent, which adequately explains the benefits and risks of the Services being provided via telemedicine.  I have been provided ample opportunity to ask questions regarding this consent and the Services and have had my questions answered to my satisfaction. I give my informed consent for the services to be provided through the use of telemedicine in my medical care

## 2022-06-07 NOTE — Progress Notes (Signed)
Pre-Operative Nutrition Class:    Patient was seen on 06/07/2022 for Pre-Operative Bariatric Surgery Education at [facility].    Virtual appointment; Pt agreeable to limitations of this visit type; Pt identified by name and date of birth.  Surgery date:  Surgery type: Sleeve Gastrectomy Start weight at [facility]: pt declined due to coming in in a stretcher Weight today: Virtual appointment  Pt on Webex virtual appointment laying down in bed.   The following the learning objectives were met by the patient during this course: Identify Pre-Op Dietary Goals and will begin 2 weeks pre-operatively Identify appropriate sources of fluids and proteins  State protein recommendations and appropriate sources pre and post-operatively Identify Post-Operative Dietary Goals and will follow for 2 weeks post-operatively Identify appropriate multivitamin and calcium sources Describe the need for physical activity post-operatively and will follow MD recommendations State when to call healthcare provider regarding medication questions or post-operative complications When having a diagnosis of diabetes understanding hypoglycemia symptoms and the inclusion of 1 complex carbohydrate per meal  Handouts given during class include: Pre-Op Bariatric Surgery Diet Handout Protein Shake Handout Post-Op Bariatric Surgery Nutrition Handout BELT Program Information Flyer Support Group Information Flyer [facility] Bariatric Supplements Price List  Follow-Up Plan: Patient will follow-up at [facility] 2 weeks post operatively for diet advancement per MD.

## 2022-06-07 NOTE — Telephone Encounter (Signed)
Reviewed Gastric Sleeve pre-op instructions with via WebEx.  Provided information on pre-surgical testing appointments/expectations, pre-op diet, "Goals for Discharge", Phase 2 Diet, Bariatric Multivitamin and Calcium, BELT program, Support Group and [facility]. Patient can articulate understanding. All questions answered.  Patient provided with surgeon, dietician, and [facility] contact information for any comments, questions, or concerns via phone call or email and has received pre-op folder information via email 06/06/22. Will f/u with patient as surgery date approaches, which is not yet scheduled.

## 2022-06-07 NOTE — Telephone Encounter (Signed)
Pt agreeable to plan of care for tele pre op appt 06/15/22 @ 10:20. Med rec and consent are done.

## 2022-06-08 ENCOUNTER — Encounter (HOSPITAL_COMMUNITY): Payer: Self-pay | Admitting: Surgery

## 2022-06-08 DIAGNOSIS — I10 Essential (primary) hypertension: Secondary | ICD-10-CM | POA: Diagnosis not present

## 2022-06-08 LAB — NICOTINE/COTININE METABOLITES
Cotinine: <1 ng/mL
Nicotine: <1 ng/mL

## 2022-06-08 NOTE — Progress Notes (Addendum)
For Short Stay: [place] appointment date: Date of COVID positive in last 56 days:  Bowel Prep reminder:   For Anesthesia: PCP - [name], MD last office visit note 05/10/22 Cardiologist - Nahser, Wonda Cheng, MD  Chest x-ray - 06/03/22 in epic EKG - 06/03/22 in epic Stress Test -  ECHO - 06/01/21 in epic Cardiac Cath -  Pacemaker/ICD device last checked: Pacemaker orders received: Device Rep notified:  Spinal Cord Stimulator:  Sleep Study -  CPAP/Bipap-   Fasting Blood Sugar -  Checks Blood Sugar _____ times a day Date and result of last Hgb A1c- 06/03/22 5.3 in epic  Blood Thinner Instructions: Aspirin Instructions: Last Dose:  Activity level: Bed bound .  Unable to walk.  Takes a few steps with walker to bathroom.     Anesthesia review:   Patient denies shortness of breath, fever, cough and chest pain at PAT appointment   Patient verbalized understanding of instructions that were given to them at the PAT appointment. Patient was also instructed that they will need to review over the PAT instructions again at home before surgery.

## 2022-06-09 ENCOUNTER — Encounter (HOSPITAL_COMMUNITY): Payer: Self-pay | Admitting: Surgery

## 2022-06-09 ENCOUNTER — Other Ambulatory Visit: Payer: Self-pay

## 2022-06-09 DIAGNOSIS — D649 Anemia, unspecified: Secondary | ICD-10-CM | POA: Diagnosis not present

## 2022-06-09 DIAGNOSIS — I10 Essential (primary) hypertension: Secondary | ICD-10-CM | POA: Diagnosis not present

## 2022-06-09 DIAGNOSIS — I110 Hypertensive heart disease with heart failure: Secondary | ICD-10-CM | POA: Diagnosis not present

## 2022-06-09 DIAGNOSIS — J9622 Acute and chronic respiratory failure with hypercapnia: Secondary | ICD-10-CM | POA: Diagnosis not present

## 2022-06-09 DIAGNOSIS — M109 Gout, unspecified: Secondary | ICD-10-CM | POA: Diagnosis not present

## 2022-06-09 DIAGNOSIS — G4733 Obstructive sleep apnea (adult) (pediatric): Secondary | ICD-10-CM | POA: Diagnosis not present

## 2022-06-09 DIAGNOSIS — I5031 Acute diastolic (congestive) heart failure: Secondary | ICD-10-CM | POA: Diagnosis not present

## 2022-06-09 DIAGNOSIS — E119 Type 2 diabetes mellitus without complications: Secondary | ICD-10-CM | POA: Diagnosis not present

## 2022-06-09 DIAGNOSIS — M1611 Unilateral primary osteoarthritis, right hip: Secondary | ICD-10-CM | POA: Diagnosis not present

## 2022-06-09 DIAGNOSIS — J9621 Acute and chronic respiratory failure with hypoxia: Secondary | ICD-10-CM | POA: Diagnosis not present

## 2022-06-10 DIAGNOSIS — J9621 Acute and chronic respiratory failure with hypoxia: Secondary | ICD-10-CM | POA: Diagnosis not present

## 2022-06-10 DIAGNOSIS — M1611 Unilateral primary osteoarthritis, right hip: Secondary | ICD-10-CM | POA: Diagnosis not present

## 2022-06-10 DIAGNOSIS — J9622 Acute and chronic respiratory failure with hypercapnia: Secondary | ICD-10-CM | POA: Diagnosis not present

## 2022-06-10 DIAGNOSIS — D649 Anemia, unspecified: Secondary | ICD-10-CM | POA: Diagnosis not present

## 2022-06-10 DIAGNOSIS — I10 Essential (primary) hypertension: Secondary | ICD-10-CM | POA: Diagnosis not present

## 2022-06-10 DIAGNOSIS — I110 Hypertensive heart disease with heart failure: Secondary | ICD-10-CM | POA: Diagnosis not present

## 2022-06-10 DIAGNOSIS — G4733 Obstructive sleep apnea (adult) (pediatric): Secondary | ICD-10-CM | POA: Diagnosis not present

## 2022-06-10 DIAGNOSIS — M109 Gout, unspecified: Secondary | ICD-10-CM | POA: Diagnosis not present

## 2022-06-10 DIAGNOSIS — I5031 Acute diastolic (congestive) heart failure: Secondary | ICD-10-CM | POA: Diagnosis not present

## 2022-06-10 DIAGNOSIS — E119 Type 2 diabetes mellitus without complications: Secondary | ICD-10-CM | POA: Diagnosis not present

## 2022-06-11 DIAGNOSIS — I10 Essential (primary) hypertension: Secondary | ICD-10-CM | POA: Diagnosis not present

## 2022-06-12 DIAGNOSIS — I10 Essential (primary) hypertension: Secondary | ICD-10-CM | POA: Diagnosis not present

## 2022-06-13 DIAGNOSIS — I10 Essential (primary) hypertension: Secondary | ICD-10-CM | POA: Diagnosis not present

## 2022-06-14 DIAGNOSIS — I10 Essential (primary) hypertension: Secondary | ICD-10-CM | POA: Diagnosis not present

## 2022-06-14 DIAGNOSIS — G4733 Obstructive sleep apnea (adult) (pediatric): Secondary | ICD-10-CM | POA: Diagnosis not present

## 2022-06-14 DIAGNOSIS — E119 Type 2 diabetes mellitus without complications: Secondary | ICD-10-CM | POA: Diagnosis not present

## 2022-06-14 DIAGNOSIS — J9622 Acute and chronic respiratory failure with hypercapnia: Secondary | ICD-10-CM | POA: Diagnosis not present

## 2022-06-14 DIAGNOSIS — I5031 Acute diastolic (congestive) heart failure: Secondary | ICD-10-CM | POA: Diagnosis not present

## 2022-06-14 DIAGNOSIS — J9621 Acute and chronic respiratory failure with hypoxia: Secondary | ICD-10-CM | POA: Diagnosis not present

## 2022-06-14 DIAGNOSIS — I110 Hypertensive heart disease with heart failure: Secondary | ICD-10-CM | POA: Diagnosis not present

## 2022-06-14 DIAGNOSIS — D649 Anemia, unspecified: Secondary | ICD-10-CM | POA: Diagnosis not present

## 2022-06-14 DIAGNOSIS — M1611 Unilateral primary osteoarthritis, right hip: Secondary | ICD-10-CM | POA: Diagnosis not present

## 2022-06-14 DIAGNOSIS — M109 Gout, unspecified: Secondary | ICD-10-CM | POA: Diagnosis not present

## 2022-06-15 ENCOUNTER — Other Ambulatory Visit (HOSPITAL_COMMUNITY): Payer: Medicare HMO

## 2022-06-15 ENCOUNTER — Ambulatory Visit: Payer: Medicare HMO | Attending: Cardiology | Admitting: Nurse Practitioner

## 2022-06-15 DIAGNOSIS — G4733 Obstructive sleep apnea (adult) (pediatric): Secondary | ICD-10-CM | POA: Diagnosis not present

## 2022-06-15 DIAGNOSIS — I5031 Acute diastolic (congestive) heart failure: Secondary | ICD-10-CM | POA: Diagnosis not present

## 2022-06-15 DIAGNOSIS — Z01810 Encounter for preprocedural cardiovascular examination: Secondary | ICD-10-CM | POA: Diagnosis not present

## 2022-06-15 DIAGNOSIS — J9622 Acute and chronic respiratory failure with hypercapnia: Secondary | ICD-10-CM | POA: Diagnosis not present

## 2022-06-15 DIAGNOSIS — D649 Anemia, unspecified: Secondary | ICD-10-CM | POA: Diagnosis not present

## 2022-06-15 DIAGNOSIS — I110 Hypertensive heart disease with heart failure: Secondary | ICD-10-CM | POA: Diagnosis not present

## 2022-06-15 DIAGNOSIS — M109 Gout, unspecified: Secondary | ICD-10-CM | POA: Diagnosis not present

## 2022-06-15 DIAGNOSIS — E119 Type 2 diabetes mellitus without complications: Secondary | ICD-10-CM | POA: Diagnosis not present

## 2022-06-15 DIAGNOSIS — I10 Essential (primary) hypertension: Secondary | ICD-10-CM | POA: Diagnosis not present

## 2022-06-15 DIAGNOSIS — M1611 Unilateral primary osteoarthritis, right hip: Secondary | ICD-10-CM | POA: Diagnosis not present

## 2022-06-15 DIAGNOSIS — J9621 Acute and chronic respiratory failure with hypoxia: Secondary | ICD-10-CM | POA: Diagnosis not present

## 2022-06-15 NOTE — Progress Notes (Signed)
Virtual Visit via Telephone Note   Because of Michael Payne's co-morbid illnesses, he is at least at moderate risk for complications without adequate follow up.  This format is felt to be most appropriate for this patient at this time.  The patient did not have access to video technology/had technical difficulties with video requiring transitioning to audio format only (telephone).  All issues noted in this document were discussed and addressed.  No physical exam could be performed with this format.  Please refer to the patient's chart for his consent to telehealth for [facility].  Evaluation Performed:  Preoperative cardiovascular risk assessment _____________   Date:  06/15/2022   Patient ID:  Michael Payne, DOB [birth year], MRN [id number] Patient Location:  Home Provider location:   Office  Primary Care Provider:  Joaquim Nam, MD Primary Cardiologist:  None  Chief Complaint / Patient Profile   51 y.o. y/o male with a h/o diastolic heart failure, pretension, prediabetes, OSA, obesity, and GERD who is pending sleeve bariatric surgery on 06/20/2022 with Dr. Moss Mc of [facility] and presents today for telephonic preoperative cardiovascular risk assessment.  Past Medical History    Past Medical History:  Diagnosis Date   Anemia    borderline anemia, was instructed to eat more iron filled foods   Cardiac enlargement 06/03/2022   Noted on Xray   CHF (congestive heart failure) (HCC) 05/2021   GERD (gastroesophageal reflux disease)    lactose intolerance, patient denies symptoms of reflux   Hip pain    History of kidney stones    HTN (hypertension)    Morbid obesity (HCC)    Obesity    Pneumonia 05/2021   Pre-diabetes    Sleep apnea    Past Surgical History:  Procedure Laterality Date   FOOT SURGERY Right    KNEE ARTHROSCOPY WITH MEDIAL MENISECTOMY Left 12/16/2014   Procedure: KNEE ARTHROSCOPY WITH MEDIAL  MENISECTOMY;  Surgeon: Sheral Apley, MD;  Location: [facility] OR;  Service: Orthopedics;  Laterality: Left;    Allergies  Allergies  Allergen Reactions   Aspirin Other (See Comments)    Upset stomach   Lactose Intolerance (Gi) Nausea And Vomiting   Lisinopril     Causes cough    History of Present Illness    Michael Payne is a 51 y.o. male who presents via Web designer for a /telehealth visit today.  Pt was last seen in cardiology clinic on 07/26/2021 by Dr. Elease Hashimoto.  At that time Michael Payne was doing well. The patient is now pending procedure as outlined above. Since his last visit, he has done well from a cardiac standpoint. He denies chest pain, palpitations, dyspnea, pnd, orthopnea, n, v, dizziness, syncope, edema, weight gain, or early satiety. All other systems reviewed and are otherwise negative except as noted above.   Home Medications    Prior to Admission medications   Medication Sig Start Date End Date Taking? Authorizing Provider  acetaminophen (TYLENOL) 325 MG tablet Take 2 tablets (650 mg total) by mouth every 6 (six) hours as needed for headache or mild pain. 06/24/21   Russella Dar, NP  allopurinol (ZYLOPRIM) 100 MG tablet Take 1 tablet (100 mg total) by mouth every other day. 01/10/22   Joaquim Nam, MD  amLODipine (NORVASC) 10 MG tablet Take 1 tablet (10 mg total) by mouth daily. 07/29/21   Joaquim Nam, MD  carvedilol (COREG) 3.125 MG tablet  TAKE 1 TABLET BY MOUTH TWICE A DAY WITH A MEAL 01/10/22   [name], MD  celecoxib (CELEBREX) 100 MG capsule Take 1 capsule (100 mg total) by mouth 2 (two) times daily. 05/10/22   [name], MD  Colchicine (MITIGARE) 0.6 MG CAPS Take 0.5 tablets by mouth daily as needed (for gout.  okay to fill with mitigare). 07/30/21   [name], MD  Dulaglutide (TRULICITY) 3 NO/6.7EH SOPN INJECT 3 MG AS DIRECTED ONCE A WEEK Patient taking differently: Inject 3 mg as directed once a week. Saturday  01/17/22   [name], MD  furosemide (LASIX) 40 MG tablet TAKE 1 TABLET EVERY DAY 04/07/22   [name], MD  Multiple Vitamins-Minerals (MULTIVITAMIN WITH MINERALS) tablet Take 1 tablet by mouth daily.    [provider]  naloxone [facility]) nasal spray 4 mg/0.1 mL Spray nostril if needed for opioid overuse- decreased consciousness, decreased respirations 09/02/20   Elby Beck, FNP  oxyCODONE-acetaminophen (PERCOCET) 10-325 MG tablet Take 1 tablet by mouth every 6 (six) hours as needed for pain. 05/25/22   [name], MD  potassium chloride (KLOR-CON M10) 10 MEQ tablet Take 1 tablet (10 mEq total) by mouth daily. 04/14/22   [name], MD  vitamin C (ASCORBIC ACID) 500 MG tablet Take 500 mg by mouth daily.    [provider]    Physical Exam    Vital Signs:  Michael Payne does not have vital signs available for review today.  Given telephonic nature of communication, physical exam is limited. AAOx3. NAD. Normal affect.  Speech and respirations are unlabored.  Accessory Clinical Findings    None  Assessment & Plan    1.  Preoperative Cardiovascular Risk Assessment:  According to the Revised Cardiac Risk Index (RCRI), his Perioperative Risk of Major Cardiac Event is (%): 0.9. His Functional Capacity in METs is: 3.94 according to the Duke Activity Status Index (DASI). Patient' activity is very limited in the setting of morbid obesity. He does walk short distances and performs daily bed exercises without any concerning symptoms.   Per Dr. Elmarie Shiley prior note, "There is no way to really test him for ischemia given his morbid obesity. He would be at moderate - high risk for anesthesia given his size but there are no further tests we could perform that would allow [place] to reduce that risk."  Patient is verbalized understanding and agrees to proceed.   The patient was advised that if he develops new symptoms prior to surgery to contact our office to  arrange for a follow-up visit, and he verbalized understanding.   A copy of this note will be routed to requesting surgeon.  Time:   Today, I have spent 6 minutes with the patient with telehealth technology discussing medical history, symptoms, and management plan.     Lenna Sciara, NP  06/15/2022, 10:36 AM

## 2022-06-16 DIAGNOSIS — G4733 Obstructive sleep apnea (adult) (pediatric): Secondary | ICD-10-CM | POA: Diagnosis not present

## 2022-06-16 DIAGNOSIS — J9622 Acute and chronic respiratory failure with hypercapnia: Secondary | ICD-10-CM | POA: Diagnosis not present

## 2022-06-16 DIAGNOSIS — I110 Hypertensive heart disease with heart failure: Secondary | ICD-10-CM | POA: Diagnosis not present

## 2022-06-16 DIAGNOSIS — M1611 Unilateral primary osteoarthritis, right hip: Secondary | ICD-10-CM | POA: Diagnosis not present

## 2022-06-16 DIAGNOSIS — D649 Anemia, unspecified: Secondary | ICD-10-CM | POA: Diagnosis not present

## 2022-06-16 DIAGNOSIS — J9621 Acute and chronic respiratory failure with hypoxia: Secondary | ICD-10-CM | POA: Diagnosis not present

## 2022-06-16 DIAGNOSIS — E119 Type 2 diabetes mellitus without complications: Secondary | ICD-10-CM | POA: Diagnosis not present

## 2022-06-16 DIAGNOSIS — I5031 Acute diastolic (congestive) heart failure: Secondary | ICD-10-CM | POA: Diagnosis not present

## 2022-06-16 DIAGNOSIS — M109 Gout, unspecified: Secondary | ICD-10-CM | POA: Diagnosis not present

## 2022-06-16 NOTE — Anesthesia Preprocedure Evaluation (Addendum)
Anesthesia Evaluation  Patient identified by MRN, date of birth, ID band Patient awake    Reviewed: Allergy & Precautions, H&P , NPO status , Patient's Chart, lab work & pertinent test results  Airway Mallampati: II  TM Distance: >3 FB Neck ROM: Full    Dental no notable dental hx. (+) Teeth Intact, Dental Advisory Given, Caps   Pulmonary sleep apnea , former smoker,    Pulmonary exam normal breath sounds clear to auscultation       Cardiovascular Exercise Tolerance: Good hypertension, Pt. on medications +CHF  Normal cardiovascular exam Rhythm:Regular Rate:Normal  Echo 06/01/2021 1. Left ventricular ejection fraction, by estimation, is 60 to 65%. The  left ventricle has normal function. The left ventricle has no regional  wall motion abnormalities. There is moderate left ventricular hypertrophy.  Left ventricular diastolic  parameters were normal.  2. Right ventricular systolic function is normal. The right ventricular  size is normal.  3. The mitral valve is normal in structure. No evidence of mitral valve  regurgitation. No evidence of mitral stenosis.  4. The aortic valve is tricuspid. Aortic valve regurgitation is not  visualized. No aortic stenosis is present.  5. Aortic dilatation noted. There is moderate dilatation of the aortic  root, measuring 44 mm. There is moderate dilatation of the ascending  aorta, measuring 43 mm.    Neuro/Psych negative neurological ROS  negative psych ROS   GI/Hepatic Neg liver ROS, GERD  Medicated,  Endo/Other  diabetesMorbid obesity  Renal/GU Renal disease  negative genitourinary   Musculoskeletal  (+) Arthritis , Osteoarthritis,    Abdominal   Peds negative pediatric ROS (+)  Hematology  (+) Blood dyscrasia, anemia ,   Anesthesia Other Findings   Reproductive/Obstetrics negative OB ROS                           Anesthesia Physical Anesthesia  Plan  ASA: 4  Anesthesia Plan: General   Post-op Pain Management: Ofirmev IV (intra-op)* and Toradol IV (intra-op)*   Induction: Intravenous  PONV Risk Score and Plan: 2 and Ondansetron, Dexamethasone and Treatment may vary due to age or medical condition  Airway Management Planned: Oral ETT  Additional Equipment: None  Intra-op Plan:   Post-operative Plan: Extubation in OR  Informed Consent: I have reviewed the patients History and Physical, chart, labs and discussed the procedure including the risks, benefits and alternatives for the proposed anesthesia with the patient or authorized representative who has indicated his/her understanding and acceptance.       Plan Discussed with: Anesthesiologist and CRNA  Anesthesia Plan Comments: (See PAT note 06/03/2022 DISCUSSION:51 y.o. with h/o HTN, DM II, OSA, diastolic heart failure, morbid obesity tentatively scheduled for bariatric surgery with Dr. Thermon Leyland seen in [place] clinic today for anesthesia evaluation.    Pt weight is down from 625lbs to 509lbs today with diet and exercise changes.  He reports he uses a walker at home, minimal ambulation.  He reports using weights and bands to workout daily, reports getting his heart rate up and braking a sweat with these workouts.  Pt does not leave his home.  He was brought in by EMS transport today.  Unable to transfer himself or stand for weight. Seen by Dr. Lissa Hoard today.    Video visit with PCP 05/10/2022. Pt with chronic pain, right shoulder pain.  Currently taking oxycodone 10 mg q6 prn.    Hospital admission 05/31/2021-06/28/2021 due to acute respiratory failure due to  decompensated diastolic heart failure.  Required ICU care, aggressive diuresis with improvement.    Follow up with cardiology 07/26/2021. Per OV note, "going for bariatriac surgery  There is no way to really test him for ischemia given his morbid obesity. He would be at moderate - high risk for anesthesia  given his size but there are no further tests we could perform that would allow [place] to reduce that risk ."  F/u with pulmonology 08/04/2021. Per OV note stable at this visit.   Previous knee arthroscopy 12/06/2014. No anesthesia complications at this time. )      Anesthesia Quick Evaluation

## 2022-06-17 DIAGNOSIS — I10 Essential (primary) hypertension: Secondary | ICD-10-CM | POA: Diagnosis not present

## 2022-06-18 DIAGNOSIS — I10 Essential (primary) hypertension: Secondary | ICD-10-CM | POA: Diagnosis not present

## 2022-06-19 DIAGNOSIS — I10 Essential (primary) hypertension: Secondary | ICD-10-CM | POA: Diagnosis not present

## 2022-06-20 ENCOUNTER — Other Ambulatory Visit: Payer: Self-pay

## 2022-06-20 ENCOUNTER — Encounter (HOSPITAL_COMMUNITY)
Admission: RE | Disposition: A | Discharge status: Home Health | Payer: Self-pay | Source: Home / Self Care | Attending: Surgery

## 2022-06-20 ENCOUNTER — Inpatient Hospital Stay (HOSPITAL_COMMUNITY): Payer: Medicare HMO | Admitting: Anesthesiology

## 2022-06-20 ENCOUNTER — Inpatient Hospital Stay (HOSPITAL_COMMUNITY): Payer: Medicare HMO | Admitting: Physician Assistant

## 2022-06-20 ENCOUNTER — Telehealth: Payer: Self-pay | Admitting: Family Medicine

## 2022-06-20 ENCOUNTER — Inpatient Hospital Stay (HOSPITAL_COMMUNITY)
Admission: RE | Admit: 2022-06-20 | Discharge: 2022-06-28 | DRG: 620 | Disposition: A | Discharge status: Home Health | Payer: Medicare HMO | Attending: Surgery | Admitting: Surgery

## 2022-06-20 ENCOUNTER — Encounter (HOSPITAL_COMMUNITY): Payer: Self-pay | Admitting: Surgery

## 2022-06-20 DIAGNOSIS — E119 Type 2 diabetes mellitus without complications: Secondary | ICD-10-CM | POA: Diagnosis not present

## 2022-06-20 DIAGNOSIS — K219 Gastro-esophageal reflux disease without esophagitis: Secondary | ICD-10-CM | POA: Diagnosis present

## 2022-06-20 DIAGNOSIS — E860 Dehydration: Secondary | ICD-10-CM | POA: Diagnosis not present

## 2022-06-20 DIAGNOSIS — M1611 Unilateral primary osteoarthritis, right hip: Secondary | ICD-10-CM | POA: Diagnosis present

## 2022-06-20 DIAGNOSIS — G4733 Obstructive sleep apnea (adult) (pediatric): Secondary | ICD-10-CM | POA: Diagnosis present

## 2022-06-20 DIAGNOSIS — J9610 Chronic respiratory failure, unspecified whether with hypoxia or hypercapnia: Secondary | ICD-10-CM | POA: Diagnosis not present

## 2022-06-20 DIAGNOSIS — I5032 Chronic diastolic (congestive) heart failure: Secondary | ICD-10-CM | POA: Diagnosis not present

## 2022-06-20 DIAGNOSIS — E8881 Metabolic syndrome: Secondary | ICD-10-CM | POA: Diagnosis present

## 2022-06-20 DIAGNOSIS — Z79899 Other long term (current) drug therapy: Secondary | ICD-10-CM

## 2022-06-20 DIAGNOSIS — Z87442 Personal history of urinary calculi: Secondary | ICD-10-CM

## 2022-06-20 DIAGNOSIS — Z87891 Personal history of nicotine dependence: Secondary | ICD-10-CM | POA: Diagnosis not present

## 2022-06-20 DIAGNOSIS — R160 Hepatomegaly, not elsewhere classified: Secondary | ICD-10-CM | POA: Diagnosis not present

## 2022-06-20 DIAGNOSIS — I509 Heart failure, unspecified: Secondary | ICD-10-CM

## 2022-06-20 DIAGNOSIS — D649 Anemia, unspecified: Secondary | ICD-10-CM | POA: Diagnosis present

## 2022-06-20 DIAGNOSIS — Z7401 Bed confinement status: Secondary | ICD-10-CM | POA: Diagnosis not present

## 2022-06-20 DIAGNOSIS — Z888 Allergy status to other drugs, medicaments and biological substances status: Secondary | ICD-10-CM | POA: Diagnosis not present

## 2022-06-20 DIAGNOSIS — I110 Hypertensive heart disease with heart failure: Secondary | ICD-10-CM | POA: Diagnosis present

## 2022-06-20 DIAGNOSIS — Z801 Family history of malignant neoplasm of trachea, bronchus and lung: Secondary | ICD-10-CM

## 2022-06-20 DIAGNOSIS — Z886 Allergy status to analgesic agent status: Secondary | ICD-10-CM | POA: Diagnosis not present

## 2022-06-20 DIAGNOSIS — G8929 Other chronic pain: Secondary | ICD-10-CM | POA: Diagnosis present

## 2022-06-20 DIAGNOSIS — Z791 Long term (current) use of non-steroidal anti-inflammatories (NSAID): Secondary | ICD-10-CM

## 2022-06-20 DIAGNOSIS — Z6841 Body Mass Index (BMI) 40.0 and over, adult: Secondary | ICD-10-CM

## 2022-06-20 DIAGNOSIS — Z8249 Family history of ischemic heart disease and other diseases of the circulatory system: Secondary | ICD-10-CM

## 2022-06-20 DIAGNOSIS — R7303 Prediabetes: Secondary | ICD-10-CM

## 2022-06-20 DIAGNOSIS — M25551 Pain in right hip: Secondary | ICD-10-CM | POA: Diagnosis present

## 2022-06-20 DIAGNOSIS — Z01818 Encounter for other preprocedural examination: Secondary | ICD-10-CM

## 2022-06-20 DIAGNOSIS — I10 Essential (primary) hypertension: Secondary | ICD-10-CM | POA: Diagnosis not present

## 2022-06-20 DIAGNOSIS — M25511 Pain in right shoulder: Secondary | ICD-10-CM | POA: Diagnosis present

## 2022-06-20 DIAGNOSIS — Z8701 Personal history of pneumonia (recurrent): Secondary | ICD-10-CM

## 2022-06-20 DIAGNOSIS — E739 Lactose intolerance, unspecified: Secondary | ICD-10-CM | POA: Diagnosis present

## 2022-06-20 DIAGNOSIS — Z7985 Long-term (current) use of injectable non-insulin antidiabetic drugs: Secondary | ICD-10-CM

## 2022-06-20 DIAGNOSIS — E781 Pure hyperglyceridemia: Secondary | ICD-10-CM | POA: Diagnosis present

## 2022-06-20 DIAGNOSIS — M109 Gout, unspecified: Secondary | ICD-10-CM | POA: Diagnosis present

## 2022-06-20 DIAGNOSIS — R531 Weakness: Secondary | ICD-10-CM | POA: Diagnosis not present

## 2022-06-20 HISTORY — DX: Prediabetes: R73.03

## 2022-06-20 HISTORY — DX: Morbid (severe) obesity due to excess calories: E66.01

## 2022-06-20 HISTORY — PX: UPPER GI ENDOSCOPY: SHX6162

## 2022-06-20 HISTORY — DX: Personal history of urinary calculi: Z87.442

## 2022-06-20 LAB — MRSA NEXT GEN BY PCR, NASAL: MRSA by PCR Next Gen: DETECTED — AB

## 2022-06-20 LAB — CBC
HCT: 45 % (ref 39.0–52.0)
Hemoglobin: 13.8 g/dL (ref 13.0–17.0)
MCH: 29.7 pg (ref 26.0–34.0)
MCHC: 30.7 g/dL (ref 30.0–36.0)
MCV: 97 fL (ref 80.0–100.0)
Platelets: 232 10*3/uL (ref 150–400)
RBC: 4.64 MIL/uL (ref 4.22–5.81)
RDW: 14.1 % (ref 11.5–15.5)
WBC: 12.9 10*3/uL — ABNORMAL HIGH (ref 4.0–10.5)
nRBC: 0 % (ref 0.0–0.2)

## 2022-06-20 LAB — CREATININE, SERUM
Creatinine, Ser: 0.85 mg/dL (ref 0.61–1.24)
GFR, Estimated: >60 mL/min (ref >60)

## 2022-06-20 LAB — HEMOGLOBIN AND HEMATOCRIT, BLOOD
HCT: 45.7 % (ref 39.0–52.0)
Hemoglobin: 13.8 g/dL (ref 13.0–17.0)

## 2022-06-20 LAB — GLUCOSE, CAPILLARY
Glucose-Capillary: 100 mg/dL — ABNORMAL HIGH (ref 70–99)
Glucose-Capillary: 161 mg/dL — ABNORMAL HIGH (ref 70–99)
Glucose-Capillary: 145 mg/dL — ABNORMAL HIGH (ref 70–99)

## 2022-06-20 SURGERY — XI ROBOTIC GASTRIC SLEEVE RESECTION
Anesthesia: General | Site: Esophagus

## 2022-06-20 MED ORDER — PROPOFOL 10 MG/ML IV BOLUS
INTRAVENOUS | Status: DC | PRN
  Administered 2022-06-20: 200 mg via INTRAVENOUS

## 2022-06-20 MED ORDER — ONDANSETRON HCL 4 MG/2ML IJ SOLN
4.0000 mg | Freq: Once | INTRAMUSCULAR | Status: AC | PRN
  Administered 2022-06-20: 4 mg via INTRAVENOUS

## 2022-06-20 MED ORDER — FENTANYL CITRATE PF 50 MCG/ML IJ SOSY
PREFILLED_SYRINGE | INTRAMUSCULAR | Status: AC
  Filled 2022-06-20: qty 1

## 2022-06-20 MED ORDER — LIDOCAINE 2% (20 MG/ML) 5 ML SYRINGE
INTRAMUSCULAR | Status: DC | PRN
  Administered 2022-06-20: 80 mg via INTRAVENOUS

## 2022-06-20 MED ORDER — LACTATED RINGERS IR SOLN
Status: DC | PRN
  Administered 2022-06-20: 1000 mL

## 2022-06-20 MED ORDER — PHENYLEPHRINE HCL (PRESSORS) 10 MG/ML IV SOLN
INTRAVENOUS | Status: AC
  Filled 2022-06-20: qty 1

## 2022-06-20 MED ORDER — CARVEDILOL 3.125 MG PO TABS
3.1250 mg | ORAL_TABLET | Freq: Two times a day (BID) | ORAL | Status: DC
  Administered 2022-06-21 – 2022-06-28 (×15): 3.125 mg via ORAL
  Filled 2022-06-20 (×15): qty 1

## 2022-06-20 MED ORDER — ORAL CARE MOUTH RINSE: 15.0000 mL | OROMUCOSAL | Status: DC | PRN

## 2022-06-20 MED ORDER — MIDAZOLAM HCL 5 MG/5ML IJ SOLN
INTRAMUSCULAR | Status: DC | PRN
  Administered 2022-06-20: 2 mg via INTRAVENOUS

## 2022-06-20 MED ORDER — DEXAMETHASONE SODIUM PHOSPHATE 10 MG/ML IJ SOLN
INTRAMUSCULAR | Status: DC | PRN
  Administered 2022-06-20: 10 mg via INTRAVENOUS

## 2022-06-20 MED ORDER — ROCURONIUM BROMIDE 10 MG/ML (PF) SYRINGE
PREFILLED_SYRINGE | INTRAVENOUS | Status: AC
  Filled 2022-06-20: qty 50

## 2022-06-20 MED ORDER — BUPIVACAINE LIPOSOME 1.3 % IJ SUSP: 20.0000 mL | Freq: Once | INTRAMUSCULAR | Status: DC

## 2022-06-20 MED ORDER — MIDAZOLAM HCL 2 MG/2ML IJ SOLN
INTRAMUSCULAR | Status: AC
  Filled 2022-06-20: qty 2

## 2022-06-20 MED ORDER — HYDROMORPHONE HCL 1 MG/ML IJ SOLN
1.0000 mg | INTRAMUSCULAR | Status: DC | PRN
  Administered 2022-06-21 – 2022-06-22 (×5): 1 mg via INTRAVENOUS
  Filled 2022-06-20 (×5): qty 1

## 2022-06-20 MED ORDER — CHLORHEXIDINE GLUCONATE 0.12 % MT SOLN
15.0000 mL | Freq: Once | OROMUCOSAL | Status: AC
  Administered 2022-06-20: 15 mL via OROMUCOSAL

## 2022-06-20 MED ORDER — ACETAMINOPHEN 160 MG/5ML PO SOLN
1000.0000 mg | Freq: Three times a day (TID) | ORAL | Status: DC
  Administered 2022-06-20 – 2022-06-25 (×11): 1000 mg via ORAL
  Filled 2022-06-20 (×11): qty 40.6

## 2022-06-20 MED ORDER — FENTANYL CITRATE (PF) 250 MCG/5ML IJ SOLN
INTRAMUSCULAR | Status: AC
  Filled 2022-06-20: qty 5

## 2022-06-20 MED ORDER — PROPOFOL 10 MG/ML IV BOLUS
INTRAVENOUS | Status: AC
  Filled 2022-06-20: qty 20

## 2022-06-20 MED ORDER — LABETALOL HCL 5 MG/ML IV SOLN
10.0000 mg | INTRAVENOUS | Status: AC | PRN
  Administered 2022-06-20 (×2): 10 mg via INTRAVENOUS

## 2022-06-20 MED ORDER — ENSURE MAX PROTEIN PO LIQD
2.0000 [oz_av] | ORAL | Status: DC
  Administered 2022-06-21 – 2022-06-27 (×38): 2 [oz_av] via ORAL
  Filled 2022-06-20 (×19): qty 330

## 2022-06-20 MED ORDER — INSULIN ASPART 100 UNIT/ML IJ SOLN
0.0000 [IU] | Freq: Three times a day (TID) | INTRAMUSCULAR | Status: DC
  Administered 2022-06-21 – 2022-06-23 (×4): 3 [IU] via SUBCUTANEOUS

## 2022-06-20 MED ORDER — PANTOPRAZOLE SODIUM 40 MG IV SOLR
40.0000 mg | Freq: Every day | INTRAVENOUS | Status: DC
  Administered 2022-06-20 – 2022-06-27 (×8): 40 mg via INTRAVENOUS
  Filled 2022-06-20 (×8): qty 10

## 2022-06-20 MED ORDER — OXYCODONE HCL 5 MG PO TABS: 5.0000 mg | ORAL_TABLET | Freq: Once | ORAL | Status: DC | PRN

## 2022-06-20 MED ORDER — SUCCINYLCHOLINE CHLORIDE 200 MG/10ML IV SOSY
PREFILLED_SYRINGE | INTRAVENOUS | Status: DC | PRN
  Administered 2022-06-20: 180 mg via INTRAVENOUS

## 2022-06-20 MED ORDER — ONDANSETRON HCL 4 MG/2ML IJ SOLN
4.0000 mg | INTRAMUSCULAR | Status: DC | PRN
  Administered 2022-06-20 – 2022-06-27 (×2): 4 mg via INTRAVENOUS
  Filled 2022-06-20 (×2): qty 2

## 2022-06-20 MED ORDER — SODIUM CHLORIDE 0.9 % IV SOLN
2.0000 g | INTRAVENOUS | Status: AC
  Administered 2022-06-20: 2 g via INTRAVENOUS
  Filled 2022-06-20: qty 2

## 2022-06-20 MED ORDER — CHLORHEXIDINE GLUCONATE CLOTH 2 % EX PADS
6.0000 | MEDICATED_PAD | Freq: Every day | CUTANEOUS | Status: DC
  Administered 2022-06-22 – 2022-06-25 (×4): 6 via TOPICAL

## 2022-06-20 MED ORDER — SIMETHICONE 80 MG PO CHEW
80.0000 mg | CHEWABLE_TABLET | Freq: Four times a day (QID) | ORAL | Status: DC | PRN
  Administered 2022-06-21 – 2022-06-26 (×5): 80 mg via ORAL
  Filled 2022-06-20 (×5): qty 1

## 2022-06-20 MED ORDER — ACETAMINOPHEN 500 MG PO TABS
1000.0000 mg | ORAL_TABLET | ORAL | Status: AC
  Administered 2022-06-20: 1000 mg via ORAL
  Filled 2022-06-20: qty 2

## 2022-06-20 MED ORDER — ACETAMINOPHEN 500 MG PO TABS
1000.0000 mg | ORAL_TABLET | Freq: Three times a day (TID) | ORAL | Status: DC
  Administered 2022-06-23 – 2022-06-28 (×7): 1000 mg via ORAL
  Filled 2022-06-20 (×13): qty 2

## 2022-06-20 MED ORDER — APREPITANT 40 MG PO CAPS
40.0000 mg | ORAL_CAPSULE | ORAL | Status: AC
  Administered 2022-06-20: 40 mg via ORAL
  Filled 2022-06-20: qty 1

## 2022-06-20 MED ORDER — LACTATED RINGERS IV SOLN: INTRAVENOUS | Status: DC

## 2022-06-20 MED ORDER — BUPIVACAINE LIPOSOME 1.3 % IJ SUSP
INTRAMUSCULAR | Status: DC | PRN
  Administered 2022-06-20: 20 mL

## 2022-06-20 MED ORDER — OXYCODONE HCL 5 MG/5ML PO SOLN
10.0000 mg | ORAL | Status: DC | PRN
  Administered 2022-06-20 – 2022-06-28 (×20): 10 mg via ORAL
  Filled 2022-06-20 (×21): qty 10

## 2022-06-20 MED ORDER — AMLODIPINE BESYLATE 10 MG PO TABS
10.0000 mg | ORAL_TABLET | Freq: Every day | ORAL | Status: DC
  Administered 2022-06-21 – 2022-06-28 (×8): 10 mg via ORAL
  Filled 2022-06-20 (×8): qty 1

## 2022-06-20 MED ORDER — 0.9 % SODIUM CHLORIDE (POUR BTL) OPTIME
TOPICAL | Status: DC | PRN
  Administered 2022-06-20: 1000 mL

## 2022-06-20 MED ORDER — ACETAMINOPHEN 325 MG PO TABS: 325.0000 mg | ORAL_TABLET | ORAL | Status: DC | PRN

## 2022-06-20 MED ORDER — OXYCODONE HCL 5 MG/5ML PO SOLN: 5.0000 mg | Freq: Once | ORAL | Status: DC | PRN

## 2022-06-20 MED ORDER — CHLORHEXIDINE GLUCONATE CLOTH 2 % EX PADS: 6.0000 | MEDICATED_PAD | Freq: Once | CUTANEOUS | Status: DC

## 2022-06-20 MED ORDER — HYDROMORPHONE HCL 1 MG/ML IJ SOLN
0.2500 mg | INTRAMUSCULAR | Status: DC | PRN
  Administered 2022-06-20 (×2): 0.5 mg via INTRAVENOUS

## 2022-06-20 MED ORDER — SUGAMMADEX SODIUM 200 MG/2ML IV SOLN
INTRAVENOUS | Status: DC | PRN
  Administered 2022-06-20: 500 mg via INTRAVENOUS

## 2022-06-20 MED ORDER — BUPIVACAINE-EPINEPHRINE 0.25% -1:200000 IJ SOLN
INTRAMUSCULAR | Status: DC | PRN
  Administered 2022-06-20: 30 mL

## 2022-06-20 MED ORDER — ONDANSETRON HCL 4 MG/2ML IJ SOLN
INTRAMUSCULAR | Status: DC | PRN
  Administered 2022-06-20: 4 mg via INTRAVENOUS

## 2022-06-20 MED ORDER — ALLOPURINOL 100 MG PO TABS
100.0000 mg | ORAL_TABLET | ORAL | Status: DC
  Administered 2022-06-23 – 2022-06-27 (×3): 100 mg via ORAL
  Filled 2022-06-20 (×5): qty 1

## 2022-06-20 MED ORDER — ORAL CARE MOUTH RINSE: 15.0000 mL | Freq: Once | OROMUCOSAL | Status: AC

## 2022-06-20 MED ORDER — BUPIVACAINE-EPINEPHRINE (PF) 0.25% -1:200000 IJ SOLN
INTRAMUSCULAR | Status: AC
  Filled 2022-06-20: qty 30

## 2022-06-20 MED ORDER — LABETALOL HCL 5 MG/ML IV SOLN
INTRAVENOUS | Status: AC
  Administered 2022-06-20: 10 mg via INTRAVENOUS
  Filled 2022-06-20: qty 4

## 2022-06-20 MED ORDER — INSULIN ASPART 100 UNIT/ML IJ SOLN: 0.0000 [IU] | Freq: Every day | INTRAMUSCULAR | Status: DC

## 2022-06-20 MED ORDER — PHENYLEPHRINE 80 MCG/ML (10ML) SYRINGE FOR IV PUSH (FOR BLOOD PRESSURE SUPPORT)
PREFILLED_SYRINGE | INTRAVENOUS | Status: DC | PRN
  Administered 2022-06-20: 160 ug via INTRAVENOUS
  Administered 2022-06-20: 240 ug via INTRAVENOUS
  Administered 2022-06-20: 160 ug via INTRAVENOUS

## 2022-06-20 MED ORDER — KETAMINE HCL 10 MG/ML IJ SOLN
INTRAMUSCULAR | Status: DC | PRN
  Administered 2022-06-20: 30 mg via INTRAVENOUS

## 2022-06-20 MED ORDER — ACETAMINOPHEN 160 MG/5ML PO SOLN: 325.0000 mg | ORAL | Status: DC | PRN

## 2022-06-20 MED ORDER — FENTANYL CITRATE (PF) 100 MCG/2ML IJ SOLN
INTRAMUSCULAR | Status: DC | PRN
  Administered 2022-06-20: 50 ug via INTRAVENOUS
  Administered 2022-06-20: 100 ug via INTRAVENOUS
  Administered 2022-06-20 (×2): 50 ug via INTRAVENOUS

## 2022-06-20 MED ORDER — CHLORHEXIDINE GLUCONATE CLOTH 2 % EX PADS
6.0000 | MEDICATED_PAD | Freq: Once | CUTANEOUS | Status: DC
  Administered 2022-06-20: 6 via TOPICAL

## 2022-06-20 MED ORDER — FENTANYL CITRATE PF 50 MCG/ML IJ SOSY
25.0000 ug | PREFILLED_SYRINGE | INTRAMUSCULAR | Status: DC | PRN
  Administered 2022-06-20 (×4): 25 ug via INTRAVENOUS
  Administered 2022-06-20: 50 ug via INTRAVENOUS

## 2022-06-20 MED ORDER — HEPARIN SODIUM (PORCINE) 5000 UNIT/ML IJ SOLN
5000.0000 [IU] | Freq: Three times a day (TID) | INTRAMUSCULAR | Status: DC
  Administered 2022-06-20 – 2022-06-28 (×24): 5000 [IU] via SUBCUTANEOUS
  Filled 2022-06-20 (×24): qty 1

## 2022-06-20 MED ORDER — ROCURONIUM BROMIDE 10 MG/ML (PF) SYRINGE
PREFILLED_SYRINGE | INTRAVENOUS | Status: DC | PRN
  Administered 2022-06-20: 80 mg via INTRAVENOUS
  Administered 2022-06-20 (×2): 20 mg via INTRAVENOUS

## 2022-06-20 MED ORDER — MUPIROCIN 2 % EX OINT
1.0000 | TOPICAL_OINTMENT | Freq: Two times a day (BID) | CUTANEOUS | Status: AC
  Administered 2022-06-21 – 2022-06-25 (×10): 1 via NASAL
  Filled 2022-06-20 (×2): qty 22

## 2022-06-20 MED ORDER — HEPARIN SODIUM (PORCINE) 5000 UNIT/ML IJ SOLN
5000.0000 [IU] | INTRAMUSCULAR | Status: AC
  Administered 2022-06-20: 5000 [IU] via SUBCUTANEOUS
  Filled 2022-06-20: qty 1

## 2022-06-20 MED ORDER — HYDROMORPHONE HCL 1 MG/ML IJ SOLN
INTRAMUSCULAR | Status: AC
  Filled 2022-06-20: qty 1

## 2022-06-20 MED ORDER — ONDANSETRON HCL 4 MG/2ML IJ SOLN
INTRAMUSCULAR | Status: AC
  Filled 2022-06-20: qty 2

## 2022-06-20 MED ORDER — LIDOCAINE HCL 2 % IJ SOLN
INTRAMUSCULAR | Status: AC
  Filled 2022-06-20: qty 40

## 2022-06-20 MED ORDER — BUPIVACAINE LIPOSOME 1.3 % IJ SUSP
INTRAMUSCULAR | Status: AC
  Filled 2022-06-20: qty 20

## 2022-06-20 MED ORDER — SUGAMMADEX SODIUM 500 MG/5ML IV SOLN
INTRAVENOUS | Status: AC
  Filled 2022-06-20: qty 15

## 2022-06-20 MED ORDER — MEPERIDINE HCL 50 MG/ML IJ SOLN: 6.2500 mg | INTRAMUSCULAR | Status: DC | PRN

## 2022-06-20 MED ORDER — PHENYLEPHRINE HCL-NACL 20-0.9 MG/250ML-% IV SOLN
INTRAVENOUS | Status: DC | PRN
  Administered 2022-06-20: 40 ug/min via INTRAVENOUS

## 2022-06-20 SURGICAL SUPPLY — 76 items
APPLIER CLIP 5 13 M/L LIGAMAX5 (MISCELLANEOUS)
APPLIER CLIP ROT 10 11.4 M/L (STAPLE)
BAG COUNTER SPONGE SURGICOUNT (BAG) ×2 IMPLANT
BAG LAPAROSCOPIC 12 15 PORT 16 (BASKET) IMPLANT
BAG RETRIEVAL 12/15 (BASKET) ×2
BLADE SURG SZ11 CARB STEEL (BLADE) ×2 IMPLANT
CANNULA REDUC XI 12-8 STAPL (CANNULA) ×2
CANNULA REDUCER 12-8 DVNC XI (CANNULA) ×2 IMPLANT
CHLORAPREP W/TINT 26 (MISCELLANEOUS) ×4 IMPLANT
CLIP APPLIE 5 13 M/L LIGAMAX5 (MISCELLANEOUS) IMPLANT
CLIP APPLIE ROT 10 11.4 M/L (STAPLE) IMPLANT
COVER SURGICAL LIGHT HANDLE (MISCELLANEOUS) ×2 IMPLANT
COVER TIP SHEARS 8 DVNC (MISCELLANEOUS) IMPLANT
COVER TIP SHEARS 8MM DA VINCI (MISCELLANEOUS)
DERMABOND ADVANCED .7 DNX12 (GAUZE/BANDAGES/DRESSINGS) ×2 IMPLANT
DRAPE ARM DVNC X/XI (DISPOSABLE) ×8 IMPLANT
DRAPE COLUMN DVNC XI (DISPOSABLE) ×2 IMPLANT
DRAPE DA VINCI XI ARM (DISPOSABLE) ×8
DRAPE DA VINCI XI COLUMN (DISPOSABLE) ×2
ELECT REM PT RETURN 15FT ADLT (MISCELLANEOUS) ×2 IMPLANT
GAUZE 4X4 16PLY ~~LOC~~+RFID DBL (SPONGE) ×2 IMPLANT
GLOVE BIO SURGEON STRL SZ7.5 (GLOVE) ×4 IMPLANT
GLOVE INDICATOR 8.0 STRL GRN (GLOVE) ×4 IMPLANT
GOWN STRL REUS W/ TWL XL LVL3 (GOWN DISPOSABLE) ×4 IMPLANT
GOWN STRL REUS W/TWL XL LVL3 (GOWN DISPOSABLE) ×4
GRASPER SUT TROCAR 14GX15 (MISCELLANEOUS) ×2 IMPLANT
HEMOSTAT SNOW SURGICEL 2X4 (HEMOSTASIS) IMPLANT
IRRIG SUCT STRYKERFLOW 2 WTIP (MISCELLANEOUS) ×2
IRRIGATION SUCT STRKRFLW 2 WTP (MISCELLANEOUS) ×2 IMPLANT
KIT BASIN OR (CUSTOM PROCEDURE TRAY) ×2 IMPLANT
KIT TURNOVER KIT A (KITS) IMPLANT
LUBRICANT JELLY K Y 4OZ (MISCELLANEOUS) IMPLANT
MARKER SKIN DUAL TIP RULER LAB (MISCELLANEOUS) IMPLANT
MAT PREVALON FULL STRYKER (MISCELLANEOUS) ×2 IMPLANT
NDL SPNL 18GX3.5 QUINCKE PK (NEEDLE) ×2 IMPLANT
NEEDLE SPNL 18GX3.5 QUINCKE PK (NEEDLE) ×2 IMPLANT
OBTURATOR OPTICAL STANDARD 8MM (TROCAR) ×2
OBTURATOR OPTICAL STND 8 DVNC (TROCAR) ×2
OBTURATOR OPTICALSTD 8 DVNC (TROCAR) ×2 IMPLANT
PACK CARDIOVASCULAR III (CUSTOM PROCEDURE TRAY) ×2 IMPLANT
RELOAD STAPLE 60 2.5 WHT DVNC (STAPLE) IMPLANT
RELOAD STAPLE 60 3.5 BLU DVNC (STAPLE) IMPLANT
RELOAD STAPLER 2.5X60 WHT DVNC (STAPLE) ×8 IMPLANT
RELOAD STAPLER 3.5X60 BLU DVNC (STAPLE) ×4 IMPLANT
SCISSORS LAP 5X35 DISP (ENDOMECHANICALS) IMPLANT
SEAL CANN UNIV 5-8 DVNC XI (MISCELLANEOUS) ×6 IMPLANT
SEAL XI 5MM-8MM UNIVERSAL (MISCELLANEOUS) ×6
SEALER VESSEL DA VINCI XI (MISCELLANEOUS) ×2
SEALER VESSEL EXT DVNC XI (MISCELLANEOUS) ×2 IMPLANT
SET TUBE SMOKE EVAC HIGH FLOW (TUBING) ×2 IMPLANT
SHEARS HARMONIC ACE PLUS 45CM (MISCELLANEOUS) IMPLANT
SLEEVE GASTRECTOMY 40FR VISIGI (MISCELLANEOUS) IMPLANT
SOL ANTI FOG 6CC (MISCELLANEOUS) ×2 IMPLANT
SOLUTION ANTI FOG 6CC (MISCELLANEOUS) ×2
SOLUTION ELECTROLUBE (MISCELLANEOUS) ×2 IMPLANT
SPIKE FLUID TRANSFER (MISCELLANEOUS) ×2 IMPLANT
STAPLER 60 DA VINCI SURE FORM (STAPLE) ×2
STAPLER 60 SUREFORM DVNC (STAPLE) ×2 IMPLANT
STAPLER CANNULA SEAL DVNC XI (STAPLE) ×2 IMPLANT
STAPLER CANNULA SEAL XI (STAPLE) ×2
STAPLER RELOAD 2.5X60 WHITE (STAPLE) ×8
STAPLER RELOAD 2.5X60 WHT DVNC (STAPLE) ×8
STAPLER RELOAD 3.5X60 BLU DVNC (STAPLE) ×4
STAPLER RELOAD 3.5X60 BLUE (STAPLE) ×4
SUT MNCRL AB 4-0 PS2 18 (SUTURE) ×4 IMPLANT
SUT VIC AB 0 CT1 27 (SUTURE) ×2
SUT VIC AB 0 CT1 27XBRD ANTBC (SUTURE) ×2 IMPLANT
SUT VIC AB 2-0 SH 27 (SUTURE) ×2
SUT VIC AB 2-0 SH 27XBRD (SUTURE) ×2 IMPLANT
SUT VICRYL 0 TIES 12 18 (SUTURE) IMPLANT
SYR 20ML LL LF (SYRINGE) ×2 IMPLANT
TOWEL OR 17X26 10 PK STRL BLUE (TOWEL DISPOSABLE) ×2 IMPLANT
TRAY FOLEY MTR SLVR 16FR STAT (SET/KITS/TRAYS/PACK) IMPLANT
TROCAR ADV FIXATION 12X100MM (TROCAR) IMPLANT
TROCAR Z-THREAD FIOS 5X100MM (TROCAR) ×2 IMPLANT
TUBE CALIBRATION LAPBAND (TUBING) IMPLANT

## 2022-06-20 NOTE — Telephone Encounter (Signed)
Home Health verbal orders Caller Name: Marcie Bal  Agency Name: [facility] [place] number: [phone]  Requesting OT/PT/Skilled nursing/Social Work/Speech:   Reason: VENTILATOR   Frequency:  Please forward to [facility] pool or providers CMA   Marcie Bal wanted to know would dr. Damita Dunnings sign off for approval for pt's VENTILATOR

## 2022-06-20 NOTE — H&P (Signed)
Admitting Physician: Hyman Hopes Sage Hammill  Service: Bariatric surgery  CC: Obesity  Subjective   HPI: Michael Payne is an 51 y.o. male who is here for robotic sleeve gastrectomy  Past Medical History:  Diagnosis Date   Anemia    borderline anemia, was instructed to eat more iron filled foods   Cardiac enlargement 06/03/2022   Noted on Xray   CHF (congestive heart failure) (HCC) 05/2021   GERD (gastroesophageal reflux disease)    lactose intolerance, patient denies symptoms of reflux   Hip pain    History of kidney stones    HTN (hypertension)    Morbid obesity (HCC)    Obesity    Pneumonia 05/2021   Pre-diabetes    Sleep apnea     Past Surgical History:  Procedure Laterality Date   FOOT SURGERY Right    KNEE ARTHROSCOPY WITH MEDIAL MENISECTOMY Left 12/16/2014   Procedure: KNEE ARTHROSCOPY WITH MEDIAL MENISECTOMY;  Surgeon: Sheral Apley, MD;  Location: [facility] OR;  Service: Orthopedics;  Laterality: Left;    Family History  Problem Relation Age of Onset   Heart attack Mother 24   Lung cancer Father    Cancer Father    CAD Maternal Grandfather     Social:  reports that he quit smoking about 11 years ago. His smoking use included cigarettes. He has a 44.00 pack-year smoking history. He has never used smokeless tobacco. He reports that he does not currently use alcohol. He reports that he does not currently use drugs after having used the following drugs: Marijuana.  Allergies:  Allergies  Allergen Reactions   Aspirin Other (See Comments)    Upset stomach   Lactose Intolerance (Gi) Nausea And Vomiting   Lisinopril     Causes cough    Medications: Current Outpatient Medications  Medication Instructions   acetaminophen (TYLENOL) 650 mg, Oral, Every 6 hours PRN   allopurinol (ZYLOPRIM) 100 mg, Oral, Every other day   amLODipine (NORVASC) 10 mg, Oral, Daily   ascorbic acid (VITAMIN C) 500 mg, Oral, Daily   carvedilol (COREG) 3.125 MG tablet TAKE 1 TABLET  BY MOUTH TWICE A DAY WITH A MEAL   celecoxib (CELEBREX) 100 mg, Oral, 2 times daily   Colchicine (MITIGARE) 0.6 MG CAPS 0.5 tablets, Oral, Daily PRN   furosemide (LASIX) 40 mg, Oral, Daily   Multiple Vitamins-Minerals (MULTIVITAMIN WITH MINERALS) tablet 1 tablet, Oral, Daily   naloxone (NARCAN) nasal spray 4 mg/0.1 mL Spray nostril if needed for opioid overuse- decreased consciousness, decreased respirations   oxyCODONE-acetaminophen (PERCOCET) 10-325 MG tablet 1 tablet, Oral, Every 6 hours PRN   potassium chloride (KLOR-CON M10) 10 MEQ tablet 10 mEq, Oral, Daily   Trulicity 3 mg, Injection, Weekly    ROS - all of the below systems have been reviewed with the patient and positives are indicated with bold text General: chills, fever or night sweats Eyes: blurry vision or double vision ENT: epistaxis or sore throat Allergy/Immunology: itchy/watery eyes or nasal congestion Hematologic/Lymphatic: bleeding problems, blood clots or swollen lymph nodes Endocrine: temperature intolerance or unexpected weight changes Breast: new or changing breast lumps or nipple discharge Resp: cough, shortness of breath, or wheezing CV: chest pain or dyspnea on exertion GI: as per HPI GU: dysuria, trouble voiding, or hematuria MSK: joint pain or joint stiffness Neuro: TIA or stroke symptoms Derm: pruritus and skin lesion changes Psych: anxiety and depression  Objective   PE There were no vitals taken for this visit. Constitutional:  NAD; conversant; no deformities Eyes: Moist conjunctiva; no lid lag; anicteric; PERRL Neck: Trachea midline; no thyromegaly Lungs: Normal respiratory effort; no tactile fremitus CV: RRR; no palpable thrills; no pitting edema GI: Abd Soft, nontender MSK: Warm, well perfused Psychiatric: Appropriate affect; alert and oriented x3 Lymphatic: No palpable cervical or axillary lymphadenopathy  No results found for this or any previous visit (from the past 24  hour(s)).  Imaging Orders  No imaging studies ordered today     Assessment and Plan   Michael Payne is a 51 y.o. male who is seen for bariatric surgery consultation. The patient has morbid obesity with a BMI of 59.64, and the following conditions related to obesity: GERD, HTN, Obstructive Sleep Apnea, decreased mobility (nonambulatory).  We discussed the surgical options to treat obesity and its associated comorbidity. After discussing the available procedures in the region, we discussed in great detail the surgeries I offer: robotic sleeve gastrectomy and robotic roux-en-y gastric bypass. We discussed the procedures themselves as well as their risks, benefits and alternatives. I entered the patient's basic information into the [facility] Metabolic Surgery Risk/Benefit Calculator to facilitate this discussion.   After a full discussion and all questions answered, the patient is interested in pursuing a robotic sleeve gastrectomy.  Here's a review of his preoperative pathway:  - Bloodwork  - Bariatric nutrition education - Completed with Serena Colonel 02/21/22  - Chest x-ray - 06/10/21 1. Limited study due to patient body habitus and patient rotation. 2. Similar cardiomegaly, probable small left pleural effusion, and overlying left basilar opacities. 3. Possible superimposed edema and pulmonary vascular congestion, which may be mildly improved.  - EKG 07/26/22 - NSR no ST abnormalities  - Preoperative cardiac evaluation - Dr. Elease Hashimoto 07/26/21 "1. Acute hypercarbic respiratory failure: Olanrewaju was admitted to the hospital in September with acute hypercarbic respiratory failure. He was massively volume overloaded. Echocardiogram revealed normal systolic and normal diastolic function. The echo was technically difficult because of his morbid obesity. He was diuresed over 26 L. He was previously eating a very high salt and unrestricted diet for each meal. He was diagnosed with obstructive sleep  apnea and is now wearing BiPAP. He is currently able to lie flat without any significant problems. We we are asked to provide preoperative evaluation prior to bariatric surgery. Were not able to perform any additional stress testing or functional testing because of his morbid obesity. His echocardiogram in the hospital several months ago revealed normal LV systolic and diastolic function. Clinically he is quite a bit better. He is able to lie flat without any significant problems. He is completely bedbound for the past 2 years and has not been able to walk at all. He has no ability to exercise. Given his inability and his excessive weight, I would still would classify him at moderate risk for cardiovascular complications. He certainly has no signs of volume overload at this time. Our team will be available to see him in the hospital for consultation if needed. He will need to continue with his salt restricted diet and weight loss program. We will see him back on an as-needed basis. 2.Essential hypertension: His blood pressure is fairly well controlled. 3.Morbid obesity: going for bariatriac surgery There is no way to really test him for ischemia given his morbid obesity. He would be at moderate - high risk for anesthesia given his size but there are no further tests we could perform that would allow [place] to reduce that risk . "  -  Surface echo 03/12/2018: SUMMARY  The left ventricle is mildly dilated.   Left ventricular systolic function is normal.  LV ejection fraction = 55-60%.  The left ventricular wall motion is normal.   Left ventricular filling pattern is normal.  The right ventricle is normal size.  The right ventricular systolic function is normal.  There is no significant valvular stenosis or regurgitation.  Mild aortic root dilatation.  Mildly dilated ascending aorta.  There is no pericardial effusion.  There is no comparison study available.   - Psychology evaluation  - Physical  therapy - has been performing in home physical therapy and recently took some steps, making progress.  - Upper GI x-ray 08/16/17 at [place]: CONCLUSION: Normal double-contrast upper GI examination.     Today he presents for robotic sleeve gastrectomy with upper endoscopy.  We discussed the procedure, its risks, benefits and alternatives and the patient granted consent to proceed. We will proceed as scheduled.    Felicie Morn, MD  [facility] Use [email or web address] to contact on call provider

## 2022-06-20 NOTE — Progress Notes (Signed)
PHARMACY CONSULT FOR:  Risk Assessment for Post-Discharge VTE Following Bariatric Surgery  Post-Discharge VTE Risk Assessment: This patient's probability of 30-day post-discharge VTE is increased due to the factors marked: x Sleeve gastrectomy   Liver disorder (transplant, cirrhosis, or nonalcoholic steatohepatitis)   Hx of VTE   Hemorrhage requiring transfusion   GI perforation, leak, or obstruction   ====================================================  x  Male    Age >/=60 years   x BMI >/=50 kg/m2    CHF    Dyspnea at Rest    Paraplegia  x  Non-gastric-band surgery  x  Operation Time >/=3 hr    Return to OR     Length of Stay >/= 3 d   Hypercoagulable condition   Significant venous stasis    Predicted probability of 30-day post-discharge VTE: at least 0.8% moderate risk Other patient-specific factors to consider: see above  Recommendation for Discharge: Enoxaparin 60 mg Veblen q12h x 2 weeks post-discharge   Michael Payne is a 51 y.o. male who underwent 06/20/22: robotic sleeve gastrectomy    Case start: 1355 Case end: 1715   Allergies  Allergen Reactions   Aspirin Other (See Comments)    Upset stomach   Lactose Intolerance (Gi) Nausea And Vomiting   Lisinopril     Causes cough    Patient Measurements: Height: 6\' 4"  (193 cm) Weight: (!) 226.1 kg (498 lb 8 oz) IBW/kg (Calculated) : 86.8 Body mass index is 60.68 kg/m.  Recent Labs    06/20/22 1753  WBC 12.9*  HGB 13.8  HCT 45.0  PLT 232  CREATININE 0.85   Estimated Creatinine Clearance: 209.6 mL/min (by C-G formula based on SCr of 0.85 mg/dL).    Past Medical History:  Diagnosis Date   Anemia    borderline anemia, was instructed to eat more iron filled foods   Cardiac enlargement 06/03/2022   Noted on Xray   CHF (congestive heart failure) ([place]) 05/2021   GERD (gastroesophageal reflux disease)    lactose intolerance, patient denies symptoms of reflux   Hip pain    History of kidney stones     HTN (hypertension)    Morbid obesity ([place])    Obesity    Pneumonia 05/2021   Pre-diabetes    Sleep apnea    [name] Highland, PharmD, BCPS Clinical Staff Pharmacist [email or web address]   Alford Highland, [employer] 06/20/2022,6:44 PM

## 2022-06-20 NOTE — Plan of Care (Signed)
Discussed with patient plan of care for the evening, pain management and regiment with admission questions answered with some teach back displayed.  Problem: Education: Goal: Knowledge of General Education information will improve Description: Including pain rating scale, medication(s)/side effects and non-pharmacologic comfort measures Outcome: Progressing   Problem: Health Behavior/Discharge Planning: Goal: Ability to manage health-related needs will improve Outcome: Progressing

## 2022-06-20 NOTE — Op Note (Signed)
Patient: Michael Payne ([birth year], [id number])  Date of Surgery: 06/20/2022   Preoperative Diagnosis: Morbid obesity   Postoperative Diagnosis: Morbid obesity   Surgical Procedure: XI ROBOTIC SLEEVE GASTRECTOMY: 42683 (CPT) UPPER GI ENDOSCOPY: [id number]   Operative Team Members:  Surgeon(s) and Role:    * Landers Prajapati, [name], MD - Primary    * Kinsinger, Arta Bruce, MD - Assisting   Anesthesiologist: Janeece Riggers, MD CRNA: Gean Maidens, CRNA   Anesthesia: General   Fluids:  Total I/O In: 1100 [I.V.:1000; IV Piggyback:100] Out: 225 [Urine:200; LNLGX:21]  Complications: None  Drains:  none   Specimen:  ID Type Source Tests Collected by Time Destination  1 : STOMACH Tissue PATH Other SURGICAL PATHOLOGY Allexus Ovens, [name], MD 06/20/2022 1631      Disposition:  PACU - hemodynamically stable.  Plan of Care: Admit to inpatient     Indications for Procedure: Michael Payne is a 51 y.o. male who is seen for bariatric surgery consultation. The patient has morbid obesity with a BMI of 59.64, and the following conditions related to obesity: GERD, HTN, Obstructive Sleep Apnea, decreased mobility (nonambulatory).  We discussed the surgical options to treat obesity and its associated comorbidity. After discussing the available procedures in the region, we discussed in great detail the surgeries I offer: robotic sleeve gastrectomy and robotic roux-en-y gastric bypass. We discussed the procedures themselves as well as their risks, benefits and alternatives. I entered the patient's basic information into the [facility] Metabolic Surgery Risk/Benefit Calculator to facilitate this discussion.   After a full discussion and all questions answered, the patient is interested in pursuing a robotic sleeve gastrectomy.  Here's a review of his preoperative pathway:  - Bloodwork  - Bariatric nutrition education - Completed with Sandie Ano 02/21/22  - Chest x-ray - 06/10/21 1.  Limited study due to patient body habitus and patient rotation. 2. Similar cardiomegaly, probable small left pleural effusion, and overlying left basilar opacities. 3. Possible superimposed edema and pulmonary vascular congestion, which may be mildly improved.  - EKG 07/26/22 - NSR no ST abnormalities  - Preoperative cardiac evaluation - Dr. Acie Fredrickson 11/7/22Raquel Sarna Monge 06/15/22 - considered moderate risk for surgery  - Psychology evaluation  - Physical therapy - has been performing in home physical therapy and recently took some steps, making progress.  - Upper GI x-ray 08/16/17 at [place]: CONCLUSION: Normal double-contrast upper GI examination.       Today he presents for robotic sleeve gastrectomy with upper endoscopy.  We discussed the procedure, its risks, benefits and alternatives and the patient granted consent to proceed. We will proceed as scheduled.   Findings: Normal anatomy.  Large liver in patient with high BMI - unable to rectract with nathanson so tip-up grasper used for liver retraction for the majority of the case.  Infection status: Patient: Private Patient Elective Case Case: Elective Infection Present At Time Of Surgery (PATOS): None   Description of Procedure:   On the date stated above, the patient was taken to the operating room suite and placed in supine positioning.  General endotracheal anesthesia was induced.  A timeout was completed verifying the correct patient, procedure, positioning and equipment needed for the case.  The patient's abdomen was prepped and draped in the usual sterile fashion.  I entered the patient's right upper quadrant using a 5 mm trocar in the optical technique.  There was no trauma to underlying viscera with initial trocar placement.  The abdomen was insufflated  15 mmHg.  A total of 4 robotic trochars were placed across the mid abdomen, including the 5 mm initial trocar being upsized to a 8 mm trocar.  The robotic stapler trocar  was placed in the number two position.  The [employer] liver retractor was placed through the subxiphoid region and under the left lobe of the liver and was connected to the rail of the bed.  A TAP block was placed using marcaine and Exparel under direct vision of the laparoscope.  The [employer] XI robotic platform was docked and we transitioned to robotic surgery.   I started 6 cm away from the pylorus along the greater curve the stomach and divided the gastroepiploic vessels and the gastrocolic ligament.  The lesser sac was entered.  There were really no adhesions to the posterior wall of the stomach.  The greater curve was mobilized working superiorly toward the spleen.  All of the gastroepiploic and short gastric vessels were divided as we divided the gastrocolic and gastrosplenic ligaments.  The liver retractor was unable to be appropriately positioned, so the tip up grasper was used for retraction for the majority of the case.  As we reached the splenic hilum, I lifted the stomach anteriorly.  I created a tunnel between the stomach and its attachments to the retroperitoneum posteriorly just to the left of the GE junction until I encountered the left crus and my previous angle of His dissection.  We then were able to approach the shortest of the short gastrics both from the greater curve the stomach laterally and from the left crus medially.  These were divided using the Vessel Sealer and the fundus of the stomach was fully mobilized.  With the stomach fully mobilized we direct our attention to stapling.  A 40 French VISI G was inserted into the stomach and positioned along the lesser curve the stomach and suction was applied.  The 60 mm robotic sureform linear stapler was used to create the sleeve gastrectomy.  We started 6 cm from the pylorus and were careful to avoid narrowing at the incisura.  We stayed about 1 cm away from the GE junction to protect the sling fibers.  We used two blue and multiple white  loads of the linear stapler.  With the sleeve gastrectomy completed the VISI G was taken off suction and removed and we performed an upper endoscopy.  The intra-abdominal pressure was decreased to to check hemostasis.  The foregut was submerged in saline irrigation and the adult upper endoscope was inserted into the stomach as far as the pylorus to inspect the sleeve.  The sleeve appeared appropriately oriented without any twisting.  There was good hemostasis.  The sleeve was widely patent at the incisura with no narrowing.  There was no significant retained fundus.  The sleeve was inflated with the endoscope and there was no bubbling of the irrigation, suggesting a negative leak test and a airtight sleeve gastrectomy.  The foregut was decompressed with the endoscope and the endoscope was removed.  An omentopexy was performed, using running 2-0 vicryl suture to connect the inferior two staple lines to the divided omentum. There was good hemostasis at the end of the case.  The robot was undocked and moved away from the field.  The sleeve gastrectomy specimen was removed from the stapler port.  The fascia of the stapler port was closed using a 0 Vicryl on a PMI suture passer.   The liver retractor was removed under direct vision.  The pneumoperitoneum was evacuated.  The skin was closed using 4-0 Monocryl and Dermabond.  All sponge and needle counts were correct at the end of the case.    Ivar Drape, MD General, Bariatric, & Minimally Invasive Surgery [facility], [place]

## 2022-06-20 NOTE — Discharge Instructions (Addendum)
GASTRIC BYPASS / SLEEVE  Home Care Instructions  These instructions are to help you care for yourself when you go home.  Call: If you have any problems. Call [phone] and ask for the surgeon on call If you have an emergency related to your surgery please use the ER at [place].  Tell the ER staff that you are a new post-op gastric bypass or gastric sleeve patient   Signs and symptoms to report: Severe vomiting or nausea If you cannot handle clear liquids for longer than 1 day, call your surgeon  Abdominal pain which does not get better after taking your pain medication Fever greater than 100.4 F and chills Heart rate over 100 beats a minute Trouble breathing Chest pain  Redness, swelling, drainage, or foul odor at incision (surgical) sites  If your incisions open or pull apart Swelling or pain in calf (lower leg) Diarrhea (Loose bowel movements that happen often), frequent watery, uncontrolled bowel movements Constipation, (no bowel movements for 3 days) if this happens:  Take Milk of Magnesia, 2 tablespoons by mouth, 3 times a day for 2 days if needed Stop taking Milk of Magnesia once you have had a bowel movement Call your doctor if constipation continues Or Take Miralax  (instead of Milk of Magnesia) following the label instructions Stop taking Miralax once you have had a bowel movement Call your doctor if constipation continues Anything you think is "abnormal for you"   Normal side effects after surgery: Unable to sleep at night or unable to concentrate Irritability Being tearful (crying) or depressed These are common complaints, possibly related to your anesthesia, stress of surgery and change in lifestyle, that usually go away a few weeks after surgery.  If these feelings continue, call your medical doctor.  Wound Care: You may have surgical glue, steri-strips, or staples over your incisions after surgery Surgical glue:  Looks like a clear film over your incisions  and will wear off a little at a time Steri-strips : Adhesive strips of tape over your incisions. You may notice a yellowish color on the skin under the steri-strips. This is used to make the   steri-strips stick better. Do not pull the steri-strips off - let them fall off Staples: Staples may be removed before you leave the hospital If you go home with staples, call [facility] for an appointment with your surgeon's nurse to have staples removed 10 days after surgery, [phone] Showering: You may shower two (2) days after your surgery unless your surgeon tells you differently Wash gently around incisions with warm soapy water, rinse well, and gently pat dry  If you have a drain (tube from your incision), you may need someone to hold this while you shower  No tub baths until staples are removed and incisions are healed     Medications: Medications should be liquid or crushed if larger than the size of a dime Extended release pills (medication that releases a little bit at a time through the day) should not be crushed Depending on the size and number of medications you take, you may need to space (take a few throughout the day)/change the time you take your medications so that you do not over-fill your pouch (smaller stomach) Make sure you follow-up with your primary care physician to make medication changes needed during rapid weight loss and life-style changes If you have diabetes, follow up with the doctor that orders your diabetes medication(s) within one week after surgery and check   your blood sugar regularly. Do not drive while taking narcotics (pain medications) DO NOT take NSAID'S (Examples of NSAID's include ibuprofen, naproxen)  Diet:                    First 2 Weeks  You will see the nutritionist about two (2) weeks after your surgery. The nutritionist will increase the types of foods you can eat if you are handling liquids well: If you have severe vomiting or nausea  and cannot handle clear liquids lasting longer than 1 day, call your surgeon  Protein Shake Drink at least 2 ounces of shake 5-6 times per day Each serving of protein shakes (usually 8 - 12 ounces) should have a minimum of:  15 grams of protein  And no more than 5 grams of carbohydrate  Goal for protein each day: Men = 80 grams per day Women = 60 grams per day Protein powder may be added to fluids such as non-fat milk or Lactaid milk or Soy milk (limit to 35 grams added protein powder per serving)  Hydration Slowly increase the amount of water and other clear liquids as tolerated (See Acceptable Fluids) Slowly increase the amount of protein shake as tolerated   Sip fluids slowly and throughout the day May use sugar substitutes in small amounts (no more than 6 - 8 packets per day; i.e. Splenda)  Fluid Goal The first goal is to drink at least 8 ounces of protein shake/drink per day (or as directed by the nutritionist);  See handout from pre-op Bariatric Education Class for examples of protein shake/drink.   Slowly increase the amount of protein shake you drink as tolerated You may find it easier to slowly sip shakes throughout the day It is important to get your proteins in first Your fluid goal is to drink 64 - 100 ounces of fluid daily It may take a few weeks to build up to this 32 oz (or more) should be clear liquids  And  32 oz (or more) should be full liquids (see below for examples) Liquids should not contain sugar, caffeine, or carbonation  Clear Liquids: Water or Sugar-free flavored water (i.e. Fruit H2O, Propel) Decaffeinated coffee or tea (sugar-free) Bubba Vanbenschoten Lite, Wyler's Lite, Minute Maid Lite Sugar-free Jell-O Bouillon or broth Sugar-free Popsicle:   *Less than 20 calories each; Limit 1 per day  Full Liquids: Protein Shakes/Drinks + 2 choices per day of other full liquids Full liquids must be: No More Than 12 grams of Carbs per serving  No More Than 3 grams of Fat  per serving Strained low-fat cream soup Non-Fat milk Fat-free Lactaid Milk Sugar-free yogurt (Dannon Lite & Fit, Greek yogurt)      Vitamins and Minerals Start 1 day after surgery unless otherwise directed by your surgeon Bariatric Specific Complete Multivitamins Chewable Calcium Citrate with Vitamin D-3 (Example: 3 Chewable Calcium Plus 600 with Vitamin D-3) Take 500 mg three (3) times a day for a total of 1500 mg each day Do not take all 3 doses of calcium at one time as it may cause constipation, and you can only absorb 500 mg  at a time  Do not mix multivitamins containing iron with calcium supplements; take 2 hours apart  Menstruating women and those at risk for anemia (a blood disease that causes weakness) may need extra iron Talk with your doctor to see if you need more iron If you need extra iron: Total daily Iron recommendation (including Vitamins) is 50 to 100   mg Iron/day Do not stop taking or change any vitamins or minerals until you talk to your nutritionist or surgeon Your nutritionist and/or surgeon must approve all vitamin and mineral supplements   Activity and Exercise: It is important to continue walking at home.  Limit your physical activity as instructed by your doctor.  During this time, use these guidelines: Do not lift anything greater than ten (10) pounds for at least two (2) weeks Do not go back to work or drive until your surgeon says you can You may have sex when you feel comfortable  It is VERY important for male patients to use a reliable birth control method; fertility often increases after surgery  Do not get pregnant for at least 18 months Start exercising as soon as your doctor tells you that you can Make sure your doctor approves any physical activity Start with a simple walking program Walk 5-15 minutes each day, 7 days per week.  Slowly increase until you are walking 30-45 minutes per day Consider joining our BELT program. [phone] or email  [email or web address]   Special Instructions Things to remember:  Use your CPAP when sleeping if this applies to you, do not stop the use of CPAP unless directed by physician after a sleep study [facility] has a free Bariatric Surgery Support Group that meets monthly, the 3rd Thursday, 6 pm.  Please review discharge information for date and location of this meeting. It is very important to keep all follow up appointments with your surgeon, nutritionist, primary care physician, and behavioral health practitioner After the first year, please follow up with your bariatric surgeon and nutritionist at least once a year in order to maintain best weight loss results   [facility]: [phone] [facility]: [phone] Bariatric Nurse Coordinator: [phone]     Enoxaparin (Lovenox) Self- Injection Instructions Wash your hands with soap and water Find a spot on the left or right side of the abdomen  2 inches from umbilicus "belly button"- DO NOT INJECT INTO INCISIONS Make sure to rotate sites (Do not inject in the same spot twice in a row) Clean the spot with alcohol swab, LET DRY Pull cap straight off, do not twist (do not let needle touch anything)Ok to leave in air bubble Place syringe in dominant (writing) hand, take other hand to "pinch an inch" (same area that you cleaned with the alcohol swab) Insert the entire needle into the fold of skin at a 90-degree angle Press the plunger all the way down with your thumb while the other hand keeps "pinching" the skin (Make sure to get all the medicine) Pull the needle straight out, then let go of the skin To activate the safety shield, hold the plunger away from yourself (and anyone else if necessary) Press with your thumb until you hear a "click" and the safety shield activates Place the used syringe and cap into the sharps disposal container.  

## 2022-06-20 NOTE — Anesthesia Postprocedure Evaluation (Signed)
Anesthesia Post Note  Patient: [name]  Procedure(s) Performed: XI ROBOTIC SLEEVE GASTRECTOMY (Abdomen) UPPER GI ENDOSCOPY (Esophagus)     Patient location during evaluation: PACU Anesthesia Type: General Level of consciousness: awake and alert Pain management: pain level controlled Vital Signs Assessment: post-procedure vital signs reviewed and stable Respiratory status: spontaneous breathing, nonlabored ventilation, respiratory function stable and patient connected to nasal cannula oxygen Cardiovascular status: blood pressure returned to baseline and stable Postop Assessment: no apparent nausea or vomiting Anesthetic complications: no   No notable events documented.  Last Vitals:  Vitals:   06/20/22 1900 06/20/22 1915  BP: (!) 181/100 (!) 173/94  Pulse: 74 72  Resp: 18 18  Temp:    SpO2: 95% 93%    Last Pain:  Vitals:   06/20/22 1905  TempSrc:   PainSc: 10-Worst pain ever                 Delfin Squillace

## 2022-06-20 NOTE — Anesthesia Procedure Notes (Signed)
Procedure Name: Intubation Date/Time: 06/20/2022 2:17 PM  Performed by: Gean Maidens, CRNAPre-anesthesia Checklist: Patient identified, Emergency Drugs available, Suction available, Patient being monitored and Timeout performed Patient Re-evaluated:Patient Re-evaluated prior to induction Oxygen Delivery Method: Circle system utilized Preoxygenation: Pre-oxygenation with 100% oxygen Induction Type: IV induction Ventilation: Mask ventilation without difficulty Laryngoscope Size: Glidescope and 4 Grade View: Grade I Tube type: Oral Tube size: 7.5 mm Number of attempts: 1 Airway Equipment and Method: Video-laryngoscopy Placement Confirmation: ETT inserted through vocal cords under direct vision, positive ETCO2 and breath sounds checked- equal and bilateral Secured at: 24 cm Tube secured with: Tape Dental Injury: Teeth and Oropharynx as per pre-operative assessment

## 2022-06-20 NOTE — Transfer of Care (Signed)
Immediate Anesthesia Transfer of Care Note  Patient: Michael Payne  Procedure(s) Performed: XI ROBOTIC SLEEVE GASTRECTOMY (Abdomen) UPPER GI ENDOSCOPY (Esophagus)  Patient Location: PACU  Anesthesia Type:General  Level of Consciousness: sedated, patient cooperative and responds to stimulation  Airway & Oxygen Therapy: Patient Spontanous Breathing and Patient connected to face mask oxygen  Post-op Assessment: Report given to RN and Post -op Vital signs reviewed and stable  Post vital signs: Reviewed and stable  Last Vitals:  Vitals Value Taken Time  BP 156/109 06/20/22 1717  Temp    Pulse 78 06/20/22 1722  Resp 16 06/20/22 1722  SpO2 98 % 06/20/22 1722  Vitals shown include unvalidated device data.  Last Pain:  Vitals:   06/20/22 1318  TempSrc:   PainSc: 0-No pain         Complications: No notable events documented.

## 2022-06-21 ENCOUNTER — Encounter (HOSPITAL_COMMUNITY): Payer: Self-pay | Admitting: Surgery

## 2022-06-21 ENCOUNTER — Telehealth (HOSPITAL_COMMUNITY): Payer: Self-pay | Admitting: Pharmacy Technician

## 2022-06-21 ENCOUNTER — Other Ambulatory Visit (HOSPITAL_COMMUNITY): Payer: Self-pay

## 2022-06-21 LAB — CBC WITH DIFFERENTIAL/PLATELET
Abs Immature Granulocytes: 0.08 10*3/uL — ABNORMAL HIGH (ref 0.00–0.07)
Basophils Absolute: 0 10*3/uL (ref 0.0–0.1)
Basophils Relative: 0 %
Eosinophils Absolute: 0 10*3/uL (ref 0.0–0.5)
Eosinophils Relative: 0 %
HCT: 42.8 % (ref 39.0–52.0)
Hemoglobin: 13.1 g/dL (ref 13.0–17.0)
Immature Granulocytes: 1 %
Lymphocytes Relative: 7 %
Lymphs Abs: 1 10*3/uL (ref 0.7–4.0)
MCH: 29.6 pg (ref 26.0–34.0)
MCHC: 30.6 g/dL (ref 30.0–36.0)
MCV: 96.8 fL (ref 80.0–100.0)
Monocytes Absolute: 0.4 10*3/uL (ref 0.1–1.0)
Monocytes Relative: 3 %
Neutro Abs: 12.6 10*3/uL — ABNORMAL HIGH (ref 1.7–7.7)
Neutrophils Relative %: 89 %
Platelets: 250 10*3/uL (ref 150–400)
RBC: 4.42 MIL/uL (ref 4.22–5.81)
RDW: 13.9 % (ref 11.5–15.5)
WBC: 14.1 10*3/uL — ABNORMAL HIGH (ref 4.0–10.5)
nRBC: 0 % (ref 0.0–0.2)

## 2022-06-21 LAB — GLUCOSE, CAPILLARY
Glucose-Capillary: 130 mg/dL — ABNORMAL HIGH (ref 70–99)
Glucose-Capillary: 95 mg/dL (ref 70–99)
Glucose-Capillary: 99 mg/dL (ref 70–99)
Glucose-Capillary: 104 mg/dL — ABNORMAL HIGH (ref 70–99)

## 2022-06-21 LAB — BASIC METABOLIC PANEL
Anion gap: 8 (ref 5–15)
BUN: 13 mg/dL (ref 6–20)
CO2: 28 mmol/L (ref 22–32)
Calcium: 8.2 mg/dL — ABNORMAL LOW (ref 8.9–10.3)
Chloride: 102 mmol/L (ref 98–111)
Creatinine, Ser: 0.79 mg/dL (ref 0.61–1.24)
GFR, Estimated: >60 mL/min (ref >60)
Glucose, Bld: 151 mg/dL — ABNORMAL HIGH (ref 70–99)
Potassium: 4.6 mmol/L (ref 3.5–5.1)
Sodium: 138 mmol/L (ref 135–145)

## 2022-06-21 MED ORDER — LACTASE 3000 UNITS PO TABS
6000.0000 [IU] | ORAL_TABLET | Freq: Three times a day (TID) | ORAL | Status: DC
  Administered 2022-06-22 (×2): 6000 [IU] via ORAL
  Filled 2022-06-21 (×6): qty 2

## 2022-06-21 MED ORDER — HYDRALAZINE HCL 20 MG/ML IJ SOLN
10.0000 mg | INTRAMUSCULAR | Status: DC | PRN
  Administered 2022-06-21 – 2022-06-22 (×2): 10 mg via INTRAVENOUS
  Filled 2022-06-21 (×2): qty 1

## 2022-06-21 MED ORDER — ENOXAPARIN SODIUM 60 MG/0.6ML IJ SOSY: 60.0000 mg | PREFILLED_SYRINGE | Freq: Two times a day (BID) | INTRAMUSCULAR | 0 refills | Status: DC

## 2022-06-21 NOTE — Progress Notes (Signed)
Patient had SBP 160-175's last night.  Gave Coreg 0800 medication early last SBP 169.  Patient also takes Norvasc this AM at 1000. Will pass onto next shift in report.

## 2022-06-21 NOTE — Progress Notes (Signed)
Physical Therapy Treatment Patient Details Name: Michael Payne MRN: [id number] DOB: [birth year] Today's Date: 06/21/2022   History of Present Illness Mr. Medlen is a 51 year old underwent robotic sleeve gastrectomy with upper endoscopy on 06/20/22. PHMx: morbid obesity, GERD, HTN, and sleep apnea.    PT Comments    Kreg EZ Wider bed delivered, patient assisted to transfer/slide onto be.  Instructed in hand control.  Bed designed to allow patient  to  mobilize to sitting then egress from foot of bed as safely  and as patient tolerates.  Continue mobility,progressive ambulation.   Recommendations for follow up therapy are one component of a multi-disciplinary discharge planning process, led by the attending physician.  Recommendations may be updated based on patient status, additional functional criteria and insurance authorization.  Follow Up Recommendations  Acute inpatient rehab (3hours/day)     Assistance Recommended at Discharge Intermittent Supervision/Assistance  Patient can return home with the following A lot of help with walking and/or transfers;A little help with bathing/dressing/bathroom;Help with stairs or ramp for entrance;Assistance with cooking/housework;Assist for transportation   Equipment Recommendations  None recommended by PT    Recommendations for Other Services       Precautions / Restrictions Precautions Precautions: Fall Restrictions Weight Bearing Restrictions: No     Mobility  Bed Mobility Overal bed mobility: Needs Assistance Bed Mobility: Rolling, Sidelying to Sit, Sit to Supine Rolling: Max assist, +2 for physical assistance, +2 for safety/equipment Sidelying to sit: Max assist, +2 for safety/equipment, +2 for physical assistance, HOB elevated   Sit to supine: Mod assist   General bed mobility comments: patient assisted with slide  to Kreg EZ wider bed to  promote mobility. Patient rolled to each side, able to pull self up in bed using UE's and  rails on bed.    Transfers Overall transfer level: Needs assistance Equipment used: Rolling walker (2 wheels) Transfers: Sit to/from Stand Sit to Stand: +2 safety/equipment, +2 physical assistance, From elevated surface, Max assist           General transfer comment: + 3 for safety  and support recliner  so pt. could pull up to stand. Patient able to slowly rise to stand for ~ 30 second intervals.    Ambulation/Gait Ambulation/Gait assistance:  (+3 to hold recliner back secure so pt. could pull up)                 Stairs             Wheelchair Mobility    Modified Rankin (Stroke Patients Only)       Balance Overall balance assessment: Needs assistance Sitting-balance support: Single extremity supported, Feet supported Sitting balance-Leahy Scale: Fair     Standing balance support: During functional activity, Reliant on assistive device for balance, Bilateral upper extremity supported Standing balance-Leahy Scale: Poor Standing balance comment: reliant on support at back of recliner.                            Cognition Arousal/Alertness: Awake/alert Behavior During Therapy: WFL for tasks assessed/performed Overall Cognitive Status: Within Functional Limits for tasks assessed                                 General Comments: very motivated to progress activity        Exercises      General Comments  Pertinent Vitals/Pain Pain Assessment Pain Assessment: Faces Faces Pain Scale: Hurts whole lot Pain Location: Left side abdomen with movement Pain Descriptors / Indicators: Discomfort, Guarding, Grimacing, Pressure, Sharp Pain Intervention(s): Patient requesting pain meds-RN notified, Monitored during session    Home Living Family/patient expects to be discharged to:: Private residence Living Arrangements: Children Available Help at Discharge: Family;Available 24 hours/day;Personal care attendant Type of Home:  House         Home Layout: One level Home Equipment: Conservation officer, nature (2 wheels);Wheelchair - manual;Hospital bed Additional Comments: pt spends time in bed or sitting on bed edge; reports his RW is a "dinosaur" steel that is wide enough for him that his [facility] therapist ordered for him off of [employer]--very heavy (works well for him)    Prior Function            PT Goals (current goals can now be found in the care plan section) Progress towards PT goals: Progressing toward goals    Frequency    Min 3X/week      PT Plan Current plan remains appropriate    Co-evaluation   Reason for Co-Treatment: Complexity of the patient's impairments (multi-system involvement);For patient/therapist safety;To address functional/ADL transfers PT goals addressed during session: Mobility/safety with mobility;Strengthening/ROM;Proper use of DME;Balance OT goals addressed during session: ADL's and self-care;Strengthening/ROM      AM-PAC PT "6 Clicks" Mobility   Outcome Measure  Help needed turning from your back to your side while in a flat bed without using bedrails?: A Lot Help needed moving from lying on your back to sitting on the side of a flat bed without using bedrails?: A Lot Help needed moving to and from a bed to a chair (including a wheelchair)?: A Lot Help needed standing up from a chair using your arms (e.g., wheelchair or bedside chair)?: Total Help needed to walk in hospital room?: Total Help needed climbing 3-5 steps with a railing? : Total 6 Click Score: 9    End of Session   Activity Tolerance: Patient tolerated treatment well Patient left: in bed;with call bell/phone within reach Nurse Communication: Mobility status;Need for lift equipment PT Visit Diagnosis: Unsteadiness on feet (R26.81);Difficulty in walking, not elsewhere classified (R26.2);Pain Pain - Right/Left: Right Pain - part of body: Shoulder     Time: 1405-1450 PT Time Calculation (min) (ACUTE ONLY): 45  min  Charges:  $Therapeutic Activity: 38-52 mins                     [place] Office [phone] Weekend [id number]   Claretha Cooper 06/21/2022, 2:59 PM

## 2022-06-21 NOTE — Evaluation (Signed)
Physical Therapy Evaluation Patient Details Name: Michael Payne MRN: [id number] DOB: 1970-12-13 Today's Date: 06/21/2022  History of Present Illness  Michael Payne is a 51 year old underwent robotic sleeve gastrectomy with upper endoscopy on 06/20/22. PHMx: morbid obesity, GERD, HTN, and sleep apnea.  Clinical Impression  The patient  admitted for above elective  gastric sleeve surgery.  Patient is very   motivated to  mobilize.  Patient  able to sit onto bed edge and stood x 3 with UE support on back of recliner.  Patient reports that he did ambulate 98' 2 times a week when HHPT visited.  Patient has family support.  Pt admitted with above diagnosis. . Pt currently with functional limitations due to the deficits listed below (see PT Problem List). Pt will benefit from skilled PT to increase their independence and safety with mobility to allow discharge to the venue listed below.    Patient's SPO2 >95%on Ra , HR up to 115 with activity.       Recommendations for follow up therapy are one component of a multi-disciplinary discharge planning process, led by the attending physician.  Recommendations may be updated based on patient status, additional functional criteria and insurance authorization.  Follow Up Recommendations Acute inpatient rehab (3hours/day)      Assistance Recommended at Discharge Intermittent Supervision/Assistance  Patient can return home with the following  A lot of help with walking and/or transfers;A little help with bathing/dressing/bathroom;Help with stairs or ramp for entrance;Assistance with cooking/housework;Assist for transportation    Equipment Recommendations None recommended by PT  Recommendations for Other Services       Functional Status Assessment Patient has had a recent decline in their functional status and demonstrates the ability to make significant improvements in function in a reasonable and predictable amount of time.     Precautions /  Restrictions Precautions Precautions: Fall      Mobility  Bed Mobility Overal bed mobility: Needs Assistance Bed Mobility: Rolling, Sidelying to Sit, Sit to Supine Rolling: Mod assist Sidelying to sit: Max assist, +2 for safety/equipment, +2 for physical assistance, HOB elevated   Sit to supine: Mod assist   General bed mobility comments: cues to roll  partially to each side and to left to prep to sit up. + 2 max to pull to sit up, use of bed rail.  Patient returned self to supine and placed his  legs onto bed. Patient able to use head board to slide up into bed.    Transfers Overall transfer level: Needs assistance Equipment used: Rolling walker (2 wheels) Transfers: Sit to/from Stand Sit to Stand: +2 safety/equipment, +2 physical assistance, From elevated surface, Max assist           General transfer comment: + 3 for safety  and support recliner  so pt. could pull up to stand. Patient able to slowly rise to stand for ~ 30 second intervals.    Ambulation/Gait Ambulation/Gait assistance:  (+3 to hold recliner back secure so pt. could pull up)                Stairs            Wheelchair Mobility    Modified Rankin (Stroke Patients Only)       Balance Overall balance assessment: Needs assistance Sitting-balance support: Single extremity supported, Feet supported Sitting balance-Leahy Scale: Fair     Standing balance support: During functional activity, Reliant on assistive device for balance, Bilateral upper extremity supported Standing balance-Leahy  Scale: Poor Standing balance comment: reliant on support at back of recliner.                             Pertinent Vitals/Pain Pain Assessment Pain Assessment: Faces Faces Pain Scale: Hurts whole lot Pain Location: Left side abdomen with movement Pain Descriptors / Indicators: Discomfort, Guarding, Grimacing, Pressure, Sharp Pain Intervention(s): Monitored during session, Premedicated  before session    Home Living Family/patient expects to be discharged to:: Private residence Living Arrangements: Children Available Help at Discharge: Family;Available 24 hours/day;Personal care attendant Type of Home: House         Home Layout: One level Home Equipment: Agricultural consultant (2 wheels);Wheelchair - manual;Hospital bed Additional Comments: pt spends time in bed or sitting on bed edge.    Prior Function Prior Level of Function : Needs assist       Physical Assist : Mobility (physical);ADLs (physical) Mobility (physical): Bed mobility;Gait;Transfers   Mobility Comments: pt. repo that he ambukated 69' 2 x's week with HH therapies, sat on  side of bed frequently, ADLs Comments: sponge bath , HH aide     Hand Dominance   Dominant Hand: Right    Extremity/Trunk Assessment        Lower Extremity Assessment Lower Extremity Assessment: Generalized weakness    Cervical / Trunk Assessment Cervical / Trunk Assessment: Other exceptions;Normal Cervical / Trunk Exceptions: body habitus  somewhat limiting with mobility  on air mattress  Communication   Communication: No difficulties  Cognition Arousal/Alertness: Awake/alert Behavior During Therapy: WFL for tasks assessed/performed Overall Cognitive Status: Within Functional Limits for tasks assessed                                 General Comments: very motivated to progress activity        General Comments      Exercises     Assessment/Plan    PT Assessment Patient needs continued PT services  PT Problem List Decreased strength;Decreased mobility;Obesity;Decreased activity tolerance;Pain       PT Treatment Interventions DME instruction;Therapeutic activities;Gait training;Therapeutic exercise;Patient/family education;Functional mobility training    PT Goals (Current goals can be found in the Care Plan section)  Acute Rehab PT Goals Patient Stated Goal: to walk out of the hospital PT  Goal Formulation: With patient Time For Goal Achievement: 07/05/22 Potential to Achieve Goals: Good    Frequency Min 3X/week     Co-evaluation PT/OT/SLP Co-Evaluation/Treatment: Yes Reason for Co-Treatment: Complexity of the patient's impairments (multi-system involvement);For patient/therapist safety;To address functional/ADL transfers PT goals addressed during session: Mobility/safety with mobility OT goals addressed during session: ADL's and self-care       AM-PAC PT "6 Clicks" Mobility  Outcome Measure Help needed turning from your back to your side while in a flat bed without using bedrails?: A Lot Help needed moving from lying on your back to sitting on the side of a flat bed without using bedrails?: A Lot Help needed moving to and from a bed to a chair (including a wheelchair)?: A Lot Help needed standing up from a chair using your arms (e.g., wheelchair or bedside chair)?: Total Help needed to walk in hospital room?: Total Help needed climbing 3-5 steps with a railing? : Total 6 Click Score: 9    End of Session   Activity Tolerance: Patient tolerated treatment well Patient left: in bed;with call bell/phone within reach  Nurse Communication: Mobility status PT Visit Diagnosis: Unsteadiness on feet (R26.81);Difficulty in walking, not elsewhere classified (R26.2)    Time: 3212-2482 PT Time Calculation (min) (ACUTE ONLY): 44 min   Charges:   PT Evaluation $PT Eval Moderate Complexity: 1 Mod          Blanchard Kelch PT Acute Rehabilitation Services Office [phone] Weekend pager-[phone]   Rada Hay 06/21/2022, 10:27 AM

## 2022-06-21 NOTE — Progress Notes (Signed)
   Inpatient Rehab Admissions Coordinator :  Per therapy recommendations patient was screened for CIR candidacy by Demaya Hardge RN MSN. Patient is not yet at a level to tolerate the intensity required to pursue a CIR admit . Patient may have the potential to progress to become a candidate. The CIR admissions team will follow and monitor for progress and place a Rehab Consult order if felt to be appropriate. Please contact me with any questions.  Chellsea Beckers RN MSN Admissions Coordinator [phone]  

## 2022-06-21 NOTE — Telephone Encounter (Signed)
Spoke with [employer]. Person I spoke with did not know why they called other then needing a contact number for the patient. The number they had did not work. I advised them patient is currently in the hospital and we are not sure why ventilator is needed or for how long. Hassell Done stated that they do not need approval from our office since they already have the ventilator order.

## 2022-06-21 NOTE — Evaluation (Signed)
Occupational Therapy Evaluation Patient Details Name: Michael Payne MRN: [id number] DOB: 1970-11-02 Today's Date: 06/21/2022   History of Present Illness Michael Payne is a 51 year old underwent robotic sleeve gastrectomy with upper endoscopy on 06/20/22. PHMx: morbid obesity, GERD, HTN, and sleep apnea.   Clinical Impression   This 51 yo male admitted and underwent above presents to acute OT with PLOF of being able to sit up on EOB by himself (bariatric hospital bed), stand and ambulate with +1 A up to 40 feet with HH services. He normally has a HH to do a sponge bath in bed and they also A him with dressing if he is going out of the house (otherwise he does not wear clothes). He currently needs increased A due to pain from surgery and also deconditioning but is VERY motivated to get up and moving and better his life. He reports he has lost 120# since about a year ago and had surgery because he wants to get even healthier. He reports he had a dream that he walked out of [facility]. He will continue to benefit from acute OT with follow up on AIR.      Recommendations for follow up therapy are one component of a multi-disciplinary discharge planning process, led by the attending physician.  Recommendations may be updated based on patient status, additional functional criteria and insurance authorization.   Follow Up Recommendations  Acute inpatient rehab (3hours/day)    Assistance Recommended at Discharge Frequent or constant Supervision/Assistance  Patient can return home with the following Two people to help with walking and/or transfers;Two people to help with bathing/dressing/bathroom;Assistance with cooking/housework;Help with stairs or ramp for entrance;Assist for transportation    Functional Status Assessment  Patient has had a recent decline in their functional status and demonstrates the ability to make significant improvements in function in a reasonable and predictable amount  of time.  Equipment Recommendations  Other (comment) (TBD next venue)    Recommendations for Other Services Rehab consult     Precautions / Restrictions Precautions Precautions: Fall Restrictions Weight Bearing Restrictions: No      Mobility Bed Mobility Overal bed mobility: Needs Assistance Bed Mobility: Rolling, Sidelying to Sit, Sit to Supine Rolling: Mod assist Sidelying to sit: Max assist, +2 for safety/equipment, +2 for physical assistance, HOB elevated   Sit to supine: Mod assist   General bed mobility comments: cues to roll  partially to each side and to left to prep to sit up. + 2 max to pull to sit up, use of bed rail.  Patient returned self to supine and placed his  legs onto bed. Patient able to use head board to slide up into bed.    Transfers Overall transfer level: Needs assistance Equipment used: Rolling walker (2 wheels) Transfers: Sit to/from Stand Sit to Stand: +2 safety/equipment, +2 physical assistance, From elevated surface, Max assist           General transfer comment: + 3 for safety  and support recliner  so pt. could pull up to stand. Patient able to slowly rise to stand for ~ 30 second intervals.      Balance Overall balance assessment: Needs assistance Sitting-balance support: Single extremity supported, Feet supported Sitting balance-Leahy Scale: Fair     Standing balance support: During functional activity, Reliant on assistive device for balance, Bilateral upper extremity supported Standing balance-Leahy Scale: Poor Standing balance comment: reliant on support at back of recliner.  ADL either performed or assessed with clinical judgement   ADL Overall ADL's : Needs assistance/impaired Eating/Feeding: Independent;Bed level Eating/Feeding Details (indicate cue type and reason): or sitting EOB Grooming: Set up;Sitting Grooming Details (indicate cue type and reason): EOB Upper Body Bathing:  Moderate assistance Upper Body Bathing Details (indicate cue type and reason): bed level with HOB up Lower Body Bathing: Total assistance;Bed level   Upper Body Dressing : Moderate assistance;Sitting   Lower Body Dressing: Total assistance;Bed level                       Vision Patient Visual Report: No change from baseline              Pertinent Vitals/Pain Pain Assessment Pain Assessment: Faces Faces Pain Scale: Hurts whole lot Pain Location: Left side abdomen with movement Pain Descriptors / Indicators: Discomfort, Guarding, Grimacing, Pressure, Sharp Pain Intervention(s): Monitored during session, Premedicated before session, Repositioned     Hand Dominance Right   Extremity/Trunk Assessment Upper Extremity Assessment Upper Extremity Assessment: Overall WFL for tasks assessed   Lower Extremity Assessment Lower Extremity Assessment: Generalized weakness   Cervical / Trunk Assessment Cervical / Trunk Assessment: Other exceptions;Normal Cervical / Trunk Exceptions: body habitus  somewhat limiting with mobility  on air mattress   Communication Communication Communication: No difficulties   Cognition Arousal/Alertness: Awake/alert Behavior During Therapy: WFL for tasks assessed/performed Overall Cognitive Status: Within Functional Limits for tasks assessed                                 General Comments: very motivated to progress activity                Home Living Family/patient expects to be discharged to:: Private residence Living Arrangements: Children Available Help at Discharge: Family;Available 24 hours/day;Personal care attendant Type of Home: House       Home Layout: One level     Bathroom Shower/Tub:  (does bed bath with HHaide)   Bathroom Toilet: Standard     Home Equipment: Agricultural consultant (2 wheels);Wheelchair - manual;Hospital bed   Additional Comments: pt spends time in bed or sitting on bed edge; reports his  RW is a "dinosaur" steel that is wide enough for him that his [facility] therapist ordered for him off of [employer]--very heavy (works well for him)      Prior Functioning/Environment Prior Level of Function : Needs assist       Physical Assist : Mobility (physical);ADLs (physical) Mobility (physical): Bed mobility;Gait;Transfers ADLs (physical): Bathing;Toileting Mobility Comments: pt. reports that he ambulated 40' 2 x's week with HH therapies, sat on side of bed frequently, ADLs Comments: sponge bath , HH aide; does not wear clothes at home (sheet covers him); does wear clothes to appointments        OT Problem List: Decreased range of motion;Decreased strength;Impaired balance (sitting and/or standing);Pain;Obesity      OT Treatment/Interventions: Self-care/ADL training;DME and/or AE instruction;Patient/family education;Balance training    OT Goals(Current goals can be found in the care plan section) Acute Rehab OT Goals Patient Stated Goal: to be able to walk out of the hospital OT Goal Formulation: With patient Time For Goal Achievement: 07/05/22 Potential to Achieve Goals: Good  OT Frequency: Min 2X/week    Co-evaluation PT/OT/SLP Co-Evaluation/Treatment: Yes Reason for Co-Treatment: Complexity of the patient's impairments (multi-system involvement);For patient/therapist safety;To address functional/ADL transfers PT goals addressed during session: Mobility/safety with mobility;Strengthening/ROM;Proper  use of DME;Balance OT goals addressed during session: ADL's and self-care;Strengthening/ROM      AM-PAC OT "6 Clicks" Daily Activity     Outcome Measure Help from another person eating meals?: None Help from another person taking care of personal grooming?: A Little Help from another person toileting, which includes using toliet, bedpan, or urinal?: Total Help from another person bathing (including washing, rinsing, drying)?: A Lot Help from another person to put on and taking off  regular upper body clothing?: A Little Help from another person to put on and taking off regular lower body clothing?: Total 6 Click Score: 14   End of Session Equipment Utilized During Treatment:  (recliner in front of him with someone in recliner for extra weight) Nurse Communication: Mobility status (RNs and techs (not his were in room A'ing [place]); we did let his RN know he stood 3 times)  Activity Tolerance: Patient tolerated treatment well Patient left: in bed;with call bell/phone within reach  OT Visit Diagnosis: Unsteadiness on feet (R26.81);Other abnormalities of gait and mobility (R26.89);Muscle weakness (generalized) (M62.81);Pain Pain - part of body:  (incisional)                Time: 9628-3662 OT Time Calculation (min): 44 min Charges:  OT General Charges $OT Visit: 1 Visit OT Evaluation $OT Eval Moderate Complexity: 1 Mod OT Treatments $Self Care/Home Management : 8-22 mins  Golden Circle, OTR/L Acute Rehab Services [facility] [phone] Office [phone]    Almon Register 06/21/2022, 1:56 PM

## 2022-06-21 NOTE — Telephone Encounter (Signed)
What is the status re: his ventilator?  Is this going to be an ongoing issue?  Is he followed by pulmonary for this?  Please let me know.

## 2022-06-21 NOTE — Telephone Encounter (Signed)
I printed and placed PPW they are requesting in your inbox

## 2022-06-21 NOTE — Telephone Encounter (Signed)
Pharmacy Patient Advocate Encounter  Insurance verification completed.    The patient is insured through [employer] Part D   The patient is currently admitted and ran test claims for the following: enoxaprin (Lovenox) .  Copays and coinsurance results were relayed to Inpatient clinical team.

## 2022-06-21 NOTE — Progress Notes (Signed)
Patient alert and oriented, Post op day 1.  Provided bariatric education and support to patient and nursing staff.  Discussed QI "Goals for Discharge" document with patient and nursing staff including ambulation in halls (in pt's case, working with PT/OT), Incentive Spirometry use every hour (pt was able to perform up to 2000), and oral care.  Also discussed pain and nausea control.  Will continue to partner with bedside RN and follow up with patient per protocol. All questions answered.  Will continue to monitor.

## 2022-06-21 NOTE — TOC Benefit Eligibility Note (Signed)
Patient Teacher, English as a foreign language completed.    The patient is currently admitted and upon discharge could be taking enoxaprin (Lovenox) 60 mg/0.6 ml.  The current 14 day co-pay is $0.00.   The patient is insured through [place] Patient Advocate Specialist [place] Patient Advocate Team Direct Number: [phone]  Fax: [phone]

## 2022-06-21 NOTE — Telephone Encounter (Signed)
Marcie Bal from [employer] called asking if the papers from them had gotten to [place] & signed/approved by Dr. Damita Dunnings? I told Marcie Bal how Janett Billow spoke to someone regarding the orders. Marcie Bal stated it could've just been a Therapist, art rep. Marcie Bal stated the ppw was faxed over 06/16/22 & that the ppw was still needed to be approved by Damita Dunnings & sent back. Marcie Bal offered to fax over more ppw for Damita Dunnings to look over regarding the issue. Marcie Bal stated the insurance just needs approved orders every year. Pt received ventilator 06/2021. Call back # [phone].

## 2022-06-21 NOTE — Progress Notes (Signed)
Progress Note: General Surgery Service   Chief Complaint/Subjective: Doing okay POD1.  Some nausea overnight.  Abdominal pain.    Objective: Vital signs in last 24 hours: Temp:  [97 F (36.1 C)-99.3 F (37.4 C)] 97 F (36.1 C) (10/03 0336) Pulse Rate:  [66-91] 87 (10/03 0700) Resp:  [13-20] 16 (10/03 0700) BP: (152-197)/(84-111) 181/99 (10/03 0700) SpO2:  [93 %-100 %] 98 % (10/03 0700) Weight:  [226.1 kg] 226.1 kg (10/02 1254) Last BM Date :  (PTA)  Intake/Output from previous day: 10/02 0701 - 10/03 0700 In: 3082 [P.O.:125; I.V.:2857; IV Piggyback:100] Out: 2025 [Urine:2000; Blood:25] Intake/Output this shift: No intake/output data recorded.  GI: Abd incisions c/d/I w/ glue  Lab Results: CBC  Recent Labs    06/20/22 1753 06/20/22 2131 06/21/22 0256  WBC 12.9*  --  14.1*  HGB 13.8 13.8 13.1  HCT 45.0 45.7 42.8  PLT 232  --  250   BMET Recent Labs    06/20/22 1753 06/21/22 0256  NA  --  138  K  --  4.6  CL  --  102  CO2  --  28  GLUCOSE  --  151*  BUN  --  13  CREATININE 0.85 0.79  CALCIUM  --  8.2*   PT/INR No results for input(s): "LABPROT", "INR" in the last 72 hours. ABG No results for input(s): "PHART", "HCO3" in the last 72 hours.  Invalid input(s): "PCO2", "PO2"  Anti-infectives: Anti-infectives (From admission, onward)    Start     Dose/Rate Route Frequency Ordered Stop   06/20/22 1300  cefoTEtan (CEFOTAN) 2 g in sodium chloride 0.9 % 100 mL IVPB        2 g 200 mL/hr over 30 Minutes Intravenous On call to O.R. 06/20/22 1249 06/20/22 1515       Medications: Scheduled Meds:  acetaminophen  1,000 mg Oral Q8H   Or   acetaminophen (TYLENOL) oral liquid 160 mg/5 mL  1,000 mg Oral Q8H   allopurinol  100 mg Oral QODAY   amLODipine  10 mg Oral Daily   carvedilol  3.125 mg Oral BID WC   Chlorhexidine Gluconate Cloth  6 each Topical Daily   heparin injection (subcutaneous)  5,000 Units Subcutaneous Q8H   HYDROmorphone       insulin aspart   0-20 Units Subcutaneous TID WC   insulin aspart  0-5 Units Subcutaneous QHS   mupirocin ointment  1 Application Nasal BID   pantoprazole (PROTONIX) IV  40 mg Intravenous QHS   Ensure Max Protein  2 oz Oral Q2H   Continuous Infusions:  lactated ringers 75 mL/hr at 06/21/22 0700   PRN Meds:.HYDROmorphone, HYDROmorphone (DILAUDID) injection, ondansetron (ZOFRAN) IV, mouth rinse, oxyCODONE, simethicone  Assessment/Plan: Mr. Edgell is a 51 year old nonambulatory male with morbid obesity, GERD, HTN, and sleep apnea.  He underwent robotic sleeve gastrectomy with upper endoscopy on 06/20/22.    Doing okay POD1 Continue bariatric protocol PT/OT - nonambulatory at baseline Case management assistance with safe discharge plan - home vs. Rehab Hold lasix for now, dehydration is a common issue after sleeve gastrectomy, but will need to watch for signs of heart failure - monitor urine output   LOS: 1 day    Felicie Morn, MD  [facility] Use [email or web address] to contact on call provider  Daily Billing: [phone] - post op

## 2022-06-22 LAB — CBC WITH DIFFERENTIAL/PLATELET
Abs Immature Granulocytes: 0.03 10*3/uL (ref 0.00–0.07)
Basophils Absolute: 0 10*3/uL (ref 0.0–0.1)
Basophils Relative: 0 %
Eosinophils Absolute: 0.1 10*3/uL (ref 0.0–0.5)
Eosinophils Relative: 1 %
HCT: 40.4 % (ref 39.0–52.0)
Hemoglobin: 12.2 g/dL — ABNORMAL LOW (ref 13.0–17.0)
Immature Granulocytes: 0 %
Lymphocytes Relative: 19 %
Lymphs Abs: 1.9 10*3/uL (ref 0.7–4.0)
MCH: 29.3 pg (ref 26.0–34.0)
MCHC: 30.2 g/dL (ref 30.0–36.0)
MCV: 96.9 fL (ref 80.0–100.0)
Monocytes Absolute: 0.8 10*3/uL (ref 0.1–1.0)
Monocytes Relative: 9 %
Neutro Abs: 7 10*3/uL (ref 1.7–7.7)
Neutrophils Relative %: 71 %
Platelets: 226 10*3/uL (ref 150–400)
RBC: 4.17 MIL/uL — ABNORMAL LOW (ref 4.22–5.81)
RDW: 14.3 % (ref 11.5–15.5)
WBC: 9.8 10*3/uL (ref 4.0–10.5)
nRBC: 0 % (ref 0.0–0.2)

## 2022-06-22 LAB — BASIC METABOLIC PANEL
Anion gap: 7 (ref 5–15)
BUN: 11 mg/dL (ref 6–20)
CO2: 29 mmol/L (ref 22–32)
Calcium: 8.4 mg/dL — ABNORMAL LOW (ref 8.9–10.3)
Chloride: 102 mmol/L (ref 98–111)
Creatinine, Ser: 0.76 mg/dL (ref 0.61–1.24)
GFR, Estimated: >60 mL/min (ref >60)
Glucose, Bld: 118 mg/dL — ABNORMAL HIGH (ref 70–99)
Potassium: 4.2 mmol/L (ref 3.5–5.1)
Sodium: 138 mmol/L (ref 135–145)

## 2022-06-22 LAB — SURGICAL PATHOLOGY

## 2022-06-22 LAB — GLUCOSE, CAPILLARY
Glucose-Capillary: 127 mg/dL — ABNORMAL HIGH (ref 70–99)
Glucose-Capillary: 124 mg/dL — ABNORMAL HIGH (ref 70–99)
Glucose-Capillary: 97 mg/dL (ref 70–99)
Glucose-Capillary: 114 mg/dL — ABNORMAL HIGH (ref 70–99)

## 2022-06-22 MED ORDER — DOCUSATE SODIUM 100 MG PO CAPS
100.0000 mg | ORAL_CAPSULE | Freq: Two times a day (BID) | ORAL | Status: DC
  Administered 2022-06-22 – 2022-06-26 (×4): 100 mg via ORAL
  Filled 2022-06-22 (×9): qty 1

## 2022-06-22 NOTE — Progress Notes (Addendum)
This nurse attempted to administer PO Lactaid to this patient. The patient informed this nurse that he took his home Lactaid medication at 0800 (located in patient's personal bag at bedside). The patient's bedside Lactaid was 9,000 u. This nurse educated the patient not to take any home medications while in the hospital and to inform staff of any other medication he has taken. The patient stated that was the only medication he had taken on his own. This nurse placed the medication in the patient's large personal bag located on the by the door. The patient gave his verbal understanding to not take any home medications while hospitalized. This nurse did not administer the scheduled 6,000 u Lactaid this AM and informed provider.   Called on call provider to inquire about cardiac monitoring and starting patient on a bowel regimen of a stool softener. See new orders.

## 2022-06-22 NOTE — Progress Notes (Addendum)
  Inpatient Rehabilitation Admissions Coordinator   Spoke with patient by phone for rehab assessment. We discussed goals and expectations of a possible CIR admit. Prior to 06/2021 he had been bed bound for about 2 years. Over the past year, he has lost about 130 pounds, works with therapy at home and can ambulate about 55 feet with his therapist twice weekly. Otherwise he completes exercises with therabands and dumbbells daily at bed level. His daughter, Jonn Shingles works from home and he has a Water quality scientist 2 1/2 hrs per day to assist with adls. One level home with 5 step entry, but he has not been mobile enough to do his steps in 3 years. 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 He prefers CIR level rehab to begin his postoperative recovery before he returns home. I will discuss with CIR medical Director before proceeding. If he is in agreement, I will then begin insurance [employer] [employer] for possible admit if approved. He prefers CIR for rehab and SNF is unlikely due to his bariatric needs. He will have his Cousin bring in his bariatric walker from home tomorrow to assist with his mobility progress at [facility]. I will follow up tomorrow. Please call me with any questions.   Danne Baxter, RN, MSN Rehab Admissions Coordinator [phone]

## 2022-06-22 NOTE — TOC Initial Note (Signed)
Transition of Care [facility]) - Initial/Assessment Note    Patient Details  Name: Michael Payne MRN: [id number] Date of Birth: 1971/03/12  Transition of Care [facility]) CM/SW Contact:    Otelia Santee, LCSW Phone Number: 06/22/2022, 1:32 PM  Clinical Narrative:                 PT currently recommended for AIR however, is not medically ready. CIR to reevaluate for eligibility once medically stable. TOC will continue to follow for discharge planning.  Expected Discharge Plan: IP Rehab Facility Barriers to Discharge: Continued Medical Work up   Patient Goals and CMS Choice Patient states their goals for this hospitalization and ongoing recovery are:: Unable to assess   Choice offered to / list presented to : Patient  Expected Discharge Plan and Services Expected Discharge Plan: IP Rehab Facility In-house Referral: NA Discharge Planning Services: CM Consult Post Acute Care Choice: IP Rehab Living arrangements for the past 2 months: Single Family Home                 DME Arranged: N/A DME Agency: NA                  Prior Living Arrangements/Services Living arrangements for the past 2 months: Single Family Home Lives with:: Self Patient language and need for interpreter reviewed:: Yes Do you feel safe going back to the place where you live?: Yes      Need for Family Participation in Patient Care: No (Comment) Care giver support system in place?: Yes (comment) Current home services: DME, Other (comment) (PCS through [facility]) Criminal Activity/Legal Involvement Pertinent to Current Situation/Hospitalization: No - Comment as needed  Activities of Daily Living Home Assistive Devices/Equipment: Walker (specify type) ADL Screening (condition at time of admission) Patient's cognitive ability adequate to safely complete daily activities?: Yes Is the patient deaf or have difficulty hearing?: No Does the patient have difficulty seeing, even when wearing glasses/contacts?:  Yes Does the patient have difficulty concentrating, remembering, or making decisions?: No Patient able to express need for assistance with ADLs?: Yes Does the patient have difficulty dressing or bathing?: No Independently performs ADLs?: Yes (appropriate for developmental age) Communication: Independent Dressing (OT): Independent with device (comment) Grooming: Independent Feeding: Independent Bathing: Dependent Toileting: Independent with device (comment) In/Out Bed: Independent with device (comment) Walks in Home: Independent with device (comment) Does the patient have difficulty walking or climbing stairs?: Yes Weakness of Legs: Both Weakness of Arms/Hands: None  Permission Sought/Granted   Permission granted to share information with : No              Emotional Assessment Appearance:: Other (Comment Required (Unable to assess) Attitude/Demeanor/Rapport: Unable to Assess Affect (typically observed): Unable to Assess Orientation: : Oriented to Self, Oriented to Place, Oriented to  Time, Oriented to Situation Alcohol / Substance Use: Not Applicable Psych Involvement: No (comment)  Admission diagnosis:  Morbid obesity with BMI of 60.0-69.9, adult (HCC) [E66.01, Z68.44] Patient Active Problem List   Diagnosis Date Noted   Morbid obesity with BMI of 60.0-69.9, adult (HCC) 06/20/2022   Right shoulder pain 05/11/2022   Hordeolum externum of right eye 02/18/2022   Nonobstructive transaminitis 06/28/2021   Decubitus ulcer of posterior thigh, stage 2 (HCC) 06/28/2021   Acute kidney injury (resolved) 06/28/2021   Pre-diabetes 06/10/2021   Obesity, Class III, BMI 40-49.9 (morbid obesity) (HCC) 06/10/2021   Osteoarthritis 06/10/2021   Pressure injury of skin 06/09/2021   Respiratory failure (HCC) 06/01/2021  Acute diastolic CHF (congestive heart failure) ([place]) 05/31/2021   CHF (congestive heart failure) ([place]) 05/31/2021   Hypoxia 05/31/2021   Gout, unspecified 03/10/2021    Edema 03/10/2021   Diabetes mellitus without complication ([place]) 16/06/9603   Muscle spasms of both lower extremities 07/29/2020   Bedbound 05/22/2020   Unilateral osteoarthritis of hip, right 12/27/2019   Severe obstructive sleep apnea 02/22/2018   Hypertriglyceridemia 08/24/2017   Low HDL (under 40) 54/05/8118   Metabolic syndrome 14/78/2956   Morbid obesity ([place]) 10/14/2013   HTN (hypertension) 10/14/2013   PCP:  [name], MD Pharmacy:   [id number] [address], [place] [address] Phone: [phone] Fax: [phone]  [facility] [place], [place] [address] Phone: [phone] Fax: [phone]     Social Determinants of Health (SDOH) Interventions    Readmission Risk Interventions    06/22/2022    1:29 PM 06/28/2021    9:01 AM 06/23/2021   10:53 AM  Readmission Risk Prevention Plan  Post Dischage Appt Complete Complete Complete  Medication Screening Complete Complete Complete  Transportation Screening Complete Complete Complete

## 2022-06-22 NOTE — Progress Notes (Signed)
Progress Note: General Surgery Service   Chief Complaint/Subjective: Doing well POD2.  Some pain and nausea.  Starting on protein.   Objective: Vital signs in last 24 hours: Temp:  [98 F (36.7 C)-98.2 F (36.8 C)] 98.2 F (36.8 C) (10/04 0422) Pulse Rate:  [78-98] 89 (10/04 0700) Resp:  [13-20] 15 (10/04 0700) BP: (109-181)/(65-97) 136/89 (10/04 0700) SpO2:  [95 %-100 %] 100 % (10/04 0700) Last BM Date :  (PTA)  Intake/Output from previous day: 10/03 0701 - 10/04 0700 In: 1636.9 [I.V.:1636.9] Out: 2360 [Urine:2360] Intake/Output this shift: Total I/O In: 138.4 [I.V.:138.4] Out: -   GI: Abd incisions c/d/I w/ glue  Lab Results: CBC  Recent Labs    06/21/22 0256 06/22/22 0323  WBC 14.1* 9.8  HGB 13.1 12.2*  HCT 42.8 40.4  PLT 250 226    BMET Recent Labs    06/21/22 0256 06/22/22 0323  NA 138 138  K 4.6 4.2  CL 102 102  CO2 28 29  GLUCOSE 151* 118*  BUN 13 11  CREATININE 0.79 0.76  CALCIUM 8.2* 8.4*    PT/INR No results for input(s): "LABPROT", "INR" in the last 72 hours. ABG No results for input(s): "PHART", "HCO3" in the last 72 hours.  Invalid input(s): "PCO2", "PO2"  Anti-infectives: Anti-infectives (From admission, onward)    Start     Dose/Rate Route Frequency Ordered Stop   06/20/22 1300  cefoTEtan (CEFOTAN) 2 g in sodium chloride 0.9 % 100 mL IVPB        2 g 200 mL/hr over 30 Minutes Intravenous On call to O.R. 06/20/22 1249 06/20/22 1515       Medications: Scheduled Meds:  acetaminophen  1,000 mg Oral Q8H   Or   acetaminophen (TYLENOL) oral liquid 160 mg/5 mL  1,000 mg Oral Q8H   allopurinol  100 mg Oral QODAY   amLODipine  10 mg Oral Daily   carvedilol  3.125 mg Oral BID WC   Chlorhexidine Gluconate Cloth  6 each Topical Daily   heparin injection (subcutaneous)  5,000 Units Subcutaneous Q8H   insulin aspart  0-20 Units Subcutaneous TID WC   insulin aspart  0-5 Units Subcutaneous QHS   lactase  6,000 Units Oral TID WC    mupirocin ointment  1 Application Nasal BID   pantoprazole (PROTONIX) IV  40 mg Intravenous QHS   Ensure Max Protein  2 oz Oral Q2H   Continuous Infusions:  lactated ringers 75 mL/hr at 06/22/22 0706   PRN Meds:.hydrALAZINE, HYDROmorphone (DILAUDID) injection, ondansetron (ZOFRAN) IV, mouth rinse, oxyCODONE, simethicone  Assessment/Plan: Michael Payne is a 51 year old nonambulatory male with morbid obesity, GERD, HTN, and sleep apnea.  He underwent robotic sleeve gastrectomy with upper endoscopy on 06/20/22.    Doing okay POD2 Continue bariatric protocol PT/OT - nonambulatory at baseline Case management assistance with safe discharge plan Hold lasix for now, dehydration is a common issue after sleeve gastrectomy, but will need to watch for signs of heart failure - monitor urine output Remove foley Once fluid intake goals are achieved, may be ready for discharge from hospital.  Hopeful for inpatient rehab, patient excited about this idea and I think this would be the best next step in his recovery.     LOS: 2 days    Michael Morn, MD  [facility] Use [email or web address] to contact on call provider  Daily Billing: [phone] - post op

## 2022-06-22 NOTE — Telephone Encounter (Signed)
This will need to come through the prev managing provider or pulmonary.  Let me know if he needs a referral to pulmonary.  Thanks.

## 2022-06-22 NOTE — Progress Notes (Signed)
Physical Therapy Treatment Patient Details Name: Michael Payne MRN: [id number] DOB: 1971/09/03 Today's Date: 06/22/2022   History of Present Illness Michael Payne is a 51 year old underwent robotic sleeve gastrectomy with upper endoscopy on 06/20/22. PHMx: morbid obesity, GERD, HTN, and sleep apnea.    PT Comments    The patient  is now on the [place] EZ wider bed.  Bed adjusted so patient  was sitting upright and feet on floor. Patient reports sitting up is very relieving.  Attempted x 2 to stand from foot of bed, Pulling on recliner, pt. Reports PT RW not wide enough. unable to clear buttocks, reports increased pain in abdomen.  Patient has TB for UE exercises.  Patient left in seated chair position of bed, patient has controls to adjust for comfort.  Plan: to  dangle from bed edge and work on standing from side of bed, then  patient can sit up in bed chair position for comfort. Transfer to low recliner is not advisable as a mechanical lift would be necessary to return to bed and current specialty bed  can accommodate chair position to facilitate recovery from gastric surgery. Continue standing and progress  ambulation.  Recommendations for follow up therapy are one component of a multi-disciplinary discharge planning process, led by the attending physician.  Recommendations may be updated based on patient status, additional functional criteria and insurance authorization.  Follow Up Recommendations  Acute inpatient rehab (3hours/day)     Assistance Recommended at Discharge Intermittent Supervision/Assistance  Patient can return home with the following A lot of help with walking and/or transfers;A little help with bathing/dressing/bathroom;Help with stairs or ramp for entrance;Assistance with cooking/housework;Assist for transportation   Equipment Recommendations  None recommended by PT    Recommendations for Other Services       Precautions / Restrictions Precautions Precautions: Fall      Mobility  Bed Mobility               General bed mobility comments: patient able to  use HOB rails to slide self up. with bed in chair position,Patient used bilateral rails  to pull self forward into sitting upright, required support of sheet around trunk to assist initiation of forward lean. patient performed x 4  subsequently with mod assistnace.    Transfers Overall transfer level: Needs assistance Equipment used: Rolling walker (2 wheels) Transfers: Sit to/from Stand             General transfer comment: + 3 for safety  and support recliner  so pt. could pull up to stand. Patient able to slowly rise to partially stand briefly x 2 from the  tilted EZ wider egress bed.    Ambulation/Gait                   Stairs             Wheelchair Mobility    Modified Rankin (Stroke Patients Only)       Balance   Sitting-balance support: Single extremity supported, Feet supported Sitting balance-Leahy Scale: Fair     Standing balance support: During functional activity, Reliant on assistive device for balance, Bilateral upper extremity supported                                Cognition Arousal/Alertness: Awake/alert  Exercises Other Exercises Other Exercises: patient perform Tb UE exercises independently Other Exercises: LAQ, hip flexion seated x 20 while n chair position of bed    General Comments        Pertinent Vitals/Pain Pain Assessment Faces Pain Scale: Hurts whole lot Pain Location: Left side abdomen with movement Pain Descriptors / Indicators: Discomfort, Guarding, Grimacing, Pressure, Sharp Pain Intervention(s): Patient requesting pain meds-RN notified    Home Living                          Prior Function            PT Goals (current goals can now be found in the care plan section) Progress towards PT goals: Progressing toward goals     Frequency    Min 3X/week      PT Plan Current plan remains appropriate    Co-evaluation              AM-PAC PT "6 Clicks" Mobility   Outcome Measure  Help needed turning from your back to your side while in a flat bed without using bedrails?: A Lot Help needed moving from lying on your back to sitting on the side of a flat bed without using bedrails?: A Lot Help needed moving to and from a bed to a chair (including a wheelchair)?: A Lot Help needed standing up from a chair using your arms (e.g., wheelchair or bedside chair)?: Total Help needed to walk in hospital room?: Total Help needed climbing 3-5 steps with a railing? : Total 6 Click Score: 9    End of Session   Activity Tolerance: Patient tolerated treatment well Patient left: in bed;with call bell/phone within reach Nurse Communication: Mobility status;Need for lift equipment (trying to find a bariatric sling for lift.) PT Visit Diagnosis: Unsteadiness on feet (R26.81);Difficulty in walking, not elsewhere classified (R26.2);Pain     Time: 7169-6789 PT Time Calculation (min) (ACUTE ONLY): 66 min  Charges:  $Therapeutic Exercise: 8-22 mins $Therapeutic Activity: 53-67 mins                     [place] Office [phone] Weekend [phone]    Michael Payne 06/22/2022, 3:00 PM

## 2022-06-23 LAB — GLUCOSE, CAPILLARY
Glucose-Capillary: 125 mg/dL — ABNORMAL HIGH (ref 70–99)
Glucose-Capillary: 119 mg/dL — ABNORMAL HIGH (ref 70–99)
Glucose-Capillary: 100 mg/dL — ABNORMAL HIGH (ref 70–99)
Glucose-Capillary: 94 mg/dL (ref 70–99)

## 2022-06-23 LAB — BASIC METABOLIC PANEL
Anion gap: 8 (ref 5–15)
BUN: 11 mg/dL (ref 6–20)
CO2: 28 mmol/L (ref 22–32)
Calcium: 8.5 mg/dL — ABNORMAL LOW (ref 8.9–10.3)
Chloride: 104 mmol/L (ref 98–111)
Creatinine, Ser: 0.61 mg/dL (ref 0.61–1.24)
GFR, Estimated: >60 mL/min (ref >60)
Glucose, Bld: 104 mg/dL — ABNORMAL HIGH (ref 70–99)
Potassium: 3.7 mmol/L (ref 3.5–5.1)
Sodium: 140 mmol/L (ref 135–145)

## 2022-06-23 LAB — CBC
HCT: 40.1 % (ref 39.0–52.0)
Hemoglobin: 12.1 g/dL — ABNORMAL LOW (ref 13.0–17.0)
MCH: 29.2 pg (ref 26.0–34.0)
MCHC: 30.2 g/dL (ref 30.0–36.0)
MCV: 96.6 fL (ref 80.0–100.0)
Platelets: 215 10*3/uL (ref 150–400)
RBC: 4.15 MIL/uL — ABNORMAL LOW (ref 4.22–5.81)
RDW: 14.3 % (ref 11.5–15.5)
WBC: 7.6 10*3/uL (ref 4.0–10.5)
nRBC: 0 % (ref 0.0–0.2)

## 2022-06-23 MED ORDER — GABAPENTIN 100 MG PO CAPS: 200.0000 mg | ORAL_CAPSULE | Freq: Two times a day (BID) | ORAL | 0 refills | Status: DC

## 2022-06-23 MED ORDER — LACTASE 3000 UNITS PO TABS: 9000.0000 [IU] | ORAL_TABLET | ORAL | Status: DC | PRN

## 2022-06-23 MED ORDER — ONDANSETRON 4 MG PO TBDP: 4.0000 mg | ORAL_TABLET | Freq: Four times a day (QID) | ORAL | 0 refills | Status: DC | PRN

## 2022-06-23 MED ORDER — PANTOPRAZOLE SODIUM 40 MG PO TBEC: 40.0000 mg | DELAYED_RELEASE_TABLET | Freq: Every day | ORAL | 0 refills | Status: DC

## 2022-06-23 MED ORDER — ENOXAPARIN SODIUM 60 MG/0.6ML IJ SOSY: 60.0000 mg | PREFILLED_SYRINGE | Freq: Two times a day (BID) | INTRAMUSCULAR | 0 refills | Status: DC

## 2022-06-23 MED ORDER — OXYCODONE-ACETAMINOPHEN 10-325 MG PO TABS: 1.0000 | ORAL_TABLET | ORAL | 0 refills | Status: DC | PRN

## 2022-06-23 NOTE — Progress Notes (Signed)
Physical Therapy Treatment Patient Details Name: Michael Payne MRN: [id number] DOB: 15-Sep-1971 Today's Date: 06/23/2022   History of Present Illness Mr. Haub is a 51 year old underwent robotic sleeve gastrectomy with upper endoscopy on 06/20/22. PHMx: morbid obesity, GERD, HTN, and sleep apnea.    PT Comments    Patient is very determined. Goal today:"Take some steps".  Patient  demonstrates improved bed mobility, requiring less support.  Patient able to ambulate x 6' x 2  using his personal bariatric RW. Patient required rest break.   Patient will benefit from AIR to return to  functional independence.  Patient left in chair position of EZ wider bed. Continue to progress ambulation.   Recommendations for follow up therapy are one component of a multi-disciplinary discharge planning process, led by the attending physician.  Recommendations may be updated based on patient status, additional functional criteria and insurance authorization.  Follow Up Recommendations  Acute inpatient rehab (3hours/day)     Assistance Recommended at Discharge Intermittent Supervision/Assistance  Patient can return home with the following A lot of help with walking and/or transfers;A little help with bathing/dressing/bathroom;Help with stairs or ramp for entrance;Assistance with cooking/housework;Assist for transportation   Equipment Recommendations  None recommended by PT    Recommendations for Other Services Rehab consult     Precautions / Restrictions Precautions Precautions: Fall Restrictions Weight Bearing Restrictions: No     Mobility  Bed Mobility Overal bed mobility: Needs Assistance Bed Mobility: Rolling, Sidelying to Sit, Sit to Supine, Sit to Sidelying Rolling: Mod assist         General bed mobility comments: min to roll to the lrft and mod toroll to the right, max use of rails. Min assist to move to sitting with HOB raised max, sitting up  onto left side of bed, max use of  bed rails maximally. Patient able to return to supine with HOB raised and use of bed rails.    Transfers Overall transfer level: Needs assistance Equipment used: Rolling walker (2 wheels) (patient has his special bariatric RW brought to room) Transfers: Sit to/from Stand Sit to Stand: +2 safety/equipment, +2 physical assistance, From elevated surface, Mod assist           General transfer comment: patient's Rw has to be placed very close to bed in order to push up, patient stood x 2 from elevated bed maximally raised. Patient ciounts and uses momentum to power up.    Ambulation/Gait Ambulation/Gait assistance: Min assist, +2 physical assistance, +2 safety/equipment Gait Distance (Feet): 6 Feet (forward and backward x 2) Assistive device: Rolling walker (2 wheels) Gait Pattern/deviations: Step-to pattern Gait velocity: decr     General Gait Details: patient goes very slow, step at a time and steps back to bed   Stairs             Wheelchair Mobility    Modified Rankin (Stroke Patients Only)       Balance Overall balance assessment: Needs assistance Sitting-balance support: Feet supported, No upper extremity supported Sitting balance-Leahy Scale: Good     Standing balance support: During functional activity, Reliant on assistive device for balance, Bilateral upper extremity supported Standing balance-Leahy Scale: Poor Standing balance comment: reliant on support at back of recliner.                            Cognition Arousal/Alertness: Awake/alert  Exercises      General Comments        Pertinent Vitals/Pain Pain Assessment Faces Pain Scale: Hurts even more Pain Location: right hip and left abdomen with mobility Pain Descriptors / Indicators: Cramping, Grimacing, Discomfort Pain Intervention(s): Monitored during session, Premedicated before session, Relaxation    Home  Living                          Prior Function            PT Goals (current goals can now be found in the care plan section) Progress towards PT goals: Progressing toward goals    Frequency    Min 3X/week      PT Plan Current plan remains appropriate    Co-evaluation PT/OT/SLP Co-Evaluation/Treatment: Yes Reason for Co-Treatment: For patient/therapist safety;To address functional/ADL transfers PT goals addressed during session: Mobility/safety with mobility;Proper use of DME;Strengthening/ROM OT goals addressed during session: ADL's and self-care      AM-PAC PT "6 Clicks" Mobility   Outcome Measure  Help needed turning from your back to your side while in a flat bed without using bedrails?: A Lot Help needed moving from lying on your back to sitting on the side of a flat bed without using bedrails?: A Lot Help needed moving to and from a bed to a chair (including a wheelchair)?: A Lot Help needed standing up from a chair using your arms (e.g., wheelchair or bedside chair)?: A Lot Help needed to walk in hospital room?: A Lot Help needed climbing 3-5 steps with a railing? : Total 6 Click Score: 11    End of Session   Activity Tolerance: Patient tolerated treatment well Patient left: in bed;with call bell/phone within reach (in bed chair position) Nurse Communication: Mobility status PT Visit Diagnosis: Unsteadiness on feet (R26.81);Difficulty in walking, not elsewhere classified (R26.2);Pain Pain - Right/Left: Right Pain - part of body: Hip     Time: 5631-4970 PT Time Calculation (min) (ACUTE ONLY): 54 min  Charges:  $Gait Training: 23-37 mins                     [place] Office [phone] Weekend [phone]    Claretha Cooper 06/23/2022, 12:48 PM

## 2022-06-23 NOTE — Telephone Encounter (Signed)
I have faxed orders back with message below written.

## 2022-06-23 NOTE — Progress Notes (Signed)
PHARMACY CONSULT FOR:  Risk Assessment for Post-Discharge VTE Following Bariatric Surgery  Post-Discharge VTE Risk Assessment: This patient's probability of 30-day post-discharge VTE is increased due to the factors marked: x Sleeve gastrectomy   Liver disorder (transplant, cirrhosis, or nonalcoholic steatohepatitis)   Hx of VTE   Hemorrhage requiring transfusion   GI perforation, leak, or obstruction   ====================================================  x  Male    Age >/=60 years  x  BMI >/=50 kg/m2    CHF    Dyspnea at Rest    Paraplegia  x  Non-gastric-band surgery    Operation Time >/=3 hr    Return to OR   x  Length of Stay >/= 3 d   Hypercoagulable condition   Significant venous stasis    Predicted probability of 30-day post-discharge VTE: 0.8% based on above checked criteria would qualify pt for enoxaparin 60 mg subQ q12h x 2 weeks  Other patient-specific factors to consider: Discussed patient specific risk factors with Dr. Dossie Der. Although patient is not paraplegic, he does have limited mobility and is non-ambulatory at baseline. Given mobility issues, will increase duration of enoxaparin for VTE prophylaxis to 4 weeks.  If patient stable for discharge, but remains inpatient pending placement, could consider going ahead and transitioning from subQ heparin to enoxaparin.  Recommendation for Discharge: Enoxaparin 60 mg [place] q12h x 4 weeks post-discharge  Michael Payne is a 51 y.o. male who underwent sleeve gastrectomy on 06/20/22   Case start: 1439 Case end: 1646  Allergies  Allergen Reactions   Lisinopril Shortness Of Breath and Other (See Comments)    Causes cough   Aspirin Other (See Comments)    Gi upset/pain   Lactose Intolerance (Gi) Diarrhea, Nausea And Vomiting and Other (See Comments)    Stomach cramps    Patient Measurements: Height: 6\' 4"  (193 cm) Weight: (!) 226.1 kg (498 lb 8 oz) IBW/kg (Calculated) : 86.8 Body mass index is 60.68  kg/m.  Recent Labs    06/21/22 0256 06/22/22 0323 06/23/22 0502  WBC 14.1* 9.8 7.6  HGB 13.1 12.2* 12.1*  HCT 42.8 40.4 40.1  PLT 250 226 215  CREATININE 0.79 0.76 0.61   Estimated Creatinine Clearance: 222.7 mL/min (by C-G formula based on SCr of 0.61 mg/dL).   Past Medical History:  Diagnosis Date   Anemia    borderline anemia, was instructed to eat more iron filled foods   Cardiac enlargement 06/03/2022   Noted on Xray   CHF (congestive heart failure) (HCC) 05/2021   GERD (gastroesophageal reflux disease)    lactose intolerance, patient denies symptoms of reflux   Hip pain    History of kidney stones    HTN (hypertension)    Morbid obesity (HCC)    Obesity    Pneumonia 05/2021   Pre-diabetes    Sleep apnea     Medications Prior to Admission  Medication Sig Dispense Refill Last Dose   acetaminophen (TYLENOL) 500 MG tablet Take 1,000 mg by mouth 3 (three) times daily as needed (pain).   06/19/2022   allopurinol (ZYLOPRIM) 100 MG tablet Take 1 tablet (100 mg total) by mouth every other day. (Patient taking differently: Take 100 mg by mouth 3 (three) times a week.) 90 tablet 1 06/17/2022   Amino Acids (AMINO ACID PO) Take 2 Scoops by mouth 2 (two) times a week.   06/16/2022   amLODipine (NORVASC) 10 MG tablet Take 1 tablet (10 mg total) by mouth daily. 90 tablet 1 06/20/2022  carvedilol (COREG) 3.125 MG tablet TAKE 1 TABLET BY MOUTH TWICE A DAY WITH A MEAL (Patient taking differently: Take 3.125 mg by mouth 2 (two) times daily with a meal.) 180 tablet 2 06/20/2022 at 0730   Dulaglutide (TRULICITY) 3 XB/9.3JQ SOPN INJECT 3 MG AS DIRECTED ONCE A WEEK (Patient taking differently: Inject 3 mg as directed once a week. Saturday) 2 mL 5 06/11/2022   ELDERBERRY PO Take 2 tablets by mouth daily.   06/18/2022   furosemide (LASIX) 40 MG tablet TAKE 1 TABLET EVERY DAY 90 tablet 1 06/19/2022   Multiple Vitamins-Minerals (MULTIVITAMIN WITH MINERALS) tablet Take 1 tablet by mouth daily.    06/19/2022   naloxone (NARCAN) nasal spray 4 mg/0.1 mL Spray nostril if needed for opioid overuse- decreased consciousness, decreased respirations (Patient taking differently: Place 0.4 mg into the nose once.) 2 each 1 never   potassium chloride (KLOR-CON M10) 10 MEQ tablet Take 1 tablet (10 mEq total) by mouth daily. 90 tablet 1 06/19/2022   vitamin C (ASCORBIC ACID) 500 MG tablet Take 500 mg by mouth daily.   06/19/2022   [DISCONTINUED] oxyCODONE-acetaminophen (PERCOCET) 10-325 MG tablet Take 1 tablet by mouth every 6 (six) hours as needed for pain. (Patient taking differently: Take 1 tablet by mouth every 4 (four) hours as needed for pain.) 120 tablet 0 06/19/2022   celecoxib (CELEBREX) 100 MG capsule Take 1 capsule (100 mg total) by mouth 2 (two) times daily. (Patient not taking: Reported on 06/21/2022) 60 capsule 1 Not Taking   Colchicine (MITIGARE) 0.6 MG CAPS Take 0.5 tablets by mouth daily as needed (for gout.  okay to fill with mitigare). (Patient not taking: Reported on 06/21/2022) 30 capsule 3 Not Taking    Lenis Noon, PharmD 06/23/2022,9:13 AM

## 2022-06-23 NOTE — Progress Notes (Signed)
Progress Note: General Surgery Service   Chief Complaint/Subjective: Doing well POD3. Pain controlled with pain medication by mouth.  Tolerating protein if taking home lactase pills.  Has walker from home to walk with PT today.  Excited by idea of going to inpatient rehab for 3 hours of therapy per day.  He dreams of walking out of .  Objective: Vital signs in last 24 hours: Temp:  [98.1 F (36.7 C)-98.7 F (37.1 C)] 98.7 F (37.1 C) (10/05 0534) Pulse Rate:  [75-85] 78 (10/05 0534) Resp:  [15-22] 19 (10/05 0534) BP: (134-151)/(80-92) 134/81 (10/05 0534) SpO2:  [94 %-100 %] 99 % (10/05 0534) Last BM Date :  (PTA)  Intake/Output from previous day: 10/04 0701 - 10/05 0700 In: 2400.1 [P.O.:900; I.V.:1500.1] Out: 3200 [Urine:3200] Intake/Output this shift: No intake/output data recorded.  GI: Abd incisions c/d/I w/ glue  Lab Results: CBC  Recent Labs    06/22/22 0323 06/23/22 0502  WBC 9.8 7.6  HGB 12.2* 12.1*  HCT 40.4 40.1  PLT 226 215    BMET Recent Labs    06/22/22 0323 06/23/22 0502  NA 138 140  K 4.2 3.7  CL 102 104  CO2 29 28  GLUCOSE 118* 104*  BUN 11 11  CREATININE 0.76 0.61  CALCIUM 8.4* 8.5*    PT/INR No results for input(s): "LABPROT", "INR" in the last 72 hours. ABG No results for input(s): "PHART", "HCO3" in the last 72 hours.  Invalid input(s): "PCO2", "PO2"  Anti-infectives: Anti-infectives (From admission, onward)    Start     Dose/Rate Route Frequency Ordered Stop   06/20/22 1300  cefoTEtan (CEFOTAN) 2 g in sodium chloride 0.9 % 100 mL IVPB        2 g 200 mL/hr over 30 Minutes Intravenous On call to O.R. 06/20/22 1249 06/20/22 1515       Medications: Scheduled Meds:  acetaminophen  1,000 mg Oral Q8H   Or   acetaminophen (TYLENOL) oral liquid 160 mg/5 mL  1,000 mg Oral Q8H   allopurinol  100 mg Oral QODAY   amLODipine  10 mg Oral Daily   carvedilol  3.125 mg Oral BID WC   Chlorhexidine Gluconate Cloth  6 each  Topical Daily   docusate sodium  100 mg Oral BID   heparin injection (subcutaneous)  5,000 Units Subcutaneous Q8H   insulin aspart  0-20 Units Subcutaneous TID WC   insulin aspart  0-5 Units Subcutaneous QHS   mupirocin ointment  1 Application Nasal BID   pantoprazole (PROTONIX) IV  40 mg Intravenous QHS   Ensure Max Protein  2 oz Oral Q2H   Continuous Infusions:   PRN Meds:.hydrALAZINE, lactase, ondansetron (ZOFRAN) IV, mouth rinse, oxyCODONE, simethicone  Assessment/Plan: Mr. Kabir is a 51 year old nonambulatory male with morbid obesity, GERD, HTN, and sleep apnea.  He underwent robotic sleeve gastrectomy with upper endoscopy on 06/20/22.    Doing okay POD3 Continue bariatric protocol PT/OT - nonambulatory at baseline Case management assistance with safe discharge plan Hold lasix for now, dehydration is a common issue after sleeve gastrectomy, but will need to watch for signs of heart failure - monitor urine output  Recommend patient takes lasix only if needed postoperatively - he is familiar with signs of edema and fluid retention Hopeful will be accepted to inpatient rehab, patient excited about this idea and I think this would be the best next step in his recovery.    Patient medically stable for discharge.   LOS:  3 days    Felicie Morn, [facility] Use [email or web address] to contact on call provider  Daily Billing: [phone] - post op

## 2022-06-23 NOTE — Progress Notes (Signed)
Inpatient Rehabilitation Admissions Coordinator   Discussed case with Dr Naaman Plummer, Medical Director for CIR. Once patient is able to take a few steps with PT and OT, we can then pursue insurance Auth with [facility] Medicare for a possible admit. I have alerted acute team and TOC of plan. I will follow his progress.  Danne Baxter, RN, MSN Rehab Admissions Coordinator [phone] 06/23/2022 11:05 AM

## 2022-06-23 NOTE — Progress Notes (Signed)
Occupational Therapy Treatment Patient Details Name: Michael Payne MRN: [id number] DOB: March 11, 1971 Today's Date: 06/23/2022   History of present illness Mr. Loiseau is a 51 year old underwent robotic sleeve gastrectomy with upper endoscopy on 06/20/22. PHMx: morbid obesity, GERD, HTN, and sleep apnea.   OT comments  Patient was very motivated to participate in therapy, and he set a personal goal to be able to ambulate today. He presented with very good insight into his functional abilities, and was able to actively express his needs and desires throughout the session. He required decreased assistance for bed mobility, though requiring an elevated HOB & manipulation of bed controls to assist in this regard. His sitting balance EOB was good. He progressed to performing two stands, requiring use a heavy duty, bariatric RW & assistance of others to stabilize the walker as he performed transfers. He ambulated a few feet twice using the RW, though needing a seated rest break between each rep. He reported having chronic R hip pain, and was also noted to be with deconditioning, endurance limitations, and shortness of breath with progressive activity, hence his need for further therapy services. He will continue to benefit from further OT services to maximize his safety and independence with self-care tasks & to decrease the risk for restricted participation in meaningful activities.    Recommendations for follow up therapy are one component of a multi-disciplinary discharge planning process, led by the attending physician.  Recommendations may be updated based on patient status, additional functional criteria and insurance authorization.    Follow Up Recommendations  Acute inpatient rehab (3hours/day)    Assistance Recommended at Discharge Frequent or constant Supervision/Assistance  Patient can return home with the following  Assistance with cooking/housework;Help with stairs or ramp for entrance;Assist  for transportation;A lot of help with bathing/dressing/bathroom      Recommendations for Other Services Rehab consult    Precautions / Restrictions Precautions Precautions: Fall Restrictions Weight Bearing Restrictions: No       Mobility Bed Mobility Overal bed mobility: Needs Assistance Bed Mobility: Supine to Sit, Sit to Supine     Supine to sit: Min guard, HOB elevated, increased time Sit to supine: Min assist        Transfers Overall transfer level: Needs assistance Equipment used: Rolling walker (2 wheels) Transfers: Sit to/from Stand Sit to Stand: +2 safety/equipment, From elevated surface, Mod assist; 2 total stands performed           General transfer comment: During initial sit to stand, pt required mod assist x2. After ambulating a short distance and a seated rest break, he required slightly decreased x2 person assist to stand for the second stand. Required use of bariatric heavy duty walker for transfers, required 2 person assist to stabilize the RW as he stood, given his reliance on pushing through his B UE in order to stand. Bed height elevated     Balance     Sitting balance-Leahy Scale: Good         Standing balance comment: Min guard assist for static standing. Min assist x2 for dynamic standing using RW         ADL either performed or assessed with clinical judgement      Eating/Feeding: Independent Eating/Feeding Details (indicate cue type and reason): seated EOB or chair level, based on clinical judgement Grooming: Set up;Sitting Grooming Details (indicate cue type and reason): EOB         Upper Body Dressing : Minimal assistance   Lower  Body Dressing: Maximal assistance;Sit to/from stand                     Cognition   Behavior During Therapy: [facility] for tasks assessed/performed Overall Cognitive Status: Within Functional Limits for tasks assessed            General Comments: friendly, very motivated to participate in  the session, oriented x4, able to follow commands without difficulty                   Pertinent Vitals/ Pain       Pain Assessment Pain Assessment: 0-10 Pain Score: 5  Pain Location: right hip (chronic pain) Pain Intervention(s): Repositioned, Limited activity within patient's tolerance         Frequency  Min 2X/week        Progress Toward Goals  OT Goals(current goals can now be found in the care plan section)  Progress towards OT goals: Progressing toward goals  Acute Rehab OT Goals Patient Stated Goal: His goal for the day was to be able to ambulate. He also desires to go to acute rehab once discharged from the hospital. OT Goal Formulation: With patient Time For Goal Achievement: 07/05/22 Potential to Achieve Goals: Good  Plan Discharge plan remains appropriate    Co-evaluation    PT/OT/SLP Co-Evaluation/Treatment: Yes Reason for Co-Treatment: For patient/therapist safety;To address functional/ADL transfers PT goals addressed during session: Mobility/safety with mobility;Proper use of DME;Strengthening/ROM OT goals addressed during session: ADL's and self-care      AM-PAC OT "6 Clicks" Daily Activity     Outcome Measure   Help from another person eating meals?: None Help from another person taking care of personal grooming?: A Little Help from another person toileting, which includes using toliet, bedpan, or urinal?: A Lot Help from another person bathing (including washing, rinsing, drying)?: A Lot Help from another person to put on and taking off regular upper body clothing?: A Little Help from another person to put on and taking off regular lower body clothing?: A Lot 6 Click Score: 16    End of Session Equipment Utilized During Treatment: Rolling walker (2 wheels)  OT Visit Diagnosis: Pain;Muscle weakness (generalized) (M62.81)   Activity Tolerance Patient tolerated treatment well   Patient Left in bed;with call bell/phone within reach    Nurse Communication Mobility status        Time: 5102-5852 OT Time Calculation (min): 54 min  Charges: OT General Charges $OT Visit: 1 Visit OT Treatments $Self Care/Home Management : 23-37 mins    Leota Sauers, OTR/L 06/23/2022, 12:54 PM

## 2022-06-23 NOTE — Progress Notes (Signed)
Patient alert and oriented, Post op day 3.  Provided support and encouragement.  Encouraged pulmonary toilet, working with PT/OT and small sips of liquids.  Reviewed Gastric sleeve discharge instructions with patient and patient is able to articulate understanding.  Provided information on BELT program, Support Group and [facility] outpatient pharmacy. All questions answered, will continue to monitor.

## 2022-06-23 NOTE — Care Management Important Message (Signed)
Important Message  Patient Details IM Letter given Name: Michael Payne MRN: [id number] Date of Birth: December 29, 1970   Medicare Important Message Given:  Yes     Kerin Salen 06/23/2022, 3:06 PM

## 2022-06-23 NOTE — Progress Notes (Addendum)
Inpatient Rehabilitation Admissions Coordinator   I have begun insurance Auth with [facility] Medicare for possible Cir admit pending insurance approval.  Danne Baxter, RN, MSN Rehab Admissions Coordinator [phone] 06/23/2022 1:22 PM

## 2022-06-24 LAB — GLUCOSE, CAPILLARY
Glucose-Capillary: 100 mg/dL — ABNORMAL HIGH (ref 70–99)
Glucose-Capillary: 100 mg/dL — ABNORMAL HIGH (ref 70–99)
Glucose-Capillary: 97 mg/dL (ref 70–99)
Glucose-Capillary: 98 mg/dL (ref 70–99)

## 2022-06-24 MED ORDER — ENOXAPARIN (LOVENOX) PATIENT EDUCATION KIT
PACK | Freq: Once | Status: AC
  Filled 2022-06-24: qty 1

## 2022-06-24 NOTE — Progress Notes (Signed)
Progress Note: General Surgery Service   Chief Complaint/Subjective: Doing well POD4. Pain controlled with pain medication by mouth.  Tolerating protein if taking home lactase pills.  Has walker from home to walk with PT today.  Excited by idea of going to inpatient rehab for 3 hours of therapy per day.  Had BMs yesterday. Reports he got down 2.5 shakes yesterday. Pt on phone with his insurance Objective: Vital signs in last 24 hours: Temp:  [97.8 F (36.6 C)-98.7 F (37.1 C)] 97.8 F (36.6 C) (10/06 0852) Pulse Rate:  [72-86] 86 (10/06 0852) Resp:  [17-18] 18 (10/06 0852) BP: (129-149)/(76-89) 132/81 (10/06 0852) SpO2:  [96 %-98 %] 97 % (10/06 0852) Last BM Date : 06/23/22  Intake/Output from previous day: 10/05 0701 - 10/06 0700 In: 720 [P.O.:720] Out: 2150 [Urine:2150] Intake/Output this shift: Total I/O In: 60 [P.O.:60] Out: 200 [Urine:200]  Alert, nontoxic Resting comfortably in bed nonlabored GI: Abd incisions c/d/I w/ glue, soft, mild expected TTP  Lab Results: CBC  Recent Labs    06/22/22 0323 06/23/22 0502  WBC 9.8 7.6  HGB 12.2* 12.1*  HCT 40.4 40.1  PLT 226 215    BMET Recent Labs    06/22/22 0323 06/23/22 0502  NA 138 140  K 4.2 3.7  CL 102 104  CO2 29 28  GLUCOSE 118* 104*  BUN 11 11  CREATININE 0.76 0.61  CALCIUM 8.4* 8.5*    PT/INR No results for input(s): "LABPROT", "INR" in the last 72 hours. ABG No results for input(s): "PHART", "HCO3" in the last 72 hours.  Invalid input(s): "PCO2", "PO2"  Anti-infectives: Anti-infectives (From admission, onward)    Start     Dose/Rate Route Frequency Ordered Stop   06/20/22 1300  cefoTEtan (CEFOTAN) 2 g in sodium chloride 0.9 % 100 mL IVPB        2 g 200 mL/hr over 30 Minutes Intravenous On call to O.R. 06/20/22 1249 06/20/22 1515       Medications: Scheduled Meds:  acetaminophen  1,000 mg Oral Q8H   Or   acetaminophen (TYLENOL) oral liquid 160 mg/5 mL  1,000 mg Oral Q8H   allopurinol   100 mg Oral QODAY   amLODipine  10 mg Oral Daily   carvedilol  3.125 mg Oral BID WC   Chlorhexidine Gluconate Cloth  6 each Topical Daily   docusate sodium  100 mg Oral BID   enoxaparin   Does not apply Once   heparin injection (subcutaneous)  5,000 Units Subcutaneous Q8H   insulin aspart  0-20 Units Subcutaneous TID WC   insulin aspart  0-5 Units Subcutaneous QHS   mupirocin ointment  1 Application Nasal BID   pantoprazole (PROTONIX) IV  40 mg Intravenous QHS   Ensure Max Protein  2 oz Oral Q2H   Continuous Infusions:   PRN Meds:.hydrALAZINE, lactase, ondansetron (ZOFRAN) IV, mouth rinse, oxyCODONE, simethicone  Assessment/Plan: Mr. Monnier is a 51 year old nonambulatory male with morbid obesity, GERD, HTN, and sleep apnea.  He underwent robotic sleeve gastrectomy with upper endoscopy on 06/20/22 by Dr Dossie Der   Doing okay POD4 Continue bariatric protocol PT/OT - nonambulatory at baseline Case management assistance with safe discharge plan Hold lasix for now, dehydration is a common issue after sleeve gastrectomy, but will need to watch for signs of heart failure - monitor urine output  Recommend patient takes lasix only if needed postoperatively - he is familiar with signs of edema and fluid retention Hopeful will be accepted to  inpatient rehab, patient excited about this idea and I think this would be the best next step in his recovery.   VTE prophylaxis - scds, subcu heparin;  on discharge pt will transition to lovenox 60mg  subcu q12hrs for a total of 30 days from date of surgery  Patient medically stable for discharge.   LOS: 4 days    Greer Pickerel, MD  [facility] Use [email or web address] to contact on call provider  Daily Billing: [phone] - post op

## 2022-06-24 NOTE — Progress Notes (Signed)
Physical Therapy Treatment Patient Details Name: Michael Payne MRN: [id number] DOB: Jul 05, 1971 Today's Date: 06/24/2022   History of Present Illness Michael Payne is a 51 year old underwent robotic sleeve gastrectomy with upper endoscopy on 06/20/22. PHMx: morbid obesity, GERD, HTN, and sleep apnea.    PT Comments    The patient reports having  eventful night, feels tired but ready to walk today.  Patient min guard for moving supine to sit and back to supine, patient maximally using rails and adjusting HOB . Patient stands from elevated bed with min assistance at patient's RW.  Patient ambulated x 16' x 2 with seated rest break..  Patient continues to demonstrate improved mobility and self care, asking  for  his wipes and able to perform pericare when in sidelying.  Recommend  AIR, patient very motivated. HR 119 , SPO2 945 on RA post ambulation.  Recommendations for follow up therapy are one component of a multi-disciplinary discharge planning process, led by the attending physician.  Recommendations may be updated based on patient status, additional functional criteria and insurance authorization.  Follow Up Recommendations  Acute inpatient rehab (3hours/day)     Assistance Recommended at Discharge Intermittent Supervision/Assistance  Patient can return home with the following A lot of help with walking and/or transfers;A little help with bathing/dressing/bathroom;Help with stairs or ramp for entrance;Assistance with cooking/housework;Assist for transportation   Equipment Recommendations  None recommended by PT    Recommendations for Other Services Rehab consult     Precautions / Restrictions Precautions Precautions: Fall     Mobility  Bed Mobility   Bed Mobility: Rolling, Sidelying to Sit, Sit to Supine Rolling: Min assist Sidelying to sit: Min guard   Sit to supine: Min guard   General bed mobility comments: min to roll to the right, and no assist to left with bed  rail use. Min guard to move to sitting with HOB raised max, patient self adjusting HOB and height of bed, sitting up  onto left side of bed, Patient able to return to supine with HOB raised and use of bed rails.    Transfers Overall transfer level: Needs assistance Equipment used: Rolling walker (2 wheels)               General transfer comment: patient's Rw has to be placed very close to bed in order to push up, patient stood x 2 from elevated bed maximally raised. Patient counts and uses momentum to power up.    Ambulation/Gait Ambulation/Gait assistance: Min assist, +2 physical assistance, +2 safety/equipment Gait Distance (Feet): 15 Feet Assistive device: Rolling walker (2 wheels) Gait Pattern/deviations: Step-to pattern Gait velocity: decr     General Gait Details: patient goes very slow, step at a time , able to turn RW to ambulate in the room   Stairs             Wheelchair Mobility    Modified Rankin (Stroke Patients Only)       Balance   Sitting-balance support: Feet supported, No upper extremity supported Sitting balance-Leahy Scale: Good     Standing balance support: During functional activity, Reliant on assistive device for balance, Bilateral upper extremity supported Standing balance-Leahy Scale: Poor Standing balance comment: reliant on support of RW                            Cognition Arousal/Alertness: Awake/alert  Exercises      General Comments        Pertinent Vitals/Pain Pain Assessment Faces Pain Scale: Hurts a little bit Pain Location: abdomen Pain Descriptors / Indicators: Discomfort Pain Intervention(s): Monitored during session, Premedicated before session, Relaxation    Home Living                          Prior Function            PT Goals (current goals can now be found in the care plan section) Progress towards PT goals:  Progressing toward goals    Frequency    Min 3X/week      PT Plan Current plan remains appropriate    Co-evaluation              AM-PAC PT "6 Clicks" Mobility   Outcome Measure  Help needed turning from your back to your side while in a flat bed without using bedrails?: A Little Help needed moving from lying on your back to sitting on the side of a flat bed without using bedrails?: A Little Help needed moving to and from a bed to a chair (including a wheelchair)?: A Little Help needed standing up from a chair using your arms (e.g., wheelchair or bedside chair)?: A Lot Help needed to walk in hospital room?: A Lot Help needed climbing 3-5 steps with a railing? : Total 6 Click Score: 14    End of Session Equipment Utilized During Treatment: Gait belt Activity Tolerance: Patient tolerated treatment well Patient left: in bed;with call bell/phone within reach;with nursing/sitter in room Nurse Communication: Mobility status PT Visit Diagnosis: Unsteadiness on feet (R26.81);Difficulty in walking, not elsewhere classified (R26.2);Pain Pain - Right/Left: Right Pain - part of body: Hip     Time: 9518-8416 PT Time Calculation (min) (ACUTE ONLY): 54 min  Charges:  $Gait Training: 23-37 mins $Therapeutic Activity: 23-37 mins                     [place] Office [phone] Weekend [phone]    Claretha Cooper 06/24/2022, 12:47 PM

## 2022-06-24 NOTE — Progress Notes (Signed)
Inpatient Rehabilitation Admissions Coordinator   I await insurance approval and bed availability for possible admit to CIR.  Danne Baxter, RN, MSN Rehab Admissions Coordinator [phone] 06/24/2022 11:10 AM

## 2022-06-24 NOTE — TOC Progression Note (Signed)
Transition of Care [facility]) - Progression Note    Patient Details  Name: Michael Payne MRN: [id number] Date of Birth: 1971-07-17  Transition of Care [facility]) CM/SW Contact  Lennart Pall, LCSW Phone Number: 06/24/2022, 1:17 PM  Clinical Narrative:     Touching base with pt today and he reports he is awaiting decision from insurance on CIR admission.  He is very hopeful and notes he has spoken with insurance himself to add to "argument".  Pt does report that, if CIR is denied, he does NOT plan to dc to SNF rehab.  He would plan to dc home with [facility] resumed with [facility].  TOC will continue to follow along.  Expected Discharge Plan: [place] Barriers to Discharge: Continued Medical Work up  Expected Discharge Plan and Services Expected Discharge Plan: [place] In-house Referral: NA Discharge Planning Services: CM Consult Post Acute Care Choice: IP Rehab Living arrangements for the past 2 months: Single Family Home                 DME Arranged: N/A DME Agency: NA                   Social Determinants of Health (SDOH) Interventions    Readmission Risk Interventions    06/22/2022    1:29 PM 06/28/2021    9:01 AM 06/23/2021   10:53 AM  Readmission Risk Prevention Plan  Post Dischage Appt Complete Complete Complete  Medication Screening Complete Complete Complete  Transportation Screening Complete Complete Complete

## 2022-06-25 LAB — GLUCOSE, CAPILLARY
Glucose-Capillary: 100 mg/dL — ABNORMAL HIGH (ref 70–99)
Glucose-Capillary: 101 mg/dL — ABNORMAL HIGH (ref 70–99)
Glucose-Capillary: 93 mg/dL (ref 70–99)
Glucose-Capillary: 99 mg/dL (ref 70–99)

## 2022-06-25 NOTE — Progress Notes (Signed)
    Assessment & Plan: Patient is a 51 year old nonambulatory male with morbid obesity, GERD, HTN, and sleep apnea.   POD#5 - status post robotic sleeve gastrectomy with upper endoscopy - 06/20/22 Dr Thermon Leyland Continue bariatric protocol PT/OT - nonambulatory at baseline Case management assistance with safe discharge plan Hopeful will be accepted to inpatient rehab VTE prophylaxis - scds, subcu heparin;  on discharge pt will transition to lovenox 60mg  subcu q12hrs for a total of 30 days from date of surgery   Patient medically stable for discharge.        Michael Gemma, MD [facility] [place] practice Office: [phone]        Chief Complaint: Morbid obesity  Subjective: Patient in bed, comfortable.  Taking shakes, protein supplements, water.  Objective: Vital signs in last 24 hours: Temp:  [97.5 F (36.4 C)-98.6 F (37 C)] 98.6 F (37 C) (10/07 0530) Pulse Rate:  [75-86] 75 (10/07 0530) Resp:  [18] 18 (10/07 0530) BP: (121-146)/(72-92) 136/82 (10/07 0530) SpO2:  [97 %-100 %] 100 % (10/07 0530) Weight:  [223.4 kg] 223.4 kg (10/06 1222) Last BM Date : 06/24/22  Intake/Output from previous day: 10/06 0701 - 10/07 0700 In: 660 [P.O.:660] Out: 825 [Urine:825] Intake/Output this shift: Total I/O In: -  Out: 450 [Urine:450]  Physical Exam: HEENT - sclerae clear, mucous membranes moist Abdomen - soft, obese; wounds dry and intact Neuro - alert & oriented, no focal deficits  Lab Results:  Recent Labs    06/23/22 0502  WBC 7.6  HGB 12.1*  HCT 40.1  PLT 215   BMET Recent Labs    06/23/22 0502  NA 140  K 3.7  CL 104  CO2 28  GLUCOSE 104*  BUN 11  CREATININE 0.61  CALCIUM 8.5*   PT/INR No results for input(s): "LABPROT", "INR" in the last 72 hours. Comprehensive Metabolic Panel:    Component Value Date/Time   NA 140 06/23/2022 0502   NA 138 06/22/2022 0323   NA 140 07/26/2021 0948   K 3.7 06/23/2022 0502   K 4.2 06/22/2022 0323    CL 104 06/23/2022 0502   CL 102 06/22/2022 0323   CO2 28 06/23/2022 0502   CO2 29 06/22/2022 0323   BUN 11 06/23/2022 0502   BUN 11 06/22/2022 0323   BUN 12 07/26/2021 0948   CREATININE 0.61 06/23/2022 0502   CREATININE 0.76 06/22/2022 0323   CREATININE 0.79 05/27/2015 1246   CREATININE 0.98 10/08/2014 1618   GLUCOSE 104 (H) 06/23/2022 0502   GLUCOSE 118 (H) 06/22/2022 0323   CALCIUM 8.5 (L) 06/23/2022 0502   CALCIUM 8.4 (L) 06/22/2022 0323   AST 20 06/03/2022 1109   AST 86 (H) 06/23/2021 0226   ALT 27 06/03/2022 1109   ALT 165 (H) 06/23/2021 0226   ALKPHOS 61 06/03/2022 1109   ALKPHOS 44 06/23/2021 0226   BILITOT 0.6 06/03/2022 1109   BILITOT 0.5 06/23/2021 0226   PROT 8.0 06/03/2022 1109   PROT 6.9 06/23/2021 0226   ALBUMIN 3.8 06/03/2022 1109   ALBUMIN 3.2 (L) 06/23/2021 0226    Studies/Results: No results found.    Michael Payne 06/25/2022   Patient ID: Michael Payne, male   DOB: 1971/07/30, 51 y.o.   MRN: [id number]

## 2022-06-25 NOTE — Progress Notes (Signed)
Inpatient Rehab Admissions Coordinator:  Informed pt that received a denial from insurance company. Pt would like to appeal decision. Will continue to follow.   Gayland Curry, [place], [place] Admissions Coordinator [phone]

## 2022-06-26 LAB — GLUCOSE, CAPILLARY
Glucose-Capillary: 98 mg/dL (ref 70–99)
Glucose-Capillary: 112 mg/dL — ABNORMAL HIGH (ref 70–99)

## 2022-06-26 NOTE — Progress Notes (Signed)
    Assessment & Plan: Patient is a 51 year old nonambulatory male with morbid obesity, GERD, HTN, and sleep apnea.   POD#6 - status post robotic sleeve gastrectomy with upper endoscopy - 06/20/22 Dr Thermon Leyland Continue bariatric protocol PT/OT - nonambulatory at baseline Case management assistance with safe discharge plan Hopeful will be accepted to inpatient rehab - [employer] denied; patient appealing VTE prophylaxis - scds, subcu heparin;  on discharge pt will transition to lovenox 60mg  subcu q12hrs for a total of 30 days from date of surgery   Patient medically stable for discharge.        Armandina Gemma, MD [facility] [place] practice Office: [phone]        Chief Complaint: Morbid obesity  Subjective: Patient in bed, pleasant, no complaints.  Taking liquids.  Objective: Vital signs in last 24 hours: Temp:  [97.9 F (36.6 C)-98.4 F (36.9 C)] 98 F (36.7 C) (10/08 0843) Pulse Rate:  [76-88] 82 (10/08 0843) Resp:  [16-18] 16 (10/08 0843) BP: (108-158)/(71-99) 158/99 (10/08 0843) SpO2:  [95 %-99 %] 97 % (10/08 0843) Last BM Date : 06/25/22  Intake/Output from previous day: 10/07 0701 - 10/08 0700 In: 180 [P.O.:180] Out: 1550 [Urine:1550] Intake/Output this shift: No intake/output data recorded.  Physical Exam: deferred  Lab Results:  No results for input(s): "WBC", "HGB", "HCT", "PLT" in the last 72 hours. BMET No results for input(s): "NA", "K", "CL", "CO2", "GLUCOSE", "BUN", "CREATININE", "CALCIUM" in the last 72 hours. PT/INR No results for input(s): "LABPROT", "INR" in the last 72 hours. Comprehensive Metabolic Panel:    Component Value Date/Time   NA 140 06/23/2022 0502   NA 138 06/22/2022 0323   NA 140 07/26/2021 0948   K 3.7 06/23/2022 0502   K 4.2 06/22/2022 0323   CL 104 06/23/2022 0502   CL 102 06/22/2022 0323   CO2 28 06/23/2022 0502   CO2 29 06/22/2022 0323   BUN 11 06/23/2022 0502   BUN 11 06/22/2022 0323   BUN 12  07/26/2021 0948   CREATININE 0.61 06/23/2022 0502   CREATININE 0.76 06/22/2022 0323   CREATININE 0.79 05/27/2015 1246   CREATININE 0.98 10/08/2014 1618   GLUCOSE 104 (H) 06/23/2022 0502   GLUCOSE 118 (H) 06/22/2022 0323   CALCIUM 8.5 (L) 06/23/2022 0502   CALCIUM 8.4 (L) 06/22/2022 0323   AST 20 06/03/2022 1109   AST 86 (H) 06/23/2021 0226   ALT 27 06/03/2022 1109   ALT 165 (H) 06/23/2021 0226   ALKPHOS 61 06/03/2022 1109   ALKPHOS 44 06/23/2021 0226   BILITOT 0.6 06/03/2022 1109   BILITOT 0.5 06/23/2021 0226   PROT 8.0 06/03/2022 1109   PROT 6.9 06/23/2021 0226   ALBUMIN 3.8 06/03/2022 1109   ALBUMIN 3.2 (L) 06/23/2021 0226    Studies/Results: No results found.    Armandina Gemma 06/26/2022   Patient ID: [name], male   DOB: 05-Jan-1971, 51 y.o.   MRN: [id number]

## 2022-06-26 NOTE — Progress Notes (Signed)
Inpatient Rehab Admissions Coordinator:  Expedited appeal started. Will continue to follow.    Gayland Curry, [place], [place] Admissions Coordinator [phone]

## 2022-06-26 NOTE — Progress Notes (Addendum)
Patient stated he would place himself on CPAP whenever he was ready and didn't need any assistance. Informed patient if he changed his mind to call the RN to call us.

## 2022-06-27 LAB — GLUCOSE, CAPILLARY
Glucose-Capillary: 102 mg/dL — ABNORMAL HIGH (ref 70–99)
Glucose-Capillary: 95 mg/dL (ref 70–99)
Glucose-Capillary: 109 mg/dL — ABNORMAL HIGH (ref 70–99)
Glucose-Capillary: 99 mg/dL (ref 70–99)

## 2022-06-27 NOTE — Care Management Important Message (Signed)
Important Message  Patient Details IM Letter given Name: Michael Payne MRN: [id number] Date of Birth: [birth year]   Medicare Important Message Given:  Yes     Kerin Salen 06/27/2022, 2:25 PM

## 2022-06-27 NOTE — Progress Notes (Signed)
Inpatient Rehab Admissions Coordinator:    I do not have a decision on expedited appeal for CIR. I will continue to follow for potential admit pending insurance auth.   Clemens Catholic, [place], [place] Admissions Coordinator  [phone] ([place]) [phone] (office)

## 2022-06-27 NOTE — Progress Notes (Signed)
Occupational Therapy Treatment Patient Details Name: Michael Payne MRN: [id number] DOB: Jul 16, 1971 Today's Date: 06/27/2022   History of present illness Michael Payne is a 51 year old underwent robotic sleeve gastrectomy with upper endoscopy on 06/20/22. PHMx: morbid obesity, GERD, HTN, and sleep apnea.   OT comments  Patient is a 51 year old male who was admitted for above. Patient is pleasant and motivated to participate in session. Patient was knowledgeable about adaptive equipment in room during session. Patient was noted to need less physical assistance than previous session on this date. Patient continues to return to bed to complete hygiene v.s. attempting to stand at this time. Patient was noted to have reports of muscle spasms in RLE limiting session. Patient would continue to benefit from skilled OT services at this time while admitted and after d/c to address noted deficits in order to improve overall safety and independence in ADLs.     Recommendations for follow up therapy are one component of a multi-disciplinary discharge planning process, led by the attending physician.  Recommendations may be updated based on patient status, additional functional criteria and insurance authorization.    Follow Up Recommendations  Acute inpatient rehab (3hours/day)    Assistance Recommended at Discharge Frequent or constant Supervision/Assistance  Patient can return home with the following  Assistance with cooking/housework;Help with stairs or ramp for entrance;Assist for transportation;A lot of help with bathing/dressing/bathroom   Equipment Recommendations  Other (comment) (defer to next venue)    Recommendations for Other Services Rehab consult    Precautions / Restrictions Precautions Precautions: Fall Restrictions Weight Bearing Restrictions: No       Mobility Bed Mobility Overal bed mobility: Needs Assistance   Rolling: Min guard Sidelying to sit: Min guard   Sit to supine:  Min guard   General bed mobility comments: with use of bed rails.    Transfers                         Balance           Standing balance support: Reliant on assistive device for balance, Bilateral upper extremity supported Standing balance-Leahy Scale: Poor Standing balance comment: reliant on support of RW                           ADL either performed or assessed with clinical judgement   ADL Overall ADL's : Needs assistance/impaired     Grooming: Set up;Sitting;Wash/dry face;Wash/dry Programmer, applications Details (indicate cue type and reason): EOB                     Toileting- Clothing Manipulation and Hygiene: Min guard;Bed level Toileting - Clothing Manipulation Details (indicate cue type and reason): rolling. pt declined to attempt in standing returning to bed to complete task     Functional mobility during ADLs: Minimal assistance;+2 for physical assistance;+2 for safety/equipment General ADL Comments: personal walker used.    Extremity/Trunk Assessment              Vision       Perception     Praxis      Cognition Arousal/Alertness: Awake/alert Behavior During Therapy: WFL for tasks assessed/performed Overall Cognitive Status: Within Functional Limits for tasks assessed  General Comments: patient is motivated to participate in session        Exercises      Shoulder Instructions       General Comments      Pertinent Vitals/ Pain       Pain Assessment Pain Assessment: Faces Faces Pain Scale: Hurts a little bit Pain Location: abdomen Pain Descriptors / Indicators: Discomfort Pain Intervention(s): Monitored during session, Premedicated before session  Home Living                                          Prior Functioning/Environment              Frequency  Min 2X/week        Progress Toward Goals  OT Goals(current goals can now be  found in the care plan section)  Progress towards OT goals: Progressing toward goals     Plan Discharge plan remains appropriate    Co-evaluation      Reason for Co-Treatment: For patient/therapist safety;To address functional/ADL transfers PT goals addressed during session: Mobility/safety with mobility OT goals addressed during session: ADL's and self-care      AM-PAC OT "6 Clicks" Daily Activity     Outcome Measure   Help from another person eating meals?: None Help from another person taking care of personal grooming?: A Little Help from another person toileting, which includes using toliet, bedpan, or urinal?: A Lot Help from another person bathing (including washing, rinsing, drying)?: A Lot Help from another person to put on and taking off regular upper body clothing?: A Little Help from another person to put on and taking off regular lower body clothing?: A Lot 6 Click Score: 16    End of Session    OT Visit Diagnosis: Pain;Muscle weakness (generalized) (M62.81)   Activity Tolerance Patient tolerated treatment well   Patient Left in bed;with call bell/phone within reach   Nurse Communication Mobility status        Time: 1015-1050 OT Time Calculation (min): 35 min  Charges: OT General Charges $OT Visit: 1 Visit OT Treatments $Therapeutic Activity: 8-22 mins  Rennie Plowman, MS Acute Rehabilitation Department Office# [phone]   Marcellina Millin 06/27/2022, 12:44 PM

## 2022-06-27 NOTE — Progress Notes (Signed)
Physical Therapy Treatment Patient Details Name: Michael Payne MRN: [id number] DOB: Jul 26, 1971 Today's Date: 06/27/2022   History of Present Illness Michael Payne is a 51 year old underwent robotic sleeve gastrectomy with upper endoscopy on 06/20/22. PHMx: morbid obesity, GERD, HTN, and sleep apnea.    PT Comments    Patient ambulated 18' x 1, right leg with spasms and felt unable to ambulate again so performed side stepping along bed x 5. Continue PT for  progressive ambulation.  Recommendations for follow up therapy are one component of a multi-disciplinary discharge planning process, led by the attending physician.  Recommendations may be updated based on patient status, additional functional criteria and insurance authorization.  Follow Up Recommendations  Acute inpatient rehab (3hours/day)     Assistance Recommended at Discharge Intermittent Supervision/Assistance  Patient can return home with the following A lot of help with walking and/or transfers;A little help with bathing/dressing/bathroom;Help with stairs or ramp for entrance;Assistance with cooking/housework;Assist for transportation   Equipment Recommendations  None recommended by PT    Recommendations for Other Services       Precautions / Restrictions Precautions Precautions: Fall Restrictions Weight Bearing Restrictions: No     Mobility  Bed Mobility                    Transfers Overall transfer level: Needs assistance Equipment used: Rolling walker (2 wheels) Transfers: Sit to/from Stand Sit to Stand: +2 safety/equipment, +2 physical assistance, From elevated surface, Min guard           General transfer comment: patient's Rw has to be placed very close to bed in order to push up, patient stood x 2 from elevated bed maximally raised. Patient counts and uses momentum to power up.    Ambulation/Gait Ambulation/Gait assistance: Min assist, +2 physical assistance, +2 safety/equipment Gait  Distance (Feet): 15 Feet (then stepped x 5 along bed.) Assistive device: Rolling walker (2 wheels) Gait Pattern/deviations: Step-to pattern Gait velocity: decr     General Gait Details: patient goes very slow, step at a time , able to turn RW to ambulate in the room   Stairs             Wheelchair Mobility    Modified Rankin (Stroke Patients Only)       Balance   Sitting-balance support: Feet supported, No upper extremity supported Sitting balance-Leahy Scale: Good     Standing balance support: Reliant on assistive device for balance, Bilateral upper extremity supported Standing balance-Leahy Scale: Poor Standing balance comment: reliant on support of RW                            Cognition Arousal/Alertness: Awake/alert                                              Exercises      General Comments        Pertinent Vitals/Pain Pain Assessment Faces Pain Scale: Hurts a little bit Pain Location: abdomen Pain Descriptors / Indicators: Discomfort Pain Intervention(s): Premedicated before session, Monitored during session    Home Living                          Prior Function  PT Goals (current goals can now be found in the care plan section) Progress towards PT goals: Progressing toward goals    Frequency    Min 3X/week      PT Plan Current plan remains appropriate    Co-evaluation PT/OT/SLP Co-Evaluation/Treatment: Yes Reason for Co-Treatment: Complexity of the patient's impairments (multi-system involvement);For patient/therapist safety;To address functional/ADL transfers PT goals addressed during session: Mobility/safety with mobility OT goals addressed during session: ADL's and self-care      AM-PAC PT "6 Clicks" Mobility   Outcome Measure  Help needed turning from your back to your side while in a flat bed without using bedrails?: A Little Help needed moving from lying on your back  to sitting on the side of a flat bed without using bedrails?: A Little Help needed moving to and from a bed to a chair (including a wheelchair)?: A Little Help needed standing up from a chair using your arms (e.g., wheelchair or bedside chair)?: A Lot Help needed to walk in hospital room?: A Lot Help needed climbing 3-5 steps with a railing? : Total 6 Click Score: 14    End of Session Equipment Utilized During Treatment: Gait belt Activity Tolerance: Patient limited by fatigue Patient left: in bed;with call bell/phone within reach;with nursing/sitter in room Nurse Communication: Mobility status PT Visit Diagnosis: Unsteadiness on feet (R26.81);Difficulty in walking, not elsewhere classified (R26.2);Pain     Time: 1015-1100 PT Time Calculation (min) (ACUTE ONLY): 45 min  Charges:  $Gait Training: 8-22 mins $Therapeutic Activity: 8-22 mins                     [place] Office [phone] Weekend [phone]    Claretha Cooper 06/27/2022, 2:08 PM

## 2022-06-27 NOTE — Progress Notes (Signed)
Patient alert and oriented, Post op day 7.  Provided support and encouragement.  Encouraged pulmonary toilet, ambulation (working with PT) and small sips of liquids.  Pt states that he did not receive PT over the weekend, but was doing some exercises in the bed.  Pt is having more loose stools with Ensure Protein Max.  RN has given pt Unjury Protein Chicken Soup which patient is able to tolerate.  Also discussed with patient that daughter and/or friend can bring in home protein if necessary.  All questions answered.  Will continue to monitor as patient appeals insurance denial for inpatient rehab.

## 2022-06-27 NOTE — Progress Notes (Signed)
Progress Note: Metabolic and Bariatric Surgery Service   Chief Complaint/Subjective: Pain controlled, protein shakes running "straight through" tolerating liquids and soup and yogurt  Objective: Vital signs in last 24 hours: Temp:  [98.2 F (36.8 C)-98.5 F (36.9 C)] 98.2 F (36.8 C) (10/09 1320) Pulse Rate:  [73-80] 73 (10/09 1320) Resp:  [16-18] 16 (10/09 1320) BP: (127-136)/(74-90) 134/80 (10/09 1320) SpO2:  [97 %-98 %] 98 % (10/09 1320) Last BM Date : 06/26/22  Intake/Output from previous day: 10/08 0701 - 10/09 0700 In: 1200 [P.O.:1200] Out: 1400 [Urine:1400] Intake/Output this shift: Total I/O In: 240 [P.O.:240] Out: 300 [Urine:300]  Lungs: nonlabored  Cardiovascular: RRR  Abd: soft, incisions c/d/i  Extremities: no edema  Neuro: AOx4  Lab Results: CBC  No results for input(s): "WBC", "HGB", "HCT", "PLT" in the last 72 hours. BMET No results for input(s): "NA", "K", "CL", "CO2", "GLUCOSE", "BUN", "CREATININE", "CALCIUM" in the last 72 hours. PT/INR No results for input(s): "LABPROT", "INR" in the last 72 hours. ABG No results for input(s): "PHART", "HCO3" in the last 72 hours.  Invalid input(s): "PCO2", "PO2"  Studies/Results:  Anti-infectives: Anti-infectives (From admission, onward)    Start     Dose/Rate Route Frequency Ordered Stop   06/20/22 1300  cefoTEtan (CEFOTAN) 2 g in sodium chloride 0.9 % 100 mL IVPB        2 g 200 mL/hr over 30 Minutes Intravenous On call to O.R. 06/20/22 1249 06/20/22 1515       Medications: Scheduled Meds:  acetaminophen  1,000 mg Oral Q8H   Or   acetaminophen (TYLENOL) oral liquid 160 mg/5 mL  1,000 mg Oral Q8H   allopurinol  100 mg Oral QODAY   amLODipine  10 mg Oral Daily   carvedilol  3.125 mg Oral BID WC   Chlorhexidine Gluconate Cloth  6 each Topical Daily   docusate sodium  100 mg Oral BID   heparin injection (subcutaneous)  5,000 Units Subcutaneous Q8H   insulin aspart  0-20 Units Subcutaneous  TID WC   insulin aspart  0-5 Units Subcutaneous QHS   pantoprazole (PROTONIX) IV  40 mg Intravenous QHS   Ensure Max Protein  2 oz Oral Q2H   Continuous Infusions: PRN Meds:.hydrALAZINE, lactase, ondansetron (ZOFRAN) IV, mouth rinse, oxyCODONE, simethicone  Assessment/Plan: Patient Active Problem List   Diagnosis Date Noted   Morbid obesity with BMI of 60.0-69.9, adult ([place]) 06/20/2022   Right shoulder pain 05/11/2022   Hordeolum externum of right eye 02/18/2022   Nonobstructive transaminitis 06/28/2021   Decubitus ulcer of posterior thigh, stage 2 ([place]) 06/28/2021   Acute kidney injury (resolved) 06/28/2021   Pre-diabetes 06/10/2021   Obesity, Class III, BMI 40-49.9 (morbid obesity) ([place]) 06/10/2021   Osteoarthritis 06/10/2021   Pressure injury of skin 06/09/2021   Respiratory failure ([place]) 01/75/1025   Acute diastolic CHF (congestive heart failure) ([place]) 05/31/2021   CHF (congestive heart failure) ([place]) 05/31/2021   Hypoxia 05/31/2021   Gout, unspecified 03/10/2021   Edema 03/10/2021   Diabetes mellitus without complication ([place]) 85/27/7824   Muscle spasms of both lower extremities 07/29/2020   Bedbound 05/22/2020   Unilateral osteoarthritis of hip, right 12/27/2019   Severe obstructive sleep apnea 02/22/2018   Hypertriglyceridemia 08/24/2017   Low HDL (under 40) 23/53/6144   Metabolic syndrome 31/54/0086   Morbid obesity ([place]) 10/14/2013   HTN (hypertension) 10/14/2013   s/p Procedure(s): XI ROBOTIC SLEEVE GASTRECTOMY UPPER GI ENDOSCOPY 06/20/2022 -insurance initially denies acute rehab, being appealed -PT/OT  Disposition:  LOS: 7 days  The patient will be in the hospital for normal postop protocol  Rodman Pickle, MD [phone] [facility]

## 2022-06-28 ENCOUNTER — Telehealth: Payer: Self-pay | Admitting: Family Medicine

## 2022-06-28 ENCOUNTER — Encounter: Payer: Self-pay | Admitting: Family Medicine

## 2022-06-28 DIAGNOSIS — Z7401 Bed confinement status: Secondary | ICD-10-CM

## 2022-06-28 DIAGNOSIS — I5032 Chronic diastolic (congestive) heart failure: Secondary | ICD-10-CM

## 2022-06-28 LAB — GLUCOSE, CAPILLARY
Glucose-Capillary: 88 mg/dL (ref 70–99)
Glucose-Capillary: 100 mg/dL — ABNORMAL HIGH (ref 70–99)
Glucose-Capillary: 101 mg/dL — ABNORMAL HIGH (ref 70–99)
Glucose-Capillary: 99 mg/dL (ref 70–99)

## 2022-06-28 NOTE — Telephone Encounter (Signed)
Patient needs a new mattress ordered for his hospital bed and refill on his oxycodone. LOV 05/10/22, NOV not scheduled, RF 06/23/22 #10/0

## 2022-06-28 NOTE — TOC Transition Note (Signed)
Transition of Care [facility]) - CM/SW Discharge Note   Patient Details  Name: Michael Payne MRN: [id number] Date of Birth: [birth year]  Transition of Care [facility] CM/SW Contact:  Servando Snare, LCSW Phone Number: 06/28/2022, 11:48 AM   Clinical Narrative:   Patient originally was planning to go to [facility], but insurance denied. Patient is now going home with HHPT/OT. Patient has chosen [facility] for [facility] as he has been active with them in the past. Patient also reports that he receives 17 hours a week of [facility]. Patient to dc by EMS>      Barriers to Discharge: No Barriers Identified   Patient Goals and CMS Choice Patient states their goals for this hospitalization and ongoing recovery are:: Unable to assess   Choice offered to / list presented to : Patient  Discharge Placement                       Discharge Plan and Services In-house Referral: NA Discharge Planning Services: CM Consult Post Acute Care Choice: IP Rehab          DME Arranged: N/A DME Agency: NA       HH Arranged: PT, OT HH Agency: [place] Date [place]: 06/28/22 Time [place]: 1148 Representative spoke with at [place]: [place] ([place]) Interventions     Readmission Risk Interventions    06/22/2022    1:29 PM 06/28/2021    9:01 AM 06/23/2021   10:53 AM  Readmission Risk Prevention Plan  Post Dischage Appt Complete Complete Complete  Medication Screening Complete Complete Complete  Transportation Screening Complete Complete Complete

## 2022-06-28 NOTE — Progress Notes (Signed)
Inpatient Rehabilitation Admissions Coordinator   I have received a denial from the appeal with [facility] . I spoke with patient by phone and he is aware. Acute team and TOC made aware. We will sign off at this time.  Danne Baxter, RN, MSN Rehab Admissions Coordinator [phone] 06/28/2022 10:01 AM

## 2022-06-28 NOTE — Telephone Encounter (Signed)
  Encourage patient to contact the pharmacy for refills or they can request refills through [facility]  Did the patient contact the pharmacy: No  LAST APPOINTMENT DATE: 05/10/2022  NEXT APPOINTMENT DATE: N/A  MEDICATION: oxyCODONE-acetaminophen (PERCOCET) 10-325 MG tablet  Is the patient out of medication? No  PHARMACY: [id number] - [place], [place] - [address]  Let patient know to contact pharmacy at the end of the day to make sure medication is ready.  Please notify patient to allow 48-72 hours to process

## 2022-06-28 NOTE — Telephone Encounter (Signed)
Refill request was also sent in [place] message earlier.

## 2022-06-28 NOTE — Discharge Summary (Signed)
Physician Discharge Summary  Michael Payne ZHG:[id number] DOB: 09-30-1970 DOA: 06/20/2022  PCP: [name], MD  Admit date: 06/20/2022 Discharge date: 06/28/2022  Recommendations for Outpatient Follow-up:  Home health PT and OT (include homehealth, outpatient follow-up instructions, specific recommendations for PCP to follow-up on, etc.)   Follow-up Information     [name], [name], MD. Go on 07/13/2022.   Specialty: Surgery Why: at 10:40am. Please arrive 15 minutes prior to your appointment time. Thank you. Contact information: [place]  [place] [address] [phone]                Discharge Diagnoses:  Principal Problem:   Morbid obesity with BMI of 60.0-69.9, adult [name])   Surgical Procedure: laparoscopic sleeve gastrectomy, upper endoscopy  Discharge Condition: Good Disposition: Home  Diet recommendation: Postoperative sleeve gastrectomy diet (liquids only)  Filed Weights   06/20/22 1254 06/24/22 1222  Weight: (!) 226.1 kg (!) 223.4 kg     Hospital Course:  The patient was admitted after undergoing laparoscopic sleeve gastrectomy. POD 0 he ambulated well. POD 1 he was started on the water diet protocol and tolerated 200 ml in the first shift. Once meeting the water amount he was advanced to bariatric protein shakes which they tolerated. He had mobility issues but was able to walk up to 18 ft. PMR was consulted and accepted him for inpatient rehab but his insurance denied it and an appeal. Therefore, decision was made to discharge to home with home health PT/OT. He was discharged home POD 8.  Treatments: surgery: laparoscopic sleeve gastrectomy  Discharge Instructions  Discharge Instructions     Ambulate hourly while awake   Complete by: As directed    Call MD for:  difficulty breathing, headache or visual disturbances   Complete by: As directed    Call MD for:  persistant dizziness or light-headedness   Complete by: As  directed    Call MD for:  persistant nausea and vomiting   Complete by: As directed    Call MD for:  redness, tenderness, or signs of infection (pain, swelling, redness, odor or green/yellow discharge around incision site)   Complete by: As directed    Call MD for:  severe uncontrolled pain   Complete by: As directed    Call MD for:  temperature >101 F   Complete by: As directed    Diet bariatric full liquid   Complete by: As directed    Face-to-face encounter (required for Medicare/Medicaid patients)   Complete by: As directed    I [name] certify that this patient is under my care and that I, or a nurse practitioner or physician's assistant working with me, had a face-to-face encounter that meets the physician face-to-face encounter requirements with this patient on 06/28/2022. The encounter with the patient was in whole, or in part for the following medical condition(s) which is the primary reason for home health care (List medical condition): morbid obesity   The encounter with the patient was in whole, or in part, for the following medical condition, which is the primary reason for home health care: morbid obesity   I certify that, based on my findings, the following services are medically necessary home health services: Physical therapy   Reason for Medically Necessary Home Health Services: Therapy- Personnel officer, Training and development officer and Stair Training   My clinical findings support the need for the above services: Unable to leave home safely without assistance and/or assistive device  Further, I certify that my clinical findings support that this patient is homebound due to: Ambulates short distances less than 300 feet   Home Health   Complete by: As directed    To provide the following care/treatments:  PT OT     Incentive spirometry   Complete by: As directed    Perform hourly while awake      Allergies as of 06/28/2022       Reactions   Lisinopril Shortness Of  Breath, Other (See Comments)   Causes cough   Aspirin Other (See Comments)   Gi upset/pain   Lactose Intolerance (gi) Diarrhea, Nausea And Vomiting, Other (See Comments)   Stomach cramps        Medication List     STOP taking these medications    acetaminophen 500 MG tablet Commonly known as: TYLENOL   furosemide 40 MG tablet Commonly known as: LASIX       TAKE these medications    allopurinol 100 MG tablet Commonly known as: ZYLOPRIM Take 1 tablet (100 mg total) by mouth every other day. What changed: when to take this   AMINO ACID PO Take 2 Scoops by mouth 2 (two) times a week.   amLODipine 10 MG tablet Commonly known as: NORVASC Take 1 tablet (10 mg total) by mouth daily. Notes to patient: Monitor Blood Pressure Daily and keep a log for primary care physician.  You may need to make changes to your medications with rapid weight loss.     ascorbic acid 500 MG tablet Commonly known as: VITAMIN C Take 500 mg by mouth daily.   carvedilol 3.125 MG tablet Commonly known as: COREG TAKE 1 TABLET BY MOUTH TWICE A DAY WITH A MEAL What changed:  how much to take how to take this when to take this additional instructions   celecoxib 100 MG capsule Commonly known as: CeleBREX Take 1 capsule (100 mg total) by mouth 2 (two) times daily. Notes to patient: Avoid NSAIDs for 6-8 weeks after surgery   Colchicine 0.6 MG Caps Commonly known as: Mitigare Take 0.5 tablets by mouth daily as needed (for gout.  okay to fill with mitigare).   ELDERBERRY PO Take 2 tablets by mouth daily.   enoxaparin 60 MG/0.6ML injection Commonly known as: LOVENOX Inject 0.6 mLs (60 mg total) into the skin every 12 (twelve) hours for 28 days.   gabapentin 100 MG capsule Commonly known as: NEURONTIN Take 2 capsules (200 mg total) by mouth every 12 (twelve) hours.   multivitamin with minerals tablet Take 1 tablet by mouth daily.   naloxone 4 MG/0.1ML Liqd nasal spray kit Commonly known  as: NARCAN Spray nostril if needed for opioid overuse- decreased consciousness, decreased respirations What changed:  how much to take how to take this when to take this additional instructions   ondansetron 4 MG disintegrating tablet Commonly known as: ZOFRAN-ODT Take 1 tablet (4 mg total) by mouth every 6 (six) hours as needed for nausea or vomiting.   oxyCODONE-acetaminophen 10-325 MG tablet Commonly known as: PERCOCET Take 1 tablet by mouth every 4 (four) hours as needed for pain.   pantoprazole 40 MG tablet Commonly known as: PROTONIX Take 1 tablet (40 mg total) by mouth daily.   potassium chloride 10 MEQ tablet Commonly known as: Klor-Con M10 Take 1 tablet (10 mEq total) by mouth daily.   Trulicity 3 IR/5.1OA Sopn Generic drug: Dulaglutide INJECT 3 MG AS DIRECTED ONCE A WEEK What changed: additional instructions  Follow-up Information     [name], [name], MD. Go on 07/13/2022.   Specialty: Surgery Why: at 10:40am. Please arrive 15 minutes prior to your appointment time. Thank you. Contact information: [place] [place] [address] [phone]                  The results of significant diagnostics from this hospitalization (including imaging, microbiology, ancillary and laboratory) are listed below for reference.    Significant Diagnostic Studies: DG Shoulder Right  Result Date: 06/04/2022 CLINICAL DATA:  Chronic right shoulder pain EXAM: RIGHT SHOULDER - 2+ VIEW COMPARISON:  None Available. FINDINGS: Limited exam because of portable technique and body habitus. Mild degenerative arthropathy of the [facility] joint and glenohumeral joint with some degree of joint space loss, sclerosis and bony spurring. No gross malalignment, displaced fracture, subluxation or dislocation. AC joint aligned without separation. IMPRESSION: Mild right shoulder degenerative arthropathy without acute finding by limited plain radiography. Electronically  Signed   By: Jerilynn Mages.  Shick M.D.   On: 06/04/2022 10:14   DG Chest Port 1 View  Result Date: 06/04/2022 CLINICAL DATA:  Preop for bariatric surgery EXAM: PORTABLE CHEST 1 VIEW COMPARISON:  06/10/2021 FINDINGS: Limited exam because of portable technique and body habitus. Exam is slightly rotated to the left. Similar enlargement of the cardiac silhouette with central vascular congestion. No large focal airspace process, collapse or consolidation. Negative for edema, large effusion or pneumothorax. IMPRESSION: Limited exam.  Similar cardiac enlargement without acute finding. Electronically Signed   By: Jerilynn Mages.  Shick M.D.   On: 06/04/2022 10:12    Labs: Basic Metabolic Panel: Recent Labs  Lab 06/22/22 0323 06/23/22 0502  NA 138 140  K 4.2 3.7  CL 102 104  CO2 29 28  GLUCOSE 118* 104*  BUN 11 11  CREATININE 0.76 0.61  CALCIUM 8.4* 8.5*   Liver Function Tests: No results for input(s): "AST", "ALT", "ALKPHOS", "BILITOT", "PROT", "ALBUMIN" in the last 168 hours.  CBC: Recent Labs  Lab 06/22/22 0323 06/23/22 0502  WBC 9.8 7.6  NEUTROABS 7.0  --   HGB 12.2* 12.1*  HCT 40.4 40.1  MCV 96.9 96.6  PLT 226 215    CBG: Recent Labs  Lab 06/27/22 0752 06/27/22 1246 06/27/22 1625 06/27/22 2149 06/28/22 0717  GLUCAP 109* 99 88 100* 101*    Principal Problem:   Morbid obesity with BMI of 60.0-69.9, adult [facility])   VTE plan: I will prescribe outpatient chemical prophylaxis of enoxaparin due to this increased risk ([email or web address])  Time coordinating discharge: 30 min

## 2022-06-28 NOTE — Progress Notes (Signed)
Patient alert and oriented, Post op Day 8. Pain is controlled. Patient is tolerating fluids, advanced to protein shake, patient is tolerating well.  Reviewed Gastric sleeve discharge instructions with patient and patient is able to articulate understanding.  Provided information on BELT program, Support Group and [facility] outpatient pharmacy. All questions answered, will continue to monitor. Will work with RN with discharge process to have patient discharged home.

## 2022-06-29 ENCOUNTER — Telehealth: Payer: Self-pay | Admitting: *Deleted

## 2022-06-29 DIAGNOSIS — I10 Essential (primary) hypertension: Secondary | ICD-10-CM | POA: Diagnosis not present

## 2022-06-29 MED ORDER — OXYCODONE-ACETAMINOPHEN 10-325 MG PO TABS: 1.0000 | ORAL_TABLET | Freq: Four times a day (QID) | ORAL | 0 refills | Status: DC | PRN

## 2022-06-29 NOTE — Patient Outreach (Signed)
  Care Coordination [facility] Note Transition Care Management Unsuccessful Follow-up Telephone Call  Date of discharge and from where:  [phone] [facility]  Attempts:  1st Attempt  Reason for unsuccessful TCM follow-up call:  Left voice message  [place] Management [phone]

## 2022-06-29 NOTE — Addendum Note (Signed)
Addended by: [name] on: 06/29/2022 01:36 PM   Modules accepted: Orders

## 2022-06-29 NOTE — Telephone Encounter (Signed)
I sent the prescription for his medication. Please send an order for replacement mattress. Dx I50.9, Z74.01, E66.01.  thanks.

## 2022-06-29 NOTE — Telephone Encounter (Signed)
Sent. Thanks.   

## 2022-06-29 NOTE — Patient Outreach (Signed)
  Care Coordination [facility] Note Transition Care Management Unsuccessful Follow-up Telephone Call  Date of discharge and from where:  [phone]  Attempts:  2nd Attempt  Reason for unsuccessful TCM follow-up call:  Left voice message [place] Care Management [phone]

## 2022-06-30 ENCOUNTER — Telehealth: Payer: Self-pay | Admitting: Family Medicine

## 2022-06-30 ENCOUNTER — Telehealth: Payer: Self-pay | Admitting: *Deleted

## 2022-06-30 DIAGNOSIS — I10 Essential (primary) hypertension: Secondary | ICD-10-CM | POA: Diagnosis not present

## 2022-06-30 NOTE — Telephone Encounter (Signed)
Home Health verbal orders Caller Name: [place] Name: [facility]  Callback number: [phone], secured, no ext  Requesting OT/PT/Skilled nursing/Social Work/Speech: hh Physical therapy   Reason:  Frequency: once a week for one week , twice a week for 8 weeks  Please forward to [facility] pool or providers CMA

## 2022-06-30 NOTE — Patient Outreach (Signed)
  Care Coordination TOC Note Transition Care Management Follow-up Telephone Call Date of discharge and from where: [facility] [phone] How have you been since you were released from the hospital? I have been having some diarrhea usually 3-4 a day.  Any questions or concerns? Yes  Concern with the diarrhea. Per patient usually 3-4 a day. Encourage patient to take fluids. Monitor the number of stool. If continue notify the Dr to prevent dehydration.   Items Reviewed: Did the pt receive and understand the discharge instructions provided? Yes  Medications obtained and verified?  Per patient he received all except the Zofran. He stated that he is not having any nausea and vomiting.  Other? Y explained the importance of taking the Lovenox even though he does not want to.  Any new allergies since your discharge? No  Dietary orders reviewed? Yes Bariatric full liquid Do you have support at home? Yes   Home Care and Equipment/Supplies: Were home health services ordered? yes If so, what is the name of the agency? [facility]  Has the agency set up a time to come to the patient's home? yes Were any new equipment or medical supplies ordered?  No What is the name of the medical supply agency? N  Were you able to get the supplies/equipment? not applicable Do you have any questions related to the use of the equipment or supplies? No  Functional Questionnaire: (I = Independent and D = Dependent) ADLs: D  Bathing/Dressing- D  Eating- I  Maintaining continence- D  Transferring/Ambulation- D  Managing Meds- I  Follow up appointments reviewed:  PCP Hospital f/u appt confirmed? [facility] f/u appt confirmed? Yes  S Scheduled to see Dr Thermon Leyland [phone] . Clarisa Kindred video visit [phone] 11:30 Are transportation arrangements needed? N If their condition worsens, is the pt aware to call PCP or go to the Emergency Dept.? Yes Was the patient provided with contact information  for the PCP's office or ED? Yes Was to pt encouraged to call back with questions or concerns? Yes  SDOH assessments and interventions completed:   Yes  Care Coordination Interventions Activated:  Yes   Care Coordination Interventions:  Patient is being followed by Quinn Plowman  10/26 3:00 PM    Encounter Outcome:  Pt. Visit Completed    [place] Management [phone]

## 2022-07-01 ENCOUNTER — Telehealth: Payer: Self-pay

## 2022-07-01 DIAGNOSIS — I10 Essential (primary) hypertension: Secondary | ICD-10-CM | POA: Diagnosis not present

## 2022-07-01 NOTE — Telephone Encounter (Signed)
Verbal orders left on secure VM

## 2022-07-01 NOTE — Telephone Encounter (Signed)
Please give the order.  Thanks.   

## 2022-07-01 NOTE — Chronic Care Management (AMB) (Addendum)
    Chronic Care Management Pharmacy Assistant   Name: ELBER GALYEAN  MRN: [id number] DOB: [birth year]  Reason for Encounter: Hospital Follow Up  Medications: Outpatient Encounter Medications as of 07/01/2022  Medication Sig   allopurinol (ZYLOPRIM) 100 MG tablet Take 1 tablet (100 mg total) by mouth every other day. (Patient taking differently: Take 100 mg by mouth 3 (three) times a week.)   Amino Acids (AMINO ACID PO) Take 2 Scoops by mouth 2 (two) times a week.   amLODipine (NORVASC) 10 MG tablet Take 1 tablet (10 mg total) by mouth daily.   carvedilol (COREG) 3.125 MG tablet TAKE 1 TABLET BY MOUTH TWICE A DAY WITH A MEAL (Patient taking differently: Take 3.125 mg by mouth 2 (two) times daily with a meal.)   celecoxib (CELEBREX) 100 MG capsule Take 1 capsule (100 mg total) by mouth 2 (two) times daily. (Patient not taking: Reported on 06/21/2022)   Colchicine (MITIGARE) 0.6 MG CAPS Take 0.5 tablets by mouth daily as needed (for gout.  okay to fill with mitigare). (Patient not taking: Reported on 06/21/2022)   Dulaglutide (TRULICITY) 3 YN/8.2NF SOPN INJECT 3 MG AS DIRECTED ONCE A WEEK (Patient taking differently: Inject 3 mg as directed once a week. Saturday)   ELDERBERRY PO Take 2 tablets by mouth daily.   enoxaparin (LOVENOX) 60 MG/0.6ML injection Inject 0.6 mLs (60 mg total) into the skin every 12 (twelve) hours for 28 days.   gabapentin (NEURONTIN) 100 MG capsule Take 2 capsules (200 mg total) by mouth every 12 (twelve) hours.   Multiple Vitamins-Minerals (MULTIVITAMIN WITH MINERALS) tablet Take 1 tablet by mouth daily.   naloxone (NARCAN) nasal spray 4 mg/0.1 mL Spray nostril if needed for opioid overuse- decreased consciousness, decreased respirations (Patient taking differently: Place 0.4 mg into the nose once.)   ondansetron (ZOFRAN-ODT) 4 MG disintegrating tablet Take 1 tablet (4 mg total) by mouth every 6 (six) hours as needed for nausea or vomiting.   oxyCODONE-acetaminophen  (PERCOCET) 10-325 MG tablet Take 1 tablet by mouth every 6 (six) hours as needed for pain.   pantoprazole (PROTONIX) 40 MG tablet Take 1 tablet (40 mg total) by mouth daily.   potassium chloride (KLOR-CON M10) 10 MEQ tablet Take 1 tablet (10 mEq total) by mouth daily.   vitamin C (ASCORBIC ACID) 500 MG tablet Take 500 mg by mouth daily.   No facility-administered encounter medications on file as of 07/01/2022.    Reviewed hospital notes for details of recent visit. Has patient been contacted by Transitions of Care team? Yes Has patient seen PCP/specialist for hospital follow up (summarize OV if yes): Yes  PCP 06/30/22 telephone orders  Brief summary of hospital course: Unable to review any information about  any hospital admission    Next CCM appt: none  Other upcoming appts: PCP appointment on 07/07/22 Telemedicine  Charlene Brooke, PharmD notified and will determine if action is needed.   Avel Sensor, [place]  [phone]

## 2022-07-02 ENCOUNTER — Telehealth (HOSPITAL_COMMUNITY): Payer: Self-pay | Admitting: *Deleted

## 2022-07-02 DIAGNOSIS — I10 Essential (primary) hypertension: Secondary | ICD-10-CM | POA: Diagnosis not present

## 2022-07-02 NOTE — Telephone Encounter (Signed)
1.  Tell me about your pain and pain management? Pt denies any current pain. Pt states that he has been experiencing some intermittent abdominal cramping while drinking that lasts for a few seconds and then "goes away".  Pt states that "you can just feel it going down".  Pt can tolerate protein shakes and water.  Pt instructed to call [facility] if pain worsens.  2.  Let's talk about fluid intake.  How much total fluid are you taking in? Pt states that he is getting in at least 70oz of fluid including protein shakes, bottled water, and broth.  Pt instructed to assess status and suggestions daily utilizing Hydration Action Plan on discharge folder and to call [facility] if in the "red zone".   3.  How much protein have you taken in the last 2 days? Pt states he is meeting his goal of 80g of protein each day with the protein shakes and powder.  4.  Have you had nausea?  Tell me about when have experienced nausea and what you did to help? Pt denies nausea.   5.  Has the frequency or color changed with your urine? Pt states that he is urinating "fine" with no changes in frequency or urgency.     6.  Tell me what your incisions look like? "Incisions look fine". Pt denies a fever, chills.  Pt states incisions are not swollen, open, or draining.  Pt encouraged to call [facility] if incisions change.   7.  Have you been passing gas? BM? Pt states that he is having BMs. Last BM 07/01/22. Pt states that the loose stools are getting better.  He thinks that he has found a protein powder that does not upset his stomach.   8.  If a problem or question were to arise who would you call?  Do you know contact numbers for [place], [facility], and [facility]? Pt denies dehydration symptoms.  Pt can describe s/sx of dehydration.  Pt knows to call [facility] for surgical, [facility] for nutrition, and [place] for non-urgent questions or concerns.   9.  How has the walking going? Pt states that he is working with in home PT.  Pt states that he had PT on Thursday and  stood up and took some steps.   10. Are you still using your incentive spirometer?  If so, how often? Pt states that he is doing the I.S.   Pt encouraged to use incentive spirometer, at least 10x every hour while awake until he sees the surgeon. Encouraged patient to continue with I.S. until he is up and more mobile.  11.  How are your vitamins and calcium going?  How are you taking them? Pt states that he will begin the supplements once they are delivered.  Reinforced education about taking supplements at least two hours apart.  LOVENOX: Pt states that he is taking the Lovenox injections without difficulty.  Reinforced education about proper hand hygiene, taking injections q12h and rotating injection sites.  Pt also instructed to monitor for unusual bruising and/or signs of bleeding.  Reminded patient that the first 30 days post-operatively are important for successful recovery.  Practice good hand hygiene, wearing a mask when appropriate (since optional in most places), and minimizing exposure to people who live outside of the home, especially if they are exhibiting any respiratory, GI, or illness-like symptoms.

## 2022-07-03 DIAGNOSIS — I10 Essential (primary) hypertension: Secondary | ICD-10-CM | POA: Diagnosis not present

## 2022-07-04 DIAGNOSIS — I10 Essential (primary) hypertension: Secondary | ICD-10-CM | POA: Diagnosis not present

## 2022-07-04 DIAGNOSIS — R32 Unspecified urinary incontinence: Secondary | ICD-10-CM | POA: Diagnosis not present

## 2022-07-05 ENCOUNTER — Encounter: Payer: Self-pay | Admitting: Dietician

## 2022-07-05 ENCOUNTER — Ambulatory Visit: Payer: Medicare HMO

## 2022-07-05 ENCOUNTER — Encounter: Payer: Medicare HMO | Attending: Family Medicine | Admitting: Dietician

## 2022-07-05 DIAGNOSIS — H9589 Other postprocedural complications and disorders of the ear and mastoid process, not elsewhere classified: Secondary | ICD-10-CM | POA: Insufficient documentation

## 2022-07-05 DIAGNOSIS — Z713 Dietary counseling and surveillance: Secondary | ICD-10-CM | POA: Diagnosis not present

## 2022-07-05 DIAGNOSIS — M160 Bilateral primary osteoarthritis of hip: Secondary | ICD-10-CM | POA: Insufficient documentation

## 2022-07-05 DIAGNOSIS — E119 Type 2 diabetes mellitus without complications: Secondary | ICD-10-CM | POA: Insufficient documentation

## 2022-07-05 DIAGNOSIS — Z6841 Body Mass Index (BMI) 40.0 and over, adult: Secondary | ICD-10-CM | POA: Insufficient documentation

## 2022-07-05 DIAGNOSIS — E669 Obesity, unspecified: Secondary | ICD-10-CM

## 2022-07-05 DIAGNOSIS — I10 Essential (primary) hypertension: Secondary | ICD-10-CM | POA: Diagnosis not present

## 2022-07-05 NOTE — Progress Notes (Signed)
2 Week Post-Operative Nutrition Class   Patient was seen on 07/05/2022 virtually for Post-Operative Nutrition education from patients home. Pt identified by name and date of birth. Pt agreeable to the limitations of this type of visit.   Surgery date: 06/20/2022 Surgery type: Sleeve Gastrectomy  Weight as of 06/24/2022: 492.5 lbs. Weight today: virtual appointment Bowel Habits: Every day to every other day no complaints     The following the learning objectives were met by the patient during this course: Identifies Phase 3 (Soft, High Proteins) Dietary Goals and will begin from 2 weeks post-operatively to 2 months post-operatively Identifies appropriate sources of fluids and proteins  Identifies appropriate fat sources and healthy verses unhealthy fat types   States protein recommendations and appropriate sources post-operatively Identifies the need for appropriate texture modifications, mastication, and bite sizes when consuming solids Identifies appropriate fat consumption and sources Identifies appropriate multivitamin and calcium sources post-operatively Describes the need for physical activity post-operatively and will follow MD recommendations States when to call healthcare provider regarding medication questions or post-operative complications   Handouts given during class include: Phase 3A: Soft, High Protein Diet Handout Phase 3 High Protein Meals Healthy Fats   Follow-Up Plan: Patient will follow-up at [facility] in 6 weeks for 2 month post-op nutrition visit for diet advancement per MD.

## 2022-07-06 ENCOUNTER — Other Ambulatory Visit: Payer: Self-pay | Admitting: Family Medicine

## 2022-07-06 DIAGNOSIS — I10 Essential (primary) hypertension: Secondary | ICD-10-CM | POA: Diagnosis not present

## 2022-07-07 ENCOUNTER — Telehealth (INDEPENDENT_AMBULATORY_CARE_PROVIDER_SITE_OTHER): Payer: Medicare HMO | Admitting: Family Medicine

## 2022-07-07 ENCOUNTER — Encounter: Payer: Self-pay | Admitting: Family Medicine

## 2022-07-07 DIAGNOSIS — M160 Bilateral primary osteoarthritis of hip: Secondary | ICD-10-CM

## 2022-07-07 DIAGNOSIS — I10 Essential (primary) hypertension: Secondary | ICD-10-CM | POA: Diagnosis not present

## 2022-07-07 NOTE — Progress Notes (Signed)
Virtual visit completed through WebEx or similar program Patient location: home  Provider location: [place] at [facility], office  Participants: Patient and me (unless stated otherwise below)  Limitations and rationale for visit method d/w patient.  Patient agreed to proceed.   CC:  HPI:  f/u from gastric surgery.    He is working back to solids and feels better after eating this AM.   No pain now from surgery.  No fevers.  He is still is in therapy.    Still on lovenox.  Discussed stopping trulicity since it had sig suppressed his appetite.  Discussed stopping potassium since he is off lasix.    Still dealing with hip and knee and shoulder pain at baseline.  He has been on opiates for that at baseline, without adverse effect and with some relief.  He isn't taking nsaids, d/w pt.  He is adherent to diet, working to get adequate water intake.    Prev 625 lbs, 493 lbs on admission. Down to 486 lbs on discharge.  He is going to try to weight today in therapy.    He is soon to restart with counseling.  D/w pt.    Meds and allergies reviewed.   ROS: Per HPI unless specifically indicated in ROS section   NAD Speech wnl  A/P:  Morbid obesity status post laparoscopic sleeve gastrectomy, having advance to solids and discharged home.  He is on follow-up with therapy at home to try to increase his mobility.  Diet discussed with patient.  Reasonable to stop Trulicity in the meantime.  Continue Lovenox until he has completed that course.  Would stop potassium since he is no longer taking Lasix.  Weight loss noted as above.  I thanked him for his effort.  Chronic joint pain.  Continue oxycodone at baseline.  Notify me when he needs refills.  No adverse effect on medication.

## 2022-07-08 DIAGNOSIS — I10 Essential (primary) hypertension: Secondary | ICD-10-CM | POA: Diagnosis not present

## 2022-07-09 DIAGNOSIS — I10 Essential (primary) hypertension: Secondary | ICD-10-CM | POA: Diagnosis not present

## 2022-07-10 DIAGNOSIS — I10 Essential (primary) hypertension: Secondary | ICD-10-CM | POA: Diagnosis not present

## 2022-07-10 NOTE — Assessment & Plan Note (Signed)
Morbid obesity status post laparoscopic sleeve gastrectomy, having advance to solids and discharged home.  He is on follow-up with therapy at home to try to increase his mobility.  Diet discussed with patient.  Reasonable to stop Trulicity in the meantime.  Continue Lovenox until he has completed that course.  Would stop potassium since he is no longer taking Lasix.  Weight loss noted as above.  I thanked him for his effort.

## 2022-07-10 NOTE — Assessment & Plan Note (Signed)
Chronic joint pain.  Continue oxycodone at baseline.  Notify me when he needs refills.  No adverse effect on medication.

## 2022-07-11 DIAGNOSIS — I10 Essential (primary) hypertension: Secondary | ICD-10-CM | POA: Diagnosis not present

## 2022-07-11 DIAGNOSIS — G4733 Obstructive sleep apnea (adult) (pediatric): Secondary | ICD-10-CM | POA: Diagnosis not present

## 2022-07-12 ENCOUNTER — Telehealth: Payer: Self-pay | Admitting: Family Medicine

## 2022-07-12 DIAGNOSIS — I10 Essential (primary) hypertension: Secondary | ICD-10-CM | POA: Diagnosis not present

## 2022-07-12 NOTE — Telephone Encounter (Signed)
Please give the order.  Thanks.   

## 2022-07-12 NOTE — Telephone Encounter (Signed)
Left verbal orders on secure VM 

## 2022-07-12 NOTE — Telephone Encounter (Signed)
Home Health verbal orders [place] Name: [facility]  Callback number: [phone]  Requesting OT/PT/Skilled nursing/Social Work/Speech:  Reason:OT  Frequency:1 x W 8 W  Please forward to [employer] pool or providers CMA

## 2022-07-13 DIAGNOSIS — I10 Essential (primary) hypertension: Secondary | ICD-10-CM | POA: Diagnosis not present

## 2022-07-14 ENCOUNTER — Telehealth: Payer: Self-pay

## 2022-07-14 ENCOUNTER — Ambulatory Visit: Payer: Self-pay

## 2022-07-14 DIAGNOSIS — I10 Essential (primary) hypertension: Secondary | ICD-10-CM | POA: Diagnosis not present

## 2022-07-14 NOTE — Patient Outreach (Signed)
  Care Coordination   Follow Up Visit Note   07/15/2022 Name: Michael Payne MRN: [id number] DOB: 1971/08/01  Michael Payne is a 51 y.o. year old male who sees [name], MD for primary care. I spoke with  Michael Payne by phone today.  What matters to the patients health and wellness today?  Patient states he had gastric bypass surgery on 06/20/22.  He states he is doing well and healing well.  Expressed excitement with finally having successful gastric bypass surgery.  He states he started eating solid foods approximately 1 1/2 week ago.  He reports having some nausea early on and states he has medication to manage this.  Patient states his home health services/ therapy has resumed and he restated his home exercises.  He states is able to walk to his porch and had the opportunity to sit outside.  Patient states the pain post surgery has been tolerable. Denies any pain today. He reports having post surgery follow up with his general surgeon and post hospital discharge follow up with his primary provider.   He states he is not able to get any of his CPAP supplies covered by insurance until he his seen by the pulmonologist and order is written.     Goals Addressed             This Visit's Progress    Patient Stated:  Ongoing weight loss and to have bariatric surgery       Care Coordination Interventions: Evaluation of current treatment plan related to bariatric surgery and patient's adherence to plan as established by provider Reviewed scheduled/upcoming provider appointments  Provided patient with contact phone number for his established pulmonary provider office.  Confirmed patient had post operative follow up with his general surgeon and post hospital discharge follow up with his primary care provider.  Assessed patients pain level and tolerance of solid foods intake           SDOH assessments and interventions completed:  No     Care Coordination Interventions  Activated:  Yes  Care Coordination Interventions:  Yes, provided   Follow up plan: Follow up call scheduled for 08/15/22 at 2 pm    Encounter Outcome:  Pt. Visit Completed   Quinn Plowman RN,BSN,CCM [place] [phone] direct line

## 2022-07-14 NOTE — Patient Outreach (Signed)
  Care Coordination   07/14/2022 Name: Michael Payne MRN: [id number] DOB: 10/29/1970   Care Coordination Outreach Attempts:  An unsuccessful telephone outreach was attempted for a scheduled appointment today.  Follow Up Plan:  Additional outreach attempts will be made to offer the patient care coordination information and services.   Encounter Outcome:  No Answer  Care Coordination Interventions Activated:  No   Care Coordination Interventions:  No, not indicated    Quinn Plowman RN,BSN,CCM [place] [phone] direct line

## 2022-07-15 DIAGNOSIS — I10 Essential (primary) hypertension: Secondary | ICD-10-CM | POA: Diagnosis not present

## 2022-07-17 DIAGNOSIS — I110 Hypertensive heart disease with heart failure: Secondary | ICD-10-CM | POA: Diagnosis not present

## 2022-07-17 DIAGNOSIS — M160 Bilateral primary osteoarthritis of hip: Secondary | ICD-10-CM | POA: Diagnosis not present

## 2022-07-17 DIAGNOSIS — K219 Gastro-esophageal reflux disease without esophagitis: Secondary | ICD-10-CM | POA: Diagnosis not present

## 2022-07-17 DIAGNOSIS — Z87891 Personal history of nicotine dependence: Secondary | ICD-10-CM

## 2022-07-17 DIAGNOSIS — M19019 Primary osteoarthritis, unspecified shoulder: Secondary | ICD-10-CM | POA: Diagnosis not present

## 2022-07-17 DIAGNOSIS — Z6841 Body Mass Index (BMI) 40.0 and over, adult: Secondary | ICD-10-CM

## 2022-07-17 DIAGNOSIS — R7303 Prediabetes: Secondary | ICD-10-CM | POA: Diagnosis not present

## 2022-07-17 DIAGNOSIS — G4733 Obstructive sleep apnea (adult) (pediatric): Secondary | ICD-10-CM | POA: Diagnosis not present

## 2022-07-17 DIAGNOSIS — I10 Essential (primary) hypertension: Secondary | ICD-10-CM | POA: Diagnosis not present

## 2022-07-17 DIAGNOSIS — Z8701 Personal history of pneumonia (recurrent): Secondary | ICD-10-CM

## 2022-07-17 DIAGNOSIS — Z9181 History of falling: Secondary | ICD-10-CM

## 2022-07-17 DIAGNOSIS — Z7901 Long term (current) use of anticoagulants: Secondary | ICD-10-CM

## 2022-07-17 DIAGNOSIS — D649 Anemia, unspecified: Secondary | ICD-10-CM | POA: Diagnosis not present

## 2022-07-17 DIAGNOSIS — Z48815 Encounter for surgical aftercare following surgery on the digestive system: Secondary | ICD-10-CM | POA: Diagnosis not present

## 2022-07-17 DIAGNOSIS — Z87442 Personal history of urinary calculi: Secondary | ICD-10-CM

## 2022-07-17 DIAGNOSIS — I509 Heart failure, unspecified: Secondary | ICD-10-CM | POA: Diagnosis not present

## 2022-07-18 DIAGNOSIS — I10 Essential (primary) hypertension: Secondary | ICD-10-CM | POA: Diagnosis not present

## 2022-07-19 DIAGNOSIS — I10 Essential (primary) hypertension: Secondary | ICD-10-CM | POA: Diagnosis not present

## 2022-07-20 DIAGNOSIS — I10 Essential (primary) hypertension: Secondary | ICD-10-CM | POA: Diagnosis not present

## 2022-07-21 DIAGNOSIS — I10 Essential (primary) hypertension: Secondary | ICD-10-CM | POA: Diagnosis not present

## 2022-07-22 DIAGNOSIS — I10 Essential (primary) hypertension: Secondary | ICD-10-CM | POA: Diagnosis not present

## 2022-07-23 DIAGNOSIS — I10 Essential (primary) hypertension: Secondary | ICD-10-CM | POA: Diagnosis not present

## 2022-07-24 DIAGNOSIS — I10 Essential (primary) hypertension: Secondary | ICD-10-CM | POA: Diagnosis not present

## 2022-07-25 ENCOUNTER — Telehealth: Payer: Self-pay

## 2022-07-25 DIAGNOSIS — I10 Essential (primary) hypertension: Secondary | ICD-10-CM | POA: Diagnosis not present

## 2022-07-26 ENCOUNTER — Ambulatory Visit: Payer: Medicare HMO | Admitting: Pulmonary Disease

## 2022-07-26 ENCOUNTER — Telehealth: Payer: Self-pay | Admitting: Pulmonary Disease

## 2022-07-26 DIAGNOSIS — I10 Essential (primary) hypertension: Secondary | ICD-10-CM | POA: Diagnosis not present

## 2022-07-26 DIAGNOSIS — J9610 Chronic respiratory failure, unspecified whether with hypoxia or hypercapnia: Secondary | ICD-10-CM | POA: Diagnosis not present

## 2022-07-26 NOTE — Telephone Encounter (Signed)
Sir before I call patient back are you ok with doing a video visit for patient. LOV was 07/2021. This visit is for cpap compliance and supplies.  Please advise sir

## 2022-07-27 DIAGNOSIS — I10 Essential (primary) hypertension: Secondary | ICD-10-CM | POA: Diagnosis not present

## 2022-07-28 ENCOUNTER — Other Ambulatory Visit: Payer: Self-pay | Admitting: Family Medicine

## 2022-07-28 DIAGNOSIS — I10 Essential (primary) hypertension: Secondary | ICD-10-CM | POA: Diagnosis not present

## 2022-07-28 NOTE — Telephone Encounter (Signed)
Refill request for   oxyCODONE-acetaminophen (PERCOCET) 10-325 MG tablet    LOV - 07/07/22 Next OV - not scheduled Last refill - 06/29/22 #120/0

## 2022-07-29 DIAGNOSIS — I10 Essential (primary) hypertension: Secondary | ICD-10-CM | POA: Diagnosis not present

## 2022-07-29 MED ORDER — OXYCODONE-ACETAMINOPHEN 10-325 MG PO TABS: 1.0000 | ORAL_TABLET | Freq: Four times a day (QID) | ORAL | 0 refills | Status: DC | PRN

## 2022-07-30 DIAGNOSIS — I10 Essential (primary) hypertension: Secondary | ICD-10-CM | POA: Diagnosis not present

## 2022-07-31 DIAGNOSIS — I10 Essential (primary) hypertension: Secondary | ICD-10-CM | POA: Diagnosis not present

## 2022-08-01 DIAGNOSIS — I10 Essential (primary) hypertension: Secondary | ICD-10-CM | POA: Diagnosis not present

## 2022-08-02 DIAGNOSIS — I10 Essential (primary) hypertension: Secondary | ICD-10-CM | POA: Diagnosis not present

## 2022-08-03 DIAGNOSIS — I10 Essential (primary) hypertension: Secondary | ICD-10-CM | POA: Diagnosis not present

## 2022-08-04 DIAGNOSIS — I10 Essential (primary) hypertension: Secondary | ICD-10-CM | POA: Diagnosis not present

## 2022-08-05 DIAGNOSIS — I10 Essential (primary) hypertension: Secondary | ICD-10-CM | POA: Diagnosis not present

## 2022-08-05 NOTE — Telephone Encounter (Signed)
Attempted to call pt but unable to reach. Left message for him to return call. °

## 2022-08-05 NOTE — Telephone Encounter (Signed)
That's fine with me if patient has transportation issues

## 2022-08-06 DIAGNOSIS — I10 Essential (primary) hypertension: Secondary | ICD-10-CM | POA: Diagnosis not present

## 2022-08-07 DIAGNOSIS — I10 Essential (primary) hypertension: Secondary | ICD-10-CM | POA: Diagnosis not present

## 2022-08-08 DIAGNOSIS — I10 Essential (primary) hypertension: Secondary | ICD-10-CM | POA: Diagnosis not present

## 2022-08-09 DIAGNOSIS — I10 Essential (primary) hypertension: Secondary | ICD-10-CM | POA: Diagnosis not present

## 2022-08-10 ENCOUNTER — Telehealth: Payer: Self-pay | Admitting: Skilled Nursing Facility1

## 2022-08-10 DIAGNOSIS — I10 Essential (primary) hypertension: Secondary | ICD-10-CM | POA: Diagnosis not present

## 2022-08-10 NOTE — Telephone Encounter (Signed)
Called pt at the direction of bariatric nurse coordinator crystal.   LVM

## 2022-08-10 NOTE — Telephone Encounter (Signed)
Gave pt another call bat direction of PA Puja.   Pt states he has had his surgery 06/20/2022.  Pt wonders if he can have beans. Dietitian advised yes.   Walking some with his walker but without a ramp is not able to leave the house just yet. Pt states his daughter is still helping him.   Did advance pt to non starchy vegetables and set up an appt for next week. As pt has not had an appt yet unsure as to why.

## 2022-08-11 DIAGNOSIS — I10 Essential (primary) hypertension: Secondary | ICD-10-CM | POA: Diagnosis not present

## 2022-08-12 DIAGNOSIS — I10 Essential (primary) hypertension: Secondary | ICD-10-CM | POA: Diagnosis not present

## 2022-08-12 NOTE — Telephone Encounter (Signed)
Called patient. He was having phone difficulties. Will call back later.

## 2022-08-13 DIAGNOSIS — I10 Essential (primary) hypertension: Secondary | ICD-10-CM | POA: Diagnosis not present

## 2022-08-14 DIAGNOSIS — I10 Essential (primary) hypertension: Secondary | ICD-10-CM | POA: Diagnosis not present

## 2022-08-15 ENCOUNTER — Ambulatory Visit: Payer: Self-pay

## 2022-08-15 DIAGNOSIS — I10 Essential (primary) hypertension: Secondary | ICD-10-CM | POA: Diagnosis not present

## 2022-08-15 NOTE — Patient Outreach (Signed)
  Care Coordination   Follow Up Visit Note   08/15/2022 Name: Michael Payne MRN: [id number] DOB: 1971-04-05  Michael Payne is a 51 y.o. year old male who sees Joaquim Nam, MD for primary care. I spoke with  Michael Payne by phone today.  What matters to the patients health and wellness today?  Patient states he continues to do well from the bariatric surgery. He states he continues to have home health therapy services.   He voiced his excitement with being able to walk into his kitchen to cook.  He reports eating non starch vegetables / beans now and tolerating well.  Patient states he continues to exercise 5 days per week.     Goals Addressed             This Visit's Progress    Patient Stated:  Ongoing weight loss       Care Coordination Interventions: Evaluation of current treatment plan related to ongoing weight loss and patient's adherence to plan as established by provider Reviewed scheduled/upcoming provider appointments  Discussed patients diet management:  able to eat non starch vegetables / beans.  Remain hydrated Assessed patients pain level and ongoing tolerance of solid foods intake Advised to take medication as prescribed. Advised to contact provider or surgeon for any new or ongoing symptoms.            SDOH assessments and interventions completed:  No     Care Coordination Interventions:  Yes, provided   Follow up plan: Follow up call scheduled for 10/13/21 at 11 am    Encounter Outcome:  Pt. Visit Completed   George Ina RN,BSN,CCM [facility] Care Coordination [phone] direct line

## 2022-08-16 DIAGNOSIS — I10 Essential (primary) hypertension: Secondary | ICD-10-CM | POA: Diagnosis not present

## 2022-08-17 ENCOUNTER — Encounter: Payer: Self-pay | Admitting: Skilled Nursing Facility1

## 2022-08-17 ENCOUNTER — Encounter: Payer: Medicare HMO | Attending: Family Medicine | Admitting: Skilled Nursing Facility1

## 2022-08-17 DIAGNOSIS — I10 Essential (primary) hypertension: Secondary | ICD-10-CM | POA: Diagnosis not present

## 2022-08-17 DIAGNOSIS — E119 Type 2 diabetes mellitus without complications: Secondary | ICD-10-CM | POA: Insufficient documentation

## 2022-08-17 DIAGNOSIS — G4733 Obstructive sleep apnea (adult) (pediatric): Secondary | ICD-10-CM | POA: Insufficient documentation

## 2022-08-17 DIAGNOSIS — Z9884 Bariatric surgery status: Secondary | ICD-10-CM | POA: Insufficient documentation

## 2022-08-17 DIAGNOSIS — Z713 Dietary counseling and surveillance: Secondary | ICD-10-CM | POA: Insufficient documentation

## 2022-08-17 DIAGNOSIS — I509 Heart failure, unspecified: Secondary | ICD-10-CM | POA: Diagnosis not present

## 2022-08-17 NOTE — Progress Notes (Signed)
Bariatric Nutrition Follow-Up Visit Medical Nutrition Therapy    NUTRITION ASSESSMENT    Anthropometrics  Surgery date: 06/20/2022 Surgery type: Sleeve Gastrectomy  Weight as of 07/10/2022: 443 lbs.  Weight today: phone appointment  Clinical  Medical hx: CHF, Respiratory Failure, Diabetes, osteoarthritis, OSA Medications: allopurinol, Trulicity, furosemide, oxycodone, amlodpine, potassium-chloride, tylenol, vitamin c, multi  Labs: A1C 6.5 Notable signs/symptoms:  Any previous deficiencies? No   Lifestyle & Dietary Hx   Pt states he sits up when he eats but does not always sit up on the edge of the bed when he drinks.   Pt states for thanksgiving he did get up and cook something and yesterday he got into the living room and did some christmas decorations while in the [place] trying to push himself around.  Pt states he found out the hard way he needs to make sure he sits on the side of the bed to eat rather than laying down.  Pt states it has been difficult to not eat the amount he wants to eat.  Pt states it is okay for him to stand by himself without his PT being there but his walker is too far away: Dietitian advised maybe in the evening before bed he could have his walker next to his bed so he can stand in the morning.   Estimated daily fluid intake: 70 oz Estimated daily protein intake: 80 g Supplements: 4 times a day multi and calcium  Current average weekly physical activity: Pt is bedbound  24-Hr Dietary Recall First Meal: 2 [place] bacon and greek yogurt Snack:  3/4 cup nuts or yogurt Second Meal: [place] salad + mayo + mustard + onion Snack:  yogurt or nuts Third Meal: collard greens and [place] salad Snack: nuts Beverages: vitamin water, water  Post-Op Goals/ Signs/ Symptoms Using straws: no Drinking while eating: no Chewing/swallowing difficulties: no Changes in vision: no Changes to mood/headaches: no Hair loss/changes to skin/nails: no Difficulty  focusing/concentrating: no Sweating: no Limb weakness: no Dizziness/lightheadedness: no Palpitations: no  Carbonated/caffeinated beverages: no N/V/D/C/Gas: no Abdominal pain: no Dumping syndrome: no    NUTRITION DIAGNOSIS  Overweight/obesity (-3.3) related to past poor dietary habits and physical inactivity as evidenced by completed bariatric surgery and following dietary guidelines for continued weight loss and healthy nutrition status.     NUTRITION INTERVENTION Nutrition counseling (C-1) and education (E-2) to facilitate bariatric surgery goals, including: Diet advancement to the next phase (phase 4) now including non starchy vegetables The importance of consuming adequate calories as well as certain nutrients daily due to the body's need for essential vitamins, minerals, and fats The importance of daily physical activity and to reach a goal of at least 150 minutes of moderate to vigorous physical activity weekly (or as directed by their physician) due to benefits such as increased musculature and improved lab values The importance of intuitive eating specifically learning hunger-satiety cues and understanding the importance of learning a new body: The importance of mindful eating to avoid grazing behaviors   Goals: -Continue to aim for a minimum of 64 fluid ounces 7 days a week with at least 30 ounces being plain water  -Eat non-starchy vegetables 2 times a day 7 days a week  -Start out with soft cooked vegetables today and tomorrow; if tolerated begin to eat raw vegetables or cooked including salads  -Eat your 3 ounces of protein first then start in on your non-starchy vegetables; once you understand how much of your meal leads to satisfaction and not  full while still eating 3 ounces of protein and non-starchy vegetables you can eat them in any order   -Continue to aim for 30 minutes of activity at least 5 times a week  -Do NOT cook with/add to your food: alfredo sauce, cheese  sauce, barbeque sauce, ketchup, fat back, butter, bacon grease, grease, Crisco, OR SUGAR  -be sure to sit up completely when eating and drinking   Handouts Provided Include  Non starchy vegetables   Learning Style & Readiness for Change Teaching method utilized: Visual & Auditory  Demonstrated degree of understanding via: Teach Back  Readiness Level: action Barriers to learning/adherence to lifestyle change: bedbound  RD's Notes for Next Visit Assess adherence to pt chosen goals    MONITORING & EVALUATION Dietary intake, weekly physical activity, body weight  Next Steps Patient is to follow-up in 3 months

## 2022-08-18 DIAGNOSIS — I10 Essential (primary) hypertension: Secondary | ICD-10-CM | POA: Diagnosis not present

## 2022-08-19 ENCOUNTER — Telehealth (INDEPENDENT_AMBULATORY_CARE_PROVIDER_SITE_OTHER): Payer: Medicare HMO | Admitting: Pulmonary Disease

## 2022-08-19 ENCOUNTER — Encounter: Payer: Self-pay | Admitting: Pulmonary Disease

## 2022-08-19 DIAGNOSIS — G4733 Obstructive sleep apnea (adult) (pediatric): Secondary | ICD-10-CM | POA: Diagnosis not present

## 2022-08-19 DIAGNOSIS — Z6841 Body Mass Index (BMI) 40.0 and over, adult: Secondary | ICD-10-CM

## 2022-08-19 DIAGNOSIS — Z9989 Dependence on other enabling machines and devices: Secondary | ICD-10-CM | POA: Diagnosis not present

## 2022-08-19 DIAGNOSIS — E662 Morbid (severe) obesity with alveolar hypoventilation: Secondary | ICD-10-CM

## 2022-08-19 DIAGNOSIS — I10 Essential (primary) hypertension: Secondary | ICD-10-CM | POA: Diagnosis not present

## 2022-08-19 NOTE — Patient Instructions (Signed)
DME referral for CPAP supplies  [facility]  Continue using CPAP  Not able to tolerate BiPAP but has been using his CPAP on a nightly basis

## 2022-08-19 NOTE — Progress Notes (Signed)
Virtual Visit via Video Note  I connected with Michael Payne on 08/19/22 at  3:45 PM EST by a video enabled telemedicine application and verified that I am speaking with the correct person using two identifiers.  Location: Patient: At home  Provider: [address]   I discussed the limitations of evaluation and management by telemedicine and the availability of in person appointments. The patient expressed understanding and agreed to proceed.  History of Present Illness: Patient with a history of sleep disordered breathing   Recently had gastric sleeve surgery  Is down over 40 pounds since his surgery but prior to that lost over 100 pounds  He continues to feel well without significant problems  With his weight loss, was not able to tolerate his BiPAP and switched back to the CPAP that he was using previously He does use the CPAP nightly without difficulty Wakes up feeling like he is at a good nights rest   Observations/Objective: He looks well Denies any significant complaints or concerns Appropriate and affect  Assessment and Plan: Severe obstructive sleep apnea  Currently on CPAP of 13  CPAP settings was described from his sleep study from 2019 showing optimal treatment of his sleep disordered breathing with CPAP of 13  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 The patient was provided an opportunity to ask questions and all were answered. The patient agreed with the plan and demonstrated an understanding of the instructions.   The patient was advised to call back or seek an in-person evaluation if the symptoms worsen or if the condition fails to  improve as anticipated.  I provided 20 minutes of non-face-to-face time during this encounter.   Michael Lightning, MD

## 2022-08-21 DIAGNOSIS — I10 Essential (primary) hypertension: Secondary | ICD-10-CM | POA: Diagnosis not present

## 2022-08-22 DIAGNOSIS — I10 Essential (primary) hypertension: Secondary | ICD-10-CM | POA: Diagnosis not present

## 2022-08-23 DIAGNOSIS — I10 Essential (primary) hypertension: Secondary | ICD-10-CM | POA: Diagnosis not present

## 2022-08-23 DIAGNOSIS — R32 Unspecified urinary incontinence: Secondary | ICD-10-CM | POA: Diagnosis not present

## 2022-08-24 DIAGNOSIS — I10 Essential (primary) hypertension: Secondary | ICD-10-CM | POA: Diagnosis not present

## 2022-08-25 ENCOUNTER — Telehealth: Payer: Self-pay | Admitting: Family Medicine

## 2022-08-25 ENCOUNTER — Other Ambulatory Visit: Payer: Self-pay | Admitting: Family Medicine

## 2022-08-25 DIAGNOSIS — I10 Essential (primary) hypertension: Secondary | ICD-10-CM | POA: Diagnosis not present

## 2022-08-25 NOTE — Telephone Encounter (Signed)
Please give the order.  Thanks.   

## 2022-08-25 NOTE — Telephone Encounter (Signed)
Home Health verbal orders Caller Name: Jae Dire Agency Name: [facility]  Callback number: [phone]  Requesting PT  Frequency: 2x a week for 2wks/ 1x a week for 2wks/ 2x a week for 3 wks/ 1x a week for 2 wks  Please forward to [facility] pool or providers CMA

## 2022-08-25 NOTE — Telephone Encounter (Signed)
Home Health verbal orders Caller Name:Jim Agency Name: [facility]  Callback number: [phone]  Requesting OT/PT/Skilled nursing/Social Work/Speech:OT  Reason:  Frequency:1 time week for 9 weeks, also patient needs bariatric 3 in 1 potty chair to [facility], Fax number [phone]  Please forward to [facility] pool or providers CMA

## 2022-08-25 NOTE — Telephone Encounter (Signed)
Verbal orders given to Kate.  

## 2022-08-26 ENCOUNTER — Telehealth: Payer: Self-pay | Admitting: Pulmonary Disease

## 2022-08-26 DIAGNOSIS — I10 Essential (primary) hypertension: Secondary | ICD-10-CM | POA: Diagnosis not present

## 2022-08-26 NOTE — Telephone Encounter (Signed)
Called and updated patient that the order to renew his cpap supplies was already placed by Dr Val Eagle on 08/19/22. I advised him that it just takes a few business days for the order to be processed. I told hm to call his DME company next week if he still has not heard anything from them. Nothing further needed

## 2022-08-26 NOTE — Telephone Encounter (Signed)
Refill request for oxyCODONE-acetaminophen (PERCOCET) 10-325 MG tablet   LOV - 07/07/22 Next OV - not scheduled Last refill - 07/29/22

## 2022-08-26 NOTE — Telephone Encounter (Signed)
Verbal orders left on secure VM

## 2022-08-27 DIAGNOSIS — I10 Essential (primary) hypertension: Secondary | ICD-10-CM | POA: Diagnosis not present

## 2022-08-28 ENCOUNTER — Other Ambulatory Visit: Payer: Self-pay | Admitting: Family Medicine

## 2022-08-28 DIAGNOSIS — I10 Essential (primary) hypertension: Secondary | ICD-10-CM | POA: Diagnosis not present

## 2022-08-28 MED ORDER — OXYCODONE-ACETAMINOPHEN 10-325 MG PO TABS: 1.0000 | ORAL_TABLET | Freq: Four times a day (QID) | ORAL | 0 refills | Status: DC | PRN

## 2022-08-28 NOTE — Telephone Encounter (Signed)
Sent. Thanks.   

## 2022-08-29 DIAGNOSIS — I10 Essential (primary) hypertension: Secondary | ICD-10-CM | POA: Diagnosis not present

## 2022-08-29 NOTE — Telephone Encounter (Signed)
Duplicate request; was sent in yesterday

## 2022-08-29 NOTE — Telephone Encounter (Signed)
Thanks

## 2022-08-30 ENCOUNTER — Encounter: Payer: Self-pay | Admitting: Family Medicine

## 2022-08-30 DIAGNOSIS — I10 Essential (primary) hypertension: Secondary | ICD-10-CM | POA: Diagnosis not present

## 2022-08-31 ENCOUNTER — Telehealth: Payer: Self-pay | Admitting: Family Medicine

## 2022-08-31 DIAGNOSIS — I10 Essential (primary) hypertension: Secondary | ICD-10-CM | POA: Diagnosis not present

## 2022-08-31 NOTE — Telephone Encounter (Signed)
How can we check this patient's urine?  Can he or someone in his family pick up a sterile container for urine sample?  If we can get a urinalysis and set up a virtual visit that may be the best way to go.  This is  assuming he cannot come to clinic.  Thanks.

## 2022-09-01 DIAGNOSIS — I10 Essential (primary) hypertension: Secondary | ICD-10-CM | POA: Diagnosis not present

## 2022-09-01 NOTE — Telephone Encounter (Signed)
Spoke with patient and he will see if he can find anyone that can do this and he is also going to tlak with his [facility] agency and see if they can do it and send the results to [place] for the VV. Patient also stated that he is in need of a recliner lift chair. I advised we will need to do a VV for that too and can do it all at once when he calls back to schedule.

## 2022-09-02 DIAGNOSIS — I10 Essential (primary) hypertension: Secondary | ICD-10-CM | POA: Diagnosis not present

## 2022-09-03 DIAGNOSIS — I10 Essential (primary) hypertension: Secondary | ICD-10-CM | POA: Diagnosis not present

## 2022-09-04 DIAGNOSIS — G4733 Obstructive sleep apnea (adult) (pediatric): Secondary | ICD-10-CM

## 2022-09-04 DIAGNOSIS — Z9181 History of falling: Secondary | ICD-10-CM

## 2022-09-04 DIAGNOSIS — M160 Bilateral primary osteoarthritis of hip: Secondary | ICD-10-CM

## 2022-09-04 DIAGNOSIS — I110 Hypertensive heart disease with heart failure: Secondary | ICD-10-CM

## 2022-09-04 DIAGNOSIS — Z48815 Encounter for surgical aftercare following surgery on the digestive system: Secondary | ICD-10-CM

## 2022-09-04 DIAGNOSIS — K219 Gastro-esophageal reflux disease without esophagitis: Secondary | ICD-10-CM

## 2022-09-04 DIAGNOSIS — M19019 Primary osteoarthritis, unspecified shoulder: Secondary | ICD-10-CM

## 2022-09-04 DIAGNOSIS — I509 Heart failure, unspecified: Secondary | ICD-10-CM

## 2022-09-04 DIAGNOSIS — Z6841 Body Mass Index (BMI) 40.0 and over, adult: Secondary | ICD-10-CM

## 2022-09-04 DIAGNOSIS — Z8701 Personal history of pneumonia (recurrent): Secondary | ICD-10-CM

## 2022-09-04 DIAGNOSIS — Z87891 Personal history of nicotine dependence: Secondary | ICD-10-CM

## 2022-09-04 DIAGNOSIS — R7303 Prediabetes: Secondary | ICD-10-CM

## 2022-09-04 DIAGNOSIS — Z87442 Personal history of urinary calculi: Secondary | ICD-10-CM

## 2022-09-04 DIAGNOSIS — I10 Essential (primary) hypertension: Secondary | ICD-10-CM | POA: Diagnosis not present

## 2022-09-04 DIAGNOSIS — Z7901 Long term (current) use of anticoagulants: Secondary | ICD-10-CM

## 2022-09-04 DIAGNOSIS — D649 Anemia, unspecified: Secondary | ICD-10-CM

## 2022-09-05 ENCOUNTER — Telehealth: Payer: Self-pay | Admitting: Family Medicine

## 2022-09-05 DIAGNOSIS — M160 Bilateral primary osteoarthritis of hip: Secondary | ICD-10-CM

## 2022-09-05 DIAGNOSIS — I10 Essential (primary) hypertension: Secondary | ICD-10-CM | POA: Diagnosis not present

## 2022-09-05 DIAGNOSIS — Z7401 Bed confinement status: Secondary | ICD-10-CM

## 2022-09-05 DIAGNOSIS — I5032 Chronic diastolic (congestive) heart failure: Secondary | ICD-10-CM

## 2022-09-05 NOTE — Telephone Encounter (Signed)
Charlesetta Garibaldi from [facility] called and asked about order for bariatric 3 in 1 commode. Call back number [phone].

## 2022-09-06 DIAGNOSIS — I10 Essential (primary) hypertension: Secondary | ICD-10-CM | POA: Diagnosis not present

## 2022-09-06 NOTE — Telephone Encounter (Signed)
Please give the order.  Thanks.  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

## 2022-09-07 DIAGNOSIS — I10 Essential (primary) hypertension: Secondary | ICD-10-CM | POA: Diagnosis not present

## 2022-09-07 NOTE — Telephone Encounter (Signed)
Patient had a video visit on 12/01. Will close encounter.

## 2022-09-07 NOTE — Addendum Note (Signed)
Addended by: Wendie Simmer B on: 09/07/2022 03:46 PM   Modules accepted: Orders

## 2022-09-07 NOTE — Telephone Encounter (Signed)
DME order has been done and sent to [facility]. Azur at [facility] is aware this was done.

## 2022-09-08 DIAGNOSIS — I10 Essential (primary) hypertension: Secondary | ICD-10-CM | POA: Diagnosis not present

## 2022-09-09 DIAGNOSIS — G4733 Obstructive sleep apnea (adult) (pediatric): Secondary | ICD-10-CM | POA: Diagnosis not present

## 2022-09-09 DIAGNOSIS — I10 Essential (primary) hypertension: Secondary | ICD-10-CM | POA: Diagnosis not present

## 2022-09-10 DIAGNOSIS — I10 Essential (primary) hypertension: Secondary | ICD-10-CM | POA: Diagnosis not present

## 2022-09-11 DIAGNOSIS — I10 Essential (primary) hypertension: Secondary | ICD-10-CM | POA: Diagnosis not present

## 2022-09-13 DIAGNOSIS — I10 Essential (primary) hypertension: Secondary | ICD-10-CM | POA: Diagnosis not present

## 2022-09-14 DIAGNOSIS — I10 Essential (primary) hypertension: Secondary | ICD-10-CM | POA: Diagnosis not present

## 2022-09-15 DIAGNOSIS — I10 Essential (primary) hypertension: Secondary | ICD-10-CM | POA: Diagnosis not present

## 2022-09-16 DIAGNOSIS — I10 Essential (primary) hypertension: Secondary | ICD-10-CM | POA: Diagnosis not present

## 2022-09-17 DIAGNOSIS — I10 Essential (primary) hypertension: Secondary | ICD-10-CM | POA: Diagnosis not present

## 2022-09-18 DIAGNOSIS — I10 Essential (primary) hypertension: Secondary | ICD-10-CM | POA: Diagnosis not present

## 2022-09-19 ENCOUNTER — Other Ambulatory Visit: Payer: Self-pay | Admitting: Family Medicine

## 2022-09-19 DIAGNOSIS — I10 Essential (primary) hypertension: Secondary | ICD-10-CM | POA: Diagnosis not present

## 2022-09-20 DIAGNOSIS — R32 Unspecified urinary incontinence: Secondary | ICD-10-CM | POA: Diagnosis not present

## 2022-09-20 DIAGNOSIS — I10 Essential (primary) hypertension: Secondary | ICD-10-CM | POA: Diagnosis not present

## 2022-09-21 DIAGNOSIS — I10 Essential (primary) hypertension: Secondary | ICD-10-CM | POA: Diagnosis not present

## 2022-09-22 DIAGNOSIS — I10 Essential (primary) hypertension: Secondary | ICD-10-CM | POA: Diagnosis not present

## 2022-09-23 DIAGNOSIS — I10 Essential (primary) hypertension: Secondary | ICD-10-CM | POA: Diagnosis not present

## 2022-09-24 ENCOUNTER — Other Ambulatory Visit: Payer: Self-pay | Admitting: Nurse Practitioner

## 2022-09-24 DIAGNOSIS — H00013 Hordeolum externum right eye, unspecified eyelid: Secondary | ICD-10-CM

## 2022-09-24 DIAGNOSIS — I10 Essential (primary) hypertension: Secondary | ICD-10-CM | POA: Diagnosis not present

## 2022-09-25 DIAGNOSIS — I10 Essential (primary) hypertension: Secondary | ICD-10-CM | POA: Diagnosis not present

## 2022-09-26 DIAGNOSIS — I10 Essential (primary) hypertension: Secondary | ICD-10-CM | POA: Diagnosis not present

## 2022-09-27 ENCOUNTER — Other Ambulatory Visit: Payer: Self-pay | Admitting: Family Medicine

## 2022-09-27 DIAGNOSIS — I10 Essential (primary) hypertension: Secondary | ICD-10-CM | POA: Diagnosis not present

## 2022-09-27 NOTE — Telephone Encounter (Signed)
Refill request for oxyCODONE-acetaminophen (PERCOCET) 10-325 MG tablet    LOV - 07/07/22 Next OV - not scheduled Last refill - 08/28/22 #120 tab/ 0 refill

## 2022-09-28 DIAGNOSIS — I10 Essential (primary) hypertension: Secondary | ICD-10-CM | POA: Diagnosis not present

## 2022-09-28 MED ORDER — OXYCODONE-ACETAMINOPHEN 10-325 MG PO TABS: 1.0000 | ORAL_TABLET | Freq: Four times a day (QID) | ORAL | 0 refills | Status: DC | PRN

## 2022-09-28 NOTE — Telephone Encounter (Signed)
Sent. Thanks.  Please get update on patient re: recent status.

## 2022-09-29 DIAGNOSIS — I10 Essential (primary) hypertension: Secondary | ICD-10-CM | POA: Diagnosis not present

## 2022-09-30 DIAGNOSIS — I10 Essential (primary) hypertension: Secondary | ICD-10-CM | POA: Diagnosis not present

## 2022-10-01 DIAGNOSIS — I10 Essential (primary) hypertension: Secondary | ICD-10-CM | POA: Diagnosis not present

## 2022-10-02 DIAGNOSIS — I10 Essential (primary) hypertension: Secondary | ICD-10-CM | POA: Diagnosis not present

## 2022-10-03 DIAGNOSIS — I10 Essential (primary) hypertension: Secondary | ICD-10-CM | POA: Diagnosis not present

## 2022-10-04 DIAGNOSIS — I10 Essential (primary) hypertension: Secondary | ICD-10-CM | POA: Diagnosis not present

## 2022-10-05 DIAGNOSIS — I10 Essential (primary) hypertension: Secondary | ICD-10-CM | POA: Diagnosis not present

## 2022-10-06 DIAGNOSIS — I10 Essential (primary) hypertension: Secondary | ICD-10-CM | POA: Diagnosis not present

## 2022-10-07 DIAGNOSIS — I10 Essential (primary) hypertension: Secondary | ICD-10-CM | POA: Diagnosis not present

## 2022-10-10 DIAGNOSIS — I10 Essential (primary) hypertension: Secondary | ICD-10-CM | POA: Diagnosis not present

## 2022-10-10 DIAGNOSIS — G4733 Obstructive sleep apnea (adult) (pediatric): Secondary | ICD-10-CM | POA: Diagnosis not present

## 2022-10-11 DIAGNOSIS — I10 Essential (primary) hypertension: Secondary | ICD-10-CM | POA: Diagnosis not present

## 2022-10-13 ENCOUNTER — Ambulatory Visit: Payer: Self-pay

## 2022-10-13 NOTE — Telephone Encounter (Signed)
nfn 

## 2022-10-13 NOTE — Patient Outreach (Signed)
  Care Coordination   Follow Up Visit Note   10/13/2022 Name: Michael Payne MRN: [id number] DOB: 1971/02/22  Michael Payne is a 52 y.o. year old male who sees [name], MD for primary care. I spoke with  Michael Payne by phone today.  What matters to the patients health and wellness today?  Ongoing weight loss and mobility    Goals Addressed             This Visit's Progress    Patient Stated:  Ongoing weight loss       Care Coordination Interventions: Evaluation of current treatment plan related to ongoing weight loss and patient's adherence to plan as established by provider:  Patient states he is doing very well.  He states his weight is down to 440 lbs from 625.  Patient denies any new concerns.  He states he continues to work with physical therapy.  Patient states he is involved with a bariatric weight loss support group that he really enjoys.  Patient reports he is progressing well with his diet/ food options.  Reviewed scheduled/upcoming provider appointments.  Advised patient to contact his primary care provider office and determine when follow up visit is needed.  Reviewed medications and discussed importance of compliance.  Encouraged patient to stay in contact with nutritionist for ongoing support Discussed plans with patient for ongoing care management follow up and provided patient with direct contact information for care management team:  Patient verbally agreed to next follow up telephone outreach with [facility] for 12/13/22.             SDOH assessments and interventions completed:  No     Care Coordination Interventions:  Yes, provided   Follow up plan: Follow up call scheduled for 12/13/22    Encounter Outcome:  Pt. Visit Completed   Quinn Plowman RN,BSN,CCM [place] [phone] direct line

## 2022-10-18 DIAGNOSIS — I10 Essential (primary) hypertension: Secondary | ICD-10-CM | POA: Diagnosis not present

## 2022-10-19 DIAGNOSIS — I10 Essential (primary) hypertension: Secondary | ICD-10-CM | POA: Diagnosis not present

## 2022-10-20 DIAGNOSIS — R32 Unspecified urinary incontinence: Secondary | ICD-10-CM | POA: Diagnosis not present

## 2022-10-20 DIAGNOSIS — I10 Essential (primary) hypertension: Secondary | ICD-10-CM | POA: Diagnosis not present

## 2022-10-21 DIAGNOSIS — I10 Essential (primary) hypertension: Secondary | ICD-10-CM | POA: Diagnosis not present

## 2022-10-24 ENCOUNTER — Telehealth: Payer: Self-pay | Admitting: Family Medicine

## 2022-10-24 NOTE — Telephone Encounter (Signed)
Please give the order.  Thanks.   

## 2022-10-24 NOTE — Telephone Encounter (Signed)
Home Health verbal orders [place] Agency Name: Michael Payne number: [phone]  Requesting OT/PT/Skilled nursing/Social Work/Speech: PT   Reason: strength and balance,walking  Frequency:1 wk 8 wks  Please forward to [facility] pool or providers CMA

## 2022-10-25 DIAGNOSIS — I10 Essential (primary) hypertension: Secondary | ICD-10-CM | POA: Diagnosis not present

## 2022-10-25 NOTE — Telephone Encounter (Signed)
Verbal orders left on secure VM

## 2022-10-26 ENCOUNTER — Other Ambulatory Visit: Payer: Self-pay | Admitting: Nurse Practitioner

## 2022-10-26 DIAGNOSIS — I10 Essential (primary) hypertension: Secondary | ICD-10-CM | POA: Diagnosis not present

## 2022-10-26 DIAGNOSIS — H00013 Hordeolum externum right eye, unspecified eyelid: Secondary | ICD-10-CM

## 2022-10-27 ENCOUNTER — Other Ambulatory Visit: Payer: Self-pay | Admitting: Family Medicine

## 2022-10-27 DIAGNOSIS — I10 Essential (primary) hypertension: Secondary | ICD-10-CM | POA: Diagnosis not present

## 2022-10-31 ENCOUNTER — Telehealth: Payer: Self-pay | Admitting: Family Medicine

## 2022-10-31 DIAGNOSIS — R739 Hyperglycemia, unspecified: Secondary | ICD-10-CM

## 2022-10-31 NOTE — Telephone Encounter (Signed)
Dr Damita Dunnings is requesting for patient to have bloodwork and a urinalysis done. [facility] needs for him to seen the orders over to them so that they cam come out and do them.  Send to [facility] at fax number [phone]

## 2022-10-31 NOTE — Telephone Encounter (Signed)
Refill request for oxyCODONE-acetaminophen (PERCOCET) 10-325 MG tablet   LOV - 07/07/22 Next OV - not scheduled Last refill - 09/28/22 #120/0

## 2022-11-01 DIAGNOSIS — R739 Hyperglycemia, unspecified: Secondary | ICD-10-CM | POA: Insufficient documentation

## 2022-11-01 DIAGNOSIS — I10 Essential (primary) hypertension: Secondary | ICD-10-CM | POA: Diagnosis not present

## 2022-11-01 MED ORDER — OXYCODONE-ACETAMINOPHEN 10-325 MG PO TABS: 1.0000 | ORAL_TABLET | Freq: Four times a day (QID) | ORAL | 0 refills | Status: DC | PRN

## 2022-11-01 NOTE — Telephone Encounter (Signed)
Please send orders for CMET, Lipid panel, CBC, uric acid, TSH, A1c and urine microalbumin.  Dx R73.9, M10.9, E78.1.   Thanks.

## 2022-11-02 DIAGNOSIS — I10 Essential (primary) hypertension: Secondary | ICD-10-CM | POA: Diagnosis not present

## 2022-11-02 NOTE — Telephone Encounter (Signed)
Orders have been faxed to [place]

## 2022-11-03 DIAGNOSIS — I10 Essential (primary) hypertension: Secondary | ICD-10-CM | POA: Diagnosis not present

## 2022-11-04 DIAGNOSIS — I10 Essential (primary) hypertension: Secondary | ICD-10-CM | POA: Diagnosis not present

## 2022-11-05 DIAGNOSIS — I10 Essential (primary) hypertension: Secondary | ICD-10-CM | POA: Diagnosis not present

## 2022-11-06 DIAGNOSIS — I10 Essential (primary) hypertension: Secondary | ICD-10-CM | POA: Diagnosis not present

## 2022-11-07 DIAGNOSIS — I10 Essential (primary) hypertension: Secondary | ICD-10-CM | POA: Diagnosis not present

## 2022-11-08 DIAGNOSIS — I10 Essential (primary) hypertension: Secondary | ICD-10-CM | POA: Diagnosis not present

## 2022-11-09 DIAGNOSIS — I10 Essential (primary) hypertension: Secondary | ICD-10-CM | POA: Diagnosis not present

## 2022-11-10 DIAGNOSIS — I10 Essential (primary) hypertension: Secondary | ICD-10-CM | POA: Diagnosis not present

## 2022-11-10 DIAGNOSIS — G4733 Obstructive sleep apnea (adult) (pediatric): Secondary | ICD-10-CM | POA: Diagnosis not present

## 2022-11-11 DIAGNOSIS — I10 Essential (primary) hypertension: Secondary | ICD-10-CM | POA: Diagnosis not present

## 2022-11-12 DIAGNOSIS — I10 Essential (primary) hypertension: Secondary | ICD-10-CM | POA: Diagnosis not present

## 2022-11-13 DIAGNOSIS — I10 Essential (primary) hypertension: Secondary | ICD-10-CM | POA: Diagnosis not present

## 2022-11-14 DIAGNOSIS — I10 Essential (primary) hypertension: Secondary | ICD-10-CM | POA: Diagnosis not present

## 2022-11-15 ENCOUNTER — Telehealth: Payer: Self-pay | Admitting: Family Medicine

## 2022-11-15 DIAGNOSIS — I10 Essential (primary) hypertension: Secondary | ICD-10-CM | POA: Diagnosis not present

## 2022-11-15 NOTE — Telephone Encounter (Signed)
Amanda from [facility] called to let Michael Payne know when she visit the pt on today, 2/27, the pt's BP was 153/96. Estill Bamberg stated the pt was unsystematic with the elavated BP.

## 2022-11-16 DIAGNOSIS — I10 Essential (primary) hypertension: Secondary | ICD-10-CM | POA: Diagnosis not present

## 2022-11-16 NOTE — Telephone Encounter (Signed)
Please see if they can check BP a few more times to get follow up readings and let me know.  Thanks.

## 2022-11-17 DIAGNOSIS — I10 Essential (primary) hypertension: Secondary | ICD-10-CM | POA: Diagnosis not present

## 2022-11-18 ENCOUNTER — Telehealth: Payer: Self-pay

## 2022-11-18 DIAGNOSIS — I10 Essential (primary) hypertension: Secondary | ICD-10-CM | POA: Diagnosis not present

## 2022-11-18 NOTE — Progress Notes (Signed)
Care Management & Coordination Services Pharmacy Team  Reason for Encounter: Appointment Reminder  Contacted patient to confirm telephone appointment with Charlene Brooke , PharmD on 11/23/22 at 11:45. Spoke with patient on 11/18/2022   Do you have any problems getting your medications? No   What is your top health concern you would like to discuss at your upcoming visit?  No concerns with medications.Patient peased with pharmacy services   BP 11/18/22 134/76,taken by PT  Have you seen any other providers since your last visit with PCP? Yes- video visit pulmonology   Star Rating Drugs:  Medication:  Last Fill: Day Supply No star medications identified  Trulicity '3mg'$  Discontinued by: [name], MD on 07/07/2022   Care Gaps: Annual wellness visit in last year? Yes  If Diabetic: Last eye exam / retinopathy screening:UTD  Last diabetic foot exam:never done    Charlene Brooke, PharmD notified  Avel Sensor, [place] Assistant [phone]

## 2022-11-18 NOTE — Telephone Encounter (Signed)
Called [facility] and they will check BP when they do their home visits and let us know f/u readings.

## 2022-11-19 DIAGNOSIS — I10 Essential (primary) hypertension: Secondary | ICD-10-CM | POA: Diagnosis not present

## 2022-11-20 DIAGNOSIS — G4733 Obstructive sleep apnea (adult) (pediatric): Secondary | ICD-10-CM

## 2022-11-20 DIAGNOSIS — D649 Anemia, unspecified: Secondary | ICD-10-CM

## 2022-11-20 DIAGNOSIS — K219 Gastro-esophageal reflux disease without esophagitis: Secondary | ICD-10-CM

## 2022-11-20 DIAGNOSIS — I10 Essential (primary) hypertension: Secondary | ICD-10-CM | POA: Diagnosis not present

## 2022-11-20 DIAGNOSIS — Z87891 Personal history of nicotine dependence: Secondary | ICD-10-CM

## 2022-11-20 DIAGNOSIS — Z6841 Body Mass Index (BMI) 40.0 and over, adult: Secondary | ICD-10-CM

## 2022-11-20 DIAGNOSIS — R7303 Prediabetes: Secondary | ICD-10-CM | POA: Diagnosis not present

## 2022-11-20 DIAGNOSIS — Z9181 History of falling: Secondary | ICD-10-CM

## 2022-11-20 DIAGNOSIS — Z48815 Encounter for surgical aftercare following surgery on the digestive system: Secondary | ICD-10-CM | POA: Diagnosis not present

## 2022-11-20 DIAGNOSIS — I110 Hypertensive heart disease with heart failure: Secondary | ICD-10-CM | POA: Diagnosis not present

## 2022-11-20 DIAGNOSIS — Z7901 Long term (current) use of anticoagulants: Secondary | ICD-10-CM

## 2022-11-20 DIAGNOSIS — M160 Bilateral primary osteoarthritis of hip: Secondary | ICD-10-CM | POA: Diagnosis not present

## 2022-11-20 DIAGNOSIS — M19019 Primary osteoarthritis, unspecified shoulder: Secondary | ICD-10-CM

## 2022-11-20 DIAGNOSIS — Z8701 Personal history of pneumonia (recurrent): Secondary | ICD-10-CM

## 2022-11-20 DIAGNOSIS — I509 Heart failure, unspecified: Secondary | ICD-10-CM | POA: Diagnosis not present

## 2022-11-21 DIAGNOSIS — I10 Essential (primary) hypertension: Secondary | ICD-10-CM | POA: Diagnosis not present

## 2022-11-22 ENCOUNTER — Encounter: Payer: Self-pay | Admitting: Family Medicine

## 2022-11-22 ENCOUNTER — Telehealth (INDEPENDENT_AMBULATORY_CARE_PROVIDER_SITE_OTHER): Payer: Medicare HMO | Admitting: Family Medicine

## 2022-11-22 VITALS — BP 128/76 | Wt >= 6400 oz

## 2022-11-22 DIAGNOSIS — M160 Bilateral primary osteoarthritis of hip: Secondary | ICD-10-CM

## 2022-11-22 DIAGNOSIS — M109 Gout, unspecified: Secondary | ICD-10-CM | POA: Diagnosis not present

## 2022-11-22 DIAGNOSIS — I10 Essential (primary) hypertension: Secondary | ICD-10-CM

## 2022-11-22 DIAGNOSIS — Z7401 Bed confinement status: Secondary | ICD-10-CM | POA: Diagnosis not present

## 2022-11-22 DIAGNOSIS — I509 Heart failure, unspecified: Secondary | ICD-10-CM | POA: Diagnosis not present

## 2022-11-22 DIAGNOSIS — I110 Hypertensive heart disease with heart failure: Secondary | ICD-10-CM | POA: Diagnosis not present

## 2022-11-22 NOTE — Telephone Encounter (Signed)
Patient stated during his visit today that [facility] never came out to do his bloodwork that was ordered. I advised patient that I would resend orders and to let me know if they do not get these done in the next week or two.

## 2022-11-22 NOTE — Progress Notes (Unsigned)
Virtual visit completed through WebEx or similar program Patient location: home  Provider location: [place] at [facility], office  Participants: Patient and me (unless stated otherwise below)  Limitations and rationale for visit method d/w patient.  Patient agreed to proceed.   CC: follow up.    HPI:  BP controlled today.    Weight down from 625 lbs.  I thanked him for his effort.  He is walking a few steps around the house.  His function status is better than before surgery.  Still working on diet and losing weight.  "I feel great."  He still has significant time in bed and his mattress needs replacement.  He does not yet have skin breakdown but the goal is to prevent that.  More joint pain with weather changes.  Knee and hip pain.  R shoulder is still sore.  He wants to defer tx on hips and knees at this point.  No ADE on pain medication.  Not usually constipated- he can manage with bowel regimen.  Not drowsy.    He is awaiting landlord to help with his steps to make it safer.  He is going to change rooms with his daughter at home, to have access to a different bathroom.   Not lightheaded on standing.  No CP.  Not SOB.  No fevers.  Occ B foot edema.    No gout flares.  Still on allopurinol QOD.    Meds and allergies reviewed.   ROS: Per HPI unless specifically indicated in ROS section   NAD Speech wnl  A/P:  Chronic joint pain.  Discussed continuing Percocet as is for now.  7-monthprescription sent sent.  Not sedated.  He is trying to put off any procedures until he has additional weight loss.  This is reasonable.  He is managing with a bowel regimen.  History of obesity.  Significant weight loss after previous surgery.  History of gout and hypertension. Needs labs drawn.   I am going to check with staff here to see if [facility] can get labs done with orders sent. I told him I would check on a replacement mattress.

## 2022-11-23 ENCOUNTER — Telehealth: Payer: Self-pay | Admitting: Family Medicine

## 2022-11-23 ENCOUNTER — Ambulatory Visit: Payer: Medicare HMO | Admitting: Pharmacist

## 2022-11-23 DIAGNOSIS — I10 Essential (primary) hypertension: Secondary | ICD-10-CM | POA: Diagnosis not present

## 2022-11-23 DIAGNOSIS — Z7401 Bed confinement status: Secondary | ICD-10-CM

## 2022-11-23 MED ORDER — OXYCODONE-ACETAMINOPHEN 10-325 MG PO TABS: 1.0000 | ORAL_TABLET | Freq: Four times a day (QID) | ORAL | 0 refills | Status: DC | PRN

## 2022-11-23 NOTE — Assessment & Plan Note (Signed)
Chronic joint pain.  Discussed continuing Percocet as is for now.  50-monthprescription sent sent.  Not sedated.  He is trying to put off any procedures until he has additional weight loss.  This is reasonable.  He is managing with a bowel regimen.

## 2022-11-23 NOTE — Assessment & Plan Note (Signed)
Will check to see about follow-up labs.

## 2022-11-23 NOTE — Telephone Encounter (Signed)
Lab orders were faxed over again to [facility] yesterday. Will work on mattress order.

## 2022-11-23 NOTE — Telephone Encounter (Signed)
This patient needs labs drawn.   Still homebound.  Can you get the following labs done via [facility]?  Please let me know. CMET, CBC, lipid panel, uric acid, A1c.   Diagnosis R73.9, M10.9, I10.   He also needs an order for replacement mattress.  Please send the order.  Thanks.  Diagnosis Z74.1

## 2022-11-23 NOTE — Assessment & Plan Note (Signed)
No change in medications at this point.  Needs labs drawn.   I am going to check with staff here to see if [facility] can get labs done with orders sent.

## 2022-11-23 NOTE — Assessment & Plan Note (Signed)
See above

## 2022-11-23 NOTE — Progress Notes (Signed)
Care Management & Coordination Services Pharmacy Note  11/23/2022 Name:  Michael Payne MRN:  [id number] DOB:  [birth year]  Summary: F/U visit -HTN / HFpEF: pt endorses compliance with amlodipine, carvedilol; he reports BP 120-130/70s at home, denies dizziness/lightheadedness  Recommendations/Changes made from today's visit: -Discussed importance of BP monitoring in s/o continued wt loss, caution low BP  Follow up plan: -Pharmacist follow up PRN    Subjective: LOEL Michael Payne is an 52 y.o. year old male who is a primary patient of [name], MD.  The care coordination team was consulted for assistance with disease management and care coordination needs.    Engaged with patient by telephone for follow up visit.   Objective:  Lab Results  Component Value Date   CREATININE 0.61 06/23/2022   BUN 11 06/23/2022   GFR 109.18 03/09/2021   EGFR 117 07/26/2021   GFRNONAA >60 06/23/2022   GFRAA >60 01/03/2020   NA 140 06/23/2022   K 3.7 06/23/2022   CALCIUM 8.5 (L) 06/23/2022   CO2 28 06/23/2022   GLUCOSE 104 (H) 06/23/2022    Lab Results  Component Value Date/Time   HGBA1C 5.3 06/03/2022 11:09 AM   HGBA1C 5.9 (H) 06/01/2021 12:00 PM   GFR 109.18 03/09/2021 12:32 PM   GFR 116.29 12/18/2019 05:12 PM    Last diabetic Eye exam:  Lab Results  Component Value Date/Time   HMDIABEYEEXA No Retinopathy 11/22/2021 12:00 AM    Last diabetic Foot exam: No results found for: "HMDIABFOOTEX"   Lab Results  Component Value Date   CHOL 165 03/09/2021   HDL 38.50 (L) 03/09/2021   LDLCALC 101 (H) 03/09/2021   LDLDIRECT 76.0 10/29/2018   TRIG 129.0 03/09/2021   CHOLHDL 4 03/09/2021       Latest Ref Rng & Units 06/03/2022   11:09 AM 06/23/2021    2:26 AM 06/07/2021    4:46 AM  Hepatic Function  Total Protein 6.5 - 8.1 g/dL 8.0  6.9  7.7   Albumin 3.5 - 5.0 g/dL 3.8  3.2  3.1   AST 15 - 41 U/L 20  86  43   ALT 0 - 44 U/L 27  165  53   Alk Phosphatase 38 - 126 U/L 61  44   43   Total Bilirubin 0.3 - 1.2 mg/dL 0.6  0.5  0.7     Lab Results  Component Value Date/Time   TSH 4.33 03/09/2021 12:32 PM   TSH 2.63 12/18/2019 05:12 PM       Latest Ref Rng & Units 06/23/2022    5:02 AM 06/22/2022    3:23 AM 06/21/2022    2:56 AM  CBC  WBC 4.0 - 10.5 K/uL 7.6  9.8  14.1   Hemoglobin 13.0 - 17.0 g/dL 12.1  12.2  13.1   Hematocrit 39.0 - 52.0 % 40.1  40.4  42.8   Platelets 150 - 400 K/uL 215  226  250     Lab Results  Component Value Date/Time   VD25OH 10.08 (L) 12/18/2019 05:12 PM    Clinical ASCVD: No  The 10-year ASCVD risk score (Arnett DK, et al., 2019) is: 17.1%   Values used to calculate the score:     Age: 11 years     Sex: Male     Is Non-Hispanic African American: Yes     Diabetic: Yes     Tobacco smoker: No     Systolic Blood Pressure: 0000000 mmHg  Is BP treated: Yes     HDL Cholesterol: 38.5 mg/dL     Total Cholesterol: 165 mg/dL        11/22/2022   12:39 PM 05/17/2022    8:25 AM 05/15/2021   10:08 AM  Depression screen PHQ 2/9  Decreased Interest 0 0 0  Down, Depressed, Hopeless 0 0 0  PHQ - 2 Score 0 0 0  Altered sleeping 0  0  Tired, decreased energy 0  0  Change in appetite 0  0  Feeling bad or failure about yourself  0  0  Trouble concentrating 0  0  Moving slowly or fidgety/restless 0  0  Suicidal thoughts 0  0  PHQ-9 Score 0  0  Difficult doing work/chores Not difficult at all  Not difficult at all     Social History   Tobacco Use  Smoking Status Former   Packs/day: 2.00   Years: 22.00   Total pack years: 44.00   Types: Cigarettes   Quit date: 02/24/2011   Years since quitting: 11.7  Smokeless Tobacco Never   BP Readings from Last 3 Encounters:  11/22/22 128/76  06/28/22 138/88  12/03/21 (!) 132/91   Pulse Readings from Last 3 Encounters:  06/28/22 81  12/03/21 87  07/26/21 95   Wt Readings from Last 3 Encounters:  11/22/22 (!) 438 lb (198.7 kg)  07/07/22 (!) 485 lb (220 kg)  06/24/22 (!) 492 lb 8.1 oz  (223.4 kg)   BMI Readings from Last 3 Encounters:  11/22/22 53.32 kg/m  07/07/22 59.04 kg/m  06/24/22 59.95 kg/m    Allergies  Allergen Reactions   Lisinopril Shortness Of Breath and Other (See Comments)    Causes cough   Aspirin Other (See Comments)    Gi upset/pain   Lactose Intolerance (Gi) Diarrhea, Nausea And Vomiting and Other (See Comments)    Stomach cramps    Medications Reviewed Today     Reviewed by Charlton Haws, [facility] (Pharmacist) on 11/23/22 at 1223  Med List Status: <None>   Medication Order Taking? Sig Documenting Provider Last Dose Status Informant  allopurinol (ZYLOPRIM) 100 MG tablet [id number] Yes Take 1 tablet (100 mg total) by mouth every other day. [name], MD Taking Active Self, Pharmacy Records  Amino Acids (AMINO ACID PO) [id number] Yes Take 2 Scoops by mouth 2 (two) times a week. [provider] Taking Active Self, Pharmacy Records  amLODipine (NORVASC) 10 MG tablet [id number] Yes TAKE 1 TABLET BY MOUTH EVERY DAY [name], MD Taking Active   carvedilol (COREG) 3.125 MG tablet [id number] Yes TAKE 1 TABLET TWICE DAILY WITH MEALS [name], MD Taking Active   ELDERBERRY PO [id number] Yes Take 2 tablets by mouth daily. [provider] Taking Active Self, Pharmacy Records  Multiple Vitamins-Minerals (MULTIVITAMIN WITH MINERALS) tablet [id number] Yes Take 1 tablet by mouth daily. [provider] Taking Active Self, Pharmacy Records  naloxone [facility]) nasal spray 4 mg/0.1 mL [id number] Yes Spray nostril if needed for opioid overuse- decreased consciousness, decreased respirations  Patient taking differently: Place 0.4 mg into the nose once.   Elby Beck, FNP Taking Active Self, Pharmacy Records  ondansetron (ZOFRAN-ODT) 4 MG disintegrating tablet [id number] Yes Take 1 tablet (4 mg total) by mouth every 6 (six) hours as needed for nausea or vomiting. [name], [name], MD Taking Active    oxyCODONE-acetaminophen (PERCOCET) 10-325 MG tablet [id number] Yes Take 1 tablet by mouth every 6 (six) hours as needed for  pain. [name], MD Taking Active   pantoprazole (PROTONIX) 40 MG tablet [id number] Yes Take 1 tablet (40 mg total) by mouth daily. [name], [name], MD Taking Active   vitamin C (ASCORBIC ACID) 500 MG tablet [id number] Yes Take 500 mg by mouth daily. [provider] Taking Active Self, Pharmacy Records            SDOH:  (Social Determinants of Health) assessments and interventions performed: No SDOH Interventions    Flowsheet Row Clinical Support from 05/17/2022 in [place] at [place] Management from 09/28/2021 in [place] at [place] Management from 07/06/2021 in [place] at [place] from 05/15/2021 in [place] at [place] from 04/06/2021 in [place] at [place] Management from 03/16/2021 in [place] at [place] Interventions Intervention Not Indicated -- -- Intervention Not Indicated -- --  Housing Interventions Intervention Not Indicated -- -- Intervention Not Indicated -- --  Transportation Interventions Intervention Not Indicated Other (Comment)  [coordination for medical EMS transport when needed via [employer] fast streamer request] [employer], Other (Comment) [id number] Referral, [employer] -- --  Depression Interventions/Treatment  -- -- -- PHQ2-9 Score <4 Follow-up Not Indicated PHQ2-9 Score <4 Follow-up Not Indicated --  Financial Strain Interventions Intervention Not Indicated -- -- Intervention Not Indicated -- Intervention Not Indicated  Physical Activity Interventions Intervention Not Indicated Other (Comments)  [receiving HH PT and OT] -- Patient  Refused -- --  Stress Interventions Intervention Not Indicated -- -- Intervention Not Indicated -- --  Social Connections Interventions Intervention Not Indicated -- -- Intervention Not Indicated -- --       Medication Assistance: None required.  Patient affirms current coverage meets needs.  Medication Access: Within the past 30 days, how often has patient missed a dose of medication? 0 Is a pillbox or other method used to improve adherence? Yes  Factors that may affect medication adherence? no barriers identified Are meds synced by current pharmacy? No  Are meds delivered by current pharmacy? Yes  Does patient experience delays in picking up medications due to transportation concerns? No   [facility] Services Reviewed: Is patient disadvantaged to use [facility]?: Yes  Current Rx insurance plan: [employer] Name and location of [facility]  [id number]Phone: [phone]: [phone] [place]Phone: [phone]: [phone] [facility] services reviewed with patient today?: No  Patient requests to transfer care to [facility]?: No  Reason patient declined to change pharmacies: Disadvantaged due to insurance/mail order  Compliance/Adherence/Medication fill history: Care Gaps: Foot exam   Star-Rating Drugs: None   ASSESSMENT / PLAN  Heart Failure / Hypertension (BP goal <130/80) -Controlled - per pt report; he is not taking furosemide any longer; he reports BP at home 120-130/70s, denies s/sx hypotension -HF type: HFpEF (EF > 50%) -NYHA Class: I (no actitivty limitation) -Current treatment: Amlodipine 10 mg daily - Appropriate, Effective, Safe, Accessible Carvedilol 3.125 mg BID -Appropriate, Effective, Safe, Accessible -Medications  previously tried: furosemide, Kcl -Discussed importance of BP monitoring in s/o continued wt loss, caution low BP -Continue to monitor BP  at home -Recommended to continue current medication  Diabetes/Weight Loss (A1c goal <7%) -Controlled - A1c 5.3% (05/2022) at goal;  -Pt has lost nearly 200 lbs (625 lb > 438 lb), following with bariatric weight loss  -Current medications:None -Medications previously tried: Trulicity -Denies hypoglycemic/hyperglycemic symptoms -Current exercise: bed bound -Educated on A1c and blood sugar goals; Discussed increasing Trulicity dose. -Recommend to continue current medication   Charlene Brooke, PharmD, BCACP Clinical Pharmacist [facility] at [facility] [phone]

## 2022-11-24 DIAGNOSIS — I10 Essential (primary) hypertension: Secondary | ICD-10-CM | POA: Diagnosis not present

## 2022-11-24 NOTE — Telephone Encounter (Signed)
Order for mattress has been placed and message sent to [facility] team.

## 2022-11-24 NOTE — Addendum Note (Signed)
Addended by: Sherrilee Gilles B on: 11/24/2022 09:51 AM   Modules accepted: Orders

## 2022-11-25 DIAGNOSIS — I10 Essential (primary) hypertension: Secondary | ICD-10-CM | POA: Diagnosis not present

## 2022-11-26 DIAGNOSIS — I10 Essential (primary) hypertension: Secondary | ICD-10-CM | POA: Diagnosis not present

## 2022-11-27 DIAGNOSIS — I10 Essential (primary) hypertension: Secondary | ICD-10-CM | POA: Diagnosis not present

## 2022-11-28 DIAGNOSIS — I10 Essential (primary) hypertension: Secondary | ICD-10-CM | POA: Diagnosis not present

## 2022-11-29 ENCOUNTER — Other Ambulatory Visit: Payer: Self-pay | Admitting: Family Medicine

## 2022-11-30 DIAGNOSIS — I10 Essential (primary) hypertension: Secondary | ICD-10-CM | POA: Diagnosis not present

## 2022-12-01 DIAGNOSIS — I10 Essential (primary) hypertension: Secondary | ICD-10-CM | POA: Diagnosis not present

## 2022-12-02 DIAGNOSIS — I10 Essential (primary) hypertension: Secondary | ICD-10-CM | POA: Diagnosis not present

## 2022-12-03 DIAGNOSIS — I10 Essential (primary) hypertension: Secondary | ICD-10-CM | POA: Diagnosis not present

## 2022-12-04 DIAGNOSIS — I10 Essential (primary) hypertension: Secondary | ICD-10-CM | POA: Diagnosis not present

## 2022-12-05 DIAGNOSIS — I10 Essential (primary) hypertension: Secondary | ICD-10-CM | POA: Diagnosis not present

## 2022-12-06 DIAGNOSIS — I10 Essential (primary) hypertension: Secondary | ICD-10-CM | POA: Diagnosis not present

## 2022-12-07 ENCOUNTER — Encounter: Payer: Self-pay | Admitting: Family Medicine

## 2022-12-07 DIAGNOSIS — M160 Bilateral primary osteoarthritis of hip: Secondary | ICD-10-CM

## 2022-12-07 DIAGNOSIS — I10 Essential (primary) hypertension: Secondary | ICD-10-CM | POA: Diagnosis not present

## 2022-12-07 DIAGNOSIS — I5032 Chronic diastolic (congestive) heart failure: Secondary | ICD-10-CM

## 2022-12-08 DIAGNOSIS — I10 Essential (primary) hypertension: Secondary | ICD-10-CM | POA: Diagnosis not present

## 2022-12-09 DIAGNOSIS — I10 Essential (primary) hypertension: Secondary | ICD-10-CM | POA: Diagnosis not present

## 2022-12-09 DIAGNOSIS — G4733 Obstructive sleep apnea (adult) (pediatric): Secondary | ICD-10-CM | POA: Diagnosis not present

## 2022-12-10 DIAGNOSIS — I10 Essential (primary) hypertension: Secondary | ICD-10-CM | POA: Diagnosis not present

## 2022-12-11 DIAGNOSIS — I10 Essential (primary) hypertension: Secondary | ICD-10-CM | POA: Diagnosis not present

## 2022-12-12 DIAGNOSIS — I10 Essential (primary) hypertension: Secondary | ICD-10-CM | POA: Diagnosis not present

## 2022-12-13 ENCOUNTER — Ambulatory Visit: Payer: Self-pay

## 2022-12-13 DIAGNOSIS — I10 Essential (primary) hypertension: Secondary | ICD-10-CM | POA: Diagnosis not present

## 2022-12-13 NOTE — Patient Outreach (Signed)
  Care Coordination   12/13/2022 Name: Michael Payne MRN: [id number] DOB: 1971-02-14   Care Coordination Outreach Attempts:  An unsuccessful telephone outreach was attempted for a scheduled appointment today.  Follow Up Plan:  Additional outreach attempts will be made to offer the patient care coordination information and services.   Encounter Outcome:  No Answer   Care Coordination Interventions:  No, not indicated    Quinn Plowman [facility] [place] [phone] direct line

## 2022-12-14 DIAGNOSIS — G4733 Obstructive sleep apnea (adult) (pediatric): Secondary | ICD-10-CM | POA: Diagnosis not present

## 2022-12-14 DIAGNOSIS — I10 Essential (primary) hypertension: Secondary | ICD-10-CM | POA: Diagnosis not present

## 2022-12-15 DIAGNOSIS — I10 Essential (primary) hypertension: Secondary | ICD-10-CM | POA: Diagnosis not present

## 2022-12-16 DIAGNOSIS — R32 Unspecified urinary incontinence: Secondary | ICD-10-CM | POA: Diagnosis not present

## 2022-12-16 DIAGNOSIS — I10 Essential (primary) hypertension: Secondary | ICD-10-CM | POA: Diagnosis not present

## 2022-12-17 DIAGNOSIS — I10 Essential (primary) hypertension: Secondary | ICD-10-CM | POA: Diagnosis not present

## 2022-12-18 DIAGNOSIS — I10 Essential (primary) hypertension: Secondary | ICD-10-CM | POA: Diagnosis not present

## 2022-12-19 DIAGNOSIS — I10 Essential (primary) hypertension: Secondary | ICD-10-CM | POA: Diagnosis not present

## 2022-12-20 DIAGNOSIS — I10 Essential (primary) hypertension: Secondary | ICD-10-CM | POA: Diagnosis not present

## 2022-12-21 DIAGNOSIS — I10 Essential (primary) hypertension: Secondary | ICD-10-CM | POA: Diagnosis not present

## 2022-12-22 ENCOUNTER — Telehealth: Payer: Self-pay | Admitting: Family Medicine

## 2022-12-22 ENCOUNTER — Ambulatory Visit: Payer: Self-pay

## 2022-12-22 DIAGNOSIS — I10 Essential (primary) hypertension: Secondary | ICD-10-CM | POA: Diagnosis not present

## 2022-12-22 NOTE — Patient Outreach (Signed)
  Care Coordination   Follow Up Visit Note   12/22/2022 Name: ADALBERT DUMITRESCU MRN: [id number] DOB: [birth year]  [name] is a 52 y.o. year old male who sees [name], MD for primary care. I spoke with  [name] by phone today.  What matters to the patients health and wellness today?  Patient states he is doing well.  He states his physical therapy is scheduled to end this week but states he is requesting an appeal.  Patient reports his leg/knee pain is a 5 on 10 point pain scale. Patient reports pain medication is manageable with current pain medication regimen.  Patient states he has been able to ambulate outside and down small flight of stairs.     Goals Addressed             This Visit's Progress    Patient Stated:  Ongoing weight loss / management of osteoarthritis       Interventions Today    Flowsheet Row Most Recent Value  Chronic Disease   Chronic disease during today's visit Other  [Chronic joint pain/osteoarthritis, obesity]  General Interventions   General Interventions Discussed/Reviewed General Interventions Reviewed, Doctor Visits  [evaluation of current treatment plan related to osteoarthritis/ obesity and patients adherence to treament plan as recommended by provider.  Assessed patients pain level. Assessed for ongoing physical therapy]  Doctor Visits Discussed/Reviewed Doctor Visits Reviewed  [reviewed scheduled/ upcoming provider visit. Discussed   ongoing follow up with RNCM for care coordination]  Exercise Interventions   Exercise Discussed/Reviewed Physical Activity  Physical Activity Discussed/Reviewed Physical Activity Reviewed  [Encouraged ongoing physical activity daily]  Pharmacy Interventions   Pharmacy Dicussed/Reviewed Pharmacy Topics Reviewed  [medications reviewed and discussed]                 SDOH assessments and interventions completed:  No     Care Coordination Interventions:  Yes, provided   Follow up plan:  Follow up call scheduled for 02/27/23    Encounter Outcome:  Pt. Visit Completed   Quinn Plowman RN,BSN,CCM [place] [phone] direct line

## 2022-12-22 NOTE — Telephone Encounter (Signed)
Verbal orders have been given to [facility] at [facility]

## 2022-12-22 NOTE — Telephone Encounter (Signed)
Called insurance not able to get peer to peer rescheduled due to end date. Was advised to call to start appeal. Given number [phone] ext [phone].   I have called that number and spoke to Seth Bake she has started expedited appeal. Should have answer in 24-48 hours  Reference number [id number]

## 2022-12-22 NOTE — Telephone Encounter (Signed)
Patient called in and stated that his insurance denied his therapy. He stated that he needs a n expedited appeal to be done by Dr. Damita Dunnings and sent to his insurance so he can continue with therapy. He said if you have any questions to give him a call. Thank you!

## 2022-12-22 NOTE — Telephone Encounter (Signed)
Home Health verbal orders [place] Agency Name: Felicie Morn  Callback number: [phone]  Requesting OT/PT/Skilled nursing/Social Work/Speech: PT  Reason: Delene Ruffini  Frequency:1 [place] 9 WKS  Please forward to [facility] pool or providers CMA

## 2022-12-22 NOTE — Telephone Encounter (Signed)
Received fax from [facility] about this yesterday but date for peer to peer has already expired. Will call to see if it can still be done or if another PA needs to be done.

## 2022-12-22 NOTE — Telephone Encounter (Signed)
Please give the order.  Thanks.   

## 2022-12-23 DIAGNOSIS — I10 Essential (primary) hypertension: Secondary | ICD-10-CM | POA: Diagnosis not present

## 2022-12-24 DIAGNOSIS — I10 Essential (primary) hypertension: Secondary | ICD-10-CM | POA: Diagnosis not present

## 2022-12-25 DIAGNOSIS — I10 Essential (primary) hypertension: Secondary | ICD-10-CM | POA: Diagnosis not present

## 2022-12-26 DIAGNOSIS — I10 Essential (primary) hypertension: Secondary | ICD-10-CM | POA: Diagnosis not present

## 2022-12-27 DIAGNOSIS — I10 Essential (primary) hypertension: Secondary | ICD-10-CM | POA: Diagnosis not present

## 2022-12-28 DIAGNOSIS — I10 Essential (primary) hypertension: Secondary | ICD-10-CM | POA: Diagnosis not present

## 2022-12-29 DIAGNOSIS — I10 Essential (primary) hypertension: Secondary | ICD-10-CM | POA: Diagnosis not present

## 2022-12-30 DIAGNOSIS — I10 Essential (primary) hypertension: Secondary | ICD-10-CM | POA: Diagnosis not present

## 2022-12-31 DIAGNOSIS — I10 Essential (primary) hypertension: Secondary | ICD-10-CM | POA: Diagnosis not present

## 2023-01-01 DIAGNOSIS — I10 Essential (primary) hypertension: Secondary | ICD-10-CM | POA: Diagnosis not present

## 2023-01-01 NOTE — Telephone Encounter (Signed)
Please send the order. Diagnosis: [I50.9]  [Z74.01] [E66.01] [M19.90]

## 2023-01-02 DIAGNOSIS — I10 Essential (primary) hypertension: Secondary | ICD-10-CM | POA: Diagnosis not present

## 2023-01-02 NOTE — Addendum Note (Signed)
Addended by: Wendie Simmer B on: 01/02/2023 09:46 AM   Modules accepted: Orders

## 2023-01-03 DIAGNOSIS — I10 Essential (primary) hypertension: Secondary | ICD-10-CM | POA: Diagnosis not present

## 2023-01-03 NOTE — Telephone Encounter (Signed)
Appeal has been approved from 12/27/22 to 02/24/23

## 2023-01-04 DIAGNOSIS — I10 Essential (primary) hypertension: Secondary | ICD-10-CM | POA: Diagnosis not present

## 2023-01-05 ENCOUNTER — Telehealth: Payer: Self-pay | Admitting: Family Medicine

## 2023-01-05 DIAGNOSIS — G4733 Obstructive sleep apnea (adult) (pediatric): Secondary | ICD-10-CM | POA: Diagnosis not present

## 2023-01-05 DIAGNOSIS — I10 Essential (primary) hypertension: Secondary | ICD-10-CM | POA: Diagnosis not present

## 2023-01-05 DIAGNOSIS — I509 Heart failure, unspecified: Secondary | ICD-10-CM | POA: Diagnosis not present

## 2023-01-05 NOTE — Telephone Encounter (Signed)
Message below worked for patient walker order.

## 2023-01-05 NOTE — Telephone Encounter (Signed)
Message has been given to [facility] staff. Waiting on response on if this is okay.

## 2023-01-05 NOTE — Telephone Encounter (Signed)
Please send the following message:  This patient has a history of obesity, heart failure and arthritis.  He is unable to walk without a bariatric rollator walker.  Please provide him with one.    Diagnosis: [I50.9]  [E66.01] [M19.90]

## 2023-01-06 DIAGNOSIS — I10 Essential (primary) hypertension: Secondary | ICD-10-CM | POA: Diagnosis not present

## 2023-01-07 DIAGNOSIS — I10 Essential (primary) hypertension: Secondary | ICD-10-CM | POA: Diagnosis not present

## 2023-01-08 DIAGNOSIS — I10 Essential (primary) hypertension: Secondary | ICD-10-CM | POA: Diagnosis not present

## 2023-01-09 DIAGNOSIS — I10 Essential (primary) hypertension: Secondary | ICD-10-CM | POA: Diagnosis not present

## 2023-01-10 DIAGNOSIS — I10 Essential (primary) hypertension: Secondary | ICD-10-CM | POA: Diagnosis not present

## 2023-01-11 DIAGNOSIS — I10 Essential (primary) hypertension: Secondary | ICD-10-CM | POA: Diagnosis not present

## 2023-01-12 DIAGNOSIS — I10 Essential (primary) hypertension: Secondary | ICD-10-CM | POA: Diagnosis not present

## 2023-01-13 DIAGNOSIS — I10 Essential (primary) hypertension: Secondary | ICD-10-CM | POA: Diagnosis not present

## 2023-01-14 DIAGNOSIS — I10 Essential (primary) hypertension: Secondary | ICD-10-CM | POA: Diagnosis not present

## 2023-01-16 DIAGNOSIS — I10 Essential (primary) hypertension: Secondary | ICD-10-CM | POA: Diagnosis not present

## 2023-01-17 DIAGNOSIS — I10 Essential (primary) hypertension: Secondary | ICD-10-CM | POA: Diagnosis not present

## 2023-01-18 DIAGNOSIS — I10 Essential (primary) hypertension: Secondary | ICD-10-CM | POA: Diagnosis not present

## 2023-01-19 DIAGNOSIS — I10 Essential (primary) hypertension: Secondary | ICD-10-CM | POA: Diagnosis not present

## 2023-01-20 ENCOUNTER — Telehealth: Payer: Self-pay | Admitting: Family Medicine

## 2023-01-20 DIAGNOSIS — I10 Essential (primary) hypertension: Secondary | ICD-10-CM | POA: Diagnosis not present

## 2023-01-20 NOTE — Telephone Encounter (Signed)
Duly noted, I need to see about options and we'll be in touch.  Thanks.

## 2023-01-20 NOTE — Telephone Encounter (Signed)
Please triage patient about his pain.  See what detail you can get about med use/effect/etc.  Thanks.

## 2023-01-20 NOTE — Telephone Encounter (Signed)
I spoke with pt; pt said that he has been bedridden for years and now pt is able to be up and moving around with walker. Pt said he has pain in both knees and right hip which has worsened since up walking around. Now pts pain level is 6 and pt last took oxycodone apap 10-325 mg around 10 AM this morning. Pt is taking oxycodone apap 10-325 mg every 6 hours which helps the pain and makes it bareable but does not completely make pain go away. Pt has pain med but taking just the one q6h is not working to take away pts pain since he is up and moving around now with walker.[facility] [address] Pt said he has previously tried gabapentin and another long acting pain med (pt cannot remember name of med pt said has been long time since took that medication. )and  those 2 meds did not help pain at all. pt request cb after Dr Para March reviews this note to see what Dr Para March wants pt to take.

## 2023-01-20 NOTE — Telephone Encounter (Signed)
Called patient and advised Dr. Para March is working on this and will get back with him. Patient stated we can mychart if needed over the weekend.

## 2023-01-21 ENCOUNTER — Other Ambulatory Visit: Payer: Self-pay | Admitting: Family Medicine

## 2023-01-21 DIAGNOSIS — I10 Essential (primary) hypertension: Secondary | ICD-10-CM | POA: Diagnosis not present

## 2023-01-22 MED ORDER — MELOXICAM 7.5 MG PO TABS: 7.5000 mg | ORAL_TABLET | Freq: Every day | ORAL | 0 refills | Status: DC

## 2023-01-22 NOTE — Addendum Note (Signed)
Addended by: Joaquim Nam on: 01/22/2023 09:24 PM   Modules accepted: Orders

## 2023-01-23 DIAGNOSIS — I10 Essential (primary) hypertension: Secondary | ICD-10-CM | POA: Diagnosis not present

## 2023-01-24 DIAGNOSIS — I10 Essential (primary) hypertension: Secondary | ICD-10-CM | POA: Diagnosis not present

## 2023-01-25 DIAGNOSIS — I10 Essential (primary) hypertension: Secondary | ICD-10-CM | POA: Diagnosis not present

## 2023-01-26 ENCOUNTER — Ambulatory Visit: Payer: Self-pay

## 2023-01-26 ENCOUNTER — Ambulatory Visit (INDEPENDENT_AMBULATORY_CARE_PROVIDER_SITE_OTHER): Payer: Medicare HMO | Admitting: Family Medicine

## 2023-01-26 ENCOUNTER — Encounter: Payer: Self-pay | Admitting: Family Medicine

## 2023-01-26 VITALS — BP 140/86 | HR 103 | Temp 96.2°F | Ht 76.0 in | Wt >= 6400 oz

## 2023-01-26 DIAGNOSIS — M109 Gout, unspecified: Secondary | ICD-10-CM | POA: Diagnosis not present

## 2023-01-26 DIAGNOSIS — G4733 Obstructive sleep apnea (adult) (pediatric): Secondary | ICD-10-CM | POA: Diagnosis not present

## 2023-01-26 DIAGNOSIS — Z125 Encounter for screening for malignant neoplasm of prostate: Secondary | ICD-10-CM

## 2023-01-26 DIAGNOSIS — I509 Heart failure, unspecified: Secondary | ICD-10-CM | POA: Diagnosis not present

## 2023-01-26 DIAGNOSIS — Z7189 Other specified counseling: Secondary | ICD-10-CM | POA: Diagnosis not present

## 2023-01-26 DIAGNOSIS — I10 Essential (primary) hypertension: Secondary | ICD-10-CM

## 2023-01-26 DIAGNOSIS — L989 Disorder of the skin and subcutaneous tissue, unspecified: Secondary | ICD-10-CM | POA: Diagnosis not present

## 2023-01-26 DIAGNOSIS — G894 Chronic pain syndrome: Secondary | ICD-10-CM | POA: Diagnosis not present

## 2023-01-26 DIAGNOSIS — R739 Hyperglycemia, unspecified: Secondary | ICD-10-CM

## 2023-01-26 DIAGNOSIS — I110 Hypertensive heart disease with heart failure: Secondary | ICD-10-CM | POA: Diagnosis not present

## 2023-01-26 DIAGNOSIS — Z6841 Body Mass Index (BMI) 40.0 and over, adult: Secondary | ICD-10-CM | POA: Diagnosis not present

## 2023-01-26 LAB — LIPID PANEL
Cholesterol: 140 mg/dL (ref 0–200)
HDL: 44.2 mg/dL (ref >39.00)
LDL Cholesterol: 78 mg/dL (ref 0–99)
NonHDL: 95.68
Total CHOL/HDL Ratio: 3
Triglycerides: 86 mg/dL (ref 0.0–149.0)
VLDL: 17.2 mg/dL (ref 0.0–40.0)

## 2023-01-26 LAB — COMPREHENSIVE METABOLIC PANEL
ALT: 16 U/L (ref 0–53)
AST: 15 U/L (ref 0–37)
Albumin: 4.1 g/dL (ref 3.5–5.2)
Alkaline Phosphatase: 72 U/L (ref 39–117)
BUN: 15 mg/dL (ref 6–23)
CO2: 29 mEq/L (ref 19–32)
Calcium: 9.2 mg/dL (ref 8.4–10.5)
Chloride: 103 mEq/L (ref 96–112)
Creatinine, Ser: 0.82 mg/dL (ref 0.40–1.50)
GFR: 101.82 mL/min (ref >60.00)
Glucose, Bld: 95 mg/dL (ref 70–99)
Potassium: 4.4 mEq/L (ref 3.5–5.1)
Sodium: 139 mEq/L (ref 135–145)
Total Bilirubin: 0.5 mg/dL (ref 0.2–1.2)
Total Protein: 7.7 g/dL (ref 6.0–8.3)

## 2023-01-26 LAB — CBC WITH DIFFERENTIAL/PLATELET
Basophils Absolute: 0.1 10*3/uL (ref 0.0–0.1)
Basophils Relative: 1 % (ref 0.0–3.0)
Eosinophils Absolute: 0.1 10*3/uL (ref 0.0–0.7)
Eosinophils Relative: 2.4 % (ref 0.0–5.0)
HCT: 43.2 % (ref 39.0–52.0)
Hemoglobin: 14.3 g/dL (ref 13.0–17.0)
Lymphocytes Relative: 30.6 % (ref 12.0–46.0)
Lymphs Abs: 1.8 10*3/uL (ref 0.7–4.0)
MCHC: 33.1 g/dL (ref 30.0–36.0)
MCV: 91.1 fl (ref 78.0–100.0)
Monocytes Absolute: 0.5 10*3/uL (ref 0.1–1.0)
Monocytes Relative: 8.4 % (ref 3.0–12.0)
Neutro Abs: 3.4 10*3/uL (ref 1.4–7.7)
Neutrophils Relative %: 57.6 % (ref 43.0–77.0)
Platelets: 230 10*3/uL (ref 150.0–400.0)
RBC: 4.75 Mil/uL (ref 4.22–5.81)
RDW: 13.9 % (ref 11.5–15.5)
WBC: 5.9 10*3/uL (ref 4.0–10.5)

## 2023-01-26 LAB — PSA: PSA: 0.4 ng/mL (ref 0.10–4.00)

## 2023-01-26 LAB — TSH: TSH: 1.99 u[IU]/mL (ref 0.35–5.50)

## 2023-01-26 LAB — HEMOGLOBIN A1C: Hgb A1c MFr Bld: 5.4 % (ref 4.6–6.5)

## 2023-01-26 LAB — VITAMIN B12: Vitamin B-12: 579 pg/mL (ref 211–911)

## 2023-01-26 LAB — URIC ACID: Uric Acid, Serum: 6.1 mg/dL (ref 4.0–7.8)

## 2023-01-26 MED ORDER — OXYCODONE-ACETAMINOPHEN 10-325 MG PO TABS: 1.0000 | ORAL_TABLET | Freq: Four times a day (QID) | ORAL | 0 refills | Status: DC | PRN

## 2023-01-26 MED ORDER — ALLOPURINOL 100 MG PO TABS: 100.0000 mg | ORAL_TABLET | ORAL | Status: DC

## 2023-01-26 NOTE — Progress Notes (Signed)
Chronic pain follow up.  D/w pt about oxycodone and meloxicam use.  Taking 40mg  oxycodone for hip pain at baseline.  He is more mobile in the meantime due to weight loss, was able to come to clinic.  He is trying to put up with knee shoulder and hip pain until he can get his weight lower to see about options.  D/w pt about lift chair- this is a reasonable option.  D/w pt about exercise at home.  Using walker in the meantime.  See following phone note.  Daughter Angeline designated if patient were incapacitated.    Mood is good.  He is happy about his weight loss progress so far.  H/o DM but not currently.   No meds.   Hypoglycemic episodes: no sx  Hyperglycemic episodes: no sx Feet problems: no Blood Sugars averaging: not checked.    Hypertension:    Using medication without problems or lightheadedness: yes Chest pain with exertion:no Edema:no Short of breath:no  PSA pending, d/w pt about screening.    Still using CPAP.  Compliant.   Weight down from 625 down to 442.    Gout.  Allopurinol MWF.  Rare sx.  Labs pending.  Compliant with med.    PMH and SH reviewed  Meds, vitals, and allergies reviewed.   ROS: Per HPI unless specifically indicated in ROS section   GEN: nad, alert and oriented HEENT: mucous membranes moist NECK: supple w/o LA CV: rrr. PULM: ctab, no inc wob ABD: soft, +bs EXT: no edema SKIN: no acute rash but seb cyst vs hidradenitis suppurativa noted on the back of the neck.  Without tenderness or discharge or spreading erythema.

## 2023-01-26 NOTE — Patient Outreach (Signed)
  Care Coordination   Follow Up Visit Note   01/26/2023 Name: Michael Payne MRN: [id number] DOB: 1970/10/03  Michael Payne is a 52 y.o. year old male who sees Joaquim Nam, MD for primary care. I spoke with  Michael Payne in the primary care provider office today.  What matters to the patients health and wellness today?  Patient states he is doing exceptional.  Patient was able to walk into the PCP office for the first time in well over 1 year. He reports ongoing knee pain. Continues to use walker for ambulating.  Patient states overall he feels better.  Patients in office blood pressure reading was 140/83.        Goals Addressed             This Visit's Progress    Patient Stated:  Ongoing weight loss / management of osteoarthritis       Interventions Today    Flowsheet Row Most Recent Value  Chronic Disease   Chronic disease during today's visit Hypertension (HTN), Other  [Chronic joint pain/ osteoarthritis]  General Interventions   General Interventions Discussed/Reviewed General Interventions Reviewed, Doctor Visits  [evaluation of current treatment plan for HTN/ osteoarthritis and patients ahderence to plan as established by provider.  Assessed pain level.  Assessed for in office blood pressure reading.]  Doctor Visits Discussed/Reviewed Doctor Visits Reviewed  [reviewed scheduled/ upcoming provider]  Exercise Interventions   Exercise Discussed/Reviewed Physical Activity  Physical Activity Discussed/Reviewed Physical Activity Reviewed  [Congratulated patient on his great strides with weight loss and increase physical activity]  Pharmacy Interventions   Pharmacy Dicussed/Reviewed Pharmacy Topics Reviewed  [medications reviewed  and compliance discussed]     Advised to continue monitoring blood pressure daily and recording.             SDOH assessments and interventions completed:  No     Care Coordination Interventions:  Yes, provided   Follow up  plan: Follow up call scheduled for 02/27/23    Encounter Outcome:  Pt. Visit Completed   George Ina RN,BSN,CCM [facility] Care Coordination [phone] direct line

## 2023-01-26 NOTE — Patient Instructions (Signed)
Go to the lab on the way out.   If you have mychart we'll likely use that to update you.    Take care.  Glad to see you. Try the higher dose of oxycodone if needed.  Let me know how that goes.

## 2023-01-27 ENCOUNTER — Telehealth: Payer: Self-pay

## 2023-01-27 DIAGNOSIS — I10 Essential (primary) hypertension: Secondary | ICD-10-CM | POA: Diagnosis not present

## 2023-01-27 NOTE — Patient Outreach (Signed)
  Care Coordination   Follow Up Visit Note   01/27/2023 Name: Michael Payne MRN: [id number] DOB: May 30, 1971  Michael Payne is a 52 y.o. year old male who sees Joaquim Nam, MD for primary care. I spoke with  Michael Payne by phone today.Returned call to patient.   What matters to the patients health and wellness today?   Patient states he was looking on Mychart at his lab work completed on 01/26/23 and was concerned about abnormal lab values.   Patient reassured that lab work completed on 01/26/23 appeared to be within normal range. Advised patient to discuss with primary care provider for further concerns or questions.   Patient states he realized when looking at the lab work he was looking at previous result date instead of lab date on 01/26/23.     Goals Addressed             This Visit's Progress    Patient Stated:  Ongoing weight loss / management of osteoarthritis       Interventions Today    Flowsheet Row Most Recent Value  Chronic Disease   Chronic disease during today's visit Hypertension (HTN), Other  [Osteoarthritis]  General Interventions   General Interventions Discussed/Reviewed General Interventions Reviewed, Labs  Labs --  [Lab work reviewed/ discussed with patient.]                 SDOH assessments and interventions completed:  No     Care Coordination Interventions:  Yes, provided   Follow up plan: Follow up call scheduled for as previously scheduled     Encounter Outcome:  Pt. Visit Completed   George Ina RN,BSN,CCM [facility] Care Coordination [phone] direct line

## 2023-01-28 DIAGNOSIS — I10 Essential (primary) hypertension: Secondary | ICD-10-CM | POA: Diagnosis not present

## 2023-01-29 ENCOUNTER — Telehealth: Payer: Self-pay | Admitting: Family Medicine

## 2023-01-29 DIAGNOSIS — I10 Essential (primary) hypertension: Secondary | ICD-10-CM | POA: Diagnosis not present

## 2023-01-29 DIAGNOSIS — L989 Disorder of the skin and subcutaneous tissue, unspecified: Secondary | ICD-10-CM | POA: Insufficient documentation

## 2023-01-29 DIAGNOSIS — Z7189 Other specified counseling: Secondary | ICD-10-CM | POA: Insufficient documentation

## 2023-01-29 DIAGNOSIS — I5032 Chronic diastolic (congestive) heart failure: Secondary | ICD-10-CM

## 2023-01-29 DIAGNOSIS — M160 Bilateral primary osteoarthritis of hip: Secondary | ICD-10-CM

## 2023-01-29 DIAGNOSIS — G8929 Other chronic pain: Secondary | ICD-10-CM | POA: Insufficient documentation

## 2023-01-29 NOTE — Assessment & Plan Note (Signed)
Daughter Angeline designated if patient were incapacitated.

## 2023-01-29 NOTE — Assessment & Plan Note (Signed)
Recheck blood pressure reasonable for now.  Continue work on weight reduction.  Continue carvedilol.  Continue amlodipine.

## 2023-01-29 NOTE — Assessment & Plan Note (Signed)
Still using CPAP.  Compliant.   Weight down from 625 down to 442.  Continue as is.

## 2023-01-29 NOTE — Assessment & Plan Note (Signed)
Hyperglycemia.  History of diabetes but not currently.  Not on medications.  Discussed diet and weight loss goals.  See notes on labs.

## 2023-01-29 NOTE — Telephone Encounter (Signed)
This patient needs order for a lift chair.  Dx: I50.9, E 66.01, M16.11.  Please send the order and let me know if I need to do anything else.  Thanks.

## 2023-01-29 NOTE — Assessment & Plan Note (Signed)
Allopurinol MWF.  Rare sx.  Labs pending.  Compliant with med.  Continue allopurinol as is.  See notes on labs.

## 2023-01-29 NOTE — Assessment & Plan Note (Addendum)
D/w pt about oxycodone and meloxicam use.  Taking 40mg  oxycodone for hip pain at baseline.  He is more mobile in the meantime due to weight loss, was able to come to clinic.  He is trying to put up with knee shoulder and hip pain until he can get his weight lower to see about options.  D/w pt about lift chair- this is a reasonable option.  D/w pt about exercise at home.  Using walker in the meantime.  See following phone note.  Not sedated.  He could try using up to 50 mg of oxycodone per day.  See updated orders.  He will update me as needed.  Oxycodone cautions discussed with patient.

## 2023-01-29 NOTE — Assessment & Plan Note (Signed)
seb cyst vs hidradenitis suppurativa noted on the back of the neck.  Without tenderness or discharge or spreading erythema.  Observation for now.  He can update me as needed.  Can use warm compress if needed.

## 2023-01-30 DIAGNOSIS — I10 Essential (primary) hypertension: Secondary | ICD-10-CM | POA: Diagnosis not present

## 2023-01-30 NOTE — Telephone Encounter (Signed)
DME order placed and message sent to [employer] team

## 2023-01-30 NOTE — Addendum Note (Signed)
Addended by: Wendie Simmer B on: 01/30/2023 11:56 AM   Modules accepted: Orders

## 2023-01-31 DIAGNOSIS — I10 Essential (primary) hypertension: Secondary | ICD-10-CM | POA: Diagnosis not present

## 2023-01-31 NOTE — Progress Notes (Signed)
This encounter was created in error - please disregard.

## 2023-02-01 DIAGNOSIS — I10 Essential (primary) hypertension: Secondary | ICD-10-CM | POA: Diagnosis not present

## 2023-02-02 DIAGNOSIS — I10 Essential (primary) hypertension: Secondary | ICD-10-CM | POA: Diagnosis not present

## 2023-02-03 DIAGNOSIS — I10 Essential (primary) hypertension: Secondary | ICD-10-CM | POA: Diagnosis not present

## 2023-02-04 DIAGNOSIS — I10 Essential (primary) hypertension: Secondary | ICD-10-CM | POA: Diagnosis not present

## 2023-02-05 DIAGNOSIS — I10 Essential (primary) hypertension: Secondary | ICD-10-CM | POA: Diagnosis not present

## 2023-02-06 DIAGNOSIS — I10 Essential (primary) hypertension: Secondary | ICD-10-CM | POA: Diagnosis not present

## 2023-02-07 DIAGNOSIS — I10 Essential (primary) hypertension: Secondary | ICD-10-CM | POA: Diagnosis not present

## 2023-02-08 DIAGNOSIS — I10 Essential (primary) hypertension: Secondary | ICD-10-CM | POA: Diagnosis not present

## 2023-02-09 DIAGNOSIS — I10 Essential (primary) hypertension: Secondary | ICD-10-CM | POA: Diagnosis not present

## 2023-02-10 DIAGNOSIS — I10 Essential (primary) hypertension: Secondary | ICD-10-CM | POA: Diagnosis not present

## 2023-02-11 DIAGNOSIS — I10 Essential (primary) hypertension: Secondary | ICD-10-CM | POA: Diagnosis not present

## 2023-02-12 DIAGNOSIS — I10 Essential (primary) hypertension: Secondary | ICD-10-CM | POA: Diagnosis not present

## 2023-02-13 DIAGNOSIS — I10 Essential (primary) hypertension: Secondary | ICD-10-CM | POA: Diagnosis not present

## 2023-02-14 DIAGNOSIS — I10 Essential (primary) hypertension: Secondary | ICD-10-CM | POA: Diagnosis not present

## 2023-02-15 ENCOUNTER — Other Ambulatory Visit: Payer: Self-pay | Admitting: Family Medicine

## 2023-02-15 DIAGNOSIS — I10 Essential (primary) hypertension: Secondary | ICD-10-CM | POA: Diagnosis not present

## 2023-02-16 ENCOUNTER — Other Ambulatory Visit: Payer: Self-pay | Admitting: Family Medicine

## 2023-02-16 ENCOUNTER — Telehealth: Payer: Self-pay | Admitting: Family Medicine

## 2023-02-16 DIAGNOSIS — I10 Essential (primary) hypertension: Secondary | ICD-10-CM | POA: Diagnosis not present

## 2023-02-16 DIAGNOSIS — M160 Bilateral primary osteoarthritis of hip: Secondary | ICD-10-CM

## 2023-02-16 NOTE — Telephone Encounter (Signed)
Radiation protection practitioner (Physical Therapist) with [facility] contacted the office regarding this patient. States they are requesting outpatient physical therapy referral for patient. Would like this sent to [facility] (fax- [phone]). Please advise Florentina Addison if needed, [phone].

## 2023-02-17 DIAGNOSIS — I10 Essential (primary) hypertension: Secondary | ICD-10-CM | POA: Diagnosis not present

## 2023-02-17 NOTE — Telephone Encounter (Signed)
Done. Thanks.

## 2023-02-17 NOTE — Addendum Note (Signed)
Addended by: Joaquim Nam on: 02/17/2023 11:30 AM   Modules accepted: Orders

## 2023-02-18 DIAGNOSIS — I10 Essential (primary) hypertension: Secondary | ICD-10-CM | POA: Diagnosis not present

## 2023-02-19 DIAGNOSIS — I10 Essential (primary) hypertension: Secondary | ICD-10-CM | POA: Diagnosis not present

## 2023-02-20 DIAGNOSIS — I10 Essential (primary) hypertension: Secondary | ICD-10-CM | POA: Diagnosis not present

## 2023-02-21 DIAGNOSIS — I10 Essential (primary) hypertension: Secondary | ICD-10-CM | POA: Diagnosis not present

## 2023-02-21 NOTE — Telephone Encounter (Signed)
Jae Dire, PT called to follow up on this. Advised her that referral was placed.

## 2023-02-22 DIAGNOSIS — I10 Essential (primary) hypertension: Secondary | ICD-10-CM | POA: Diagnosis not present

## 2023-02-23 DIAGNOSIS — I10 Essential (primary) hypertension: Secondary | ICD-10-CM | POA: Diagnosis not present

## 2023-02-24 DIAGNOSIS — I10 Essential (primary) hypertension: Secondary | ICD-10-CM | POA: Diagnosis not present

## 2023-02-25 DIAGNOSIS — I10 Essential (primary) hypertension: Secondary | ICD-10-CM | POA: Diagnosis not present

## 2023-02-26 DIAGNOSIS — I10 Essential (primary) hypertension: Secondary | ICD-10-CM | POA: Diagnosis not present

## 2023-02-27 ENCOUNTER — Ambulatory Visit: Payer: Self-pay

## 2023-02-27 ENCOUNTER — Telehealth: Payer: Self-pay | Admitting: Family Medicine

## 2023-02-27 DIAGNOSIS — M160 Bilateral primary osteoarthritis of hip: Secondary | ICD-10-CM

## 2023-02-27 DIAGNOSIS — I10 Essential (primary) hypertension: Secondary | ICD-10-CM | POA: Diagnosis not present

## 2023-02-27 NOTE — Telephone Encounter (Signed)
Please re-enter the PT referral for Michael Payne. Marylu Lund cancelled it because referring informaton was "grayed" out and she could not changed it.  She said best to re-enter the entire referral again  Please call Marylu Lund if you  have any questions [phone]

## 2023-02-27 NOTE — Patient Outreach (Signed)
  Care Coordination   Follow Up Visit Note   02/27/2023 Name: TRAVELL DESAULNIERS MRN: [id number] DOB: Aug 29, 1971  [name] is a 52 y.o. year old male who sees Joaquim Nam, MD for primary care. I spoke with  [name] by phone today.  What matters to the patients health and wellness today?  Patient states he is doing wonderful.  Denies any new symptoms. Patient reports recent blood pressure is 119/73. He states pain level is 3 regarding his knees.   Patient denies any further needs or concerns and is agreeable that care coordination goals have been met.    Goals Addressed             This Visit's Progress    Patient Stated:  Ongoing weight loss / management of osteoarthritis       Interventions Today    Flowsheet Row Most Recent Value  Chronic Disease   Chronic disease during today's visit Hypertension (HTN), Other  [chronic joint pain/ osteoarthritis]  General Interventions   General Interventions Discussed/Reviewed General Interventions Reviewed, Doctor Visits  [evaluation of current treatment plan for HTN, Osteoarthritis/ chronic pain and patients adherence to plan as established by provider. Assessed for blood pressure readings and pain level]  Doctor Visits Discussed/Reviewed Doctor Visits Reviewed  Annabell Sabal upcoming / scheduled provider appointments. Advised to follow up with provider as recommended.]  Exercise Interventions   Exercise Discussed/Reviewed Exercise Reviewed  [Encouraged ongoing exercise as tolerated.]  Pharmacy Interventions   Pharmacy Dicussed/Reviewed Pharmacy Topics Reviewed  [medications reviewed and compliance discussed.]                 SDOH assessments and interventions completed:  No     Care Coordination Interventions:  Yes, provided   Follow up plan: No further intervention required.   Encounter Outcome:  Pt. Visit Completed   George Ina RN,BSN,CCM [facility] Care Coordination [phone] direct line

## 2023-02-27 NOTE — Telephone Encounter (Signed)
I put in the new referral.  Thanks.

## 2023-02-28 DIAGNOSIS — I10 Essential (primary) hypertension: Secondary | ICD-10-CM | POA: Diagnosis not present

## 2023-03-01 DIAGNOSIS — I10 Essential (primary) hypertension: Secondary | ICD-10-CM | POA: Diagnosis not present

## 2023-03-02 ENCOUNTER — Telehealth: Payer: Self-pay | Admitting: Family Medicine

## 2023-03-02 ENCOUNTER — Encounter: Payer: Self-pay | Admitting: Family Medicine

## 2023-03-02 ENCOUNTER — Other Ambulatory Visit: Payer: Self-pay | Admitting: Family Medicine

## 2023-03-02 DIAGNOSIS — I10 Essential (primary) hypertension: Secondary | ICD-10-CM | POA: Diagnosis not present

## 2023-03-02 NOTE — Telephone Encounter (Signed)
Prescription Request  03/02/2023  LOV: 01/26/2023  What is the name of the medication or equipment?  oxyCODONE-acetaminophen (PERCOCET) 10-325 MG tablet   Have you contacted your pharmacy to request a refill? Yes   Which pharmacy would you like this sent to?  [id number] [place], [place] - [address] [place] [address] Phone: [phone] Fax: [phone]     Patient notified that their request is being sent to the clinical staff for review and that they should receive a response within 2 business days.   Please advise at Mobile [phone] (mobile)

## 2023-03-02 NOTE — Telephone Encounter (Signed)
Patient has two rxs on file at [facility]; I confirmed this this morning. Please deny request

## 2023-03-02 NOTE — Telephone Encounter (Signed)
Per EMR, Dr. Para March sent in three rxs on 01/26/23. There should be 2 rxs on file at [facility]. I have notified the patient of this via mychart.

## 2023-03-03 DIAGNOSIS — I10 Essential (primary) hypertension: Secondary | ICD-10-CM | POA: Diagnosis not present

## 2023-03-03 NOTE — Telephone Encounter (Signed)
Done. Thanks.

## 2023-03-04 DIAGNOSIS — I10 Essential (primary) hypertension: Secondary | ICD-10-CM | POA: Diagnosis not present

## 2023-03-05 DIAGNOSIS — I10 Essential (primary) hypertension: Secondary | ICD-10-CM | POA: Diagnosis not present

## 2023-03-06 DIAGNOSIS — I10 Essential (primary) hypertension: Secondary | ICD-10-CM | POA: Diagnosis not present

## 2023-03-07 DIAGNOSIS — I10 Essential (primary) hypertension: Secondary | ICD-10-CM | POA: Diagnosis not present

## 2023-03-08 DIAGNOSIS — I10 Essential (primary) hypertension: Secondary | ICD-10-CM | POA: Diagnosis not present

## 2023-03-09 DIAGNOSIS — I10 Essential (primary) hypertension: Secondary | ICD-10-CM | POA: Diagnosis not present

## 2023-03-10 DIAGNOSIS — I10 Essential (primary) hypertension: Secondary | ICD-10-CM | POA: Diagnosis not present

## 2023-03-11 DIAGNOSIS — I10 Essential (primary) hypertension: Secondary | ICD-10-CM | POA: Diagnosis not present

## 2023-03-12 DIAGNOSIS — I10 Essential (primary) hypertension: Secondary | ICD-10-CM | POA: Diagnosis not present

## 2023-03-13 DIAGNOSIS — I10 Essential (primary) hypertension: Secondary | ICD-10-CM | POA: Diagnosis not present

## 2023-03-14 DIAGNOSIS — I10 Essential (primary) hypertension: Secondary | ICD-10-CM | POA: Diagnosis not present

## 2023-03-14 NOTE — Therapy (Unsigned)
OUTPATIENT PHYSICAL THERAPY LOWER EXTREMITY EVALUATION   Patient Name: Michael Payne MRN: [id number] DOB:Dec 29, 1970, 52 y.o., male Today's Date: 03/16/2023  END OF SESSION:  PT End of Session - 03/16/23 1317     Visit Number 1    Number of Visits 8    Date for PT Re-Evaluation 05/11/23    Authorization Type [employer] MCR    PT Start Time 1140    PT Stop Time 1215    PT Time Calculation (min) 35 min    Activity Tolerance Patient tolerated treatment well    Behavior During Therapy WFL for tasks assessed/performed             Past Medical History:  Diagnosis Date   Anemia    borderline anemia, was instructed to eat more iron filled foods   Cardiac enlargement 06/03/2022   Noted on Xray   CHF (congestive heart failure) (HCC) 05/2021   GERD (gastroesophageal reflux disease)    lactose intolerance, patient denies symptoms of reflux   Hip pain    History of kidney stones    HTN (hypertension)    Morbid obesity (HCC)    Obesity    Pneumonia 05/2021   Pre-diabetes    Sleep apnea    Past Surgical History:  Procedure Laterality Date   FOOT SURGERY Right    KNEE ARTHROSCOPY WITH MEDIAL MENISECTOMY Left 12/16/2014   Procedure: KNEE ARTHROSCOPY WITH MEDIAL MENISECTOMY;  Surgeon: Sheral Apley, MD;  Location: [facility] OR;  Service: Orthopedics;  Laterality: Left;   UPPER GI ENDOSCOPY N/A 06/20/2022   Procedure: UPPER GI ENDOSCOPY;  Surgeon: Quentin Ore, MD;  Location: [facility];  Service: General;  Laterality: N/A;   Patient Active Problem List   Diagnosis Date Noted   Advance care planning 01/29/2023   Skin lesion 01/29/2023   Chronic pain 01/29/2023   Hyperglycemia 11/01/2022   Morbid obesity with BMI of 60.0-69.9, adult (HCC) 06/20/2022   Right shoulder pain 05/11/2022   Hordeolum externum of right eye 02/18/2022   Nonobstructive transaminitis 06/28/2021   Decubitus ulcer of posterior thigh, stage 2 (HCC) 06/28/2021   Obesity, Class III, BMI 40-49.9 (morbid  obesity) (HCC) 06/10/2021   Osteoarthritis 06/10/2021   Pressure injury of skin 06/09/2021   Respiratory failure (HCC) 06/01/2021   Acute diastolic CHF (congestive heart failure) (HCC) 05/31/2021   CHF (congestive heart failure) (HCC) 05/31/2021   Hypoxia 05/31/2021   Gout, unspecified 03/10/2021   Edema 03/10/2021   Muscle spasms of both lower extremities 07/29/2020   Bedbound 05/22/2020   Unilateral osteoarthritis of hip, right 12/27/2019   Severe obstructive sleep apnea 02/22/2018   Hypertriglyceridemia 08/24/2017   Low HDL (under 40) 08/24/2017   Metabolic syndrome 08/24/2017   Morbid obesity (HCC) 10/14/2013   HTN (hypertension) 10/14/2013    PCP: Joaquim Nam, MD  REFERRING PROVIDER: Joaquim Nam, MD  REFERRING DIAG: M16.0 (ICD-10-CM) - Osteoarthritis of both hips, unspecified osteoarthritis type  THERAPY DIAG:  Pain of both hip joints  Other abnormalities of gait and mobility  Muscle weakness (generalized)  Rationale for Evaluation and Treatment: Rehabilitation  ONSET DATE: chronic  SUBJECTIVE:   SUBJECTIVE STATEMENT: Reports being bed ridden for 5 years due to work injury.  Began walking 6 months ago with RW.  Has had bariatric surgery to manage his weight and is down from 600 lbs.  Patient has a goal of ambulating w/o RW but has not done so for several years.  PERTINENT HISTORY: Chronic pain follow  up.  D/w pt about oxycodone and meloxicam use.  Taking 40mg  oxycodone for hip pain at baseline.  He is more mobile in the meantime due to weight loss, was able to come to clinic.  He is trying to put up with knee shoulder and hip pain until he can get his weight lower to see about options.  D/w pt about lift chair- this is a reasonable option.  D/w pt about exercise at home.  Using walker in the meantime.  See following phone note.     PAIN:  Are you having pain? Yes: NPRS scale: 9/10 Pain location: B hips Pain description: ache Aggravating factors:  activity Relieving factors: medication  PRECAUTIONS: None  WEIGHT BEARING RESTRICTIONS: No  FALLS:  Has patient fallen in last 6 months? No  LIVING ENVIRONMENT: Lives with: lives with their daughter Lives in: House/apartment Stairs:  5 Has following equipment at home: Environmental consultant - 2 wheeled  OCCUPATION: not working  PLOF: Independent  PATIENT GOALS: To manage my hip pain  NEXT MD VISIT: pending  OBJECTIVE:   DIAGNOSTIC FINDINGS: none  PATIENT SURVEYS:  FOTO: 43(60 predicted)  MUSCLE LENGTH: Hamstrings: Right 45 deg; Left 45 deg (measured in seated)  POSTURE:  unable to assess due to body habitus  PALPATION: Deferred due to body habitus  LOWER EXTREMITY ROM:  ROM Right eval Left eval  Hip flexion    Hip extension    Hip abduction    Hip adduction    Hip internal rotation    Hip external rotation    Knee flexion    Knee extension    Ankle dorsiflexion    Ankle plantarflexion    Ankle inversion    Ankle eversion     (Blank rows = not tested)  LOWER EXTREMITY MMT:  MMT Right eval Left eval  Hip flexion 3+ 3+  Hip extension 3+ 3+  Hip abduction 3+ 3+  Hip adduction    Hip internal rotation    Hip external rotation    Knee flexion 3+ 3+  Knee extension 3+ 3+  Ankle dorsiflexion    Ankle plantarflexion 3+ 3+  Ankle inversion    Ankle eversion     (Blank rows = not tested)  LOWER EXTREMITY SPECIAL TESTS:  UTA due to body habitus  FUNCTIONAL TESTS:  30 seconds chair stand test 0 reps 2 MWT 252ft with RW  GAIT: Distance walked: 37ftx2 Assistive device utilized: Environmental consultant - 2 wheeled Level of assistance: Complete Independence Comments: slow cadence, flexed poture   TODAY'S TREATMENT:                                                                                                                              DATE: Eval and HEP    PATIENT EDUCATION:  Education details: Discussed eval findings, rehab rationale and POC and patient is in  agreement  Person educated: Patient Education method: Explanation Education comprehension: verbalized understanding and needs further education  HOME EXERCISE PROGRAM: Access Code: [email or web address] URL: [email or web address] Date: 03/16/2023 Prepared by: Gustavus Bryant  Exercises - Seated Hamstring Stretch  - 2 x daily - 5 x weekly - 1 sets - 2 reps - 30s hold - Standing Heel Raise with Support  - 2 x daily - 5 x weekly - 2 sets - 15 reps - Bilateral Long Arc Quad  - 2 x daily - 5 x weekly - 2 sets - 15 reps  ASSESSMENT:  CLINICAL IMPRESSION: Patient is a 52 y.o. male who was seen today for physical therapy evaluation and treatment for B hip pain due to advanced arthritic changes. Patient presents with decreased mobility due to weakness, general deconditioning and body habitus.  2 MWT shows limited functional mobility.  FOTO score shows patient at 43% function.  Discussed need to adhere to diet and consider repeat imaging studies to assess condition of B hip OA.  Will consider aquatic PT based on performance during land based sessions.  OBJECTIVE IMPAIRMENTS: Abnormal gait, decreased activity tolerance, decreased endurance, decreased mobility, difficulty walking, decreased ROM, decreased strength, improper body mechanics, obesity, and pain.   ACTIVITY LIMITATIONS: bending, sitting, standing, squatting, stairs, transfers, bed mobility, and locomotion level  PERSONAL FACTORS: Age, Fitness, Past/current experiences, Time since onset of injury/illness/exacerbation, and 1 comorbidity: obesity  are also affecting patient's functional outcome.   REHAB POTENTIAL: Fair based on body habitus, chronicity and underlying degenerative changes  CLINICAL DECISION MAKING: Stable/uncomplicated  EVALUATION COMPLEXITY: Low   GOALS: Goals reviewed with patient? No  SHORT TERM GOALS=LONG TERM GOALS: Target date: 04/13/2023   Patient to demonstrate independence in HEP  Baseline: [id number] Goal  status: INITIAL  2.  Increase 30s chair stand test to 1 rep Baseline: 0 reps Goal status: INITIAL  3.  Increase distance on 2 MWT to 254ft+ Baseline: 238ft with RW Goal status: INITIAL  4.  Increase BLE strength to 4-/5 Baseline:  MMT Right eval Left eval  Hip flexion 3+ 3+  Hip extension 3+ 3+  Hip abduction 3+ 3+  Hip adduction    Hip internal rotation    Hip external rotation    Knee flexion 3+ 3+  Knee extension 3+ 3+  Ankle dorsiflexion    Ankle plantarflexion 3+ 3+   Goal status: INITIAL      PLAN:  PT FREQUENCY: 2x/week  PT DURATION: 4 weeks  PLANNED INTERVENTIONS: Therapeutic exercises, Therapeutic activity, Neuromuscular re-education, Balance training, Gait training, Patient/Family education, Self Care, Joint mobilization, Stair training, DME instructions, Aquatic Therapy, Electrical stimulation, Cryotherapy, Moist heat, Manual therapy, and Re-evaluation  PLAN FOR NEXT SESSION: HEP review and update, manual techniques as appropriate, aerobic tasks, ROM and flexibility activities, strengthening and PREs, TPDN, gait and balance training as needed     Hildred Laser, PT 03/16/2023, 1:19 PM   Referring diagnosis? B hip pain Treatment diagnosis? (if different than referring diagnosis) B OA of hips What was this (referring dx) caused by? []  Surgery []  Fall []  Ongoing issue [x]  Arthritis []  Other: ____________  Laterality: []  Rt []  Lt [x]  Both  Check all possible CPT codes:  *CHOOSE 10 OR LESS*    [x]  97110 (Therapeutic Exercise)  []  92507 (SLP Treatment)  [x]  14782 (Neuro Re-ed)   []  92526 (Swallowing Treatment)   [x]  97116 (Gait Training)   []  [id number] (Cognitive Training, 1st 15 minutes) [x]  97140 (Manual Therapy)   []  97130 (Cognitive Training, each add'l 15 minutes)  [x]  97164 (Re-evaluation)                              []   Other, List CPT Code ____________  [x]  97530 (Therapeutic Activities)     [x]  97535 (Self Care)   [x]  All codes above  (97110 - 97535)  []  97012 (Mechanical Traction)  []  97014 (E-stim Unattended)  []  97032 (E-stim manual)  []  16109 (Ionto)  []  97035 (Ultrasound) []  97750 (Physical Performance Training) []  [id number] (Aquatic Therapy) []  97016 (Vasopneumatic Device) []  [id number] (Paraffin) []  97034 (Contrast Bath) []  97597 (Wound Care 1st 20 sq cm) []  97598 (Wound Care each add'l 20 sq cm) []  97760 (Orthotic Fabrication, Fitting, Training Initial) []  [id number] (Prosthetic Management and Training Initial) []  [id number] (Orthotic or Prosthetic Training/ Modification Subsequent)

## 2023-03-15 DIAGNOSIS — I10 Essential (primary) hypertension: Secondary | ICD-10-CM | POA: Diagnosis not present

## 2023-03-16 ENCOUNTER — Ambulatory Visit: Payer: Medicare HMO | Attending: Family Medicine

## 2023-03-16 ENCOUNTER — Other Ambulatory Visit: Payer: Self-pay

## 2023-03-16 DIAGNOSIS — M160 Bilateral primary osteoarthritis of hip: Secondary | ICD-10-CM | POA: Insufficient documentation

## 2023-03-16 DIAGNOSIS — M6281 Muscle weakness (generalized): Secondary | ICD-10-CM | POA: Insufficient documentation

## 2023-03-16 DIAGNOSIS — M25552 Pain in left hip: Secondary | ICD-10-CM | POA: Insufficient documentation

## 2023-03-16 DIAGNOSIS — M25551 Pain in right hip: Secondary | ICD-10-CM | POA: Insufficient documentation

## 2023-03-16 DIAGNOSIS — I10 Essential (primary) hypertension: Secondary | ICD-10-CM | POA: Diagnosis not present

## 2023-03-16 DIAGNOSIS — R2689 Other abnormalities of gait and mobility: Secondary | ICD-10-CM | POA: Diagnosis not present

## 2023-03-16 NOTE — Progress Notes (Signed)
  Physician Documentation Your signature is required to indicate approval of the treatment plan as stated above. By signing this report, you are approving the plan of care. Please sign and either send electronically or print and fax the signed copy to the number below. If you approve with modifications, please indicate those in the space provided. Physician Signature: ___[name]_________ Date:__06/27/24 _____ Time:___1:48 PM __

## 2023-03-21 ENCOUNTER — Ambulatory Visit (INDEPENDENT_AMBULATORY_CARE_PROVIDER_SITE_OTHER): Payer: Medicare HMO

## 2023-03-21 VITALS — Ht 76.0 in | Wt >= 6400 oz

## 2023-03-21 DIAGNOSIS — Z0000 Encounter for general adult medical examination without abnormal findings: Secondary | ICD-10-CM | POA: Diagnosis not present

## 2023-03-21 NOTE — Progress Notes (Signed)
Subjective:   Michael Payne is a 52 y.o. male who presents for Medicare Annual/Subsequent preventive examination.  Visit Complete: Virtual  I connected with  Michael Payne on 03/21/23 by a audio enabled telemedicine application and verified that I am speaking with the correct person using two identifiers.  Patient Location: Home  Provider Location: Home Office  I discussed the limitations of evaluation and management by telemedicine. The patient expressed understanding and agreed to proceed.  Patient Medicare AWV questionnaire was completed by the patient on ; I have confirmed that all information answered by patient is correct and no changes since this date.  Review of Systems     Cardiac Risk Factors include: advanced age (>45men, >87 women);hypertension;male gender;obesity (BMI >30kg/m2)     Objective:    Today's Vitals   03/21/23 1337  Weight: (!) 433 lb (196.4 kg)  Height: 6\' 4"  (1.93 m)   Body mass index is 52.71 kg/m.     03/21/2023    1:46 PM 06/20/2022   11:00 PM 06/09/2022    1:55 PM 05/17/2022    8:40 AM 05/31/2021    3:17 PM 04/06/2021   10:31 AM 02/11/2021    1:14 PM  Advanced Directives  Does Patient Have a Medical Advance Directive? Yes   No No No No  Type of Estate agent of [place];Living will        Does patient want to make changes to medical advance directive?    No - Patient declined  No - Patient declined   Copy of Healthcare Power of Attorney in Chart? No - copy requested        Would patient like information on creating a medical advance directive?    No - Patient declined No - Patient declined  Yes (MAU/Ambulatory/Procedural Areas - Information given)     Information is confidential and restricted. Go to Review Flowsheets to unlock data.    Current Medications (verified) Outpatient Encounter Medications as of 03/21/2023  Medication Sig   allopurinol (ZYLOPRIM) 100 MG tablet TAKE 1 TABLET EVERY OTHER DAY   Amino Acids  (AMINO ACID PO) Take 2 Scoops by mouth 2 (two) times a week.   amLODipine (NORVASC) 10 MG tablet TAKE 1 TABLET BY MOUTH EVERY DAY   carvedilol (COREG) 3.125 MG tablet TAKE 1 TABLET TWICE DAILY WITH MEALS   Multiple Vitamins-Minerals (MULTIVITAMIN WITH MINERALS) tablet Take 1 tablet by mouth daily.   naloxone (NARCAN) nasal spray 4 mg/0.1 mL Spray nostril if needed for opioid overuse- decreased consciousness, decreased respirations (Patient taking differently: Place 0.4 mg into the nose once.)   ondansetron (ZOFRAN-ODT) 4 MG disintegrating tablet Take 1 tablet (4 mg total) by mouth every 6 (six) hours as needed for nausea or vomiting.   oxyCODONE-acetaminophen (PERCOCET) 10-325 MG tablet Take 1-1.5 tablets by mouth every 6 (six) hours as needed for pain (Fill on or after 01/30/2023. max 5 tabs per day.  to replace prev rx.).   oxyCODONE-acetaminophen (PERCOCET) 10-325 MG tablet Take 1-1.5 tablets by mouth every 6 (six) hours as needed for pain (Fill on or after 30 days from rx. max 5 tabs per day.).   oxyCODONE-acetaminophen (PERCOCET) 10-325 MG tablet Take 1-1.5 tablets by mouth every 6 (six) hours as needed for pain (Fill on or after 60 days from rx. max 5 tabs per day.).   vitamin C (ASCORBIC ACID) 500 MG tablet Take 500 mg by mouth daily.   No facility-administered encounter medications on file  as of 03/21/2023.    Allergies (verified) Lisinopril, Aspirin, and Lactose intolerance (gi)   History: Past Medical History:  Diagnosis Date   Anemia    borderline anemia, was instructed to eat more iron filled foods   Cardiac enlargement 06/03/2022   Noted on Xray   CHF (congestive heart failure) (HCC) 05/2021   GERD (gastroesophageal reflux disease)    lactose intolerance, patient denies symptoms of reflux   Hip pain    History of kidney stones    HTN (hypertension)    Morbid obesity (HCC)    Obesity    Pneumonia 05/2021   Pre-diabetes    Sleep apnea    Past Surgical History:  Procedure  Laterality Date   FOOT SURGERY Right    KNEE ARTHROSCOPY WITH MEDIAL MENISECTOMY Left 12/16/2014   Procedure: KNEE ARTHROSCOPY WITH MEDIAL MENISECTOMY;  Surgeon: Sheral Apley, MD;  Location: [facility] OR;  Service: Orthopedics;  Laterality: Left;   UPPER GI ENDOSCOPY N/A 06/20/2022   Procedure: UPPER GI ENDOSCOPY;  Surgeon: Quentin Ore, MD;  Location: [facility];  Service: General;  Laterality: N/A;   Family History  Problem Relation Age of Onset   Heart attack Mother 65   Lung cancer Father    Cancer Father    CAD Maternal Grandfather    Colon cancer Neg Hx    Social History   Socioeconomic History   Marital status: Legally Separated    Spouse name: Not on file   Number of children: 2   Years of education: Not on file   Highest education level: Not on file  Occupational History   Occupation: disabled  Tobacco Use   Smoking status: Former    Packs/day: 2.00    Years: 22.00    Additional pack years: 0.00    Total pack years: 44.00    Types: Cigarettes    Quit date: 02/24/2011    Years since quitting: 12.0   Smokeless tobacco: Never  Vaping Use   Vaping Use: Never used  Substance and Sexual Activity   Alcohol use: Not Currently    Comment: Weekends/beer-- has slowed down  alot   Drug use: Not Currently    Types: Marijuana    Comment: former -  quit in 2012   Sexual activity: Yes    Partners: Female  Other Topics Concern   Not on file  Social History Narrative   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   Former Hydrologist.   Married.   Social Determinants of Health   Financial Resource Strain: Low Risk  (03/21/2023)   Overall Financial Resource Strain (CARDIA)    Difficulty of Paying Living Expenses: Not hard at all  Food Insecurity: No Food Insecurity (03/21/2023)   Hunger Vital Sign    Worried About Running Out of Food in the Last Year: Never true    Ran Out of Food in the Last Year: Never true  Transportation Needs: No Transportation Needs  (03/21/2023)   PRAPARE - Administrator, Civil Service (Medical): No    Lack of Transportation (Non-Medical): No  Physical Activity: Sufficiently Active (03/21/2023)   Exercise Vital Sign    Days of Exercise per Week: 6 days    Minutes of Exercise per Session: 120 min  Stress: No Stress Concern Present (03/21/2023)   [employer] of Occupational Health - Occupational Stress Questionnaire    Feeling of Stress : Not at all  Social Connections: Moderately Integrated (03/21/2023)   Social  Connection and Isolation Panel [NHANES]    Frequency of Communication with Friends and Family: More than three times a week    Frequency of Social Gatherings with Friends and Family: More than three times a week    Attends Religious Services: More than 4 times per year    Active Member of [employer] or Organizations: Yes    Attends Engineer, structural: More than 4 times per year    Marital Status: Divorced    Tobacco Counseling Counseling given: Not Answered   Clinical Intake:  Pre-visit preparation completed: No  Pain : No/denies pain     BMI - recorded: 52.71 Nutritional Status: BMI > 30  Obese Nutritional Risks: None Diabetes: No  How often do you need to have someone help you when you read instructions, pamphlets, or other written materials from your doctor or pharmacy?: 1 - Never  Interpreter Needed?: No  Information entered by :: Theresa Mulligan Lpn   Activities of Daily Living    03/21/2023    1:44 PM 06/20/2022   11:00 PM  In your present state of health, do you have any difficulty performing the following activities:  Hearing? 0   Vision? 0   Difficulty concentrating or making decisions? 0   Walking or climbing stairs? 0   Dressing or bathing? 0   Doing errands, shopping? 0   Preparing Food and eating ? N   Using the Toilet? N   In the past six months, have you accidently leaked urine? N   Do you have problems with loss of bowel control? N   Managing your  Medications? N   Managing your Finances? N   Housekeeping or managing your Housekeeping? N      Information is confidential and restricted. Go to Review Flowsheets to unlock data.    Patient Care Team: Joaquim Nam, MD as PCP - General (Family Medicine) Coralyn Helling, MD as Consulting Physician (Pulmonary Disease) [name], Hyman Hopes, MD as Consulting Physician (Surgery) Kathyrn Sheriff, [facility] (Inactive) as Pharmacist (Pharmacist)  Indicate any recent Medical Services you may have received from other than [facility] providers in the past year (date may be approximate).     Assessment:   This is a routine wellness examination for [place].  Hearing/Vision screen Hearing Screening - Comments:: Denies hearing difficulties   Vision Screening - Comments:: Wears rx glasses -   Not up to date with routine eye exams with  Deferred  Dietary issues and exercise activities discussed:     Goals Addressed               This Visit's Progress     lose weight (pt-stated)         Depression Screen    03/21/2023    1:44 PM 01/26/2023    9:41 AM 11/22/2022   12:39 PM 05/17/2022    8:25 AM 05/15/2021   10:08 AM 04/06/2021   10:33 AM 02/11/2021    1:21 PM  PHQ 2/9 Scores  PHQ - 2 Score 0 0 0 0 0 0 0  PHQ- 9 Score 0 0 0  0 0     Fall Risk    03/21/2023    1:45 PM 01/26/2023    9:41 AM 11/22/2022   12:39 PM 05/17/2022    8:39 AM 05/15/2021   10:08 AM  Fall Risk   Falls in the past year? 0 0 Exclusion - non ambulatory 1 0  Number falls in past yr: 0 0  0 0  Injury with Fall? 0 0  0 0  Risk for fall due to : No Fall Risks No Fall Risks  No Fall Risks No Fall Risks  Follow up Falls prevention discussed Falls evaluation completed   Falls evaluation completed    MEDICARE RISK AT HOME:  Medicare Risk at Home - 03/21/23 1351     Any stairs in or around the home? Yes    If so, are there any without handrails? No    Home free of loose throw rugs in walkways, pet beds, electrical cords, etc? Yes     Adequate lighting in your home to reduce risk of falls? Yes    Life alert? No    Use of a cane, walker or w/c? Yes    Grab bars in the bathroom? No    Shower chair or bench in shower? No    Elevated toilet seat or a handicapped toilet? No             TIMED UP AND GO:  Was the test performed?  No    Cognitive Function:    04/06/2021   10:38 AM 03/24/2020    8:22 AM  MMSE - Mini Mental State Exam  Not completed: Refused Refused        03/21/2023    1:46 PM 05/17/2022    8:41 AM 05/15/2021   10:10 AM  6CIT Screen  What Year? 0 points 0 points 0 points  What month? 0 points 0 points 0 points  What time? 0 points 0 points 0 points  Count back from 20 0 points 0 points 0 points  Months in reverse 0 points 2 points 0 points  Repeat phrase 0 points 0 points 0 points  Total Score 0 points 2 points 0 points    Immunizations Immunization History  Administered Date(s) Administered   Tdap 02/03/2012    TDAP status: Due, Education has been provided regarding the importance of this vaccine. Advised may receive this vaccine at local pharmacy or Health Dept. Aware to provide a copy of the vaccination record if obtained from local pharmacy or Health Dept. Verbalized acceptance and understanding.      Covid-19 vaccine status: Declined, Education has been provided regarding the importance of this vaccine but patient still declined. Advised may receive this vaccine at local pharmacy or Health Dept.or vaccine clinic. Aware to provide a copy of the vaccination record if obtained from local pharmacy or Health Dept. Verbalized acceptance and understanding.  Qualifies for Shingles Vaccine? Yes   Zostavax completed No   Shingrix Completed?: No.    Education has been provided regarding the importance of this vaccine. Patient has been advised to call insurance company to determine out of pocket expense if they have not yet received this vaccine. Advised may also receive vaccine at local  pharmacy or Health Dept. Verbalized acceptance and understanding.  Screening Tests Health Maintenance  Topic Date Due   FOOT EXAM  Never done   Diabetic kidney evaluation - Urine ACR  Never done   Hepatitis C Screening  Never done   Colonoscopy  Never done   Zoster Vaccines- Shingrix (1 of 2) Never done   DTaP/Tdap/Td (2 - Td or Tdap) 02/02/2022   Lung Cancer Screening  05/31/2022   OPHTHALMOLOGY EXAM  11/23/2022   HEMOGLOBIN A1C  07/29/2023   Diabetic kidney evaluation - eGFR measurement  01/26/2024   Medicare Annual Wellness (AWV)  03/20/2024   HIV Screening  Completed  HPV VACCINES  Aged Out   INFLUENZA VACCINE  Discontinued   COVID-19 Vaccine  Discontinued    Health Maintenance  Health Maintenance Due  Topic Date Due   FOOT EXAM  Never done   Diabetic kidney evaluation - Urine ACR  Never done   Hepatitis C Screening  Never done   Colonoscopy  Never done   Zoster Vaccines- Shingrix (1 of 2) Never done   DTaP/Tdap/Td (2 - Td or Tdap) 02/02/2022   Lung Cancer Screening  05/31/2022   OPHTHALMOLOGY EXAM  11/23/2022    Colorectal cancer screening: Referral to GI placed Patient deferred. Pt aware the office will call re: appt.  Lung Cancer Screening: (Low Dose CT Chest recommended if Age 60-80 years, 20 pack-year currently smoking OR have quit w/in 15years.) does qualify. Deferred    Additional Screening:  Hepatitis C Screening: does qualify;  Deferred  Vision Screening: Recommended annual ophthalmology exams for early detection of glaucoma and other disorders of the eye. Is the patient up to date with their annual eye exam?  No  Who is the provider or what is the name of the office in which the patient attends annual eye exams? Deferred If pt is not established with a provider, would they like to be referred to a provider to establish care? No .   Dental Screening: Recommended annual dental exams for proper oral hygiene   Community Resource Referral / Chronic Care  Management: CRR required this visit?  No   CCM required this visit?  No     Plan:     I have personally reviewed and noted the following in the patient's chart:   Medical and social history Use of alcohol, tobacco or illicit drugs  Current medications and supplements including opioid prescriptions. Patient is currently taking opioid prescriptions. Information provided to patient regarding non-opioid alternatives. Patient advised to discuss non-opioid treatment plan with their provider. Functional ability and status Nutritional status Physical activity Advanced directives List of other physicians Hospitalizations, surgeries, and ER visits in previous 12 months Vitals Screenings to include cognitive, depression, and falls Referrals and appointments  In addition, I have reviewed and discussed with patient certain preventive protocols, quality metrics, and best practice recommendations. A written personalized care plan for preventive services as well as general preventive health recommendations were provided to patient.     Tillie Rung, LPN   09/24/1094   After Visit Summary: (MyChart) Due to this being a telephonic visit, the after visit summary with patients personalized plan was offered to patient via MyChart   Nurse Notes: Patient due Hep-C Screening, Diabetic kidney evaluation- Urine ACR

## 2023-03-21 NOTE — Patient Instructions (Addendum)
Mr. Mcgrann , Thank you for taking time to come for your Medicare Wellness Visit. I appreciate your ongoing commitment to your health goals. Please review the following plan we discussed and let me know if I can assist you in the future.   These are the goals we discussed:  Goals       Increase physical activity (pt-stated)      Travel and go back to Gym again.      lose weight (pt-stated)      Need help to get out of house and to Dr Lianne Bushy (pt-stated)      Timeframe:  Long-Range Goal Priority:  High Start Date:       01/25/2021                      Expected End Date:  06/18/2021                     Follow Up Date 06/10/2021    -continue with  virtual  counseling Expect EMS pickup for MD appointment on 05/28/21 (approved) - keep 90 percent of counseling appointments - schedule counseling appointment  -follow up with [facility] office re: staffing of hours, etc.  -follow up with Bariatric Coordinator for next steps  - begin a notebook of services in my neighborhood or community - call 211 when I need some help - follow-up on any referrals for help I am given - think ahead to make sure my need does not become an emergency - make a note about what I need to have by the phone or take with me, like an identification card or social security number have a back-up plan - have a back-up plan - make a list of family or friends that I can call -continue working with Home Health PT  Why is this important?   Knowing how and where to find help for yourself or family in your neighborhood and community is an important skill.  You will want to take some steps to learn how.    Notes:       Patient Stated      04/06/2021, I will maintain and continue medications as prescribed.       Patient Stated:  Ongoing weight loss / management of osteoarthritis      Interventions Today    Flowsheet Row Most Recent Value  Chronic Disease   Chronic disease during today's visit Hypertension (HTN), Other  [chronic  joint pain/ osteoarthritis]  General Interventions   General Interventions Discussed/Reviewed General Interventions Reviewed, Doctor Visits  [evaluation of current treatment plan for HTN, Osteoarthritis/ chronic pain and patients adherence to plan as established by provider. Assessed for blood pressure readings and pain level]  Doctor Visits Discussed/Reviewed Doctor Visits Reviewed  Annabell Sabal upcoming / scheduled provider appointments. Advised to follow up with provider as recommended.]  Exercise Interventions   Exercise Discussed/Reviewed Exercise Reviewed  [Encouraged ongoing exercise as tolerated.]  Pharmacy Interventions   Pharmacy Dicussed/Reviewed Pharmacy Topics Reviewed  [medications reviewed and compliance discussed.]                 This is a list of the screening recommended for you and due dates:  Health Maintenance  Topic Date Due   Complete foot exam   Never done   Yearly kidney health urinalysis for diabetes  Never done   Hepatitis C Screening  Never done   Colon Cancer Screening  Never done   Zoster (Shingles) Vaccine (  1 of 2) Never done   DTaP/Tdap/Td vaccine (2 - Td or Tdap) 02/02/2022   Screening for Lung Cancer  05/31/2022   Eye exam for diabetics  11/23/2022   Hemoglobin A1C  07/29/2023   Yearly kidney function blood test for diabetes  01/26/2024   Medicare Annual Wellness Visit  03/20/2024   HIV Screening  Completed   HPV Vaccine  Aged Out   Flu Shot  Discontinued   COVID-19 Vaccine  Discontinued   Opioid Pain Medicine Management Opioids are powerful medicines that are used to treat moderate to severe pain. When used for short periods of time, they can help you to: Sleep better. Do better in physical or occupational therapy. Feel better in the first few days after an injury. Recover from surgery. Opioids should be taken with the supervision of a trained health care provider. They should be taken for the shortest period of time possible. This is  because opioids can be addictive, and the longer you take opioids, the greater your risk of addiction. This addiction can also be called opioid use disorder. What are the risks? Using opioid pain medicines for longer than 3 days increases your risk of side effects. Side effects include: Constipation. Nausea and vomiting. Breathing difficulties (respiratory depression). Drowsiness. Confusion. Opioid use disorder. Itching. Taking opioid pain medicine for a long period of time can affect your ability to do daily tasks. It also puts you at risk for: Motor vehicle crashes. Depression. Suicide. Heart attack. Overdose, which can be life-threatening. What is a pain treatment plan? A pain treatment plan is an agreement between you and your health care provider. Pain is unique to each person, and treatments vary depending on your condition. To manage your pain, you and your health care provider need to work together. To help you do this: Discuss the goals of your treatment, including how much pain you might expect to have and how you will manage the pain. Review the risks and benefits of taking opioid medicines. Remember that a good treatment plan uses more than one approach and minimizes the chance of side effects. Be honest about the amount of medicines you take and about any drug or alcohol use. Get pain medicine prescriptions from only one health care provider. Pain can be managed with many types of alternative treatments. Ask your health care provider to refer you to one or more specialists who can help you manage pain through: Physical or occupational therapy. Counseling (cognitive behavioral therapy). Good nutrition. Biofeedback. Massage. Meditation. Non-opioid medicine. Following a gentle exercise program. How to use opioid pain medicine Taking medicine Take your pain medicine exactly as told by your health care provider. Take it only when you need it. If your pain gets less severe,  you may take less than your prescribed dose if your health care provider approves. If you are not having pain, do nottake pain medicine unless your health care provider tells you to take it. If your pain is severe, do nottry to treat it yourself by taking more pills than instructed on your prescription. Contact your health care provider for help. Write down the times when you take your pain medicine. It is easy to become confused while on pain medicine. Writing the time can help you avoid overdose. Take other over-the-counter or prescription medicines only as told by your health care provider. Keeping yourself and others safe  While you are taking opioid pain medicine: Do not drive, use machinery, or power tools. Do not sign legal documents.  Do not drink alcohol. Do not take sleeping pills. Do not supervise children by yourself. Do not do activities that require climbing or being in high places. Do not go to a lake, river, ocean, spa, or swimming pool. Do not share your pain medicine with anyone. Keep pain medicine in a locked cabinet or in a secure area where pets and children cannot reach it. Stopping your use of opioids If you have been taking opioid medicine for more than a few weeks, you may need to slowly decrease (taper) how much you take until you stop completely. Tapering your use of opioids can decrease your risk of symptoms of withdrawal, such as: Pain and cramping in the abdomen. Nausea. Sweating. Sleepiness. Restlessness. Uncontrollable shaking (tremors). Cravings for the medicine. Do not attempt to taper your use of opioids on your own. Talk with your health care provider about how to do this. Your health care provider may prescribe a step-down schedule based on how much medicine you are taking and how long you have been taking it. Getting rid of leftover pills Do not save any leftover pills. Get rid of leftover pills safely by: Taking the medicine to a prescription  take-back program. This is usually offered by the county or law enforcement. Bringing them to a pharmacy that has a drug disposal container. Flushing them down the toilet. Check the label or package insert of your medicine to see whether this is safe to do. Throwing them out in the trash. Check the label or package insert of your medicine to see whether this is safe to do. If it is safe to throw it out, remove the medicine from the original container, put it into a sealable bag or container, and mix it with used coffee grounds, food scraps, dirt, or cat litter before putting it in the trash. Follow these instructions at home: Activity Do exercises as told by your health care provider. Avoid activities that make your pain worse. Return to your normal activities as told by your health care provider. Ask your health care provider what activities are safe for you. General instructions You may need to take these actions to prevent or treat constipation: Drink enough fluid to keep your urine pale yellow. Take over-the-counter or prescription medicines. Eat foods that are high in fiber, such as beans, whole grains, and fresh fruits and vegetables. Limit foods that are high in fat and processed sugars, such as fried or sweet foods. Keep all follow-up visits. This is important. Where to find support If you have been taking opioids for a long time, you may benefit from receiving support for quitting from a local support group or counselor. Ask your health care provider for a referral to these resources in your area. Where to find more information Centers for Disease Control and Prevention (CDC): FootballExhibition.com.br [place] Food and Drug Administration (FDA): PumpkinSearch.com.ee Get help right away if: You may have taken too much of an opioid (overdosed). Common symptoms of an overdose: Your breathing is slower or more shallow than normal. You have a very slow heartbeat (pulse). You have slurred speech. You have nausea  and vomiting. Your pupils become very small. You have other potential symptoms: You are very confused. You faint or feel like you will faint. You have cold, clammy skin. You have blue lips or fingernails. You have thoughts of harming yourself or harming others. These symptoms may represent a serious problem that is an emergency. Do not wait to see if the symptoms will go away.  Get medical help right away. Call your local emergency services (911 in the [place] Do not drive yourself to the hospital.  If you ever feel like you may hurt yourself or others, or have thoughts about taking your own life, get help right away. Go to your nearest emergency department or: Call your local emergency services (911 in the [place] Call the [facility] (506-125-0279 in the [place] Call a suicide crisis helpline, such as the National Suicide Prevention Lifeline at 838-252-9847 or 988 in the [place] This is open 24 hours a day in the [place] Text the Crisis Text Line at 909-566-4349 (in the [place] Summary Opioid medicines can help you manage moderate to severe pain for a short period of time. A pain treatment plan is an agreement between you and your health care provider. Discuss the goals of your treatment, including how much pain you might expect to have and how you will manage the pain. If you think that you or someone else may have taken too much of an opioid, get medical help right away. This information is not intended to replace advice given to you by your health care provider. Make sure you discuss any questions you have with your health care provider. Document Revised: 03/31/2021 Document Reviewed: 12/16/2020 Elsevier Patient Education  2024 Elsevier Inc.  Advanced directives: Please bring a copy of your health care power of attorney and living will to the office to be added to your chart at your convenience.   Conditions/risks identified: None  Next appointment: Follow up in one year for  your annual wellness visit   Preventive Care 40-64 Years, Male Preventive care refers to lifestyle choices and visits with your health care provider that can promote health and wellness. What does preventive care include? A yearly physical exam. This is also called an annual well check. Dental exams once or twice a year. Routine eye exams. Ask your health care provider how often you should have your eyes checked. Personal lifestyle choices, including: Daily care of your teeth and gums. Regular physical activity. Eating a healthy diet. Avoiding tobacco and drug use. Limiting alcohol use. Practicing safe sex. Taking low-dose aspirin every day starting at age 54. What happens during an annual well check? The services and screenings done by your health care provider during your annual well check will depend on your age, overall health, lifestyle risk factors, and family history of disease. Counseling  Your health care provider may ask you questions about your: Alcohol use. Tobacco use. Drug use. Emotional well-being. Home and relationship well-being. Sexual activity. Eating habits. Work and work Astronomer. Screening  You may have the following tests or measurements: Height, weight, and BMI. Blood pressure. Lipid and cholesterol levels. These may be checked every 5 years, or more frequently if you are over 71 years old. Skin check. Lung cancer screening. You may have this screening every year starting at age 74 if you have a 30-pack-year history of smoking and currently smoke or have quit within the past 15 years. Fecal occult blood test (FOBT) of the stool. You may have this test every year starting at age 52. Flexible sigmoidoscopy or colonoscopy. You may have a sigmoidoscopy every 5 years or a colonoscopy every 10 years starting at age 58. Prostate cancer screening. Recommendations will vary depending on your family history and other risks. Hepatitis C blood test. Hepatitis B  blood test. Sexually transmitted disease (STD) testing. Diabetes screening. This is done by checking your blood sugar (glucose) after  you have not eaten for a while (fasting). You may have this done every 1-3 years. Discuss your test results, treatment options, and if necessary, the need for more tests with your health care provider. Vaccines  Your health care provider may recommend certain vaccines, such as: Influenza vaccine. This is recommended every year. Tetanus, diphtheria, and acellular pertussis (Tdap, Td) vaccine. You may need a Td booster every 10 years. Zoster vaccine. You may need this after age 26. Pneumococcal 13-valent conjugate (PCV13) vaccine. You may need this if you have certain conditions and have not been vaccinated. Pneumococcal polysaccharide (PPSV23) vaccine. You may need one or two doses if you smoke cigarettes or if you have certain conditions. Talk to your health care provider about which screenings and vaccines you need and how often you need them. This information is not intended to replace advice given to you by your health care provider. Make sure you discuss any questions you have with your health care provider. Document Released: 10/02/2015 Document Revised: 05/25/2016 Document Reviewed: 07/07/2015 Elsevier Interactive Patient Education  2017 [employer].  Fall Prevention in the Home Falls can cause injuries. They can happen to people of all ages. There are many things you can do to make your home safe and to help prevent falls. What can I do on the outside of my home? Regularly fix the edges of walkways and driveways and fix any cracks. Remove anything that might make you trip as you walk through a door, such as a raised step or threshold. Trim any bushes or trees on the path to your home. Use bright outdoor lighting. Clear any walking paths of anything that might make someone trip, such as rocks or tools. Regularly check to see if handrails are loose or  broken. Make sure that both sides of any steps have handrails. Any raised decks and porches should have guardrails on the edges. Have any leaves, snow, or ice cleared regularly. Use sand or salt on walking paths during winter. Clean up any spills in your garage right away. This includes oil or grease spills. What can I do in the bathroom? Use night lights. Install grab bars by the toilet and in the tub and shower. Do not use towel bars as grab bars. Use non-skid mats or decals in the tub or shower. If you need to sit down in the shower, use a plastic, non-slip stool. Keep the floor dry. Clean up any water that spills on the floor as soon as it happens. Remove soap buildup in the tub or shower regularly. Attach bath mats securely with double-sided non-slip rug tape. Do not have throw rugs and other things on the floor that can make you trip. What can I do in the bedroom? Use night lights. Make sure that you have a light by your bed that is easy to reach. Do not use any sheets or blankets that are too big for your bed. They should not hang down onto the floor. Have a firm chair that has side arms. You can use this for support while you get dressed. Do not have throw rugs and other things on the floor that can make you trip. What can I do in the kitchen? Clean up any spills right away. Avoid walking on wet floors. Keep items that you use a lot in easy-to-reach places. If you need to reach something above you, use a strong step stool that has a grab bar. Keep electrical cords out of the way. Do not use  floor polish or wax that makes floors slippery. If you must use wax, use non-skid floor wax. Do not have throw rugs and other things on the floor that can make you trip. What can I do with my stairs? Do not leave any items on the stairs. Make sure that there are handrails on both sides of the stairs and use them. Fix handrails that are broken or loose. Make sure that handrails are as long as  the stairways. Check any carpeting to make sure that it is firmly attached to the stairs. Fix any carpet that is loose or worn. Avoid having throw rugs at the top or bottom of the stairs. If you do have throw rugs, attach them to the floor with carpet tape. Make sure that you have a light switch at the top of the stairs and the bottom of the stairs. If you do not have them, ask someone to add them for you. What else can I do to help prevent falls? Wear shoes that: Do not have high heels. Have rubber bottoms. Are comfortable and fit you well. Are closed at the toe. Do not wear sandals. If you use a stepladder: Make sure that it is fully opened. Do not climb a closed stepladder. Make sure that both sides of the stepladder are locked into place. Ask someone to hold it for you, if possible. Clearly mark and make sure that you can see: Any grab bars or handrails. First and last steps. Where the edge of each step is. Use tools that help you move around (mobility aids) if they are needed. These include: Canes. Walkers. Scooters. Crutches. Turn on the lights when you go into a dark area. Replace any light bulbs as soon as they burn out. Set up your furniture so you have a clear path. Avoid moving your furniture around. If any of your floors are uneven, fix them. If there are any pets around you, be aware of where they are. Review your medicines with your doctor. Some medicines can make you feel dizzy. This can increase your chance of falling. Ask your doctor what other things that you can do to help prevent falls. This information is not intended to replace advice given to you by your health care provider. Make sure you discuss any questions you have with your health care provider. Document Released: 07/02/2009 Document Revised: 02/11/2016 Document Reviewed: 10/10/2014 Elsevier Interactive Patient Education  2017 [employer].

## 2023-03-28 ENCOUNTER — Ambulatory Visit: Payer: Medicare HMO | Attending: Family Medicine

## 2023-03-28 DIAGNOSIS — R2689 Other abnormalities of gait and mobility: Secondary | ICD-10-CM | POA: Diagnosis present

## 2023-03-28 DIAGNOSIS — M6281 Muscle weakness (generalized): Secondary | ICD-10-CM | POA: Diagnosis present

## 2023-03-28 DIAGNOSIS — M25552 Pain in left hip: Secondary | ICD-10-CM | POA: Diagnosis present

## 2023-03-28 DIAGNOSIS — M25551 Pain in right hip: Secondary | ICD-10-CM | POA: Insufficient documentation

## 2023-03-28 NOTE — Patient Instructions (Signed)
Aquatic Therapy at [facility]-  What to Expect!  Where:   [facility] [address] [facility] [place], [place] [address] Rehab phone [phone]  NOTE:  You will receive an automated phone message reminding you of your appt and it will say the appointment is at the [address] [facility] [address] [facility] clinic.          How to Prepare: Please make sure you drink 8 ounces of water about one hour prior to your pool session A caregiver may attend if needed with the patient to help assist as needed. A caregiver can sit in the pool room on chair. Please arrive IN YOUR SUIT and 15 minutes prior to your appointment - this helps to avoid delays in starting your session. Please make sure to attend to any toileting needs prior to entering the pool Locker rooms for changing are provided.   There is direct access to the pool deck form the locker room.  You can lock your belongings in a locker with lock provided. Once on the pool deck your therapist will ask if you have signed the Patient  Consent and Assignment of Benefits form before beginning treatment Your therapist may take your blood pressure prior to, during and after your session if indicated We usually try and create a home exercise program based on activities we do in the pool.  Please be thinking about who might be able to assist you in the pool should you need to participate in an aquatic home exercise program at the time of discharge if you need assistance.  Some patients do not want to or do not have the ability to participate in an aquatic home program - this is not a barrier in any way to you participating in aquatic therapy as part of your current therapy plan! After Discharge from PT, you can continue using home program at  the  Aquatic Center/, there is a drop-in fee for $5 ($45 a month)or for 60 years  or older $4.00 ($40 a month for seniors ) or any local [employer] pool.  Memberships for purchase are  available for gym/pool at [facility]  IT IS VERY IMPORTANT THAT YOUR LAST VISIT BE IN THE CLINIC AT [address] AFTER YOUR LAST AQUATIC VISIT.  PLEASE MAKE SURE THAT YOU HAVE A [address]  APPOINTMENT SCHEDULED.   About the pool: Pool is located approximately 500 FT from the entrance of the building.  Please bring a support person if you need assistance traveling this      distance.   Your therapist will assist you in entering the water; there are two ways to           enter: stairs with railings, and a mechanical lift. Your therapist will determine the most appropriate way for you.  Water temperature is usually between 88-90 degrees  There may be up to 2 other swimmers in the pool at the same time  The pool deck is tile, please wear shoes with good traction if you prefer not to be barefoot.    Contact Info:  For appointment scheduling and cancellations:         Please call the [facility]  PH:[phone]              Aquatic Therapy  [facility]       All sessions are 45 minutes                                                    

## 2023-03-28 NOTE — Therapy (Unsigned)
OUTPATIENT PHYSICAL THERAPY TREATMENT NOTE   Patient Name: Michael Payne MRN: [id number] DOB:Jan 08, 1971, 52 y.o., male Today's Date: 03/28/2023  END OF SESSION:  PT End of Session - 03/28/23 1130     Visit Number 2    Number of Visits 8    Date for PT Re-Evaluation 05/11/23    Authorization Type [employer] MCR    PT Start Time 1130    PT Stop Time 1210    PT Time Calculation (min) 40 min    Activity Tolerance Patient tolerated treatment well    Behavior During Therapy WFL for tasks assessed/performed              Past Medical History:  Diagnosis Date   Anemia    borderline anemia, was instructed to eat more iron filled foods   Cardiac enlargement 06/03/2022   Noted on Xray   CHF (congestive heart failure) (HCC) 05/2021   GERD (gastroesophageal reflux disease)    lactose intolerance, patient denies symptoms of reflux   Hip pain    History of kidney stones    HTN (hypertension)    Morbid obesity (HCC)    Obesity    Pneumonia 05/2021   Pre-diabetes    Sleep apnea    Past Surgical History:  Procedure Laterality Date   FOOT SURGERY Right    KNEE ARTHROSCOPY WITH MEDIAL MENISECTOMY Left 12/16/2014   Procedure: KNEE ARTHROSCOPY WITH MEDIAL MENISECTOMY;  Surgeon: Sheral Apley, MD;  Location: [facility];  Service: Orthopedics;  Laterality: Left;   UPPER GI ENDOSCOPY N/A 06/20/2022   Procedure: UPPER GI ENDOSCOPY;  Surgeon: Quentin Ore, MD;  Location: [facility];  Service: General;  Laterality: N/A;   Patient Active Problem List   Diagnosis Date Noted   Advance care planning 01/29/2023   Skin lesion 01/29/2023   Chronic pain 01/29/2023   Hyperglycemia 11/01/2022   Morbid obesity with BMI of 60.0-69.9, adult (HCC) 06/20/2022   Right shoulder pain 05/11/2022   Hordeolum externum of right eye 02/18/2022   Nonobstructive transaminitis 06/28/2021   Decubitus ulcer of posterior thigh, stage 2 (HCC) 06/28/2021   Obesity, Class III, BMI 40-49.9 (morbid obesity) (HCC)  06/10/2021   Osteoarthritis 06/10/2021   Pressure injury of skin 06/09/2021   Respiratory failure (HCC) 06/01/2021   Acute diastolic CHF (congestive heart failure) (HCC) 05/31/2021   CHF (congestive heart failure) (HCC) 05/31/2021   Hypoxia 05/31/2021   Gout, unspecified 03/10/2021   Edema 03/10/2021   Muscle spasms of both lower extremities 07/29/2020   Bedbound 05/22/2020   Unilateral osteoarthritis of hip, right 12/27/2019   Severe obstructive sleep apnea 02/22/2018   Hypertriglyceridemia 08/24/2017   Low HDL (under 40) 08/24/2017   Metabolic syndrome 08/24/2017   Morbid obesity (HCC) 10/14/2013   HTN (hypertension) 10/14/2013    PCP: Joaquim Nam, MD  REFERRING PROVIDER: Joaquim Nam, MD  REFERRING DIAG: M16.0 (ICD-10-CM) - Osteoarthritis of both hips, unspecified osteoarthritis type  THERAPY DIAG:  Pain of both hip joints  Other abnormalities of gait and mobility  Muscle weakness (generalized)  Rationale for Evaluation and Treatment: Rehabilitation  ONSET DATE: chronic  SUBJECTIVE:   SUBJECTIVE STATEMENT: Patient reports continued BIL hip pain, HEP compliance.  PERTINENT HISTORY: Chronic pain follow up.  D/w pt about oxycodone and meloxicam use.  Taking 40mg  oxycodone for hip pain at baseline.  He is more mobile in the meantime due to weight loss, was able to come to clinic.  He is trying to put up  with knee shoulder and hip pain until he can get his weight lower to see about options.  D/w pt about lift chair- this is a reasonable option.  D/w pt about exercise at home.  Using walker in the meantime.  See following phone note.     PAIN:  Are you having pain? Yes: NPRS scale: 6/10 Pain location: B hips Pain description: ache Aggravating factors: activity Relieving factors: medication  PRECAUTIONS: None  WEIGHT BEARING RESTRICTIONS: No  FALLS:  Has patient fallen in last 6 months? No  LIVING ENVIRONMENT: Lives with: lives with their  daughter Lives in: House/apartment Stairs:  5 Has following equipment at home: Environmental consultant - 2 wheeled  OCCUPATION: not working  PLOF: Independent  PATIENT GOALS: To manage my hip pain  NEXT MD VISIT: pending  OBJECTIVE:   DIAGNOSTIC FINDINGS: none  PATIENT SURVEYS:  FOTO: 43(60 predicted)  MUSCLE LENGTH: Hamstrings: Right 45 deg; Left 45 deg (measured in seated)  POSTURE:  unable to assess due to body habitus  PALPATION: Deferred due to body habitus  LOWER EXTREMITY ROM:  ROM Right eval Left eval  Hip flexion    Hip extension    Hip abduction    Hip adduction    Hip internal rotation    Hip external rotation    Knee flexion    Knee extension    Ankle dorsiflexion    Ankle plantarflexion    Ankle inversion    Ankle eversion     (Blank rows = not tested)  LOWER EXTREMITY MMT:  MMT Right eval Left eval  Hip flexion 3+ 3+  Hip extension 3+ 3+  Hip abduction 3+ 3+  Hip adduction    Hip internal rotation    Hip external rotation    Knee flexion 3+ 3+  Knee extension 3+ 3+  Ankle dorsiflexion    Ankle plantarflexion 3+ 3+  Ankle inversion    Ankle eversion     (Blank rows = not tested)  LOWER EXTREMITY SPECIAL TESTS:  UTA due to body habitus  FUNCTIONAL TESTS:  30 seconds chair stand test 0 reps 2 MWT 211ft with RW  GAIT: Distance walked: 43ftx2 Assistive device utilized: Environmental consultant - 2 wheeled Level of assistance: Complete Independence Comments: slow cadence, flexed poture   TODAY'S TREATMENT:            [facility] Adult PT Treatment:                                                DATE: 03/28/23 Therapeutic Exercise: Seated marching x30" Standing hamstring curl 2x10 3# BIL Seated LAQ 3# x25 BIL Gait rolled into therex x185' Standing heel raises 2x10 (standing break btw sets) Standing marching 3x30" STS from elevated table 57 cm x5  DATE: Eval and HEP    PATIENT EDUCATION:  Education details: Discussed eval findings, rehab rationale and POC and patient is in agreement  Person educated: Patient Education method: Explanation Education comprehension: verbalized understanding and needs further education  HOME EXERCISE PROGRAM: Access Code: [email or web address] URL: [email or web address] Date: 03/16/2023 Prepared by: Gustavus Bryant  Exercises - Seated Hamstring Stretch  - 2 x daily - 5 x weekly - 1 sets - 2 reps - 30s hold - Standing Heel Raise with Support  - 2 x daily - 5 x weekly - 2 sets - 15 reps - Bilateral Long Arc Quad  - 2 x daily - 5 x weekly - 2 sets - 15 reps  ASSESSMENT:  CLINICAL IMPRESSION: Patient presents to first follow up PT session reporting continued BIL hip pain and HEP compliance. Session today focused on LE strengthening and increasing standing activity tolerance as patient is able. He needed frequent rest breaks from standing exercises, but is able to complete all exercises. He is interested in performing more standing exercises than seated as he has already done [facility] with mostly seated exercises and wants to gain strength and standing activity tolerance. Patient continues to benefit from skilled PT services and should be progressed as able to improve functional independence.   OBJECTIVE IMPAIRMENTS: Abnormal gait, decreased activity tolerance, decreased endurance, decreased mobility, difficulty walking, decreased ROM, decreased strength, improper body mechanics, obesity, and pain.   ACTIVITY LIMITATIONS: bending, sitting, standing, squatting, stairs, transfers, bed mobility, and locomotion level  PERSONAL FACTORS: Age, Fitness, Past/current experiences, Time since onset of injury/illness/exacerbation, and 1 comorbidity: obesity  are also affecting patient's functional outcome.   REHAB POTENTIAL: Fair based on body habitus, chronicity and underlying degenerative changes  CLINICAL DECISION  MAKING: Stable/uncomplicated  EVALUATION COMPLEXITY: Low   GOALS: Goals reviewed with patient? No  SHORT TERM GOALS=LONG TERM GOALS: Target date: 04/13/2023   Patient to demonstrate independence in HEP  Baseline: [id number] Goal status: INITIAL  2.  Increase 30s chair stand test to 1 rep Baseline: 0 reps Goal status: INITIAL  3.  Increase distance on 2 MWT to 282ft+ Baseline: 21ft with RW Goal status: INITIAL  4.  Increase BLE strength to 4-/5 Baseline:  MMT Right eval Left eval  Hip flexion 3+ 3+  Hip extension 3+ 3+  Hip abduction 3+ 3+  Hip adduction    Hip internal rotation    Hip external rotation    Knee flexion 3+ 3+  Knee extension 3+ 3+  Ankle dorsiflexion    Ankle plantarflexion 3+ 3+   Goal status: INITIAL      PLAN:  PT FREQUENCY: 2x/week  PT DURATION: 4 weeks  PLANNED INTERVENTIONS: Therapeutic exercises, Therapeutic activity, Neuromuscular re-education, Balance training, Gait training, Patient/Family education, Self Care, Joint mobilization, Stair training, DME instructions, Aquatic Therapy, Electrical stimulation, Cryotherapy, Moist heat, Manual therapy, and Re-evaluation  PLAN FOR NEXT SESSION: HEP review and update, manual techniques as appropriate, aerobic tasks, ROM and flexibility activities, strengthening and PREs, TPDN, gait and balance training as needed     Berta Minor, PTA 03/28/2023, 12:10 PM   Referring diagnosis? B hip pain Treatment diagnosis? (if different than referring diagnosis) B OA of hips What was this (referring dx) caused by? []  Surgery []  Fall []  Ongoing issue [x]  Arthritis []  Other: ____________  Laterality: []  Rt []  Lt [x]  Both  Check all possible CPT codes:  *CHOOSE 10 OR LESS*    [x]  97110 (Therapeutic Exercise)  []  92507 (SLP Treatment)  [x]  13086 (  Neuro Re-ed)   []  92526 (Swallowing Treatment)   [x]  97116 (Gait Training)   []  [phone] (Cognitive Training, 1st 15 minutes) [x]  97140 (Manual  Therapy)   []  60454 (Cognitive Training, each add'l 15 minutes)  [x]  97164 (Re-evaluation)                              []  Other, List CPT Code ____________  [x]  97530 (Therapeutic Activities)     [x]  97535 (Self Care)   [x]  All codes above (97110 - 97535)  []  97012 (Mechanical Traction)  []  97014 (E-stim Unattended)  []  97032 (E-stim manual)  []  97033 (Ionto)  []  97035 (Ultrasound) []  97750 (Physical Performance Training) []  [id number] (Aquatic Therapy) []  97016 (Vasopneumatic Device) []  [id number] (Paraffin) []  97034 (Contrast Bath) []  97597 (Wound Care 1st 20 sq cm) []  97598 (Wound Care each add'l 20 sq cm) []  97760 (Orthotic Fabrication, Fitting, Training Initial) []  [id number] (Prosthetic Management and Training Initial) []  [id number] (Orthotic or Prosthetic Training/ Modification Subsequent)

## 2023-03-28 NOTE — Therapy (Signed)
OUTPATIENT PHYSICAL THERAPY TREATMENT NOTE   Patient Name: Michael Payne MRN: [id number] DOB:05/23/1971, 52 y.o., male Today's Date: 03/28/2023  END OF SESSION:  PT End of Session - 03/28/23 1130     Visit Number 2    Number of Visits 8    Date for PT Re-Evaluation 05/11/23    Authorization Type [employer] MCR    PT Start Time 1130    PT Stop Time 1210    PT Time Calculation (min) 40 min    Activity Tolerance Patient tolerated treatment well    Behavior During Therapy WFL for tasks assessed/performed              Past Medical History:  Diagnosis Date   Anemia    borderline anemia, was instructed to eat more iron filled foods   Cardiac enlargement 06/03/2022   Noted on Xray   CHF (congestive heart failure) (HCC) 05/2021   GERD (gastroesophageal reflux disease)    lactose intolerance, patient denies symptoms of reflux   Hip pain    History of kidney stones    HTN (hypertension)    Morbid obesity (HCC)    Obesity    Pneumonia 05/2021   Pre-diabetes    Sleep apnea    Past Surgical History:  Procedure Laterality Date   FOOT SURGERY Right    KNEE ARTHROSCOPY WITH MEDIAL MENISECTOMY Left 12/16/2014   Procedure: KNEE ARTHROSCOPY WITH MEDIAL MENISECTOMY;  Surgeon: Sheral Apley, MD;  Location: [facility];  Service: Orthopedics;  Laterality: Left;   UPPER GI ENDOSCOPY N/A 06/20/2022   Procedure: UPPER GI ENDOSCOPY;  Surgeon: Quentin Ore, MD;  Location: [facility];  Service: General;  Laterality: N/A;   Patient Active Problem List   Diagnosis Date Noted   Advance care planning 01/29/2023   Skin lesion 01/29/2023   Chronic pain 01/29/2023   Hyperglycemia 11/01/2022   Morbid obesity with BMI of 60.0-69.9, adult (HCC) 06/20/2022   Right shoulder pain 05/11/2022   Hordeolum externum of right eye 02/18/2022   Nonobstructive transaminitis 06/28/2021   Decubitus ulcer of posterior thigh, stage 2 (HCC) 06/28/2021   Obesity, Class III, BMI 40-49.9 (morbid obesity) (HCC)  06/10/2021   Osteoarthritis 06/10/2021   Pressure injury of skin 06/09/2021   Respiratory failure (HCC) 06/01/2021   Acute diastolic CHF (congestive heart failure) (HCC) 05/31/2021   CHF (congestive heart failure) (HCC) 05/31/2021   Hypoxia 05/31/2021   Gout, unspecified 03/10/2021   Edema 03/10/2021   Muscle spasms of both lower extremities 07/29/2020   Bedbound 05/22/2020   Unilateral osteoarthritis of hip, right 12/27/2019   Severe obstructive sleep apnea 02/22/2018   Hypertriglyceridemia 08/24/2017   Low HDL (under 40) 08/24/2017   Metabolic syndrome 08/24/2017   Morbid obesity (HCC) 10/14/2013   HTN (hypertension) 10/14/2013    PCP: Joaquim Nam, MD  REFERRING PROVIDER: Joaquim Nam, MD  REFERRING DIAG: M16.0 (ICD-10-CM) - Osteoarthritis of both hips, unspecified osteoarthritis type  THERAPY DIAG:  Pain of both hip joints  Other abnormalities of gait and mobility  Muscle weakness (generalized)  Rationale for Evaluation and Treatment: Rehabilitation  ONSET DATE: chronic  SUBJECTIVE:   SUBJECTIVE STATEMENT: Patient reports continued BIL hip pain, HEP compliance.  PERTINENT HISTORY: Chronic pain follow up.  D/w pt about oxycodone and meloxicam use.  Taking 40mg  oxycodone for hip pain at baseline.  He is more mobile in the meantime due to weight loss, was able to come to clinic.  He is trying to put up  with knee shoulder and hip pain until he can get his weight lower to see about options.  D/w pt about lift chair- this is a reasonable option.  D/w pt about exercise at home.  Using walker in the meantime.  See following phone note.     PAIN:  Are you having pain? Yes: NPRS scale: 6/10 Pain location: B hips Pain description: ache Aggravating factors: activity Relieving factors: medication  PRECAUTIONS: None  WEIGHT BEARING RESTRICTIONS: No  FALLS:  Has patient fallen in last 6 months? No  LIVING ENVIRONMENT: Lives with: lives with their  daughter Lives in: House/apartment Stairs:  5 Has following equipment at home: Environmental consultant - 2 wheeled  OCCUPATION: not working  PLOF: Independent  PATIENT GOALS: To manage my hip pain  NEXT MD VISIT: pending  OBJECTIVE:   DIAGNOSTIC FINDINGS: none  PATIENT SURVEYS:  FOTO: 43(60 predicted)  MUSCLE LENGTH: Hamstrings: Right 45 deg; Left 45 deg (measured in seated)  POSTURE:  unable to assess due to body habitus  PALPATION: Deferred due to body habitus  LOWER EXTREMITY ROM:  ROM Right eval Left eval  Hip flexion    Hip extension    Hip abduction    Hip adduction    Hip internal rotation    Hip external rotation    Knee flexion    Knee extension    Ankle dorsiflexion    Ankle plantarflexion    Ankle inversion    Ankle eversion     (Blank rows = not tested)  LOWER EXTREMITY MMT:  MMT Right eval Left eval  Hip flexion 3+ 3+  Hip extension 3+ 3+  Hip abduction 3+ 3+  Hip adduction    Hip internal rotation    Hip external rotation    Knee flexion 3+ 3+  Knee extension 3+ 3+  Ankle dorsiflexion    Ankle plantarflexion 3+ 3+  Ankle inversion    Ankle eversion     (Blank rows = not tested)  LOWER EXTREMITY SPECIAL TESTS:  UTA due to body habitus  FUNCTIONAL TESTS:  30 seconds chair stand test 0 reps 2 MWT 285ft with RW  GAIT: Distance walked: 21ftx2 Assistive device utilized: Environmental consultant - 2 wheeled Level of assistance: Complete Independence Comments: slow cadence, flexed poture   TODAY'S TREATMENT:            [facility] Adult PT Treatment:                                                DATE: 03/28/23 Therapeutic Exercise: Seated marching x30" Standing hamstring curl 2x10 3# BIL Seated LAQ 3# x25 BIL Gait rolled into therex x185' Standing heel raises 2x10 (standing break btw sets) Standing marching 3x30" STS from elevated table 57 cm x5  DATE: Eval and HEP    PATIENT EDUCATION:  Education details: Discussed eval findings, rehab rationale and POC and patient is in agreement  Person educated: Patient Education method: Explanation Education comprehension: verbalized understanding and needs further education  HOME EXERCISE PROGRAM: Access Code: [email or web address] URL: [email or web address] Date: 03/16/2023 Prepared by: Gustavus Bryant  Exercises - Seated Hamstring Stretch  - 2 x daily - 5 x weekly - 1 sets - 2 reps - 30s hold - Standing Heel Raise with Support  - 2 x daily - 5 x weekly - 2 sets - 15 reps - Bilateral Long Arc Quad  - 2 x daily - 5 x weekly - 2 sets - 15 reps  ASSESSMENT:  CLINICAL IMPRESSION: Patient presents to first follow up PT session reporting continued BIL hip pain and HEP compliance. Session today focused on LE strengthening and increasing standing activity tolerance as patient is able. He needed frequent rest breaks from standing exercises, but is able to complete all exercises. He is interested in performing more standing exercises than seated as he has already done [facility] with mostly seated exercises and wants to gain strength and standing activity tolerance. Patient continues to benefit from skilled PT services and should be progressed as able to improve functional independence.   OBJECTIVE IMPAIRMENTS: Abnormal gait, decreased activity tolerance, decreased endurance, decreased mobility, difficulty walking, decreased ROM, decreased strength, improper body mechanics, obesity, and pain.   ACTIVITY LIMITATIONS: bending, sitting, standing, squatting, stairs, transfers, bed mobility, and locomotion level  PERSONAL FACTORS: Age, Fitness, Past/current experiences, Time since onset of injury/illness/exacerbation, and 1 comorbidity: obesity  are also affecting patient's functional outcome.   REHAB POTENTIAL: Fair based on body habitus, chronicity and underlying degenerative changes  CLINICAL DECISION  MAKING: Stable/uncomplicated  EVALUATION COMPLEXITY: Low   GOALS: Goals reviewed with patient? No  SHORT TERM GOALS=LONG TERM GOALS: Target date: 04/13/2023   Patient to demonstrate independence in HEP  Baseline: [id number] Goal status: INITIAL  2.  Increase 30s chair stand test to 1 rep Baseline: 0 reps Goal status: INITIAL  3.  Increase distance on 2 MWT to 225ft+ Baseline: 286ft with RW Goal status: INITIAL  4.  Increase BLE strength to 4-/5 Baseline:  MMT Right eval Left eval  Hip flexion 3+ 3+  Hip extension 3+ 3+  Hip abduction 3+ 3+  Hip adduction    Hip internal rotation    Hip external rotation    Knee flexion 3+ 3+  Knee extension 3+ 3+  Ankle dorsiflexion    Ankle plantarflexion 3+ 3+   Goal status: INITIAL      PLAN:  PT FREQUENCY: 2x/week  PT DURATION: 4 weeks  PLANNED INTERVENTIONS: Therapeutic exercises, Therapeutic activity, Neuromuscular re-education, Balance training, Gait training, Patient/Family education, Self Care, Joint mobilization, Stair training, DME instructions, Aquatic Therapy, Electrical stimulation, Cryotherapy, Moist heat, Manual therapy, and Re-evaluation  PLAN FOR NEXT SESSION: HEP review and update, manual techniques as appropriate, aerobic tasks, ROM and flexibility activities, strengthening and PREs, TPDN, gait and balance training as needed     Berta Minor, PTA 03/28/2023, 12:10 PM   Referring diagnosis? B hip pain Treatment diagnosis? (if different than referring diagnosis) B OA of hips What was this (referring dx) caused by? []  Surgery []  Fall []  Ongoing issue [x]  Arthritis []  Other: ____________  Laterality: []  Rt []  Lt [x]  Both  Check all possible CPT codes:  *CHOOSE 10 OR LESS*    [x]  97110 (Therapeutic Exercise)  []  92507 (SLP Treatment)  [x]  69629 (  Neuro Re-ed)   []  92526 (Swallowing Treatment)   [x]  [id number] (Gait Training)   []  [phone] (Cognitive Training, 1st 15 minutes) [x]  97140 (Manual  Therapy)   []  97130 (Cognitive Training, each add'l 15 minutes)  [x]  97164 (Re-evaluation)                              []  Other, List CPT Code ____________  [x]  97530 (Therapeutic Activities)     [x]  97535 (Self Care)   [x]  All codes above (97110 - 97535)  []  97012 (Mechanical Traction)  []  97014 (E-stim Unattended)  []  97032 (E-stim manual)  []  97033 (Ionto)  []  97035 (Ultrasound) []  97750 (Physical Performance Training) []  [id number] (Aquatic Therapy) []  97016 (Vasopneumatic Device) []  [id number] (Paraffin) []  97034 (Contrast Bath) []  97597 (Wound Care 1st 20 sq cm) []  97598 (Wound Care each add'l 20 sq cm) []  97760 (Orthotic Fabrication, Fitting, Training Initial) []  [id number] (Prosthetic Management and Training Initial) []  [id number] (Orthotic or Prosthetic Training/ Modification Subsequent)

## 2023-03-30 ENCOUNTER — Ambulatory Visit: Payer: Medicare HMO

## 2023-03-30 DIAGNOSIS — R2689 Other abnormalities of gait and mobility: Secondary | ICD-10-CM | POA: Diagnosis not present

## 2023-03-30 DIAGNOSIS — M25552 Pain in left hip: Secondary | ICD-10-CM | POA: Diagnosis not present

## 2023-03-30 DIAGNOSIS — M6281 Muscle weakness (generalized): Secondary | ICD-10-CM

## 2023-03-30 DIAGNOSIS — Z903 Acquired absence of stomach [part of]: Secondary | ICD-10-CM | POA: Diagnosis not present

## 2023-03-30 DIAGNOSIS — M25551 Pain in right hip: Secondary | ICD-10-CM | POA: Diagnosis not present

## 2023-03-30 DIAGNOSIS — R6889 Other general symptoms and signs: Secondary | ICD-10-CM | POA: Diagnosis not present

## 2023-04-03 ENCOUNTER — Ambulatory Visit: Payer: Medicare HMO

## 2023-04-03 DIAGNOSIS — M25551 Pain in right hip: Secondary | ICD-10-CM

## 2023-04-03 DIAGNOSIS — M6281 Muscle weakness (generalized): Secondary | ICD-10-CM | POA: Diagnosis not present

## 2023-04-03 DIAGNOSIS — M25552 Pain in left hip: Secondary | ICD-10-CM | POA: Diagnosis not present

## 2023-04-03 DIAGNOSIS — R2689 Other abnormalities of gait and mobility: Secondary | ICD-10-CM | POA: Diagnosis not present

## 2023-04-03 NOTE — Therapy (Signed)
OUTPATIENT PHYSICAL THERAPY TREATMENT NOTE   Patient Name: Michael Payne MRN: [id number] DOB:Mar 25, 1971, 52 y.o., male Today's Date: 04/03/2023  END OF SESSION:  PT End of Session - 04/03/23 1430     Visit Number 4    Number of Visits 8    Date for PT Re-Evaluation 05/11/23    Authorization Type [employer] MCR    PT Start Time 1445    PT Stop Time 1525    PT Time Calculation (min) 40 min    Activity Tolerance Patient tolerated treatment well    Behavior During Therapy WFL for tasks assessed/performed               Past Medical History:  Diagnosis Date   Anemia    borderline anemia, was instructed to eat more iron filled foods   Cardiac enlargement 06/03/2022   Noted on Xray   CHF (congestive heart failure) (HCC) 05/2021   GERD (gastroesophageal reflux disease)    lactose intolerance, patient denies symptoms of reflux   Hip pain    History of kidney stones    HTN (hypertension)    Morbid obesity (HCC)    Obesity    Pneumonia 05/2021   Pre-diabetes    Sleep apnea    Past Surgical History:  Procedure Laterality Date   FOOT SURGERY Right    KNEE ARTHROSCOPY WITH MEDIAL MENISECTOMY Left 12/16/2014   Procedure: KNEE ARTHROSCOPY WITH MEDIAL MENISECTOMY;  Surgeon: Sheral Apley, MD;  Location: [facility];  Service: Orthopedics;  Laterality: Left;   UPPER GI ENDOSCOPY N/A 06/20/2022   Procedure: UPPER GI ENDOSCOPY;  Surgeon: Quentin Ore, MD;  Location: [facility];  Service: General;  Laterality: N/A;   Patient Active Problem List   Diagnosis Date Noted   Advance care planning 01/29/2023   Skin lesion 01/29/2023   Chronic pain 01/29/2023   Hyperglycemia 11/01/2022   Morbid obesity with BMI of 60.0-69.9, adult (HCC) 06/20/2022   Right shoulder pain 05/11/2022   Hordeolum externum of right eye 02/18/2022   Nonobstructive transaminitis 06/28/2021   Decubitus ulcer of posterior thigh, stage 2 (HCC) 06/28/2021   Obesity, Class III, BMI 40-49.9 (morbid obesity) (HCC)  06/10/2021   Osteoarthritis 06/10/2021   Pressure injury of skin 06/09/2021   Respiratory failure (HCC) 06/01/2021   Acute diastolic CHF (congestive heart failure) (HCC) 05/31/2021   CHF (congestive heart failure) (HCC) 05/31/2021   Hypoxia 05/31/2021   Gout, unspecified 03/10/2021   Edema 03/10/2021   Muscle spasms of both lower extremities 07/29/2020   Bedbound 05/22/2020   Unilateral osteoarthritis of hip, right 12/27/2019   Severe obstructive sleep apnea 02/22/2018   Hypertriglyceridemia 08/24/2017   Low HDL (under 40) 08/24/2017   Metabolic syndrome 08/24/2017   Morbid obesity (HCC) 10/14/2013   HTN (hypertension) 10/14/2013    PCP: Joaquim Nam, MD  REFERRING PROVIDER: Joaquim Nam, MD  REFERRING DIAG: M16.0 (ICD-10-CM) - Osteoarthritis of both hips, unspecified osteoarthritis type  THERAPY DIAG:  Pain of both hip joints  Other abnormalities of gait and mobility  Muscle weakness (generalized)  Rationale for Evaluation and Treatment: Rehabilitation  ONSET DATE: chronic  SUBJECTIVE:   SUBJECTIVE STATEMENT: Pt presetns to PT with reports of continued bilateral knee hip/knee pain. Has been compliant with HEP.   PERTINENT HISTORY:  See PMH  PAIN:  Are you having pain?  Yes: NPRS scale: 5/10 Pain location: B hips Pain description: ache Aggravating factors: activity Relieving factors: medication  PRECAUTIONS: None  WEIGHT BEARING RESTRICTIONS:  No  FALLS:  Has patient fallen in last 6 months? No  LIVING ENVIRONMENT: Lives with: lives with their daughter Lives in: House/apartment Stairs:  5 Has following equipment at home: Environmental consultant - 2 wheeled  OCCUPATION: not working  PLOF: Independent  PATIENT GOALS: To manage my hip pain  OBJECTIVE:   DIAGNOSTIC FINDINGS: none  PATIENT SURVEYS:  FOTO: 43(60 predicted)  MUSCLE LENGTH: Hamstrings: Right 45 deg; Left 45 deg (measured in seated)  POSTURE:  unable to assess due to body  habitus  PALPATION: Deferred due to body habitus  LOWER EXTREMITY ROM:  ROM Right eval Left eval  Hip flexion    Hip extension    Hip abduction    Hip adduction    Hip internal rotation    Hip external rotation    Knee flexion    Knee extension    Ankle dorsiflexion    Ankle plantarflexion    Ankle inversion    Ankle eversion     (Blank rows = not tested)  LOWER EXTREMITY MMT:  MMT Right eval Left eval  Hip flexion 3+ 3+  Hip extension 3+ 3+  Hip abduction 3+ 3+  Hip adduction    Hip internal rotation    Hip external rotation    Knee flexion 3+ 3+  Knee extension 3+ 3+  Ankle dorsiflexion    Ankle plantarflexion 3+ 3+  Ankle inversion    Ankle eversion     (Blank rows = not tested)  LOWER EXTREMITY SPECIAL TESTS:  UTA due to body habitus  FUNCTIONAL TESTS:  30 seconds chair stand test 0 reps 2 MWT 216ft with RW  GAIT: Distance walked: 55ftx2 Assistive device utilized: Environmental consultant - 2 wheeled Level of assistance: Complete Independence Comments: slow cadence, flexed poture   TODAY'S TREATMENT:        [facility] Adult PT Treatment:                                                DATE: 04/03/23 Therapeutic Exercise: LAQ 2x10 3# each FAQ x 15 3# Seated ball squeeze 2x10 - 5" hold STS 3x5 370 ft ambulation with rollator in 3.5 min Seated hamstring curl 2x15 black band Supine clamshell 3x20 black band Supine SLR 2x10 Lateral walk RTB x 3 laps HR and O2 sats monitored remaining within normative ranges  [facility] Adult PT Treatment:                                                DATE: 03/30/23 Therapeutic Exercise: 370 ft ambulation with rollator in 4 min Supine SLR 15/15 Supine hip fallouts BluTB 15x B, 15/15 unilaterally Supine march 3# 15/15 alt. FAQs 3# 15/15 alt. Seated hip tosses 3000g ball 15/15 Seated chops 3000g ball 15/15 Standing heel/toe 15x HR and O2 sats monitored remaining within normative ranges   [facility] Adult PT Treatment:                                                 DATE: 03/28/23 Therapeutic Exercise: Seated marching x30" Standing hamstring curl 2x10 3# BIL Seated LAQ 3#  x25 BIL Gait rolled into therex x185' Standing heel raises 2x10 (standing break btw sets) Standing marching 3x30" STS from elevated table 57 cm x5                                                                                                                     DATE: 03/13/23 Eval and HEP    PATIENT EDUCATION:  Education details: Discussed eval findings, rehab rationale and POC and patient is in agreement  Person educated: Patient Education method: Explanation Education comprehension: verbalized understanding and needs further education  HOME EXERCISE PROGRAM: Access Code: [email or web address] URL: [email or web address] Date: 03/16/2023 Prepared by: Gustavus Bryant  Exercises - Seated Hamstring Stretch  - 2 x daily - 5 x weekly - 1 sets - 2 reps - 30s hold - Standing Heel Raise with Support  - 2 x daily - 5 x weekly - 2 sets - 15 reps - Bilateral Long Arc Quad  - 2 x daily - 5 x weekly - 2 sets - 15 reps  ASSESSMENT:  CLINICAL IMPRESSION: Pt was able to complete all prescribed exercises with no adverse effect, did note fatigue post session. Therapy focused on improving overall strength and endurance. He is progressing well with therapy, will continue per POC.   Patient presents to first follow up PT session reporting continued BIL hip pain and HEP compliance. Session today focused on LE strengthening and increasing standing activity tolerance as patient is able. He needed frequent rest breaks from standing exercises, but is able to complete all exercises. He is interested in performing more standing exercises than seated as he has already done [facility] with mostly seated exercises and wants to gain strength and standing activity tolerance. Patient continues to benefit from skilled PT services and should be progressed as able to improve functional independence.    OBJECTIVE IMPAIRMENTS: Abnormal gait, decreased activity tolerance, decreased endurance, decreased mobility, difficulty walking, decreased ROM, decreased strength, improper body mechanics, obesity, and pain.   ACTIVITY LIMITATIONS: bending, sitting, standing, squatting, stairs, transfers, bed mobility, and locomotion level  PERSONAL FACTORS: Age, Fitness, Past/current experiences, Time since onset of injury/illness/exacerbation, and 1 comorbidity: obesity  are also affecting patient's functional outcome.   REHAB POTENTIAL: Fair based on body habitus, chronicity and underlying degenerative changes  CLINICAL DECISION MAKING: Stable/uncomplicated  EVALUATION COMPLEXITY: Low   GOALS: Goals reviewed with patient? No  SHORT TERM GOALS=LONG TERM GOALS: Target date: 04/13/2023   Patient to demonstrate independence in HEP  Baseline: [id number] Goal status: INITIAL  2.  Increase 30s chair stand test to 1 rep Baseline: 0 reps Goal status: INITIAL  3.  Increase distance on 2 MWT to 287ft+ Baseline: 213ft with RW Goal status: INITIAL  4.  Increase BLE strength to 4-/5 Baseline:  MMT Right eval Left eval  Hip flexion 3+ 3+  Hip extension 3+ 3+  Hip abduction 3+ 3+  Hip adduction    Hip internal rotation    Hip external rotation    Knee flexion 3+ 3+  Knee extension 3+ 3+  Ankle dorsiflexion    Ankle plantarflexion 3+ 3+   Goal status: INITIAL      PLAN:  PT FREQUENCY: 2x/week  PT DURATION: 4 weeks  PLANNED INTERVENTIONS: Therapeutic exercises, Therapeutic activity, Neuromuscular re-education, Balance training, Gait training, Patient/Family education, Self Care, Joint mobilization, Stair training, DME instructions, Aquatic Therapy, Electrical stimulation, Cryotherapy, Moist heat, Manual therapy, and Re-evaluation  PLAN FOR NEXT SESSION: HEP review and update, manual techniques as appropriate, aerobic tasks, ROM and flexibility activities, strengthening and PREs, TPDN,  gait and balance training as needed     Eloy End, PT 04/03/2023, 3:46 PM   Referring diagnosis? B hip pain Treatment diagnosis? (if different than referring diagnosis) B OA of hips What was this (referring dx) caused by? []  Surgery []  Fall []  Ongoing issue [x]  Arthritis []  Other: ____________  Laterality: []  Rt []  Lt [x]  Both  Check all possible CPT codes:  *CHOOSE 10 OR LESS*    [x]  97110 (Therapeutic Exercise)  []  92507 (SLP Treatment)  [x]  97112 (Neuro Re-ed)   []  65784 (Swallowing Treatment)   [x]  97116 (Gait Training)   []  [id number] (Cognitive Training, 1st 15 minutes) [x]  97140 (Manual Therapy)   []  97130 (Cognitive Training, each add'l 15 minutes)  [x]  97164 (Re-evaluation)                              []  Other, List CPT Code ____________  [x]  97530 (Therapeutic Activities)     [x]  97535 (Self Care)   [x]  All codes above (97110 - 97535)  []  97012 (Mechanical Traction)  []  97014 (E-stim Unattended)  []  97032 (E-stim manual)  []  97033 (Ionto)  []  97035 (Ultrasound) []  97750 (Physical Performance Training) []  [id number] (Aquatic Therapy) []  97016 (Vasopneumatic Device) []  [id number] (Paraffin) []  97034 (Contrast Bath) []  97597 (Wound Care 1st 20 sq cm) []  97598 (Wound Care each add'l 20 sq cm) []  97760 (Orthotic Fabrication, Fitting, Training Initial) []  [id number] (Prosthetic Management and Training Initial) []  [id number] (Orthotic or Prosthetic Training/ Modification Subsequent)

## 2023-04-04 NOTE — Therapy (Unsigned)
OUTPATIENT PHYSICAL THERAPY TREATMENT NOTE   Patient Name: Michael Payne MRN: [id number] DOB:Nov 13, 1970, 52 y.o., male Today's Date: 04/05/2023  END OF SESSION:  PT End of Session - 04/05/23 1128     Visit Number 5    Number of Visits 8    Date for PT Re-Evaluation 05/11/23    Authorization Type [employer] MCR    PT Start Time 1130    PT Stop Time 1210    PT Time Calculation (min) 40 min    Activity Tolerance Patient tolerated treatment well    Behavior During Therapy WFL for tasks assessed/performed                Past Medical History:  Diagnosis Date   Anemia    borderline anemia, was instructed to eat more iron filled foods   Cardiac enlargement 06/03/2022   Noted on Xray   CHF (congestive heart failure) (HCC) 05/2021   GERD (gastroesophageal reflux disease)    lactose intolerance, patient denies symptoms of reflux   Hip pain    History of kidney stones    HTN (hypertension)    Morbid obesity (HCC)    Obesity    Pneumonia 05/2021   Pre-diabetes    Sleep apnea    Past Surgical History:  Procedure Laterality Date   FOOT SURGERY Right    KNEE ARTHROSCOPY WITH MEDIAL MENISECTOMY Left 12/16/2014   Procedure: KNEE ARTHROSCOPY WITH MEDIAL MENISECTOMY;  Surgeon: Sheral Apley, MD;  Location: [facility];  Service: Orthopedics;  Laterality: Left;   UPPER GI ENDOSCOPY N/A 06/20/2022   Procedure: UPPER GI ENDOSCOPY;  Surgeon: Quentin Ore, MD;  Location: [facility];  Service: General;  Laterality: N/A;   Patient Active Problem List   Diagnosis Date Noted   Advance care planning 01/29/2023   Skin lesion 01/29/2023   Chronic pain 01/29/2023   Hyperglycemia 11/01/2022   Morbid obesity with BMI of 60.0-69.9, adult (HCC) 06/20/2022   Right shoulder pain 05/11/2022   Hordeolum externum of right eye 02/18/2022   Nonobstructive transaminitis 06/28/2021   Decubitus ulcer of posterior thigh, stage 2 (HCC) 06/28/2021   Obesity, Class III, BMI 40-49.9 (morbid obesity)  (HCC) 06/10/2021   Osteoarthritis 06/10/2021   Pressure injury of skin 06/09/2021   Respiratory failure (HCC) 06/01/2021   Acute diastolic CHF (congestive heart failure) (HCC) 05/31/2021   CHF (congestive heart failure) (HCC) 05/31/2021   Hypoxia 05/31/2021   Gout, unspecified 03/10/2021   Edema 03/10/2021   Muscle spasms of both lower extremities 07/29/2020   Bedbound 05/22/2020   Unilateral osteoarthritis of hip, right 12/27/2019   Severe obstructive sleep apnea 02/22/2018   Hypertriglyceridemia 08/24/2017   Low HDL (under 40) 08/24/2017   Metabolic syndrome 08/24/2017   Morbid obesity (HCC) 10/14/2013   HTN (hypertension) 10/14/2013    PCP: Joaquim Nam, MD  REFERRING PROVIDER: Joaquim Nam, MD  REFERRING DIAG: M16.0 (ICD-10-CM) - Osteoarthritis of both hips, unspecified osteoarthritis type  THERAPY DIAG:  Pain of both hip joints  Other abnormalities of gait and mobility  Muscle weakness (generalized)  Rationale for Evaluation and Treatment: Rehabilitation  ONSET DATE: chronic  SUBJECTIVE:   SUBJECTIVE STATEMENT: Exercised this AM and feels a bit worn out already.  Does not feel he can ambulate w/o rest breaks.  PERTINENT HISTORY:  See PMH  PAIN:  Are you having pain?  Yes: NPRS scale: 5/10 Pain location: B hips Pain description: ache Aggravating factors: activity Relieving factors: medication  PRECAUTIONS: None  WEIGHT BEARING RESTRICTIONS: No  FALLS:  Has patient fallen in last 6 months? No  LIVING ENVIRONMENT: Lives with: lives with their daughter Lives in: House/apartment Stairs:  5 Has following equipment at home: Environmental consultant - 2 wheeled  OCCUPATION: not working  PLOF: Independent  PATIENT GOALS: To manage my hip pain  OBJECTIVE:   DIAGNOSTIC FINDINGS: none  PATIENT SURVEYS:  FOTO: 43(60 predicted)  MUSCLE LENGTH: Hamstrings: Right 45 deg; Left 45 deg (measured in seated)  POSTURE:  unable to assess due to body  habitus  PALPATION: Deferred due to body habitus  LOWER EXTREMITY ROM:  ROM Right eval Left eval  Hip flexion    Hip extension    Hip abduction    Hip adduction    Hip internal rotation    Hip external rotation    Knee flexion    Knee extension    Ankle dorsiflexion    Ankle plantarflexion    Ankle inversion    Ankle eversion     (Blank rows = not tested)  LOWER EXTREMITY MMT:  MMT Right eval Left eval  Hip flexion 3+ 3+  Hip extension 3+ 3+  Hip abduction 3+ 3+  Hip adduction    Hip internal rotation    Hip external rotation    Knee flexion 3+ 3+  Knee extension 3+ 3+  Ankle dorsiflexion    Ankle plantarflexion 3+ 3+  Ankle inversion    Ankle eversion     (Blank rows = not tested)  LOWER EXTREMITY SPECIAL TESTS:  UTA due to body habitus  FUNCTIONAL TESTS:  30 seconds chair stand test 0 reps 2 MWT 2105ft with RW  GAIT: Distance walked: 85ftx2 Assistive device utilized: Environmental consultant - 2 wheeled Level of assistance: Complete Independence Comments: slow cadence, flexed poture   TODAY'S TREATMENT:        [facility] Adult PT Treatment:                                                DATE: 04/05/23 Therapeutic Exercise: 355ft ambulation in 4\' 20"  with rollator 73m walk test 17s (1.8 m/s) FAQs 5# 3x15 B  Supine march 5# 2x15 alternating Supine hip fallouts BlaTB 15x B, 15/15 unilaterally S/L clamshells BlaTB  15/15 STS with 15# DB 10x from elevated table Standing hamstring curls 15x2 B alternating sets  [facility] Adult PT Treatment:                                                DATE: 04/03/23 Therapeutic Exercise: LAQ 2x10 3# each FAQ x 15 3# Seated ball squeeze 2x10 - 5" hold STS 3x5 370 ft ambulation with rollator in 3.5 min Seated hamstring curl 2x15 black band Supine clamshell 3x20 black band Supine SLR 2x10 Lateral walk RTB x 3 laps HR and O2 sats monitored remaining within normative ranges  [facility] Adult PT Treatment:                                                 DATE: 03/30/23 Therapeutic Exercise: 370 ft ambulation with rollator in 4 min Supine SLR  15/15 Supine hip fallouts BluTB 15x B, 15/15 unilaterally Supine march 3# 15/15 alt. FAQs 3# 15/15 alt. Seated hip tosses 3000g ball 15/15 Seated chops 3000g ball 15/15 Standing heel/toe 15x HR and O2 sats monitored remaining within normative ranges   [facility] Adult PT Treatment:                                                DATE: 03/28/23 Therapeutic Exercise: Seated marching x30" Standing hamstring curl 2x10 3# BIL Seated LAQ 3# x25 BIL Gait rolled into therex x185' Standing heel raises 2x10 (standing break btw sets) Standing marching 3x30" STS from elevated table 57 cm x5                                                                                                                     DATE: 03/13/23 Eval and HEP    PATIENT EDUCATION:  Education details: Discussed eval findings, rehab rationale and POC and patient is in agreement  Person educated: Patient Education method: Explanation Education comprehension: verbalized understanding and needs further education  HOME EXERCISE PROGRAM: Access Code: [email or web address] URL: [email or web address] Date: 03/16/2023 Prepared by: Gustavus Bryant  Exercises - Seated Hamstring Stretch  - 2 x daily - 5 x weekly - 1 sets - 2 reps - 30s hold - Standing Heel Raise with Support  - 2 x daily - 5 x weekly - 2 sets - 15 reps - Bilateral Long Arc Quad  - 2 x daily - 5 x weekly - 2 sets - 15 reps  ASSESSMENT:  CLINICAL IMPRESSION: Ambulation time increased a bit today.  Performed 70m walk test with 17s time registered.  Added S/L clams for hip strength.  Increased weight and resistance as noted.  Able to tolerate all added tasks w/o setback.  Patient presents to first follow up PT session reporting continued BIL hip pain and HEP compliance. Session today focused on LE strengthening and increasing standing activity tolerance as patient is able. He  needed frequent rest breaks from standing exercises, but is able to complete all exercises. He is interested in performing more standing exercises than seated as he has already done [facility] with mostly seated exercises and wants to gain strength and standing activity tolerance. Patient continues to benefit from skilled PT services and should be progressed as able to improve functional independence.   OBJECTIVE IMPAIRMENTS: Abnormal gait, decreased activity tolerance, decreased endurance, decreased mobility, difficulty walking, decreased ROM, decreased strength, improper body mechanics, obesity, and pain.   ACTIVITY LIMITATIONS: bending, sitting, standing, squatting, stairs, transfers, bed mobility, and locomotion level  PERSONAL FACTORS: Age, Fitness, Past/current experiences, Time since onset of injury/illness/exacerbation, and 1 comorbidity: obesity  are also affecting patient's functional outcome.   REHAB POTENTIAL: Fair based on body habitus, chronicity and underlying degenerative changes  CLINICAL DECISION MAKING: Stable/uncomplicated  EVALUATION COMPLEXITY: Low   GOALS: Goals reviewed  with patient? No  SHORT TERM GOALS=LONG TERM GOALS: Target date: 04/13/2023   Patient to demonstrate independence in HEP  Baseline: [id number] Goal status: INITIAL  2.  Increase 30s chair stand test to 1 rep Baseline: 0 reps Goal status: INITIAL  3.  Increase distance on 2 MWT to 217ft+ Baseline: 244ft with RW Goal status: INITIAL  4.  Increase BLE strength to 4-/5 Baseline:  MMT Right eval Left eval  Hip flexion 3+ 3+  Hip extension 3+ 3+  Hip abduction 3+ 3+  Hip adduction    Hip internal rotation    Hip external rotation    Knee flexion 3+ 3+  Knee extension 3+ 3+  Ankle dorsiflexion    Ankle plantarflexion 3+ 3+   Goal status: INITIAL      PLAN:  PT FREQUENCY: 2x/week  PT DURATION: 4 weeks  PLANNED INTERVENTIONS: Therapeutic exercises, Therapeutic activity, Neuromuscular  re-education, Balance training, Gait training, Patient/Family education, Self Care, Joint mobilization, Stair training, DME instructions, Aquatic Therapy, Electrical stimulation, Cryotherapy, Moist heat, Manual therapy, and Re-evaluation  PLAN FOR NEXT SESSION: HEP review and update, manual techniques as appropriate, aerobic tasks, ROM and flexibility activities, strengthening and PREs, TPDN, gait and balance training as needed     Hildred Laser, PT 04/05/2023, 12:10 PM   Referring diagnosis? B hip pain Treatment diagnosis? (if different than referring diagnosis) B OA of hips What was this (referring dx) caused by? []  Surgery []  Fall []  Ongoing issue [x]  Arthritis []  Other: ____________  Laterality: []  Rt []  Lt [x]  Both  Check all possible CPT codes:  *CHOOSE 10 OR LESS*    [x]  97110 (Therapeutic Exercise)  []  92507 (SLP Treatment)  [x]  97112 (Neuro Re-ed)   []  92526 (Swallowing Treatment)   [x]  97116 (Gait Training)   []  [id number] (Cognitive Training, 1st 15 minutes) [x]  97140 (Manual Therapy)   []  97130 (Cognitive Training, each add'l 15 minutes)  [x]  97164 (Re-evaluation)                              []  Other, List CPT Code ____________  [x]  97530 (Therapeutic Activities)     [x]  97535 (Self Care)   [x]  All codes above (97110 - 97535)  []  97012 (Mechanical Traction)  []  97014 (E-stim Unattended)  []  97032 (E-stim manual)  []  97033 (Ionto)  []  97035 (Ultrasound) []  97750 (Physical Performance Training) []  [id number] (Aquatic Therapy) []  97016 (Vasopneumatic Device) []  [id number] (Paraffin) []  97034 (Contrast Bath) []  97597 (Wound Care 1st 20 sq cm) []  97598 (Wound Care each add'l 20 sq cm) []  97760 (Orthotic Fabrication, Fitting, Training Initial) []  [id number] (Prosthetic Management and Training Initial) []  [id number] (Orthotic or Prosthetic Training/ Modification Subsequent)

## 2023-04-05 ENCOUNTER — Ambulatory Visit: Payer: Medicare HMO

## 2023-04-05 DIAGNOSIS — R2689 Other abnormalities of gait and mobility: Secondary | ICD-10-CM

## 2023-04-05 DIAGNOSIS — M25551 Pain in right hip: Secondary | ICD-10-CM | POA: Diagnosis not present

## 2023-04-05 DIAGNOSIS — M25552 Pain in left hip: Secondary | ICD-10-CM | POA: Diagnosis not present

## 2023-04-05 DIAGNOSIS — M6281 Muscle weakness (generalized): Secondary | ICD-10-CM

## 2023-04-06 NOTE — Therapy (Unsigned)
OUTPATIENT PHYSICAL THERAPY TREATMENT NOTE   Patient Name: LANG ZINGG MRN: [id number] DOB:[birth year], 52 y.o., male Today's Date: 04/10/2023  END OF SESSION:  PT End of Session - 04/10/23 1137     Visit Number 6    Number of Visits 8    Date for PT Re-Evaluation 05/11/23    Authorization Type [employer] MCR    PT Start Time 1130    PT Stop Time 1210    PT Time Calculation (min) 40 min    Activity Tolerance Patient tolerated treatment well    Behavior During Therapy WFL for tasks assessed/performed                 Past Medical History:  Diagnosis Date   Anemia    borderline anemia, was instructed to eat more iron filled foods   Cardiac enlargement 06/03/2022   Noted on Xray   CHF (congestive heart failure) (HCC) 05/2021   GERD (gastroesophageal reflux disease)    lactose intolerance, patient denies symptoms of reflux   Hip pain    History of kidney stones    HTN (hypertension)    Morbid obesity (HCC)    Obesity    Pneumonia 05/2021   Pre-diabetes    Sleep apnea    Past Surgical History:  Procedure Laterality Date   FOOT SURGERY Right    KNEE ARTHROSCOPY WITH MEDIAL MENISECTOMY Left 12/16/2014   Procedure: KNEE ARTHROSCOPY WITH MEDIAL MENISECTOMY;  Surgeon: Sheral Apley, MD;  Location: [facility] OR;  Service: Orthopedics;  Laterality: Left;   UPPER GI ENDOSCOPY N/A 06/20/2022   Procedure: UPPER GI ENDOSCOPY;  Surgeon: Quentin Ore, MD;  Location: [facility];  Service: General;  Laterality: N/A;   Patient Active Problem List   Diagnosis Date Noted   Advance care planning 01/29/2023   Skin lesion 01/29/2023   Chronic pain 01/29/2023   Hyperglycemia 11/01/2022   Morbid obesity with BMI of 60.0-69.9, adult (HCC) 06/20/2022   Right shoulder pain 05/11/2022   Hordeolum externum of right eye 02/18/2022   Nonobstructive transaminitis 06/28/2021   Decubitus ulcer of posterior thigh, stage 2 (HCC) 06/28/2021   Obesity, Class III, BMI 40-49.9 (morbid obesity)  (HCC) 06/10/2021   Osteoarthritis 06/10/2021   Pressure injury of skin 06/09/2021   Respiratory failure (HCC) 06/01/2021   Acute diastolic CHF (congestive heart failure) (HCC) 05/31/2021   CHF (congestive heart failure) (HCC) 05/31/2021   Hypoxia 05/31/2021   Gout, unspecified 03/10/2021   Edema 03/10/2021   Muscle spasms of both lower extremities 07/29/2020   Bedbound 05/22/2020   Unilateral osteoarthritis of hip, right 12/27/2019   Severe obstructive sleep apnea 02/22/2018   Hypertriglyceridemia 08/24/2017   Low HDL (under 40) 08/24/2017   Metabolic syndrome 08/24/2017   Morbid obesity (HCC) 10/14/2013   HTN (hypertension) 10/14/2013    PCP: Joaquim Nam, MD  REFERRING PROVIDER: Joaquim Nam, MD  REFERRING DIAG: M16.0 (ICD-10-CM) - Osteoarthritis of both hips, unspecified osteoarthritis type  THERAPY DIAG:  Pain of both hip joints  Other abnormalities of gait and mobility  Muscle weakness (generalized)  Rationale for Evaluation and Treatment: Rehabilitation  ONSET DATE: chronic  SUBJECTIVE:   SUBJECTIVE STATEMENT: Sore from yesterday's workout at gym.  PERTINENT HISTORY:  See PMH  PAIN:  Are you having pain?  Yes: NPRS scale: 5/10 Pain location: B hips Pain description: ache Aggravating factors: activity Relieving factors: medication  PRECAUTIONS: None  WEIGHT BEARING RESTRICTIONS: No  FALLS:  Has patient fallen in last 6  months? No  LIVING ENVIRONMENT: Lives with: lives with their daughter Lives in: House/apartment Stairs:  5 Has following equipment at home: Environmental consultant - 2 wheeled  OCCUPATION: not working  PLOF: Independent  PATIENT GOALS: To manage my hip pain  OBJECTIVE:   DIAGNOSTIC FINDINGS: none  PATIENT SURVEYS:  FOTO: 43(60 predicted); 04/10/23 47  MUSCLE LENGTH: Hamstrings: Right 45 deg; Left 45 deg (measured in seated)  POSTURE:  unable to assess due to body habitus  PALPATION: Deferred due to body habitus  LOWER  EXTREMITY ROM:  ROM Right eval Left eval  Hip flexion    Hip extension    Hip abduction    Hip adduction    Hip internal rotation    Hip external rotation    Knee flexion    Knee extension    Ankle dorsiflexion    Ankle plantarflexion    Ankle inversion    Ankle eversion     (Blank rows = not tested)  LOWER EXTREMITY MMT:  MMT Right eval Left eval  Hip flexion 3+ 3+  Hip extension 3+ 3+  Hip abduction 3+ 3+  Hip adduction    Hip internal rotation    Hip external rotation    Knee flexion 3+ 3+  Knee extension 3+ 3+  Ankle dorsiflexion    Ankle plantarflexion 3+ 3+  Ankle inversion    Ankle eversion     (Blank rows = not tested)  LOWER EXTREMITY SPECIAL TESTS:  UTA due to body habitus  FUNCTIONAL TESTS:  30 seconds chair stand test 0 reps; 04/10/23 0 reps 2 MWT 290ft with RW; 04/10/23 212ft  GAIT: Distance walked: 22ftx2 Assistive device utilized: Environmental consultant - 2 wheeled Level of assistance: Complete Independence Comments: slow cadence, flexed poture   TODAY'S TREATMENT:        [facility] Adult PT Treatment:                                                DATE: 04/10/23 Therapeutic Exercise: 30s chair stand test 0 reps FAQs 5# 3x15 B  Supine march 5# 15/15 Supine SLR 5# 10/10 Supine hip fallouts BlaTB 15x B, 15/15 unilaterally S/L clamshells BlaTB  15/15 Standing hamstring curls 5# 15x B alternating sets Therapeutic Activity: 2 MWT 215 ft with rollator FOTO re-assessment  [facility] Adult PT Treatment:                                                DATE: 04/05/23 Therapeutic Exercise: 338ft ambulation in 4\' 20"  with rollator 98m walk test 17s (1.8 m/s) FAQs 5# 3x15 B  Supine march 5# 2x15 alternating Supine hip fallouts BlaTB 15x B, 15/15 unilaterally S/L clamshells BlaTB  15/15 STS with 15# DB 10x from elevated table Standing hamstring curls 15x2 B alternating sets  [facility] Adult PT Treatment:                                                DATE: 04/03/23 Therapeutic  Exercise: LAQ 2x10 3# each FAQ x 15 3# Seated ball squeeze 2x10 - 5" hold STS 3x5 370 ft ambulation  with rollator in 3.5 min Seated hamstring curl 2x15 black band Supine clamshell 3x20 black band Supine SLR 2x10 Lateral walk RTB x 3 laps HR and O2 sats monitored remaining within normative ranges  [facility] Adult PT Treatment:                                                DATE: 03/30/23 Therapeutic Exercise: 370 ft ambulation with rollator in 4 min Supine SLR 15/15 Supine hip fallouts BluTB 15x B, 15/15 unilaterally Supine march 3# 15/15 alt. FAQs 3# 15/15 alt. Seated hip tosses 3000g ball 15/15 Seated chops 3000g ball 15/15 Standing heel/toe 15x HR and O2 sats monitored remaining within normative ranges   [facility] Adult PT Treatment:                                                DATE: 03/28/23 Therapeutic Exercise: Seated marching x30" Standing hamstring curl 2x10 3# BIL Seated LAQ 3# x25 BIL Gait rolled into therex x185' Standing heel raises 2x10 (standing break btw sets) Standing marching 3x30" STS from elevated table 57 cm x5                                                                                                                     DATE: 03/13/23 Eval and HEP    PATIENT EDUCATION:  Education details: Discussed eval findings, rehab rationale and POC and patient is in agreement  Person educated: Patient Education method: Explanation Education comprehension: verbalized understanding and needs further education  HOME EXERCISE PROGRAM: Access Code: [email or web address] URL: [email or web address] Date: 03/16/2023 Prepared by: Gustavus Bryant  Exercises - Seated Hamstring Stretch  - 2 x daily - 5 x weekly - 1 sets - 2 reps - 30s hold - Standing Heel Raise with Support  - 2 x daily - 5 x weekly - 2 sets - 15 reps - Bilateral Long Arc Quad  - 2 x daily - 5 x weekly - 2 sets - 15 reps  ASSESSMENT:  CLINICAL IMPRESSION: Re-assesd goal progress with mild gains in 2 MWT  and FOTO noted.  Still unable to stand from elevated table w/o UE support.   Will extend 1w4 to address remaining goals.  Patient presents to first follow up PT session reporting continued BIL hip pain and HEP compliance. Session today focused on LE strengthening and increasing standing activity tolerance as patient is able. He needed frequent rest breaks from standing exercises, but is able to complete all exercises. He is interested in performing more standing exercises than seated as he has already done [facility] with mostly seated exercises and wants to gain strength and standing activity tolerance. Patient continues to benefit from skilled PT services and should be progressed as able to  improve functional independence.   OBJECTIVE IMPAIRMENTS: Abnormal gait, decreased activity tolerance, decreased endurance, decreased mobility, difficulty walking, decreased ROM, decreased strength, improper body mechanics, obesity, and pain.   ACTIVITY LIMITATIONS: bending, sitting, standing, squatting, stairs, transfers, bed mobility, and locomotion level  PERSONAL FACTORS: Age, Fitness, Past/current experiences, Time since onset of injury/illness/exacerbation, and 1 comorbidity: obesity  are also affecting patient's functional outcome.   REHAB POTENTIAL: Fair based on body habitus, chronicity and underlying degenerative changes  CLINICAL DECISION MAKING: Stable/uncomplicated  EVALUATION COMPLEXITY: Low   GOALS: Goals reviewed with patient? No  SHORT TERM GOALS=LONG TERM GOALS: Target date: 04/13/2023   Patient to demonstrate independence in HEP  Baseline: [id number] Goal status: Met  2.  Increase 30s chair stand test to 1 rep Baseline: 0 reps; 87/22/24 0 reps Goal status: Ongoing  3.  Increase distance on 2 MWT to 274ft+ Baseline: 268ft with RW; 274ft w/rollator Goal status: Ongoing  4.  Increase BLE strength to 4-/5 Baseline:  MMT Right eval Left eval  Hip flexion 3+ 3+  Hip extension 3+ 3+   Hip abduction 3+ 3+  Hip adduction    Hip internal rotation    Hip external rotation    Knee flexion 3+ 3+  Knee extension 3+ 3+  Ankle dorsiflexion    Ankle plantarflexion 3+ 3+   Goal status: INITIAL      PLAN:  PT FREQUENCY: 2x/week  PT DURATION: 4 weeks  PLANNED INTERVENTIONS: Therapeutic exercises, Therapeutic activity, Neuromuscular re-education, Balance training, Gait training, Patient/Family education, Self Care, Joint mobilization, Stair training, DME instructions, Aquatic Therapy, Electrical stimulation, Cryotherapy, Moist heat, Manual therapy, and Re-evaluation  PLAN FOR NEXT SESSION: HEP review and update, manual techniques as appropriate, aerobic tasks, ROM and flexibility activities, strengthening and PREs, TPDN, gait and balance training as needed     Hildred Laser, PT 04/10/2023, 12:17 PM   Referring diagnosis? B hip pain Treatment diagnosis? (if different than referring diagnosis) B OA of hips What was this (referring dx) caused by? []  Surgery []  Fall []  Ongoing issue [x]  Arthritis []  Other: ____________  Laterality: []  Rt []  Lt [x]  Both  Check all possible CPT codes:  *CHOOSE 10 OR LESS*    [x]  97110 (Therapeutic Exercise)  []  92507 (SLP Treatment)  [x]  97112 (Neuro Re-ed)   []  92526 (Swallowing Treatment)   [x]  93810 (Gait Training)   []  17510 (Cognitive Training, 1st 15 minutes) [x]  97140 (Manual Therapy)   []  97130 (Cognitive Training, each add'l 15 minutes)  [x]  97164 (Re-evaluation)                              []  Other, List CPT Code ____________  [x]  97530 (Therapeutic Activities)     [x]  97535 (Self Care)   [x]  All codes above (97110 - 97535)  []  97012 (Mechanical Traction)  []  97014 (E-stim Unattended)  []  97032 (E-stim manual)  []  97033 (Ionto)  []  97035 (Ultrasound) []  97750 (Physical Performance Training) []  [id number] (Aquatic Therapy) []  97016 (Vasopneumatic Device) []  [id number] (Paraffin) []  97034 (Contrast Bath) []  97597  (Wound Care 1st 20 sq cm) []  97598 (Wound Care each add'l 20 sq cm) []  97760 (Orthotic Fabrication, Fitting, Training Initial) []  [id number] (Prosthetic Management and Training Initial) []  [id number] (Orthotic or Prosthetic Training/ Modification Subsequent)

## 2023-04-08 DIAGNOSIS — F4322 Adjustment disorder with anxiety: Secondary | ICD-10-CM | POA: Diagnosis not present

## 2023-04-10 ENCOUNTER — Telehealth: Payer: Self-pay | Admitting: Family Medicine

## 2023-04-10 ENCOUNTER — Ambulatory Visit: Payer: Medicare HMO

## 2023-04-10 DIAGNOSIS — R2689 Other abnormalities of gait and mobility: Secondary | ICD-10-CM | POA: Diagnosis not present

## 2023-04-10 DIAGNOSIS — M6281 Muscle weakness (generalized): Secondary | ICD-10-CM | POA: Diagnosis not present

## 2023-04-10 DIAGNOSIS — M25552 Pain in left hip: Secondary | ICD-10-CM | POA: Diagnosis not present

## 2023-04-10 DIAGNOSIS — M25551 Pain in right hip: Secondary | ICD-10-CM | POA: Diagnosis not present

## 2023-04-10 NOTE — Telephone Encounter (Signed)
Diane from [employer] called asking if our office received the fax that was sent for pt for a PA request for personal care services? Call back # [phone]

## 2023-04-10 NOTE — Therapy (Unsigned)
OUTPATIENT PHYSICAL THERAPY TREATMENT NOTE   Patient Name: Michael Payne MRN: [id number] DOB:16-Mar-1971, 52 y.o., male Today's Date: 04/12/2023  END OF SESSION:  PT End of Session - 04/12/23 1134     Visit Number 7    Number of Visits 8    Date for PT Re-Evaluation 05/11/23    Authorization Type [employer] MCR    PT Start Time 1130    PT Stop Time 1210    PT Time Calculation (min) 40 min    Activity Tolerance Patient tolerated treatment well    Behavior During Therapy WFL for tasks assessed/performed                  Past Medical History:  Diagnosis Date   Anemia    borderline anemia, was instructed to eat more iron filled foods   Cardiac enlargement 06/03/2022   Noted on Xray   CHF (congestive heart failure) (HCC) 05/2021   GERD (gastroesophageal reflux disease)    lactose intolerance, patient denies symptoms of reflux   Hip pain    History of kidney stones    HTN (hypertension)    Morbid obesity (HCC)    Obesity    Pneumonia 05/2021   Pre-diabetes    Sleep apnea    Past Surgical History:  Procedure Laterality Date   FOOT SURGERY Right    KNEE ARTHROSCOPY WITH MEDIAL MENISECTOMY Left 12/16/2014   Procedure: KNEE ARTHROSCOPY WITH MEDIAL MENISECTOMY;  Surgeon: Sheral Apley, MD;  Location: [facility];  Service: Orthopedics;  Laterality: Left;   UPPER GI ENDOSCOPY N/A 06/20/2022   Procedure: UPPER GI ENDOSCOPY;  Surgeon: Quentin Ore, MD;  Location: [facility];  Service: General;  Laterality: N/A;   Patient Active Problem List   Diagnosis Date Noted   Advance care planning 01/29/2023   Skin lesion 01/29/2023   Chronic pain 01/29/2023   Hyperglycemia 11/01/2022   Morbid obesity with BMI of 60.0-69.9, adult (HCC) 06/20/2022   Right shoulder pain 05/11/2022   Hordeolum externum of right eye 02/18/2022   Nonobstructive transaminitis 06/28/2021   Decubitus ulcer of posterior thigh, stage 2 (HCC) 06/28/2021   Obesity, Class III, BMI 40-49.9 (morbid obesity)  (HCC) 06/10/2021   Osteoarthritis 06/10/2021   Pressure injury of skin 06/09/2021   Respiratory failure (HCC) 06/01/2021   Acute diastolic CHF (congestive heart failure) (HCC) 05/31/2021   CHF (congestive heart failure) (HCC) 05/31/2021   Hypoxia 05/31/2021   Gout, unspecified 03/10/2021   Edema 03/10/2021   Muscle spasms of both lower extremities 07/29/2020   Bedbound 05/22/2020   Unilateral osteoarthritis of hip, right 12/27/2019   Severe obstructive sleep apnea 02/22/2018   Hypertriglyceridemia 08/24/2017   Low HDL (under 40) 08/24/2017   Metabolic syndrome 08/24/2017   Morbid obesity (HCC) 10/14/2013   HTN (hypertension) 10/14/2013    PCP: Joaquim Nam, MD  REFERRING PROVIDER: Joaquim Nam, MD  REFERRING DIAG: M16.0 (ICD-10-CM) - Osteoarthritis of both hips, unspecified osteoarthritis type  THERAPY DIAG:  Pain of both hip joints  Other abnormalities of gait and mobility  Muscle weakness (generalized)  Rationale for Evaluation and Treatment: Rehabilitation  ONSET DATE: chronic  SUBJECTIVE:   SUBJECTIVE STATEMENT: Soreness much improved since last session  PERTINENT HISTORY:  See PMH  PAIN:  Are you having pain?  Yes: NPRS scale: 5/10 Pain location: B hips Pain description: ache Aggravating factors: activity Relieving factors: medication  PRECAUTIONS: None  WEIGHT BEARING RESTRICTIONS: No  FALLS:  Has patient fallen in last  6 months? No  LIVING ENVIRONMENT: Lives with: lives with their daughter Lives in: House/apartment Stairs:  5 Has following equipment at home: Environmental consultant - 2 wheeled  OCCUPATION: not working  PLOF: Independent  PATIENT GOALS: To manage my hip pain  OBJECTIVE:   DIAGNOSTIC FINDINGS: none  PATIENT SURVEYS:  FOTO: 43(60 predicted); 04/10/23 47  MUSCLE LENGTH: Hamstrings: Right 45 deg; Left 45 deg (measured in seated)  POSTURE:  unable to assess due to body habitus  PALPATION: Deferred due to body habitus  LOWER  EXTREMITY ROM:  ROM Right eval Left eval  Hip flexion    Hip extension    Hip abduction    Hip adduction    Hip internal rotation    Hip external rotation    Knee flexion    Knee extension    Ankle dorsiflexion    Ankle plantarflexion    Ankle inversion    Ankle eversion     (Blank rows = not tested)  LOWER EXTREMITY MMT:  MMT Right eval Left eval  Hip flexion 3+ 3+  Hip extension 3+ 3+  Hip abduction 3+ 3+  Hip adduction    Hip internal rotation    Hip external rotation    Knee flexion 3+ 3+  Knee extension 3+ 3+  Ankle dorsiflexion    Ankle plantarflexion 3+ 3+  Ankle inversion    Ankle eversion     (Blank rows = not tested)  LOWER EXTREMITY SPECIAL TESTS:  UTA due to body habitus  FUNCTIONAL TESTS:  30 seconds chair stand test 0 reps; 04/10/23 0 reps; 04/12/23 1 rep from high mat table 2 MWT 252ft with RW; 04/10/23 289ft  GAIT: Distance walked: 7ftx2 Assistive device utilized: Environmental consultant - 2 wheeled Level of assistance: Complete Independence Comments: slow cadence, flexed poture   TODAY'S TREATMENT:        [facility] Adult PT Treatment:                                                DATE: 04/12/23 Therapeutic Exercise: 30s chair stand test 1 reps FAQs 6# 2x15 B  Standing march 5# 15/15 2 bouts Supine hip fallouts BlaTB 15x B, 15/15 unilaterally S/L clamshells BlaTB  15/15 Standing hamstring curls 6# 15x B alternating sets 2 bouts Ambulation with rollator 533ft with several rest periods O2 sats 96% 127 BPM following task  [facility] Adult PT Treatment:                                                DATE: 04/10/23 Therapeutic Exercise: 30s chair stand test 0 reps FAQs 5# 3x15 B  Supine march 5# 15/15 Supine SLR 5# 10/10 Supine hip fallouts BlaTB 15x B, 15/15 unilaterally S/L clamshells BlaTB  15/15 Standing hamstring curls 5# 15x B alternating sets Therapeutic Activity: 2 MWT 215 ft with rollator FOTO re-assessment  [facility] Adult PT Treatment:                                                 DATE: 04/05/23 Therapeutic Exercise: 326ft ambulation in 4\' 20"  with rollator 21m  walk test 17s (1.8 m/s) FAQs 5# 3x15 B  Supine march 5# 2x15 alternating Supine hip fallouts BlaTB 15x B, 15/15 unilaterally S/L clamshells BlaTB  15/15 STS with 15# DB 10x from elevated table Standing hamstring curls 15x2 B alternating sets  [facility] Adult PT Treatment:                                                DATE: 04/03/23 Therapeutic Exercise: LAQ 2x10 3# each FAQ x 15 3# Seated ball squeeze 2x10 - 5" hold STS 3x5 370 ft ambulation with rollator in 3.5 min Seated hamstring curl 2x15 black band Supine clamshell 3x20 black band Supine SLR 2x10 Lateral walk RTB x 3 laps HR and O2 sats monitored remaining within normative ranges  [facility] Adult PT Treatment:                                                DATE: 03/30/23 Therapeutic Exercise: 370 ft ambulation with rollator in 4 min Supine SLR 15/15 Supine hip fallouts BluTB 15x B, 15/15 unilaterally Supine march 3# 15/15 alt. FAQs 3# 15/15 alt. Seated hip tosses 3000g ball 15/15 Seated chops 3000g ball 15/15 Standing heel/toe 15x HR and O2 sats monitored remaining within normative ranges   [facility] Adult PT Treatment:                                                DATE: 03/28/23 Therapeutic Exercise: Seated marching x30" Standing hamstring curl 2x10 3# BIL Seated LAQ 3# x25 BIL Gait rolled into therex x185' Standing heel raises 2x10 (standing break btw sets) Standing marching 3x30" STS from elevated table 57 cm x5                                                                                                                     DATE: 03/13/23 Eval and HEP    PATIENT EDUCATION:  Education details: Discussed eval findings, rehab rationale and POC and patient is in agreement  Person educated: Patient Education method: Explanation Education comprehension: verbalized understanding and needs further education  HOME EXERCISE  PROGRAM: Access Code: [email or web address] URL: [email or web address] Date: 03/16/2023 Prepared by: Gustavus Bryant  Exercises - Seated Hamstring Stretch  - 2 x daily - 5 x weekly - 1 sets - 2 reps - 30s hold - Standing Heel Raise with Support  - 2 x daily - 5 x weekly - 2 sets - 15 reps - Bilateral Long Arc Quad  - 2 x daily - 5 x weekly - 2 sets - 15 reps  ASSESSMENT:  CLINICAL IMPRESSION: Increased ambulation distance to  538ft+ but needing multiple rest periods.  Vital signs WNL following task  Advanced to 6# on LE PREs.  Increased reps as well as build endurance.  Began heel raises for strength and balance.  Patient presents to first follow up PT session reporting continued BIL hip pain and HEP compliance. Session today focused on LE strengthening and increasing standing activity tolerance as patient is able. He needed frequent rest breaks from standing exercises, but is able to complete all exercises. He is interested in performing more standing exercises than seated as he has already done [facility] with mostly seated exercises and wants to gain strength and standing activity tolerance. Patient continues to benefit from skilled PT services and should be progressed as able to improve functional independence.   OBJECTIVE IMPAIRMENTS: Abnormal gait, decreased activity tolerance, decreased endurance, decreased mobility, difficulty walking, decreased ROM, decreased strength, improper body mechanics, obesity, and pain.   ACTIVITY LIMITATIONS: bending, sitting, standing, squatting, stairs, transfers, bed mobility, and locomotion level  PERSONAL FACTORS: Age, Fitness, Past/current experiences, Time since onset of injury/illness/exacerbation, and 1 comorbidity: obesity  are also affecting patient's functional outcome.   REHAB POTENTIAL: Fair based on body habitus, chronicity and underlying degenerative changes  CLINICAL DECISION MAKING: Stable/uncomplicated  EVALUATION COMPLEXITY:  Low   GOALS: Goals reviewed with patient? No  SHORT TERM GOALS=LONG TERM GOALS: Target date: 04/13/2023   Patient to demonstrate independence in HEP  Baseline: [id number] Goal status: Met  2.  Increase 30s chair stand test to 1 rep Baseline: 0 reps; 04/10/23 0 reps; 04/12/23 1 reps after several attempts Goal status: Ongoing  3.  Increase distance on 2 MWT to 259ft+ Baseline: 221ft with RW; 231ft w/rollator Goal status: Ongoing  4.  Increase BLE strength to 4-/5 Baseline:  MMT Right eval Left eval  Hip flexion 3+ 3+  Hip extension 3+ 3+  Hip abduction 3+ 3+  Hip adduction    Hip internal rotation    Hip external rotation    Knee flexion 3+ 3+  Knee extension 3+ 3+  Ankle dorsiflexion    Ankle plantarflexion 3+ 3+   Goal status: INITIAL      PLAN:  PT FREQUENCY: 2x/week  PT DURATION: 4 weeks  PLANNED INTERVENTIONS: Therapeutic exercises, Therapeutic activity, Neuromuscular re-education, Balance training, Gait training, Patient/Family education, Self Care, Joint mobilization, Stair training, DME instructions, Aquatic Therapy, Electrical stimulation, Cryotherapy, Moist heat, Manual therapy, and Re-evaluation  PLAN FOR NEXT SESSION: HEP review and update, manual techniques as appropriate, aerobic tasks, ROM and flexibility activities, strengthening and PREs, TPDN, gait and balance training as needed     Hildred Laser, PT 04/12/2023, 12:06 PM   Referring diagnosis? B hip pain Treatment diagnosis? (if different than referring diagnosis) B OA of hips What was this (referring dx) caused by? []  Surgery []  Fall []  Ongoing issue [x]  Arthritis []  Other: ____________  Laterality: []  Rt []  Lt [x]  Both  Check all possible CPT codes:  *CHOOSE 10 OR LESS*    [x]  97110 (Therapeutic Exercise)  []  92507 (SLP Treatment)  [x]  56213 (Neuro Re-ed)   []  92526 (Swallowing Treatment)   [x]  97116 (Gait Training)   []  [id number] (Cognitive Training, 1st 15 minutes) [x]  08657  (Manual Therapy)   []  97130 (Cognitive Training, each add'l 15 minutes)  [x]  97164 (Re-evaluation)                              []  Other, List CPT Code  ____________  [x]  97530 (Therapeutic Activities)     [x]  97535 (Self Care)   [x]  All codes above (97110 - 97535)  []  97012 (Mechanical Traction)  []  97014 (E-stim Unattended)  []  97032 (E-stim manual)  []  97033 (Ionto)  []  97035 (Ultrasound) []  97750 (Physical Performance Training) []  [id number] (Aquatic Therapy) []  97016 (Vasopneumatic Device) []  [id number] (Paraffin) []  97034 (Contrast Bath) []  97597 (Wound Care 1st 20 sq cm) []  97598 (Wound Care each add'l 20 sq cm) []  97760 (Orthotic Fabrication, Fitting, Training Initial) []  [id number] (Prosthetic Management and Training Initial) []  [id number] (Orthotic or Prosthetic Training/ Modification Subsequent)

## 2023-04-10 NOTE — Telephone Encounter (Signed)
I have not received anything yet in my S drive

## 2023-04-12 ENCOUNTER — Ambulatory Visit: Payer: Medicare HMO

## 2023-04-12 DIAGNOSIS — M25552 Pain in left hip: Secondary | ICD-10-CM

## 2023-04-12 DIAGNOSIS — M6281 Muscle weakness (generalized): Secondary | ICD-10-CM | POA: Diagnosis not present

## 2023-04-12 DIAGNOSIS — R2689 Other abnormalities of gait and mobility: Secondary | ICD-10-CM

## 2023-04-12 DIAGNOSIS — M25551 Pain in right hip: Secondary | ICD-10-CM | POA: Diagnosis not present

## 2023-04-15 DIAGNOSIS — F4322 Adjustment disorder with anxiety: Secondary | ICD-10-CM | POA: Diagnosis not present

## 2023-04-17 ENCOUNTER — Ambulatory Visit: Payer: Medicare HMO

## 2023-04-17 DIAGNOSIS — R2689 Other abnormalities of gait and mobility: Secondary | ICD-10-CM

## 2023-04-17 DIAGNOSIS — M25552 Pain in left hip: Secondary | ICD-10-CM

## 2023-04-17 DIAGNOSIS — M6281 Muscle weakness (generalized): Secondary | ICD-10-CM

## 2023-04-17 NOTE — Therapy (Signed)
OUTPATIENT PHYSICAL THERAPY TREATMENT NOTE   Patient Name: Michael Payne MRN: [id number] DOB:1971-04-21, 52 y.o., male Today's Date: 04/17/2023  END OF SESSION:  PT End of Session - 04/17/23 1117     Visit Number 8    Number of Visits 8    Date for PT Re-Evaluation 05/11/23    Authorization Type [employer] MCR    PT Start Time 1130    PT Stop Time 1210    PT Time Calculation (min) 40 min    Activity Tolerance Patient tolerated treatment well    Behavior During Therapy WFL for tasks assessed/performed                   Past Medical History:  Diagnosis Date   Anemia    borderline anemia, was instructed to eat more iron filled foods   Cardiac enlargement 06/03/2022   Noted on Xray   CHF (congestive heart failure) (HCC) 05/2021   GERD (gastroesophageal reflux disease)    lactose intolerance, patient denies symptoms of reflux   Hip pain    History of kidney stones    HTN (hypertension)    Morbid obesity (HCC)    Obesity    Pneumonia 05/2021   Pre-diabetes    Sleep apnea    Past Surgical History:  Procedure Laterality Date   FOOT SURGERY Right    KNEE ARTHROSCOPY WITH MEDIAL MENISECTOMY Left 12/16/2014   Procedure: KNEE ARTHROSCOPY WITH MEDIAL MENISECTOMY;  Surgeon: Sheral Apley, MD;  Location: [facility];  Service: Orthopedics;  Laterality: Left;   UPPER GI ENDOSCOPY N/A 06/20/2022   Procedure: UPPER GI ENDOSCOPY;  Surgeon: Quentin Ore, MD;  Location: [facility];  Service: General;  Laterality: N/A;   Patient Active Problem List   Diagnosis Date Noted   Advance care planning 01/29/2023   Skin lesion 01/29/2023   Chronic pain 01/29/2023   Hyperglycemia 11/01/2022   Morbid obesity with BMI of 60.0-69.9, adult (HCC) 06/20/2022   Right shoulder pain 05/11/2022   Hordeolum externum of right eye 02/18/2022   Nonobstructive transaminitis 06/28/2021   Decubitus ulcer of posterior thigh, stage 2 (HCC) 06/28/2021   Obesity, Class III, BMI 40-49.9 (morbid  obesity) (HCC) 06/10/2021   Osteoarthritis 06/10/2021   Pressure injury of skin 06/09/2021   Respiratory failure (HCC) 06/01/2021   Acute diastolic CHF (congestive heart failure) (HCC) 05/31/2021   CHF (congestive heart failure) (HCC) 05/31/2021   Hypoxia 05/31/2021   Gout, unspecified 03/10/2021   Edema 03/10/2021   Muscle spasms of both lower extremities 07/29/2020   Bedbound 05/22/2020   Unilateral osteoarthritis of hip, right 12/27/2019   Severe obstructive sleep apnea 02/22/2018   Hypertriglyceridemia 08/24/2017   Low HDL (under 40) 08/24/2017   Metabolic syndrome 08/24/2017   Morbid obesity (HCC) 10/14/2013   HTN (hypertension) 10/14/2013    PCP: Joaquim Nam, MD  REFERRING PROVIDER: Joaquim Nam, MD  REFERRING DIAG: M16.0 (ICD-10-CM) - Osteoarthritis of both hips, unspecified osteoarthritis type  THERAPY DIAG:  Pain of both hip joints  Other abnormalities of gait and mobility  Muscle weakness (generalized)  Rationale for Evaluation and Treatment: Rehabilitation  ONSET DATE: chronic  SUBJECTIVE:   SUBJECTIVE STATEMENT: Sore from rainy weather  PERTINENT HISTORY:  See PMH  PAIN:  Are you having pain?  Yes: NPRS scale: 5/10 Pain location: B hips Pain description: ache Aggravating factors: activity Relieving factors: medication  PRECAUTIONS: None  WEIGHT BEARING RESTRICTIONS: No  FALLS:  Has patient fallen in last 6  months? No  LIVING ENVIRONMENT: Lives with: lives with their daughter Lives in: House/apartment Stairs:  5 Has following equipment at home: Environmental consultant - 2 wheeled  OCCUPATION: not working  PLOF: Independent  PATIENT GOALS: To manage my hip pain  OBJECTIVE:   DIAGNOSTIC FINDINGS: none  PATIENT SURVEYS:  FOTO: 43(60 predicted); 04/10/23 47  MUSCLE LENGTH: Hamstrings: Right 45 deg; Left 45 deg (measured in seated)  POSTURE:  unable to assess due to body habitus  PALPATION: Deferred due to body habitus  LOWER  EXTREMITY ROM: Functional for gait, transfers and bed mobility  ROM Right eval Left eval  Hip flexion    Hip extension    Hip abduction    Hip adduction    Hip internal rotation    Hip external rotation    Knee flexion    Knee extension    Ankle dorsiflexion    Ankle plantarflexion    Ankle inversion    Ankle eversion     (Blank rows = not tested)  LOWER EXTREMITY MMT:  MMT Right eval Left eval  Hip flexion 3+ 3+  Hip extension 3+ 3+  Hip abduction 3+ 3+  Hip adduction    Hip internal rotation    Hip external rotation    Knee flexion 3+ 3+  Knee extension 3+ 3+  Ankle dorsiflexion    Ankle plantarflexion 3+ 3+  Ankle inversion    Ankle eversion     (Blank rows = not tested)  LOWER EXTREMITY SPECIAL TESTS:  UTA due to body habitus  FUNCTIONAL TESTS:  30 seconds chair stand test 0 reps; 04/10/23 0 reps; 04/12/23 1 rep from high mat table; 04/17/23 2 reps from 22 in height w/o UE support 2 MWT 210ft with RW; 04/10/23 216ft 04/17/23 6 MWT 47ft with rollator  GAIT: Distance walked: 65ftx2 Assistive device utilized: Environmental consultant - 2 wheeled Level of assistance: Complete Independence Comments: slow cadence, flexed poture   TODAY'S TREATMENT:        [facility] Adult PT Treatment:                                                DATE: 04/17/23 Therapeutic Exercise: SAQ 6# 15/15 Supine march 6# 15/15 Supine hip fallouts BlaTB 15x B, 15/15 unilaterally S/L clamshells BlaTB  15/15 FAQs 6# 2x15 B  Standing march 5# 15/15   Therapeutic Activity: 30s chair stand test 2 reps from 22 in height 6 MWT 431ft with rollator  [facility] Adult PT Treatment:                                                DATE: 04/12/23 Therapeutic Exercise: 30s chair stand test 1 reps FAQs 6# 2x15 B  Standing march 5# 15/15 2 bouts Supine hip fallouts BlaTB 15x B, 15/15 unilaterally S/L clamshells BlaTB  15/15 Standing hamstring curls 6# 15x B alternating sets 2 bouts Ambulation with rollator 573ft with  several rest periods O2 sats 96% 127 BPM following task  [facility] Adult PT Treatment:  DATE: 04/10/23 Therapeutic Exercise: 30s chair stand test 0 reps FAQs 5# 3x15 B  Supine march 5# 15/15 Supine SLR 5# 10/10 Supine hip fallouts BlaTB 15x B, 15/15 unilaterally S/L clamshells BlaTB  15/15 Standing hamstring curls 5# 15x B alternating sets Therapeutic Activity: 2 MWT 215 ft with rollator FOTO re-assessment                                                                                                                     DATE: 03/13/23 Eval and HEP    PATIENT EDUCATION:  Education details: Discussed eval findings, rehab rationale and POC and patient is in agreement  Person educated: Patient Education method: Explanation Education comprehension: verbalized understanding and needs further education  HOME EXERCISE PROGRAM: Access Code: [email or web address] URL: [email or web address] Date: 03/16/2023 Prepared by: Gustavus Bryant  Exercises - Seated Hamstring Stretch  - 2 x daily - 5 x weekly - 1 sets - 2 reps - 30s hold - Standing Heel Raise with Support  - 2 x daily - 5 x weekly - 2 sets - 15 reps - Bilateral Long Arc Quad  - 2 x daily - 5 x weekly - 2 sets - 15 reps  ASSESSMENT:  CLINICAL IMPRESSION: Able to increase reps on 30s chair stand test to 2.  6 MWT distance 437ft (normative distance 1876 ft).  Rainy weather hindered performance today.  Body habitus as well as advanced OA in hips and knees contributing to slow progress.  Recommend continue OPPT 1w4 to maximize function and transition to independent gym program.  Patient presents to first follow up PT session reporting continued BIL hip pain and HEP compliance. Session today focused on LE strengthening and increasing standing activity tolerance as patient is able. He needed frequent rest breaks from standing exercises, but is able to complete all exercises. He is interested in  performing more standing exercises than seated as he has already done [facility] with mostly seated exercises and wants to gain strength and standing activity tolerance. Patient continues to benefit from skilled PT services and should be progressed as able to improve functional independence.   OBJECTIVE IMPAIRMENTS: Abnormal gait, decreased activity tolerance, decreased endurance, decreased mobility, difficulty walking, decreased ROM, decreased strength, improper body mechanics, obesity, and pain.   ACTIVITY LIMITATIONS: bending, sitting, standing, squatting, stairs, transfers, bed mobility, and locomotion level  PERSONAL FACTORS: Age, Fitness, Past/current experiences, Time since onset of injury/illness/exacerbation, and 1 comorbidity: obesity  are also affecting patient's functional outcome.   REHAB POTENTIAL: Fair based on body habitus, chronicity and underlying degenerative changes  CLINICAL DECISION MAKING: Stable/uncomplicated  EVALUATION COMPLEXITY: Low   GOALS: Goals reviewed with patient? No  SHORT TERM GOALS=LONG TERM GOALS: Target date: 04/13/2023   Patient to demonstrate independence in HEP  Baseline: [id number] Goal status: Met  2.  Increase 30s chair stand test to 1 rep Baseline: 0 reps; 04/10/23 0 reps; 04/12/23 1 reps after several attempts; 04/17/23 2 reps from 22 in height Goal status: Ongoing  3.  Increase  distance on 2 MWT to 268ft+ Baseline: 235ft with RW; 239ft w/rollator Goal status: Ongoing  4.  Increase BLE strength to 4-/5 Baseline:  MMT Right eval Left eval  Hip flexion 3+ 3+  Hip extension 3+ 3+  Hip abduction 3+ 3+  Hip adduction    Hip internal rotation    Hip external rotation    Knee flexion 3+ 3+  Knee extension 3+ 3+  Ankle dorsiflexion    Ankle plantarflexion 3+ 3+   Goal status: INITIAL  Added 04/17/23 5. Increase FOTO Score to 60   Baseline: 43(60 predicted); 04/10/23 47  Goal Status: Ongoing  6. Increase distance on 6 MWT to 553ft with  RW  Baseline 441ft with RW  Goal Status: INITIAL     PLAN:  PT FREQUENCY: 1x/week  PT DURATION: 4 weeks  PLANNED INTERVENTIONS: Therapeutic exercises, Therapeutic activity, Neuromuscular re-education, Balance training, Gait training, Patient/Family education, Self Care, Joint mobilization, Stair training, DME instructions, Aquatic Therapy, Electrical stimulation, Cryotherapy, Moist heat, Manual therapy, and Re-evaluation  PLAN FOR NEXT SESSION: HEP review and update, manual techniques as appropriate, aerobic tasks, ROM and flexibility activities, strengthening and PREs, TPDN, gait and balance training as needed     Hildred Laser, PT 04/17/2023, 11:19 AM   Referring diagnosis? B hip pain Treatment diagnosis? (if different than referring diagnosis) B OA of hips What was this (referring dx) caused by? []  Surgery []  Fall []  Ongoing issue [x]  Arthritis []  Other: ____________  Laterality: []  Rt []  Lt [x]  Both  Check all possible CPT codes:  *CHOOSE 10 OR LESS*    [x]  97110 (Therapeutic Exercise)  []  92507 (SLP Treatment)  [x]  16109 (Neuro Re-ed)   []  92526 (Swallowing Treatment)   [x]  97116 (Gait Training)   []  [id number] (Cognitive Training, 1st 15 minutes) [x]  97140 (Manual Therapy)   []  97130 (Cognitive Training, each add'l 15 minutes)  [x]  97164 (Re-evaluation)                              []  Other, List CPT Code ____________  [x]  60454 (Therapeutic Activities)     [x]  97535 (Self Care)   [x]  All codes above (97110 - 97535)  []  97012 (Mechanical Traction)  []  97014 (E-stim Unattended)  []  97032 (E-stim manual)  []  97033 (Ionto)  []  97035 (Ultrasound) []  97750 (Physical Performance Training) []  [id number] (Aquatic Therapy) []  97016 (Vasopneumatic Device) []  [id number] (Paraffin) []  97034 (Contrast Bath) []  97597 (Wound Care 1st 20 sq cm) []  97598 (Wound Care each add'l 20 sq cm) []  97760 (Orthotic Fabrication, Fitting, Training Initial) []  [id number] (Prosthetic Management and  Training Initial) []  [id number] (Orthotic or Prosthetic Training/ Modification Subsequent)

## 2023-04-21 ENCOUNTER — Ambulatory Visit: Payer: Self-pay

## 2023-04-26 NOTE — Therapy (Deleted)
OUTPATIENT PHYSICAL THERAPY TREATMENT NOTE   Patient Name: Michael Payne MRN: [id number] DOB:12-31-1970, 52 y.o., male Today's Date: 04/26/2023  END OF SESSION:          Past Medical History:  Diagnosis Date   Anemia    borderline anemia, was instructed to eat more iron filled foods   Cardiac enlargement 06/03/2022   Noted on Xray   CHF (congestive heart failure) (HCC) 05/2021   GERD (gastroesophageal reflux disease)    lactose intolerance, patient denies symptoms of reflux   Hip pain    History of kidney stones    HTN (hypertension)    Morbid obesity (HCC)    Obesity    Pneumonia 05/2021   Pre-diabetes    Sleep apnea    Past Surgical History:  Procedure Laterality Date   FOOT SURGERY Right    KNEE ARTHROSCOPY WITH MEDIAL MENISECTOMY Left 12/16/2014   Procedure: KNEE ARTHROSCOPY WITH MEDIAL MENISECTOMY;  Surgeon: Sheral Apley, MD;  Location: [facility];  Service: Orthopedics;  Laterality: Left;   UPPER GI ENDOSCOPY N/A 06/20/2022   Procedure: UPPER GI ENDOSCOPY;  Surgeon: Quentin Ore, MD;  Location: [facility];  Service: General;  Laterality: N/A;   Patient Active Problem List   Diagnosis Date Noted   Advance care planning 01/29/2023   Skin lesion 01/29/2023   Chronic pain 01/29/2023   Hyperglycemia 11/01/2022   Morbid obesity with BMI of 60.0-69.9, adult (HCC) 06/20/2022   Right shoulder pain 05/11/2022   Hordeolum externum of right eye 02/18/2022   Nonobstructive transaminitis 06/28/2021   Decubitus ulcer of posterior thigh, stage 2 (HCC) 06/28/2021   Obesity, Class III, BMI 40-49.9 (morbid obesity) (HCC) 06/10/2021   Osteoarthritis 06/10/2021   Pressure injury of skin 06/09/2021   Respiratory failure (HCC) 06/01/2021   Acute diastolic CHF (congestive heart failure) (HCC) 05/31/2021   CHF (congestive heart failure) (HCC) 05/31/2021   Hypoxia 05/31/2021   Gout, unspecified 03/10/2021   Edema 03/10/2021   Muscle spasms of both lower extremities  07/29/2020   Bedbound 05/22/2020   Unilateral osteoarthritis of hip, right 12/27/2019   Severe obstructive sleep apnea 02/22/2018   Hypertriglyceridemia 08/24/2017   Low HDL (under 40) 08/24/2017   Metabolic syndrome 08/24/2017   Morbid obesity (HCC) 10/14/2013   HTN (hypertension) 10/14/2013    PCP: Joaquim Nam, MD  REFERRING PROVIDER: Joaquim Nam, MD  REFERRING DIAG: M16.0 (ICD-10-CM) - Osteoarthritis of both hips, unspecified osteoarthritis type  THERAPY DIAG:  No diagnosis found.  Rationale for Evaluation and Treatment: Rehabilitation  ONSET DATE: chronic  SUBJECTIVE:   SUBJECTIVE STATEMENT: Sore from rainy weather  PERTINENT HISTORY:  See PMH  PAIN:  Are you having pain?  Yes: NPRS scale: 5/10 Pain location: B hips Pain description: ache Aggravating factors: activity Relieving factors: medication  PRECAUTIONS: None  WEIGHT BEARING RESTRICTIONS: No  FALLS:  Has patient fallen in last 6 months? No  LIVING ENVIRONMENT: Lives with: lives with their daughter Lives in: House/apartment Stairs:  5 Has following equipment at home: Environmental consultant - 2 wheeled  OCCUPATION: not working  PLOF: Independent  PATIENT GOALS: To manage my hip pain  OBJECTIVE:   DIAGNOSTIC FINDINGS: none  PATIENT SURVEYS:  FOTO: 43(60 predicted); 04/10/23 47  MUSCLE LENGTH: Hamstrings: Right 45 deg; Left 45 deg (measured in seated)  POSTURE:  unable to assess due to body habitus  PALPATION: Deferred due to body habitus  LOWER EXTREMITY ROM: Functional for gait, transfers and bed mobility  ROM Right  eval Left eval  Hip flexion    Hip extension    Hip abduction    Hip adduction    Hip internal rotation    Hip external rotation    Knee flexion    Knee extension    Ankle dorsiflexion    Ankle plantarflexion    Ankle inversion    Ankle eversion     (Blank rows = not tested)  LOWER EXTREMITY MMT:  MMT Right eval Left eval  Hip flexion 3+ 3+  Hip extension  3+ 3+  Hip abduction 3+ 3+  Hip adduction    Hip internal rotation    Hip external rotation    Knee flexion 3+ 3+  Knee extension 3+ 3+  Ankle dorsiflexion    Ankle plantarflexion 3+ 3+  Ankle inversion    Ankle eversion     (Blank rows = not tested)  LOWER EXTREMITY SPECIAL TESTS:  UTA due to body habitus  FUNCTIONAL TESTS:  30 seconds chair stand test 0 reps; 04/10/23 0 reps; 04/12/23 1 rep from high mat table; 04/17/23 2 reps from 22 in height w/o UE support 2 MWT 289ft with RW; 04/10/23 279ft 04/17/23 6 MWT 434ft with rollator  GAIT: Distance walked: 6ftx2 Assistive device utilized: Environmental consultant - 2 wheeled Level of assistance: Complete Independence Comments: slow cadence, flexed poture   TODAY'S TREATMENT:        [facility] Adult PT Treatment:                                                DATE: 04/17/23 Therapeutic Exercise: SAQ 6# 15/15 Supine march 6# 15/15 Supine hip fallouts BlaTB 15x B, 15/15 unilaterally S/L clamshells BlaTB  15/15 FAQs 6# 2x15 B  Standing march 5# 15/15   Therapeutic Activity: 30s chair stand test 2 reps from 22 in height 6 MWT 476ft with rollator  [facility] Adult PT Treatment:                                                DATE: 04/12/23 Therapeutic Exercise: 30s chair stand test 1 reps FAQs 6# 2x15 B  Standing march 5# 15/15 2 bouts Supine hip fallouts BlaTB 15x B, 15/15 unilaterally S/L clamshells BlaTB  15/15 Standing hamstring curls 6# 15x B alternating sets 2 bouts Ambulation with rollator 571ft with several rest periods O2 sats 96% 127 BPM following task  [facility] Adult PT Treatment:                                                DATE: 04/10/23 Therapeutic Exercise: 30s chair stand test 0 reps FAQs 5# 3x15 B  Supine march 5# 15/15 Supine SLR 5# 10/10 Supine hip fallouts BlaTB 15x B, 15/15 unilaterally S/L clamshells BlaTB  15/15 Standing hamstring curls 5# 15x B alternating sets Therapeutic Activity: 2 MWT 215 ft with rollator FOTO  re-assessment  DATE: 03/13/23 Eval and HEP    PATIENT EDUCATION:  Education details: Discussed eval findings, rehab rationale and POC and patient is in agreement  Person educated: Patient Education method: Explanation Education comprehension: verbalized understanding and needs further education  HOME EXERCISE PROGRAM: Access Code: [email or web address] URL: [email or web address] Date: 03/16/2023 Prepared by: Gustavus Bryant  Exercises - Seated Hamstring Stretch  - 2 x daily - 5 x weekly - 1 sets - 2 reps - 30s hold - Standing Heel Raise with Support  - 2 x daily - 5 x weekly - 2 sets - 15 reps - Bilateral Long Arc Quad  - 2 x daily - 5 x weekly - 2 sets - 15 reps  ASSESSMENT:  CLINICAL IMPRESSION: Able to increase reps on 30s chair stand test to 2.  6 MWT distance 412ft (normative distance 1876 ft).  Rainy weather hindered performance today.  Body habitus as well as advanced OA in hips and knees contributing to slow progress.  Recommend continue OPPT 1w4 to maximize function and transition to independent gym program.  Patient presents to first follow up PT session reporting continued BIL hip pain and HEP compliance. Session today focused on LE strengthening and increasing standing activity tolerance as patient is able. He needed frequent rest breaks from standing exercises, but is able to complete all exercises. He is interested in performing more standing exercises than seated as he has already done [facility] with mostly seated exercises and wants to gain strength and standing activity tolerance. Patient continues to benefit from skilled PT services and should be progressed as able to improve functional independence.   OBJECTIVE IMPAIRMENTS: Abnormal gait, decreased activity tolerance, decreased endurance, decreased mobility, difficulty walking, decreased ROM, decreased strength,  improper body mechanics, obesity, and pain.   ACTIVITY LIMITATIONS: bending, sitting, standing, squatting, stairs, transfers, bed mobility, and locomotion level  PERSONAL FACTORS: Age, Fitness, Past/current experiences, Time since onset of injury/illness/exacerbation, and 1 comorbidity: obesity  are also affecting patient's functional outcome.   REHAB POTENTIAL: Fair based on body habitus, chronicity and underlying degenerative changes  CLINICAL DECISION MAKING: Stable/uncomplicated  EVALUATION COMPLEXITY: Low   GOALS: Goals reviewed with patient? No  SHORT TERM GOALS=LONG TERM GOALS: Target date: 04/13/2023   Patient to demonstrate independence in HEP  Baseline: [id number] Goal status: Met  2.  Increase 30s chair stand test to 1 rep Baseline: 0 reps; 04/10/23 0 reps; 04/12/23 1 reps after several attempts; 04/17/23 2 reps from 22 in height Goal status: Ongoing  3.  Increase distance on 2 MWT to 259ft+ Baseline: 246ft with RW; 271ft w/rollator Goal status: Ongoing  4.  Increase BLE strength to 4-/5 Baseline:  MMT Right eval Left eval  Hip flexion 3+ 3+  Hip extension 3+ 3+  Hip abduction 3+ 3+  Hip adduction    Hip internal rotation    Hip external rotation    Knee flexion 3+ 3+  Knee extension 3+ 3+  Ankle dorsiflexion    Ankle plantarflexion 3+ 3+   Goal status: INITIAL  Added 04/17/23 5. Increase FOTO Score to 60   Baseline: 43(60 predicted); 04/10/23 47  Goal Status: Ongoing  6. Increase distance on 6 MWT to 562ft with RW  Baseline 491ft with RW  Goal Status: INITIAL     PLAN:  PT FREQUENCY: 1x/week  PT DURATION: 4 weeks  PLANNED INTERVENTIONS: Therapeutic exercises, Therapeutic activity, Neuromuscular re-education, Balance training, Gait training, Patient/Family education, Self Care, Joint mobilization, Stair training, DME instructions, Aquatic Therapy, Electrical stimulation, Cryotherapy, Moist  heat, Manual therapy, and Re-evaluation  PLAN FOR NEXT  SESSION: HEP review and update, manual techniques as appropriate, aerobic tasks, ROM and flexibility activities, strengthening and PREs, TPDN, gait and balance training as needed     Hildred Laser, PT 04/26/2023, 9:22 AM   Referring diagnosis? B hip pain Treatment diagnosis? (if different than referring diagnosis) B OA of hips What was this (referring dx) caused by? []  Surgery []  Fall []  Ongoing issue [x]  Arthritis []  Other: ____________  Laterality: []  Rt []  Lt [x]  Both  Check all possible CPT codes:  *CHOOSE 10 OR LESS*    [x]  97110 (Therapeutic Exercise)  []  92507 (SLP Treatment)  [x]  97112 (Neuro Re-ed)   []  92526 (Swallowing Treatment)   [x]  97116 (Gait Training)   []  [id number] (Cognitive Training, 1st 15 minutes) [x]  97140 (Manual Therapy)   []  97130 (Cognitive Training, each add'l 15 minutes)  [x]  97164 (Re-evaluation)                              []  Other, List CPT Code ____________  [x]  97530 (Therapeutic Activities)     [x]  97535 (Self Care)   [x]  All codes above (97110 - 97535)  []  97012 (Mechanical Traction)  []  97014 (E-stim Unattended)  []  97032 (E-stim manual)  []  97033 (Ionto)  []  97035 (Ultrasound) []  97750 (Physical Performance Training) []  [id number] (Aquatic Therapy) []  97016 (Vasopneumatic Device) []  [id number] (Paraffin) []  97034 (Contrast Bath) []  97597 (Wound Care 1st 20 sq cm) []  97598 (Wound Care each add'l 20 sq cm) []  97760 (Orthotic Fabrication, Fitting, Training Initial) []  [id number] (Prosthetic Management and Training Initial) []  16109 (Orthotic or Prosthetic Training/ Modification Subsequent)

## 2023-04-27 ENCOUNTER — Ambulatory Visit: Payer: Medicare HMO

## 2023-04-28 ENCOUNTER — Ambulatory Visit: Payer: Self-pay

## 2023-04-28 NOTE — Telephone Encounter (Signed)
Diane from [facility] called again regarding this request, advised her of message below and asked for this to be re faxed.

## 2023-04-28 NOTE — Telephone Encounter (Signed)
PA placed in folder for personal care services

## 2023-04-29 DIAGNOSIS — F4320 Adjustment disorder, unspecified: Secondary | ICD-10-CM | POA: Diagnosis not present

## 2023-04-30 NOTE — Telephone Encounter (Signed)
Done. Please scan and send.  Thanks.

## 2023-05-01 ENCOUNTER — Telehealth: Payer: Self-pay | Admitting: Family Medicine

## 2023-05-01 DIAGNOSIS — Z7401 Bed confinement status: Secondary | ICD-10-CM

## 2023-05-01 NOTE — Telephone Encounter (Signed)
Prescription Request  05/01/2023  LOV: 01/26/2023  What is the name of the medication or equipment? oxyCODONE-acetaminophen (PERCOCET) 10-325 MG tablet [[id number]]   Have you contacted your pharmacy to request a refill? No   Which pharmacy would you like this sent to?  [id number] [place], [place] - [address]  [place] [address] Phone: [phone] Fax: [phone]     Patient notified that their request is being sent to the clinical staff for review and that they should receive a response within 2 business days.   Please advise at Mobile [phone] (mobile)

## 2023-05-01 NOTE — Therapy (Deleted)
OUTPATIENT PHYSICAL THERAPY TREATMENT NOTE   Patient Name: Michael Payne MRN: [id number] DOB:Jun 03, 1971, 52 y.o., male Today's Date: 05/01/2023  END OF SESSION:          Past Medical History:  Diagnosis Date   Anemia    borderline anemia, was instructed to eat more iron filled foods   Cardiac enlargement 06/03/2022   Noted on Xray   CHF (congestive heart failure) (HCC) 05/2021   GERD (gastroesophageal reflux disease)    lactose intolerance, patient denies symptoms of reflux   Hip pain    History of kidney stones    HTN (hypertension)    Morbid obesity (HCC)    Obesity    Pneumonia 05/2021   Pre-diabetes    Sleep apnea    Past Surgical History:  Procedure Laterality Date   FOOT SURGERY Right    KNEE ARTHROSCOPY WITH MEDIAL MENISECTOMY Left 12/16/2014   Procedure: KNEE ARTHROSCOPY WITH MEDIAL MENISECTOMY;  Surgeon: Sheral Apley, MD;  Location: [facility];  Service: Orthopedics;  Laterality: Left;   UPPER GI ENDOSCOPY N/A 06/20/2022   Procedure: UPPER GI ENDOSCOPY;  Surgeon: Quentin Ore, MD;  Location: [facility];  Service: General;  Laterality: N/A;   Patient Active Problem List   Diagnosis Date Noted   Advance care planning 01/29/2023   Skin lesion 01/29/2023   Chronic pain 01/29/2023   Hyperglycemia 11/01/2022   Morbid obesity with BMI of 60.0-69.9, adult (HCC) 06/20/2022   Right shoulder pain 05/11/2022   Hordeolum externum of right eye 02/18/2022   Nonobstructive transaminitis 06/28/2021   Decubitus ulcer of posterior thigh, stage 2 (HCC) 06/28/2021   Obesity, Class III, BMI 40-49.9 (morbid obesity) (HCC) 06/10/2021   Osteoarthritis 06/10/2021   Pressure injury of skin 06/09/2021   Respiratory failure (HCC) 06/01/2021   Acute diastolic CHF (congestive heart failure) (HCC) 05/31/2021   CHF (congestive heart failure) (HCC) 05/31/2021   Hypoxia 05/31/2021   Gout, unspecified 03/10/2021   Edema 03/10/2021   Muscle spasms of both lower extremities  07/29/2020   Bedbound 05/22/2020   Unilateral osteoarthritis of hip, right 12/27/2019   Severe obstructive sleep apnea 02/22/2018   Hypertriglyceridemia 08/24/2017   Low HDL (under 40) 08/24/2017   Metabolic syndrome 08/24/2017   Morbid obesity (HCC) 10/14/2013   HTN (hypertension) 10/14/2013    PCP: Joaquim Nam, MD  REFERRING PROVIDER: Joaquim Nam, MD  REFERRING DIAG: M16.0 (ICD-10-CM) - Osteoarthritis of both hips, unspecified osteoarthritis type  THERAPY DIAG:  No diagnosis found.  Rationale for Evaluation and Treatment: Rehabilitation  ONSET DATE: chronic  SUBJECTIVE:   SUBJECTIVE STATEMENT: Sore from rainy weather  PERTINENT HISTORY:  See PMH  PAIN:  Are you having pain?  Yes: NPRS scale: 5/10 Pain location: B hips Pain description: ache Aggravating factors: activity Relieving factors: medication  PRECAUTIONS: None  WEIGHT BEARING RESTRICTIONS: No  FALLS:  Has patient fallen in last 6 months? No  LIVING ENVIRONMENT: Lives with: lives with their daughter Lives in: House/apartment Stairs:  5 Has following equipment at home: Environmental consultant - 2 wheeled  OCCUPATION: not working  PLOF: Independent  PATIENT GOALS: To manage my hip pain  OBJECTIVE:   DIAGNOSTIC FINDINGS: none  PATIENT SURVEYS:  FOTO: 43(60 predicted); 04/10/23 47  MUSCLE LENGTH: Hamstrings: Right 45 deg; Left 45 deg (measured in seated)  POSTURE:  unable to assess due to body habitus  PALPATION: Deferred due to body habitus  LOWER EXTREMITY ROM: Functional for gait, transfers and bed mobility  ROM Right  eval Left eval  Hip flexion    Hip extension    Hip abduction    Hip adduction    Hip internal rotation    Hip external rotation    Knee flexion    Knee extension    Ankle dorsiflexion    Ankle plantarflexion    Ankle inversion    Ankle eversion     (Blank rows = not tested)  LOWER EXTREMITY MMT:  MMT Right eval Left eval  Hip flexion 3+ 3+  Hip extension  3+ 3+  Hip abduction 3+ 3+  Hip adduction    Hip internal rotation    Hip external rotation    Knee flexion 3+ 3+  Knee extension 3+ 3+  Ankle dorsiflexion    Ankle plantarflexion 3+ 3+  Ankle inversion    Ankle eversion     (Blank rows = not tested)  LOWER EXTREMITY SPECIAL TESTS:  UTA due to body habitus  FUNCTIONAL TESTS:  30 seconds chair stand test 0 reps; 04/10/23 0 reps; 04/12/23 1 rep from high mat table; 04/17/23 2 reps from 22 in height w/o UE support 2 MWT 274ft with RW; 04/10/23 245ft 04/17/23 6 MWT 454ft with rollator  GAIT: Distance walked: 30ftx2 Assistive device utilized: Environmental consultant - 2 wheeled Level of assistance: Complete Independence Comments: slow cadence, flexed poture   TODAY'S TREATMENT:        [facility] Adult PT Treatment:                                                DATE: 04/17/23 Therapeutic Exercise: SAQ 6# 15/15 Supine march 6# 15/15 Supine hip fallouts BlaTB 15x B, 15/15 unilaterally S/L clamshells BlaTB  15/15 FAQs 6# 2x15 B  Standing march 5# 15/15   Therapeutic Activity: 30s chair stand test 2 reps from 22 in height 6 MWT 432ft with rollator  [facility] Adult PT Treatment:                                                DATE: 04/12/23 Therapeutic Exercise: 30s chair stand test 1 reps FAQs 6# 2x15 B  Standing march 5# 15/15 2 bouts Supine hip fallouts BlaTB 15x B, 15/15 unilaterally S/L clamshells BlaTB  15/15 Standing hamstring curls 6# 15x B alternating sets 2 bouts Ambulation with rollator 534ft with several rest periods O2 sats 96% 127 BPM following task  [facility] Adult PT Treatment:                                                DATE: 04/10/23 Therapeutic Exercise: 30s chair stand test 0 reps FAQs 5# 3x15 B  Supine march 5# 15/15 Supine SLR 5# 10/10 Supine hip fallouts BlaTB 15x B, 15/15 unilaterally S/L clamshells BlaTB  15/15 Standing hamstring curls 5# 15x B alternating sets Therapeutic Activity: 2 MWT 215 ft with rollator FOTO  re-assessment  DATE: 03/13/23 Eval and HEP    PATIENT EDUCATION:  Education details: Discussed eval findings, rehab rationale and POC and patient is in agreement  Person educated: Patient Education method: Explanation Education comprehension: verbalized understanding and needs further education  HOME EXERCISE PROGRAM: Access Code: [email or web address] URL: [email or web address] Date: 03/16/2023 Prepared by: Gustavus Bryant  Exercises - Seated Hamstring Stretch  - 2 x daily - 5 x weekly - 1 sets - 2 reps - 30s hold - Standing Heel Raise with Support  - 2 x daily - 5 x weekly - 2 sets - 15 reps - Bilateral Long Arc Quad  - 2 x daily - 5 x weekly - 2 sets - 15 reps  ASSESSMENT:  CLINICAL IMPRESSION: Able to increase reps on 30s chair stand test to 2.  6 MWT distance 451ft (normative distance 1876 ft).  Rainy weather hindered performance today.  Body habitus as well as advanced OA in hips and knees contributing to slow progress.  Recommend continue OPPT 1w4 to maximize function and transition to independent gym program.  Patient presents to first follow up PT session reporting continued BIL hip pain and HEP compliance. Session today focused on LE strengthening and increasing standing activity tolerance as patient is able. He needed frequent rest breaks from standing exercises, but is able to complete all exercises. He is interested in performing more standing exercises than seated as he has already done [facility] with mostly seated exercises and wants to gain strength and standing activity tolerance. Patient continues to benefit from skilled PT services and should be progressed as able to improve functional independence.   OBJECTIVE IMPAIRMENTS: Abnormal gait, decreased activity tolerance, decreased endurance, decreased mobility, difficulty walking, decreased ROM, decreased strength,  improper body mechanics, obesity, and pain.   ACTIVITY LIMITATIONS: bending, sitting, standing, squatting, stairs, transfers, bed mobility, and locomotion level  PERSONAL FACTORS: Age, Fitness, Past/current experiences, Time since onset of injury/illness/exacerbation, and 1 comorbidity: obesity  are also affecting patient's functional outcome.   REHAB POTENTIAL: Fair based on body habitus, chronicity and underlying degenerative changes  CLINICAL DECISION MAKING: Stable/uncomplicated  EVALUATION COMPLEXITY: Low   GOALS: Goals reviewed with patient? No  SHORT TERM GOALS=LONG TERM GOALS: Target date: 04/13/2023   Patient to demonstrate independence in HEP  Baseline: [id number] Goal status: Met  2.  Increase 30s chair stand test to 1 rep Baseline: 0 reps; 04/10/23 0 reps; 04/12/23 1 reps after several attempts; 04/17/23 2 reps from 22 in height Goal status: Ongoing  3.  Increase distance on 2 MWT to 267ft+ Baseline: 284ft with RW; 296ft w/rollator Goal status: Ongoing  4.  Increase BLE strength to 4-/5 Baseline:  MMT Right eval Left eval  Hip flexion 3+ 3+  Hip extension 3+ 3+  Hip abduction 3+ 3+  Hip adduction    Hip internal rotation    Hip external rotation    Knee flexion 3+ 3+  Knee extension 3+ 3+  Ankle dorsiflexion    Ankle plantarflexion 3+ 3+   Goal status: INITIAL  Added 04/17/23 5. Increase FOTO Score to 60   Baseline: 43(60 predicted); 04/10/23 47  Goal Status: Ongoing  6. Increase distance on 6 MWT to 552ft with RW  Baseline 467ft with RW  Goal Status: INITIAL     PLAN:  PT FREQUENCY: 1x/week  PT DURATION: 4 weeks  PLANNED INTERVENTIONS: Therapeutic exercises, Therapeutic activity, Neuromuscular re-education, Balance training, Gait training, Patient/Family education, Self Care, Joint mobilization, Stair training, DME instructions, Aquatic Therapy, Electrical stimulation, Cryotherapy, Moist  heat, Manual therapy, and Re-evaluation  PLAN FOR NEXT  SESSION: HEP review and update, manual techniques as appropriate, aerobic tasks, ROM and flexibility activities, strengthening and PREs, TPDN, gait and balance training as needed     Hildred Laser, PT 05/01/2023, 12:04 PM   Referring diagnosis? B hip pain Treatment diagnosis? (if different than referring diagnosis) B OA of hips What was this (referring dx) caused by? []  Surgery []  Fall []  Ongoing issue [x]  Arthritis []  Other: ____________  Laterality: []  Rt []  Lt [x]  Both  Check all possible CPT codes:  *CHOOSE 10 OR LESS*    [x]  97110 (Therapeutic Exercise)  []  92507 (SLP Treatment)  [x]  97112 (Neuro Re-ed)   []  92526 (Swallowing Treatment)   [x]  97116 (Gait Training)   []  [id number] (Cognitive Training, 1st 15 minutes) [x]  97140 (Manual Therapy)   []  97130 (Cognitive Training, each add'l 15 minutes)  [x]  97164 (Re-evaluation)                              []  Other, List CPT Code ____________  [x]  97530 (Therapeutic Activities)     [x]  97535 (Self Care)   [x]  All codes above (97110 - 97535)  []  97012 (Mechanical Traction)  []  29528 (E-stim Unattended)  []  97032 (E-stim manual)  []  97033 (Ionto)  []  97035 (Ultrasound) []  97750 (Physical Performance Training) []  [id number] (Aquatic Therapy) []  97016 (Vasopneumatic Device) []  [id number] (Paraffin) []  97034 (Contrast Bath) []  97597 (Wound Care 1st 20 sq cm) []  97598 (Wound Care each add'l 20 sq cm) []  97760 (Orthotic Fabrication, Fitting, Training Initial) []  [id number] (Prosthetic Management and Training Initial) []  [id number] (Orthotic or Prosthetic Training/ Modification Subsequent)

## 2023-05-01 NOTE — Telephone Encounter (Signed)
Please give the order.  Thanks.   

## 2023-05-01 NOTE — Telephone Encounter (Signed)
Order has been placed and [employer] notified of order.

## 2023-05-01 NOTE — Telephone Encounter (Signed)
Barbie a nurse from [facility] called in regarding patient. Says patient had a bariatric procedure months ago, currently has a hospital bed and is in need of a new mattress. Says that the DME company will a new Rx sent in to replace the mattress. The DME company is [employer]. Call back number is [phone]

## 2023-05-01 NOTE — Telephone Encounter (Signed)
PA has been faxed.

## 2023-05-01 NOTE — Telephone Encounter (Signed)
LOV - 01/26/23 NOV - not scheduled RF - 01/26/23 #150/0 x 3

## 2023-05-02 MED ORDER — OXYCODONE-ACETAMINOPHEN 10-325 MG PO TABS: 1.0000 | ORAL_TABLET | Freq: Four times a day (QID) | ORAL | 0 refills | Status: DC | PRN

## 2023-05-02 NOTE — Addendum Note (Signed)
Addended by: Joaquim Nam on: 05/02/2023 06:48 AM   Modules accepted: Orders

## 2023-05-03 ENCOUNTER — Ambulatory Visit: Payer: Medicare HMO

## 2023-05-05 ENCOUNTER — Ambulatory Visit: Payer: Self-pay

## 2023-05-08 ENCOUNTER — Encounter: Payer: Self-pay | Admitting: Family Medicine

## 2023-05-08 NOTE — Therapy (Unsigned)
OUTPATIENT PHYSICAL THERAPY TREATMENT NOTE   Patient Name: Michael Payne MRN: [id number] DOB:05-Nov-1970, 52 y.o., male Today's Date: 05/10/2023  END OF SESSION:  PT End of Session - 05/10/23 1137     Visit Number 9    Number of Visits 12    Date for PT Re-Evaluation 05/11/23    Authorization Type [employer] MCR    PT Start Time 1136    PT Stop Time 1215    PT Time Calculation (min) 39 min    Activity Tolerance Patient tolerated treatment well    Behavior During Therapy WFL for tasks assessed/performed            Past Medical History:  Diagnosis Date   Anemia    borderline anemia, was instructed to eat more iron filled foods   Cardiac enlargement 06/03/2022   Noted on Xray   CHF (congestive heart failure) (HCC) 05/2021   GERD (gastroesophageal reflux disease)    lactose intolerance, patient denies symptoms of reflux   Hip pain    History of kidney stones    HTN (hypertension)    Morbid obesity (HCC)    Obesity    Pneumonia 05/2021   Pre-diabetes    Sleep apnea    Past Surgical History:  Procedure Laterality Date   FOOT SURGERY Right    KNEE ARTHROSCOPY WITH MEDIAL MENISECTOMY Left 12/16/2014   Procedure: KNEE ARTHROSCOPY WITH MEDIAL MENISECTOMY;  Surgeon: Sheral Apley, MD;  Location: [facility] OR;  Service: Orthopedics;  Laterality: Left;   UPPER GI ENDOSCOPY N/A 06/20/2022   Procedure: UPPER GI ENDOSCOPY;  Surgeon: Quentin Ore, MD;  Location: [facility];  Service: General;  Laterality: N/A;   Patient Active Problem List   Diagnosis Date Noted   Advance care planning 01/29/2023   Skin lesion 01/29/2023   Chronic pain 01/29/2023   Hyperglycemia 11/01/2022   Morbid obesity with BMI of 60.0-69.9, adult (HCC) 06/20/2022   Right shoulder pain 05/11/2022   Hordeolum externum of right eye 02/18/2022   Nonobstructive transaminitis 06/28/2021   Decubitus ulcer of posterior thigh, stage 2 (HCC) 06/28/2021   Obesity, Class III, BMI 40-49.9 (morbid obesity) (HCC)  06/10/2021   Osteoarthritis 06/10/2021   Pressure injury of skin 06/09/2021   Respiratory failure (HCC) 06/01/2021   Acute diastolic CHF (congestive heart failure) (HCC) 05/31/2021   CHF (congestive heart failure) (HCC) 05/31/2021   Hypoxia 05/31/2021   Gout, unspecified 03/10/2021   Edema 03/10/2021   Muscle spasms of both lower extremities 07/29/2020   Bedbound 05/22/2020   Unilateral osteoarthritis of hip, right 12/27/2019   Severe obstructive sleep apnea 02/22/2018   Hypertriglyceridemia 08/24/2017   Low HDL (under 40) 08/24/2017   Metabolic syndrome 08/24/2017   Morbid obesity (HCC) 10/14/2013   HTN (hypertension) 10/14/2013    PCP: Joaquim Nam, MD  REFERRING PROVIDER: Joaquim Nam, MD  REFERRING DIAG: M16.0 (ICD-10-CM) - Osteoarthritis of both hips, unspecified osteoarthritis type  THERAPY DIAG:  Pain of both hip joints  Other abnormalities of gait and mobility  Muscle weakness (generalized)  Rationale for Evaluation and Treatment: Rehabilitation  ONSET DATE: chronic  SUBJECTIVE:   SUBJECTIVE STATEMENT: Pain levels 4-5/10, has been compliant with HEP  PERTINENT HISTORY:  See PMH  PAIN:  Are you having pain?  Yes: NPRS scale: 5/10 Pain location: B hips Pain description: ache Aggravating factors: activity Relieving factors: medication  PRECAUTIONS: None  WEIGHT BEARING RESTRICTIONS: No  FALLS:  Has patient fallen in last 6 months? No  LIVING ENVIRONMENT: Lives with: lives with their daughter Lives in: House/apartment Stairs:  5 Has following equipment at home: Environmental consultant - 2 wheeled  OCCUPATION: not working  PLOF: Independent  PATIENT GOALS: To manage my hip pain  OBJECTIVE:   DIAGNOSTIC FINDINGS: none  PATIENT SURVEYS:  FOTO: 43(60 predicted); 04/10/23 47  MUSCLE LENGTH: Hamstrings: Right 45 deg; Left 45 deg (measured in seated)  POSTURE:  unable to assess due to body habitus  PALPATION: Deferred due to body habitus  LOWER  EXTREMITY ROM: Functional for gait, transfers and bed mobility  ROM Right eval Left eval  Hip flexion    Hip extension    Hip abduction    Hip adduction    Hip internal rotation    Hip external rotation    Knee flexion    Knee extension    Ankle dorsiflexion    Ankle plantarflexion    Ankle inversion    Ankle eversion     (Blank rows = not tested)  LOWER EXTREMITY MMT:  MMT Right eval Left eval  Hip flexion 3+ 3+  Hip extension 3+ 3+  Hip abduction 3+ 3+  Hip adduction    Hip internal rotation    Hip external rotation    Knee flexion 3+ 3+  Knee extension 3+ 3+  Ankle dorsiflexion    Ankle plantarflexion 3+ 3+  Ankle inversion    Ankle eversion     (Blank rows = not tested)  LOWER EXTREMITY SPECIAL TESTS:  UTA due to body habitus  FUNCTIONAL TESTS:  30 seconds chair stand test 0 reps; 04/10/23 0 reps; 04/12/23 1 rep from high mat table; 04/17/23 2 reps from 22 in height w/o UE support; 05/10/23 5 reps from 22 in height 2 MWT 267ft with RW; 04/10/23 265ft 04/17/23 6 MWT 469ft with rollator; 05/10/23 6 MWT with rollator 520ft  GAIT: Distance walked: 34ftx2 Assistive device utilized: Environmental consultant - 2 wheeled Level of assistance: Complete Independence Comments: slow cadence, flexed poture   TODAY'S TREATMENT:        [facility] Adult PT Treatment:                                                DATE: 05/10/23 Therapeutic Exercise: Supine SLR 15/15 PKB 15/15 Prone hip IR/ER 15/15 focus on IR  Therapeutic Activity: 6 MWT 505 ft with rollator 30s chair stand test 5 reps from 22 in height  [facility] Adult PT Treatment:                                                DATE: 04/17/23 Therapeutic Exercise: SAQ 6# 15/15 Supine march 6# 15/15 Supine hip fallouts BlaTB 15x B, 15/15 unilaterally S/L clamshells BlaTB  15/15 FAQs 6# 2x15 B  Standing march 5# 15/15   Therapeutic Activity: 30s chair stand test 2 reps from 22 in height 6 MWT 466ft with rollator  [facility] Adult PT Treatment:                                                 DATE: 04/12/23 Therapeutic Exercise: 30s chair  stand test 1 reps FAQs 6# 2x15 B  Standing march 5# 15/15 2 bouts Supine hip fallouts BlaTB 15x B, 15/15 unilaterally S/L clamshells BlaTB  15/15 Standing hamstring curls 6# 15x B alternating sets 2 bouts Ambulation with rollator 534ft with several rest periods O2 sats 96% 127 BPM following task  [facility] Adult PT Treatment:                                                DATE: 04/10/23 Therapeutic Exercise: 30s chair stand test 0 reps FAQs 5# 3x15 B  Supine march 5# 15/15 Supine SLR 5# 10/10 Supine hip fallouts BlaTB 15x B, 15/15 unilaterally S/L clamshells BlaTB  15/15 Standing hamstring curls 5# 15x B alternating sets Therapeutic Activity: 2 MWT 215 ft with rollator FOTO re-assessment                                                                                                                     DATE: 03/13/23 Eval and HEP    PATIENT EDUCATION:  Education details: Discussed eval findings, rehab rationale and POC and patient is in agreement  Person educated: Patient Education method: Explanation Education comprehension: verbalized understanding and needs further education  HOME EXERCISE PROGRAM: Access Code: [email or web address] URL: [email or web address] Date: 03/16/2023 Prepared by: Gustavus Bryant  Exercises - Seated Hamstring Stretch  - 2 x daily - 5 x weekly - 1 sets - 2 reps - 30s hold - Standing Heel Raise with Support  - 2 x daily - 5 x weekly - 2 sets - 15 reps - Bilateral Long Arc Quad  - 2 x daily - 5 x weekly - 2 sets - 15 reps  ASSESSMENT:  CLINICAL IMPRESSION: Able to increase reps on 30s chair stand test to 2.  6 MWT distance 489ft (normative distance 1876 ft).  Rainy weather hindered performance today.  Body habitus as well as advanced OA in hips and knees contributing to slow progress.  Recommend continue OPPT 1w4 to maximize function and transition to independent  gym program.  Patient presents to first follow up PT session reporting continued BIL hip pain and HEP compliance. Session today focused on LE strengthening and increasing standing activity tolerance as patient is able. He needed frequent rest breaks from standing exercises, but is able to complete all exercises. He is interested in performing more standing exercises than seated as he has already done [facility] with mostly seated exercises and wants to gain strength and standing activity tolerance. Patient continues to benefit from skilled PT services and should be progressed as able to improve functional independence.   OBJECTIVE IMPAIRMENTS: Abnormal gait, decreased activity tolerance, decreased endurance, decreased mobility, difficulty walking, decreased ROM, decreased strength, improper body mechanics, obesity, and pain.   ACTIVITY LIMITATIONS: bending, sitting, standing, squatting, stairs, transfers, bed mobility, and locomotion level  PERSONAL FACTORS: Age, Fitness, Past/current experiences, Time since  onset of injury/illness/exacerbation, and 1 comorbidity: obesity  are also affecting patient's functional outcome.   REHAB POTENTIAL: Fair based on body habitus, chronicity and underlying degenerative changes  CLINICAL DECISION MAKING: Stable/uncomplicated  EVALUATION COMPLEXITY: Low   GOALS: Goals reviewed with patient? No  SHORT TERM GOALS=LONG TERM GOALS: Target date: 04/13/2023   Patient to demonstrate independence in HEP  Baseline: [id number] Goal status: Met  2.  Increase 30s chair stand test to 1 rep Baseline: 0 reps; 04/10/23 0 reps; 04/12/23 1 reps after several attempts; 04/17/23 2 reps from 22 in height; 05/10/23 5 reps from 22 in height Goal status: Ongoing  3.  Increase distance on 2 MWT to 269ft+ Baseline: 261ft with RW; 261ft w/rollator Goal status: Met  4.  Increase BLE strength to 4-/5 Baseline:  MMT Right eval Left eval  Hip flexion 3+ 3+  Hip extension 3+ 3+  Hip  abduction 3+ 3+  Hip adduction    Hip internal rotation    Hip external rotation    Knee flexion 3+ 3+  Knee extension 3+ 3+  Ankle dorsiflexion    Ankle plantarflexion 3+ 3+   Goal status: INITIAL  Added 04/17/23 5. Increase FOTO Score to 60   Baseline: 43(60 predicted); 04/10/23 47  Goal Status: Ongoing  6. Increase distance on 6 MWT to 54ft with RW; 05/10/23 Goal updated to 629ft  Baseline 452ft with RW; 05/10/23 505 ft  Goal Status: INITIAL     PLAN:  PT FREQUENCY: 1x/week  PT DURATION: 4 weeks  PLANNED INTERVENTIONS: Therapeutic exercises, Therapeutic activity, Neuromuscular re-education, Balance training, Gait training, Patient/Family education, Self Care, Joint mobilization, Stair training, DME instructions, Aquatic Therapy, Electrical stimulation, Cryotherapy, Moist heat, Manual therapy, and Re-evaluation  PLAN FOR NEXT SESSION: HEP review and update, manual techniques as appropriate, aerobic tasks, ROM and flexibility activities, strengthening and PREs, TPDN, gait and balance training as needed     Hildred Laser, PT 05/10/2023, 12:57 PM   Referring diagnosis? B hip pain Treatment diagnosis? (if different than referring diagnosis) B OA of hips What was this (referring dx) caused by? []  Surgery []  Fall []  Ongoing issue [x]  Arthritis []  Other: ____________  Laterality: []  Rt []  Lt [x]  Both  Check all possible CPT codes:  *CHOOSE 10 OR LESS*    [x]  97110 (Therapeutic Exercise)  []  92507 (SLP Treatment)  [x]  97112 (Neuro Re-ed)   []  92526 (Swallowing Treatment)   [x]  97116 (Gait Training)   []  [id number] (Cognitive Training, 1st 15 minutes) [x]  97140 (Manual Therapy)   []  97130 (Cognitive Training, each add'l 15 minutes)  [x]  97164 (Re-evaluation)                              []  Other, List CPT Code ____________  [x]  97530 (Therapeutic Activities)     [x]  97535 (Self Care)   [x]  All codes above (97110 - 97535)  []  97012 (Mechanical Traction)  []  97014  (E-stim Unattended)  []  97032 (E-stim manual)  []  97033 (Ionto)  []  97035 (Ultrasound) []  97750 (Physical Performance Training) []  [id number] (Aquatic Therapy) []  97016 (Vasopneumatic Device) []  [id number] (Paraffin) []  97034 (Contrast Bath) []  97597 (Wound Care 1st 20 sq cm) []  97598 (Wound Care each add'l 20 sq cm) []  63875 (Orthotic Fabrication, Fitting, Training Initial) []  [id number] (Prosthetic Management and Training Initial) []  [id number] (Orthotic or Prosthetic Training/ Modification Subsequent)

## 2023-05-10 ENCOUNTER — Ambulatory Visit: Payer: Medicare HMO | Attending: Family Medicine

## 2023-05-10 DIAGNOSIS — M6281 Muscle weakness (generalized): Secondary | ICD-10-CM | POA: Diagnosis not present

## 2023-05-10 DIAGNOSIS — R2689 Other abnormalities of gait and mobility: Secondary | ICD-10-CM | POA: Diagnosis not present

## 2023-05-10 DIAGNOSIS — M25551 Pain in right hip: Secondary | ICD-10-CM | POA: Diagnosis not present

## 2023-05-10 DIAGNOSIS — M25552 Pain in left hip: Secondary | ICD-10-CM | POA: Diagnosis not present

## 2023-05-10 NOTE — Progress Notes (Signed)
   Physician Documentation  Your signature is required to indicate approval of the treatment plan as stated above. By signing this report, you are approving the plan of care. Please sign and either send electronically or print and fax the signed copy to the number below. If you approve with modifications, please indicate those in the space provided.__ Physician Signature: ___Graham Para March _____ Date:___08/21/24 ____ Time:___1:02 PM __

## 2023-05-13 DIAGNOSIS — F4322 Adjustment disorder with anxiety: Secondary | ICD-10-CM | POA: Diagnosis not present

## 2023-05-16 NOTE — Therapy (Unsigned)
OUTPATIENT PHYSICAL THERAPY TREATMENT NOTE   Patient Name: Michael Payne MRN: [id number] DOB:[birth year], 52 y.o., male Today's Date: 05/17/2023  END OF SESSION:  PT End of Session - 05/17/23 1127     Visit Number 10    Number of Visits 12    Date for PT Re-Evaluation 05/11/23    Authorization Type [employer] MCR    PT Start Time 1130    PT Stop Time 1210    PT Time Calculation (min) 40 min    Activity Tolerance Patient tolerated treatment well    Behavior During Therapy WFL for tasks assessed/performed             Past Medical History:  Diagnosis Date   Anemia    borderline anemia, was instructed to eat more iron filled foods   Cardiac enlargement 06/03/2022   Noted on Xray   CHF (congestive heart failure) (HCC) 05/2021   GERD (gastroesophageal reflux disease)    lactose intolerance, patient denies symptoms of reflux   Hip pain    History of kidney stones    HTN (hypertension)    Morbid obesity (HCC)    Obesity    Pneumonia 05/2021   Pre-diabetes    Sleep apnea    Past Surgical History:  Procedure Laterality Date   FOOT SURGERY Right    KNEE ARTHROSCOPY WITH MEDIAL MENISECTOMY Left 12/16/2014   Procedure: KNEE ARTHROSCOPY WITH MEDIAL MENISECTOMY;  Surgeon: Sheral Apley, MD;  Location: [facility];  Service: Orthopedics;  Laterality: Left;   UPPER GI ENDOSCOPY N/A 06/20/2022   Procedure: UPPER GI ENDOSCOPY;  Surgeon: Quentin Ore, MD;  Location: [facility];  Service: General;  Laterality: N/A;   Patient Active Problem List   Diagnosis Date Noted   Advance care planning 01/29/2023   Skin lesion 01/29/2023   Chronic pain 01/29/2023   Hyperglycemia 11/01/2022   Morbid obesity with BMI of 60.0-69.9, adult (HCC) 06/20/2022   Right shoulder pain 05/11/2022   Hordeolum externum of right eye 02/18/2022   Nonobstructive transaminitis 06/28/2021   Decubitus ulcer of posterior thigh, stage 2 (HCC) 06/28/2021   Obesity, Class III, BMI 40-49.9 (morbid obesity) (HCC)  06/10/2021   Osteoarthritis 06/10/2021   Pressure injury of skin 06/09/2021   Respiratory failure (HCC) 06/01/2021   Acute diastolic CHF (congestive heart failure) (HCC) 05/31/2021   CHF (congestive heart failure) (HCC) 05/31/2021   Hypoxia 05/31/2021   Gout, unspecified 03/10/2021   Edema 03/10/2021   Muscle spasms of both lower extremities 07/29/2020   Bedbound 05/22/2020   Unilateral osteoarthritis of hip, right 12/27/2019   Severe obstructive sleep apnea 02/22/2018   Hypertriglyceridemia 08/24/2017   Low HDL (under 40) 08/24/2017   Metabolic syndrome 08/24/2017   Morbid obesity (HCC) 10/14/2013   HTN (hypertension) 10/14/2013    PCP: Joaquim Nam, MD  REFERRING PROVIDER: Joaquim Nam, MD  REFERRING DIAG: M16.0 (ICD-10-CM) - Osteoarthritis of both hips, unspecified osteoarthritis type  THERAPY DIAG:  Pain of both hip joints  Other abnormalities of gait and mobility  Muscle weakness (generalized)  Rationale for Evaluation and Treatment: Rehabilitation  ONSET DATE: chronic  SUBJECTIVE:   SUBJECTIVE STATEMENT: No changes to report.  Remains compliant with HEP and has lost 4 pounds since last session.  PERTINENT HISTORY:  See PMH  PAIN:  Are you having pain?  Yes: NPRS scale: 5/10 Pain location: B hips Pain description: ache Aggravating factors: activity Relieving factors: medication  PRECAUTIONS: None  WEIGHT BEARING RESTRICTIONS: No  FALLS:  Has patient fallen in last 6 months? No  LIVING ENVIRONMENT: Lives with: lives with their daughter Lives in: House/apartment Stairs:  5 Has following equipment at home: Environmental consultant - 2 wheeled  OCCUPATION: not working  PLOF: Independent  PATIENT GOALS: To manage my hip pain  OBJECTIVE:   DIAGNOSTIC FINDINGS: none  PATIENT SURVEYS:  FOTO: 43(60 predicted); 04/10/23 47  MUSCLE LENGTH: Hamstrings: Right 45 deg; Left 45 deg (measured in seated)  POSTURE:  unable to assess due to body  habitus  PALPATION: Deferred due to body habitus  LOWER EXTREMITY ROM: Functional for gait, transfers and bed mobility  ROM Right eval Left eval  Hip flexion    Hip extension    Hip abduction    Hip adduction    Hip internal rotation    Hip external rotation    Knee flexion    Knee extension    Ankle dorsiflexion    Ankle plantarflexion    Ankle inversion    Ankle eversion     (Blank rows = not tested)  LOWER EXTREMITY MMT:  MMT Right eval Left eval  Hip flexion 3+ 3+  Hip extension 3+ 3+  Hip abduction 3+ 3+  Hip adduction    Hip internal rotation    Hip external rotation    Knee flexion 3+ 3+  Knee extension 3+ 3+  Ankle dorsiflexion    Ankle plantarflexion 3+ 3+  Ankle inversion    Ankle eversion     (Blank rows = not tested)  LOWER EXTREMITY SPECIAL TESTS:  UTA due to body habitus  FUNCTIONAL TESTS:  30 seconds chair stand test 0 reps; 04/10/23 0 reps; 04/12/23 1 rep from high mat table; 04/17/23 2 reps from 22 in height w/o UE support; 05/10/23 5 reps from 22 in height 2 MWT 270ft with RW; 04/10/23 270ft 04/17/23 6 MWT 455ft with rollator; 05/10/23 6 MWT with rollator 537ft  GAIT: Distance walked: 83ftx2 Assistive device utilized: Environmental consultant - 2 wheeled Level of assistance: Complete Independence Comments: slow cadence, flexed poture   TODAY'S TREATMENT:        [facility] Adult PT Treatment:                                                DATE: 05/17/23 Therapeutic Exercise: Seated hamstring stretch 30s x2 B B PKB with ball squeeze 15x2 PKB 15/15 Prone hip IR/ER 15/15 focus on IR Bridge with ball squeeze 15x 2 FAQs with ball squeeze 15x2 Standing heelcord stretch 30s x2  Therapeutic Activity: 6 MWT (no distance recorded)  [facility] Adult PT Treatment:                                                DATE: 05/10/23 Therapeutic Exercise: Supine SLR 15/15 PKB 15/15 Prone hip IR/ER 15/15 focus on IR  Therapeutic Activity: 6 MWT 505 ft with rollator 30s chair stand  test 5 reps from 22 in height  [facility] Adult PT Treatment:  DATE: 04/17/23 Therapeutic Exercise: SAQ 6# 15/15 Supine march 6# 15/15 Supine hip fallouts BlaTB 15x B, 15/15 unilaterally S/L clamshells BlaTB  15/15 FAQs 6# 2x15 B  Standing march 5# 15/15   Therapeutic Activity: 30s chair stand test 2 reps from 22 in height 6 MWT 429ft with rollator  [facility] Adult PT Treatment:                                                DATE: 04/12/23 Therapeutic Exercise: 30s chair stand test 1 reps FAQs 6# 2x15 B  Standing march 5# 15/15 2 bouts Supine hip fallouts BlaTB 15x B, 15/15 unilaterally S/L clamshells BlaTB  15/15 Standing hamstring curls 6# 15x B alternating sets 2 bouts Ambulation with rollator 581ft with several rest periods O2 sats 96% 127 BPM following task  [facility] Adult PT Treatment:                                                DATE: 04/10/23 Therapeutic Exercise: 30s chair stand test 0 reps FAQs 5# 3x15 B  Supine march 5# 15/15 Supine SLR 5# 10/10 Supine hip fallouts BlaTB 15x B, 15/15 unilaterally S/L clamshells BlaTB  15/15 Standing hamstring curls 5# 15x B alternating sets Therapeutic Activity: 2 MWT 215 ft with rollator FOTO re-assessment                                                                                                                     DATE: 03/13/23 Eval and HEP    PATIENT EDUCATION:  Education details: Discussed eval findings, rehab rationale and POC and patient is in agreement  Person educated: Patient Education method: Explanation Education comprehension: verbalized understanding and needs further education  HOME EXERCISE PROGRAM: Access Code: [email or web address] URL: [email or web address] Date: 03/16/2023 Prepared by: Gustavus Bryant  Exercises - Seated Hamstring Stretch  - 2 x daily - 5 x weekly - 1 sets - 2 reps - 30s hold - Standing Heel Raise with Support  - 2 x daily - 5 x weekly - 2 sets -  15 reps - Bilateral Long Arc Quad  - 2 x daily - 5 x weekly - 2 sets - 15 reps  ASSESSMENT:  CLINICAL IMPRESSION: Incorporated additional tasks to attempt to promote IR at hips.  Marked restrictions in B hip capsules limited ability to demonstrate appreciable IR.  Emphasis of todays session was stertching.  Discussed ongoing care following OPPT DC over next 2 sessions.  Patient presents to first follow up PT session reporting continued BIL hip pain and HEP compliance. Session today focused on LE strengthening and increasing standing activity tolerance as patient is able. He needed frequent rest breaks from standing exercises, but is able to complete all exercises. He  is interested in performing more standing exercises than seated as he has already done [facility] with mostly seated exercises and wants to gain strength and standing activity tolerance. Patient continues to benefit from skilled PT services and should be progressed as able to improve functional independence.   OBJECTIVE IMPAIRMENTS: Abnormal gait, decreased activity tolerance, decreased endurance, decreased mobility, difficulty walking, decreased ROM, decreased strength, improper body mechanics, obesity, and pain.   ACTIVITY LIMITATIONS: bending, sitting, standing, squatting, stairs, transfers, bed mobility, and locomotion level  PERSONAL FACTORS: Age, Fitness, Past/current experiences, Time since onset of injury/illness/exacerbation, and 1 comorbidity: obesity  are also affecting patient's functional outcome.   REHAB POTENTIAL: Fair based on body habitus, chronicity and underlying degenerative changes  CLINICAL DECISION MAKING: Stable/uncomplicated  EVALUATION COMPLEXITY: Low   GOALS: Goals reviewed with patient? No  SHORT TERM GOALS=LONG TERM GOALS: Target date: 04/13/2023   Patient to demonstrate independence in HEP  Baseline: [id number] Goal status: Met  2.  Increase 30s chair stand test to 1 rep Baseline: 0 reps; 04/10/23 0  reps; 04/12/23 1 reps after several attempts; 04/17/23 2 reps from 22 in height; 05/10/23 5 reps from 22 in height Goal status: Ongoing  3.  Increase distance on 2 MWT to 228ft+ Baseline: 210ft with RW; 233ft w/rollator Goal status: Met  4.  Increase BLE strength to 4-/5 Baseline:  MMT Right eval Left eval  Hip flexion 3+ 3+  Hip extension 3+ 3+  Hip abduction 3+ 3+  Hip adduction    Hip internal rotation    Hip external rotation    Knee flexion 3+ 3+  Knee extension 3+ 3+  Ankle dorsiflexion    Ankle plantarflexion 3+ 3+   Goal status: INITIAL  Added 04/17/23 5. Increase FOTO Score to 60   Baseline: 43(60 predicted); 04/10/23 47  Goal Status: Ongoing  6. Increase distance on 6 MWT to 547ft with RW; 05/10/23 Goal updated to 616ft  Baseline 463ft with RW; 05/10/23 505 ft  Goal Status: INITIAL     PLAN:  PT FREQUENCY: 1x/week  PT DURATION: 4 weeks  PLANNED INTERVENTIONS: Therapeutic exercises, Therapeutic activity, Neuromuscular re-education, Balance training, Gait training, Patient/Family education, Self Care, Joint mobilization, Stair training, DME instructions, Aquatic Therapy, Electrical stimulation, Cryotherapy, Moist heat, Manual therapy, and Re-evaluation  PLAN FOR NEXT SESSION: HEP review and update, manual techniques as appropriate, aerobic tasks, ROM and flexibility activities, strengthening and PREs, TPDN, gait and balance training as needed     Hildred Laser, PT 05/17/2023, 12:13 PM   Referring diagnosis? B hip pain Treatment diagnosis? (if different than referring diagnosis) B OA of hips What was this (referring dx) caused by? []  Surgery []  Fall []  Ongoing issue [x]  Arthritis []  Other: ____________  Laterality: []  Rt []  Lt [x]  Both  Check all possible CPT codes:  *CHOOSE 10 OR LESS*    [x]  97110 (Therapeutic Exercise)  []  92507 (SLP Treatment)  [x]  97112 (Neuro Re-ed)   []  92526 (Swallowing Treatment)   [x]  97116 (Gait Training)   []  [id number]  (Cognitive Training, 1st 15 minutes) [x]  97140 (Manual Therapy)   []  97130 (Cognitive Training, each add'l 15 minutes)  [x]  97164 (Re-evaluation)                              []  Other, List CPT Code ____________  [x]  97530 (Therapeutic Activities)     [x]  97535 (Self Care)   [x]  All codes above (97110 -  78295)  []  97012 (Mechanical Traction)  []  97014 (E-stim Unattended)  []  97032 (E-stim manual)  []  97033 (Ionto)  []  97035 (Ultrasound) []  97750 (Physical Performance Training) []  62130 (Aquatic Therapy) []  97016 (Vasopneumatic Device) []  [id number] (Paraffin) []  97034 (Contrast Bath) []  97597 (Wound Care 1st 20 sq cm) []  97598 (Wound Care each add'l 20 sq cm) []  97760 (Orthotic Fabrication, Fitting, Training Initial) []  [id number] (Prosthetic Management and Training Initial) []  [id number] (Orthotic or Prosthetic Training/ Modification Subsequent)

## 2023-05-17 ENCOUNTER — Ambulatory Visit: Payer: Medicare HMO

## 2023-05-17 DIAGNOSIS — R2689 Other abnormalities of gait and mobility: Secondary | ICD-10-CM

## 2023-05-17 DIAGNOSIS — M25551 Pain in right hip: Secondary | ICD-10-CM | POA: Diagnosis not present

## 2023-05-17 DIAGNOSIS — M6281 Muscle weakness (generalized): Secondary | ICD-10-CM

## 2023-05-17 DIAGNOSIS — M25552 Pain in left hip: Secondary | ICD-10-CM | POA: Diagnosis not present

## 2023-05-27 DIAGNOSIS — F4322 Adjustment disorder with anxiety: Secondary | ICD-10-CM | POA: Diagnosis not present

## 2023-05-31 ENCOUNTER — Ambulatory Visit: Payer: Medicare HMO

## 2023-06-01 ENCOUNTER — Other Ambulatory Visit: Payer: Self-pay | Admitting: Family Medicine

## 2023-06-06 NOTE — Therapy (Deleted)
OUTPATIENT PHYSICAL THERAPY TREATMENT NOTE   Patient Name: Michael Payne MRN: [id number] DOB:1971/02/06, 52 y.o., male Today's Date: 06/06/2023  END OF SESSION:    Past Medical History:  Diagnosis Date   Anemia    borderline anemia, was instructed to eat more iron filled foods   Cardiac enlargement 06/03/2022   Noted on Xray   CHF (congestive heart failure) (HCC) 05/2021   GERD (gastroesophageal reflux disease)    lactose intolerance, patient denies symptoms of reflux   Hip pain    History of kidney stones    HTN (hypertension)    Morbid obesity (HCC)    Obesity    Pneumonia 05/2021   Pre-diabetes    Sleep apnea    Past Surgical History:  Procedure Laterality Date   FOOT SURGERY Right    KNEE ARTHROSCOPY WITH MEDIAL MENISECTOMY Left 12/16/2014   Procedure: KNEE ARTHROSCOPY WITH MEDIAL MENISECTOMY;  Surgeon: Sheral Apley, MD;  Location: [facility];  Service: Orthopedics;  Laterality: Left;   UPPER GI ENDOSCOPY N/A 06/20/2022   Procedure: UPPER GI ENDOSCOPY;  Surgeon: Quentin Ore, MD;  Location: [facility];  Service: General;  Laterality: N/A;   Patient Active Problem List   Diagnosis Date Noted   Advance care planning 01/29/2023   Skin lesion 01/29/2023   Chronic pain 01/29/2023   Hyperglycemia 11/01/2022   Morbid obesity with BMI of 60.0-69.9, adult (HCC) 06/20/2022   Right shoulder pain 05/11/2022   Hordeolum externum of right eye 02/18/2022   Nonobstructive transaminitis 06/28/2021   Decubitus ulcer of posterior thigh, stage 2 (HCC) 06/28/2021   Obesity, Class III, BMI 40-49.9 (morbid obesity) (HCC) 06/10/2021   Osteoarthritis 06/10/2021   Pressure injury of skin 06/09/2021   Respiratory failure (HCC) 06/01/2021   Acute diastolic CHF (congestive heart failure) (HCC) 05/31/2021   CHF (congestive heart failure) (HCC) 05/31/2021   Hypoxia 05/31/2021   Gout, unspecified 03/10/2021   Edema 03/10/2021   Muscle spasms of both lower extremities 07/29/2020    Bedbound 05/22/2020   Unilateral osteoarthritis of hip, right 12/27/2019   Severe obstructive sleep apnea 02/22/2018   Hypertriglyceridemia 08/24/2017   Low HDL (under 40) 08/24/2017   Metabolic syndrome 08/24/2017   Morbid obesity (HCC) 10/14/2013   HTN (hypertension) 10/14/2013    PCP: Joaquim Nam, MD  REFERRING PROVIDER: Joaquim Nam, MD  REFERRING DIAG: M16.0 (ICD-10-CM) - Osteoarthritis of both hips, unspecified osteoarthritis type  THERAPY DIAG:  No diagnosis found.  Rationale for Evaluation and Treatment: Rehabilitation  ONSET DATE: chronic  SUBJECTIVE:   SUBJECTIVE STATEMENT: No changes to report.  Remains compliant with HEP and has lost 4 pounds since last session.  PERTINENT HISTORY:  See PMH  PAIN:  Are you having pain?  Yes: NPRS scale: 5/10 Pain location: B hips Pain description: ache Aggravating factors: activity Relieving factors: medication  PRECAUTIONS: None  WEIGHT BEARING RESTRICTIONS: No  FALLS:  Has patient fallen in last 6 months? No  LIVING ENVIRONMENT: Lives with: lives with their daughter Lives in: House/apartment Stairs:  5 Has following equipment at home: Environmental consultant - 2 wheeled  OCCUPATION: not working  PLOF: Independent  PATIENT GOALS: To manage my hip pain  OBJECTIVE:   DIAGNOSTIC FINDINGS: none  PATIENT SURVEYS:  FOTO: 43(60 predicted); 04/10/23 47  MUSCLE LENGTH: Hamstrings: Right 45 deg; Left 45 deg (measured in seated)  POSTURE:  unable to assess due to body habitus  PALPATION: Deferred due to body habitus  LOWER EXTREMITY ROM: Functional for gait,  transfers and bed mobility  ROM Right eval Left eval  Hip flexion    Hip extension    Hip abduction    Hip adduction    Hip internal rotation    Hip external rotation    Knee flexion    Knee extension    Ankle dorsiflexion    Ankle plantarflexion    Ankle inversion    Ankle eversion     (Blank rows = not tested)  LOWER EXTREMITY MMT:  MMT  Right eval Left eval  Hip flexion 3+ 3+  Hip extension 3+ 3+  Hip abduction 3+ 3+  Hip adduction    Hip internal rotation    Hip external rotation    Knee flexion 3+ 3+  Knee extension 3+ 3+  Ankle dorsiflexion    Ankle plantarflexion 3+ 3+  Ankle inversion    Ankle eversion     (Blank rows = not tested)  LOWER EXTREMITY SPECIAL TESTS:  UTA due to body habitus  FUNCTIONAL TESTS:  30 seconds chair stand test 0 reps; 04/10/23 0 reps; 04/12/23 1 rep from high mat table; 04/17/23 2 reps from 22 in height w/o UE support; 05/10/23 5 reps from 22 in height 2 MWT 257ft with RW; 04/10/23 212ft 04/17/23 6 MWT 453ft with rollator; 05/10/23 6 MWT with rollator 561ft  GAIT: Distance walked: 75ftx2 Assistive device utilized: Environmental consultant - 2 wheeled Level of assistance: Complete Independence Comments: slow cadence, flexed poture   TODAY'S TREATMENT:        [facility] Adult PT Treatment:                                                DATE: 05/17/23 Therapeutic Exercise: Seated hamstring stretch 30s x2 B B PKB with ball squeeze 15x2 PKB 15/15 Prone hip IR/ER 15/15 focus on IR Bridge with ball squeeze 15x 2 FAQs with ball squeeze 15x2 Standing heelcord stretch 30s x2  Therapeutic Activity: 6 MWT (no distance recorded)  [facility] Adult PT Treatment:                                                DATE: 05/10/23 Therapeutic Exercise: Supine SLR 15/15 PKB 15/15 Prone hip IR/ER 15/15 focus on IR  Therapeutic Activity: 6 MWT 505 ft with rollator 30s chair stand test 5 reps from 22 in height  [facility] Adult PT Treatment:                                                DATE: 04/17/23 Therapeutic Exercise: SAQ 6# 15/15 Supine march 6# 15/15 Supine hip fallouts BlaTB 15x B, 15/15 unilaterally S/L clamshells BlaTB  15/15 FAQs 6# 2x15 B  Standing march 5# 15/15   Therapeutic Activity: 30s chair stand test 2 reps from 22 in height 6 MWT 4109ft with rollator  [facility] Adult PT Treatment:  DATE: 04/12/23 Therapeutic Exercise: 30s chair stand test 1 reps FAQs 6# 2x15 B  Standing march 5# 15/15 2 bouts Supine hip fallouts BlaTB 15x B, 15/15 unilaterally S/L clamshells BlaTB  15/15 Standing hamstring curls 6# 15x B alternating sets 2 bouts Ambulation with rollator 524ft with several rest periods O2 sats 96% 127 BPM following task  [facility] Adult PT Treatment:                                                DATE: 04/10/23 Therapeutic Exercise: 30s chair stand test 0 reps FAQs 5# 3x15 B  Supine march 5# 15/15 Supine SLR 5# 10/10 Supine hip fallouts BlaTB 15x B, 15/15 unilaterally S/L clamshells BlaTB  15/15 Standing hamstring curls 5# 15x B alternating sets Therapeutic Activity: 2 MWT 215 ft with rollator FOTO re-assessment                                                                                                                     DATE: 03/13/23 Eval and HEP    PATIENT EDUCATION:  Education details: Discussed eval findings, rehab rationale and POC and patient is in agreement  Person educated: Patient Education method: Explanation Education comprehension: verbalized understanding and needs further education  HOME EXERCISE PROGRAM: Access Code: [email or web address] URL: [email or web address] Date: 03/16/2023 Prepared by: Gustavus Bryant  Exercises - Seated Hamstring Stretch  - 2 x daily - 5 x weekly - 1 sets - 2 reps - 30s hold - Standing Heel Raise with Support  - 2 x daily - 5 x weekly - 2 sets - 15 reps - Bilateral Long Arc Quad  - 2 x daily - 5 x weekly - 2 sets - 15 reps  ASSESSMENT:  CLINICAL IMPRESSION: Incorporated additional tasks to attempt to promote IR at hips.  Marked restrictions in B hip capsules limited ability to demonstrate appreciable IR.  Emphasis of todays session was stertching.  Discussed ongoing care following OPPT DC over next 2 sessions.  Patient presents to first follow up PT session reporting continued BIL hip pain  and HEP compliance. Session today focused on LE strengthening and increasing standing activity tolerance as patient is able. He needed frequent rest breaks from standing exercises, but is able to complete all exercises. He is interested in performing more standing exercises than seated as he has already done [facility] with mostly seated exercises and wants to gain strength and standing activity tolerance. Patient continues to benefit from skilled PT services and should be progressed as able to improve functional independence.   OBJECTIVE IMPAIRMENTS: Abnormal gait, decreased activity tolerance, decreased endurance, decreased mobility, difficulty walking, decreased ROM, decreased strength, improper body mechanics, obesity, and pain.   ACTIVITY LIMITATIONS: bending, sitting, standing, squatting, stairs, transfers, bed mobility, and locomotion level  PERSONAL FACTORS: Age, Fitness, Past/current experiences, Time since onset of injury/illness/exacerbation, and 1 comorbidity: obesity  are  also affecting patient's functional outcome.   REHAB POTENTIAL: Fair based on body habitus, chronicity and underlying degenerative changes  CLINICAL DECISION MAKING: Stable/uncomplicated  EVALUATION COMPLEXITY: Low   GOALS: Goals reviewed with patient? No  SHORT TERM GOALS=LONG TERM GOALS: Target date: 04/13/2023   Patient to demonstrate independence in HEP  Baseline: [id number] Goal status: Met  2.  Increase 30s chair stand test to 1 rep Baseline: 0 reps; 04/10/23 0 reps; 04/12/23 1 reps after several attempts; 04/17/23 2 reps from 22 in height; 05/10/23 5 reps from 22 in height Goal status: Ongoing  3.  Increase distance on 2 MWT to 238ft+ Baseline: 230ft with RW; 237ft w/rollator Goal status: Met  4.  Increase BLE strength to 4-/5 Baseline:  MMT Right eval Left eval  Hip flexion 3+ 3+  Hip extension 3+ 3+  Hip abduction 3+ 3+  Hip adduction    Hip internal rotation    Hip external rotation    Knee flexion  3+ 3+  Knee extension 3+ 3+  Ankle dorsiflexion    Ankle plantarflexion 3+ 3+   Goal status: INITIAL  Added 04/17/23 5. Increase FOTO Score to 60   Baseline: 43(60 predicted); 04/10/23 47  Goal Status: Ongoing  6. Increase distance on 6 MWT to 59ft with RW; 05/10/23 Goal updated to 650ft  Baseline 451ft with RW; 05/10/23 505 ft  Goal Status: INITIAL     PLAN:  PT FREQUENCY: 1x/week  PT DURATION: 4 weeks  PLANNED INTERVENTIONS: Therapeutic exercises, Therapeutic activity, Neuromuscular re-education, Balance training, Gait training, Patient/Family education, Self Care, Joint mobilization, Stair training, DME instructions, Aquatic Therapy, Electrical stimulation, Cryotherapy, Moist heat, Manual therapy, and Re-evaluation  PLAN FOR NEXT SESSION: HEP review and update, manual techniques as appropriate, aerobic tasks, ROM and flexibility activities, strengthening and PREs, TPDN, gait and balance training as needed     Hildred Laser, PT 06/06/2023, 11:48 AM   Referring diagnosis? B hip pain Treatment diagnosis? (if different than referring diagnosis) B OA of hips What was this (referring dx) caused by? []  Surgery []  Fall []  Ongoing issue [x]  Arthritis []  Other: ____________  Laterality: []  Rt []  Lt [x]  Both  Check all possible CPT codes:  *CHOOSE 10 OR LESS*    [x]  64332 (Therapeutic Exercise)  []  92507 (SLP Treatment)  [x]  97112 (Neuro Re-ed)   []  92526 (Swallowing Treatment)   [x]  97116 (Gait Training)   []  [id number] (Cognitive Training, 1st 15 minutes) [x]  97140 (Manual Therapy)   []  97130 (Cognitive Training, each add'l 15 minutes)  [x]  97164 (Re-evaluation)                              []  Other, List CPT Code ____________  [x]  97530 (Therapeutic Activities)     [x]  97535 (Self Care)   [x]  All codes above (97110 - 97535)  []  97012 (Mechanical Traction)  []  97014 (E-stim Unattended)  []  97032 (E-stim manual)  []  97033 (Ionto)  []  97035 (Ultrasound) []  97750  (Physical Performance Training) []  [id number] (Aquatic Therapy) []  97016 (Vasopneumatic Device) []  [id number] (Paraffin) []  97034 (Contrast Bath) []  97597 (Wound Care 1st 20 sq cm) []  97598 (Wound Care each add'l 20 sq cm) []  97760 (Orthotic Fabrication, Fitting, Training Initial) []  [id number] (Prosthetic Management and Training Initial) []  [id number] (Orthotic or Prosthetic Training/ Modification Subsequent)

## 2023-06-07 ENCOUNTER — Ambulatory Visit: Payer: Medicare HMO

## 2023-06-17 DIAGNOSIS — F4322 Adjustment disorder with anxiety: Secondary | ICD-10-CM | POA: Diagnosis not present

## 2023-06-22 ENCOUNTER — Ambulatory Visit: Payer: Medicare HMO | Attending: Family Medicine

## 2023-06-22 DIAGNOSIS — M6281 Muscle weakness (generalized): Secondary | ICD-10-CM | POA: Insufficient documentation

## 2023-06-22 DIAGNOSIS — M25552 Pain in left hip: Secondary | ICD-10-CM | POA: Diagnosis not present

## 2023-06-22 DIAGNOSIS — R2689 Other abnormalities of gait and mobility: Secondary | ICD-10-CM | POA: Insufficient documentation

## 2023-06-22 DIAGNOSIS — M25551 Pain in right hip: Secondary | ICD-10-CM | POA: Diagnosis not present

## 2023-06-22 NOTE — Therapy (Signed)
OUTPATIENT PHYSICAL THERAPY TREATMENT NOTE   Patient Name: Michael Payne MRN: [id number] DOB:1971-08-14, 52 y.o., male Today's Date: 06/22/2023  END OF SESSION:  PT End of Session - 06/22/23 1819     Visit Number 11    Number of Visits 12    Date for PT Re-Evaluation 05/11/23    Authorization Type [employer] MCR    PT Start Time 0200    PT Stop Time 0250    PT Time Calculation (min) 50 min    Activity Tolerance Patient tolerated treatment well;Patient limited by fatigue    Behavior During Therapy [facility] for tasks assessed/performed              Past Medical History:  Diagnosis Date   Anemia    borderline anemia, was instructed to eat more iron filled foods   Cardiac enlargement 06/03/2022   Noted on Xray   CHF (congestive heart failure) (HCC) 05/2021   GERD (gastroesophageal reflux disease)    lactose intolerance, patient denies symptoms of reflux   Hip pain    History of kidney stones    HTN (hypertension)    Morbid obesity (HCC)    Obesity    Pneumonia 05/2021   Pre-diabetes    Sleep apnea    Past Surgical History:  Procedure Laterality Date   FOOT SURGERY Right    KNEE ARTHROSCOPY WITH MEDIAL MENISECTOMY Left 12/16/2014   Procedure: KNEE ARTHROSCOPY WITH MEDIAL MENISECTOMY;  Surgeon: Sheral Apley, MD;  Location: [facility];  Service: Orthopedics;  Laterality: Left;   UPPER GI ENDOSCOPY N/A 06/20/2022   Procedure: UPPER GI ENDOSCOPY;  Surgeon: Quentin Ore, MD;  Location: [facility];  Service: General;  Laterality: N/A;   Patient Active Problem List   Diagnosis Date Noted   Advance care planning 01/29/2023   Skin lesion 01/29/2023   Chronic pain 01/29/2023   Hyperglycemia 11/01/2022   Morbid obesity with BMI of 60.0-69.9, adult (HCC) 06/20/2022   Right shoulder pain 05/11/2022   Hordeolum externum of right eye 02/18/2022   Nonobstructive transaminitis 06/28/2021   Decubitus ulcer of posterior thigh, stage 2 (HCC) 06/28/2021   Obesity, Class III, BMI  40-49.9 (morbid obesity) (HCC) 06/10/2021   Osteoarthritis 06/10/2021   Pressure injury of skin 06/09/2021   Respiratory failure (HCC) 06/01/2021   Acute diastolic CHF (congestive heart failure) (HCC) 05/31/2021   CHF (congestive heart failure) (HCC) 05/31/2021   Hypoxia 05/31/2021   Gout, unspecified 03/10/2021   Edema 03/10/2021   Muscle spasms of both lower extremities 07/29/2020   Bedbound 05/22/2020   Unilateral osteoarthritis of hip, right 12/27/2019   Severe obstructive sleep apnea 02/22/2018   Hypertriglyceridemia 08/24/2017   Low HDL (under 40) 08/24/2017   Metabolic syndrome 08/24/2017   Morbid obesity (HCC) 10/14/2013   HTN (hypertension) 10/14/2013    PCP: Joaquim Nam, MD  REFERRING PROVIDER: Joaquim Nam, MD  REFERRING DIAG: M16.0 (ICD-10-CM) - Osteoarthritis of both hips, unspecified osteoarthritis type  THERAPY DIAG:  Pain of both hip joints  Other abnormalities of gait and mobility  Muscle weakness (generalized)  Rationale for Evaluation and Treatment: Rehabilitation  ONSET DATE: chronic  SUBJECTIVE:   SUBJECTIVE STATEMENT:   Patient reports that he feels he has not made improvements with PT. His pain as been around 4-5/10.  He feels that he would like to continue physical therapy with increased challenge in order to further progress towards goals.  Patient states that he is regularly active, performing home exercises and going  to the gym with a personal trainer.  He would like to continue working on getting his leg stronger, regaining independence, reducing use of walker with his cane if needed.  He states that he is able to stand to make meals but requires seated rest breaks within <10-minute bouts.   PERTINENT HISTORY:  See PMH  PAIN:  Are you having pain?  Yes: NPRS scale: 5/10 Pain location: B hips Pain description: ache Aggravating factors: activity Relieving factors: medication  PRECAUTIONS: None  WEIGHT BEARING RESTRICTIONS:  No  FALLS:  Has patient fallen in last 6 months? No  LIVING ENVIRONMENT: Lives with: lives with their daughter Lives in: House/apartment Stairs:  5 Has following equipment at home: Environmental consultant - 2 wheeled  OCCUPATION: not working  PLOF: Independent  PATIENT GOALS: To manage my hip pain  OBJECTIVE:   DIAGNOSTIC FINDINGS: none  PATIENT SURVEYS:  FOTO: 43(60 predicted); 04/10/23 47  MUSCLE LENGTH: Hamstrings: Right 45 deg; Left 45 deg (measured in seated)  POSTURE:  unable to assess due to body habitus  PALPATION: Deferred due to body habitus  LOWER EXTREMITY ROM: Functional for gait, transfers and bed mobility   LOWER EXTREMITY MMT:  MMT Right eval Left eval Right 06/22/23 Left 06/22/23  Hip flexion 3+ 3+ 4 4  Hip extension 3+ 3+    Hip abduction 3+ 3+    Hip adduction      Hip internal rotation      Hip external rotation      Knee flexion 3+ 3+    Knee extension 3+ 3+ 4- 4-  Ankle dorsiflexion      Ankle plantarflexion 3+ 3+    Ankle inversion      Ankle eversion         LOWER EXTREMITY SPECIAL TESTS:  UTA due to body habitus  FUNCTIONAL TESTS:  30 seconds chair stand test 0 reps;  04/10/23 0 reps; 04/12/23 1 rep from high mat table 04/17/23 2 reps from 22 in height w/o UE support 05/10/23 5 reps from 22 in height  2 MWT 223ft with RW;  04/10/23 247ft  6 MWT 04/17/23 6 MWT 417ft with rollator;  05/10/23 6 MWT with rollator 564ft  GAIT: Distance walked: 70ftx2 Assistive device utilized: Environmental consultant - 2 wheeled Level of assistance: Complete Independence Comments: slow cadence, flexed posture   TODAY'S TREATMENT:          [facility] Adult PT Treatment:                                                DATE: 06/22/2023  Therapeutic Exercise: 2 x 15 p-ring hip adduction  2 x 15 supine single leg clamshell  2 x 5 sidelying reverse clamshells 15 glute bridges   Therapeutic Activity:  Reassessment of objective measures and subjective assessment regarding progress  towards established goals and plan for updated POC, including updated HEP to address ongoing functional deficits as noted in assessment.  PATIENT EDUCATION:  Education details: Discussed eval findings, rehab rationale and POC and patient is in agreement  Person educated: Patient Education method: Explanation Education comprehension: verbalized understanding and needs further education  HOME EXERCISE PROGRAM: Access Code: [email or web address] URL: [email or web address] Date: 06/22/2023 Prepared by: Mauri Reading  Exercises - Seated Hamstring Stretch  - 2 x daily - 5 x weekly - 2 reps - 30s hold - Sidelying Reverse Clamshell  - 1 x daily - 7 x weekly - 2 sets - 10 reps - Clamshell  - 1 x daily - 7 x weekly - 2 sets - 10 reps - Supine Bridge with Mini Swiss Ball Between Knees  - 1 x daily - 7 x weekly - 2 sets - 10 reps - Sitting Knee Extension with Resistance  - 1 x daily - 7 x weekly - 2 sets - 10 reps  ASSESSMENT:  CLINICAL IMPRESSION:   Patient has attended 10 PT sessions prior to today, and is demonstrating improvements with performance of sit to stand transfer, lower extremity MMT, walking tolerance and pain severity.  He continues to be limited with hip and knee strength bilaterally, standing and walking tolerance, and is reliant on rollator.  Patient will benefit from continued skilled physical therapy once a week for 6 additional weeks to address ongoing deficits, and progress towards patient's specific functional goals including use of LRAD, and maximization of independent function.    OBJECTIVE IMPAIRMENTS: Abnormal gait, decreased activity tolerance, decreased endurance, decreased mobility, difficulty walking, decreased ROM, decreased strength, improper body mechanics, obesity, and pain.   ACTIVITY LIMITATIONS: bending, sitting, standing, squatting, stairs,  transfers, bed mobility, and locomotion level  PERSONAL FACTORS: Age, Fitness, Past/current experiences, Time since onset of injury/illness/exacerbation, and 1 comorbidity: obesity  are also affecting patient's functional outcome.   REHAB POTENTIAL: Fair based on body habitus, chronicity and underlying degenerative changes  CLINICAL DECISION MAKING: Stable/uncomplicated  EVALUATION COMPLEXITY: Low   GOALS: Goals reviewed with patient? No  LONG TERM GOALS: Target date: 08/03/2023, last updated 06/22/23   Patient to demonstrate independence in HEP  Baseline: [id number] Goal status: MET  2.  Increase 30s chair stand test to 1 rep Baseline: 0 reps;  04/10/23 0 reps;  04/12/23 1 reps after several attempts;  04/17/23 2 reps from 22 in height; 05/10/23 5 reps from 22 in height Goal status: DISCONTINUE  3.  Increase distance on 2 MWT to 263ft+ Baseline: 228ft with RW; 284ft w/rollator Goal status: MET  4.  Increase BLE strength to 4-/5 Baseline:  MMT Right eval Left eval  Hip flexion 3+ 3+  Hip extension 3+ 3+  Hip abduction 3+ 3+  Hip adduction    Hip internal rotation    Hip external rotation    Knee flexion 3+ 3+  Knee extension 3+ 3+  Ankle dorsiflexion    Ankle plantarflexion 3+ 3+  Ankle inversion    Ankle eversion     Goal status: DISCONTINUE  Added 04/17/23  5. Increase FOTO Score to 60   Baseline: 43(60 predicted);  04/10/23 47 06/22/23: 47  Goal Status: ONGOING  6. Increase distance on 6 MWT to 567ft with RW --- UPDATED GOALS: 600 ft on 05/10/23   Baseline 456ft with RW;  05/10/23 505 ft  Goal Status: PROGRESSING  Added 06/22/23:   7.  Increase 30s chair stand test to 5 or more repetitions with seat height no greater than 17-20"  Baseline: 5 reps from 22 in height Goal status: PROGRESSING  8. Increase BLE  strength to 4/5 Baseline:  MMT Right eval Left eval Right 06/22/23 Left 06/22/23  Hip flexion 3+ 3+ 4 4  Hip extension 3+ 3+    Hip abduction 3+  3+    Hip adduction      Hip internal rotation      Hip external rotation      Knee flexion 3+ 3+    Knee extension 3+ 3+ 4- 4-  Ankle dorsiflexion      Ankle plantarflexion 3+ 3+    Ankle inversion      Ankle eversion        Goal Status: PROGRESSING    PLAN:  PT FREQUENCY: 1x/week  PT DURATION: 6 weeks, last updated 06/22/23  PLANNED INTERVENTIONS: Therapeutic exercises, Therapeutic activity, Neuromuscular re-education, Balance training, Gait training, Patient/Family education, Self Care, Joint mobilization, Stair training, DME instructions, Aquatic Therapy, Electrical stimulation, Cryotherapy, Moist heat, Manual therapy, and Re-evaluation  PLAN FOR NEXT SESSION: HEP review and update, manual techniques as appropriate, aerobic activity to address activity tolerance/endurance deficits, LE strengthening in all directions, hip adduction/flexion mobility activities, increased standing tolerance progression.    Mauri Reading, PT, DPT   06/22/2023, 6:20 PM   Referring diagnosis? B hip pain Treatment diagnosis? (if different than referring diagnosis) B OA of hips What was this (referring dx) caused by? []  Surgery []  Fall []  Ongoing issue [x]  Arthritis []  Other: ____________  Laterality: []  Rt []  Lt [x]  Both  Check all possible CPT codes:  *CHOOSE 10 OR LESS*    [x]  97110 (Therapeutic Exercise)  []  92507 (SLP Treatment)  [x]  97112 (Neuro Re-ed)   []  92526 (Swallowing Treatment)   [x]  97116 (Gait Training)   []  [id number] (Cognitive Training, 1st 15 minutes) [x]  97140 (Manual Therapy)   []  97130 (Cognitive Training, each add'l 15 minutes)  [x]  97164 (Re-evaluation)                              []  Other, List CPT Code ____________  [x]  97530 (Therapeutic Activities)     [x]  97535 (Self Care)   [x]  All codes above (97110 - 97535)  []  97012 (Mechanical Traction)  []  97014 (E-stim Unattended)  []  97032 (E-stim manual)  []  97033 (Ionto)  []  97035 (Ultrasound) []  97750 (Physical  Performance Training) []  [id number] (Aquatic Therapy) []  97016 (Vasopneumatic Device) []  [id number] (Paraffin) []  97034 (Contrast Bath) []  97597 (Wound Care 1st 20 sq cm) []  97598 (Wound Care each add'l 20 sq cm) []  97760 (Orthotic Fabrication, Fitting, Training Initial) []  [id number] (Prosthetic Management and Training Initial) []  [id number] (Orthotic or Prosthetic Training/ Modification Subsequent)

## 2023-06-28 ENCOUNTER — Ambulatory Visit: Payer: Self-pay

## 2023-06-28 DIAGNOSIS — M6281 Muscle weakness (generalized): Secondary | ICD-10-CM | POA: Diagnosis not present

## 2023-06-28 DIAGNOSIS — R2689 Other abnormalities of gait and mobility: Secondary | ICD-10-CM

## 2023-06-28 DIAGNOSIS — M25551 Pain in right hip: Secondary | ICD-10-CM | POA: Diagnosis not present

## 2023-06-28 DIAGNOSIS — M25552 Pain in left hip: Secondary | ICD-10-CM | POA: Diagnosis not present

## 2023-06-28 NOTE — Therapy (Signed)
OUTPATIENT PHYSICAL THERAPY TREATMENT NOTE   Patient Name: Michael Payne MRN: [id number] DOB:Oct 16, 1970, 52 y.o., male Today's Date: 06/28/2023  END OF SESSION:  PT End of Session - 06/28/23 0958     Visit Number 12    Date for PT Re-Evaluation 08/03/23    Authorization Type [employer] M[employer]    Authorization Time Period 6 visits 06/22/23-07/22/23    Authorization - Visit Number 1    Authorization - Number of Visits 6    PT Start Time 1000    PT Stop Time 1040    PT Time Calculation (min) 40 min    Activity Tolerance Patient tolerated treatment well;Patient limited by fatigue    Behavior During Therapy [facility] for tasks assessed/performed              Past Medical History:  Diagnosis Date   Anemia    borderline anemia, was instructed to eat more iron filled foods   Cardiac enlargement 06/03/2022   Noted on Xray   CHF (congestive heart failure) (HCC) 05/2021   GERD (gastroesophageal reflux disease)    lactose intolerance, patient denies symptoms of reflux   Hip pain    History of kidney stones    HTN (hypertension)    Morbid obesity (HCC)    Obesity    Pneumonia 05/2021   Pre-diabetes    Sleep apnea    Past Surgical History:  Procedure Laterality Date   FOOT SURGERY Right    KNEE ARTHROSCOPY WITH MEDIAL MENISECTOMY Left 12/16/2014   Procedure: KNEE ARTHROSCOPY WITH MEDIAL MENISECTOMY;  Surgeon: Sheral Apley, MD;  Location: [facility] OR;  Service: Orthopedics;  Laterality: Left;   UPPER GI ENDOSCOPY N/A 06/20/2022   Procedure: UPPER GI ENDOSCOPY;  Surgeon: Quentin Ore, MD;  Location: [facility];  Service: General;  Laterality: N/A;   Patient Active Problem List   Diagnosis Date Noted   Advance care planning 01/29/2023   Skin lesion 01/29/2023   Chronic pain 01/29/2023   Hyperglycemia 11/01/2022   Morbid obesity with BMI of 60.0-69.9, adult (HCC) 06/20/2022   Right shoulder pain 05/11/2022   Hordeolum externum of right eye 02/18/2022   Nonobstructive transaminitis  06/28/2021   Decubitus ulcer of posterior thigh, stage 2 (HCC) 06/28/2021   Obesity, Class III, BMI 40-49.9 (morbid obesity) (HCC) 06/10/2021   Osteoarthritis 06/10/2021   Pressure injury of skin 06/09/2021   Respiratory failure (HCC) 06/01/2021   Acute diastolic CHF (congestive heart failure) (HCC) 05/31/2021   CHF (congestive heart failure) (HCC) 05/31/2021   Hypoxia 05/31/2021   Gout, unspecified 03/10/2021   Edema 03/10/2021   Muscle spasms of both lower extremities 07/29/2020   Bedbound 05/22/2020   Unilateral osteoarthritis of hip, right 12/27/2019   Severe obstructive sleep apnea 02/22/2018   Hypertriglyceridemia 08/24/2017   Low HDL (under 40) 08/24/2017   Metabolic syndrome 08/24/2017   Morbid obesity (HCC) 10/14/2013   HTN (hypertension) 10/14/2013    PCP: Joaquim Nam, MD  REFERRING PROVIDER: Joaquim Nam, MD  REFERRING DIAG: M16.0 (ICD-10-CM) - Osteoarthritis of both hips, unspecified osteoarthritis type  THERAPY DIAG:  Pain of both hip joints  Other abnormalities of gait and mobility  Muscle weakness (generalized)  Rationale for Evaluation and Treatment: Rehabilitation  ONSET DATE: chronic  SUBJECTIVE:   SUBJECTIVE STATEMENT:  Patient reports 4/10 pain today and that he has been compliant with his HEP.    PERTINENT HISTORY:  See PMH  PAIN:  Are you having pain?  Yes: NPRS scale: 5/10  Pain location: B hips Pain description: ache Aggravating factors: activity Relieving factors: medication  PRECAUTIONS: None  WEIGHT BEARING RESTRICTIONS: No  FALLS:  Has patient fallen in last 6 months? No  LIVING ENVIRONMENT: Lives with: lives with their daughter Lives in: House/apartment Stairs:  5 Has following equipment at home: Environmental consultant - 2 wheeled  OCCUPATION: not working  PLOF: Independent  PATIENT GOALS: To manage my hip pain  OBJECTIVE:   DIAGNOSTIC FINDINGS: none  PATIENT SURVEYS:  FOTO: 43(60 predicted); 04/10/23 47  MUSCLE  LENGTH: Hamstrings: Right 45 deg; Left 45 deg (measured in seated)  POSTURE:  unable to assess due to body habitus  PALPATION: Deferred due to body habitus  LOWER EXTREMITY ROM: Functional for gait, transfers and bed mobility   LOWER EXTREMITY MMT:  MMT Right eval Left eval Right 06/22/23 Left 06/22/23  Hip flexion 3+ 3+ 4 4  Hip extension 3+ 3+    Hip abduction 3+ 3+    Hip adduction      Hip internal rotation      Hip external rotation      Knee flexion 3+ 3+    Knee extension 3+ 3+ 4- 4-  Ankle dorsiflexion      Ankle plantarflexion 3+ 3+    Ankle inversion      Ankle eversion         LOWER EXTREMITY SPECIAL TESTS:  UTA due to body habitus  FUNCTIONAL TESTS:  30 seconds chair stand test 0 reps;  04/10/23 0 reps; 04/12/23 1 rep from high mat table 04/17/23 2 reps from 22 in height w/o UE support 05/10/23 5 reps from 22 in height  2 MWT 276ft with RW;  04/10/23 275ft  6 MWT 04/17/23 6 MWT 452ft with rollator;  05/10/23 6 MWT with rollator 553ft  GAIT: Distance walked: 30ftx2 Assistive device utilized: Environmental consultant - 2 wheeled Level of assistance: Complete Independence Comments: slow cadence, flexed posture   TODAY'S TREATMENT:        [facility] Adult PT Treatment:                                                DATE: 06/28/23 Therapeutic Exercise: Standing hamstring curl 2x10 BIL Standing hip abduction 2x10 BIL Standing hip extension 2x10 BIL Standing heel raises 2x10 Seated LAQ 4# with hold at top 2x10 BIL Seated marching 4# 3x30" Seated hamstring curl BlackTB doubled 2x10 BIL Gait rolled into therex with SPC x40 ft - cues for sequence    [facility] Adult PT Treatment:                                                DATE: 06/22/2023  Therapeutic Exercise: 2 x 15 p-ring hip adduction  2 x 15 supine single leg clamshell  2 x 5 sidelying reverse clamshells 15 glute bridges   Therapeutic Activity:  Reassessment of objective measures and subjective assessment regarding  progress towards established goals and plan for updated POC, including updated HEP to address ongoing functional deficits as noted in assessment.  PATIENT EDUCATION:  Education details: Discussed eval findings, rehab rationale and POC and patient is in agreement  Person educated: Patient Education method: Explanation Education comprehension: verbalized understanding and needs further education  HOME EXERCISE PROGRAM: Access Code: [email or web address] URL: [email or web address] Date: 06/22/2023 Prepared by: Mauri Reading  Exercises - Seated Hamstring Stretch  - 2 x daily - 5 x weekly - 2 reps - 30s hold - Sidelying Reverse Clamshell  - 1 x daily - 7 x weekly - 2 sets - 10 reps - Clamshell  - 1 x daily - 7 x weekly - 2 sets - 10 reps - Supine Bridge with Mini Swiss Ball Between Knees  - 1 x daily - 7 x weekly - 2 sets - 10 reps - Sitting Knee Extension with Resistance  - 1 x daily - 7 x weekly - 2 sets - 10 reps  ASSESSMENT:  CLINICAL IMPRESSION:  Patient presents to PT reporting 4/10 pain in hips and knees and that he has been compliant with his HEP. Session today focused on LE strengthening and improving standing activity tolerance with more volume of standing exercises to moderate effect, he does need occasional sitting rest breaks to due to fatigue. Began [facility] gait training today, he fatigues quickly with this. Patient was able to tolerate all prescribed exercises with no adverse effects. Patient continues to benefit from skilled PT services and should be progressed as able to improve functional independence.    OBJECTIVE IMPAIRMENTS: Abnormal gait, decreased activity tolerance, decreased endurance, decreased mobility, difficulty walking, decreased ROM, decreased strength, improper body mechanics, obesity, and pain.   ACTIVITY LIMITATIONS: bending, sitting, standing,  squatting, stairs, transfers, bed mobility, and locomotion level  PERSONAL FACTORS: Age, Fitness, Past/current experiences, Time since onset of injury/illness/exacerbation, and 1 comorbidity: obesity  are also affecting patient's functional outcome.   REHAB POTENTIAL: Fair based on body habitus, chronicity and underlying degenerative changes  CLINICAL DECISION MAKING: Stable/uncomplicated  EVALUATION COMPLEXITY: Low   GOALS: Goals reviewed with patient? No  LONG TERM GOALS: Target date: 08/03/2023, last updated 06/22/23   Patient to demonstrate independence in HEP  Baseline: [id number] Goal status: MET  2.  Increase 30s chair stand test to 1 rep Baseline: 0 reps;  04/10/23 0 reps;  04/12/23 1 reps after several attempts;  04/17/23 2 reps from 22 in height; 05/10/23 5 reps from 22 in height Goal status: DISCONTINUE  3.  Increase distance on 2 MWT to 245ft+ Baseline: 258ft with RW; 278ft w/rollator Goal status: MET  4.  Increase BLE strength to 4-/5 Baseline:  MMT Right eval Left eval  Hip flexion 3+ 3+  Hip extension 3+ 3+  Hip abduction 3+ 3+  Hip adduction    Hip internal rotation    Hip external rotation    Knee flexion 3+ 3+  Knee extension 3+ 3+  Ankle dorsiflexion    Ankle plantarflexion 3+ 3+  Ankle inversion    Ankle eversion     Goal status: DISCONTINUE  Added 04/17/23  5. Increase FOTO Score to 60   Baseline: 43(60 predicted);  04/10/23 47 06/22/23: 47  Goal Status: ONGOING  6. Increase distance on 6 MWT to 575ft with RW --- UPDATED GOALS: 600 ft on 05/10/23   Baseline 427ft with RW;  05/10/23 505 ft  Goal Status: PROGRESSING  Added 06/22/23:   7.  Increase 30s chair stand test to 5 or more repetitions with seat height no greater than 17-20"  Baseline: 5 reps from 22 in height Goal status:  PROGRESSING  8. Increase BLE strength to 4/5 Baseline:  MMT Right eval Left eval Right 06/22/23 Left 06/22/23  Hip flexion 3+ 3+ 4 4  Hip extension 3+ 3+     Hip abduction 3+ 3+    Hip adduction      Hip internal rotation      Hip external rotation      Knee flexion 3+ 3+    Knee extension 3+ 3+ 4- 4-  Ankle dorsiflexion      Ankle plantarflexion 3+ 3+    Ankle inversion      Ankle eversion        Goal Status: PROGRESSING    PLAN:  PT FREQUENCY: 1x/week  PT DURATION: 6 weeks, last updated 06/22/23  PLANNED INTERVENTIONS: Therapeutic exercises, Therapeutic activity, Neuromuscular re-education, Balance training, Gait training, Patient/Family education, Self Care, Joint mobilization, Stair training, DME instructions, Aquatic Therapy, Electrical stimulation, Cryotherapy, Moist heat, Manual therapy, and Re-evaluation  PLAN FOR NEXT SESSION: HEP review and update, manual techniques as appropriate, aerobic activity to address activity tolerance/endurance deficits, LE strengthening in all directions, hip adduction/flexion mobility activities, increased standing tolerance progression.    Berta Minor PTA 06/28/2023, 10:42 AM   Referring diagnosis? B hip pain Treatment diagnosis? (if different than referring diagnosis) B OA of hips What was this (referring dx) caused by? []  Surgery []  Fall []  Ongoing issue [x]  Arthritis []  Other: ____________  Laterality: []  Rt []  Lt [x]  Both  Check all possible CPT codes:  *CHOOSE 10 OR LESS*    [x]  97110 (Therapeutic Exercise)  []  92507 (SLP Treatment)  [x]  97112 (Neuro Re-ed)   []  92526 (Swallowing Treatment)   [x]  97116 (Gait Training)   []  [id number] (Cognitive Training, 1st 15 minutes) [x]  97140 (Manual Therapy)   []  97130 (Cognitive Training, each add'l 15 minutes)  [x]  97164 (Re-evaluation)                              []  Other, List CPT Code ____________  [x]  97530 (Therapeutic Activities)     [x]  97535 (Self Care)   [x]  All codes above (97110 - 97535)  []  97012 (Mechanical Traction)  []  97014 (E-stim Unattended)  []  97032 (E-stim manual)  []  97033 (Ionto)  []  97035 (Ultrasound) []   97750 (Physical Performance Training) []  [id number] (Aquatic Therapy) []  97016 (Vasopneumatic Device) []  [id number] (Paraffin) []  97034 (Contrast Bath) []  97597 (Wound Care 1st 20 sq cm) []  97598 (Wound Care each add'l 20 sq cm) []  97760 (Orthotic Fabrication, Fitting, Training Initial) []  [id number] (Prosthetic Management and Training Initial) []  [id number] (Orthotic or Prosthetic Training/ Modification Subsequent)

## 2023-06-29 ENCOUNTER — Ambulatory Visit: Payer: Medicare HMO

## 2023-07-01 DIAGNOSIS — F4322 Adjustment disorder with anxiety: Secondary | ICD-10-CM | POA: Diagnosis not present

## 2023-07-03 IMAGING — DX DG CHEST 1V PORT
1 series · 1 of 1 positions shown · non-contrast
Comparison: Earlier same day

CLINICAL DATA: Central line placement and intubation

EXAM:
PORTABLE CHEST 1 VIEW

[chest ap]
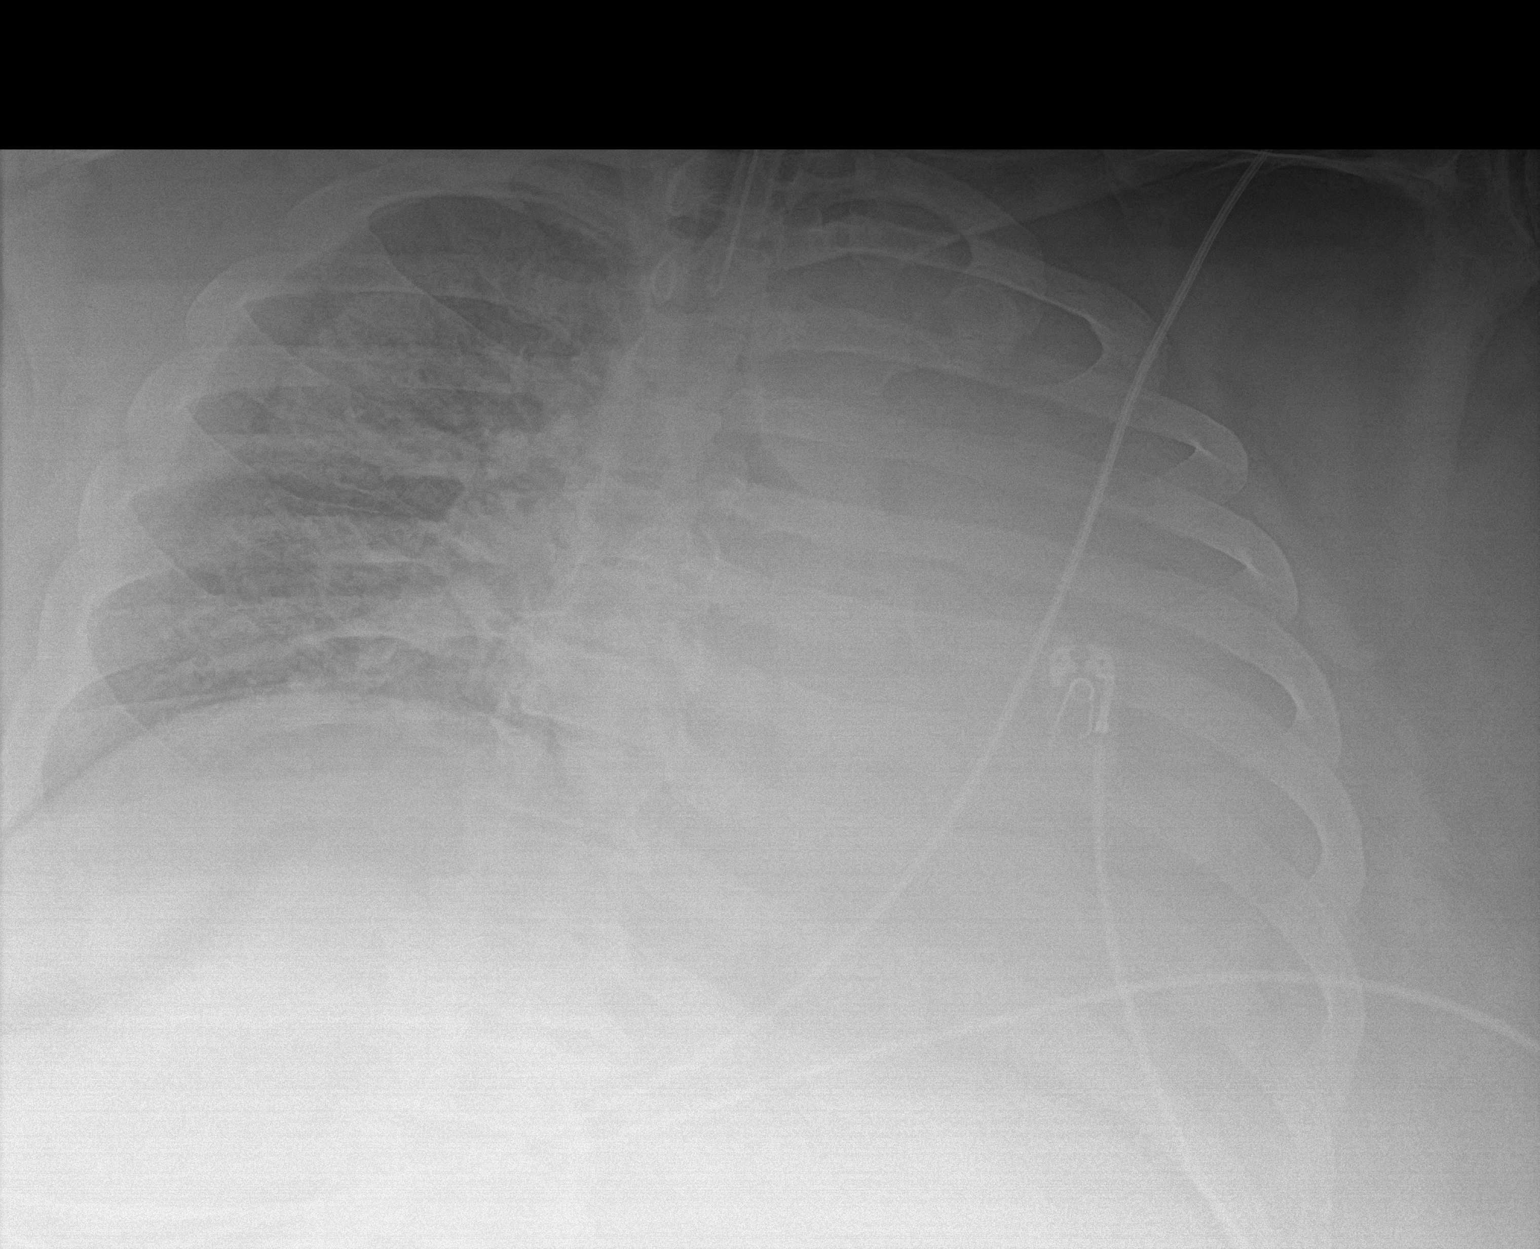

[1 of 1 positions shown; findings below may reference images not displayed]

FINDINGS: Patient is rotated.

Endotracheal tube is present and has been retracted. Right IJ
central line tip probably overlies SVC.

No pneumothorax. Low lung volumes. Bilateral opacities again
identified with increased total opacification of left thorax.
Cardiomediastinal contours are obscured.
IMPRESSION: Endotracheal tube has been retracted. Right IJ central line tip
probably overlies SVC. No pneumothorax.

Bilateral opacities with increased total opacification of left
thorax.

## 2023-07-03 IMAGING — DX DG CHEST 1V PORT
1 series · 1 of 1 positions shown · non-contrast
Comparison: 05/31/2021

CLINICAL DATA: Status post intubation.

EXAM:
PORTABLE CHEST 1 VIEW

[chest ap]
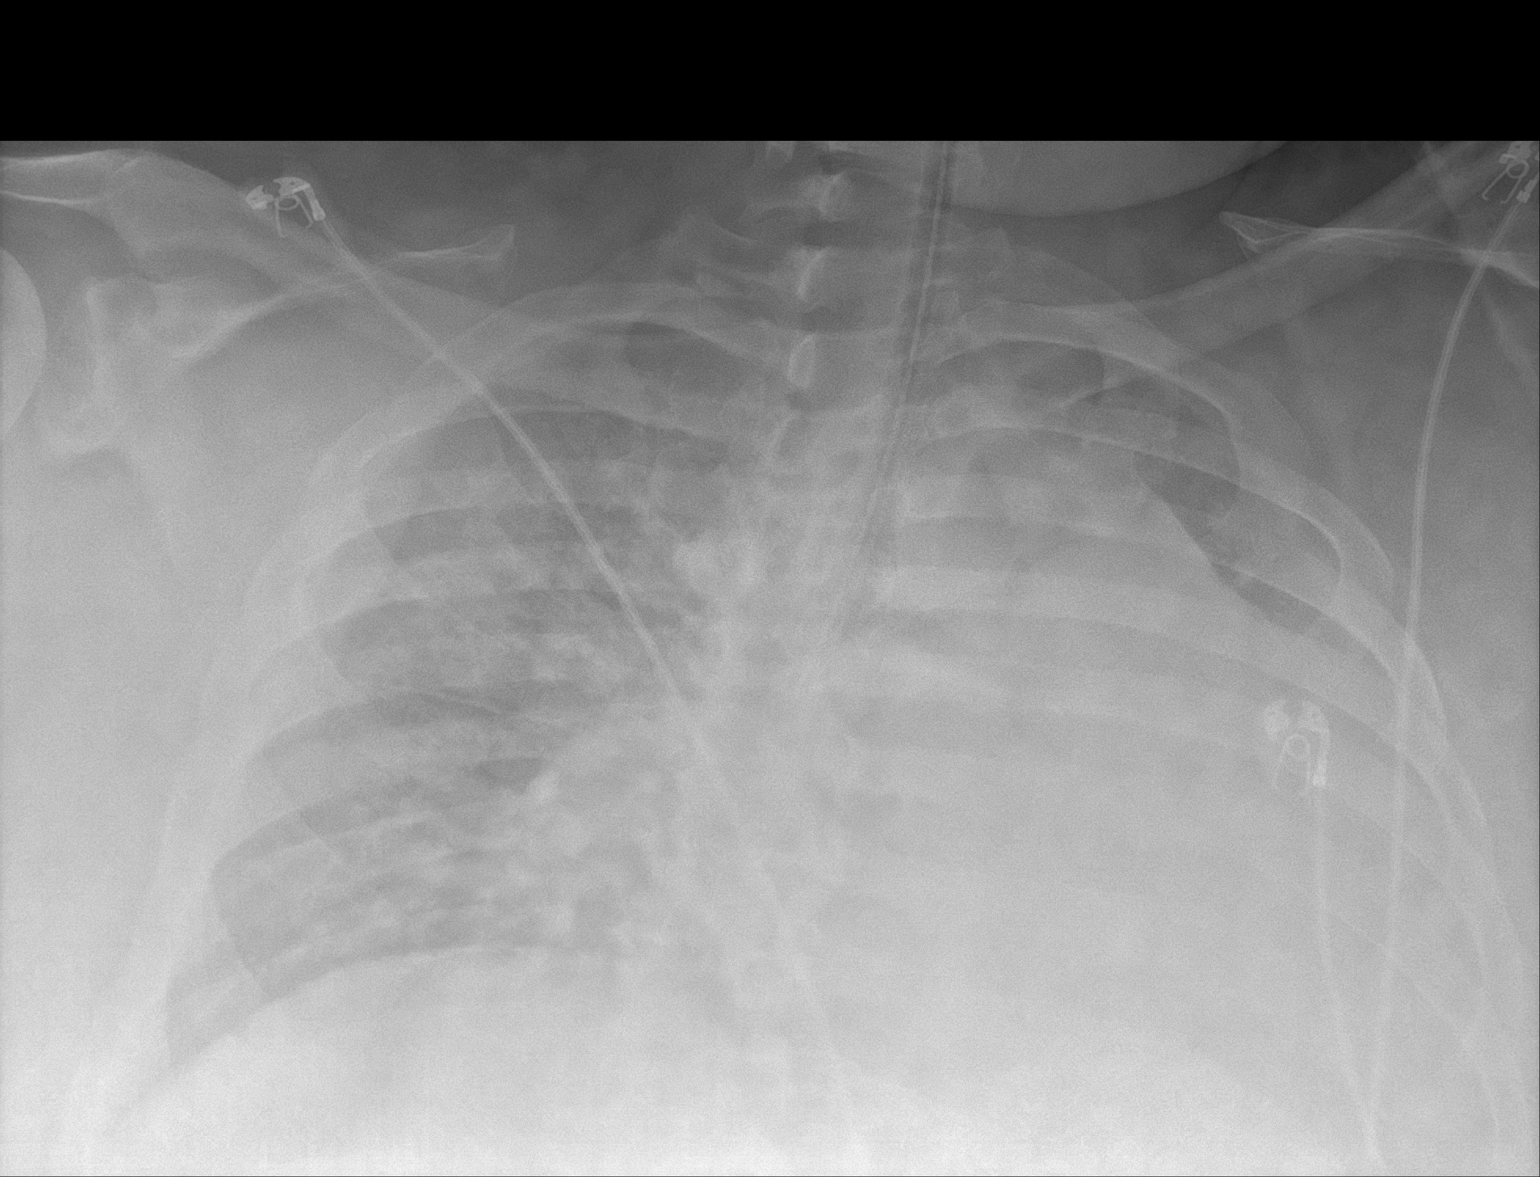

[1 of 1 positions shown; findings below may reference images not displayed]

FINDINGS: Low volume rotated film with underpenetration. The cardio
pericardial silhouette is enlarged. Probable diffuse bilateral
airspace disease. Endotracheal tube tip appears to be in the right
mainstem bronchus although airway anatomy is poorly demonstrated on
this film. Telemetry leads overlie the chest.
IMPRESSION: 1. Probable right mainstem intubation. Endotracheal tube could be
withdrawn 3-4 cm for tip placement above the carina.
2. I called this report to the patient's nurse, Fall, who was in
the room with the patient and directly verbalized these results to
the attending physician, Dr. Rabelo, who was in the room with her.

## 2023-07-04 IMAGING — DX DG CHEST 1V PORT
2 series · 2 of 2 positions shown · non-contrast
Comparison: 06/01/2021

CLINICAL DATA: ET tube, respiratory failure

EXAM:
PORTABLE CHEST 1 VIEW

[chest ap (1 of 2)]
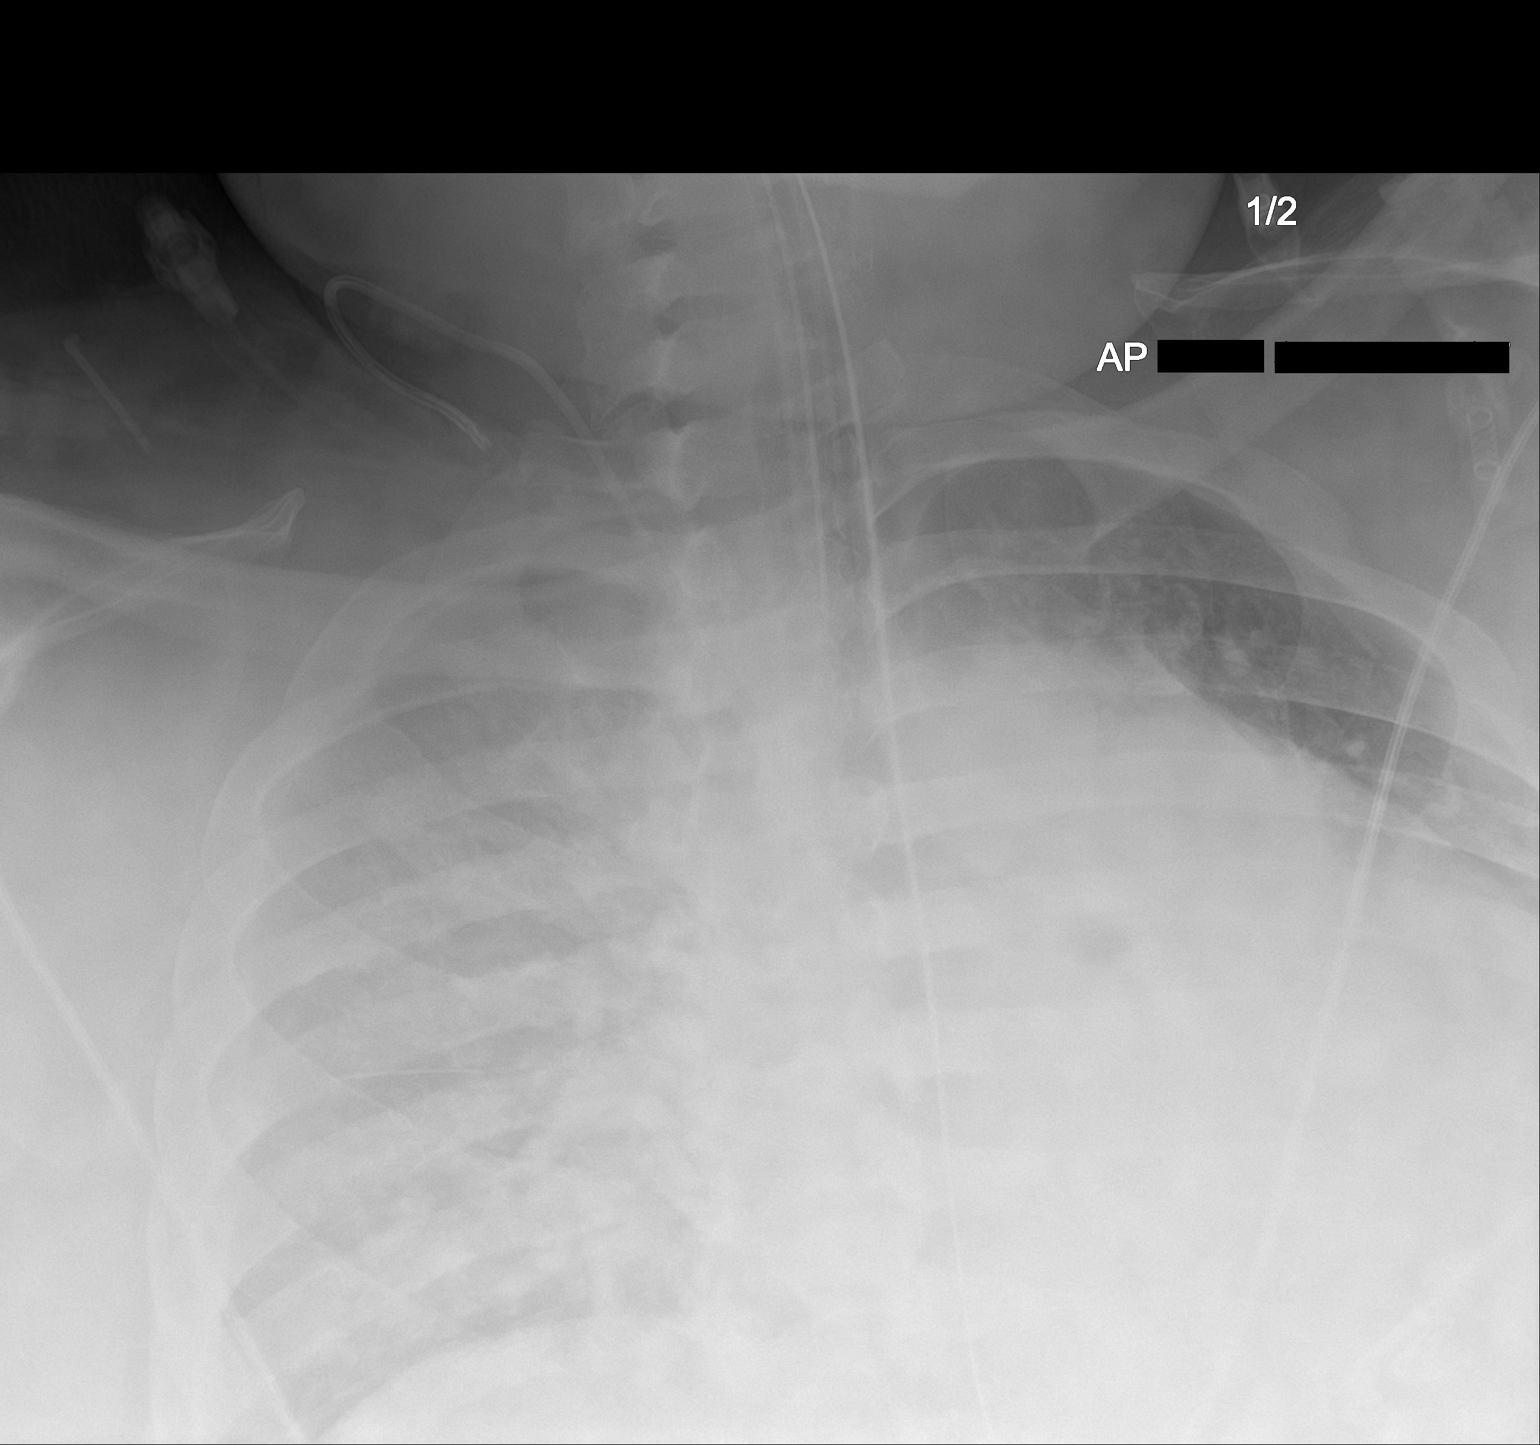

[chest ap (2 of 2)]
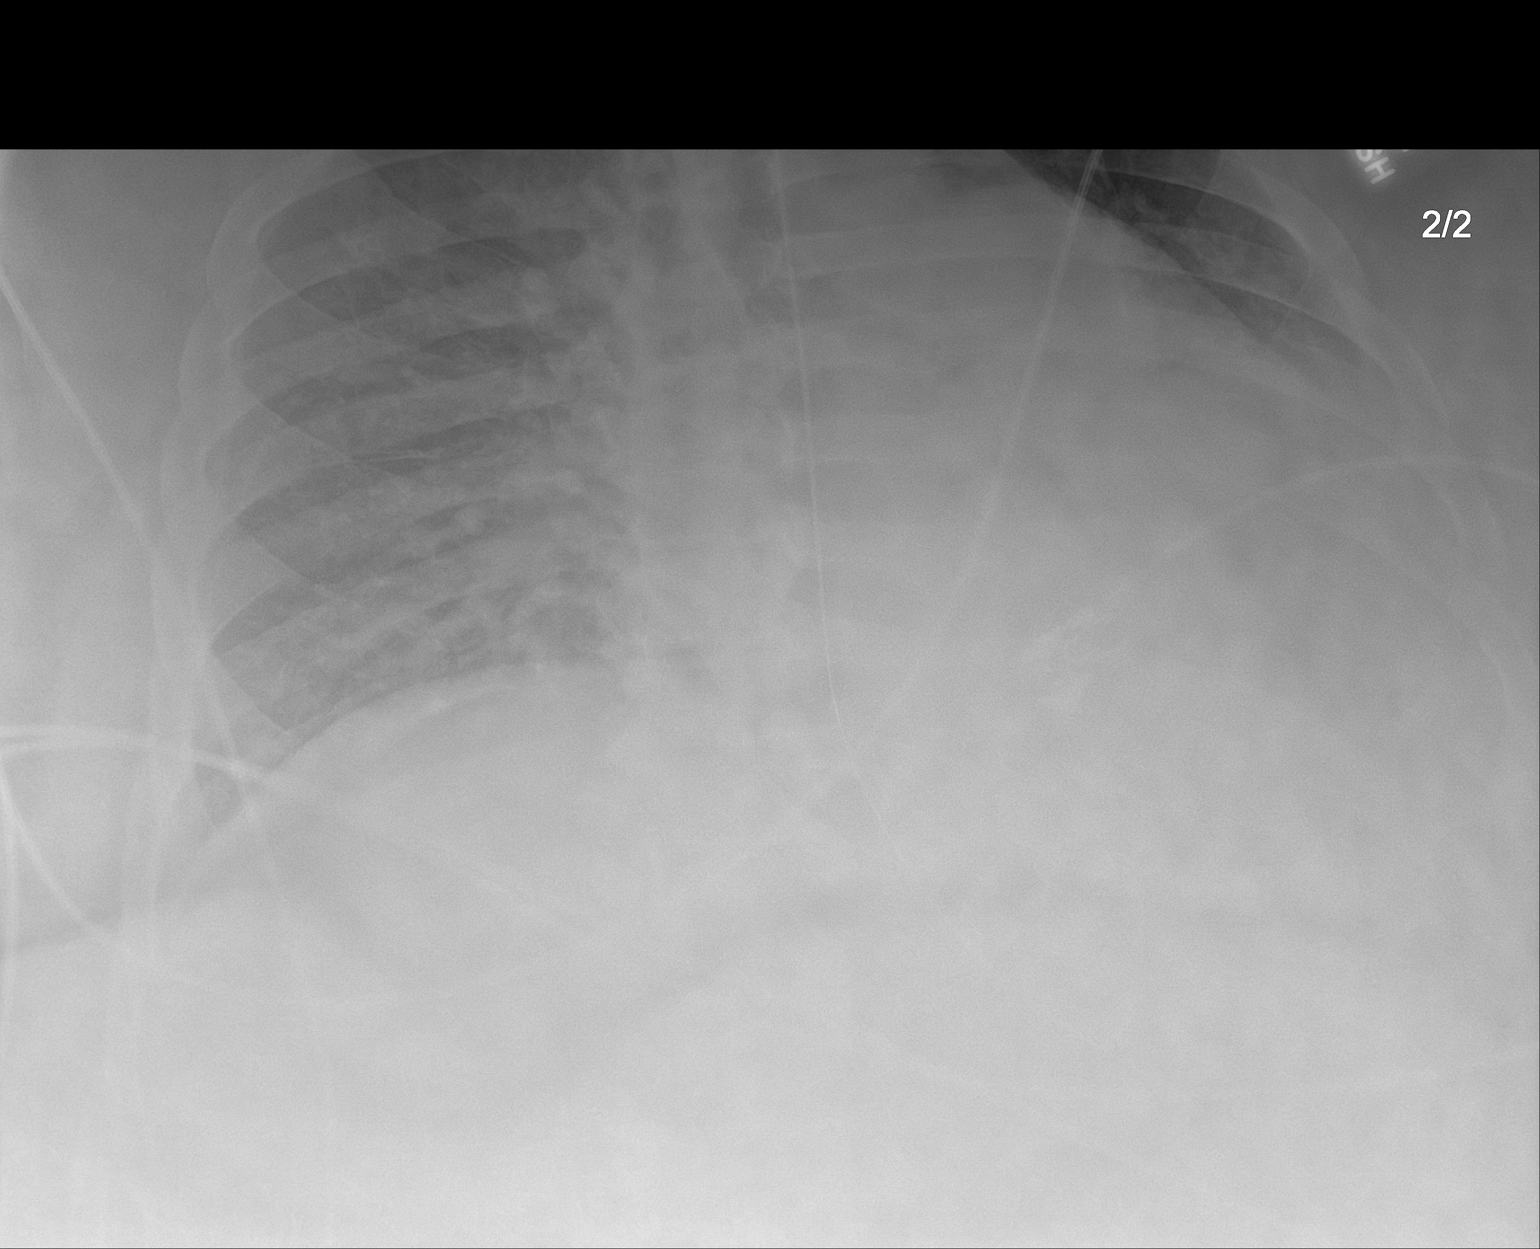

[2 of 2 positions shown; findings below may reference images not displayed]

FINDINGS: Cardiomegaly. Diffuse bilateral airspace disease, likely edema.
Endotracheal tube and NG tube are unchanged. Right central line
unchanged.
IMPRESSION: Diffuse bilateral airspace disease, likely edema.

Support devices stable.

## 2023-07-06 ENCOUNTER — Ambulatory Visit: Payer: Medicare HMO

## 2023-07-06 DIAGNOSIS — R2689 Other abnormalities of gait and mobility: Secondary | ICD-10-CM | POA: Diagnosis not present

## 2023-07-06 DIAGNOSIS — M25551 Pain in right hip: Secondary | ICD-10-CM

## 2023-07-06 DIAGNOSIS — M6281 Muscle weakness (generalized): Secondary | ICD-10-CM

## 2023-07-06 DIAGNOSIS — M25552 Pain in left hip: Secondary | ICD-10-CM | POA: Diagnosis not present

## 2023-07-06 NOTE — Therapy (Signed)
OUTPATIENT PHYSICAL THERAPY TREATMENT NOTE   Patient Name: Michael Payne MRN: [id number] DOB:02-Apr-1971, 52 y.o., male Today's Date: 07/06/2023  END OF SESSION:  PT End of Session - 07/06/23 1121     Visit Number 13    Date for PT Re-Evaluation 08/03/23    Authorization Type [employer] M[employer]    Authorization Time Period 6 visits 06/22/23-07/22/23    Authorization - Visit Number 2    Authorization - Number of Visits 6    PT Start Time 1130    PT Stop Time 1210    PT Time Calculation (min) 40 min    Activity Tolerance Patient tolerated treatment well;Patient limited by fatigue    Behavior During Therapy [facility] for tasks assessed/performed               Past Medical History:  Diagnosis Date   Anemia    borderline anemia, was instructed to eat more iron filled foods   Cardiac enlargement 06/03/2022   Noted on Xray   CHF (congestive heart failure) (HCC) 05/2021   GERD (gastroesophageal reflux disease)    lactose intolerance, patient denies symptoms of reflux   Hip pain    History of kidney stones    HTN (hypertension)    Morbid obesity (HCC)    Obesity    Pneumonia 05/2021   Pre-diabetes    Sleep apnea    Past Surgical History:  Procedure Laterality Date   FOOT SURGERY Right    KNEE ARTHROSCOPY WITH MEDIAL MENISECTOMY Left 12/16/2014   Procedure: KNEE ARTHROSCOPY WITH MEDIAL MENISECTOMY;  Surgeon: Sheral Apley, MD;  Location: [facility] OR;  Service: Orthopedics;  Laterality: Left;   UPPER GI ENDOSCOPY N/A 06/20/2022   Procedure: UPPER GI ENDOSCOPY;  Surgeon: Quentin Ore, MD;  Location: [facility];  Service: General;  Laterality: N/A;   Patient Active Problem List   Diagnosis Date Noted   Advance care planning 01/29/2023   Skin lesion 01/29/2023   Chronic pain 01/29/2023   Hyperglycemia 11/01/2022   Morbid obesity with BMI of 60.0-69.9, adult (HCC) 06/20/2022   Right shoulder pain 05/11/2022   Hordeolum externum of right eye 02/18/2022   Nonobstructive  transaminitis 06/28/2021   Decubitus ulcer of posterior thigh, stage 2 (HCC) 06/28/2021   Obesity, Class III, BMI 40-49.9 (morbid obesity) (HCC) 06/10/2021   Osteoarthritis 06/10/2021   Pressure injury of skin 06/09/2021   Respiratory failure (HCC) 06/01/2021   Acute diastolic CHF (congestive heart failure) (HCC) 05/31/2021   CHF (congestive heart failure) (HCC) 05/31/2021   Hypoxia 05/31/2021   Gout, unspecified 03/10/2021   Edema 03/10/2021   Muscle spasms of both lower extremities 07/29/2020   Bedbound 05/22/2020   Unilateral osteoarthritis of hip, right 12/27/2019   Severe obstructive sleep apnea 02/22/2018   Hypertriglyceridemia 08/24/2017   Low HDL (under 40) 08/24/2017   Metabolic syndrome 08/24/2017   Morbid obesity (HCC) 10/14/2013   HTN (hypertension) 10/14/2013    PCP: Joaquim Nam, MD  REFERRING PROVIDER: Joaquim Nam, MD  REFERRING DIAG: M16.0 (ICD-10-CM) - Osteoarthritis of both hips, unspecified osteoarthritis type  THERAPY DIAG:  Pain of both hip joints  Other abnormalities of gait and mobility  Muscle weakness (generalized)  Rationale for Evaluation and Treatment: Rehabilitation  ONSET DATE: chronic  SUBJECTIVE:   SUBJECTIVE STATEMENT:  Patient reports that his hip are bothering him more today, has noticed an increase in pain in the morning due to the cold weather.    PERTINENT HISTORY:  See PMH  PAIN:  Are you having pain?  Yes: NPRS scale: 6/10 Pain location: B hips Pain description: ache Aggravating factors: activity Relieving factors: medication  PRECAUTIONS: None  WEIGHT BEARING RESTRICTIONS: No  FALLS:  Has patient fallen in last 6 months? No  LIVING ENVIRONMENT: Lives with: lives with their daughter Lives in: House/apartment Stairs:  5 Has following equipment at home: Environmental consultant - 2 wheeled  OCCUPATION: not working  PLOF: Independent  PATIENT GOALS: To manage my hip pain  OBJECTIVE:   DIAGNOSTIC FINDINGS:  none  PATIENT SURVEYS:  FOTO: 43(60 predicted); 04/10/23 47  MUSCLE LENGTH: Hamstrings: Right 45 deg; Left 45 deg (measured in seated)  POSTURE:  unable to assess due to body habitus  PALPATION: Deferred due to body habitus  LOWER EXTREMITY ROM: Functional for gait, transfers and bed mobility   LOWER EXTREMITY MMT:  MMT Right eval Left eval Right 06/22/23 Left 06/22/23  Hip flexion 3+ 3+ 4 4  Hip extension 3+ 3+    Hip abduction 3+ 3+    Hip adduction      Hip internal rotation      Hip external rotation      Knee flexion 3+ 3+    Knee extension 3+ 3+ 4- 4-  Ankle dorsiflexion      Ankle plantarflexion 3+ 3+    Ankle inversion      Ankle eversion         LOWER EXTREMITY SPECIAL TESTS:  UTA due to body habitus  FUNCTIONAL TESTS:  30 seconds chair stand test 0 reps;  04/10/23 0 reps; 04/12/23 1 rep from high mat table 04/17/23 2 reps from 22 in height w/o UE support 05/10/23 5 reps from 22 in height  2 MWT 224ft with RW;  04/10/23 234ft  6 MWT 04/17/23 6 MWT 459ft with rollator;  05/10/23 6 MWT with rollator 595ft  GAIT: Distance walked: 27ftx2 Assistive device utilized: Environmental consultant - 2 wheeled Level of assistance: Complete Independence Comments: slow cadence, flexed posture   TODAY'S TREATMENT:      [facility] Adult PT Treatment:                                                DATE: 07/06/23 Therapeutic Exercise: Standing hamstring curl 2x10 BIL Standing hip abduction 2x10 BIL Standing hip extension 2x10 BIL Standing heel raises 2x10 Taps to 4" step 2x10 BIL Seated LAQ 4# with hold at top 2x10 BIL Seated marching 4# 3x30" Seated hamstring curl BlackTB doubled 2x10 BIL Gait rolled into therex with SPC x40 ft - cues for sequence      [facility] Adult PT Treatment:                                                DATE: 06/28/23 Therapeutic Exercise: Standing hamstring curl 2x10 BIL Standing hip abduction 2x10 BIL Standing hip extension 2x10 BIL Standing heel raises  2x10 Seated LAQ 4# with hold at top 2x10 BIL Seated marching 4# 3x30" Seated hamstring curl BlackTB doubled 2x10 BIL Gait rolled into therex with SPC x40 ft - cues for sequence    [facility] Adult PT Treatment:  DATE: 06/22/2023  Therapeutic Exercise: 2 x 15 p-ring hip adduction  2 x 15 supine single leg clamshell  2 x 5 sidelying reverse clamshells 15 glute bridges   Therapeutic Activity:  Reassessment of objective measures and subjective assessment regarding progress towards established goals and plan for updated POC, including updated HEP to address ongoing functional deficits as noted in assessment.                                                                                                                   PATIENT EDUCATION:  Education details: Discussed eval findings, rehab rationale and POC and patient is in agreement  Person educated: Patient Education method: Explanation Education comprehension: verbalized understanding and needs further education  HOME EXERCISE PROGRAM: Access Code: [email or web address] URL: [email or web address] Date: 06/22/2023 Prepared by: Mauri Reading  Exercises - Seated Hamstring Stretch  - 2 x daily - 5 x weekly - 2 reps - 30s hold - Sidelying Reverse Clamshell  - 1 x daily - 7 x weekly - 2 sets - 10 reps - Clamshell  - 1 x daily - 7 x weekly - 2 sets - 10 reps - Supine Bridge with Mini Swiss Ball Between Knees  - 1 x daily - 7 x weekly - 2 sets - 10 reps - Sitting Knee Extension with Resistance  - 1 x daily - 7 x weekly - 2 sets - 10 reps  ASSESSMENT:  CLINICAL IMPRESSION:  Patient presents to PT reporting increased pain in BIL hips today. He has been using the cane at home occasionally. Session today continued to focus on LE strengthening and improving standing activity tolerance. Patient was able to tolerate all prescribed exercises with no adverse effects. Patient continues to benefit from  skilled PT services and should be progressed as able to improve functional independence.   OBJECTIVE IMPAIRMENTS: Abnormal gait, decreased activity tolerance, decreased endurance, decreased mobility, difficulty walking, decreased ROM, decreased strength, improper body mechanics, obesity, and pain.   ACTIVITY LIMITATIONS: bending, sitting, standing, squatting, stairs, transfers, bed mobility, and locomotion level  PERSONAL FACTORS: Age, Fitness, Past/current experiences, Time since onset of injury/illness/exacerbation, and 1 comorbidity: obesity  are also affecting patient's functional outcome.   REHAB POTENTIAL: Fair based on body habitus, chronicity and underlying degenerative changes  CLINICAL DECISION MAKING: Stable/uncomplicated  EVALUATION COMPLEXITY: Low   GOALS: Goals reviewed with patient? No  LONG TERM GOALS: Target date: 08/03/2023, last updated 06/22/23   Patient to demonstrate independence in HEP  Baseline: [id number] Goal status: MET  2.  Increase 30s chair stand test to 1 rep Baseline: 0 reps;  04/10/23 0 reps;  04/12/23 1 reps after several attempts;  04/17/23 2 reps from 22 in height; 05/10/23 5 reps from 22 in height Goal status: DISCONTINUE  3.  Increase distance on 2 MWT to 244ft+ Baseline: 273ft with RW; 262ft w/rollator Goal status: MET  4.  Increase BLE strength to 4-/5 Baseline:  MMT Right eval Left eval  Hip flexion 3+ 3+  Hip extension 3+  3+  Hip abduction 3+ 3+  Hip adduction    Hip internal rotation    Hip external rotation    Knee flexion 3+ 3+  Knee extension 3+ 3+  Ankle dorsiflexion    Ankle plantarflexion 3+ 3+  Ankle inversion    Ankle eversion     Goal status: DISCONTINUE  Added 04/17/23  5. Increase FOTO Score to 60   Baseline: 43(60 predicted);  04/10/23 47 06/22/23: 47  Goal Status: ONGOING  6. Increase distance on 6 MWT to 515ft with RW --- UPDATED GOALS: 600 ft on 05/10/23   Baseline 479ft with RW;  05/10/23 505 ft  Goal  Status: PROGRESSING  Added 06/22/23:   7.  Increase 30s chair stand test to 5 or more repetitions with seat height no greater than 17-20"  Baseline: 5 reps from 22 in height Goal status: PROGRESSING  8. Increase BLE strength to 4/5 Baseline:  MMT Right eval Left eval Right 06/22/23 Left 06/22/23  Hip flexion 3+ 3+ 4 4  Hip extension 3+ 3+    Hip abduction 3+ 3+    Hip adduction      Hip internal rotation      Hip external rotation      Knee flexion 3+ 3+    Knee extension 3+ 3+ 4- 4-  Ankle dorsiflexion      Ankle plantarflexion 3+ 3+    Ankle inversion      Ankle eversion        Goal Status: PROGRESSING    PLAN:  PT FREQUENCY: 1x/week  PT DURATION: 6 weeks, last updated 06/22/23  PLANNED INTERVENTIONS: Therapeutic exercises, Therapeutic activity, Neuromuscular re-education, Balance training, Gait training, Patient/Family education, Self Care, Joint mobilization, Stair training, DME instructions, Aquatic Therapy, Electrical stimulation, Cryotherapy, Moist heat, Manual therapy, and Re-evaluation  PLAN FOR NEXT SESSION: HEP review and update, manual techniques as appropriate, aerobic activity to address activity tolerance/endurance deficits, LE strengthening in all directions, hip adduction/flexion mobility activities, increased standing tolerance progression.    Berta Minor PTA 07/06/2023, 12:11 PM   Referring diagnosis? B hip pain Treatment diagnosis? (if different than referring diagnosis) B OA of hips What was this (referring dx) caused by? []  Surgery []  Fall []  Ongoing issue [x]  Arthritis []  Other: ____________  Laterality: []  Rt []  Lt [x]  Both  Check all possible CPT codes:  *CHOOSE 10 OR LESS*    [x]  97110 (Therapeutic Exercise)  []  92507 (SLP Treatment)  [x]  97112 (Neuro Re-ed)   []  52841 (Swallowing Treatment)   [x]  97116 (Gait Training)   []  [id number] (Cognitive Training, 1st 15 minutes) [x]  97140 (Manual Therapy)   []  97130 (Cognitive Training,  each add'l 15 minutes)  [x]  97164 (Re-evaluation)                              []  Other, List CPT Code ____________  [x]  97530 (Therapeutic Activities)     [x]  97535 (Self Care)   [x]  All codes above (97110 - 97535)  []  97012 (Mechanical Traction)  []  97014 (E-stim Unattended)  []  97032 (E-stim manual)  []  97033 (Ionto)  []  97035 (Ultrasound) []  97750 (Physical Performance Training) []  [id number] (Aquatic Therapy) []  97016 (Vasopneumatic Device) []  [id number] (Paraffin) []  97034 (Contrast Bath) []  97597 (Wound Care 1st 20 sq cm) []  97598 (Wound Care each add'l 20 sq cm) []  97760 (Orthotic Fabrication, Fitting, Training Initial) []  [id number] (Prosthetic Management and Training Initial) []  [id number] (Orthotic or  Prosthetic Training/ Modification Subsequent)

## 2023-07-12 ENCOUNTER — Other Ambulatory Visit: Payer: Self-pay | Admitting: Family Medicine

## 2023-07-12 ENCOUNTER — Ambulatory Visit: Payer: Medicare HMO

## 2023-07-12 DIAGNOSIS — M6281 Muscle weakness (generalized): Secondary | ICD-10-CM | POA: Diagnosis not present

## 2023-07-12 DIAGNOSIS — M25551 Pain in right hip: Secondary | ICD-10-CM

## 2023-07-12 DIAGNOSIS — R2689 Other abnormalities of gait and mobility: Secondary | ICD-10-CM

## 2023-07-12 DIAGNOSIS — M25552 Pain in left hip: Secondary | ICD-10-CM | POA: Diagnosis not present

## 2023-07-12 NOTE — Therapy (Signed)
OUTPATIENT PHYSICAL THERAPY TREATMENT NOTE   Patient Name: Michael Payne MRN: [id number] DOB:[birth year], 52 y.o., male Today's Date: 07/12/2023  END OF SESSION:  PT End of Session - 07/12/23 1138     Visit Number 14    Date for PT Re-Evaluation 08/03/23    Authorization Type [employer] M[employer]    Authorization Time Period 6 visits 06/22/23-07/22/23    Authorization - Visit Number 3    Authorization - Number of Visits 6    PT Start Time 1130    PT Stop Time 1208    PT Time Calculation (min) 38 min    Activity Tolerance Patient tolerated treatment well    Behavior During Therapy WFL for tasks assessed/performed                Past Medical History:  Diagnosis Date   Anemia    borderline anemia, was instructed to eat more iron filled foods   Cardiac enlargement 06/03/2022   Noted on Xray   CHF (congestive heart failure) (HCC) 05/2021   GERD (gastroesophageal reflux disease)    lactose intolerance, patient denies symptoms of reflux   Hip pain    History of kidney stones    HTN (hypertension)    Morbid obesity (HCC)    Obesity    Pneumonia 05/2021   Pre-diabetes    Sleep apnea    Past Surgical History:  Procedure Laterality Date   FOOT SURGERY Right    KNEE ARTHROSCOPY WITH MEDIAL MENISECTOMY Left 12/16/2014   Procedure: KNEE ARTHROSCOPY WITH MEDIAL MENISECTOMY;  Surgeon: Sheral Apley, MD;  Location: [facility];  Service: Orthopedics;  Laterality: Left;   UPPER GI ENDOSCOPY N/A 06/20/2022   Procedure: UPPER GI ENDOSCOPY;  Surgeon: Quentin Ore, MD;  Location: [facility];  Service: General;  Laterality: N/A;   Patient Active Problem List   Diagnosis Date Noted   Advance care planning 01/29/2023   Skin lesion 01/29/2023   Chronic pain 01/29/2023   Hyperglycemia 11/01/2022   Morbid obesity with BMI of 60.0-69.9, adult (HCC) 06/20/2022   Right shoulder pain 05/11/2022   Hordeolum externum of right eye 02/18/2022   Nonobstructive transaminitis 06/28/2021    Decubitus ulcer of posterior thigh, stage 2 (HCC) 06/28/2021   Obesity, Class III, BMI 40-49.9 (morbid obesity) (HCC) 06/10/2021   Osteoarthritis 06/10/2021   Pressure injury of skin 06/09/2021   Respiratory failure (HCC) 06/01/2021   Acute diastolic CHF (congestive heart failure) (HCC) 05/31/2021   CHF (congestive heart failure) (HCC) 05/31/2021   Hypoxia 05/31/2021   Gout, unspecified 03/10/2021   Edema 03/10/2021   Muscle spasms of both lower extremities 07/29/2020   Bedbound 05/22/2020   Unilateral osteoarthritis of hip, right 12/27/2019   Severe obstructive sleep apnea 02/22/2018   Hypertriglyceridemia 08/24/2017   Low HDL (under 40) 08/24/2017   Metabolic syndrome 08/24/2017   Morbid obesity (HCC) 10/14/2013   HTN (hypertension) 10/14/2013    PCP: Joaquim Nam, MD  REFERRING PROVIDER: Joaquim Nam, MD  REFERRING DIAG: M16.0 (ICD-10-CM) - Osteoarthritis of both hips, unspecified osteoarthritis type  THERAPY DIAG:  Pain of both hip joints  Other abnormalities of gait and mobility  Muscle weakness (generalized)  Rationale for Evaluation and Treatment: Rehabilitation  ONSET DATE: chronic  SUBJECTIVE:   SUBJECTIVE STATEMENT:  Patient reporting that he feels his recent PT sessions have been reasonably challenging. He is able to do short bouts of walking with SPC at home.    PERTINENT HISTORY:  See PMH  PAIN:  Are you having pain?  Yes: NPRS scale: 6/10 Pain location: B hips Pain description: ache Aggravating factors: activity Relieving factors: medication  PRECAUTIONS: None  WEIGHT BEARING RESTRICTIONS: No  FALLS:  Has patient fallen in last 6 months? No  LIVING ENVIRONMENT: Lives with: lives with their daughter Lives in: House/apartment Stairs:  5 Has following equipment at home: Environmental consultant - 2 wheeled  OCCUPATION: not working  PLOF: Independent  PATIENT GOALS: To manage my hip pain  OBJECTIVE:   DIAGNOSTIC FINDINGS: none  PATIENT  SURVEYS:  FOTO: 43(60 predicted); 04/10/23 47  MUSCLE LENGTH: Hamstrings: Right 45 deg; Left 45 deg (measured in seated)  POSTURE:  unable to assess due to body habitus  PALPATION: Deferred due to body habitus  LOWER EXTREMITY ROM: Functional for gait, transfers and bed mobility   LOWER EXTREMITY MMT:  MMT Right eval Left eval Right 06/22/23 Left 06/22/23  Hip flexion 3+ 3+ 4 4  Hip extension 3+ 3+    Hip abduction 3+ 3+    Hip adduction      Hip internal rotation      Hip external rotation      Knee flexion 3+ 3+    Knee extension 3+ 3+ 4- 4-  Ankle dorsiflexion      Ankle plantarflexion 3+ 3+    Ankle inversion      Ankle eversion         LOWER EXTREMITY SPECIAL TESTS:  UTA due to body habitus  FUNCTIONAL TESTS:  30 seconds chair stand test 0 reps;  04/10/23 0 reps; 04/12/23 1 rep from high mat table 04/17/23 2 reps from 22 in height w/o UE support 05/10/23 5 reps from 22 in height  2 MWT 224ft with RW;  04/10/23 245ft  6 MWT 04/17/23 6 MWT 422ft with rollator;  05/10/23 6 MWT with rollator 561ft  GAIT: Distance walked: 49ftx2 Assistive device utilized: Environmental consultant - 2 wheeled Level of assistance: Complete Independence Comments: slow cadence, flexed posture   TODAY'S TREATMENT:       [facility] Adult PT Treatment:                                                DATE: 07/12/2023  Therapeutic Exercise: Standing hamstring curl 2x10 BIL ankle weight 2.5# Standing hip abduction 2x10 BIL ankle weight 2.5# Standing hip extension 2x10 BIL ankle weight 2.5# Standing heel raises 2x10 ankle weight 2.5# Taps to 4" step 2x10 BIL ankle weight 2.5# Seated LAQ 4# with hold at top 2x10 BIL Seated marching 4# 3x30" Gait rolled into therex with SPC x 100 ft - cues for sequence  Sit to Stand from low chair intermittent (after seated rest break and seated exercises) Heel toe raises on airex x 20  Standing marches on airex, 2 x 30 sec    [facility] Adult PT Treatment:                                                 DATE: 07/06/23 Therapeutic Exercise: Standing hamstring curl 2x10 BIL Standing hip abduction 2x10 BIL Standing hip extension 2x10 BIL Standing heel raises 2x10 Taps to 4" step 2x10 BIL Seated LAQ 4# with hold at top 2x10 BIL Seated marching 4#  3x30" Seated hamstring curl BlackTB doubled 2x10 BIL Gait rolled into therex with SPC x40 ft - cues for sequence      [facility] Adult PT Treatment:                                                DATE: 06/28/23 Therapeutic Exercise: Standing hamstring curl 2x10 BIL Standing hip abduction 2x10 BIL Standing hip extension 2x10 BIL Standing heel raises 2x10 Seated LAQ 4# with hold at top 2x10 BIL Seated marching 4# 3x30" Seated hamstring curl BlackTB doubled 2x10 BIL Gait rolled into therex with SPC x40 ft - cues for sequence    [facility] Adult PT Treatment:                                                DATE: 06/22/2023  Therapeutic Exercise: 2 x 15 p-ring hip adduction  2 x 15 supine single leg clamshell  2 x 5 sidelying reverse clamshells 15 glute bridges   Therapeutic Activity:  Reassessment of objective measures and subjective assessment regarding progress towards established goals and plan for updated POC, including updated HEP to address ongoing functional deficits as noted in assessment.                                                                                                                   PATIENT EDUCATION:  Education details: Discussed eval findings, rehab rationale and POC and patient is in agreement  Person educated: Patient Education method: Explanation Education comprehension: verbalized understanding and needs further education  HOME EXERCISE PROGRAM: Access Code: [email or web address] URL: [email or web address] Date: 06/22/2023 Prepared by: Mauri Reading  Exercises - Seated Hamstring Stretch  - 2 x daily - 5 x weekly - 2 reps - 30s hold - Sidelying Reverse Clamshell  - 1 x daily - 7 x  weekly - 2 sets - 10 reps - Clamshell  - 1 x daily - 7 x weekly - 2 sets - 10 reps - Supine Bridge with Mini Swiss Ball Between Knees  - 1 x daily - 7 x weekly - 2 sets - 10 reps - Sitting Knee Extension with Resistance  - 1 x daily - 7 x weekly - 2 sets - 10 reps  ASSESSMENT:  CLINICAL IMPRESSION:  Jamarreon continues to demonstrate improved activity tolerance, including standing hip strengthening. He continues to have difficulty performing sit to stand from low chair height, but was able to perform at least 4 times today using arm rests. We will continue to progress as appropriate with strengthening and functional strengthening program.     OBJECTIVE IMPAIRMENTS: Abnormal gait, decreased activity tolerance, decreased endurance, decreased mobility, difficulty walking, decreased ROM, decreased strength, improper body mechanics, obesity, and pain.   ACTIVITY LIMITATIONS: bending,  sitting, standing, squatting, stairs, transfers, bed mobility, and locomotion level  PERSONAL FACTORS: Age, Fitness, Past/current experiences, Time since onset of injury/illness/exacerbation, and 1 comorbidity: obesity  are also affecting patient's functional outcome.   REHAB POTENTIAL: Fair based on body habitus, chronicity and underlying degenerative changes  CLINICAL DECISION MAKING: Stable/uncomplicated  EVALUATION COMPLEXITY: Low   GOALS: Goals reviewed with patient? No  LONG TERM GOALS: Target date: 08/03/2023, last updated 06/22/23   Patient to demonstrate independence in HEP  Baseline: [id number] Goal status: MET  2.  Increase 30s chair stand test to 1 rep Baseline: 0 reps;  04/10/23 0 reps;  04/12/23 1 reps after several attempts;  04/17/23 2 reps from 22 in height; 05/10/23 5 reps from 22 in height Goal status: DISCONTINUE  3.  Increase distance on 2 MWT to 274ft+ Baseline: 213ft with RW; 228ft w/rollator Goal status: MET  4.  Increase BLE strength to 4-/5 Baseline:  MMT Right eval Left eval   Hip flexion 3+ 3+  Hip extension 3+ 3+  Hip abduction 3+ 3+  Hip adduction    Hip internal rotation    Hip external rotation    Knee flexion 3+ 3+  Knee extension 3+ 3+  Ankle dorsiflexion    Ankle plantarflexion 3+ 3+  Ankle inversion    Ankle eversion     Goal status: DISCONTINUE  Added 04/17/23  5. Increase FOTO Score to 60   Baseline: 43(60 predicted);  04/10/23 47 06/22/23: 47  Goal Status: ONGOING  6. Increase distance on 6 MWT to 545ft with RW --- UPDATED GOALS: 600 ft on 05/10/23   Baseline 414ft with RW;  05/10/23 505 ft  Goal Status: PROGRESSING  Added 06/22/23:   7.  Increase 30s chair stand test to 5 or more repetitions with seat height no greater than 17-20"  Baseline: 5 reps from 22 in height Goal status: PROGRESSING  8. Increase BLE strength to 4/5 Baseline:  MMT Right eval Left eval Right 06/22/23 Left 06/22/23  Hip flexion 3+ 3+ 4 4  Hip extension 3+ 3+    Hip abduction 3+ 3+    Hip adduction      Hip internal rotation      Hip external rotation      Knee flexion 3+ 3+    Knee extension 3+ 3+ 4- 4-  Ankle dorsiflexion      Ankle plantarflexion 3+ 3+    Ankle inversion      Ankle eversion        Goal Status: PROGRESSING    PLAN:  PT FREQUENCY: 1x/week  PT DURATION: 6 weeks, last updated 06/22/23  PLANNED INTERVENTIONS: Therapeutic exercises, Therapeutic activity, Neuromuscular re-education, Balance training, Gait training, Patient/Family education, Self Care, Joint mobilization, Stair training, DME instructions, Aquatic Therapy, Electrical stimulation, Cryotherapy, Moist heat, Manual therapy, and Re-evaluation  PLAN FOR NEXT SESSION: HEP review and update, manual techniques as appropriate, aerobic activity to address activity tolerance/endurance deficits, LE strengthening in all directions, hip adduction/flexion mobility activities, increased standing tolerance progression.    Mauri Reading, PT, DPT    07/12/2023, 1:11 PM   Referring  diagnosis? B hip pain Treatment diagnosis? (if different than referring diagnosis) B OA of hips What was this (referring dx) caused by? []  Surgery []  Fall []  Ongoing issue [x]  Arthritis []  Other: ____________  Laterality: []  Rt []  Lt [x]  Both  Check all possible CPT codes:  *CHOOSE 10 OR LESS*    [x]  97110 (Therapeutic Exercise)  []  92507 (SLP Treatment)  [  x] [phone] (Neuro Re-ed)   []  92526 (Swallowing Treatment)   [x]  [phone] (Gait Training)   []  57846 (Cognitive Training, 1st 15 minutes) [x]  97140 (Manual Therapy)   []  97130 (Cognitive Training, each add'l 15 minutes)  [x]  97164 (Re-evaluation)                              []  Other, List CPT Code ____________  [x]  97530 (Therapeutic Activities)     [x]  97535 (Self Care)   [x]  All codes above (97110 - 97535)  []  97012 (Mechanical Traction)  []  97014 (E-stim Unattended)  []  97032 (E-stim manual)  []  97033 (Ionto)  []  97035 (Ultrasound) []  97750 (Physical Performance Training) []  [id number] (Aquatic Therapy) []  97016 (Vasopneumatic Device) []  [id number] (Paraffin) []  97034 (Contrast Bath) []  97597 (Wound Care 1st 20 sq cm) []  97598 (Wound Care each add'l 20 sq cm) []  97760 (Orthotic Fabrication, Fitting, Training Initial) []  [id number] (Prosthetic Management and Training Initial) []  [id number] (Orthotic or Prosthetic Training/ Modification Subsequent)

## 2023-07-18 NOTE — Therapy (Signed)
OUTPATIENT PHYSICAL THERAPY TREATMENT NOTE   Patient Name: Michael Payne MRN: [id number] DOB:05/06/1971, 52 y.o., male Today's Date: 07/19/2023  END OF SESSION:  PT End of Session - 07/19/23 1129     Visit Number 15    Authorization Type [employer] M[employer]    Authorization Time Period 6 visits 06/22/23-07/22/23    Authorization - Visit Number 4    Authorization - Number of Visits 6    PT Start Time 1130    PT Stop Time 1210    PT Time Calculation (min) 40 min    Activity Tolerance Patient tolerated treatment well    Behavior During Therapy [facility] for tasks assessed/performed                 Past Medical History:  Diagnosis Date   Anemia    borderline anemia, was instructed to eat more iron filled foods   Cardiac enlargement 06/03/2022   Noted on Xray   CHF (congestive heart failure) (HCC) 05/2021   GERD (gastroesophageal reflux disease)    lactose intolerance, patient denies symptoms of reflux   Hip pain    History of kidney stones    HTN (hypertension)    Morbid obesity (HCC)    Obesity    Pneumonia 05/2021   Pre-diabetes    Sleep apnea    Past Surgical History:  Procedure Laterality Date   FOOT SURGERY Right    KNEE ARTHROSCOPY WITH MEDIAL MENISECTOMY Left 12/16/2014   Procedure: KNEE ARTHROSCOPY WITH MEDIAL MENISECTOMY;  Surgeon: Sheral Apley, MD;  Location: [facility] OR;  Service: Orthopedics;  Laterality: Left;   UPPER GI ENDOSCOPY N/A 06/20/2022   Procedure: UPPER GI ENDOSCOPY;  Surgeon: Quentin Ore, MD;  Location: [facility];  Service: General;  Laterality: N/A;   Patient Active Problem List   Diagnosis Date Noted   Advance care planning 01/29/2023   Skin lesion 01/29/2023   Chronic pain 01/29/2023   Hyperglycemia 11/01/2022   Morbid obesity with BMI of 60.0-69.9, adult (HCC) 06/20/2022   Right shoulder pain 05/11/2022   Hordeolum externum of right eye 02/18/2022   Nonobstructive transaminitis 06/28/2021   Decubitus ulcer of posterior thigh, stage 2  (HCC) 06/28/2021   Obesity, Class III, BMI 40-49.9 (morbid obesity) (HCC) 06/10/2021   Osteoarthritis 06/10/2021   Pressure injury of skin 06/09/2021   Respiratory failure (HCC) 06/01/2021   Acute diastolic CHF (congestive heart failure) (HCC) 05/31/2021   CHF (congestive heart failure) (HCC) 05/31/2021   Hypoxia 05/31/2021   Gout, unspecified 03/10/2021   Edema 03/10/2021   Muscle spasms of both lower extremities 07/29/2020   Bedbound 05/22/2020   Unilateral osteoarthritis of hip, right 12/27/2019   Severe obstructive sleep apnea 02/22/2018   Hypertriglyceridemia 08/24/2017   Low HDL (under 40) 08/24/2017   Metabolic syndrome 08/24/2017   Morbid obesity (HCC) 10/14/2013   HTN (hypertension) 10/14/2013    PCP: Joaquim Nam, MD  REFERRING PROVIDER: Joaquim Nam, MD  REFERRING DIAG: M16.0 (ICD-10-CM) - Osteoarthritis of both hips, unspecified osteoarthritis type  THERAPY DIAG:  Pain of both hip joints  Other abnormalities of gait and mobility  Muscle weakness (generalized)  Rationale for Evaluation and Treatment: Rehabilitation  ONSET DATE: chronic  SUBJECTIVE:   SUBJECTIVE STATEMENT:    Patient reports that his pain as been 4-7/10 over the weekend.    PERTINENT HISTORY:  See PMH  PAIN:  Are you having pain?  Yes: NPRS scale: 6/10 Pain location: B hips Pain description: ache Aggravating factors:  activity Relieving factors: medication  PRECAUTIONS: None  WEIGHT BEARING RESTRICTIONS: No  FALLS:  Has patient fallen in last 6 months? No  LIVING ENVIRONMENT: Lives with: lives with their daughter Lives in: House/apartment Stairs:  5 Has following equipment at home: Environmental consultant - 2 wheeled  OCCUPATION: not working  PLOF: Independent  PATIENT GOALS: To manage my hip pain  OBJECTIVE:   DIAGNOSTIC FINDINGS: none  PATIENT SURVEYS:  FOTO: 43(60 predicted); 04/10/23 47  MUSCLE LENGTH: Hamstrings: Right 45 deg; Left 45 deg (measured in  seated)  POSTURE:  unable to assess due to body habitus  PALPATION: Deferred due to body habitus  LOWER EXTREMITY ROM: Functional for gait, transfers and bed mobility   LOWER EXTREMITY MMT:  MMT Right eval Left eval Right 06/22/23 Left 06/22/23  Hip flexion 3+ 3+ 4 4  Hip extension 3+ 3+    Hip abduction 3+ 3+    Hip adduction      Hip internal rotation      Hip external rotation      Knee flexion 3+ 3+    Knee extension 3+ 3+ 4- 4-  Ankle dorsiflexion      Ankle plantarflexion 3+ 3+    Ankle inversion      Ankle eversion         LOWER EXTREMITY SPECIAL TESTS:  UTA due to body habitus  FUNCTIONAL TESTS:  30 seconds chair stand test 0 reps;  04/10/23 0 reps; 04/12/23 1 rep from high mat table 04/17/23 2 reps from 22 in height w/o UE support 05/10/23 5 reps from 22 in height  2 MWT 230ft with RW;  04/10/23 257ft  6 MWT 04/17/23 6 MWT 442ft with rollator;  05/10/23 6 MWT with rollator 540ft  GAIT: Distance walked: 55ftx2 Assistive device utilized: Environmental consultant - 2 wheeled Level of assistance: Complete Independence Comments: slow cadence, flexed posture   TODAY'S TREATMENT:       [facility] Adult PT Treatment:                                                DATE: 07/19/2023   Therapeutic Exercise: Standing hamstring curl 2x20 BIL ankle weight 3# Standing hip abduction 2x20 BIL ankle weight 3# Standing hip extension 2x20 BIL ankle weight 3# Standing heel raises on airex x 20 ankle weight 3# Taps to 4" step x20 BIL ankle weight 3# Seated LAQ 3# with hold at top 2x10 BIL Seated marching 3# 3x30" Gait rolled into therex with SPC x 100 ft - cues for sequence  Sit to Stand from low chair x 5   [facility] Adult PT Treatment:                                                DATE: 07/12/2023  Therapeutic Exercise: Standing hamstring curl 2x10 BIL ankle weight 2.5# Standing hip abduction 2x10 BIL ankle weight 2.5# Standing hip extension 2x10 BIL ankle weight 2.5# Standing heel raises  2x10 ankle weight 2.5# Taps to 4" step 2x10 BIL ankle weight 2.5# Seated LAQ 4# with hold at top 2x10 BIL Seated marching 4# 3x30" Gait rolled into therex with SPC x 100 ft - cues for sequence  Sit to Stand from low chair intermittent (after  seated rest break and seated exercises) Heel toe raises on airex x 20  Standing marches on airex, 2 x 30 sec    [facility] Adult PT Treatment:                                                DATE: 07/06/23 Therapeutic Exercise: Standing hamstring curl 2x10 BIL Standing hip abduction 2x10 BIL Standing hip extension 2x10 BIL Standing heel raises 2x10 Taps to 4" step 2x10 BIL Seated LAQ 4# with hold at top 2x10 BIL Seated marching 4# 3x30" Seated hamstring curl BlackTB doubled 2x10 BIL Gait rolled into therex with SPC x40 ft - cues for sequence      [facility] Adult PT Treatment:                                                DATE: 06/28/23 Therapeutic Exercise: Standing hamstring curl 2x10 BIL Standing hip abduction 2x10 BIL Standing hip extension 2x10 BIL Standing heel raises 2x10 Seated LAQ 4# with hold at top 2x10 BIL Seated marching 4# 3x30" Seated hamstring curl BlackTB doubled 2x10 BIL Gait rolled into therex with SPC x40 ft - cues for sequence    [facility] Adult PT Treatment:                                                DATE: 06/22/2023  Therapeutic Exercise: 2 x 15 p-ring hip adduction  2 x 15 supine single leg clamshell  2 x 5 sidelying reverse clamshells 15 glute bridges   Therapeutic Activity:  Reassessment of objective measures and subjective assessment regarding progress towards established goals and plan for updated POC, including updated HEP to address ongoing functional deficits as noted in assessment.                                                                                                                   PATIENT EDUCATION:  Education details: Discussed eval findings, rehab rationale and POC and patient is in agreement   Person educated: Patient Education method: Explanation Education comprehension: verbalized understanding and needs further education  HOME EXERCISE PROGRAM: Access Code: [email or web address] URL: [email or web address] Date: 06/22/2023 Prepared by: Mauri Reading  Exercises - Seated Hamstring Stretch  - 2 x daily - 5 x weekly - 2 reps - 30s hold - Sidelying Reverse Clamshell  - 1 x daily - 7 x weekly - 2 sets - 10 reps - Clamshell  - 1 x daily - 7 x weekly - 2 sets - 10 reps - Supine Bridge with Mini Swiss Ball Between Knees  - 1 x daily - 7  x weekly - 2 sets - 10 reps - Sitting Knee Extension with Resistance  - 1 x daily - 7 x weekly - 2 sets - 10 reps  ASSESSMENT:  CLINICAL IMPRESSION:  Michael Payne was able to perform standing exercises with increased resistance and repetitions today without exacerbation of symptoms. He continue to have difficulty performing sit-to-stands from low seat height. Plan is to reassess objective measures and current progress towards established goals at next visit.      OBJECTIVE IMPAIRMENTS: Abnormal gait, decreased activity tolerance, decreased endurance, decreased mobility, difficulty walking, decreased ROM, decreased strength, improper body mechanics, obesity, and pain.   ACTIVITY LIMITATIONS: bending, sitting, standing, squatting, stairs, transfers, bed mobility, and locomotion level  PERSONAL FACTORS: Age, Fitness, Past/current experiences, Time since onset of injury/illness/exacerbation, and 1 comorbidity: obesity  are also affecting patient's functional outcome.   REHAB POTENTIAL: Fair based on body habitus, chronicity and underlying degenerative changes  CLINICAL DECISION MAKING: Stable/uncomplicated  EVALUATION COMPLEXITY: Low   GOALS: Goals reviewed with patient? No  LONG TERM GOALS: Target date: 08/03/2023, last updated 06/22/23   Patient to demonstrate independence in HEP  Baseline: [id number] Goal status: MET  2.  Increase 30s chair  stand test to 1 rep Baseline: 0 reps;  04/10/23 0 reps;  04/12/23 1 reps after several attempts;  04/17/23 2 reps from 22 in height; 05/10/23 5 reps from 22 in height Goal status: DISCONTINUE  3.  Increase distance on 2 MWT to 261ft+ Baseline: 270ft with RW; 225ft w/rollator Goal status: MET  4.  Increase BLE strength to 4-/5 Baseline:  MMT Right eval Left eval  Hip flexion 3+ 3+  Hip extension 3+ 3+  Hip abduction 3+ 3+  Hip adduction    Hip internal rotation    Hip external rotation    Knee flexion 3+ 3+  Knee extension 3+ 3+  Ankle dorsiflexion    Ankle plantarflexion 3+ 3+  Ankle inversion    Ankle eversion     Goal status: DISCONTINUE  Added 04/17/23  5. Increase FOTO Score to 60   Baseline: 43(60 predicted);  04/10/23 47 06/22/23: 47  Goal Status: ONGOING  6. Increase distance on 6 MWT to 512ft with RW --- UPDATED GOALS: 600 ft on 05/10/23   Baseline 415ft with RW;  05/10/23 505 ft  Goal Status: PROGRESSING  Added 06/22/23:   7.  Increase 30s chair stand test to 5 or more repetitions with seat height no greater than 17-20"  Baseline: 5 reps from 22 in height Goal status: PROGRESSING  8. Increase BLE strength to 4/5 Baseline:  MMT Right eval Left eval Right 06/22/23 Left 06/22/23  Hip flexion 3+ 3+ 4 4  Hip extension 3+ 3+    Hip abduction 3+ 3+    Hip adduction      Hip internal rotation      Hip external rotation      Knee flexion 3+ 3+    Knee extension 3+ 3+ 4- 4-  Ankle dorsiflexion      Ankle plantarflexion 3+ 3+    Ankle inversion      Ankle eversion        Goal Status: PROGRESSING    PLAN:  PT FREQUENCY: 1x/week  PT DURATION: 6 weeks, last updated 06/22/23  PLANNED INTERVENTIONS: Therapeutic exercises, Therapeutic activity, Neuromuscular re-education, Balance training, Gait training, Patient/Family education, Self Care, Joint mobilization, Stair training, DME instructions, Aquatic Therapy, Electrical stimulation, Cryotherapy, Moist  heat, Manual therapy, and Re-evaluation  PLAN FOR NEXT  SESSION: HEP review and update, manual techniques as appropriate, aerobic activity to address activity tolerance/endurance deficits, LE strengthening in all directions, hip adduction/flexion mobility activities, increased standing tolerance progression.    Mauri Reading, PT, DPT    07/19/2023, 12:14 PM   Referring diagnosis? B hip pain Treatment diagnosis? (if different than referring diagnosis) B OA of hips What was this (referring dx) caused by? []  Surgery []  Fall []  Ongoing issue [x]  Arthritis []  Other: ____________  Laterality: []  Rt []  Lt [x]  Both  Check all possible CPT codes:  *CHOOSE 10 OR LESS*    [x]  97110 (Therapeutic Exercise)  []  92507 (SLP Treatment)  [x]  97112 (Neuro Re-ed)   []  92526 (Swallowing Treatment)   [x]  40981 (Gait Training)   []  19147 (Cognitive Training, 1st 15 minutes) [x]  97140 (Manual Therapy)   []  97130 (Cognitive Training, each add'l 15 minutes)  [x]  97164 (Re-evaluation)                              []  Other, List CPT Code ____________  [x]  97530 (Therapeutic Activities)     [x]  97535 (Self Care)   [x]  All codes above (97110 - 97535)  []  97012 (Mechanical Traction)  []  97014 (E-stim Unattended)  []  97032 (E-stim manual)  []  97033 (Ionto)  []  97035 (Ultrasound) []  97750 (Physical Performance Training) []  [id number] (Aquatic Therapy) []  97016 (Vasopneumatic Device) []  [id number] (Paraffin) []  97034 (Contrast Bath) []  97597 (Wound Care 1st 20 sq cm) []  97598 (Wound Care each add'l 20 sq cm) []  97760 (Orthotic Fabrication, Fitting, Training Initial) []  [id number] (Prosthetic Management and Training Initial) []  [id number] (Orthotic or Prosthetic Training/ Modification Subsequent)

## 2023-07-19 ENCOUNTER — Ambulatory Visit: Payer: Medicare HMO

## 2023-07-19 DIAGNOSIS — M25551 Pain in right hip: Secondary | ICD-10-CM | POA: Diagnosis not present

## 2023-07-19 DIAGNOSIS — R2689 Other abnormalities of gait and mobility: Secondary | ICD-10-CM

## 2023-07-19 DIAGNOSIS — M6281 Muscle weakness (generalized): Secondary | ICD-10-CM

## 2023-07-19 DIAGNOSIS — M25552 Pain in left hip: Secondary | ICD-10-CM | POA: Diagnosis not present

## 2023-07-22 DIAGNOSIS — F4322 Adjustment disorder with anxiety: Secondary | ICD-10-CM | POA: Diagnosis not present

## 2023-07-25 ENCOUNTER — Encounter: Payer: Self-pay | Admitting: Family Medicine

## 2023-07-25 NOTE — Therapy (Signed)
OUTPATIENT PHYSICAL THERAPY TREATMENT NOTE   Patient Name: Michael Payne MRN: [id number] DOB:December 24, 1970, 52 y.o., male Today's Date: 07/26/2023  Progress Note  Reporting Period 06/22/23 to 07/26/23  See note below for Objective Data and Assessment of Progress/Goals.      END OF SESSION:  PT End of Session - 07/26/23 1439     Visit Number 16    Authorization Type [employer] M[employer]    Authorization Time Period 6 visits 06/22/23-07/22/23    PT Start Time 1135    PT Stop Time 1215    PT Time Calculation (min) 40 min    Activity Tolerance Patient tolerated treatment well    Behavior During Therapy WFL for tasks assessed/performed                  Past Medical History:  Diagnosis Date   Anemia    borderline anemia, was instructed to eat more iron filled foods   Cardiac enlargement 06/03/2022   Noted on Xray   CHF (congestive heart failure) (HCC) 05/2021   GERD (gastroesophageal reflux disease)    lactose intolerance, patient denies symptoms of reflux   Hip pain    History of kidney stones    HTN (hypertension)    Morbid obesity (HCC)    Obesity    Pneumonia 05/2021   Pre-diabetes    Sleep apnea    Past Surgical History:  Procedure Laterality Date   FOOT SURGERY Right    KNEE ARTHROSCOPY WITH MEDIAL MENISECTOMY Left 12/16/2014   Procedure: KNEE ARTHROSCOPY WITH MEDIAL MENISECTOMY;  Surgeon: Sheral Apley, MD;  Location: [facility] OR;  Service: Orthopedics;  Laterality: Left;   UPPER GI ENDOSCOPY N/A 06/20/2022   Procedure: UPPER GI ENDOSCOPY;  Surgeon: Quentin Ore, MD;  Location: [facility];  Service: General;  Laterality: N/A;   Patient Active Problem List   Diagnosis Date Noted   Advance care planning 01/29/2023   Skin lesion 01/29/2023   Chronic pain 01/29/2023   Hyperglycemia 11/01/2022   Morbid obesity with BMI of 60.0-69.9, adult (HCC) 06/20/2022   Right shoulder pain 05/11/2022   Hordeolum externum of right eye 02/18/2022   Nonobstructive transaminitis  06/28/2021   Decubitus ulcer of posterior thigh, stage 2 (HCC) 06/28/2021   Obesity, Class III, BMI 40-49.9 (morbid obesity) (HCC) 06/10/2021   Osteoarthritis 06/10/2021   Pressure injury of skin 06/09/2021   Respiratory failure (HCC) 06/01/2021   Acute diastolic CHF (congestive heart failure) (HCC) 05/31/2021   CHF (congestive heart failure) (HCC) 05/31/2021   Hypoxia 05/31/2021   Gout, unspecified 03/10/2021   Edema 03/10/2021   Muscle spasms of both lower extremities 07/29/2020   Bedbound 05/22/2020   Unilateral osteoarthritis of hip, right 12/27/2019   Severe obstructive sleep apnea 02/22/2018   Hypertriglyceridemia 08/24/2017   Low HDL (under 40) 08/24/2017   Metabolic syndrome 08/24/2017   Morbid obesity (HCC) 10/14/2013   HTN (hypertension) 10/14/2013    PCP: Joaquim Nam, MD  REFERRING PROVIDER: Joaquim Nam, MD  REFERRING DIAG: M16.0 (ICD-10-CM) - Osteoarthritis of both hips, unspecified osteoarthritis type  THERAPY DIAG:  Pain of both hip joints  Other abnormalities of gait and mobility  Muscle weakness (generalized)  Rationale for Evaluation and Treatment: Rehabilitation  ONSET DATE: chronic  SUBJECTIVE:   SUBJECTIVE STATEMENT:   Patient states that he did some work in the yard yesterday, over did it, and is sore today in shoulders/hips/knees. Rates his pain as 6/10. He reports feeling that he has been making  improvement in strength and endurance since last progress note assessment. However, he continue to be limited with transfers, and ability to stand or walk >30 minutes.   PERTINENT HISTORY:  See PMHx  PAIN:  Are you having pain?  Yes: NPRS scale: 6/10 Pain location: B hips Pain description: ache Aggravating factors: activity Relieving factors: medication  PRECAUTIONS: None  WEIGHT BEARING RESTRICTIONS: No  FALLS:  Has patient fallen in last 6 months? No  LIVING ENVIRONMENT: Lives with: lives with their daughter Lives in:  House/apartment Stairs:  5 Has following equipment at home: Environmental consultant - 2 wheeled  OCCUPATION: not working  PLOF: Independent  PATIENT GOALS: To manage my hip pain  OBJECTIVE:   DIAGNOSTIC FINDINGS: none  PATIENT SURVEYS:  FOTO: 43(60 predicted); 04/10/23 47  MUSCLE LENGTH: Hamstrings: Right 45 deg; Left 45 deg (measured in seated)  POSTURE:  unable to assess due to body habitus  PALPATION: Deferred due to body habitus  LOWER EXTREMITY ROM: Functional for gait, transfers and bed mobility   LOWER EXTREMITY MMT:  MMT Right eval Left eval Right 06/22/23 Left 06/22/23 Right 07/26/23 Left 07/26/23  Hip flexion 3+ 3+ 4 4 4+ 4+  Hip extension 3+ 3+      Hip abduction 3+ 3+      Hip adduction        Hip internal rotation        Hip external rotation        Knee flexion 3+ 3+      Knee extension 3+ 3+ 4- 4- 4 4  Ankle dorsiflexion        Ankle plantarflexion 3+ 3+      Ankle inversion        Ankle eversion           LOWER EXTREMITY SPECIAL TESTS:  UTA due to body habitus  FUNCTIONAL TESTS:  30 seconds chair stand test 0 reps;  04/10/23 0 reps; 04/12/23 1 rep from high mat table 04/17/23 2 reps from 22 in height w/o UE support 05/10/23 5 reps from 22 in height  2 MWT 262ft with RW;  04/10/23 220ft  6 MWT 04/17/23 6 MWT 434ft with rollator;  05/10/23 6 MWT with rollator 518ft  GAIT: Distance walked: 69ftx2 Assistive device utilized: Environmental consultant - 2 wheeled Level of assistance: Complete Independence Comments: slow cadence, flexed posture   TODAY'S TREATMENT:       [facility] Adult PT Treatment:                                                DATE: 07/26/2023  Therapeutic Exercise: Standing hamstring curl 2x20 BIL ankle weight 3# Standing hip abduction 2x20 BIL ankle weight 3# Standing hip extension 2x20 BIL ankle weight 3# Standing heel raises on airex x 30 ankle weight 3# Taps to 6" step x20 BIL ankle weight 3# Seated LAQ 3# with hold at top 2x10 BIL Seated marching 3#  3x30" Gait rolled into therex with SPC x 100 ft - cues for sequence   Therapeutic Activity:  Reassessment of objective measures and subjective assessment regarding progress towards established goals and plan for updated POC    [facility] Adult PT Treatment:  DATE: 07/19/2023   Therapeutic Exercise: Standing hamstring curl 2x20 BIL ankle weight 3# Standing hip abduction 2x20 BIL ankle weight 3# Standing hip extension 2x20 BIL ankle weight 3# Standing heel raises on airex x 20 ankle weight 3# Taps to 4" step x20 BIL ankle weight 3# Seated LAQ 3# with hold at top 2x10 BIL Seated marching 3# 3x30" Gait rolled into therex with SPC x 100 ft - cues for sequence  Sit to Stand from low chair x 5   [facility] Adult PT Treatment:                                                DATE: 07/12/2023  Therapeutic Exercise: Standing hamstring curl 2x10 BIL ankle weight 2.5# Standing hip abduction 2x10 BIL ankle weight 2.5# Standing hip extension 2x10 BIL ankle weight 2.5# Standing heel raises 2x10 ankle weight 2.5# Taps to 4" step 2x10 BIL ankle weight 2.5# Seated LAQ 4# with hold at top 2x10 BIL Seated marching 4# 3x30" Gait rolled into therex with SPC x 100 ft - cues for sequence  Sit to Stand from low chair intermittent (after seated rest break and seated exercises) Heel toe raises on airex x 20  Standing marches on airex, 2 x 30 sec                                                                                                                   PATIENT EDUCATION:  Education details: Discussed eval findings, rehab rationale and POC and patient is in agreement  Person educated: Patient Education method: Explanation Education comprehension: verbalized understanding and needs further education  HOME EXERCISE PROGRAM: Access Code: [email or web address] URL: [email or web address] Date: 06/22/2023 Prepared by: Mauri Reading  Exercises - Seated Hamstring  Stretch  - 2 x daily - 5 x weekly - 2 reps - 30s hold - Sidelying Reverse Clamshell  - 1 x daily - 7 x weekly - 2 sets - 10 reps - Clamshell  - 1 x daily - 7 x weekly - 2 sets - 10 reps - Supine Bridge with Mini Swiss Ball Between Knees  - 1 x daily - 7 x weekly - 2 sets - 10 reps - Sitting Knee Extension with Resistance  - 1 x daily - 7 x weekly - 2 sets - 10 reps  ASSESSMENT:  CLINICAL IMPRESSION:   Since last progress note assessment, Haile has met established LE strength goals. He has been making greater progress with activity tolerance during treatment sessions, including increased time with standing exercises. However, he requires at least 4 seated rest breaks during 40 minutes treatment session focused on weight bearing activities. He additionally continues to have difficulty performing sit to stands from low-to-standard chair height. It is recommended at this time that patient continues skilled PT 1x/week for 6 additional weeks, followed by discharged at that time to be independent with HEP.  OBJECTIVE IMPAIRMENTS: Abnormal gait, decreased activity tolerance, decreased endurance, decreased mobility, difficulty walking, decreased ROM, decreased strength, improper body mechanics, obesity, and pain.   ACTIVITY LIMITATIONS: bending, sitting, standing, squatting, stairs, transfers, bed mobility, and locomotion level  PERSONAL FACTORS: Age, Fitness, Past/current experiences, Time since onset of injury/illness/exacerbation, and 1 comorbidity: obesity  are also affecting patient's functional outcome.   REHAB POTENTIAL: Fair based on body habitus, chronicity and underlying degenerative changes  CLINICAL DECISION MAKING: Stable/uncomplicated  EVALUATION COMPLEXITY: Low   GOALS: Goals reviewed with patient? No  LONG TERM GOALS: Target date: 09/06/23, last updated 07/26/23   Patient to demonstrate independence in HEP  Baseline: [id number] Goal status: MET  2.  Increase 30s chair  stand test to 1 rep Baseline: 0 reps;  04/10/23 0 reps;  04/12/23 1 reps after several attempts;  04/17/23 2 reps from 22 in height; 05/10/23 5 reps from 22 in height Goal status: DISCONTINUE  3.  Increase distance on 2 MWT to 217ft+ Baseline: 214ft with RW; 231ft w/rollator Goal status: MET  4.  Increase BLE strength to 4-/5 Baseline:  MMT Right eval Left eval  Hip flexion 3+ 3+  Hip extension 3+ 3+  Hip abduction 3+ 3+  Hip adduction    Hip internal rotation    Hip external rotation    Knee flexion 3+ 3+  Knee extension 3+ 3+  Ankle dorsiflexion    Ankle plantarflexion 3+ 3+  Ankle inversion    Ankle eversion     Goal status: DISCONTINUE  Added 04/17/23  5. Increase FOTO Score to 60   Baseline: 43(60 predicted);  04/10/23 47 06/22/23: 47 07/26/23 50  Goal Status: PROGRESSING  6. Increase distance on 6 MWT to 539ft with RW --- UPDATED GOALS: 600 ft on 05/10/23   Baseline 493ft with RW;  05/10/23 505 ft  Goal Status: PROGRESSING  Added 06/22/23:   7.  Increase 30s chair stand test to 5 or more repetitions with seat height no greater than 17-20"  Baseline: 5 reps from 22 in height Goal status: PROGRESSING  8. Increase BLE strength to 4/5 Baseline:  MMT Right eval Left eval Right 06/22/23 Left 06/22/23 Right 07/26/23 Left 07/26/23  Hip flexion 3+ 3+ 4 4 4+ 4+  Hip extension 3+ 3+      Hip abduction 3+ 3+      Hip adduction        Hip internal rotation        Hip external rotation        Knee flexion 3+ 3+      Knee extension 3+ 3+ 4- 4- 4 4  Ankle dorsiflexion        Ankle plantarflexion 3+ 3+      Ankle inversion        Ankle eversion          Goal Status: MET    PLAN:  PT FREQUENCY: 1x/week  PT DURATION: 6 weeks, last updated 07/26/23  PLANNED INTERVENTIONS: Therapeutic exercises, Therapeutic activity, Neuromuscular re-education, Balance training, Gait training, Patient/Family education, Self Care, Joint mobilization, Stair training, DME  instructions, Aquatic Therapy, Electrical stimulation, Cryotherapy, Moist heat, Manual therapy, and Re-evaluation  PLAN FOR NEXT SESSION: focus on weight bearing activities include LE strengthening, sit-to-stand transfers, gait, stair navigation and functional activities    Mauri Reading, PT, DPT    07/28/2023, 9:50 AM    Referring diagnosis? B hip pain Treatment diagnosis? (if different than referring diagnosis) B OA of hips What was this (referring  dx) caused by? []  Surgery []  Fall []  Ongoing issue [x]  Arthritis []  Other: ____________  Laterality: []  Rt []  Lt [x]  Both  Check all possible CPT codes:  *CHOOSE 10 OR LESS*    [x]  97110 (Therapeutic Exercise)  []  92507 (SLP Treatment)  [x]  97112 (Neuro Re-ed)   []  92526 (Swallowing Treatment)   [x]  62130 (Gait Training)   []  [id number] (Cognitive Training, 1st 15 minutes) [x]  97140 (Manual Therapy)   []  97130 (Cognitive Training, each add'l 15 minutes)  [x]  97164 (Re-evaluation)                              []  Other, List CPT Code ____________  [x]  97530 (Therapeutic Activities)     [x]  97535 (Self Care)   [x]  All codes above (97110 - 97535)  []  97012 (Mechanical Traction)  []  97014 (E-stim Unattended)  []  97032 (E-stim manual)  []  97033 (Ionto)  []  97035 (Ultrasound) []  97750 (Physical Performance Training) []  [id number] (Aquatic Therapy) []  97016 (Vasopneumatic Device) []  [id number] (Paraffin) []  97034 (Contrast Bath) []  97597 (Wound Care 1st 20 sq cm) []  97598 (Wound Care each add'l 20 sq cm) []  97760 (Orthotic Fabrication, Fitting, Training Initial) []  [id number] (Prosthetic Management and Training Initial) []  [id number] (Orthotic or Prosthetic Training/ Modification Subsequent)

## 2023-07-26 ENCOUNTER — Ambulatory Visit: Payer: Medicare HMO | Attending: Family Medicine

## 2023-07-26 DIAGNOSIS — M6281 Muscle weakness (generalized): Secondary | ICD-10-CM | POA: Insufficient documentation

## 2023-07-26 DIAGNOSIS — M25551 Pain in right hip: Secondary | ICD-10-CM | POA: Insufficient documentation

## 2023-07-26 DIAGNOSIS — M25552 Pain in left hip: Secondary | ICD-10-CM | POA: Insufficient documentation

## 2023-07-26 DIAGNOSIS — R2689 Other abnormalities of gait and mobility: Secondary | ICD-10-CM | POA: Insufficient documentation

## 2023-07-26 NOTE — Telephone Encounter (Signed)
Please send a DME order for a single-point bariatric cane.  Dx 19.90.  Thanks.

## 2023-07-27 ENCOUNTER — Other Ambulatory Visit: Payer: Self-pay

## 2023-07-27 DIAGNOSIS — M1611 Unilateral primary osteoarthritis, right hip: Secondary | ICD-10-CM

## 2023-07-27 DIAGNOSIS — I5032 Chronic diastolic (congestive) heart failure: Secondary | ICD-10-CM

## 2023-07-27 DIAGNOSIS — M109 Gout, unspecified: Secondary | ICD-10-CM

## 2023-07-27 NOTE — Telephone Encounter (Signed)
Order placed and community message sent.

## 2023-07-30 NOTE — Progress Notes (Signed)
Your signature is required to indicate approval of the treatment plan as stated above. By signing this report, you are approving the plan of care. Please sign and either send electronically or print and fax the signed copy to the number below. If you approve with modifications, please indicate those in the space provided. Physician Signature: Joaquim Nam, MD 07/30/23 11:40 AM

## 2023-08-02 ENCOUNTER — Ambulatory Visit: Payer: Medicare HMO

## 2023-08-02 DIAGNOSIS — M25551 Pain in right hip: Secondary | ICD-10-CM | POA: Diagnosis not present

## 2023-08-02 DIAGNOSIS — R2689 Other abnormalities of gait and mobility: Secondary | ICD-10-CM

## 2023-08-02 DIAGNOSIS — M25552 Pain in left hip: Secondary | ICD-10-CM | POA: Diagnosis not present

## 2023-08-02 DIAGNOSIS — M6281 Muscle weakness (generalized): Secondary | ICD-10-CM

## 2023-08-02 NOTE — Therapy (Signed)
OUTPATIENT PHYSICAL THERAPY TREATMENT NOTE   Patient Name: Michael Payne MRN: [id number] DOB:Jun 03, 1971, 52 y.o., male Today's Date: 08/02/2023  Progress Note  Reporting Period 06/22/23 to 07/26/23  See note below for Objective Data and Assessment of Progress/Goals.      END OF SESSION:  PT End of Session - 08/02/23 1544     Visit Number 17    Authorization Type [employer] M[employer]    Authorization Time Period 6 visits 07/26/23-09/09/23                  Past Medical History:  Diagnosis Date   Anemia    borderline anemia, was instructed to eat more iron filled foods   Cardiac enlargement 06/03/2022   Noted on Xray   CHF (congestive heart failure) (HCC) 05/2021   GERD (gastroesophageal reflux disease)    lactose intolerance, patient denies symptoms of reflux   Hip pain    History of kidney stones    HTN (hypertension)    Morbid obesity (HCC)    Obesity    Pneumonia 05/2021   Pre-diabetes    Sleep apnea    Past Surgical History:  Procedure Laterality Date   FOOT SURGERY Right    KNEE ARTHROSCOPY WITH MEDIAL MENISECTOMY Left 12/16/2014   Procedure: KNEE ARTHROSCOPY WITH MEDIAL MENISECTOMY;  Surgeon: Sheral Apley, MD;  Location: [facility];  Service: Orthopedics;  Laterality: Left;   UPPER GI ENDOSCOPY N/A 06/20/2022   Procedure: UPPER GI ENDOSCOPY;  Surgeon: Quentin Ore, MD;  Location: [facility];  Service: General;  Laterality: N/A;   Patient Active Problem List   Diagnosis Date Noted   Advance care planning 01/29/2023   Skin lesion 01/29/2023   Chronic pain 01/29/2023   Hyperglycemia 11/01/2022   Morbid obesity with BMI of 60.0-69.9, adult (HCC) 06/20/2022   Right shoulder pain 05/11/2022   Hordeolum externum of right eye 02/18/2022   Nonobstructive transaminitis 06/28/2021   Decubitus ulcer of posterior thigh, stage 2 (HCC) 06/28/2021   Obesity, Class III, BMI 40-49.9 (morbid obesity) (HCC) 06/10/2021   Osteoarthritis 06/10/2021   Pressure injury of  skin 06/09/2021   Respiratory failure (HCC) 06/01/2021   Acute diastolic CHF (congestive heart failure) (HCC) 05/31/2021   CHF (congestive heart failure) (HCC) 05/31/2021   Hypoxia 05/31/2021   Gout, unspecified 03/10/2021   Edema 03/10/2021   Muscle spasms of both lower extremities 07/29/2020   Bedbound 05/22/2020   Unilateral osteoarthritis of hip, right 12/27/2019   Severe obstructive sleep apnea 02/22/2018   Hypertriglyceridemia 08/24/2017   Low HDL (under 40) 08/24/2017   Metabolic syndrome 08/24/2017   Morbid obesity (HCC) 10/14/2013   HTN (hypertension) 10/14/2013    PCP: Joaquim Nam, MD  REFERRING PROVIDER: Joaquim Nam, MD  REFERRING DIAG: M16.0 (ICD-10-CM) - Osteoarthritis of both hips, unspecified osteoarthritis type  THERAPY DIAG:  No diagnosis found.  Rationale for Evaluation and Treatment: Rehabilitation  ONSET DATE: chronic  SUBJECTIVE:   SUBJECTIVE STATEMENT:   Patient reports increased pain after increased activity level at last visit. He reports feeling fatigued today. He did not sleep well last night. However, he is motivated to participate in PT today.   PERTINENT HISTORY:  See PMHx  PAIN:  Are you having pain?  Yes: NPRS scale: 6/10 Pain location: B hips Pain description: ache Aggravating factors: activity Relieving factors: medication  PRECAUTIONS: None  WEIGHT BEARING RESTRICTIONS: No  FALLS:  Has patient fallen in last 6 months? No  LIVING ENVIRONMENT: Lives with:  lives with their daughter Lives in: House/apartment Stairs:  5 Has following equipment at home: Environmental consultant - 2 wheeled  OCCUPATION: not working  PLOF: Independent  PATIENT GOALS: To manage my hip pain  OBJECTIVE:   DIAGNOSTIC FINDINGS: none  PATIENT SURVEYS:  FOTO: 43(60 predicted); 04/10/23 47  MUSCLE LENGTH: Hamstrings: Right 45 deg; Left 45 deg (measured in seated)  POSTURE:  unable to assess due to body habitus  PALPATION: Deferred due to body  habitus  LOWER EXTREMITY ROM: Functional for gait, transfers and bed mobility   LOWER EXTREMITY MMT:  MMT Right eval Left eval Right 06/22/23 Left 06/22/23 Right 07/26/23 Left 07/26/23  Hip flexion 3+ 3+ 4 4 4+ 4+  Hip extension 3+ 3+      Hip abduction 3+ 3+      Hip adduction        Hip internal rotation        Hip external rotation        Knee flexion 3+ 3+      Knee extension 3+ 3+ 4- 4- 4 4  Ankle dorsiflexion        Ankle plantarflexion 3+ 3+      Ankle inversion        Ankle eversion           LOWER EXTREMITY SPECIAL TESTS:  UTA due to body habitus  FUNCTIONAL TESTS:  30 seconds chair stand test 0 reps;  04/10/23 0 reps; 04/12/23 1 rep from high mat table 04/17/23 2 reps from 22 in height w/o UE support 05/10/23 5 reps from 22 in height  2 MWT 249ft with RW;  04/10/23 278ft  6 MWT 04/17/23 6 MWT 457ft with rollator;  05/10/23 6 MWT with rollator 54ft  GAIT: Distance walked: 61ftx2 Assistive device utilized: Environmental consultant - 2 wheeled Level of assistance: Complete Independence Comments: slow cadence, flexed posture   TODAY'S TREATMENT:       [facility] Adult PT Treatment:                                                DATE: 08/02/2023  Therapeutic Exercise: Standing hamstring curl x15 BIL ankle weight 4# Standing hip abduction x15 BIL ankle weight 4# Standing hip extension x15  BIL ankle weight 4# Taps to 6" step 2 x 30 sec ankle weight 4# Seated LAQ 4# with hold at top x 20 BIL Seated marching 4# 2x30" Ambulation to address activity tolerance x 400 ft  181ft with walking sticks BIL 319ft with rollator      [facility] Adult PT Treatment:                                                DATE: 07/26/2023  Therapeutic Exercise: Standing hamstring curl 2x20 BIL ankle weight 3# Standing hip abduction 2x20 BIL ankle weight 3# Standing hip extension 2x20 BIL ankle weight 3# Standing heel raises on airex x 30 ankle weight 3# Taps to 6" step x20 BIL ankle weight 3# Seated  LAQ 3# with hold at top 2x10 BIL Seated marching 3# 3x30" Gait rolled into therex with SPC x 100 ft - cues for sequence   Therapeutic Activity:  Reassessment of objective measures and subjective assessment regarding progress towards  established goals and plan for updated POC    [facility] Adult PT Treatment:                                                DATE: 07/19/2023   Therapeutic Exercise: Standing hamstring curl 2x20 BIL ankle weight 3# Standing hip abduction 2x20 BIL ankle weight 3# Standing hip extension 2x20 BIL ankle weight 3# Standing heel raises on airex x 20 ankle weight 3# Taps to 4" step x20 BIL ankle weight 3# Seated LAQ 3# with hold at top 2x10 BIL Seated marching 3# 3x30" Gait rolled into therex with SPC x 100 ft - cues for sequence  Sit to Stand from low chair x 5   [facility] Adult PT Treatment:                                                DATE: 07/12/2023  Therapeutic Exercise: Standing hamstring curl 2x10 BIL ankle weight 2.5# Standing hip abduction 2x10 BIL ankle weight 2.5# Standing hip extension 2x10 BIL ankle weight 2.5# Standing heel raises 2x10 ankle weight 2.5# Taps to 4" step 2x10 BIL ankle weight 2.5# Seated LAQ 4# with hold at top 2x10 BIL Seated marching 4# 3x30" Gait rolled into therex with SPC x 100 ft - cues for sequence  Sit to Stand from low chair intermittent (after seated rest break and seated exercises) Heel toe raises on airex x 20  Standing marches on airex, 2 x 30 sec                                                                                                                   PATIENT EDUCATION:  Education details: Discussed eval findings, rehab rationale and POC and patient is in agreement  Person educated: Patient Education method: Explanation Education comprehension: verbalized understanding and needs further education  HOME EXERCISE PROGRAM: Access Code: [email or web address] URL: [email or web address] Date: 06/22/2023 Prepared  by: Mauri Reading  Exercises - Seated Hamstring Stretch  - 2 x daily - 5 x weekly - 2 reps - 30s hold - Sidelying Reverse Clamshell  - 1 x daily - 7 x weekly - 2 sets - 10 reps - Clamshell  - 1 x daily - 7 x weekly - 2 sets - 10 reps - Supine Bridge with Mini Swiss Ball Between Knees  - 1 x daily - 7 x weekly - 2 sets - 10 reps - Sitting Knee Extension with Resistance  - 1 x daily - 7 x weekly - 2 sets - 10 reps  ASSESSMENT:  CLINICAL IMPRESSION:   Meet was limited during today's session d/t pain and fatigue that began yesterday. However, he is able to perform standing exercises with increased resistance. He did require frequent rest  breaks. We will resume repetitions and progression of weight bearing activities in order to progress towards functional goals. Plan is to continue 1x/week x 4 weeks.     OBJECTIVE IMPAIRMENTS: Abnormal gait, decreased activity tolerance, decreased endurance, decreased mobility, difficulty walking, decreased ROM, decreased strength, improper body mechanics, obesity, and pain.   ACTIVITY LIMITATIONS: bending, sitting, standing, squatting, stairs, transfers, bed mobility, and locomotion level  PERSONAL FACTORS: Age, Fitness, Past/current experiences, Time since onset of injury/illness/exacerbation, and 1 comorbidity: obesity  are also affecting patient's functional outcome.   REHAB POTENTIAL: Fair based on body habitus, chronicity and underlying degenerative changes  CLINICAL DECISION MAKING: Stable/uncomplicated  EVALUATION COMPLEXITY: Low   GOALS: Goals reviewed with patient? No  LONG TERM GOALS: Target date: 09/06/23, last updated 07/26/23   Patient to demonstrate independence in HEP  Baseline: [id number] Goal status: MET  2.  Increase 30s chair stand test to 1 rep Baseline: 0 reps;  04/10/23 0 reps;  04/12/23 1 reps after several attempts;  04/17/23 2 reps from 22 in height; 05/10/23 5 reps from 22 in height Goal status: DISCONTINUE  3.   Increase distance on 2 MWT to 267ft+ Baseline: 258ft with RW; 213ft w/rollator Goal status: MET  4.  Increase BLE strength to 4-/5 Baseline:  MMT Right eval Left eval  Hip flexion 3+ 3+  Hip extension 3+ 3+  Hip abduction 3+ 3+  Hip adduction    Hip internal rotation    Hip external rotation    Knee flexion 3+ 3+  Knee extension 3+ 3+  Ankle dorsiflexion    Ankle plantarflexion 3+ 3+  Ankle inversion    Ankle eversion     Goal status: DISCONTINUE  Added 04/17/23  5. Increase FOTO Score to 60   Baseline: 43(60 predicted);  04/10/23 47 06/22/23: 47 07/26/23 50  Goal Status: PROGRESSING  6. Increase distance on 6 MWT to 519ft with RW --- UPDATED GOALS: 600 ft on 05/10/23   Baseline 445ft with RW;  05/10/23 505 ft  Goal Status: PROGRESSING  Added 06/22/23:   7.  Increase 30s chair stand test to 5 or more repetitions with seat height no greater than 17-20"  Baseline: 5 reps from 22 in height Goal status: PROGRESSING  8. Increase BLE strength to 4/5 Baseline:  MMT Right eval Left eval Right 06/22/23 Left 06/22/23 Right 07/26/23 Left 07/26/23  Hip flexion 3+ 3+ 4 4 4+ 4+  Hip extension 3+ 3+      Hip abduction 3+ 3+      Hip adduction        Hip internal rotation        Hip external rotation        Knee flexion 3+ 3+      Knee extension 3+ 3+ 4- 4- 4 4  Ankle dorsiflexion        Ankle plantarflexion 3+ 3+      Ankle inversion        Ankle eversion          Goal Status: MET    PLAN:  PT FREQUENCY: 1x/week  PT DURATION: 6 weeks, last updated 07/26/23  PLANNED INTERVENTIONS: Therapeutic exercises, Therapeutic activity, Neuromuscular re-education, Balance training, Gait training, Patient/Family education, Self Care, Joint mobilization, Stair training, DME instructions, Aquatic Therapy, Electrical stimulation, Cryotherapy, Moist heat, Manual therapy, and Re-evaluation  PLAN FOR NEXT SESSION: focus on weight bearing activities include LE strengthening,  sit-to-stand transfers, gait, stair navigation and functional activities    Mauri Reading, PT,  DPT    08/02/2023, 4:10 PM    Referring diagnosis? B hip pain Treatment diagnosis? (if different than referring diagnosis) B OA of hips What was this (referring dx) caused by? []  Surgery []  Fall []  Ongoing issue [x]  Arthritis []  Other: ____________  Laterality: []  Rt []  Lt [x]  Both  Check all possible CPT codes:  *CHOOSE 10 OR LESS*    [x]  97110 (Therapeutic Exercise)  []  92507 (SLP Treatment)  [x]  97112 (Neuro Re-ed)   []  92526 (Swallowing Treatment)   [x]  40981 (Gait Training)   []  [id number] (Cognitive Training, 1st 15 minutes) [x]  97140 (Manual Therapy)   []  97130 (Cognitive Training, each add'l 15 minutes)  [x]  97164 (Re-evaluation)                              []  Other, List CPT Code ____________  [x]  97530 (Therapeutic Activities)     [x]  97535 (Self Care)   [x]  All codes above (97110 - 97535)  []  97012 (Mechanical Traction)  []  97014 (E-stim Unattended)  []  97032 (E-stim manual)  []  97033 (Ionto)  []  97035 (Ultrasound) []  97750 (Physical Performance Training) []  [id number] (Aquatic Therapy) []  97016 (Vasopneumatic Device) []  [id number] (Paraffin) []  97034 (Contrast Bath) []  97597 (Wound Care 1st 20 sq cm) []  97598 (Wound Care each add'l 20 sq cm) []  97760 (Orthotic Fabrication, Fitting, Training Initial) []  [id number] (Prosthetic Management and Training Initial) []  [id number] (Orthotic or Prosthetic Training/ Modification Subsequent)

## 2023-08-05 DIAGNOSIS — F4322 Adjustment disorder with anxiety: Secondary | ICD-10-CM | POA: Diagnosis not present

## 2023-08-09 ENCOUNTER — Ambulatory Visit: Payer: Medicare HMO

## 2023-08-16 ENCOUNTER — Ambulatory Visit: Payer: Medicare HMO

## 2023-08-16 DIAGNOSIS — R2689 Other abnormalities of gait and mobility: Secondary | ICD-10-CM | POA: Diagnosis not present

## 2023-08-16 DIAGNOSIS — M25551 Pain in right hip: Secondary | ICD-10-CM

## 2023-08-16 DIAGNOSIS — M6281 Muscle weakness (generalized): Secondary | ICD-10-CM

## 2023-08-16 DIAGNOSIS — M25552 Pain in left hip: Secondary | ICD-10-CM | POA: Diagnosis not present

## 2023-08-16 NOTE — Therapy (Signed)
OUTPATIENT PHYSICAL THERAPY TREATMENT NOTE   Patient Name: Michael Payne MRN: [id number] DOB:May 15, 1971, 52 y.o., male Today's Date: 08/16/2023      END OF SESSION:  PT End of Session - 08/16/23 1041     Visit Number 18    Number of Visits --    Date for PT Re-Evaluation 08/03/23    Authorization Type [employer] M[employer]    Authorization Time Period 6 visits 07/26/23-09/09/23    Authorization - Visit Number 3    Authorization - Number of Visits 6    PT Start Time 1015    PT Stop Time 1042    PT Time Calculation (min) 27 min    Activity Tolerance Patient limited by fatigue    Behavior During Therapy [facility] for tasks assessed/performed                   Past Medical History:  Diagnosis Date   Anemia    borderline anemia, was instructed to eat more iron filled foods   Cardiac enlargement 06/03/2022   Noted on Xray   CHF (congestive heart failure) (HCC) 05/2021   GERD (gastroesophageal reflux disease)    lactose intolerance, patient denies symptoms of reflux   Hip pain    History of kidney stones    HTN (hypertension)    Morbid obesity (HCC)    Obesity    Pneumonia 05/2021   Pre-diabetes    Sleep apnea    Past Surgical History:  Procedure Laterality Date   FOOT SURGERY Right    KNEE ARTHROSCOPY WITH MEDIAL MENISECTOMY Left 12/16/2014   Procedure: KNEE ARTHROSCOPY WITH MEDIAL MENISECTOMY;  Surgeon: Sheral Apley, MD;  Location: [facility] OR;  Service: Orthopedics;  Laterality: Left;   UPPER GI ENDOSCOPY N/A 06/20/2022   Procedure: UPPER GI ENDOSCOPY;  Surgeon: Quentin Ore, MD;  Location: [facility];  Service: General;  Laterality: N/A;   Patient Active Problem List   Diagnosis Date Noted   Advance care planning 01/29/2023   Skin lesion 01/29/2023   Chronic pain 01/29/2023   Hyperglycemia 11/01/2022   Morbid obesity with BMI of 60.0-69.9, adult (HCC) 06/20/2022   Right shoulder pain 05/11/2022   Hordeolum externum of right eye 02/18/2022   Nonobstructive  transaminitis 06/28/2021   Decubitus ulcer of posterior thigh, stage 2 (HCC) 06/28/2021   Obesity, Class III, BMI 40-49.9 (morbid obesity) (HCC) 06/10/2021   Osteoarthritis 06/10/2021   Pressure injury of skin 06/09/2021   Respiratory failure (HCC) 06/01/2021   Acute diastolic CHF (congestive heart failure) (HCC) 05/31/2021   CHF (congestive heart failure) (HCC) 05/31/2021   Hypoxia 05/31/2021   Gout, unspecified 03/10/2021   Edema 03/10/2021   Muscle spasms of both lower extremities 07/29/2020   Bedbound 05/22/2020   Unilateral osteoarthritis of hip, right 12/27/2019   Severe obstructive sleep apnea 02/22/2018   Hypertriglyceridemia 08/24/2017   Low HDL (under 40) 08/24/2017   Metabolic syndrome 08/24/2017   Morbid obesity (HCC) 10/14/2013   HTN (hypertension) 10/14/2013    PCP: Joaquim Nam, MD  REFERRING PROVIDER: Joaquim Nam, MD  REFERRING DIAG: M16.0 (ICD-10-CM) - Osteoarthritis of both hips, unspecified osteoarthritis type  THERAPY DIAG:  Pain of both hip joints  Other abnormalities of gait and mobility  Muscle weakness (generalized)  Rationale for Evaluation and Treatment: Rehabilitation  ONSET DATE: chronic  SUBJECTIVE:   SUBJECTIVE STATEMENT:   Patient reports that he has some pain today, but has improved as compared to last session. "It's my normal pain."  PERTINENT HISTORY:  See PMHx  PAIN:  Are you having pain?  Yes: NPRS scale: 6/10 Pain location: B hips Pain description: ache Aggravating factors: activity Relieving factors: medication  PRECAUTIONS: None  WEIGHT BEARING RESTRICTIONS: No  FALLS:  Has patient fallen in last 6 months? No  LIVING ENVIRONMENT: Lives with: lives with their daughter Lives in: House/apartment Stairs:  5 Has following equipment at home: Environmental consultant - 2 wheeled  OCCUPATION: not working  PLOF: Independent  PATIENT GOALS: To manage my hip pain  OBJECTIVE:   DIAGNOSTIC FINDINGS: none  PATIENT  SURVEYS:  FOTO: 43(60 predicted); 04/10/23 47  MUSCLE LENGTH: Hamstrings: Right 45 deg; Left 45 deg (measured in seated)  POSTURE:  unable to assess due to body habitus  PALPATION: Deferred due to body habitus  LOWER EXTREMITY ROM: Functional for gait, transfers and bed mobility   LOWER EXTREMITY MMT:  MMT Right eval Left eval Right 06/22/23 Left 06/22/23 Right 07/26/23 Left 07/26/23  Hip flexion 3+ 3+ 4 4 4+ 4+  Hip extension 3+ 3+      Hip abduction 3+ 3+      Hip adduction        Hip internal rotation        Hip external rotation        Knee flexion 3+ 3+      Knee extension 3+ 3+ 4- 4- 4 4  Ankle dorsiflexion        Ankle plantarflexion 3+ 3+      Ankle inversion        Ankle eversion           LOWER EXTREMITY SPECIAL TESTS:  UTA due to body habitus  FUNCTIONAL TESTS:  30 seconds chair stand test 0 reps;  04/10/23 0 reps; 04/12/23 1 rep from high mat table 04/17/23 2 reps from 22 in height w/o UE support 05/10/23 5 reps from 22 in height  2 MWT 292ft with RW;  04/10/23 272ft  6 MWT 04/17/23 6 MWT 491ft with rollator;  05/10/23 6 MWT with rollator 553ft  GAIT: Distance walked: 67ftx2 Assistive device utilized: Environmental consultant - 2 wheeled Level of assistance: Complete Independence Comments: slow cadence, flexed posture   TODAY'S TREATMENT:      [facility] Adult PT Treatment:                                                DATE: 08/16/2023  Therapeutic Exercise:  Resisted walking forward/backwards x 2 laps blue TB Resisted walking lateral x 3 laps blue TB  Step over hurdles (4), 2 laps lateral with 6# ankle weight Standing hamstring curl x15 BIL ankle weight 6# Standing hip abduction x15 BIL ankle weight 6# Standing hip extension x15  BIL ankle weight 6# Taps to 6" step x 20 each ankle weight 6# Seated LAQ 4# with hold at top x 25 BIL   [facility] Adult PT Treatment:                                                DATE: 08/02/2023  Therapeutic Exercise: Standing hamstring  curl x15 BIL ankle weight 4# Standing hip abduction x15 BIL ankle weight 4# Standing hip extension x15  BIL ankle weight 4# Taps to  6" step 2 x 30 sec ankle weight 4# Seated LAQ 4# with hold at top x 20 BIL Seated marching 4# 2x30" Ambulation to address activity tolerance x 400 ft  123ft with walking sticks BIL 354ft with rollator      [facility] Adult PT Treatment:                                                DATE: 07/26/2023  Therapeutic Exercise: Standing hamstring curl 2x20 BIL ankle weight 3# Standing hip abduction 2x20 BIL ankle weight 3# Standing hip extension 2x20 BIL ankle weight 3# Standing heel raises on airex x 30 ankle weight 3# Taps to 6" step x20 BIL ankle weight 3# Seated LAQ 3# with hold at top 2x10 BIL Seated marching 3# 3x30" Gait rolled into therex with SPC x 100 ft - cues for sequence   Therapeutic Activity:  Reassessment of objective measures and subjective assessment regarding progress towards established goals and plan for updated POC                                                                                                                   PATIENT EDUCATION:  Education details: Discussed eval findings, rehab rationale and POC and patient is in agreement  Person educated: Patient Education method: Explanation Education comprehension: verbalized understanding and needs further education  HOME EXERCISE PROGRAM: Access Code: [email or web address] URL: [email or web address] Date: 06/22/2023 Prepared by: Mauri Reading  Exercises - Seated Hamstring Stretch  - 2 x daily - 5 x weekly - 2 reps - 30s hold - Sidelying Reverse Clamshell  - 1 x daily - 7 x weekly - 2 sets - 10 reps - Clamshell  - 1 x daily - 7 x weekly - 2 sets - 10 reps - Supine Bridge with Mini Swiss Ball Between Knees  - 1 x daily - 7 x weekly - 2 sets - 10 reps - Sitting Knee Extension with Resistance  - 1 x daily - 7 x weekly - 2 sets - 10 reps  ASSESSMENT:  CLINICAL IMPRESSION:    Despite shortened treatment time today, Breon was able to make great progress today. Increased ankle weight from 4# to 6# each LE, and incorporated resisted walking tasks.  We will continue to progress functional strengthening program and updated HEP in preparation for discharge in next 2-3 visits.     OBJECTIVE IMPAIRMENTS: Abnormal gait, decreased activity tolerance, decreased endurance, decreased mobility, difficulty walking, decreased ROM, decreased strength, improper body mechanics, obesity, and pain.   ACTIVITY LIMITATIONS: bending, sitting, standing, squatting, stairs, transfers, bed mobility, and locomotion level  PERSONAL FACTORS: Age, Fitness, Past/current experiences, Time since onset of injury/illness/exacerbation, and 1 comorbidity: obesity  are also affecting patient's functional outcome.   REHAB POTENTIAL: Fair based on body habitus, chronicity and underlying degenerative changes  CLINICAL DECISION MAKING: Stable/uncomplicated  EVALUATION COMPLEXITY: Low  GOALS: Goals reviewed with patient? No  LONG TERM GOALS: Target date: 09/06/23, last updated 07/26/23   Patient to demonstrate independence in HEP  Baseline: [id number] Goal status: MET  2.  Increase 30s chair stand test to 1 rep Baseline: 0 reps;  04/10/23 0 reps;  04/12/23 1 reps after several attempts;  04/17/23 2 reps from 22 in height; 05/10/23 5 reps from 22 in height Goal status: DISCONTINUE  3.  Increase distance on 2 MWT to 218ft+ Baseline: 257ft with RW; 222ft w/rollator Goal status: MET  4.  Increase BLE strength to 4-/5 Baseline:  MMT Right eval Left eval  Hip flexion 3+ 3+  Hip extension 3+ 3+  Hip abduction 3+ 3+  Hip adduction    Hip internal rotation    Hip external rotation    Knee flexion 3+ 3+  Knee extension 3+ 3+  Ankle dorsiflexion    Ankle plantarflexion 3+ 3+  Ankle inversion    Ankle eversion     Goal status: DISCONTINUE  Added 04/17/23  5. Increase FOTO Score to 60    Baseline: 43(60 predicted);  04/10/23 47 06/22/23: 47 07/26/23 50  Goal Status: PROGRESSING  6. Increase distance on 6 MWT to 516ft with RW --- UPDATED GOALS: 600 ft on 05/10/23   Baseline 414ft with RW;  05/10/23 505 ft  Goal Status: PROGRESSING  Added 06/22/23:   7.  Increase 30s chair stand test to 5 or more repetitions with seat height no greater than 17-20"  Baseline: 5 reps from 22 in height Goal status: PROGRESSING  8. Increase BLE strength to 4/5 Baseline:  MMT Right eval Left eval Right 06/22/23 Left 06/22/23 Right 07/26/23 Left 07/26/23  Hip flexion 3+ 3+ 4 4 4+ 4+  Hip extension 3+ 3+      Hip abduction 3+ 3+      Hip adduction        Hip internal rotation        Hip external rotation        Knee flexion 3+ 3+      Knee extension 3+ 3+ 4- 4- 4 4  Ankle dorsiflexion        Ankle plantarflexion 3+ 3+      Ankle inversion        Ankle eversion          Goal Status: MET    PLAN:  PT FREQUENCY: 1x/week  PT DURATION: 6 weeks, last updated 07/26/23  PLANNED INTERVENTIONS: Therapeutic exercises, Therapeutic activity, Neuromuscular re-education, Balance training, Gait training, Patient/Family education, Self Care, Joint mobilization, Stair training, DME instructions, Aquatic Therapy, Electrical stimulation, Cryotherapy, Moist heat, Manual therapy, and Re-evaluation  PLAN FOR NEXT SESSION: focus on weight bearing activities include LE strengthening, sit-to-stand transfers, gait, stair navigation and functional activities    Mauri Reading, PT, DPT    08/16/2023, 11:39 AM    Referring diagnosis? B hip pain Treatment diagnosis? (if different than referring diagnosis) B OA of hips What was this (referring dx) caused by? []  Surgery []  Fall []  Ongoing issue [x]  Arthritis []  Other: ____________  Laterality: []  Rt []  Lt [x]  Both  Check all possible CPT codes:  *CHOOSE 10 OR LESS*    [x]  97110 (Therapeutic Exercise)  []  92507 (SLP Treatment)  [x]  86578  (Neuro Re-ed)   []  46962 (Swallowing Treatment)   [x]  97116 (Gait Training)   []  [id number] (Cognitive Training, 1st 15 minutes) [x]  97140 (Manual Therapy)   []  97130 (Cognitive Training, each add'l 15 minutes)  [  x] [phone] (Re-evaluation)                              []  Other, List CPT Code ____________  [x]  97530 (Therapeutic Activities)     [x]  97535 (Self Care)   [x]  All codes above (97110 - 97535)  []  97012 (Mechanical Traction)  []  97014 (E-stim Unattended)  []  97032 (E-stim manual)  []  97033 (Ionto)  []  97035 (Ultrasound) []  97750 (Physical Performance Training) []  [id number] (Aquatic Therapy) []  97016 (Vasopneumatic Device) []  [id number] (Paraffin) []  97034 (Contrast Bath) []  97597 (Wound Care 1st 20 sq cm) []  97598 (Wound Care each add'l 20 sq cm) []  97760 (Orthotic Fabrication, Fitting, Training Initial) []  [id number] (Prosthetic Management and Training Initial) []  [id number] (Orthotic or Prosthetic Training/ Modification Subsequent)

## 2023-08-19 DIAGNOSIS — F4322 Adjustment disorder with anxiety: Secondary | ICD-10-CM | POA: Diagnosis not present

## 2023-08-23 ENCOUNTER — Ambulatory Visit: Payer: Medicare HMO | Attending: Family Medicine

## 2023-08-23 DIAGNOSIS — M6281 Muscle weakness (generalized): Secondary | ICD-10-CM | POA: Diagnosis not present

## 2023-08-23 DIAGNOSIS — M25552 Pain in left hip: Secondary | ICD-10-CM | POA: Insufficient documentation

## 2023-08-23 DIAGNOSIS — R2689 Other abnormalities of gait and mobility: Secondary | ICD-10-CM | POA: Diagnosis not present

## 2023-08-23 DIAGNOSIS — M25551 Pain in right hip: Secondary | ICD-10-CM | POA: Diagnosis not present

## 2023-08-23 NOTE — Therapy (Signed)
OUTPATIENT PHYSICAL THERAPY TREATMENT NOTE   Patient Name: Michael Payne MRN: [id number] DOB:01/05/1971, 52 y.o., male Today's Date: 08/23/2023      END OF SESSION:  PT End of Session - 08/23/23 1042     Visit Number 19    Authorization Type [employer] M[employer]    Authorization Time Period 6 visits 07/26/23-09/09/23    Authorization - Visit Number 4    Authorization - Number of Visits 6    PT Start Time 1045    PT Stop Time 1125    PT Time Calculation (min) 40 min    Activity Tolerance Patient limited by fatigue    Behavior During Therapy [facility] for tasks assessed/performed                   Past Medical History:  Diagnosis Date   Anemia    borderline anemia, was instructed to eat more iron filled foods   Cardiac enlargement 06/03/2022   Noted on Xray   CHF (congestive heart failure) (HCC) 05/2021   GERD (gastroesophageal reflux disease)    lactose intolerance, patient denies symptoms of reflux   Hip pain    History of kidney stones    HTN (hypertension)    Morbid obesity (HCC)    Obesity    Pneumonia 05/2021   Pre-diabetes    Sleep apnea    Past Surgical History:  Procedure Laterality Date   FOOT SURGERY Right    KNEE ARTHROSCOPY WITH MEDIAL MENISECTOMY Left 12/16/2014   Procedure: KNEE ARTHROSCOPY WITH MEDIAL MENISECTOMY;  Surgeon: Sheral Apley, MD;  Location: [facility] OR;  Service: Orthopedics;  Laterality: Left;   UPPER GI ENDOSCOPY N/A 06/20/2022   Procedure: UPPER GI ENDOSCOPY;  Surgeon: Quentin Ore, MD;  Location: [facility];  Service: General;  Laterality: N/A;   Patient Active Problem List   Diagnosis Date Noted   Advance care planning 01/29/2023   Skin lesion 01/29/2023   Chronic pain 01/29/2023   Hyperglycemia 11/01/2022   Morbid obesity with BMI of 60.0-69.9, adult (HCC) 06/20/2022   Right shoulder pain 05/11/2022   Hordeolum externum of right eye 02/18/2022   Nonobstructive transaminitis 06/28/2021   Decubitus ulcer of posterior thigh,  stage 2 (HCC) 06/28/2021   Obesity, Class III, BMI 40-49.9 (morbid obesity) (HCC) 06/10/2021   Osteoarthritis 06/10/2021   Pressure injury of skin 06/09/2021   Respiratory failure (HCC) 06/01/2021   Acute diastolic CHF (congestive heart failure) (HCC) 05/31/2021   CHF (congestive heart failure) (HCC) 05/31/2021   Hypoxia 05/31/2021   Gout, unspecified 03/10/2021   Edema 03/10/2021   Muscle spasms of both lower extremities 07/29/2020   Bedbound 05/22/2020   Unilateral osteoarthritis of hip, right 12/27/2019   Severe obstructive sleep apnea 02/22/2018   Hypertriglyceridemia 08/24/2017   Low HDL (under 40) 08/24/2017   Metabolic syndrome 08/24/2017   Morbid obesity (HCC) 10/14/2013   HTN (hypertension) 10/14/2013    PCP: Joaquim Nam, MD  REFERRING PROVIDER: Joaquim Nam, MD  REFERRING DIAG: M16.0 (ICD-10-CM) - Osteoarthritis of both hips, unspecified osteoarthritis type  THERAPY DIAG:  Pain of both hip joints  Other abnormalities of gait and mobility  Muscle weakness (generalized)  Rationale for Evaluation and Treatment: Rehabilitation  ONSET DATE: chronic  SUBJECTIVE:   SUBJECTIVE STATEMENT:   Patient denies any worsening of symptoms since last visit.   PERTINENT HISTORY:  See PMHx  PAIN:  Are you having pain?  Yes: NPRS scale: 6/10 Pain location: B hips Pain description: ache  Aggravating factors: activity Relieving factors: medication  PRECAUTIONS: None  WEIGHT BEARING RESTRICTIONS: No  FALLS:  Has patient fallen in last 6 months? No  LIVING ENVIRONMENT: Lives with: lives with their daughter Lives in: House/apartment Stairs:  5 Has following equipment at home: Environmental consultant - 2 wheeled  OCCUPATION: not working  PLOF: Independent  PATIENT GOALS: To manage my hip pain  OBJECTIVE:   DIAGNOSTIC FINDINGS: none  PATIENT SURVEYS:  FOTO: 43(60 predicted); 04/10/23 47  MUSCLE LENGTH: Hamstrings: Right 45 deg; Left 45 deg (measured in  seated)  POSTURE:  unable to assess due to body habitus  PALPATION: Deferred due to body habitus  LOWER EXTREMITY ROM: Functional for gait, transfers and bed mobility   LOWER EXTREMITY MMT:  MMT Right eval Left eval Right 06/22/23 Left 06/22/23 Right 07/26/23 Left 07/26/23  Hip flexion 3+ 3+ 4 4 4+ 4+  Hip extension 3+ 3+      Hip abduction 3+ 3+      Hip adduction        Hip internal rotation        Hip external rotation        Knee flexion 3+ 3+      Knee extension 3+ 3+ 4- 4- 4 4  Ankle dorsiflexion        Ankle plantarflexion 3+ 3+      Ankle inversion        Ankle eversion           LOWER EXTREMITY SPECIAL TESTS:  UTA due to body habitus  FUNCTIONAL TESTS:  30 seconds chair stand test 0 reps;  04/10/23 0 reps; 04/12/23 1 rep from high mat table 04/17/23 2 reps from 22 in height w/o UE support 05/10/23 5 reps from 22 in height  2 MWT 265ft with RW;  04/10/23 256ft  6 MWT 04/17/23 6 MWT 433ft with rollator;  05/10/23 6 MWT with rollator 565ft  GAIT: Distance walked: 62ftx2 Assistive device utilized: Environmental consultant - 2 wheeled Level of assistance: Complete Independence Comments: slow cadence, flexed posture   TODAY'S TREATMENT:      [facility] Adult PT Treatment:                                                DATE: 08/23/2023  Therapeutic Exercise:  STS from elevated surface, 5 x 5 Walking  x 4.5 minutes  Resisted walking forward/backwards x 2 laps blue TB Resisted walking lateral x 3 laps blue TB  Step over hurdles (4), 2 laps lateral with 6# ankle weight Standing hamstring curl x15 BIL ankle weight 6# Standing hip abduction x15 BIL ankle weight 6# Standing hip extension x15  BIL ankle weight 6# Seated LAQ 4# with hold at top x 15 BIL  [facility] Adult PT Treatment:                                                DATE: 08/16/2023  Therapeutic Exercise:  Resisted walking forward/backwards x 2 laps blue TB Resisted walking lateral x 3 laps blue TB  Step over hurdles (4),  2 laps lateral with 6# ankle weight Standing hamstring curl x15 BIL ankle weight 6# Standing hip abduction x15 BIL ankle weight 6# Standing hip extension x15  BIL ankle weight 6# Taps to 6" step x 20 each ankle weight 6# Seated LAQ 4# with hold at top x 25 BIL   [facility] Adult PT Treatment:                                                DATE: 08/02/2023  Therapeutic Exercise: Standing hamstring curl x15 BIL ankle weight 4# Standing hip abduction x15 BIL ankle weight 4# Standing hip extension x15  BIL ankle weight 4# Taps to 6" step 2 x 30 sec ankle weight 4# Seated LAQ 4# with hold at top x 20 BIL Seated marching 4# 2x30" Ambulation to address activity tolerance x 400 ft  161ft with walking sticks BIL 367ft with rollator                                                                                                                      PATIENT EDUCATION:  Education details: Discussed eval findings, rehab rationale and POC and patient is in agreement  Person educated: Patient Education method: Explanation Education comprehension: verbalized understanding and needs further education  HOME EXERCISE PROGRAM: Access Code: [email or web address] URL: [email or web address] Date: 06/22/2023 Prepared by: Mauri Reading  Exercises - Seated Hamstring Stretch  - 2 x daily - 5 x weekly - 2 reps - 30s hold - Sidelying Reverse Clamshell  - 1 x daily - 7 x weekly - 2 sets - 10 reps - Clamshell  - 1 x daily - 7 x weekly - 2 sets - 10 reps - Supine Bridge with Mini Swiss Ball Between Knees  - 1 x daily - 7 x weekly - 2 sets - 10 reps - Sitting Knee Extension with Resistance  - 1 x daily - 7 x weekly - 2 sets - 10 reps  ASSESSMENT:  CLINICAL IMPRESSION:   Jaret is demonstrating great progress towards established goals, including ability to walk 600 ft in less than 6 minutes today. He continues to perform standing exercises with 6# ankle weight with only intermittent feedback. We will further  address established goals and discuss independent plan following discharge at next visit.     OBJECTIVE IMPAIRMENTS: Abnormal gait, decreased activity tolerance, decreased endurance, decreased mobility, difficulty walking, decreased ROM, decreased strength, improper body mechanics, obesity, and pain.   ACTIVITY LIMITATIONS: bending, sitting, standing, squatting, stairs, transfers, bed mobility, and locomotion level  PERSONAL FACTORS: Age, Fitness, Past/current experiences, Time since onset of injury/illness/exacerbation, and 1 comorbidity: obesity  are also affecting patient's functional outcome.   REHAB POTENTIAL: Fair based on body habitus, chronicity and underlying degenerative changes  CLINICAL DECISION MAKING: Stable/uncomplicated  EVALUATION COMPLEXITY: Low   GOALS: Goals reviewed with patient? No  LONG TERM GOALS: Target date: 09/06/23, last updated 07/26/23   Patient to demonstrate independence in HEP  Baseline: [id number] Goal status: MET  2.  Increase 30s chair stand test  to 1 rep Baseline: 0 reps;  04/10/23 0 reps;  04/12/23 1 reps after several attempts;  04/17/23 2 reps from 22 in height; 05/10/23 5 reps from 22 in height Goal status: DISCONTINUE  3.  Increase distance on 2 MWT to 258ft+ Baseline: 244ft with RW; 270ft w/rollator Goal status: MET  4.  Increase BLE strength to 4-/5 Baseline:  MMT Right eval Left eval  Hip flexion 3+ 3+  Hip extension 3+ 3+  Hip abduction 3+ 3+  Hip adduction    Hip internal rotation    Hip external rotation    Knee flexion 3+ 3+  Knee extension 3+ 3+  Ankle dorsiflexion    Ankle plantarflexion 3+ 3+  Ankle inversion    Ankle eversion     Goal status: DISCONTINUE  Added 04/17/23  5. Increase FOTO Score to 60   Baseline: 43(60 predicted);  04/10/23 47 06/22/23: 47 07/26/23 50  Goal Status: PROGRESSING  6. Increase distance on 6 MWT to 534ft with RW --- UPDATED GOALS: 600 ft on 05/10/23   Baseline 430ft with RW;   05/10/23 505 ft 08/23/23: 616ft (4.5 minutes)  Goal Status: MET  Added 06/22/23:   7.  Increase 30s chair stand test to 5 or more repetitions with seat height no greater than 17-20"  Baseline: 5 reps from 22 in height Goal status: PROGRESSING  8. Increase BLE strength to 4/5 Baseline:  MMT Right eval Left eval Right 06/22/23 Left 06/22/23 Right 07/26/23 Left 07/26/23  Hip flexion 3+ 3+ 4 4 4+ 4+  Hip extension 3+ 3+      Hip abduction 3+ 3+      Hip adduction        Hip internal rotation        Hip external rotation        Knee flexion 3+ 3+      Knee extension 3+ 3+ 4- 4- 4 4  Ankle dorsiflexion        Ankle plantarflexion 3+ 3+      Ankle inversion        Ankle eversion          Goal Status: MET    PLAN:  PT FREQUENCY: 1x/week  PT DURATION: 6 weeks, last updated 07/26/23  PLANNED INTERVENTIONS: Therapeutic exercises, Therapeutic activity, Neuromuscular re-education, Balance training, Gait training, Patient/Family education, Self Care, Joint mobilization, Stair training, DME instructions, Aquatic Therapy, Electrical stimulation, Cryotherapy, Moist heat, Manual therapy, and Re-evaluation  PLAN FOR NEXT SESSION: focus on weight bearing activities include LE strengthening, sit-to-stand transfers, gait, stair navigation and functional activities    Mauri Reading, PT, DPT    08/23/2023, 11:28 AM    Referring diagnosis? B hip pain Treatment diagnosis? (if different than referring diagnosis) B OA of hips What was this (referring dx) caused by? []  Surgery []  Fall []  Ongoing issue [x]  Arthritis []  Other: ____________  Laterality: []  Rt []  Lt [x]  Both  Check all possible CPT codes:  *CHOOSE 10 OR LESS*    [x]  09811 (Therapeutic Exercise)  []  92507 (SLP Treatment)  [x]  97112 (Neuro Re-ed)   []  92526 (Swallowing Treatment)   [x]  91478 (Gait Training)   []  [id number] (Cognitive Training, 1st 15 minutes) [x]  97140 (Manual Therapy)   []  97130 (Cognitive Training, each  add'l 15 minutes)  [x]  97164 (Re-evaluation)                              []   Other, List CPT Code ____________  [x]  97530 (Therapeutic Activities)     [x]  97535 (Self Care)   [x]  All codes above (97110 - 97535)  []  97012 (Mechanical Traction)  []  97014 (E-stim Unattended)  []  97032 (E-stim manual)  []  97033 (Ionto)  []  97035 (Ultrasound) []  97750 (Physical Performance Training) []  27062 (Aquatic Therapy) []  97016 (Vasopneumatic Device) []  [id number] (Paraffin) []  97034 (Contrast Bath) []  97597 (Wound Care 1st 20 sq cm) []  97598 (Wound Care each add'l 20 sq cm) []  97760 (Orthotic Fabrication, Fitting, Training Initial) []  [id number] (Prosthetic Management and Training Initial) []  [id number] (Orthotic or Prosthetic Training/ Modification Subsequent)

## 2023-08-25 ENCOUNTER — Telehealth: Payer: Self-pay | Admitting: Family Medicine

## 2023-08-25 ENCOUNTER — Other Ambulatory Visit: Payer: Self-pay | Admitting: Family Medicine

## 2023-08-25 NOTE — Telephone Encounter (Signed)
Last office visit: 01/26/23 Next office visit: nothing scheduled Last refill: oxyCODONE-aceta/minophen (PERCOCET) 10-325 MG tablet 05/02/23

## 2023-08-25 NOTE — Telephone Encounter (Signed)
Prescription Request  08/25/2023  LOV: 01/26/2023  What is the name of the medication or equipment? oxyCODONE-acetaminophen (PERCOCET) 10-325 MG tablet   Have you contacted your pharmacy to request a refill? No   Which pharmacy would you like this sent to?  [id number] [place],  - [address] [place] [address] Phone: [phone] Fax: [phone]    Patient notified that their request is being sent to the clinical staff for review and that they should receive a response within 2 business days.   Please advise at Mobile [phone] (mobile)  Requested 90 day supply

## 2023-08-25 NOTE — Telephone Encounter (Signed)
Refill request has been sent to Dr. Para March from previous message.

## 2023-08-27 MED ORDER — OXYCODONE-ACETAMINOPHEN 10-325 MG PO TABS: 1.0000 | ORAL_TABLET | Freq: Four times a day (QID) | ORAL | 0 refills | Status: DC | PRN

## 2023-08-27 NOTE — Telephone Encounter (Signed)
Sent. Okay to continue.

## 2023-08-28 NOTE — Telephone Encounter (Signed)
LAST APPOINTMENT DATE: 01/26/23   NEXT APPOINTMENT DATE: Visit date not found    LAST REFILL:08/27/23 x3  QTY: #150

## 2023-08-29 NOTE — Telephone Encounter (Signed)
Prev sent.   

## 2023-08-30 ENCOUNTER — Ambulatory Visit: Payer: Medicare HMO

## 2023-08-30 DIAGNOSIS — R2689 Other abnormalities of gait and mobility: Secondary | ICD-10-CM

## 2023-08-30 DIAGNOSIS — M25552 Pain in left hip: Secondary | ICD-10-CM | POA: Diagnosis not present

## 2023-08-30 DIAGNOSIS — M6281 Muscle weakness (generalized): Secondary | ICD-10-CM | POA: Diagnosis not present

## 2023-08-30 DIAGNOSIS — M25551 Pain in right hip: Secondary | ICD-10-CM | POA: Diagnosis not present

## 2023-08-30 NOTE — Therapy (Addendum)
 OUTPATIENT PHYSICAL THERAPY TREATMENT NOTE     Discharge Summary Visits from Start of Care: 20  Current functional level related to goals / functional outcomes: See assessment   Remaining deficits: See assessment.   Education / Equipment: HEP   Patient agrees to discharge. Patient goals were not met. Patient is being discharged due being pleased with current LOF the last visit.  Joneen Fresh PT, DPT, LAT, ATC  07/17/24  8:42 AM  Patient Name: Michael Payne MRN: [id number] DOB:[birth year], 52 y.o., male Today's Date: 08/30/2023     END OF SESSION:  PT End of Session - 08/30/23 1057     Visit Number 20    Authorization Type [employer] MCR    Authorization Time Period 6 visits 07/26/23-09/09/23    Authorization - Visit Number 5    Authorization - Number of Visits 6    PT Start Time 1046    PT Stop Time 1125    PT Time Calculation (min) 39 min    Activity Tolerance Patient limited by fatigue    Behavior During Therapy [facility] for tasks assessed/performed                    Past Medical History:  Diagnosis Date   Anemia    borderline anemia, was instructed to eat more iron filled foods   Cardiac enlargement 06/03/2022   Noted on Xray   CHF (congestive heart failure) (HCC) 05/2021   GERD (gastroesophageal reflux disease)    lactose intolerance, patient denies symptoms of reflux   Hip pain    History of kidney stones    HTN (hypertension)    Morbid obesity (HCC)    Obesity    Pneumonia 05/2021   Pre-diabetes    Sleep apnea    Past Surgical History:  Procedure Laterality Date   FOOT SURGERY Right    KNEE ARTHROSCOPY WITH MEDIAL MENISECTOMY Left 12/16/2014   Procedure: KNEE ARTHROSCOPY WITH MEDIAL MENISECTOMY;  Surgeon: Evalene JONETTA Chancy, MD;  Location: [facility] OR;  Service: Orthopedics;  Laterality: Left;   UPPER GI ENDOSCOPY N/A 06/20/2022   Procedure: UPPER GI ENDOSCOPY;  Surgeon: Lyndel Deward PARAS, MD;  Location: [facility];  Service: General;   Laterality: N/A;   Patient Active Problem List   Diagnosis Date Noted   Advance care planning 01/29/2023   Skin lesion 01/29/2023   Chronic pain 01/29/2023   Hyperglycemia 11/01/2022   Morbid obesity with BMI of 60.0-69.9, adult (HCC) 06/20/2022   Right shoulder pain 05/11/2022   Hordeolum externum of right eye 02/18/2022   Nonobstructive transaminitis 06/28/2021   Decubitus ulcer of posterior thigh, stage 2 (HCC) 06/28/2021   Obesity, Class III, BMI 40-49.9 (morbid obesity) (HCC) 06/10/2021   Osteoarthritis 06/10/2021   Pressure injury of skin 06/09/2021   Respiratory failure (HCC) 06/01/2021   Acute diastolic CHF (congestive heart failure) (HCC) 05/31/2021   CHF (congestive heart failure) (HCC) 05/31/2021   Hypoxia 05/31/2021   Gout, unspecified 03/10/2021   Edema 03/10/2021   Muscle spasms of both lower extremities 07/29/2020   Bedbound 05/22/2020   Unilateral osteoarthritis of hip, right 12/27/2019   Severe obstructive sleep apnea 02/22/2018   Hypertriglyceridemia 08/24/2017   Low HDL (under 40) 08/24/2017   Metabolic syndrome 08/24/2017   Morbid obesity (HCC) 10/14/2013   HTN (hypertension) 10/14/2013    PCP: Cleatus Arlyss RAMAN, MD  REFERRING PROVIDER: Cleatus Arlyss RAMAN, MD  REFERRING DIAG: M16.0 (ICD-10-CM) - Osteoarthritis of both hips, unspecified osteoarthritis type  THERAPY DIAG:  Pain of both hip joints  Other abnormalities of gait and mobility  Muscle weakness (generalized)  Rationale for Evaluation and Treatment: Rehabilitation  ONSET DATE: chronic  SUBJECTIVE:   SUBJECTIVE STATEMENT:   Patient denies any worsening of symptoms since last visit.   PERTINENT HISTORY:  See PMHx  PAIN:  Are you having pain?  Yes: NPRS scale: 6/10 Pain location: B hips Pain description: ache Aggravating factors: activity Relieving factors: medication  PRECAUTIONS: None  WEIGHT BEARING RESTRICTIONS: No  FALLS:  Has patient fallen in last 6 months?  No  LIVING ENVIRONMENT: Lives with: lives with their daughter Lives in: House/apartment Stairs: 5 Has following equipment at home: Environmental Consultant - 2 wheeled  OCCUPATION: not working  PLOF: Independent  PATIENT GOALS: To manage my hip pain  OBJECTIVE:   DIAGNOSTIC FINDINGS: none  PATIENT SURVEYS:  FOTO: 43(60 predicted); 04/10/23 47  MUSCLE LENGTH: Hamstrings: Right 45 deg; Left 45 deg (measured in seated)  POSTURE: unable to assess due to body habitus  PALPATION: Deferred due to body habitus  LOWER EXTREMITY ROM: Functional for gait, transfers and bed mobility   LOWER EXTREMITY MMT:  MMT Right eval Left eval Right 06/22/23 Left 06/22/23 Right 07/26/23 Left 07/26/23  Hip flexion 3+ 3+ 4 4 4+ 4+  Hip extension 3+ 3+      Hip abduction 3+ 3+      Hip adduction        Hip internal rotation        Hip external rotation        Knee flexion 3+ 3+      Knee extension 3+ 3+ 4- 4- 4 4  Ankle dorsiflexion        Ankle plantarflexion 3+ 3+      Ankle inversion        Ankle eversion           LOWER EXTREMITY SPECIAL TESTS:  UTA due to body habitus  FUNCTIONAL TESTS:  30 seconds chair stand test 0 reps;  04/10/23 0 reps; 04/12/23 1 rep from high mat table 04/17/23 2 reps from 22 in height w/o UE support 05/10/23 5 reps from 22 in height  2 MWT 265ft with RW;  04/10/23 254ft  6 MWT 04/17/23 6 MWT 461ft with rollator;  05/10/23 6 MWT with rollator 578ft  GAIT: Distance walked: 42ftx2 Assistive device utilized: Environmental Consultant - 2 wheeled Level of assistance: Complete Independence Comments: slow cadence, flexed posture   TODAY'S TREATMENT:      [facility] Adult PT Treatment:                                                DATE: 08/30/2023  Therapeutic Exercise:  STS from elevated surface, 5 x 5 Resisted walking forward/backwards x 3 laps blue TB Resisted walking lateral x 3 laps blue TB  Standing hamstring curl x15 BIL ankle weight 6# Standing hip abduction x15 BIL ankle weight  6# Standing hip extension x15  BIL ankle weight 6# Seated LAQ 4# with hold at top 2 x 15 BIL  Therapeutic Activity:  Patient education regarding RW vs SPC indications, including energy conservation needs   [facility] Adult PT Treatment:  DATE: 08/23/2023  Therapeutic Exercise:  STS from elevated surface, 5 x 5 Walking  x 4.5 minutes  Resisted walking forward/backwards x 2 laps blue TB Resisted walking lateral x 3 laps blue TB  Step over hurdles (4), 2 laps lateral with 6# ankle weight Standing hamstring curl x15 BIL ankle weight 6# Standing hip abduction x15 BIL ankle weight 6# Standing hip extension x15  BIL ankle weight 6# Seated LAQ 4# with hold at top x 15 BIL  [facility] Adult PT Treatment:                                                DATE: 08/16/2023  Therapeutic Exercise:  Resisted walking forward/backwards x 2 laps blue TB Resisted walking lateral x 3 laps blue TB  Step over hurdles (4), 2 laps lateral with 6# ankle weight Standing hamstring curl x15 BIL ankle weight 6# Standing hip abduction x15 BIL ankle weight 6# Standing hip extension x15  BIL ankle weight 6# Taps to 6 step x 20 each ankle weight 6# Seated LAQ 4# with hold at top x 25 BIL   [facility] Adult PT Treatment:                                                DATE: 08/02/2023  Therapeutic Exercise: Standing hamstring curl x15 BIL ankle weight 4# Standing hip abduction x15 BIL ankle weight 4# Standing hip extension x15  BIL ankle weight 4# Taps to 6 step 2 x 30 sec ankle weight 4# Seated LAQ 4# with hold at top x 20 BIL Seated marching 4# 2x30 Ambulation to address activity tolerance x 400 ft  15ft with walking sticks BIL 358ft with rollator                                                                                                                      PATIENT EDUCATION:  Education details: Discussed eval findings, rehab rationale and POC and patient is in  agreement  Person educated: Patient Education method: Explanation Education comprehension: verbalized understanding and needs further education  HOME EXERCISE PROGRAM: Access Code: [email or web address] URL: [email or web address] Date: 06/22/2023 Prepared by: Marko Molt  Exercises - Seated Hamstring Stretch  - 2 x daily - 5 x weekly - 2 reps - 30s hold - Sidelying Reverse Clamshell  - 1 x daily - 7 x weekly - 2 sets - 10 reps - Clamshell  - 1 x daily - 7 x weekly - 2 sets - 10 reps - Supine Bridge with Mini Swiss Ball Between Knees  - 1 x daily - 7 x weekly - 2 sets - 10 reps - Sitting Knee Extension with Resistance  - 1 x daily - 7 x weekly -  2 sets - 10 reps  ASSESSMENT:  CLINICAL IMPRESSION:   Michael Payne continue to demonstrate improved muscular endurance, including with performance of sit to stand. He endorses understanding of RW vs SPC indications and limitations following patient education today. He would benefit from one additional visit next week to maximize rehab outcomes and promote independence with HEP. Patient endorses understanding to be discharged from skilled PT after end of current insurance authorization on 09/09/2023, including in the event that he is unable to attend PT next week.     OBJECTIVE IMPAIRMENTS: Abnormal gait, decreased activity tolerance, decreased endurance, decreased mobility, difficulty walking, decreased ROM, decreased strength, improper body mechanics, obesity, and pain.   ACTIVITY LIMITATIONS: bending, sitting, standing, squatting, stairs, transfers, bed mobility, and locomotion level  PERSONAL FACTORS: Age, Fitness, Past/current experiences, Time since onset of injury/illness/exacerbation, and 1 comorbidity: obesity are also affecting patient's functional outcome.   REHAB POTENTIAL: Fair based on body habitus, chronicity and underlying degenerative changes  CLINICAL DECISION MAKING: Stable/uncomplicated  EVALUATION COMPLEXITY:  Low   GOALS: Goals reviewed with patient? No  LONG TERM GOALS: Target date: 09/06/23, last updated 07/26/23   Patient to demonstrate independence in HEP  Baseline: [id number] Goal status: MET  2.  Increase 30s chair stand test to 1 rep Baseline: 0 reps;  04/10/23 0 reps;  04/12/23 1 reps after several attempts;  04/17/23 2 reps from 22 in height; 05/10/23 5 reps from 22 in height Goal status: DISCONTINUE  3.  Increase distance on 2 MWT to 251ft+ Baseline: 240ft with RW; 290ft w/rollator Goal status: MET  4.  Increase BLE strength to 4-/5 Baseline:  MMT Right eval Left eval  Hip flexion 3+ 3+  Hip extension 3+ 3+  Hip abduction 3+ 3+  Hip adduction    Hip internal rotation    Hip external rotation    Knee flexion 3+ 3+  Knee extension 3+ 3+  Ankle dorsiflexion    Ankle plantarflexion 3+ 3+  Ankle inversion    Ankle eversion     Goal status: DISCONTINUE  Added 04/17/23  5. Increase FOTO Score to 60   Baseline: 43(60 predicted);  04/10/23 47 06/22/23: 47 07/26/23 50  Goal Status: PROGRESSING  6. Increase distance on 6 MWT to 586ft with RW --- UPDATED GOALS: 600 ft on 05/10/23   Baseline 469ft with RW;  05/10/23 505 ft 08/23/23: 664ft (4.5 minutes)  Goal Status: MET  Added 06/22/23:   7.  Increase 30s chair stand test to 5 or more repetitions with seat height no greater than 17-20  Baseline: 5 reps from 22 in height Goal status: PROGRESSING  8. Increase BLE strength to 4/5 Baseline:  MMT Right eval Left eval Right 06/22/23 Left 06/22/23 Right 07/26/23 Left 07/26/23  Hip flexion 3+ 3+ 4 4 4+ 4+  Hip extension 3+ 3+      Hip abduction 3+ 3+      Hip adduction        Hip internal rotation        Hip external rotation        Knee flexion 3+ 3+      Knee extension 3+ 3+ 4- 4- 4 4  Ankle dorsiflexion        Ankle plantarflexion 3+ 3+      Ankle inversion        Ankle eversion          Goal Status: MET    PLAN:  PT FREQUENCY: 1x/week  PT DURATION: 6  weeks, last updated 07/26/23  PLANNED INTERVENTIONS: Therapeutic exercises, Therapeutic activity, Neuromuscular re-education, Balance training, Gait training, Patient/Family education, Self Care, Joint mobilization, Stair training, DME instructions, Aquatic Therapy, Electrical stimulation, Cryotherapy, Moist heat, Manual therapy, and Re-evaluation  PLAN FOR NEXT SESSION: focus on weight bearing activities include LE strengthening, sit-to-stand transfers, gait, stair navigation and functional activities    Marko Molt, PT, DPT    08/30/2023, 5:24 PM    Referring diagnosis? B hip pain Treatment diagnosis? (if different than referring diagnosis) B OA of hips What was this (referring dx) caused by? []  Surgery []  Fall []  Ongoing issue [x]  Arthritis []  Other: ____________  Laterality: []  Rt []  Lt [x]  Both  Check all possible CPT codes:  *CHOOSE 10 OR LESS*    [x]  97110 (Therapeutic Exercise)  []  92507 (SLP Treatment)  [x]  02887 (Neuro Re-ed)   []  92526 (Swallowing Treatment)   [x]  97116 (Gait Training)   []  [id number] (Cognitive Training, 1st 15 minutes) [x]  97140 (Manual Therapy)   []  97130 (Cognitive Training, each add'l 15 minutes)  [x]  97164 (Re-evaluation)                              []  Other, List CPT Code ____________  [x]  97530 (Therapeutic Activities)     [x]  97535 (Self Care)   [x]  All codes above (97110 - 97535)  []  97012 (Mechanical Traction)  []  97014 (E-stim Unattended)  []  97032 (E-stim manual)  []  97033 (Ionto)  []  97035 (Ultrasound) []  97750 (Physical Performance Training) []  [id number] (Aquatic Therapy) []  97016 (Vasopneumatic Device) []  [id number] (Paraffin) []  97034 (Contrast Bath) []  97597 (Wound Care 1st 20 sq cm) []  97598 (Wound Care each add'l 20 sq cm) []  97760 (Orthotic Fabrication, Fitting, Training Initial) []  [id number] (Prosthetic Management and Training Initial) []  [id number] (Orthotic or Prosthetic Training/ Modification Subsequent)

## 2023-09-06 LAB — FECAL OCCULT BLOOD, GUAIAC

## 2023-09-14 LAB — FECAL OCCULT BLOOD, IMMUNOCHEMICAL: IFOBT: NEGATIVE

## 2023-09-16 DIAGNOSIS — F4322 Adjustment disorder with anxiety: Secondary | ICD-10-CM | POA: Diagnosis not present

## 2023-09-26 ENCOUNTER — Encounter: Payer: Self-pay | Admitting: Family Medicine

## 2023-09-27 ENCOUNTER — Encounter: Payer: Self-pay | Admitting: Family Medicine

## 2023-09-27 ENCOUNTER — Other Ambulatory Visit: Payer: Self-pay | Admitting: Family Medicine

## 2023-09-27 MED ORDER — OXYCODONE-ACETAMINOPHEN 10-325 MG PO TABS: 1.0000 | ORAL_TABLET | Freq: Four times a day (QID) | ORAL | 0 refills | Status: DC | PRN

## 2023-09-27 NOTE — Telephone Encounter (Signed)
 Please check with pharmacy.  He should have rx available.  Please let me know if he doesn't.  Thanks.

## 2023-09-27 NOTE — Telephone Encounter (Signed)
 I typed the letter, please print and send to patient.  Thanks.

## 2023-09-28 ENCOUNTER — Telehealth: Payer: Self-pay

## 2023-09-28 NOTE — Patient Outreach (Signed)
  Care Coordination   Follow Up Visit Note   09/28/2023 Name: Michael Payne MRN: [id number] DOB: [birth year]  Michael Payne is a 53 y.o. year old male who sees Cleatus Arlyss RAMAN, MD for primary care. I spoke with  Michael Payne by phone today.  What matters to the patients health and wellness today?  Patient states he is doing wonderful.  He states he is able to get out of the house often.  He reports his outpatient PT ended 08/2023.  Patient states he continues to use his rollator for ambulation and denies any falls. Patient states he continues to have some knee and leg pain.  He states this is more of a chronic problem for him.  Patient states he hasn't checked his blood pressure. He reports current weight is 387 lbs down from 625.  RN case gaffer patient on all of his hard work and success with weight loss.  Patient denies having any further needs at this time and is agreeable that care coordination goals have been met.     Goals Addressed             This Visit's Progress    Patient Stated:  Ongoing weight loss / management of osteoarthritis       Interventions Today    Flowsheet Row Most Recent Value  Chronic Disease   Chronic disease during today's visit Hypertension (HTN), Other  [chronic joint pain/ osteoarthritis.]  General Interventions   General Interventions Discussed/Reviewed General Interventions Reviewed, Doctor Visits  [evaluation of current treatment plan for HTN/ osteoarthritis and patients adherence to plan as established by provider.  Assessed for BP readings.]  Doctor Visits Discussed/Reviewed Doctor Visits Reviewed  bethann upcoming provider visits. Advised to contact provider office to schedule yearly physical.]  Exercise Interventions   Exercise Discussed/Reviewed Physical Activity  [Assessed patients current physical activity and exercise routine. Encouraged patient to continue doing PT instructed exercises at home.]  Education  Interventions   Education Provided Provided Education  [Advised to notify provider for any new or ongoing symptoms. Advised to contact provider or RN case manager directly if care management services are needed in the future.]  Provided Verbal Education On Other  [Advised to follow up with dentist for any further concerns regarding recent dental extraction. Advised to periodically check BP and notify provider for readings outside of established parameters.]  Pharmacy Interventions   Pharmacy Dicussed/Reviewed Pharmacy Topics Reviewed  [medications reviewe/ compliance discussed.]  Safety Interventions   Safety Discussed/Reviewed Fall Risk  [Advised to continue use of ambulatory device as recommended and for safety / fall prevention.]                 SDOH assessments and interventions completed:  Yes  SDOH Interventions Today    Flowsheet Row Most Recent Value  SDOH Interventions   Food Insecurity Interventions Intervention Not Indicated  Housing Interventions Intervention Not Indicated  Transportation Interventions Intervention Not Indicated  Utilities Interventions Intervention Not Indicated        Care Coordination Interventions:  Yes, provided   Follow up plan: No further intervention required.   Encounter Outcome:  Patient Visit Completed   Harlin Mazzoni RN,BSN,CCM [facility]  [facility], [facility] coordinator / Case Manager Phone: [phone]

## 2023-09-28 NOTE — Patient Instructions (Signed)
 Visit Information  Thank you for taking time to visit with me today. You have met your care coordination goals. .   What we discussed today:   Goals Addressed             This Visit's Progress    Patient Stated:  Ongoing weight loss / management of osteoarthritis       Interventions Today    Flowsheet Row Most Recent Value  Chronic Disease   Chronic disease during today's visit Hypertension (HTN), Other  [chronic joint pain/ osteoarthritis.]  General Interventions   General Interventions Discussed/Reviewed General Interventions Reviewed, Doctor Visits  [evaluation of current treatment plan for HTN/ osteoarthritis and patients adherence to plan as established by provider.  Assessed for BP readings.]  Doctor Visits Discussed/Reviewed Doctor Visits Reviewed  bethann upcoming provider visits. Advised to contact provider office to schedule yearly physical.]  Exercise Interventions   Exercise Discussed/Reviewed Physical Activity  [Assessed patients current physical activity and exercise routine. Encouraged patient to continue doing PT instructed exercises at home.]  Education Interventions   Education Provided Provided Education  [Advised to notify provider for any new or ongoing symptoms. Advised to contact provider or RN case manager directly if care management services are needed in the future.]  Provided Verbal Education On Other  [Advised to follow up with dentist for any further concerns regarding recent dental extraction. Advised to periodically check BP and notify provider for readings outside of established parameters.]  Pharmacy Interventions   Pharmacy Dicussed/Reviewed Pharmacy Topics Reviewed  [medications reviewe/ compliance discussed.]  Safety Interventions   Safety Discussed/Reviewed Fall Risk  [Advised to continue use of ambulatory device as recommended and for safety / fall prevention.]                 Please contact your provider if you feel care coordination  services are needed in the future.  If you are experiencing a Mental Health or Behavioral Health Crisis or need someone to talk to, please call the Suicide and Crisis Lifeline: 988 call 1-800-273-TALK (toll free, 24 hour hotline)  Patient verbalizes understanding of instructions and care plan provided today and agrees to view in MyChart. Active MyChart status and patient understanding of how to access instructions and care plan via MyChart confirmed with patient.     Arvin Seip RN,BSN,CCM [place]  [facility], [facility] coordinator / Case Manager Phone: [phone]

## 2023-09-30 DIAGNOSIS — F4322 Adjustment disorder with anxiety: Secondary | ICD-10-CM | POA: Diagnosis not present

## 2023-10-06 ENCOUNTER — Encounter: Payer: Self-pay | Admitting: Family Medicine

## 2023-10-21 DIAGNOSIS — F4322 Adjustment disorder with anxiety: Secondary | ICD-10-CM | POA: Diagnosis not present

## 2023-10-26 ENCOUNTER — Other Ambulatory Visit: Payer: Self-pay | Admitting: Family Medicine

## 2023-10-27 ENCOUNTER — Other Ambulatory Visit: Payer: Self-pay | Admitting: Family Medicine

## 2023-10-27 NOTE — Telephone Encounter (Unsigned)
 Copied from CRM [id number]. Topic: Clinical - Medication Refill >> Oct 27, 2023 11:15 AM Jinnie BROCKS wrote: Most Recent Primary Care Visit:  Provider: TANDA ROJELIO ORN  Department: [facility]  Visit Type: MEDICARE AWV, SEQUENTIAL  Date: 03/21/2023  Medication: oxyCODONE -acetaminophen  (PERCOCET) 10-325 MG tablet  Has the patient contacted their pharmacy? Yes (Agent: If no, request that the patient contact the pharmacy for the refill. If patient does not wish to contact the pharmacy document the reason why and proceed with request.) (Agent: If yes, when and what did the pharmacy advise?)  Is this the correct pharmacy for this prescription? Yes If no, delete pharmacy and type the correct one.  This is the patient's preferred pharmacy:  [id number] [place], [place] - [address] [place] [address] Phone: [phone] Fax: [phone]  [facility] - [place], [place] - [address] [place] [address] Phone: [phone] Fax: [phone]   Has the prescription been filled recently? No  Is the patient out of the medication? Yes  Has the patient been seen for an appointment in the last year OR does the patient have an upcoming appointment? Yes  Can we respond through MyChart? Yes  Agent: Please be advised that Rx refills may take up to 3 business days. We ask that you follow-up with your pharmacy.

## 2023-10-29 MED ORDER — OXYCODONE-ACETAMINOPHEN 10-325 MG PO TABS: 1.0000 | ORAL_TABLET | Freq: Four times a day (QID) | ORAL | 0 refills | Status: DC | PRN

## 2023-10-29 NOTE — Telephone Encounter (Signed)
 Sent. Thanks.  Please schedule yearly visit for this spring when possible.  We can do labs at the visit.

## 2023-10-30 NOTE — Telephone Encounter (Signed)
 Please schedule for yearly visit. Thank you

## 2023-10-31 NOTE — Telephone Encounter (Signed)
Pt had cpe scheduled already for 03/26/24

## 2023-11-11 DIAGNOSIS — F4322 Adjustment disorder with anxiety: Secondary | ICD-10-CM | POA: Diagnosis not present

## 2023-11-23 ENCOUNTER — Telehealth: Payer: Self-pay

## 2023-11-23 NOTE — Telephone Encounter (Signed)
 Spoke with patient to set up an appointment for him to come in to be seen because we received a form from [facility]. Per patient he does not use those supplies any longer. Will notify [employer].

## 2023-11-24 NOTE — Telephone Encounter (Signed)
 Noted. Thanks.

## 2023-11-30 ENCOUNTER — Ambulatory Visit: Admitting: Family Medicine

## 2023-12-06 ENCOUNTER — Telehealth: Payer: Self-pay | Admitting: Family Medicine

## 2023-12-06 NOTE — Telephone Encounter (Signed)
 Ppw from [facility] mailed in from pt for Dr. Para March to comp. Ppw is in Duncan's folder.

## 2023-12-07 ENCOUNTER — Ambulatory Visit (INDEPENDENT_AMBULATORY_CARE_PROVIDER_SITE_OTHER): Admitting: Family Medicine

## 2023-12-07 ENCOUNTER — Encounter: Payer: Self-pay | Admitting: Family Medicine

## 2023-12-07 VITALS — BP 132/80 | HR 87 | Temp 98.6°F | Ht 73.78 in | Wt >= 6400 oz

## 2023-12-07 DIAGNOSIS — I5032 Chronic diastolic (congestive) heart failure: Secondary | ICD-10-CM | POA: Diagnosis not present

## 2023-12-07 DIAGNOSIS — G4733 Obstructive sleep apnea (adult) (pediatric): Secondary | ICD-10-CM

## 2023-12-07 DIAGNOSIS — M160 Bilateral primary osteoarthritis of hip: Secondary | ICD-10-CM | POA: Diagnosis not present

## 2023-12-07 NOTE — Progress Notes (Signed)
 Son living with patient.  D/w pt.   Divorce finalized.   He graduated from physical therapy.  Still using walker for distance, ie walking a block.  He is putting up with hip pain at baseline.  Still dealing with chronic pain.  Walking more, putting up with pain at baseline.  No sedated.  Sore with walking, with inc mobility.  Hip and knee pain at baseline.  Not constipated.  Not sedated.  Still using CPAP, nightly.  Occ inadvertently taking off in the midst of the night while asleep.  Otherwise he reports compliance.  Sleeping well with use.  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  He had paperwork regarding CPAP supplies.  I need his CPAP compliance report and his pressure setting to finish filling out his paperwork.  Discussed.  He can get me that information.  He feels good about the progress he has made.  He was able to come to the visit today and walk into clinic.    See DMV forms for extra detail.  History of CHF.  Lungs are clear.  Compliant with medications.  Blood pressure controlled.  Meds, vitals, and allergies reviewed.   ROS: Per HPI unless specifically indicated in ROS section   Nad Ncat Neck supple, no LA Rrr Ctab Abd soft, not ttp Skin well-perfused. Normal range of motion of the arms but he has limited range of motion at the hips as expected bilaterally. Trace BLE edema.   30 minutes were devoted to patient care in this encounter (this includes time spent reviewing the patient's file/history, interviewing and examining the patient, counseling/reviewing plan with patient).

## 2023-12-07 NOTE — Patient Instructions (Addendum)
 Lab at the visit on 03/26/24.  You don't have to fast.    Let me know about your pressure setting and I'll work on the form.  Please send me a copy of your CPAP compliance report.  Take care.  Glad to see you.

## 2023-12-07 NOTE — Telephone Encounter (Signed)
 See OV note.  Thanks.

## 2023-12-07 NOTE — Telephone Encounter (Signed)
 Placed in your tray

## 2023-12-10 NOTE — Assessment & Plan Note (Signed)
 Continue oxycodone at baseline.  Not sedated.  He is working on weight reduction.  He has made significant progress.  His mobility has improved.  He is not yet able to drive but he has made significant progress.  Recheck periodically.

## 2023-12-10 NOTE — Assessment & Plan Note (Signed)
 History of CHF.  Blood pressure controlled.  Lungs are clear.  Continue carvedilol and amlodipine.  He is going return for yearly visit later in the summer.

## 2023-12-10 NOTE — Assessment & Plan Note (Signed)
 Continue CPAP as is.  Not sedated.  Compliant and doing well.  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  He had paperwork regarding CPAP supplies.  I need his CPAP compliance report and his pressure setting to finish filling out his paperwork.  Discussed.  He can get me that information.

## 2023-12-11 ENCOUNTER — Encounter: Payer: Self-pay | Admitting: Family Medicine

## 2023-12-11 NOTE — Telephone Encounter (Signed)
 He said he believe the CPAP setting is 3 and he sent this as well

## 2023-12-13 ENCOUNTER — Telehealth: Payer: Self-pay | Admitting: Family Medicine

## 2023-12-13 NOTE — Telephone Encounter (Signed)
 Please talk to me about this patient.  I am combining the two MyChart messages into this phone note.  I need some extra information.  Is he able to generate a full report about his CPAP use?  I think that is what they were asking for.  I also think his CPAP setting of 3 would be atypically low.  It is usually higher than that.  I need to see about getting extra information regarding his equipment and use-please talk to me about that before calling the patient back.  Thanks.

## 2023-12-14 NOTE — Telephone Encounter (Signed)
 Noted. Thanks. Please let me know when it comes in.

## 2023-12-14 NOTE — Telephone Encounter (Signed)
 Reached out to compliance they will be sending report via fax now.

## 2023-12-15 NOTE — Telephone Encounter (Signed)
 Received and placed in your box for review.

## 2023-12-17 NOTE — Telephone Encounter (Signed)
 Please scan and send the compliance report.  Please update his pressure setting as 13 on the form.  Let me know if I need to work on the hard copies.  Thanks.

## 2023-12-19 NOTE — Telephone Encounter (Signed)
 I updated the form to show the pressure setting. But it needs your signature. Also I do not have the compliance report.

## 2023-12-20 ENCOUNTER — Encounter: Payer: Self-pay | Admitting: Family Medicine

## 2023-12-20 NOTE — Telephone Encounter (Signed)
 Do you still have a copy of the compliant report or can you instruct on how I can receive it. Thank you

## 2023-12-20 NOTE — Telephone Encounter (Signed)
 Please request another copy of the compliant report and let me know when that is in.  Thanks.

## 2023-12-21 ENCOUNTER — Other Ambulatory Visit: Payer: Self-pay | Admitting: Family Medicine

## 2023-12-21 NOTE — Telephone Encounter (Signed)
 I did request a compliance report. Once received I will place in your tray so that paperwork can be completed

## 2023-12-21 NOTE — Telephone Encounter (Signed)
 You will need to call the company that gives c pap and ask them to send to fax

## 2023-12-22 NOTE — Telephone Encounter (Signed)
 Noted. Thanks.

## 2023-12-23 ENCOUNTER — Other Ambulatory Visit: Payer: Self-pay | Admitting: Family Medicine

## 2023-12-24 ENCOUNTER — Other Ambulatory Visit: Payer: Self-pay | Admitting: Family Medicine

## 2023-12-24 MED ORDER — OXYCODONE-ACETAMINOPHEN 10-325 MG PO TABS: 1.0000 | ORAL_TABLET | Freq: Four times a day (QID) | ORAL | 0 refills | Status: DC | PRN

## 2023-12-24 MED ORDER — OXYCODONE-ACETAMINOPHEN 10-325 MG PO TABS
1.0000 | ORAL_TABLET | Freq: Four times a day (QID) | ORAL | 0 refills | Status: DC | PRN
Start: 2023-12-24 — End: 2024-03-20

## 2023-12-24 NOTE — Progress Notes (Signed)
 Rx sent x3 for oxycodone/tylenol.

## 2023-12-26 NOTE — Telephone Encounter (Signed)
 FYI: As of today I have not received the compliance report. I reached back out to [facility] to request

## 2023-12-27 NOTE — Telephone Encounter (Signed)
 Noted. Thanks.  I'll check the hard copy.

## 2023-12-27 NOTE — Telephone Encounter (Signed)
 All paperwork including compliance report placed in your tray

## 2023-12-30 DIAGNOSIS — F4322 Adjustment disorder with anxiety: Secondary | ICD-10-CM | POA: Diagnosis not present

## 2024-01-01 DIAGNOSIS — G4733 Obstructive sleep apnea (adult) (pediatric): Secondary | ICD-10-CM | POA: Diagnosis not present

## 2024-01-05 DIAGNOSIS — I509 Heart failure, unspecified: Secondary | ICD-10-CM | POA: Diagnosis not present

## 2024-01-13 DIAGNOSIS — F4322 Adjustment disorder with anxiety: Secondary | ICD-10-CM | POA: Diagnosis not present

## 2024-01-23 ENCOUNTER — Other Ambulatory Visit: Payer: Self-pay | Admitting: Family Medicine

## 2024-01-24 ENCOUNTER — Other Ambulatory Visit: Payer: Self-pay | Admitting: Family Medicine

## 2024-01-24 NOTE — Telephone Encounter (Signed)
 Please check with pharmacy.  He should have prev percocet rxs on file, with set of 3 sent in 12/2023.  If not, let me know.  Thanks.

## 2024-01-24 NOTE — Telephone Encounter (Signed)
 I spoke with the pharmacy and he does have a refill ready to be picked up. I did message the patient and advised.

## 2024-01-27 DIAGNOSIS — F4322 Adjustment disorder with anxiety: Secondary | ICD-10-CM | POA: Diagnosis not present

## 2024-02-04 DIAGNOSIS — I509 Heart failure, unspecified: Secondary | ICD-10-CM | POA: Diagnosis not present

## 2024-02-10 DIAGNOSIS — F4322 Adjustment disorder with anxiety: Secondary | ICD-10-CM | POA: Diagnosis not present

## 2024-02-21 ENCOUNTER — Telehealth: Payer: Self-pay

## 2024-02-21 NOTE — Telephone Encounter (Signed)
 Documentation has been placed in your tray

## 2024-02-21 NOTE — Telephone Encounter (Signed)
 I will work on [employer].  Thanks.

## 2024-02-21 NOTE — Telephone Encounter (Signed)
 Copied from CRM [id number]. Topic: Clinical - Prescription Issue >> Feb 21, 2024  1:19 PM Baldo Levan wrote: Reason for CRM: [employer] [facility] called in stating they received the prescription for the patient but they need the prescription signed and dated by the doctor. If this can be done and re faxed back to [phone].

## 2024-02-23 ENCOUNTER — Telehealth: Payer: Self-pay

## 2024-02-23 NOTE — Telephone Encounter (Signed)
 Copied from CRM [id number]. Topic: General - Other >> Feb 22, 2024  3:46 PM Marlan Silva wrote: Reason for CRM: Caller/Agency: Merie calling from [facility] Callback Number: [phone] (secure line) called in stating that she will be faxing over some forms for the patients primary Dr to fill out and fax back.

## 2024-02-23 NOTE — Telephone Encounter (Signed)
 Paper placed in your tray

## 2024-02-25 DIAGNOSIS — F4322 Adjustment disorder with anxiety: Secondary | ICD-10-CM | POA: Diagnosis not present

## 2024-02-25 NOTE — Telephone Encounter (Signed)
 I will work on [employer].  Thanks.

## 2024-03-06 DIAGNOSIS — I509 Heart failure, unspecified: Secondary | ICD-10-CM | POA: Diagnosis not present

## 2024-03-09 DIAGNOSIS — F4322 Adjustment disorder with anxiety: Secondary | ICD-10-CM | POA: Diagnosis not present

## 2024-03-16 DIAGNOSIS — F4322 Adjustment disorder with anxiety: Secondary | ICD-10-CM | POA: Diagnosis not present

## 2024-03-18 ENCOUNTER — Telehealth: Payer: Self-pay | Admitting: Family Medicine

## 2024-03-18 NOTE — Telephone Encounter (Unsigned)
 Copied from CRM [id number]. Topic: Clinical - Medication Refill >> Mar 18, 2024  3:56 PM Martinique E wrote: Medication: oxyCODONE -acetaminophen  (PERCOCET) 10-325 MG tablet   Has the patient contacted their pharmacy? Yes (Agent: If no, request that the patient contact the pharmacy for the refill. If patient does not wish to contact the pharmacy document the reason why and proceed with request.) (Agent: If yes, when and what did the pharmacy advise?)  This is the patient's preferred pharmacy:  [facility] [phone] [address], [place] - [address]  [place] [address] Phone: [phone] Fax: [phone]   Is this the correct pharmacy for this prescription? Yes If no, delete pharmacy and type the correct one.   Has the prescription been filled recently? No  Is the patient out of the medication? No, 4 days left.  Has the patient been seen for an appointment in the last year OR does the patient have an upcoming appointment? Yes  Can we respond through MyChart? Yes  Agent: Please be advised that Rx refills may take up to 3 business days. We ask that you follow-up with your pharmacy.

## 2024-03-20 MED ORDER — OXYCODONE-ACETAMINOPHEN 10-325 MG PO TABS: 1.0000 | ORAL_TABLET | Freq: Four times a day (QID) | ORAL | 0 refills | Status: DC | PRN

## 2024-03-20 NOTE — Telephone Encounter (Signed)
 Sent. Thanks.

## 2024-03-26 ENCOUNTER — Encounter: Payer: Self-pay | Admitting: Family Medicine

## 2024-03-26 ENCOUNTER — Ambulatory Visit: Payer: Medicare HMO | Admitting: Family Medicine

## 2024-03-26 ENCOUNTER — Ambulatory Visit (INDEPENDENT_AMBULATORY_CARE_PROVIDER_SITE_OTHER): Payer: Medicare HMO

## 2024-03-26 VITALS — Ht 74.0 in | Wt >= 6400 oz

## 2024-03-26 VITALS — BP 130/74 | HR 75 | Temp 98.2°F | Ht 74.0 in | Wt >= 6400 oz

## 2024-03-26 DIAGNOSIS — G894 Chronic pain syndrome: Secondary | ICD-10-CM

## 2024-03-26 DIAGNOSIS — Z5982 Transportation insecurity: Secondary | ICD-10-CM

## 2024-03-26 DIAGNOSIS — I10 Essential (primary) hypertension: Secondary | ICD-10-CM

## 2024-03-26 DIAGNOSIS — Z0000 Encounter for general adult medical examination without abnormal findings: Secondary | ICD-10-CM

## 2024-03-26 DIAGNOSIS — M109 Gout, unspecified: Secondary | ICD-10-CM | POA: Diagnosis not present

## 2024-03-26 DIAGNOSIS — Z742 Need for assistance at home and no other household member able to render care: Secondary | ICD-10-CM

## 2024-03-26 DIAGNOSIS — Z0100 Encounter for examination of eyes and vision without abnormal findings: Secondary | ICD-10-CM

## 2024-03-26 DIAGNOSIS — R739 Hyperglycemia, unspecified: Secondary | ICD-10-CM | POA: Diagnosis not present

## 2024-03-26 DIAGNOSIS — Z125 Encounter for screening for malignant neoplasm of prostate: Secondary | ICD-10-CM | POA: Diagnosis not present

## 2024-03-26 DIAGNOSIS — G4733 Obstructive sleep apnea (adult) (pediatric): Secondary | ICD-10-CM

## 2024-03-26 DIAGNOSIS — M160 Bilateral primary osteoarthritis of hip: Secondary | ICD-10-CM | POA: Diagnosis not present

## 2024-03-26 DIAGNOSIS — Z87891 Personal history of nicotine dependence: Secondary | ICD-10-CM | POA: Diagnosis not present

## 2024-03-26 DIAGNOSIS — Z7189 Other specified counseling: Secondary | ICD-10-CM

## 2024-03-26 LAB — COMPREHENSIVE METABOLIC PANEL WITH GFR
ALT: 12 U/L (ref 0–53)
AST: 15 U/L (ref 0–37)
Albumin: 4.2 g/dL (ref 3.5–5.2)
Alkaline Phosphatase: 79 U/L (ref 39–117)
BUN: 11 mg/dL (ref 6–23)
CO2: 32 meq/L (ref 19–32)
Calcium: 9 mg/dL (ref 8.4–10.5)
Chloride: 105 meq/L (ref 96–112)
Creatinine, Ser: 0.86 mg/dL (ref 0.40–1.50)
GFR: 99.55 mL/min (ref >60.00)
Glucose, Bld: 88 mg/dL (ref 70–99)
Potassium: 4.4 meq/L (ref 3.5–5.1)
Sodium: 143 meq/L (ref 135–145)
Total Bilirubin: 0.4 mg/dL (ref 0.2–1.2)
Total Protein: 7.2 g/dL (ref 6.0–8.3)

## 2024-03-26 LAB — CBC WITH DIFFERENTIAL/PLATELET
Basophils Absolute: 0.1 K/uL (ref 0.0–0.1)
Basophils Relative: 1.2 % (ref 0.0–3.0)
Eosinophils Absolute: 0.2 K/uL (ref 0.0–0.7)
Eosinophils Relative: 3 % (ref 0.0–5.0)
HCT: 40.5 % (ref 39.0–52.0)
Hemoglobin: 13.2 g/dL (ref 13.0–17.0)
Lymphocytes Relative: 33.1 % (ref 12.0–46.0)
Lymphs Abs: 1.7 K/uL (ref 0.7–4.0)
MCHC: 32.7 g/dL (ref 30.0–36.0)
MCV: 91.6 fl (ref 78.0–100.0)
Monocytes Absolute: 0.5 K/uL (ref 0.1–1.0)
Monocytes Relative: 9.5 % (ref 3.0–12.0)
Neutro Abs: 2.8 K/uL (ref 1.4–7.7)
Neutrophils Relative %: 53.2 % (ref 43.0–77.0)
Platelets: 213 K/uL (ref 150.0–400.0)
RBC: 4.42 Mil/uL (ref 4.22–5.81)
RDW: 14.2 % (ref 11.5–15.5)
WBC: 5.2 K/uL (ref 4.0–10.5)

## 2024-03-26 LAB — LIPID PANEL
Cholesterol: 147 mg/dL (ref 0–200)
HDL: 49.2 mg/dL (ref >39.00)
LDL Cholesterol: 70 mg/dL (ref 0–99)
NonHDL: 97.51
Total CHOL/HDL Ratio: 3
Triglycerides: 138 mg/dL (ref 0.0–149.0)
VLDL: 27.6 mg/dL (ref 0.0–40.0)

## 2024-03-26 LAB — MICROALBUMIN / CREATININE URINE RATIO
Creatinine,U: 123.9 mg/dL
Microalb Creat Ratio: 11.7 mg/g (ref 0.0–30.0)
Microalb, Ur: 1.5 mg/dL (ref 0.0–1.9)

## 2024-03-26 LAB — HEMOGLOBIN A1C: Hgb A1c MFr Bld: 5.4 % (ref 4.6–6.5)

## 2024-03-26 LAB — URIC ACID: Uric Acid, Serum: 6.4 mg/dL (ref 4.0–7.8)

## 2024-03-26 LAB — TSH: TSH: 1.43 u[IU]/mL (ref 0.35–5.50)

## 2024-03-26 LAB — VITAMIN B12: Vitamin B-12: 573 pg/mL (ref 211–911)

## 2024-03-26 LAB — PSA, MEDICARE: PSA: 0.41 ng/mL (ref 0.10–4.00)

## 2024-03-26 MED ORDER — ALLOPURINOL 100 MG PO TABS: 100.0000 mg | ORAL_TABLET | ORAL | Status: DC

## 2024-03-26 NOTE — Progress Notes (Cosign Needed Addendum)
 Subjective:   Michael Payne is a 53 y.o. who presents for a Medicare Wellness preventive visit.  As a reminder, Annual Wellness Visits don't include a physical exam, and some assessments may be limited, especially if this visit is performed virtually. We may recommend an in-person follow-up visit with your provider if needed.  Visit Complete: Virtual I connected with  Michael Payne on 03/26/24 by a audio enabled telemedicine application and verified that I am speaking with the correct person using two identifiers.  Patient Location: Home  Provider Location: Office/Clinic  I discussed the limitations of evaluation and management by telemedicine. The patient expressed understanding and agreed to proceed.  Vital Signs: Because this visit was a virtual/telehealth visit, some criteria may be missing or patient reported. Any vitals not documented were not able to be obtained and vitals that have been documented are patient reported.  VideoDeclined- This patient declined Librarian, academic. Therefore the visit was completed with audio only.  Persons Participating in Visit: Patient.  AWV Questionnaire: No: Patient Medicare AWV questionnaire was not completed prior to this visit.  Cardiac Risk Factors include: advanced age (>90men, >48 women);male gender;hypertension;obesity (BMI >30kg/m2)     Objective:    Today's Vitals   03/26/24 1411 03/26/24 1412  Weight: (!) 437 lb (198.2 kg)   Height: 6' 2 (1.88 m)   PainSc:  5    Body mass index is 56.11 kg/m.     03/26/2024    2:29 PM 03/21/2023    1:46 PM 06/20/2022   11:00 PM 06/09/2022    1:55 PM 05/17/2022    8:40 AM 05/31/2021    3:17 PM 04/06/2021   10:31 AM  Advanced Directives  Does Patient Have a Medical Advance Directive? Yes Yes   No No No  Type of Estate agent of [place];Living will Healthcare Power of [place];Living will       Does patient want to make changes to  medical advance directive?     No - Patient declined  No - Patient declined  Copy of Healthcare Power of Attorney in Chart? No - copy requested No - copy requested       Would patient like information on creating a medical advance directive?     No - Patient declined No - Patient declined      Information is confidential and restricted. Go to Review Flowsheets to unlock data.    Current Medications (verified) Outpatient Encounter Medications as of 03/26/2024  Medication Sig   [START ON 03/28/2024] allopurinol  (ZYLOPRIM ) 100 MG tablet Take 1 tablet (100 mg total) by mouth 2 (two) times a week.   amLODipine  (NORVASC ) 10 MG tablet TAKE 1 TABLET BY MOUTH EVERY DAY   carvedilol  (COREG ) 3.125 MG tablet TAKE 1 TABLET TWICE DAILY WITH MEALS   Multiple Vitamins-Minerals (MULTIVITAMIN WITH MINERALS) tablet Take 1 tablet by mouth daily.   naloxone  (NARCAN ) nasal spray 4 mg/0.1 mL Spray nostril if needed for opioid overuse- decreased consciousness, decreased respirations   oxyCODONE -acetaminophen  (PERCOCET) 10-325 MG tablet Take 1-1.5 tablets by mouth every 6 (six) hours as needed for pain (max 5 tabs per day.).   oxyCODONE -acetaminophen  (PERCOCET) 10-325 MG tablet Take 1-1.5 tablets by mouth every 6 (six) hours as needed for pain (max 5 tabs per day.).   oxyCODONE -acetaminophen  (PERCOCET) 10-325 MG tablet Take 1-1.5 tablets by mouth every 6 (six) hours as needed for pain (Fill on or after 30 days from rx. max 5 tabs  per day.).   vitamin C (ASCORBIC ACID) 500 MG tablet Take 500 mg by mouth daily.   [DISCONTINUED] allopurinol  (ZYLOPRIM ) 100 MG tablet TAKE 1 TABLET EVERY OTHER DAY   No facility-administered encounter medications on file as of 03/26/2024.    Allergies (verified) Lisinopril , Aspirin , and Lactose intolerance (gi)   History: Past Medical History:  Diagnosis Date   Anemia    borderline anemia, was instructed to eat more iron filled foods   Cardiac enlargement 06/03/2022   Noted on Xray    CHF (congestive heart failure) (HCC) 05/2021   GERD (gastroesophageal reflux disease)    lactose intolerance, patient denies symptoms of reflux   Hip pain    History of kidney stones    HTN (hypertension)    Morbid obesity (HCC)    Obesity    Pneumonia 05/2021   Pre-diabetes    Sleep apnea    Past Surgical History:  Procedure Laterality Date   FOOT SURGERY Right    KNEE ARTHROSCOPY WITH MEDIAL MENISECTOMY Left 12/16/2014   Procedure: KNEE ARTHROSCOPY WITH MEDIAL MENISECTOMY;  Surgeon: Evalene JONETTA Chancy, MD;  Location: [facility] OR;  Service: Orthopedics;  Laterality: Left;   UPPER GI ENDOSCOPY N/A 06/20/2022   Procedure: UPPER GI ENDOSCOPY;  Surgeon: Lyndel Deward PARAS, MD;  Location: [facility];  Service: General;  Laterality: N/A;   Family History  Problem Relation Age of Onset   Heart attack Mother 68   Lung cancer Father    Cancer Father    CAD Maternal Grandfather    Colon cancer Maternal Uncle    Prostate cancer Neg Hx    Social History   Socioeconomic History   Marital status: Legally Separated    Spouse name: Not on file   Number of children: 2   Years of education: Not on file   Highest education level: Not on file  Occupational History   Occupation: disabled  Tobacco Use   Smoking status: Former    Current packs/day: 0.00    Average packs/day: 2.0 packs/day for 22.0 years (44.0 ttl pk-yrs)    Types: Cigarettes    Start date: 02/23/1989    Quit date: 02/24/2011    Years since quitting: 13.0   Smokeless tobacco: Never  Vaping Use   Vaping status: Never Used  Substance and Sexual Activity   Alcohol use: Not Currently   Drug use: Not Currently    Types: Marijuana    Comment: former -  quit in 2012   Sexual activity: Yes    Partners: Female  Other Topics Concern   Not on file  Social History Narrative   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   Former Hydrologist.   Divorced.    Social Drivers of Corporate investment banker Strain: Low Risk   (03/26/2024)   Overall Financial Resource Strain (CARDIA)    Difficulty of Paying Living Expenses: Not hard at all  Food Insecurity: No Food Insecurity (03/26/2024)   Hunger Vital Sign    Worried About Running Out of Food in the Last Year: Never true    Ran Out of Food in the Last Year: Never true  Transportation Needs: No Transportation Needs (03/26/2024)   PRAPARE - Administrator, Civil Service (Medical): No    Lack of Transportation (Non-Medical): No  Physical Activity: Sufficiently Active (03/26/2024)   Exercise Vital Sign    Days of Exercise per Week: 5 days    Minutes of Exercise per Session: 50  min  Stress: No Stress Concern Present (03/26/2024)   [employer] of Occupational Health - Occupational Stress Questionnaire    Feeling of Stress: Not at all  Social Connections: Moderately Integrated (03/26/2024)   Social Connection and Isolation Panel    Frequency of Communication with Friends and Family: More than three times a week    Frequency of Social Gatherings with Friends and Family: More than three times a week    Attends Religious Services: More than 4 times per year    Active Member of [employer] or Organizations: Yes    Attends Engineer, structural: More than 4 times per year    Marital Status: Divorced    Tobacco Counseling Counseling given: Not Answered   Clinical Intake:  Pre-visit preparation completed: Yes  Pain : 0-10 Pain Score: 5  Pain Type: Chronic pain Pain Location: Knee (both shoulder, hips; movement helps some) Pain Descriptors / Indicators: Aching Pain Onset: Today Pain Frequency: Constant Pain Relieving Factors: medications, movement  Pain Relieving Factors: medications, movement  BMI - recorded: 56.11 Nutritional Status: BMI > 30  Obese Nutritional Risks: None Diabetes: No  Lab Results  Component Value Date   HGBA1C 5.4 01/26/2023   HGBA1C 5.3 06/03/2022   HGBA1C 5.9 (H) 06/01/2021     How often do you need to have  someone help you when you read instructions, pamphlets, or other written materials from your doctor or pharmacy?: 1 - Never  Interpreter Needed?: No  Comments: lives alone Information entered by :: B.Estreya Clay,LPN   Activities of Daily Living    Patient Care Team: Cleatus Arlyss RAMAN, MD as PCP - General (Family Medicine) Shellia Oh, MD (Inactive) as Consulting Physician (Pulmonary Disease) [name], Deward PARAS, MD as Consulting Physician (Surgery) Fate Morna SAILOR, [facility] (Inactive) as Pharmacist (Pharmacist)  I have updated your Care Teams any recent Medical Services you may have received from other providers in the past year.     Assessment:   This is a routine wellness examination for [place].  Hearing/Vision screen Hearing Screening - Comments:: Pt says his hearing is good Vision Screening - Comments:: Pt says his vision is good w/glasses No appt to eye md in 6 years Referral to [facility]    Goals Addressed               This Visit's Progress     Increase physical activity (pt-stated)   On track     Travel -not yet as he likes 03/26/24-go back to Gym again.      lose weight (pt-stated)   Not on track     03/26/24-Still wants to lose      Need help to get out of house and to Dr Elfredia (pt-stated)   Not on track     Timeframe:  Long-Range Goal Priority:  High Start Date:       01/25/2021                      Expected End Date:  06/18/2021                     Follow Up Date 06/10/2021    -continue with  virtual  counseling Expect EMS pickup for MD appointment on 05/28/21 (approved) - keep 90 percent of counseling appointments - schedule counseling appointment  -follow up with [facility] office re: staffing of hours, etc.  -follow up with Bariatric Coordinator for next steps  - begin a notebook of services  in my neighborhood or community - call 211 when I need some help - follow-up on any referrals for help I am given - think ahead to make sure my need does not become  an emergency - make a note about what I need to have by the phone or take with me, like an identification card or social security number have a back-up plan - have a back-up plan - make a list of family or friends that I can call -continue working with Home Health PT  Why is this important?   Knowing how and where to find help for yourself or family in your neighborhood and community is an important skill.  You will want to take some steps to learn how.    Notes:       Patient Stated   On track     03/26/24, I will maintain and continue medications as prescribed.        Depression Screen     03/26/2024    2:21 PM 03/26/2024   11:44 AM 03/21/2023    1:44 PM 01/26/2023    9:41 AM 11/22/2022   12:39 PM 05/17/2022    8:25 AM 05/15/2021   10:08 AM  PHQ 2/9 Scores  PHQ - 2 Score 0 0 0 0 0 0 0  PHQ- 9 Score  2 0 0 0  0    Fall Risk     03/26/2024    2:16 PM 03/26/2024   11:44 AM 03/21/2023    1:45 PM 01/26/2023    9:41 AM 11/22/2022   12:39 PM  Fall Risk   Falls in the past year? 0 0 0 0 Exclusion - non ambulatory  Number falls in past yr: 0 0 0 0   Injury with Fall? 0 0 0 0   Risk for fall due to : No Fall Risks No Fall Risks No Fall Risks No Fall Risks   Follow up Falls prevention discussed;Education provided Falls evaluation completed Falls prevention discussed Falls evaluation completed     MEDICARE RISK AT HOME:  Medicare Risk at Home Any stairs in or around the home?: Yes If so, are there any without handrails?: Yes Home free of loose throw rugs in walkways, pet beds, electrical cords, etc?: Yes Adequate lighting in your home to reduce risk of falls?: Yes Life alert?: No Use of a cane, walker or w/c?: Yes (rollater,cane) Grab bars in the bathroom?: No Shower chair or bench in shower?: No Elevated toilet seat or a handicapped toilet?: Yes  TIMED UP AND GO:  Was the test performed?  No  Cognitive Function: 6CIT completed    04/06/2021   10:38 AM 03/24/2020    8:22 AM  MMSE - Mini  Mental State Exam  Not completed: Refused Refused        03/26/2024    2:32 PM 03/21/2023    1:46 PM 05/17/2022    8:41 AM 05/15/2021   10:10 AM  6CIT Screen  What Year? 0 points 0 points 0 points 0 points  What month? 0 points 0 points 0 points 0 points  What time? 0 points 0 points 0 points 0 points  Count back from 20 0 points 0 points 0 points 0 points  Months in reverse 0 points 0 points 2 points 0 points  Repeat phrase 0 points 0 points 0 points 0 points  Total Score 0 points 0 points 2 points 0 points    Immunizations Immunization History  Administered Date(s) Administered  Dtap, Unspecified 03/06/1972, 03/20/1972, 05/29/1972, 11/12/1973, 01/31/1977   Measles 03/19/1972   Polio, Unspecified 03/06/1972, 03/20/1972, 05/29/1972, 11/12/1973, 01/31/1977   Rubella 03/19/1972   Tdap 02/03/2012    Screening Tests Health Maintenance  Topic Date Due   FOOT EXAM  Never done   Diabetic kidney evaluation - Urine ACR  Never done   Hepatitis C Screening  Never done   Colonoscopy  Never done   Lung Cancer Screening  05/31/2022   OPHTHALMOLOGY EXAM  11/23/2022   HEMOGLOBIN A1C  07/29/2023   Diabetic kidney evaluation - eGFR measurement  01/26/2024   DTaP/Tdap/Td (6 - Td or Tdap) 03/26/2025 (Originally 02/02/2022)   Pneumococcal Vaccine 36-62 Years old (1 of 2 - PCV) 03/26/2025 (Originally 09/20/1990)   Hepatitis B Vaccines (1 of 3 - 19+ 3-dose series) 03/26/2025 (Originally 09/20/1990)   Zoster Vaccines- Shingrix (1 of 2) 06/26/2025 (Originally 09/20/2021)   INFLUENZA VACCINE  04/19/2024   COLON CANCER SCREENING ANNUAL FOBT  09/05/2024   Medicare Annual Wellness (AWV)  03/26/2025   HIV Screening  Completed   HPV VACCINES  Aged Out   Meningococcal B Vaccine  Aged Out   COVID-19 Vaccine  Discontinued    Health Maintenance  Health Maintenance Due  Topic Date Due   FOOT EXAM  Never done   Diabetic kidney evaluation - Urine ACR  Never done   Hepatitis C Screening  Never done    Colonoscopy  Never done   Lung Cancer Screening  05/31/2022   OPHTHALMOLOGY EXAM  11/23/2022   HEMOGLOBIN A1C  07/29/2023   Diabetic kidney evaluation - eGFR measurement  01/26/2024   Health Maintenance Items Addressed: Referral sent to Optometry/Ophthalmology Labs drawn today after PCP visit (DM profile)  Additional Screening:  Vision Screening: Recommended annual ophthalmology exams for early detection of glaucoma and other disorders of the eye. Would you like a referral to an eye doctor? Yes    Dental Screening: Recommended annual dental exams for proper oral hygiene  Community Resource Referral / Chronic Care Management: CRR required this visit?  No   CCM required this visit?  No   Plan:    I have personally reviewed and noted the following in the patient's chart:   Medical and social history Use of alcohol, tobacco or illicit drugs  Current medications and supplements including opioid prescriptions. Patient is currently taking opioid prescriptions. Information provided to patient regarding non-opioid alternatives. Patient advised to discuss non-opioid treatment plan with their provider. Functional ability and status Nutritional status Physical activity Advanced directives List of other physicians Hospitalizations, surgeries, and ER visits in previous 12 months Vitals Screenings to include cognitive, depression, and falls Referrals and appointments  In addition, I have reviewed and discussed with patient certain preventive protocols, quality metrics, and best practice recommendations. A written personalized care plan for preventive services as well as general preventive health recommendations were provided to patient.   Michael LITTIE Saris, LPN   10/20/7972   After Visit Summary: (MyChart) Due to this being a telephonic visit, the after visit summary with patients personalized plan was offered to patient via MyChart   Notes: See routing comments

## 2024-03-26 NOTE — Patient Instructions (Signed)
 Go to the lab on the way out.   If you have mychart we'll likely use that to update you.    Take care.  Glad to see you. Tetanus and flu shot when possible.  Update me as needed.

## 2024-03-26 NOTE — Assessment & Plan Note (Signed)
 Cousin Janet Askwen designated if patient were incapacitated.

## 2024-03-26 NOTE — Progress Notes (Unsigned)
 Hypertension:    Using medication without problems or lightheadedness: yes Chest pain with exertion:no Edema:trace BLE edema. Short of breath: on with sig exertion.    On allopurinol  w/o gout sx.  Compliant.  Labs pending. Taking allopurinol  twice weekly.    Still using rollator.    D/w pt about prev echo.  There is moderate left ventricular hypertrophy.  Left ventricular diastolic  parameters were normal.   H/o hyperglycemia, not Dm2.  Recheck labs pending.  Chronic pain.  Still on oxycodone .  Variable pain.  More mobile now.  Still with shoulder, hip, and knee pain.  Going to the gym daily.  Going to [employer] mult times per week, water exercises.  He is able to use the bus, can cover 1/2 block to bus stop.  Not sedated on med.  It helps some, with reduction of pain.  UDS pending.   OSA. Still has CPAP.  Compliant. No change in pressures.  Mask is fitting.  Intentional weight loss. Prev max 625 lbs.    Tetanus 2013 PNA vaccine d/w pt  Flu d/w pt.  Shingles d/w pt.  Covid d/w pt.   Vaccines d/w pt.   IFOB neg 2024.   PSA pending 2025.  Cousin Janet Askwen designated if patient were incapacitated.    Smoking 1.5 PPD, quit 2006.  He quit almost 20 years ago.  I need to check on eligibility for that.    Meds, vitals, and allergies reviewed.   PMH and SH reviewed  ROS: Per HPI unless specifically indicated in ROS section   GEN: nad, alert and oriented HEENT: mucous membranes moist NECK: supple w/o LA CV: rrr. PULM: ctab, no inc wob ABD: soft, +bs EXT: trace BLE edema SKIN: well perfused.  R hip pain with flexion.

## 2024-03-26 NOTE — Patient Instructions (Addendum)
 [name] , Thank you for taking time out of your busy schedule to complete your Annual Wellness Visit with me. I enjoyed our conversation and look forward to speaking with you again next year. I, as well as your care team,  appreciate your ongoing commitment to your health goals. Please review the following plan we discussed and let me know if I can assist you in the future. Your Game plan/ To Do List    Referrals: If you haven't heard from the office you've been referred to, please reach out to them at the phone provided.  You have been referred to [facility] for a complete eye exam. If you haven't heard from them in a few days, please call them to schedule your appointment.  [facility] [address] [place] [address] Phone: [phone]   A referral to [facility] has been placed for care management and more access to community resources due to lack of transportation. For follow-up their number is [phone] Follow up Visits: Next Medicare AWV with our clinical staff: 03/27/25 @ 2:20pm   Have you seen your provider in the last 6 months (3 months if uncontrolled diabetes)? Yes Next Office Visit with your provider: not scheduled yet;due   Clinician Recommendations:  Aim for 30 minutes of exercise or brisk walking, 6-8 glasses of water, and 5 servings of fruits and vegetables each day.       This is a list of the screening recommended for you and due dates:  Health Maintenance  Topic Date Due   Complete foot exam   Never done   Yearly kidney health urinalysis for diabetes  Never done   Hepatitis C Screening  Never done   Colon Cancer Screening  Never done   Screening for Lung Cancer  05/31/2022   Eye exam for diabetics  11/23/2022   Hemoglobin A1C  07/29/2023   Yearly kidney function blood test for diabetes  01/26/2024   DTaP/Tdap/Td vaccine (6 - Td or Tdap) 03/26/2025*   Pneumococcal Vaccination (1 of 2 - PCV) 03/26/2025*   Hepatitis B Vaccine (1 of 3 - 19+ 3-dose series)  03/26/2025*   Zoster (Shingles) Vaccine (1 of 2) 06/26/2025*   Flu Shot  04/19/2024   Stool Blood Test  09/05/2024   Medicare Annual Wellness Visit  03/26/2025   HIV Screening  Completed   HPV Vaccine  Aged Out   Meningitis B Vaccine  Aged Out   COVID-19 Vaccine  Discontinued  *Topic was postponed. The date shown is not the original due date.    Advanced directives: (Copy Requested) Please bring a copy of your health care power of attorney and living will to the office to be added to your chart at your convenience. You can mail to [facility] [address] [place], [place] [address] or email to [email or web address] Advance Care Planning is important because it:  [x]  Makes sure you receive the medical care that is consistent with your values, goals, and preferences  [x]  It provides guidance to your family and loved ones and reduces their decisional burden about whether or not they are making the right decisions based on your wishes.  Follow the link provided in your after visit summary or read over the paperwork we have mailed to you to help you started getting your Advance Directives in place. If you need assistance in completing these, please reach out to us  so that we can help you!

## 2024-03-27 DIAGNOSIS — Z0000 Encounter for general adult medical examination without abnormal findings: Secondary | ICD-10-CM | POA: Insufficient documentation

## 2024-03-27 NOTE — Assessment & Plan Note (Signed)
 Continue amlodipine  and carvedilol .  Rationale for use discussed with patient.  See notes on labs.

## 2024-03-27 NOTE — Assessment & Plan Note (Signed)
 With chronic pain.  Not sedated.  Continue oxycodone  as is.  He is trying to be as mobile as possible.  Weight loss noted.  He is working on diet and exercise.  He is trying to get to the gym multiple times per week.  He has been able to walk half block to the bus stop which is a noted improvement.  UDS pending.

## 2024-03-27 NOTE — Assessment & Plan Note (Signed)
 Still using CPAP.  Compliant. No change in pressures.  Mask is fitting.  Intentional weight loss. Prev max 625 lbs.   continue as is with CPAP.

## 2024-03-27 NOTE — Assessment & Plan Note (Signed)
 Not on meds.  Not diabetic by last A1c.  See notes on labs.

## 2024-03-27 NOTE — Assessment & Plan Note (Signed)
 On allopurinol  w/o gout sx.  Compliant.  Labs pending. Taking allopurinol  twice weekly.

## 2024-03-27 NOTE — Assessment & Plan Note (Signed)
 Tetanus 2013 PNA vaccine d/w pt  Flu d/w pt.  Shingles d/w pt.  Covid d/w pt.   Vaccines d/w pt.   IFOB neg 2024.   PSA pending 2025.  Cousin Janet Askwen designated if patient were incapacitated.    Smoking 1.5 PPD, quit 2006.  He quit almost 20 years ago.  Discussed that I will check on eligibility for lung cancer screening, via referral.

## 2024-03-28 ENCOUNTER — Telehealth: Payer: Self-pay | Admitting: Family Medicine

## 2024-03-28 NOTE — Telephone Encounter (Signed)
 Spoke with patient and advised about message below. Patient verbalized understanding.

## 2024-03-28 NOTE — Telephone Encounter (Signed)
 Please update patient about lung cancer screening program eligibility.    Criteria for screening is up to 15 years as non-smoker so he would not qualify.   Thanks.

## 2024-03-29 LAB — DRUG MONITORING, PANEL 8 WITH CONFIRMATION, URINE
6 Acetylmorphine: NEGATIVE ng/mL (ref <10)
Alcohol Metabolites: NEGATIVE ng/mL (ref <500)
Amphetamines: NEGATIVE ng/mL (ref <500)
Benzodiazepines: NEGATIVE ng/mL (ref <100)
Buprenorphine, Urine: NEGATIVE ng/mL (ref <5)
Cocaine Metabolite: NEGATIVE ng/mL (ref <150)
Codeine: NEGATIVE ng/mL (ref <50)
Creatinine: 142.2 mg/dL (ref >=20.0)
Hydrocodone: NEGATIVE ng/mL (ref <50)
Hydromorphone: NEGATIVE ng/mL (ref <50)
MDMA: NEGATIVE ng/mL (ref <500)
Marijuana Metabolite: 63 ng/mL — ABNORMAL HIGH (ref <5)
Marijuana Metabolite: POSITIVE ng/mL — AB (ref <20)
Morphine: NEGATIVE ng/mL (ref <50)
Norhydrocodone: NEGATIVE ng/mL (ref <50)
Noroxycodone: 1675 ng/mL — ABNORMAL HIGH (ref <50)
Opiates: NEGATIVE ng/mL (ref <100)
Oxidant: NEGATIVE ug/mL (ref <200)
Oxycodone: 333 ng/mL — ABNORMAL HIGH (ref <50)
Oxycodone: POSITIVE ng/mL — AB (ref <100)
Oxymorphone: 1169 ng/mL — ABNORMAL HIGH (ref <50)
pH: 7.6 (ref 4.5–9.0)

## 2024-03-29 LAB — DM TEMPLATE

## 2024-03-30 DIAGNOSIS — F4322 Adjustment disorder with anxiety: Secondary | ICD-10-CM | POA: Diagnosis not present

## 2024-03-31 ENCOUNTER — Ambulatory Visit: Payer: Self-pay | Admitting: Family Medicine

## 2024-04-01 ENCOUNTER — Telehealth: Payer: Self-pay

## 2024-04-01 DIAGNOSIS — G4733 Obstructive sleep apnea (adult) (pediatric): Secondary | ICD-10-CM | POA: Diagnosis not present

## 2024-04-01 NOTE — Progress Notes (Signed)
 Complex Care Management Note Care Guide Note  04/01/2024 Name: Michael Payne MRN: [id number] DOB: Oct 26, 1970   Complex Care Management Outreach Attempts: An unsuccessful telephone outreach was attempted today to offer the patient information about available complex care management services.  Follow Up Plan:  No further outreach attempts will be made at this time. We have been unable to contact the patient to offer or enroll patient in complex care management services.  Encounter Outcome:  No Answer  Dreama [facility]  [facility] Health Care Management Assistant Direct Dial: [phone]  Fax: [phone]

## 2024-04-05 DIAGNOSIS — I509 Heart failure, unspecified: Secondary | ICD-10-CM | POA: Diagnosis not present

## 2024-04-10 NOTE — Progress Notes (Signed)
 Complex Care Management Note  Care Guide Note 04/10/2024 Name: Michael Payne MRN: [id number] DOB: Feb 05, 1971  Michael Payne is a 53 y.o. year old male who sees Cleatus Arlyss RAMAN, MD for primary care. I reached out to [employer] [facility] by phone today to offer complex care management services.  Michael Payne was given information about Complex Care Management services today including:   The Complex Care Management services include support from the care team which includes your Nurse Care Manager, Clinical Social Worker, or Pharmacist.  The Complex Care Management team is here to help remove barriers to the health concerns and goals most important to you. Complex Care Management services are voluntary, and the patient may decline or stop services at any time by request to their care team member.   Complex Care Management Consent Status: Patient agreed to services and verbal consent obtained.   Follow up plan:  Telephone appointment with complex care management team member scheduled for:  04/19/24 at 10:00 a.m.   Encounter Outcome:  Patient Scheduled  Dreama [facility]  [facility], [facility] Health Care Management Assistant Direct Dial: [phone]  Fax: [phone]

## 2024-04-13 DIAGNOSIS — F4322 Adjustment disorder with anxiety: Secondary | ICD-10-CM | POA: Diagnosis not present

## 2024-04-19 ENCOUNTER — Other Ambulatory Visit: Payer: Self-pay

## 2024-04-19 DIAGNOSIS — I5031 Acute diastolic (congestive) heart failure: Secondary | ICD-10-CM

## 2024-04-19 DIAGNOSIS — M62838 Other muscle spasm: Secondary | ICD-10-CM

## 2024-04-23 NOTE — Patient Instructions (Signed)
 Visit Information  Thank you for taking time to visit with me today. Please don't hesitate to contact me if I can be of assistance to you before our next scheduled appointment.  Our next appointment is by telephone on Friday, August 15th  at 10:30am with Nestora Duos, RNCM.   Please call the care guide team at [phone] if you need to cancel or reschedule your appointment.   Following is a copy of your care plan:   Goals Addressed             This Visit's Progress    [facility] RN Care Plan       Problems:  Chronic Disease Management support and education needs related to Health Maintenance  Goal: Over the next 2 months the Patient will demonstrate Improved health management independence as evidenced by making appointment with [facility] for Ophthalmology exam as recommended by PCP at [facility] 03/20/24         Interventions:   Health Maintenance Interventions: Patient interviewed about adult health maintenance status including  Regular eye checkups  Patient Self-Care Activities:  Call provider office for new concerns or questions  Make appointment with Octavia Slack for Ophthalmology exam as recommended by PCP at [facility].   Plan:  Telephone follow up appointment with care management team member scheduled for:  two weeks.           [facility]BCI RN Care Plan       Problems:  Care Coordination needs related to Transportation and personal care services  Goal: Over the next 30 days the Patient will work with community resource care guide to address needs related to Transportation as evidenced by patient and/or community resource care guide support    work with Child psychotherapist to address Lacks knowledge of community resource: needs personal care services reinstated,  related to the management of ADL/IADL's in the home,  as evidenced by review of electronic medical record and patient or social worker report      Interventions:   Evaluation of current treatment plan related to lack of  transportation,  self-management and patient's adherence to plan as established by provider. Discussed plans with patient for ongoing care management follow up and provided patient with direct contact information for care management team Care Guide referral for application for [employer] [place] Assessed social determinant of health barriers, discussed income: receives $1460/mo in SSI, $505/mo in food stamps since his dependent son lives with him.   Discussed possible change in health insurance is due to slight increase in monthly benefits.  SW consult performed for assessment.   Patient Self-Care Activities:  Complete application for [employer] [place], have PCP complete their portion, make note of contact info on the application to call for status.   Plan:  Telephone follow up appointment with care management team member scheduled for:  two weeks.              A reminder to ALL patients/family/friends, please call the [place]  National Suicide Prevention Lifeline: 361-475-0625 or TTY: 5613071599 TTY 817-540-2464) to talk to a trained counselor if you are experiencing a Mental Health or Behavioral Health Crisis or need someone to talk to.  Patient verbalizes understanding of instructions and care plan provided today and agrees to view in MyChart. Active MyChart status and patient understanding of how to access instructions and care plan via MyChart confirmed with patient.     Santana Stamp BSN, CCM [place]  [facility] Population Health RN Care Manager Direct Dial: [phone]  Fax: [phone]

## 2024-04-23 NOTE — Patient Outreach (Deleted)
 Complex Care Management   Visit Note  04/23/2024  Name:  Michael Payne MRN: [id number] DOB: [birth year]  Situation: Referral received for Complex Care Management related to needs more help in the home, transportation. I obtained verbal consent from Patient.  Visit completed with Mr. Matters  on the phone. Main concern today was getting personal care services started back up - states his insurance changed and he's not sure why.  He also needs application for [employer] [place] - he is moving and the bus stop is too far to walk with his rollator.    Background:   Past Medical History:  Diagnosis Date   Anemia    borderline anemia, was instructed to eat more iron filled foods   Cardiac enlargement 06/03/2022   Noted on Xray   CHF (congestive heart failure) (HCC) 05/2021   GERD (gastroesophageal reflux disease)    lactose intolerance, patient denies symptoms of reflux   Hip pain    History of kidney stones    HTN (hypertension)    Morbid obesity (HCC)    Obesity    Pneumonia 05/2021   Pre-diabetes    Sleep apnea     Assessment: Patient Reported Symptoms:  Cognitive Cognitive Status: No symptoms reported, Alert and oriented to person, place, and time, Insightful and able to interpret abstract concepts, Normal speech and language skills      Neurological Neurological Review of Symptoms: No symptoms reported    HEENT HEENT Symptoms Reported: No symptoms reported      Cardiovascular Cardiovascular Symptoms Reported: No symptoms reported Does patient have uncontrolled Hypertension?: No Cardiovascular Management Strategies: Medication therapy Cardiovascular Comment: Has some compression stockings.  Respiratory Respiratory Symptoms Reported: No symptoms reported    Endocrine Endocrine Symptoms Reported: No symptoms reported Is patient diabetic?: No    Gastrointestinal Gastrointestinal Symptoms Reported: No symptoms reported Additional Gastrointestinal Details: Has a BM every day.  he  changed his eating habits and this helps with his BM's.      Genitourinary Genitourinary Symptoms Reported: No symptoms reported    Integumentary Integumentary Symptoms Reported: No symptoms reported    Musculoskeletal Musculoskelatal Symptoms Reviewed: Unsteady gait Additional Musculoskeletal Details: Uses a rollator.   Falls in the past year?: No    Psychosocial Psychosocial Symptoms Reported: Other Other Psychosocial Conditions: States he speaks to a therapist every two weeks, he did not elaborate reason. PHQ9=0 on this contact.     Quality of Family Relationships: non-existent      04/19/2024    2:25 PM  Depression screen PHQ 2/9  Decreased Interest 0  Down, Depressed, Hopeless 0  PHQ - 2 Score 0    There were no vitals filed for this visit.  Medications Reviewed Today     Reviewed by Lucian Santana LABOR, RN (Registered Nurse) on 04/19/24 at 1415  Med List Status: <None>   Medication Order Taking? Sig Documenting Provider Last Dose Status Informant  allopurinol  (ZYLOPRIM ) 100 MG tablet [id number] Yes Take 1 tablet (100 mg total) by mouth 2 (two) times a week. Cleatus Arlyss RAMAN, MD  Active   amLODipine  (NORVASC ) 10 MG tablet [id number] Yes TAKE 1 TABLET BY MOUTH EVERY DAY Cleatus Arlyss RAMAN, MD  Active   carvedilol  (COREG ) 3.125 MG tablet [id number] Yes TAKE 1 TABLET TWICE DAILY WITH MEALS Cleatus Arlyss RAMAN, MD  Active   Multiple Vitamins-Minerals (MULTIVITAMIN WITH MINERALS) tablet [id number] Yes Take 1 tablet by mouth daily. [provider]  Active Self, Pharmacy Records  naloxone  (NARCAN ) nasal  spray 4 mg/0.1 mL [id number] Yes Spray nostril if needed for opioid overuse- decreased consciousness, decreased respirations Avram Barnie NOVAK, FNP  Active Self, Pharmacy Records  oxyCODONE -acetaminophen  (PERCOCET) 10-325 MG tablet [id number] Yes Take 1-1.5 tablets by mouth every 6 (six) hours as needed for pain (max 5 tabs per day.). Cleatus Arlyss RAMAN, MD  Active    oxyCODONE -acetaminophen  (PERCOCET) 10-325 MG tablet [id number]  Take 1-1.5 tablets by mouth every 6 (six) hours as needed for pain (max 5 tabs per day.). Cleatus Arlyss RAMAN, MD  Active   oxyCODONE -acetaminophen  (PERCOCET) 10-325 MG tablet [id number]  Take 1-1.5 tablets by mouth every 6 (six) hours as needed for pain (Fill on or after 30 days from rx. max 5 tabs per day.). Cleatus Arlyss RAMAN, MD  Active   vitamin C (ASCORBIC ACID) 500 MG tablet [id number] Yes Take 500 mg by mouth daily. [provider]  Active Self, Pharmacy Records            Recommendation:   Referral to: [facility] BSW for assistance with health insurance Referral to Care Guide for transportation application/[employer] [place]  Follow Up Plan:   Telephone follow-up two weeks.  Santana Stamp BSN, CCM [place]  [facility] Population Health RN Care Manager Direct Dial: [phone]  Fax: [phone]

## 2024-04-23 NOTE — Patient Outreach (Signed)
 Complex Care Management   Visit Note  04/23/2024  Name:  Michael Payne MRN: [id number] DOB: [birth year]  Situation: Referral received for Complex Care Management related to SDOH Barriers:  Transportation and needs more help in the home.  I obtained verbal consent from Patient.  Visit completed with Mr. Drees  on the phone. Main concern to day is getting back his personal care services (PCS), states someone changed his health insurance, he isn't sure why, lost his PCS. He is moving and needs transportation, his new apartment is too far to walk to bus with rollator.   Background:   Past Medical History:  Diagnosis Date   Anemia    borderline anemia, was instructed to eat more iron filled foods   Cardiac enlargement 06/03/2022   Noted on Xray   CHF (congestive heart failure) (HCC) 05/2021   GERD (gastroesophageal reflux disease)    lactose intolerance, patient denies symptoms of reflux   Hip pain    History of kidney stones    HTN (hypertension)    Morbid obesity (HCC)    Obesity    Pneumonia 05/2021   Pre-diabetes    Sleep apnea     Assessment: Patient Reported Symptoms:  Cognitive Cognitive Status: No symptoms reported, Alert and oriented to person, place, and time, Insightful and able to interpret abstract concepts, Normal speech and language skills      Neurological Neurological Review of Symptoms: No symptoms reported    HEENT HEENT Symptoms Reported: No symptoms reported      Cardiovascular Cardiovascular Symptoms Reported: No symptoms reported Does patient have uncontrolled Hypertension?: No Cardiovascular Management Strategies: Medication therapy Cardiovascular Comment: Has some compression stockings.  Respiratory Respiratory Symptoms Reported: No symptoms reported    Endocrine Endocrine Symptoms Reported: No symptoms reported Is patient diabetic?: No    Gastrointestinal Gastrointestinal Symptoms Reported: No symptoms reported Additional Gastrointestinal  Details: Has a BM every day.  he changed his eating habits and this helps with his BM's.      Genitourinary Genitourinary Symptoms Reported: No symptoms reported    Integumentary Integumentary Symptoms Reported: No symptoms reported    Musculoskeletal Musculoskelatal Symptoms Reviewed: Unsteady gait Additional Musculoskeletal Details: Uses a rollator.   Falls in the past year?: No    Psychosocial Psychosocial Symptoms Reported: Other Other Psychosocial Conditions: States he speaks to a therapist every two weeks, he did not elaborate reason. PHQ9=0 on this contact.     Quality of Family Relationships: non-existent      04/19/2024    2:25 PM  Depression screen PHQ 2/9  Decreased Interest 0  Down, Depressed, Hopeless 0  PHQ - 2 Score 0    There were no vitals filed for this visit.  Medications Reviewed Today     Reviewed by Lucian Santana LABOR, RN (Registered Nurse) on 04/19/24 at 1415  Med List Status: <None>   Medication Order Taking? Sig Documenting Provider Last Dose Status Informant  allopurinol  (ZYLOPRIM ) 100 MG tablet [id number] Yes Take 1 tablet (100 mg total) by mouth 2 (two) times a week. Cleatus Arlyss RAMAN, MD  Active   amLODipine  (NORVASC ) 10 MG tablet [id number] Yes TAKE 1 TABLET BY MOUTH EVERY DAY Cleatus Arlyss RAMAN, MD  Active   carvedilol  (COREG ) 3.125 MG tablet [id number] Yes TAKE 1 TABLET TWICE DAILY WITH MEALS Cleatus Arlyss RAMAN, MD  Active   Multiple Vitamins-Minerals (MULTIVITAMIN WITH MINERALS) tablet [id number] Yes Take 1 tablet by mouth daily. [provider]  Active Self, Pharmacy Records  naloxone  (  NARCAN ) nasal spray 4 mg/0.1 mL [id number] Yes Spray nostril if needed for opioid overuse- decreased consciousness, decreased respirations Avram Barnie NOVAK, FNP  Active Self, Pharmacy Records  oxyCODONE -acetaminophen  (PERCOCET) 10-325 MG tablet [id number] Yes Take 1-1.5 tablets by mouth every 6 (six) hours as needed for pain (max 5 tabs per day.). Cleatus Arlyss RAMAN,  MD  Active   oxyCODONE -acetaminophen  (PERCOCET) 10-325 MG tablet [id number]  Take 1-1.5 tablets by mouth every 6 (six) hours as needed for pain (max 5 tabs per day.). Cleatus Arlyss RAMAN, MD  Active   oxyCODONE -acetaminophen  (PERCOCET) 10-325 MG tablet [id number]  Take 1-1.5 tablets by mouth every 6 (six) hours as needed for pain (Fill on or after 30 days from rx. max 5 tabs per day.). Cleatus Arlyss RAMAN, MD  Active   vitamin C (ASCORBIC ACID) 500 MG tablet [id number] Yes Take 500 mg by mouth daily. [provider]  Active Self, Pharmacy Records            Recommendation:   Specialty provider follow-up : referral made by PCP to [facility] - patient to call eye care office if no call made to him in two weeks.  -Referral made to Care Guide for [employer] [place] application.  -Referral made to SW to discuss PCS  Follow Up Plan:   Telephone follow-up two weeks.  Santana Stamp BSN, CCM [place]  [facility] Population Health RN Care Manager Direct Dial: [phone]  Fax: [phone]

## 2024-04-24 ENCOUNTER — Telehealth: Payer: Self-pay | Admitting: *Deleted

## 2024-04-24 NOTE — Progress Notes (Signed)
 Complex Care Management Note Care Guide Note  04/24/2024 Name: Michael Payne MRN: [id number] DOB: May 26, 1971  Michael Payne is a 53 y.o. year old male who is a primary care patient of Cleatus Arlyss RAMAN, MD . The community resource team was consulted for assistance with Transportation Needs   SDOH screenings and interventions completed:  Yes  Social Drivers of Health From This Encounter   Transportation Needs: Unmet Transportation Needs (04/24/2024)   PRAPARE - Administrator, Civil Service (Medical): Yes    SDOH Interventions Today    Flowsheet Row Most Recent Value  SDOH Interventions   Transportation Interventions Community Resources Provided  [Social worker requested transportation Application for access [place]]     Care guide performed the following interventions: Mailed patient access [place] application .  Follow Up Plan:  No further follow up planned at this time. The patient has been provided with needed resources.  Encounter Outcome:  Patient Visit Completed Michael Payne  [facility] Health[facility] Health Care Guide  Direct Dial:[phone] [phone] Website: [email or web address]

## 2024-04-27 DIAGNOSIS — F4322 Adjustment disorder with anxiety: Secondary | ICD-10-CM | POA: Diagnosis not present

## 2024-04-30 ENCOUNTER — Telehealth: Payer: Self-pay

## 2024-04-30 NOTE — Progress Notes (Signed)
   Telephone encounter was:  Successful.  Complex Care Management Note Care Guide Note  04/30/2024 Name: Michael Payne MRN: [id number] DOB: 17-Mar-1971  [name] [name] is a 53 y.o. year old male who is a primary care patient of Cleatus Arlyss RAMAN, MD . The community resource team was consulted for assistance with In home care   SDOH screenings and interventions completed:  No        Care guide performed the following interventions: Patient provided with information about care guide support team and interviewed to confirm resource needs.Pt had questions about getting in home care Pt will be following up with PCP and new insurance coverage   Follow Up Plan:  No further follow up planned at this time. The patient has been provided with needed resources.  Encounter Outcome:  Patient Visit Completed   [facility]  [facility] Guide, Phone: [phone] Fax: [phone] Website: [email or web address]

## 2024-05-03 ENCOUNTER — Telehealth: Payer: Self-pay

## 2024-05-03 NOTE — Patient Instructions (Signed)
 Michael Payne - I am sorry I was unable to reach you today for our scheduled appointment. I work with Cleatus Arlyss RAMAN, MD and am calling to support your healthcare needs. Please contact me at [phone] at your earliest convenience. I look forward to speaking with you soon.   Thank you,  Nestora Duos, MSN, RN [facility]  [facility], [facility] RN Care Manager Direct Dial: [phone] Fax: [phone]

## 2024-05-06 DIAGNOSIS — I509 Heart failure, unspecified: Secondary | ICD-10-CM | POA: Diagnosis not present

## 2024-05-11 DIAGNOSIS — F4322 Adjustment disorder with anxiety: Secondary | ICD-10-CM | POA: Diagnosis not present

## 2024-05-15 ENCOUNTER — Other Ambulatory Visit: Payer: Self-pay | Admitting: Family Medicine

## 2024-05-15 DIAGNOSIS — I10 Essential (primary) hypertension: Secondary | ICD-10-CM

## 2024-05-29 ENCOUNTER — Telehealth: Payer: Self-pay

## 2024-05-29 NOTE — Telephone Encounter (Signed)
 Copied from CRM [id number]. Topic: Clinical - Order For Equipment >> May 29, 2024  1:17 PM Aleatha C wrote: Reason for CRM: Patient would like to request for a  Bariatric Rollator

## 2024-05-29 NOTE — Telephone Encounter (Signed)
 Please give the order.  Thanks.  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

## 2024-05-31 ENCOUNTER — Other Ambulatory Visit: Payer: Self-pay

## 2024-05-31 DIAGNOSIS — M1990 Unspecified osteoarthritis, unspecified site: Secondary | ICD-10-CM

## 2024-05-31 NOTE — Telephone Encounter (Signed)
 FYI   I did place the order for the rollator. I was told that This patient received a [place] walker 01/05/2024. insurance will not cover another ambulatory aid at this time.

## 2024-06-01 DIAGNOSIS — F4322 Adjustment disorder with anxiety: Secondary | ICD-10-CM | POA: Diagnosis not present

## 2024-06-02 NOTE — Telephone Encounter (Signed)
Noted, please notify pt  Thanks

## 2024-06-06 DIAGNOSIS — I509 Heart failure, unspecified: Secondary | ICD-10-CM | POA: Diagnosis not present

## 2024-06-08 DIAGNOSIS — F4322 Adjustment disorder with anxiety: Secondary | ICD-10-CM | POA: Diagnosis not present

## 2024-06-21 ENCOUNTER — Other Ambulatory Visit: Payer: Self-pay | Admitting: Family Medicine

## 2024-06-21 NOTE — Telephone Encounter (Signed)
 Copied from CRM [id number]. Topic: Clinical - Medication Refill >> Jun 21, 2024  3:36 PM Berneda F wrote: Medication: oxyCODONE -acetaminophen  (PERCOCET) 10-325 MG tablet  Has the patient contacted their pharmacy? No, controlled substance (Agent: If no, request that the patient contact the pharmacy for the refill. If patient does not wish to contact the pharmacy document the reason why and proceed with request.) (Agent: If yes, when and what did the pharmacy advise?)  This is the patient's preferred pharmacy:  [phone], [place] - [address] [place] [address] Phone: [phone] Fax: [phone]  Is this the correct pharmacy for this prescription? Yes If no, delete pharmacy and type the correct one.   Has the prescription been filled recently? No  Is the patient out of the medication? No  Has the patient been seen for an appointment in the last year OR does the patient have an upcoming appointment? Yes  Can we respond through MyChart? Yes  Agent: Please be advised that Rx refills may take up to 3 business days. We ask that you follow-up with your pharmacy.

## 2024-06-22 DIAGNOSIS — F4322 Adjustment disorder with anxiety: Secondary | ICD-10-CM | POA: Diagnosis not present

## 2024-06-23 MED ORDER — OXYCODONE-ACETAMINOPHEN 10-325 MG PO TABS: 1.0000 | ORAL_TABLET | Freq: Four times a day (QID) | ORAL | 0 refills | Status: DC | PRN

## 2024-06-23 NOTE — Telephone Encounter (Signed)
 Sent. Thanks.

## 2024-06-26 ENCOUNTER — Telehealth: Payer: Self-pay

## 2024-06-26 NOTE — Patient Outreach (Signed)
 Complex Care Management Care Guide Note  06/26/2024 Name: Michael Payne MRN: [id number] DOB: 1970/12/09  Michael Payne is a 53 y.o. year old male who is a primary care patient of Cleatus Arlyss RAMAN, MD and is actively engaged with the care management team. I reached out to Michael Payne by phone today to assist with re-scheduling  with the RN Case Manager.  Follow up plan: Unsuccessful telephone outreach attempt made. A HIPAA compliant phone message was left for the patient providing contact information and requesting a return call.  Shereen Gin [facility]  Population Health VBCI Assistant Direct Dial: [phone]  Fax: [phone] Website: [email or web address]

## 2024-06-27 ENCOUNTER — Telehealth: Payer: Self-pay

## 2024-06-27 NOTE — Telephone Encounter (Signed)
 Spoke with patient to advise that the [facility] [place] paperwork is complete and I will place in mail

## 2024-07-06 DIAGNOSIS — F4322 Adjustment disorder with anxiety: Secondary | ICD-10-CM | POA: Diagnosis not present

## 2024-07-09 ENCOUNTER — Telehealth: Payer: Self-pay

## 2024-07-09 NOTE — Patient Outreach (Signed)
 Complex Care Management Care Guide Note  07/09/2024 Name: ASKARI KINLEY II MRN: [id number] DOB: [birth year]  [name] Florence II is a 53 y.o. year old male who is a primary care patient of Cleatus Arlyss RAMAN, MD and is actively engaged with the care management team. I reached out to [name] Florence II by phone today to assist with re-scheduling  with the RN Case Manager.  Follow up plan: Telephone appointment with complex care management team member scheduled for:  07/25/24  Shereen Gin [facility]  Population Health VBCI Assistant Direct Dial: [phone]  Fax: [phone] Website: [email or web address]

## 2024-07-18 ENCOUNTER — Encounter: Payer: Self-pay | Admitting: Family Medicine

## 2024-07-18 ENCOUNTER — Ambulatory Visit
Admission: RE | Admit: 2024-07-18 | Discharge: 2024-07-18 | Disposition: A | Source: Ambulatory Visit | Attending: Family Medicine | Admitting: Family Medicine

## 2024-07-18 ENCOUNTER — Other Ambulatory Visit: Payer: Self-pay | Admitting: Family Medicine

## 2024-07-18 ENCOUNTER — Ambulatory Visit (INDEPENDENT_AMBULATORY_CARE_PROVIDER_SITE_OTHER): Admitting: Family Medicine

## 2024-07-18 VITALS — BP 132/98 | HR 71 | Temp 98.5°F | Ht 74.0 in | Wt >= 6400 oz

## 2024-07-18 DIAGNOSIS — K429 Umbilical hernia without obstruction or gangrene: Secondary | ICD-10-CM | POA: Diagnosis not present

## 2024-07-18 DIAGNOSIS — M25552 Pain in left hip: Secondary | ICD-10-CM

## 2024-07-18 DIAGNOSIS — I10 Essential (primary) hypertension: Secondary | ICD-10-CM

## 2024-07-18 MED ORDER — AMLODIPINE BESYLATE 10 MG PO TABS: 10.0000 mg | ORAL_TABLET | Freq: Every day | ORAL | 1 refills | Status: AC

## 2024-07-18 NOTE — Patient Instructions (Addendum)
 Ice your hip for 5 minutes at a time.  Xray on the way out.  Take care.  Glad to see you. Let me know if you can't get an appointment with the general surgery.  If you have severe pain then go to the ER.

## 2024-07-18 NOTE — Progress Notes (Unsigned)
 Soft hernia the umbilicus.  Present for long time per patient report but more symptomatic now. Local discomfort.  No fevers.  Still moving his bowels.    Leg pain.  Noted more with weather change recently.  More pain walking.  Still taking oxycodone  at baseline.  More pain with exercise recently.  L hip pain.  Pain laying down, pain rolling over.    Meds, vitals, and allergies reviewed.   ROS: Per HPI unless specifically indicated in ROS section   Nad Ncat Using rollator at baseline.  Rrr Ctab Abd soft, not ttp Soft umbilical hernia.   L greater troch area ttp.  He doesn't have the pain at the greater troch with int rotation.  Able to bear weight.

## 2024-07-19 ENCOUNTER — Telehealth: Payer: Self-pay | Admitting: Family Medicine

## 2024-07-19 DIAGNOSIS — K429 Umbilical hernia without obstruction or gangrene: Secondary | ICD-10-CM | POA: Insufficient documentation

## 2024-07-19 DIAGNOSIS — M25552 Pain in left hip: Secondary | ICD-10-CM | POA: Insufficient documentation

## 2024-07-19 NOTE — Assessment & Plan Note (Signed)
 No emergent sx.  Refer to general surgery.

## 2024-07-19 NOTE — Telephone Encounter (Signed)
 Please update patient.  We attempted xrays here at clinic but couldn't get useful images.  The order was cancelled.  I need him to try to get imaging done at [facility] imaging.  I put in new orders for that.  Thanks.

## 2024-07-19 NOTE — Assessment & Plan Note (Signed)
 Anatomy d/w pt.  Concern for trochanteric bursitis.  Ice the sore area for 5 minutes at a time.  Attempted xrays today but they were not useful given his habitus.   See following phone note.

## 2024-07-20 DIAGNOSIS — F4322 Adjustment disorder with anxiety: Secondary | ICD-10-CM | POA: Diagnosis not present

## 2024-07-22 NOTE — Telephone Encounter (Signed)
 Patient was advised of this note. Patient was informed he should be able to walk in to get x-rays done and her verbalized understanding.

## 2024-07-24 ENCOUNTER — Encounter: Payer: Self-pay | Admitting: *Deleted

## 2024-07-25 ENCOUNTER — Telehealth: Payer: Self-pay

## 2024-07-25 NOTE — Patient Instructions (Signed)
 Marsha CROME Kiehn II - I am sorry I was unable to reach you today for our scheduled appointment. I work with Cleatus Arlyss RAMAN, MD and am calling to support your healthcare needs. Please contact me at [phone] at your earliest convenience. I look forward to speaking with you soon.   Thank you,  Nestora Duos, MSN, RN [facility]  [facility], [facility] RN Care Manager Direct Dial: [phone] Fax: [phone]

## 2024-07-31 LAB — OPHTHALMOLOGY REPORT-SCANNED

## 2024-08-02 NOTE — Patient Outreach (Signed)
 RNCM spoke with patient to schedule missed appointment and determine focus, not seen since August 2025. Patient scheduled as initial visit for focus on pain and ambulation.

## 2024-08-03 DIAGNOSIS — F4322 Adjustment disorder with anxiety: Secondary | ICD-10-CM | POA: Diagnosis not present

## 2024-08-06 DIAGNOSIS — Z5948 Other specified lack of adequate food: Secondary | ICD-10-CM | POA: Diagnosis not present

## 2024-08-06 DIAGNOSIS — K219 Gastro-esophageal reflux disease without esophagitis: Secondary | ICD-10-CM | POA: Diagnosis not present

## 2024-08-06 DIAGNOSIS — E785 Hyperlipidemia, unspecified: Secondary | ICD-10-CM | POA: Diagnosis not present

## 2024-08-06 DIAGNOSIS — F4320 Adjustment disorder, unspecified: Secondary | ICD-10-CM | POA: Diagnosis not present

## 2024-08-06 DIAGNOSIS — G4733 Obstructive sleep apnea (adult) (pediatric): Secondary | ICD-10-CM | POA: Diagnosis not present

## 2024-08-06 DIAGNOSIS — F325 Major depressive disorder, single episode, in full remission: Secondary | ICD-10-CM | POA: Diagnosis not present

## 2024-08-06 DIAGNOSIS — Z59869 Financial insecurity, unspecified: Secondary | ICD-10-CM | POA: Diagnosis not present

## 2024-08-06 DIAGNOSIS — M109 Gout, unspecified: Secondary | ICD-10-CM | POA: Diagnosis not present

## 2024-08-06 DIAGNOSIS — Z5982 Transportation insecurity: Secondary | ICD-10-CM | POA: Diagnosis not present

## 2024-08-06 DIAGNOSIS — I509 Heart failure, unspecified: Secondary | ICD-10-CM | POA: Diagnosis not present

## 2024-08-06 DIAGNOSIS — M1990 Unspecified osteoarthritis, unspecified site: Secondary | ICD-10-CM | POA: Diagnosis not present

## 2024-08-06 DIAGNOSIS — Z888 Allergy status to other drugs, medicaments and biological substances status: Secondary | ICD-10-CM | POA: Diagnosis not present

## 2024-08-06 DIAGNOSIS — F411 Generalized anxiety disorder: Secondary | ICD-10-CM | POA: Diagnosis not present

## 2024-08-06 DIAGNOSIS — Z6841 Body Mass Index (BMI) 40.0 and over, adult: Secondary | ICD-10-CM | POA: Diagnosis not present

## 2024-08-06 DIAGNOSIS — E1165 Type 2 diabetes mellitus with hyperglycemia: Secondary | ICD-10-CM | POA: Diagnosis not present

## 2024-08-06 DIAGNOSIS — Z9181 History of falling: Secondary | ICD-10-CM | POA: Diagnosis not present

## 2024-08-06 DIAGNOSIS — Z882 Allergy status to sulfonamides status: Secondary | ICD-10-CM | POA: Diagnosis not present

## 2024-08-06 DIAGNOSIS — Z8249 Family history of ischemic heart disease and other diseases of the circulatory system: Secondary | ICD-10-CM | POA: Diagnosis not present

## 2024-08-06 DIAGNOSIS — I110 Hypertensive heart disease with heart failure: Secondary | ICD-10-CM | POA: Diagnosis not present

## 2024-08-06 DIAGNOSIS — Z91011 Allergy to milk products, unspecified: Secondary | ICD-10-CM | POA: Diagnosis not present

## 2024-08-21 ENCOUNTER — Other Ambulatory Visit: Payer: Self-pay

## 2024-08-21 NOTE — Progress Notes (Signed)
 Patient appearing on report for [employer] - Hypertension Control report due to last documented ambulatory blood pressure of 132/98 on 07/18/2024. Next appointment with PCP is NEEDS SCHEDULED (patient to call and schedule)  Outreached patient to discuss hypertension control and medication management.   Current antihypertensives:  Amlodipine  10 mg daily Carvedilol  3.215 mg twice daily  Patient has an automated upper arm home BP machine. He attempted to use his blood pressure monitor today, but the cuff was out of batteries. He plans on checking his blood pressure later today and following up with a reading via MyChart. Reports adherence with all medications.   Current blood pressure readings: N/A  Current meal patterns: tries to minimize salt intake, only drinks water Current physical activity: uses a walker, but goes to the [employer] fives day a week  Patient denies hypotensive signs and symptoms including dizziness, lightheadedness.  Patient denies hypertensive symptoms including headache, chest pain, shortness of breath.   Assessment/Plan: Currently uncontrolled Reviewed goal blood pressure <130/80 Reviewed appropriate home BP monitoring technique (avoid caffeine, smoking, and exercise for 30 minutes before checking, rest for at least 5 minutes before taking BP, sit with feet flat on the floor and back against a hard surface, uncross legs, and rest arm on flat surface) Reviewed to check blood pressure weekly, document, and provide at next provider visit Discussed dietary modifications, such as reduced salt intake, focus on whole grains, vegetables, lean proteins Discussed goal of 150 minutes of moderate intensity physical activity weekly Recommend continue taking medications as prescribed. Patient to call and schedule next appointment  MyChart message sent for follow up readings  Woodie Jock, PharmD PGY1 Pharmacy Resident  08/21/2024

## 2024-08-22 ENCOUNTER — Other Ambulatory Visit: Payer: Self-pay

## 2024-08-22 DIAGNOSIS — Z5982 Transportation insecurity: Secondary | ICD-10-CM

## 2024-08-22 NOTE — Patient Outreach (Signed)
 RNCM - received as transfer from another CM, patient not seen since 04/19/24. Patient declining need for [facility] services at this time. PCP notified. Patient scheduled with BSW for assist with transportation as requested - also advised to call [facility] after patient stated switched back to plan that provided transportation. Discussed Find Help and 211. Discussed eye exam referral - patient will follow up and schedule. RNCM provided contact information if patient would like to resume services.

## 2024-08-22 NOTE — Addendum Note (Signed)
 Addended by: Bradley Bostelman on: 08/22/2024 02:47 PM   Modules accepted: Orders

## 2024-08-22 NOTE — Patient Instructions (Signed)
 Visit Information  Thank you for taking time to visit with me today. Please don't hesitate to contact me if I can be of assistance to you before our next scheduled appointment.  Your next care management appointment is no further scheduled appointments with RN Care Manager as requested.   Appointment with BSW regarding transportation 08/26/2024 at 3pm via phone.   Telephone follow-up with BSW  Please call the care guide team at [phone] if you need to cancel, schedule, or reschedule an appointment.   Please call the Suicide and Crisis Lifeline: 988 call the [place]  National Suicide Prevention Lifeline: 571-087-4185 or TTY: 413-154-7139 TTY 6464757316) to talk to a trained counselor call 1-800-273-TALK (toll free, 24 hour hotline) go to [facility] [address], [place] ([phone]) call 911 if you are experiencing a Mental Health or Behavioral Health Crisis or need someone to talk to.  Nestora Duos, MSN, RN [facility], [facility] RN Care Manager Direct Dial: [phone] Fax: [phone]

## 2024-08-23 ENCOUNTER — Telehealth: Payer: Self-pay

## 2024-08-23 NOTE — Progress Notes (Signed)
 Complex Care Management Note Care Guide Note  08/23/2024 Name: Michael Payne MRN: [id number] DOB: 12-22-1970  [name] [name] is a 53 y.o. year old male who is a primary care patient of Cleatus, Arlyss RAMAN, MD . The community resource team was consulted for assistance with Transportation Needs  and Food Insecurity  SDOH screenings and interventions completed:  Yes  Social Drivers of Health From This Encounter   Food Insecurity: No Food Insecurity (08/23/2024)   Hunger Vital Sign    Worried About Running Out of Food in the Last Year: Never true    Ran Out of Food in the Last Year: Never true  Housing: Low Risk  (08/23/2024)   Housing Stability Vital Sign    Unable to Pay for Housing in the Last Year: No    Number of Times Moved in the Last Year: 0    Homeless in the Last Year: No  Financial Resource Strain: Low Risk  (08/23/2024)   Overall Financial Resource Strain (CARDIA)    Difficulty of Paying Living Expenses: Not hard at all  Transportation Needs: Unmet Transportation Needs (08/23/2024)   PRAPARE - Administrator, Civil Service (Medical): Yes    Lack of Transportation (Non-Medical): No  Utilities: Not At Risk (08/23/2024)   Utilities    Threatened with loss of utilities: No    SDOH Interventions Today    Flowsheet Row Most Recent Value  SDOH Interventions   Food Insecurity Interventions Other (Comment)  [Emailed [employer]er, [employer] to [email or web address] per patient request.]  Transportation Interventions Other (Comment)  [Completed online [place] [employer] application with patient. Emailed Part B application to PCP to be completed and faxed to Access. Emailed Access brochure.]     Care guide performed the following interventions: Patient provided with information about care guide support team and interviewed to confirm resource needs.  Follow Up Plan:  Care guide will follow up with patient by phone over the next 7 days.  Encounter Outcome:  Patient Visit  Completed  Hideko Esselman [facility]  [facility] Guide Direct Dial: [phone]  Fax: [phone] Website: [email or web address]

## 2024-08-26 ENCOUNTER — Other Ambulatory Visit: Payer: Self-pay

## 2024-08-26 NOTE — Patient Instructions (Signed)
 Visit Information  Thank you for taking time to visit with me today. Please don't hesitate to contact me if I can be of assistance to you before our next scheduled appointment.  Our next appointment is by telephone on 09/04/24 at 130 Please call the care guide team at [phone] if you need to cancel or reschedule your appointment.   Following is a copy of your care plan:   Goals Addressed             This Visit's Progress    BSW Goals       .Current SDOH Barriers:  Transportation utilities  Interventions: SW provided patient with resources for utilities via email on file.  Patient has already received resources for food pantries. Part B of [place] application has been sent to PCP for completion.  Thersia Hoar, BSW, [facility] [place]  [facility] Social Worker, Population Health [phone]           Please call the Suicide and Crisis Lifeline: 988 call the [place]  National Suicide Prevention Lifeline: (613)340-8383 or TTY: 828-883-9125 TTY 778 194 9594) to talk to a trained counselor call 1-800-273-TALK (toll free, 24 hour hotline) go to [facility] [address] [place] [phone]) call 911 if you are experiencing a Mental Health or Behavioral Health Crisis or need someone to talk to.  Patient verbalized understanding of Care plan and visit instructions communicated this visit  Thersia Hoar, BSW, [facility] [place]  [facility] Social Worker, Population Health [phone]

## 2024-08-26 NOTE — Patient Outreach (Signed)
 Social Drivers of Health  Community Resource and Care Coordination Visit Note   08/26/2024  Name: Michael Payne MRN: [id number] DOB:28-Apr-1971  Situation: Referral received for [facility] needs assessment and assistance related to Transportation Food Insecurity  Utitilties. I obtained verbal consent from Patient.  Visit completed with Patient on the phone.   Background:      Assessment:   Goals Addressed             This Visit's Progress    BSW Goals       .Current SDOH Barriers:  Transportation utilities  Interventions: SW provided patient with resources for utilities via email on file.  Patient has already received resources for food pantries. Part B of [place] application has been sent to PCP for completion.  Thersia Hoar, HEDWIG, MHA [place]  [facility] Social Worker, Population Health [phone]           Recommendation:   Wait for a call from [employer] by the end of the week. Use utility and food resources provided.  Follow Up Plan:   Telephone follow-up 09/04/24 at 130  Thersia Hoar, [place], [place] [facility]  Value Based [facility] Social Worker, Population Health [phone]

## 2024-08-30 ENCOUNTER — Telehealth: Payer: Self-pay

## 2024-08-30 NOTE — Progress Notes (Signed)
 Complex Care Management Note Care Guide Note  08/30/2024 Name: Michael Payne MRN: [id number] DOB: 1970-11-25  Michael Payne is a 53 y.o. year old male who is a primary care patient of Cleatus, Arlyss RAMAN, MD . The community resource team was consulted for assistance with Transportation Needs   SDOH screenings and interventions completed:  Yes  Social Drivers of Health From This Encounter   Food Insecurity: No Food Insecurity (08/30/2024)   Epic    Worried About Running Out of Food in the Last Year: Never true    Ran Out of Food in the Last Year: Never true  Housing: Low Risk (08/30/2024)   Epic    Unable to Pay for Housing in the Last Year: No    Number of Times Moved in the Last Year: 0    Homeless in the Last Year: No  Financial Resource Strain: Low Risk (08/30/2024)   Overall Financial Resource Strain (CARDIA)    Difficulty of Paying Living Expenses: Not very hard  Utilities: Not At Risk (08/30/2024)   Epic    Threatened with loss of utilities: No    SDOH Interventions Today    Flowsheet Row Most Recent Value  SDOH Interventions   Transportation Interventions Other (Comment)  [Patient stated he has received a letter from [employer] [place] and they are waiting for some additional information from him and the completed Part B application. Patient has the number for Access if he has any further questions.]     Care guide performed the following interventions: Patient provided with information about care guide support team and interviewed to confirm resource needs.  Follow Up Plan:  No further follow up planned at this time. The patient has been provided with needed resources.  Encounter Outcome:  Patient Visit Completed  Kaisy Severino [facility]  [facility] Guide Direct Dial: [phone]  Fax: [phone] Website: [email or web address]

## 2024-09-04 ENCOUNTER — Other Ambulatory Visit: Payer: Self-pay

## 2024-09-04 ENCOUNTER — Other Ambulatory Visit: Payer: Self-pay | Admitting: Family Medicine

## 2024-09-04 DIAGNOSIS — I10 Essential (primary) hypertension: Secondary | ICD-10-CM

## 2024-09-04 DIAGNOSIS — Z01818 Encounter for other preprocedural examination: Secondary | ICD-10-CM

## 2024-09-04 NOTE — Patient Instructions (Signed)
 Visit Information  Thank you for taking time to visit with me today. Please don't hesitate to contact me if I can be of assistance to you before our next scheduled appointment.  Your next care management appointment is by telephone on 09/18/24 at 1130    Please call the care guide team at [phone] if you need to cancel, schedule, or reschedule an appointment.   Please call the Suicide and Crisis Lifeline: 988 call the [place]  National Suicide Prevention Lifeline: 769-171-9827 or TTY: (843) 766-5447 TTY 402-140-6263) to talk to a trained counselor call 1-800-273-TALK (toll free, 24 hour hotline) call 911 if you are experiencing a Mental Health or Behavioral Health Crisis or need someone to talk to.  Thersia Hoar, HEDWIG, MHA [place]  [facility] Social Worker, Population Health [phone]

## 2024-09-04 NOTE — Patient Outreach (Signed)
 Social Drivers of Health  Community Resource and Care Coordination Visit Note   09/04/2024  Name: Michael Payne MRN: [id number] DOB:02/12/1971  Situation: Referral received for [facility] needs assessment and assistance related to Transportation [place] application. I obtained verbal consent from Patient.  Visit completed with Patient on the phone.   Background:      Assessment:   Goals Addressed             This Visit's Progress    BSW Goals       .Current SDOH Barriers:  Transportation utilities  Interventions: SW provided patient with resources for utilities via email on file.  Patient has already received resources for food pantries. Part B of [place] application has been sent to PCP for completion SW resent resources via email for utilities. Patient states he receieved a letter from [employer] requesting part B, Patient sent in.  Thersia Hoar, BSW, MHA [place]  [facility] Social Worker, Population Health [phone]           Recommendation:   Contact [employer] to follow up on Part B.  Follow Up Plan:   Telephone follow-up 09/18/24 at 1130am  Thersia Hoar, BSW, [facility]  Value Based [facility] Social Worker, Population Health [phone]

## 2024-09-05 ENCOUNTER — Telehealth: Payer: Self-pay

## 2024-09-05 NOTE — Telephone Encounter (Signed)
 Relayed message to patient about getting cardiac clearance.

## 2024-09-16 ENCOUNTER — Telehealth (HOSPITAL_BASED_OUTPATIENT_CLINIC_OR_DEPARTMENT_OTHER): Payer: Self-pay | Admitting: *Deleted

## 2024-09-16 NOTE — Telephone Encounter (Signed)
" ° °  Name: Michael Payne  DOB: 1971/07/26  MRN: [id number]  Primary Cardiologist: None  Chart reviewed as part of pre-operative protocol coverage. Because of Michael Payne's past medical history and time since last visit, he will require a follow-up in-office visit in order to better assess preoperative cardiovascular risk. Has not been seen in the office since 07/26/2021 by Dr. Alveta. Procedure date is TBD.  Pre-op covering staff: - Please schedule appointment and call patient to inform them. If patient already had an upcoming appointment within acceptable timeframe, please add pre-op clearance to the appointment notes so provider is aware. - Please contact requesting surgeon's office via preferred method (i.e, phone, fax) to inform them of need for appointment prior to surgery.    Lamarr Satterfield, NP  09/16/2024, 3:34 PM   "

## 2024-09-16 NOTE — Telephone Encounter (Signed)
 I will reach out to pt with a new pt appt with [facility] ok per preop APP Lamarr Satterfield, DNP.   I s/w the pt and he has been scheduled to see [facility] Hails, [facility] 09/18/24 @ [address]. Pt has been given [address] address.   I will update all parties involved.

## 2024-09-16 NOTE — Telephone Encounter (Signed)
"  ° °  Pre-operative Risk Assessment    Patient Name: Michael Payne  DOB: July 06, 1971 MRN: [id number]   Date of last office visit: 06/15/22 DAMIEN BRAVER, NP Date of next office visit: NONE   Request for Surgical Clearance    Procedure:  HERNIA SURGERY   Date of Surgery:  Clearance TBD                                Surgeon:  DR. PAUL STECHESCHULTE Surgeon's Group or Practice Name:  [facility] Phone number:  [phone] Fax number:  [phone] HOLLIE TROY, CMA   Type of Clearance Requested:   - Medical    Type of Anesthesia:  General    Additional requests/questions:    [name]   09/16/2024, 3:26 PM   "

## 2024-09-16 NOTE — Telephone Encounter (Signed)
 She may have [facility] appointment

## 2024-09-16 NOTE — Telephone Encounter (Signed)
 Will confirm with preop APP if pt will need new pt appt with MD or if [facility] provider.

## 2024-09-17 NOTE — Progress Notes (Unsigned)
 " Cardiology Office Note:    Date:  09/18/2024   ID:  Michael Payne, DOB [birth year], MRN [id number]  PCP:  Cleatus Arlyss RAMAN, MD   [facility] Providers Cardiologist:  Emeline FORBES Calender, DO     Referring MD: Cleatus Arlyss RAMAN, MD   Chief Complaint  Patient presents with   Pre-op Exam    History of Present Illness:    Michael Payne is a 53 y.o. male with a hx of HFpEF, hypertension, prediabetes, OSA, obesity, and GERD.   Last seen by Dr. Alveta 2022. He was admitted to the hospital September 2022 with acute hypercarbic respiratory failure.  He was bedbound and had not walked in the prior 2 years.  He was massively volume overloaded and underwent IV diuresis of 26 L.  Echo 2022 demonstrated LVEF 60 to 65%, moderate LVH, no significant valvular disease but moderate dilatation of the aortic root measuring 44 mm and moderate dilatation of the ascending aorta measuring 43 mm.  He is now treated with BiPAP for OSA.  When last seen, preoperative risk evaluation was completed prior to bariatric surgery.  Unfortunately he was bedbound and additional stress testing or functional testing was contraindicated due to morbid obesity.  He was recommended to proceed with surgery at moderate risk.  He is now pending hernia surgery and we are asked for preoperative risk evaluation.   He had gastric sleeve 2 years ago and lost weight from 625 lb to 450 lbs. He has recently gain 20-30 lbs through stress eating, was down to to 428 lbs. He goes to the gym 5 days per week, with water aerobics and arm weights. He is controlling his diet. He can walk 0.5 miles to the bus stop, takes about 14 minutes.     Past Medical History:  Diagnosis Date   Anemia    borderline anemia, was instructed to eat more iron filled foods   Cardiac enlargement 06/03/2022   Noted on Xray   CHF (congestive heart failure) (HCC) 05/2021   GERD (gastroesophageal reflux disease)    lactose intolerance, patient denies  symptoms of reflux   Hip pain    History of kidney stones    HTN (hypertension)    Morbid obesity (HCC)    Obesity    Pneumonia 05/2021   Pre-diabetes    Sleep apnea     Past Surgical History:  Procedure Laterality Date   FOOT SURGERY Right    KNEE ARTHROSCOPY WITH MEDIAL MENISECTOMY Left 12/16/2014   Procedure: KNEE ARTHROSCOPY WITH MEDIAL MENISECTOMY;  Surgeon: Evalene JONETTA Chancy, MD;  Location: [facility] OR;  Service: Orthopedics;  Laterality: Left;   UPPER GI ENDOSCOPY N/A 06/20/2022   Procedure: UPPER GI ENDOSCOPY;  Surgeon: Lyndel Deward PARAS, MD;  Location: [facility];  Service: General;  Laterality: N/A;    Current Medications: Active Medications[1]   Allergies:   Lisinopril , Aspirin , and Lactose intolerance (gi)   Social History   Socioeconomic History   Marital status: Legally Separated    Spouse name: Not on file   Number of children: 2   Years of education: Not on file   Highest education level: Not on file  Occupational History   Occupation: disabled  Tobacco Use   Smoking status: Former    Current packs/day: 0.00    Average packs/day: 2.0 packs/day for 22.0 years (44.0 ttl pk-yrs)    Types: Cigarettes    Start date: 02/23/1989    Quit date: 02/24/2011  Years since quitting: 13.5   Smokeless tobacco: Never  Vaping Use   Vaping status: Never Used  Substance and Sexual Activity   Alcohol use: Not Currently   Drug use: Not Currently    Types: Marijuana    Comment: former -  quit in 2012   Sexual activity: Yes    Partners: Female  Other Topics Concern   Not on file  Social History Narrative   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   Former hydrologist.   Divorced.    [employer] fan.     Social Drivers of Health   Tobacco Use: Medium Risk (09/18/2024)   Patient History    Smoking Tobacco Use: Former    Smokeless Tobacco Use: Never    Passive Exposure: Not on file  Financial Resource Strain: Low Risk (08/30/2024)   Overall Financial Resource  Strain (CARDIA)    Difficulty of Paying Living Expenses: Not very hard  Food Insecurity: No Food Insecurity (08/30/2024)   Epic    Worried About Programme Researcher, Broadcasting/film/video in the Last Year: Never true    Ran Out of Food in the Last Year: Never true  Transportation Needs: Unmet Transportation Needs (08/23/2024)   Epic    Lack of Transportation (Medical): Yes    Lack of Transportation (Non-Medical): No  Physical Activity: Sufficiently Active (03/26/2024)   Exercise Vital Sign    Days of Exercise per Week: 5 days    Minutes of Exercise per Session: 50 min  Stress: No Stress Concern Present (03/26/2024)   [employer] of Occupational Health - Occupational Stress Questionnaire    Feeling of Stress: Not at all  Social Connections: Moderately Integrated (03/26/2024)   Social Connection and Isolation Panel    Frequency of Communication with Friends and Family: More than three times a week    Frequency of Social Gatherings with Friends and Family: More than three times a week    Attends Religious Services: More than 4 times per year    Active Member of Clubs or Organizations: Yes    Attends Banker Meetings: More than 4 times per year    Marital Status: Divorced  Depression (PHQ2-9): Low Risk (07/18/2024)   Depression (PHQ2-9)    PHQ-2 Score: 0  Alcohol Screen: Low Risk (03/26/2024)   Alcohol Screen    Last Alcohol Screening Score (AUDIT): 0  Housing: Low Risk (08/30/2024)   Epic    Unable to Pay for Housing in the Last Year: No    Number of Times Moved in the Last Year: 0    Homeless in the Last Year: No  Utilities: Not At Risk (08/30/2024)   Epic    Threatened with loss of utilities: No  Health Literacy: Adequate Health Literacy (03/26/2024)   B1300 Health Literacy    Frequency of need for help with medical instructions: Never     Family History: The patient's family history includes CAD in his maternal grandfather; Cancer in his father; Colon cancer in his maternal uncle; Heart  attack (age of onset: 50) in his mother; Lung cancer in his father. There is no history of Prostate cancer.  ROS:   Please see the history of present illness.     All other systems reviewed and are negative.  EKGs/Labs/Other Studies Reviewed:    The following studies were reviewed today:  EKG Interpretation Date/Time:  Wednesday September 18 2024 10:57:49 EST Ventricular Rate:  89 PR Interval:  194 QRS Duration:  100 QT Interval:  370 QTC Calculation: 450 R Axis:   11  Text Interpretation: Normal sinus rhythm with sinus arrhythmia Normal ECG When compared with ECG of 03-Jun-2022 11:42, No significant change was found Confirmed by Madie Slough ([id number]) on 09/18/2024 11:38:39 AM    Recent Labs: 03/26/2024: ALT 12; BUN 11; Creatinine, Ser 0.86; Hemoglobin 13.2; Platelets 213.0; Potassium 4.4; Sodium 143; TSH 1.43  Recent Lipid Panel    Component Value Date/Time   CHOL 147 03/26/2024 1221   TRIG 138.0 03/26/2024 1221   HDL 49.20 03/26/2024 1221   CHOLHDL 3 03/26/2024 1221   VLDL 27.6 03/26/2024 1221   LDLCALC 70 03/26/2024 1221   LDLDIRECT 76.0 10/29/2018 1249     Risk Assessment/Calculations:                Physical Exam:    VS:  BP 125/68 (BP Location: Left Arm, Patient Position: Sitting, Cuff Size: Large)   Pulse 85   Resp 12   Wt (!) 449 lb (203.7 kg)   SpO2 96%   BMI 57.65 kg/m     Wt Readings from Last 3 Encounters:  09/18/24 (!) 449 lb (203.7 kg)  07/18/24 (!) 434 lb 8 oz (197.1 kg)  03/26/24 (!) 437 lb (198.2 kg)     GEN:  obese male in NAD HEENT: Normal NECK: No JVD; No carotid bruits LYMPHATICS: No lymphadenopathy CARDIAC: RRR, no murmurs, rubs, gallops RESPIRATORY:  Clear to auscultation without rales, wheezing or rhonchi  ABDOMEN: Soft, non-tender, non-distended MUSCULOSKELETAL:  No edema; No deformity  SKIN: Warm and dry NEUROLOGIC:  Alert and oriented x 3 PSYCHIATRIC:  Normal affect   ASSESSMENT:    1. Pre-op evaluation   2. Ascending  aorta dilation   3. Dilatation of annulus of aortic valve   4. Morbid obesity (HCC)    PLAN:    In order of problems listed above:  Chronic diastolic heart failure Hypertension -Continue 10 mg amlodipine , 3.125 mg carvedilol  twice daily -- no symptoms of CHF, no recent hospitalizations   Dilatation of aortic valve Dilatation of ascending aorta - repeat echo    Morbid obesity OSA on CPAP - trying to lose weight - intermittently wearing CPAP - refer back to pulmonology   Preoperative risk evaluation prior to hernia surgery Previously felt to be moderate to high risk for any surgery given morbid obesity and bedbound status.  Functional testing difficult given weight of 449 lbs. However, can now achieve 4.0 METS without angina. No recent HFpEF hospitalizations. Overall doing much better.   Will repeat echo for ascending aorta and root dilation. If measurements are stable, will proceed with surgery.    Follow up with Dr. Kriste in 2 months.             Medication Adjustments/Labs and Tests Ordered: Current medicines are reviewed at length with the patient today.  Concerns regarding medicines are outlined above.  Orders Placed This Encounter  Procedures   EKG 12-Lead   ECHOCARDIOGRAM COMPLETE   No orders of the defined types were placed in this encounter.   Patient Instructions  Medication Instructions:  Your physician recommends that you continue on your current medications as directed. Please refer to the Current Medication list given to you today.  *If you need a refill on your cardiac medications before your next appointment, please call your pharmacy*  Testing/Procedures: Your physician has requested that you have an echocardiogram. Echocardiography is a painless test that uses sound waves to create images of your heart. It provides  your doctor with information about the size and shape of your heart and how well your hearts chambers and valves are working. This  procedure takes approximately one hour. There are no restrictions for this procedure. Please do NOT wear cologne, perfume, aftershave, or lotions (deodorant is allowed). Please arrive 15 minutes prior to your appointment time.  Please note: We ask at that you not bring children with you during ultrasound (echo/ vascular) testing. Due to room size and safety concerns, children are not allowed in the ultrasound rooms during exams. Our front office staff cannot provide observation of children in our lobby area while testing is being conducted. An adult accompanying a patient to their appointment will only be allowed in the ultrasound room at the discretion of the ultrasound technician under special circumstances. We apologize for any inconvenience.   Follow-Up: At [facility], you and your health needs are our priority.  As part of our continuing mission to provide you with exceptional heart care, our providers are all part of one team.  This team includes your primary Cardiologist (physician) and Advanced Practice Providers or APPs (Physician Assistants and Nurse Practitioners) who all work together to provide you with the care you need, when you need it.  Your next appointment:   2-3 month(s)  Provider:   Emeline FORBES Calender, DO               Signed, Jon Nat Hails, [place]  09/18/2024 2:09 PM    [facility]     [1]  Current Meds  Medication Sig   allopurinol  (ZYLOPRIM ) 100 MG tablet Take 1 tablet (100 mg total) by mouth 2 (two) times a week.   amLODipine  (NORVASC ) 10 MG tablet Take 1 tablet (10 mg total) by mouth daily.   carvedilol  (COREG ) 3.125 MG tablet TAKE 1 TABLET TWICE DAILY WITH MEALS   Misc Natural Products (BEET ROOT PO) Take by mouth.   Multiple Vitamins-Minerals (MULTIVITAMIN WITH MINERALS) tablet Take 1 tablet by mouth daily.   naloxone  (NARCAN ) nasal spray 4 mg/0.1 mL Spray nostril if needed for opioid overuse- decreased consciousness, decreased  respirations   oxyCODONE -acetaminophen  (PERCOCET) 10-325 MG tablet Take 1-1.5 tablets by mouth every 6 (six) hours as needed for pain (Fill on or after 30 days from rx. max 5 tabs per day.).   vitamin B-12 (CYANOCOBALAMIN ) 50 MCG tablet Take 50 mcg by mouth daily.   vitamin C (ASCORBIC ACID) 500 MG tablet Take 500 mg by mouth daily.   "

## 2024-09-18 ENCOUNTER — Encounter: Payer: Self-pay | Admitting: Physician Assistant

## 2024-09-18 ENCOUNTER — Ambulatory Visit: Attending: Physician Assistant | Admitting: Physician Assistant

## 2024-09-18 ENCOUNTER — Other Ambulatory Visit: Payer: Self-pay

## 2024-09-18 VITALS — BP 125/68 | HR 85 | Resp 12 | Wt >= 6400 oz

## 2024-09-18 DIAGNOSIS — I77810 Thoracic aortic ectasia: Secondary | ICD-10-CM | POA: Diagnosis not present

## 2024-09-18 DIAGNOSIS — I358 Other nonrheumatic aortic valve disorders: Secondary | ICD-10-CM | POA: Diagnosis not present

## 2024-09-18 DIAGNOSIS — Z01818 Encounter for other preprocedural examination: Secondary | ICD-10-CM

## 2024-09-18 NOTE — Patient Instructions (Signed)
 Medication Instructions:  Your physician recommends that you continue on your current medications as directed. Please refer to the Current Medication list given to you today.  *If you need a refill on your cardiac medications before your next appointment, please call your pharmacy*  Testing/Procedures: Your physician has requested that you have an echocardiogram. Echocardiography is a painless test that uses sound waves to create images of your heart. It provides your doctor with information about the size and shape of your heart and how well your hearts chambers and valves are working. This procedure takes approximately one hour. There are no restrictions for this procedure. Please do NOT wear cologne, perfume, aftershave, or lotions (deodorant is allowed). Please arrive 15 minutes prior to your appointment time.  Please note: We ask at that you not bring children with you during ultrasound (echo/ vascular) testing. Due to room size and safety concerns, children are not allowed in the ultrasound rooms during exams. Our front office staff cannot provide observation of children in our lobby area while testing is being conducted. An adult accompanying a patient to their appointment will only be allowed in the ultrasound room at the discretion of the ultrasound technician under special circumstances. We apologize for any inconvenience.   Follow-Up: At [facility], you and your health needs are our priority.  As part of our continuing mission to provide you with exceptional heart care, our providers are all part of one team.  This team includes your primary Cardiologist (physician) and Advanced Practice Providers or APPs (Physician Assistants and Nurse Practitioners) who all work together to provide you with the care you need, when you need it.  Your next appointment:   2-3 month(s)  Provider:   Emeline FORBES Calender, DO

## 2024-09-18 NOTE — Patient Instructions (Signed)
 Visit Information  Thank you for taking time to visit with me today. Please don't hesitate to contact me if I can be of assistance to you before our next scheduled appointment.  Your next care management appointment is by telephone on 10/01/24 at 1130    Please call the care guide team at [phone] if you need to cancel, schedule, or reschedule an appointment.   Please call the Suicide and Crisis Lifeline: 988 call the [place]  National Suicide Prevention Lifeline: (214)192-0538 or TTY: (825)138-2094 TTY 276-648-3350) to talk to a trained counselor call 1-800-273-TALK (toll free, 24 hour hotline) call 911 if you are experiencing a Mental Health or Behavioral Health Crisis or need someone to talk to.  Thersia Hoar, HEDWIG, MHA [place]  [facility] Social Worker, Population Health [phone]

## 2024-09-18 NOTE — Patient Outreach (Signed)
 Social Drivers of Health  Community Resource and Care Coordination Visit Note   09/18/2024  Name: Michael Payne MRN: [id number] DOB:[birth year]  Situation: Referral received for [facility] needs assessment and assistance related to Transportation. I obtained verbal consent from Patient.  Visit completed with Patient on the phone.   Background:      Assessment:   Goals Addressed             This Visit's Progress    BSW Goals       .Current SDOH Barriers:  Transportation utilities  Interventions: SW provided patient with resources for utilities via email on file.  Patient has already received resources for food pantries. Part B of [employer] application has been sent to PCP for completion SW resent resources via email for utilities. Patient states he receieved a letter from [employer] requesting part B, Patient sent in. Patient is waiting for response from [employer].   Thersia Hoar, BSW, MHA   [facility] Social Worker, Population Health [phone]           Recommendation:   Wait for decision from [employer] on transportation.   Follow Up Plan:   Telephone follow-up 10/01/24 at 1130  Thersia Hoar, [place], [place] [facility]  [facility] Based [facility] Social Worker, Population Health [phone]

## 2024-09-23 ENCOUNTER — Other Ambulatory Visit: Payer: Self-pay | Admitting: Family Medicine

## 2024-09-23 NOTE — Telephone Encounter (Unsigned)
 Copied from CRM [id number]. Topic: Clinical - Medication Refill >> Sep 23, 2024 11:16 AM Thersia BROCKS wrote: Medication: oxyCODONE -acetaminophen  (PERCOCET) 10-325 MG tablet   Has the patient contacted their pharmacy? Yes (Agent: If no, request that the patient contact the pharmacy for the refill. If patient does not wish to contact the pharmacy document the reason why and proceed with request.) (Agent: If yes, when and what did the pharmacy advise?)  This is the patient's preferred pharmacy:  [facility] [phone] [address], [place] - [address] [place] [address] Phone: [phone] Fax: [phone]   Is this the correct pharmacy for this prescription? Yes If no, delete pharmacy and type the correct one.   Has the prescription been filled recently? No  Is the patient out of the medication? Yes  Has the patient been seen for an appointment in the last year OR does the patient have an upcoming appointment? Yes  Can we respond through MyChart? Yes  Agent: Please be advised that Rx refills may take up to 3 business days. We ask that you follow-up with your pharmacy.

## 2024-09-24 ENCOUNTER — Other Ambulatory Visit: Payer: Self-pay | Admitting: Family Medicine

## 2024-09-24 ENCOUNTER — Ambulatory Visit: Payer: Self-pay

## 2024-09-24 MED ORDER — OXYCODONE-ACETAMINOPHEN 10-325 MG PO TABS: 1.0000 | ORAL_TABLET | Freq: Four times a day (QID) | ORAL | 0 refills | Status: AC | PRN

## 2024-09-24 NOTE — Telephone Encounter (Signed)
 Requesting: Oxycodone -acetaminophen  (Percocet) 10-325 MG Tablet Contract: No UDS: 05/31/21 Last Visit: 07/18/2024 Next Visit: Visit date not found Last Refill: 06/23/24  Please advise

## 2024-09-24 NOTE — Telephone Encounter (Signed)
 Rx sent.  I didn't see request until this note came in.  Thanks.

## 2024-09-24 NOTE — Telephone Encounter (Signed)
 FYI Only or Action Required?: Action required by provider: Requesting refill of Oxycodone .  Patient was last seen in primary care on 07/18/2024 by Cleatus Arlyss RAMAN, MD.  Called Nurse Triage reporting Hip Pain.  Symptoms began 2 days ago.  Interventions attempted: OTC medications: tylenol .  Symptoms are: gradually worsening.  Triage Disposition: See PCP When Office is Open (Within 3 Days)  Patient/caregiver understands and will follow disposition?: No, wishes to speak with PCP   Copied from CRM #[id number]. Topic: Clinical - Red Word Triage >> Sep 24, 2024  2:12 PM Mesmerise C wrote: [facility] that prompted transfer to Nurse Triage: Patient has been without his oxyCODONE -acetaminophen  (PERCOCET) 10-325 MG tablet and has been in pain and has been getting worse needing it to be filled asap Reason for Disposition  [1] MODERATE pain (e.g., interferes with normal activities, limping) AND [2] present > 3 days  Answer Assessment - Initial Assessment Questions Requesting refill of pain medication   1. LOCATION and RADIATION: Where is the pain located? Does the pain spread (shoot) anywhere else?     hips 2. QUALITY: What does the pain feel like?  (e.g., sharp, dull, aching, burning)      3. SEVERITY: How bad is the pain? What does it keep you from doing?   (Scale 1-10; or mild, moderate, severe)     7/8 4. ONSET: When did the pain start? Does it come and go, or is it there all the time?     Ran out of pain med sunday  8. OTHER SYMPTOMS: Do you have any other symptoms? (e.g., back pain, pain shooting down leg,  fever, rash)     Denies numbness, tingling in legs or other new symptoms.  Protocols used: Hip Pain-A-AH

## 2024-09-24 NOTE — Telephone Encounter (Signed)
 Called patient reviewed all information and repeated back to me. Will call if any questions.  ? ?

## 2024-09-25 NOTE — Telephone Encounter (Signed)
 Sent 09/24/24.  Let me know if he can't get it filled.  Thanks.

## 2024-10-01 ENCOUNTER — Other Ambulatory Visit: Payer: Self-pay

## 2024-10-01 NOTE — Patient Instructions (Signed)
 Visit Information  Thank you for taking time to visit with me today. Please don't hesitate to contact me if I can be of assistance to you before our next scheduled appointment.  Your next care management appointment is by telephone on 10/18/24 at 1pm    Please call the care guide team at [phone] if you need to cancel, schedule, or reschedule an appointment.   Please call the Suicide and Crisis Lifeline: 988 call the [place]  National Suicide Prevention Lifeline: 416-549-3809 or TTY: (636)379-4822 TTY (208) 361-0962) to talk to a trained counselor call 1-800-273-TALK (toll free, 24 hour hotline) call 911 if you are experiencing a Mental Health or Behavioral Health Crisis or need someone to talk to.  Thersia Hoar, HEDWIG, MHA [place]  [facility] Social Worker, Population Health [phone]

## 2024-10-01 NOTE — Patient Outreach (Signed)
 Social Drivers of Health  Community Resource and Care Coordination Visit Note   10/01/2024  Name: Michael Payne MRN: [id number] DOB:24-Feb-1971  Situation: Referral received for [facility] needs assessment and assistance related to Transportation. I obtained verbal consent from Patient.  Visit completed with Patient on the phone.   Background:      Assessment:   Goals Addressed             This Visit's Progress    BSW Goals       .Current SDOH Barriers:  Transportation utilities  Interventions: SW provided patient with resources for utilities via email on file.  Patient has already received resources for food pantries. Part B of [employer] application has been sent to PCP for completion SW resent resources via email for utilities. Patient states he receieved a letter from [employer] requesting part B, Patient sent in. Patient is waiting for response from [employer].  Patient still waiting on response from [employer]. SW will reach out to PCP to check to see if part b was sent to [employer].  Thersia Hoar, HEDWIG, MHA [place]  [facility] Social Worker, Population Health [phone]           Recommendation:   Check with [employer] to check on status of application.   Follow Up Plan:   Telephone follow-up 10/18/24 at 1pm  Thersia Hoar, BSW, [facility]   Value Based [facility] Social Worker, Population Health [phone]

## 2024-10-09 ENCOUNTER — Other Ambulatory Visit: Payer: Self-pay | Admitting: Family Medicine

## 2024-10-18 ENCOUNTER — Other Ambulatory Visit: Payer: Self-pay

## 2024-10-18 NOTE — Patient Instructions (Signed)

## 2024-10-18 NOTE — Patient Outreach (Signed)
 Social Drivers of Health  Community Resource and Care Coordination Visit Note   10/18/2024  Name: Michael Payne MRN: [id number] DOB:1970/09/26  Situation: Referral received for [facility] needs assessment and assistance related to Transportation. I obtained verbal consent from Patient.  Visit completed with Patient on the phone.   Background:      Assessment:   Goals Addressed             This Visit's Progress    COMPLETED: BSW Goals       .Current SDOH Barriers:  Transportation utilities  Interventions: SW provided patient with resources for utilities via email on file.  Patient has already received resources for food pantries. Part B of [employer] application has been sent to PCP for completion SW resent resources via email for utilities. Patient states he receieved a letter from [employer] requesting part B, Patient sent in. Patient is waiting for response from [employer].  Patient still waiting on response from [employer]. SW will reach out to PCP to check to see if part b was sent to [employer] Patient states he has something in the mail from [employer] but is uable to get it due to the ice.   Thersia Hoar, HEDWIG, MHA [place]  [facility] Social Worker, Population Health [phone]           Recommendation:   call for transportation assistance at least one week before appointments  Follow Up Plan:   Patient has achieved all patient stated goals. [employer] will be closed. Patient has been provided contact information should new needs arise.   Thersia Hoar, HEDWIG, MHA   [facility] Social Worker, Population Health [phone]

## 2024-10-25 ENCOUNTER — Ambulatory Visit (HOSPITAL_COMMUNITY): Admission: RE | Admit: 2024-10-25

## 2024-10-25 DIAGNOSIS — I77810 Thoracic aortic ectasia: Secondary | ICD-10-CM

## 2024-10-25 DIAGNOSIS — I10 Essential (primary) hypertension: Secondary | ICD-10-CM

## 2024-10-25 LAB — ECHOCARDIOGRAM COMPLETE
Area-P 1/2: 3.44 cm2
S' Lateral: 3.7 cm

## 2024-10-25 MED ORDER — PERFLUTREN LIPID MICROSPHERE
1.0000 mL | INTRAVENOUS | Status: AC | PRN
  Administered 2024-10-25: 3 mL via INTRAVENOUS

## 2024-11-28 ENCOUNTER — Ambulatory Visit: Admitting: Internal Medicine

## 2025-03-27 ENCOUNTER — Ambulatory Visit
